# Patient Record
Sex: Male | Born: 1935 | Race: White | Hispanic: No | Smoking: Former smoker
Health system: Southern US, Community
[De-identification: ages and names within clinical notes are randomized; demographics above are authoritative.]

## PROBLEM LIST (undated history)

## (undated) DIAGNOSIS — F1020 Alcohol dependence, uncomplicated: Secondary | ICD-10-CM

## (undated) DIAGNOSIS — E860 Dehydration: Secondary | ICD-10-CM

## (undated) DIAGNOSIS — J449 Chronic obstructive pulmonary disease, unspecified: Secondary | ICD-10-CM

## (undated) DIAGNOSIS — I4719 Other supraventricular tachycardia: Secondary | ICD-10-CM

## (undated) DIAGNOSIS — T7840XA Allergy, unspecified, initial encounter: Secondary | ICD-10-CM

## (undated) DIAGNOSIS — I10 Essential (primary) hypertension: Secondary | ICD-10-CM

## (undated) DIAGNOSIS — G20 Parkinson's disease: Secondary | ICD-10-CM

## (undated) DIAGNOSIS — C61 Malignant neoplasm of prostate: Secondary | ICD-10-CM

## (undated) DIAGNOSIS — R296 Repeated falls: Secondary | ICD-10-CM

## (undated) DIAGNOSIS — M109 Gout, unspecified: Secondary | ICD-10-CM

## (undated) DIAGNOSIS — I951 Orthostatic hypotension: Secondary | ICD-10-CM

## (undated) DIAGNOSIS — G20A1 Parkinson's disease without dyskinesia, without mention of fluctuations: Secondary | ICD-10-CM

## (undated) DIAGNOSIS — M1990 Unspecified osteoarthritis, unspecified site: Secondary | ICD-10-CM

## (undated) DIAGNOSIS — E7800 Pure hypercholesterolemia, unspecified: Secondary | ICD-10-CM

## (undated) DIAGNOSIS — E46 Unspecified protein-calorie malnutrition: Secondary | ICD-10-CM

## (undated) DIAGNOSIS — H269 Unspecified cataract: Secondary | ICD-10-CM

## (undated) DIAGNOSIS — G621 Alcoholic polyneuropathy: Secondary | ICD-10-CM

## (undated) DIAGNOSIS — D649 Anemia, unspecified: Secondary | ICD-10-CM

## (undated) DIAGNOSIS — H3320 Serous retinal detachment, unspecified eye: Secondary | ICD-10-CM

## (undated) DIAGNOSIS — K4090 Unilateral inguinal hernia, without obstruction or gangrene, not specified as recurrent: Secondary | ICD-10-CM

## (undated) DIAGNOSIS — W19XXXA Unspecified fall, initial encounter: Secondary | ICD-10-CM

## (undated) DIAGNOSIS — I471 Supraventricular tachycardia: Secondary | ICD-10-CM

## (undated) HISTORY — DX: Supraventricular tachycardia: I47.1

## (undated) HISTORY — PX: COLONOSCOPY: SHX174

## (undated) HISTORY — DX: Gout, unspecified: M10.9

## (undated) HISTORY — PX: COLONOSCOPY W/ POLYPECTOMY: SHX1380

## (undated) HISTORY — DX: Unspecified cataract: H26.9

## (undated) HISTORY — DX: Unilateral inguinal hernia, without obstruction or gangrene, not specified as recurrent: K40.90

## (undated) HISTORY — DX: Parkinson's disease without dyskinesia, without mention of fluctuations: G20.A1

## (undated) HISTORY — DX: Other supraventricular tachycardia: I47.19

## (undated) HISTORY — PX: PROSTATE SURGERY: SHX751

## (undated) HISTORY — PX: HERNIA REPAIR: SHX51

## (undated) HISTORY — DX: Pure hypercholesterolemia, unspecified: E78.00

## (undated) HISTORY — DX: Unspecified osteoarthritis, unspecified site: M19.90

## (undated) HISTORY — DX: Chronic obstructive pulmonary disease, unspecified: J44.9

## (undated) HISTORY — DX: Essential (primary) hypertension: I10

## (undated) HISTORY — DX: Malignant neoplasm of prostate: C61

## (undated) HISTORY — DX: Parkinson's disease: G20

## (undated) HISTORY — DX: Serous retinal detachment, unspecified eye: H33.20

## (undated) HISTORY — DX: Allergy, unspecified, initial encounter: T78.40XA

## (undated) HISTORY — PX: CATARACT EXTRACTION: SUR2

---

## 2000-07-17 ENCOUNTER — Ambulatory Visit (HOSPITAL_COMMUNITY): Admission: RE | Admit: 2000-07-17 | Discharge: 2000-07-17 | Payer: Self-pay | Admitting: Gastroenterology

## 2000-07-17 ENCOUNTER — Encounter (INDEPENDENT_AMBULATORY_CARE_PROVIDER_SITE_OTHER): Payer: Self-pay

## 2001-06-16 ENCOUNTER — Encounter: Admission: RE | Admit: 2001-06-16 | Discharge: 2001-06-16 | Payer: Self-pay | Admitting: General Surgery

## 2001-06-16 ENCOUNTER — Encounter: Payer: Self-pay | Admitting: General Surgery

## 2001-06-18 ENCOUNTER — Ambulatory Visit (HOSPITAL_BASED_OUTPATIENT_CLINIC_OR_DEPARTMENT_OTHER): Admission: RE | Admit: 2001-06-18 | Discharge: 2001-06-18 | Payer: Self-pay | Admitting: General Surgery

## 2006-03-04 ENCOUNTER — Ambulatory Visit (HOSPITAL_COMMUNITY): Admission: RE | Admit: 2006-03-04 | Discharge: 2006-03-04 | Payer: Self-pay | Admitting: Ophthalmology

## 2008-02-11 ENCOUNTER — Encounter: Admission: RE | Admit: 2008-02-11 | Discharge: 2008-02-29 | Payer: Self-pay | Admitting: Family Medicine

## 2009-05-30 ENCOUNTER — Encounter: Admission: RE | Admit: 2009-05-30 | Discharge: 2009-05-30 | Payer: Self-pay | Admitting: Specialist

## 2009-08-17 ENCOUNTER — Encounter: Admission: RE | Admit: 2009-08-17 | Discharge: 2009-08-17 | Payer: Self-pay | Admitting: Family Medicine

## 2010-01-05 ENCOUNTER — Ambulatory Visit (HOSPITAL_BASED_OUTPATIENT_CLINIC_OR_DEPARTMENT_OTHER): Admission: RE | Admit: 2010-01-05 | Discharge: 2010-01-06 | Payer: Self-pay | Admitting: Specialist

## 2010-07-19 ENCOUNTER — Ambulatory Visit (HOSPITAL_COMMUNITY): Admission: RE | Admit: 2010-07-19 | Discharge: 2010-07-19 | Payer: Self-pay | Admitting: Gastroenterology

## 2011-02-07 LAB — POCT I-STAT, CHEM 8
BUN: 15 mg/dL (ref 6–23)
Chloride: 106 mEq/L (ref 96–112)
Potassium: 4.1 mEq/L (ref 3.5–5.1)
Sodium: 139 mEq/L (ref 135–145)

## 2011-04-05 NOTE — Op Note (Signed)
NAME:  Victor Clarke, Victor Clarke               ACCOUNT NO.:  [id number]   MEDICAL RECORD NO.:  [id number]          PATIENT TYPE:  AMB   LOCATION:  SDS                          FACILITY:  [facility]   PHYSICIAN:  Alford Highland. Rankin, M.D.   DATE OF BIRTH:  [birth year]   DATE OF PROCEDURE:  03/04/2006  DATE OF DISCHARGE:                                 OPERATIVE REPORT   PREOPERATIVE DIAGNOSIS:  Rhegmatogenous retinal detachment, right eye,  macula on.   POSTOPERATIVE DIAGNOSIS:  Rhegmatogenous retinal detachment, right eye,  macula on.   OPERATION PERFORMED:  1.  Scleral buckle using 287 solid silicone explant, right eye.  2.  Injection of vitreous substitute, SF6 100% concentration, volume 0.2 mL.  3.  External drainage of subretinal fluid, right eye.  4.  Cryopexy, right eye.  Repair of retinal detachment.   SURGEON:  Alford Highland. Rankin, M.D.   ANESTHESIA:  Local retrobulbar with monitored anesthesia control.   INDICATIONS FOR PROCEDURE:  The patient is a 77 year old man who has macula  on rhegmatogenous detachment who requires scleral buckle to repair.  The  patient understands the risks of anesthesia including the rare occurrence of  death but also understands risks to the eye both surgical and from the  healing and the underlying process including but not limited to hemorrhage,  infection, scarring, need for another surgery, no change in vision, loss of  vision and progression of disease despite intervention.  After appropriate  signed consent was obtained, the patient was taken to the operating room.   DESCRIPTION OF PROCEDURE:  In the operating room, appropriate monitoring was  followed by mild sedation.  2% Xylocaine with no epinephrine was injected in  retrobulbar fashion followed by an additional 5 mL laterally in the fashion  of modified Darel Hong.  The  periocular region was sterilely prepped and  draped in the usual ophthalmic fashion.  A lid speculum was applied.  Conjunctival peritomy was  fashioned from the 530 position temporally to the  12:30 position.  Three quadrants show each of the quadrants were then  entered to break the intermuscular septum.  The superior lateral and  inferior rectus muscles were isolated on 2-0 silk ties.  Retinal cryopexy  was applied to the retinal tear found at the 9 o'clock position with the  adjacent pigmentary changes.  Detachment extended from the 8 o'clock to the  10 o'clock position. Scleral buckle 287 solid silicone explant was then  selected to support the retinal break and the volume of the detachment.  This was placed and temporarily tied with 5-0 Mersilene mattress sutures  astride and underneath the lateral rectus muscle.  External drainage of  subretinal fluid was deemed necessary and this was carried out with direct  visualization via the head scope.  Subretinal fluid was removed in this  fashion without difficulty.  Intraocular pressure was maintained constantly.  Thereafter the buckle was then secured and tightened to the appropriate  height with permanent 5-0 Mersilene mattress sutures. Two were placed in  superotemporal quadrant, one in the inferotemporal quadrant.  A small fold  through  the area of the retinal break was noted.  It was indeed possible  that this might keep the break from flattening on the buckle.  For this  reason, it was necessary to use intraocular vitreous substitute--limited  amount to flatten this area.   Conjunctiva and Tenon's brought forward and closed with 7-0 Vicryl suture.  The bed of the buckle irrigated.  Subconjunctival injection of antibiotic  and steroid applied.  0.2 mL of filtered SF6 100% concentration was then  placed via the inferior quadrant in direct visualization. The optic nerve  remained perfused. The intraocular pressure was normal.   At this time the patient was placed in the left side down position.  The  patient tolerated the procedure well without complication.  A sterile  patch  and Fox shield were applied.  The patient was then transferred to PACU in  good and stable condition.      Alford Highland Rankin, M.D.  Electronically Signed     GAR/MEDQ  D:  03/04/2006  T:  03/05/2006  Job:  [id number]

## 2011-04-05 NOTE — Procedures (Signed)
[facility]  Patient:    Victor Clarke, Victor Clarke                        MRN: [id number] Proc. Date: 07/17/00 Attending:  Verlin Grills, M.D. CC:         Al Decant. Janey Greaser, M.D.   Procedure Report  PROCEDURE:  Colonoscopy and small colon biopsy.  REFERRING PHYSICIAN:  Al Decant. Janey Greaser, M.D.  INDICATION FOR PROCEDURE:  [name] is a 77 year old male referred for diagnostic colonoscopy to evaluate painless hematochezia which has resolved. In mid 1980, [name] tells me had had colon polyps removed. In 1996, his flexible proctosigmoidoscopy--barium enema examination was apparently normal.  [name] viewed our colonoscopy education film. I discussed with the patient the complications associated with colonoscopy and polypectomy including intestinal bleeding and intestinal perforation. The patient has signed the operative permit.  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 Boston Service in 1993.  ENDOSCOPIST:  Verlin Grills, M.D.  PREMEDICATION:  Demerol 50 mg, Versed 10 mg.  ENDOSCOPE:  Olympus pediatric colonoscope.  DESCRIPTION OF PROCEDURE:  After obtaining informed consent, the patient was placed in the left lateral decubitus position. I administered intravenous Demerol and intravenous Versed to achieve conscious sedation for the procedure. The patients cardiac rhythm, oxygen saturation and blood pressure were monitored throughout the procedure and documented in the medical record.  Anal inspection was normal. Digital rectal exam reveals an absent prostate. The Olympus pediatric video colonoscope was introduced into the rectum and under direct vision advanced to the cecum identified by a normal appearing ileocecal valve. Colonic preparation for the exam today was excellent.  RECTUM:  Large nonbleeding internal hemorrhoids.  SIGMOID COLON/DESCENDING COLON:   Normal.  SPLENIC FLEXURE:  Normal.  TRANSVERSE COLON:  Normal.  HEPATIC FLEXURE:  Normal.  ASCENDING COLON:  Normal.  CECUM/ILEOCECAL VALVE:  In the proximal cecum, a 1 mm sessile polyp was removed with the cold biopsy forceps and submitted for pathological interpretation.  ASSESSMENT: 1. Large nonbleeding internal hemorrhoids. 2. From the proximal cecum, a 1 mm sessile polyp was removed with the cold    biopsy forceps.  RECOMMENDATIONS:  If the cecal polyp returns neoplastic, [name] should undergo a repeat colonoscopy in 5 years; if the cecal polyp is nonneoplastic, [name] should undergo a repeat colonoscopy in 10 years. DD:  07/17/00 TD:  07/17/00 Job: [id number]

## 2011-04-05 NOTE — Op Note (Signed)
[facility]  Patient:    Victor Clarke, Victor Clarke                        MRN: [id number] Proc. Date: 06/18/01 Attending:  Anselm Pancoast. Zachery Dakins, M.D.                           Operative Report  PREOPERATIVE DIAGNOSIS:  Left inguinal hernia probably direct.  POSTOPERATIVE DIAGNOSIS:  Left inguinal hernia combination indirect and direct.  OPERATION:  SURGEON:  Anselm Pancoast. Zachery Dakins, M.D.  ANESTHESIA:  Local anesthesia with sedation.  HISTORY:  Victor Clarke is a 77 year old Caucasian male who I have cared for his wife, she is a dialysis patient for long term and he presented recently with a bulge in the left groin that has been increasing in size over the last couple of years.  The patient does a lot of lifting in caring for his wife and has had previous colonoscopy and because of the hernia increasing in size his medical physicians have recommended that he have it repaired.  PHYSICAL EXAMINATION:  On exam, he has got an obvious bulge about the size of an orange in the left groin.  DESCRIPTION OF PROCEDURE:  The patient was taken to the operative suite, an IV had been started and he was given a gram of Kefzol and then induction of sedation and then the area first was clipped prepped with Betadine surgical scrub solution and draped in a sterile manner.  The inguinal incision area was infiltrated with a mixture of 0.25% plain xylocaine and 0.25 Marcaine with adrenalin.  The ilioinguinal nerve areas anesthetized with a blunt 22 gauze needle.  In all total about 30 cc of the solution used.  Sharp dissection down through the skin, subcutaneous tissue.  Three superficial veins were clamped, divided and ligated with fine Vicryl and then the external oblique was opened through the external ring.  Had a difficult time, actually truly identifying the ilioinguinal nerve because the patient appears that he has an indirect component coming down with the cord  structures but then the floor had completely given away and over a period of time this has sort stretched and distorted the tissue significantly.  I first opened up the cord structures and separated out about a 4 inch well-developed hernia sac.  The preperitoneal fatty tissue and omentum that was down in this was reduced and then under direct vision, high sac ligation was performed with 0 Surgilon.  Next, the floor, I reinforced it with kind of a modified Shouldice type dissection.  The superior flap being sutured down into the shelving edge of the inguinal ligament and then resected running back, tying the two tails of the 2-0 Prolene together.  This recreated the internal ring and then a piece of Prolene mesh shaped like a sail slit laterally was used to reinforce the floor.  Suturing it, starting at the symphysis pubis with the inferior with continuous 2-0 Prolene and the superior flaps kind of interrupted sutures of 2-0 Prolene.  The two tails were then sutured together laterally to reinforce the internal ring.  I did not tighten up the internal ring real tight since he had had a combination, I did not want to get any swelling of the testicle. The ilioinguinal nerve and the iliohypogastric area, I think I protected both. Once was placed with the cord structures and the other was lying kind of superior  to the mesh.  The cord structures were placed back in their normal position and the external oblique was closed with 2-0 Vicryl and then 3-0 Vicryl in Scarpas and 3-0 Vicryl subcuticular and Benzoin and Steri-Strips on the skin.  The patient tolerated the procedure nicely and was awakened at the completion of surgery.  He has made arrangements for no lifting as far as his wife for the next four or five days and hopefully will be able to be her main caregiver next week.  I will see him in the office in one week and he is aware that he really should not be lifting over 30 to 40 pounds for the  next three to four weeks. DD:  06/18/01 TD:  06/18/01 Job: [id number]

## 2014-08-17 ENCOUNTER — Ambulatory Visit: Payer: Medicare Other | Admitting: Neurology

## 2014-08-18 ENCOUNTER — Encounter: Payer: Self-pay | Admitting: Neurology

## 2014-08-18 ENCOUNTER — Encounter (INDEPENDENT_AMBULATORY_CARE_PROVIDER_SITE_OTHER): Payer: Self-pay

## 2014-08-18 ENCOUNTER — Ambulatory Visit (INDEPENDENT_AMBULATORY_CARE_PROVIDER_SITE_OTHER): Payer: Medicare Other | Admitting: Neurology

## 2014-08-18 VITALS — BP 149/85 | HR 84 | Ht 69.0 in | Wt 149.4 lb

## 2014-08-18 DIAGNOSIS — G20A1 Parkinson's disease without dyskinesia, without mention of fluctuations: Secondary | ICD-10-CM

## 2014-08-18 DIAGNOSIS — H814 Vertigo of central origin: Secondary | ICD-10-CM

## 2014-08-18 DIAGNOSIS — R269 Unspecified abnormalities of gait and mobility: Secondary | ICD-10-CM

## 2014-08-18 DIAGNOSIS — R42 Dizziness and giddiness: Secondary | ICD-10-CM

## 2014-08-18 DIAGNOSIS — Z79899 Other long term (current) drug therapy: Secondary | ICD-10-CM

## 2014-08-18 DIAGNOSIS — G20 Parkinson's disease: Secondary | ICD-10-CM

## 2014-08-18 DIAGNOSIS — H8149 Vertigo of central origin, unspecified ear: Secondary | ICD-10-CM

## 2014-08-18 MED ORDER — CARBIDOPA-LEVODOPA 25-100 MG PO TABS: 0.5000 | ORAL_TABLET | Freq: Three times a day (TID) | ORAL | Status: DC

## 2014-08-18 NOTE — Patient Instructions (Addendum)
As far as your medications are concerned, I would like to suggest Sinemet (Carbidopa/Levodopa) 25/100 mg, one-half tablet  three times daily. Take at 8am, noon and 4pm.  Try to separate Sinemet from food (especially protein-rich foods like meat, dairy, eggs) by about 30-60 mins - this will help the absorption of the medication. If you have some nausea with the medication, you can take it with some light food like crackers or ginger ale.  There is an excellent Parkinson's support group in [place]. It is called "[employer] Parkinson[employer]" and meets once a month. Please call Amy Marriot or Vianne Bulls at [phone] for more information. There is no charge/fee for joining the group.   I would like to see you back in 6 weeks, sooner if we need to. Please call us with any interim questions, concerns, problems, updates or refill requests.   Please also call us for any test results so we can go over those with you on the phone.  My clinical assistant and will answer any of your questions and relay your messages to me and also relay most of my messages to you.   Our phone number is [phone]. We also have an after hours call service for urgent matters and there is a physician on-call for urgent questions. For any emergencies you know to call 911 or go to the nearest emergency room

## 2014-08-18 NOTE — Progress Notes (Addendum)
[facility]    Provider:  Dr Jaynee Eagles Referring Provider: No ref. provider found Primary Care Physician:  Milagros Evener, MD  CC:  Dizziness, balance difficulty  HPI:  Victor Clarke is a 80 y.o. male here as a referral from Dr. No ref. provider found for gait and balance difficulty.   Patient is a poor historian, can't really tell me why he is here. Review of referring physician's notes document possible parkinsonism. Patient reports dizziness. He is most dizzy in the morning, getting up in the morning he has to be very careful that he doesn't move around too quickly. Clears up immediately and ok for the rest of the day. Dizziness just when he gets up out of bed. Brief dizziness. Better if he sits and gets up slowly. No dizziness during the day, takes time getting up out of seats. No room spinning. No dry mouth or constipation. Not sleeping well but can't explain why. Denies snoring, not actng out dreams, tires more easily with physical activity, sometimes having difficulty getitng to sleep. No falls during the day. No numbness or tingling in the feet.  Denies falls, last time he fell was " a while ago" but can't tell me anymore. When asked about his tremor he states that the resting tremor started several months ago. He is having difficulty writing, getting shaky. sense of smell is ok.   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 Lives alone, no children and has been discussing his living situation with Dr. Haynes Dage regarding moving closer to his brother and into a retirement community. Cbc significant for MCV 103.6, CMP unremarkable,   Review of Systems: Patient complains of symptoms per HPI as well as the following symptoms: dizziness, denies CP and palpitations. Pertinent negatives per HPI. All others negative.   History   Social History   . Marital Status: Widowed    Spouse Name: N/A    Number of Children: 0  . Years of Education: College   Occupational History  . Retired Other   Social History Main Topics  . Smoking status: Former Smoker -- 2.00 packs/day for 22 years    Types: Cigarettes    Quit date: 11/18/1977  . Smokeless tobacco: Never Used  . Alcohol Use: Yes     Comment: 2 drinks daily  . Drug Use: No  . Sexual Activity: Not on file   Other Topics Concern  . Not on file   Social History Narrative   Patient lives at home alone.   Caffeine Use: 1 cup daily    Family History  Problem Relation Age of Onset  . Stroke Father   . Heart attack Mother     Past Medical History  Diagnosis Date  . Hypertension   . Hypercholesteremia   . Gout   . COPD (chronic obstructive pulmonary disease)   . Detached retina   . Prostate cancer     Past Surgical History  Procedure Laterality Date  . Colonoscopy w/ polypectomy  2001, 2006  . Hernia repair    . Prostate surgery      Current Outpatient Prescriptions  Medication Sig Dispense Refill  . allopurinol (ZYLOPRIM) 100 MG tablet Take 200 mg by mouth daily.      Marland Kitchen aspirin 81 MG tablet Take 81 mg by mouth daily.      Marland Kitchen atorvastatin (LIPITOR) 40 MG tablet  Take 40 mg by mouth daily.      Marland Kitchen lisinopril (PRINIVIL,ZESTRIL) 20 MG tablet Take 20 mg by mouth daily.      . meclizine (ANTIVERT) 25 MG tablet Take 25 mg by mouth every 8 (eight) hours as needed for dizziness.      . Omega-3 Fatty Acids (FISH OIL) 1200 MG CAPS Take 1 capsule by mouth daily.      . carbidopa-levodopa (SINEMET) 25-100 MG per tablet Take 0.5 tablets by mouth 3 (three) times daily.  45 tablet  3   No current facility-administered medications for this visit.    Allergies as of 08/18/2014  . (No Known Allergies)    Vitals:149/85 p84 standing Ht 5\' 9"  (1.753 m)  Wt 149 lb 6.4 oz (67.767 kg)  BMI 22.05 kg/m2 Last Weight:  Wt Readings from Last 1 Encounters:  08/18/14 149 lb 6.4 oz  (67.767 kg)   Last Height:   Ht Readings from Last 1 Encounters:  08/18/14 5\' 9"  (1.753 m)    Physical exam: Exam: Gen: NAD, conversant, well nourised, well groomed                     CV: RRR, no MRG. No Carotid Bruits. No peripheral edema, warm, nontender Eyes: Conjunctivae clear without exudates or hemorrhage  Neuro: Detailed Neurologic Exam  Speech:    Speech is normal; fluent and spontaneous with normal comprehension.  Cognition:    The patient is oriented to person, place, and time;  Cranial Nerves:    The pupils are equal, round, and reactive to light. Cannot visualize fundi. Visual fields are full to finger confrontation. Extraocular movements are intact. Trigeminal sensation is intact and the muscles of mastication are normal. The face is symmetric. The palate elevates in the midline. Voice is normal. Shoulder shrug is normal. The tongue has normal motion without fasciculations.   Coordination:    Normal finger to nose and heel to shin. Good RAM bilaterally, finger tap. Difficulty with tandem.  Gait:    Good stride, turn. Mildly reduced arm swing.  Motor Observation:    Left > right high-amplitude low-frequency resting and postural tremor Tone:    Left arm and hand cogwheeling, increased with facilitation  Posture:    Posture is normal.     Strength:    Strength is V/V in the upper and lower limbs.      Sensation: intact to LT     Reflex Exam:  DTR's:    Brisk throughout Toes:    The toes are downgoing bilaterally.   Clonus:    Clonus is absent.       Assessment/Plan:  80 year old male who is referred from his physician for balance and gait evaluation. He is a poor historian and can't tell me in detail about his symptoms. He demonstrates a left>right high amlitude, low frequency resting and postural tremor tremor, masked facies, left arm cogwheeling which worsens with facilitation and brisk reflexes throughout. Most likely Parkinson's Disease. Will  start Sinemet 25/100 take 1/2 pill at 8am, noon and 4pm. Will have patient back in 6 weeks and can increase the medication if tolerating it well. Discussed common side effects and discussed trying to try to separate Sinemet from food (especially protein-rich foods like meat, dairy, eggs) by about 30-60 mins to help the absorption of the medication. If nausea with the medication, can take it with some light food like crackers or ginger ale. Would be helpful for patient to be  evaluated by physical therapy evaluation. There is an excellent Parkinson's support group in [place]. It is called "[employer]'s" and meets once a month. Please call Amy Marriot or Vianne Bulls at [phone] for more information. There is no charge/fee for joining the group.   Also with dizziness, worsening. Advised patient to wear compression stockings and gradually rise from a seated position. May be a component of Parkinson's disease.    10/23 addendum: Checked up on patient. Patient reports getting worse, worse dizziness and room spinning not associated with head turning or with being in bed or upon standing. Need to order MRI of the brain to rule out vertigo of central origin such as posterior circulation strokes. MRI of the brain ordered which will also be helpful in examining his cognitive decline.   Addendum 08/29/14: MMSE 24/30 with loss of following points, sent by primary care dr rankins.   Orientation: floor of building -1 Attention and calculation -1 Can't repeat 3 words to remember -3 Copy design -[place], MD  [facility] [address] [place], [place] [address]  Phone [phone] Fax [phone]

## 2014-08-21 ENCOUNTER — Encounter: Payer: Self-pay | Admitting: Neurology

## 2014-08-22 ENCOUNTER — Encounter: Payer: Self-pay | Admitting: Neurology

## 2014-08-22 DIAGNOSIS — G20 Parkinson's disease: Secondary | ICD-10-CM | POA: Insufficient documentation

## 2014-08-29 ENCOUNTER — Ambulatory Visit: Payer: Medicare Other | Attending: Neurology

## 2014-09-09 NOTE — Addendum Note (Signed)
Addended by: Sarina Ill B on: 09/09/2014 05:48 PM   Modules accepted: Orders

## 2014-09-16 ENCOUNTER — Inpatient Hospital Stay (HOSPITAL_COMMUNITY): Payer: Medicare Other

## 2014-09-16 ENCOUNTER — Encounter (HOSPITAL_COMMUNITY): Payer: Self-pay | Admitting: Emergency Medicine

## 2014-09-16 ENCOUNTER — Emergency Department (HOSPITAL_COMMUNITY): Payer: Medicare Other

## 2014-09-16 ENCOUNTER — Inpatient Hospital Stay (HOSPITAL_COMMUNITY)
Admission: EM | Admit: 2014-09-16 | Discharge: 2014-09-18 | DRG: 204 | Disposition: A | Discharge status: Home / Self Care | Payer: Medicare Other | Attending: Internal Medicine | Admitting: Internal Medicine

## 2014-09-16 DIAGNOSIS — R0602 Shortness of breath: Secondary | ICD-10-CM | POA: Diagnosis present

## 2014-09-16 DIAGNOSIS — R0600 Dyspnea, unspecified: Secondary | ICD-10-CM | POA: Diagnosis not present

## 2014-09-16 DIAGNOSIS — Z66 Do not resuscitate: Secondary | ICD-10-CM | POA: Diagnosis present

## 2014-09-16 DIAGNOSIS — G20 Parkinson's disease: Secondary | ICD-10-CM | POA: Diagnosis not present

## 2014-09-16 DIAGNOSIS — I10 Essential (primary) hypertension: Secondary | ICD-10-CM | POA: Diagnosis present

## 2014-09-16 DIAGNOSIS — Z79899 Other long term (current) drug therapy: Secondary | ICD-10-CM | POA: Diagnosis not present

## 2014-09-16 DIAGNOSIS — Z8546 Personal history of malignant neoplasm of prostate: Secondary | ICD-10-CM | POA: Diagnosis not present

## 2014-09-16 DIAGNOSIS — J449 Chronic obstructive pulmonary disease, unspecified: Secondary | ICD-10-CM | POA: Diagnosis present

## 2014-09-16 DIAGNOSIS — Z87891 Personal history of nicotine dependence: Secondary | ICD-10-CM | POA: Diagnosis not present

## 2014-09-16 DIAGNOSIS — E780 Pure hypercholesterolemia: Secondary | ICD-10-CM | POA: Diagnosis present

## 2014-09-16 DIAGNOSIS — M109 Gout, unspecified: Secondary | ICD-10-CM | POA: Diagnosis present

## 2014-09-16 DIAGNOSIS — H3320 Serous retinal detachment, unspecified eye: Secondary | ICD-10-CM | POA: Diagnosis not present

## 2014-09-16 DIAGNOSIS — G20C Parkinsonism, unspecified: Secondary | ICD-10-CM | POA: Diagnosis present

## 2014-09-16 DIAGNOSIS — R0609 Other forms of dyspnea: Secondary | ICD-10-CM | POA: Diagnosis present

## 2014-09-16 DIAGNOSIS — I4891 Unspecified atrial fibrillation: Secondary | ICD-10-CM | POA: Diagnosis not present

## 2014-09-16 DIAGNOSIS — R079 Chest pain, unspecified: Secondary | ICD-10-CM

## 2014-09-16 LAB — CBC
HEMATOCRIT: 39.2 % (ref 39.0–52.0)
Hemoglobin: 13.8 g/dL (ref 13.0–17.0)
MCH: 35.1 pg — AB (ref 26.0–34.0)
MCHC: 35.2 g/dL (ref 30.0–36.0)
MCV: 99.7 fL (ref 78.0–100.0)
PLATELETS: 177 10*3/uL (ref 150–400)
RBC: 3.93 MIL/uL — ABNORMAL LOW (ref 4.22–5.81)
RDW: 14.8 % (ref 11.5–15.5)
WBC: 4.9 10*3/uL (ref 4.0–10.5)

## 2014-09-16 LAB — BASIC METABOLIC PANEL
Anion gap: 22 — ABNORMAL HIGH (ref 5–15)
BUN: 17 mg/dL (ref 6–23)
CALCIUM: 9.2 mg/dL (ref 8.4–10.5)
CHLORIDE: 100 meq/L (ref 96–112)
CO2: 19 mEq/L (ref 19–32)
CREATININE: 1.22 mg/dL (ref 0.50–1.35)
GFR calc non Af Amer: 53 mL/min — ABNORMAL LOW (ref >90)
GFR, EST AFRICAN AMERICAN: 61 mL/min — AB (ref >90)
Glucose, Bld: 127 mg/dL — ABNORMAL HIGH (ref 70–99)
Potassium: 4.3 mEq/L (ref 3.7–5.3)
Sodium: 141 mEq/L (ref 137–147)

## 2014-09-16 LAB — I-STAT TROPONIN, ED
Troponin i, poc: 0.07 ng/mL (ref 0.00–0.08)
Troponin i, poc: 0.06 ng/mL (ref 0.00–0.08)

## 2014-09-16 LAB — D-DIMER, QUANTITATIVE: D-Dimer, Quant: 0.64 ug/mL-FEU — ABNORMAL HIGH (ref 0.00–0.48)

## 2014-09-16 LAB — PRO B NATRIURETIC PEPTIDE: Pro B Natriuretic peptide (BNP): 2391 pg/mL — ABNORMAL HIGH (ref 0–450)

## 2014-09-16 LAB — TROPONIN I

## 2014-09-16 MED ORDER — DOCUSATE SODIUM 100 MG PO CAPS
100.0000 mg | ORAL_CAPSULE | Freq: Two times a day (BID) | ORAL | Status: DC
  Administered 2014-09-16 – 2014-09-18 (×4): 100 mg via ORAL
  Filled 2014-09-16 (×5): qty 1

## 2014-09-16 MED ORDER — ACETAMINOPHEN 650 MG RE SUPP: 650.0000 mg | Freq: Four times a day (QID) | RECTAL | Status: DC | PRN

## 2014-09-16 MED ORDER — ONDANSETRON HCL 4 MG/2ML IJ SOLN: 4.0000 mg | Freq: Four times a day (QID) | INTRAMUSCULAR | Status: DC | PRN

## 2014-09-16 MED ORDER — ONDANSETRON HCL 4 MG PO TABS: 4.0000 mg | ORAL_TABLET | Freq: Four times a day (QID) | ORAL | Status: DC | PRN

## 2014-09-16 MED ORDER — SODIUM CHLORIDE 0.9 % IV SOLN
INTRAVENOUS | Status: AC
Start: 2014-09-16 — End: 2014-09-17
  Administered 2014-09-16: 23:00:00 via INTRAVENOUS

## 2014-09-16 MED ORDER — CARBIDOPA-LEVODOPA 25-100 MG PO TABS
0.5000 | ORAL_TABLET | Freq: Three times a day (TID) | ORAL | Status: DC
  Administered 2014-09-16 – 2014-09-18 (×5): 0.5 via ORAL
  Filled 2014-09-16 (×7): qty 0.5

## 2014-09-16 MED ORDER — ASPIRIN EC 81 MG PO TBEC
81.0000 mg | DELAYED_RELEASE_TABLET | Freq: Every day | ORAL | Status: DC
  Administered 2014-09-17 – 2014-09-18 (×2): 81 mg via ORAL
  Filled 2014-09-16 (×2): qty 1

## 2014-09-16 MED ORDER — LISINOPRIL 20 MG PO TABS
20.0000 mg | ORAL_TABLET | Freq: Every day | ORAL | Status: DC
  Administered 2014-09-16 – 2014-09-18 (×3): 20 mg via ORAL
  Filled 2014-09-16 (×3): qty 1

## 2014-09-16 MED ORDER — ACETAMINOPHEN 325 MG PO TABS: 650.0000 mg | ORAL_TABLET | Freq: Four times a day (QID) | ORAL | Status: DC | PRN

## 2014-09-16 MED ORDER — HYDROCODONE-ACETAMINOPHEN 5-325 MG PO TABS: 1.0000 | ORAL_TABLET | ORAL | Status: DC | PRN

## 2014-09-16 MED ORDER — ALLOPURINOL 100 MG PO TABS
100.0000 mg | ORAL_TABLET | Freq: Every day | ORAL | Status: DC
  Administered 2014-09-16 – 2014-09-18 (×3): 100 mg via ORAL
  Filled 2014-09-16 (×3): qty 1

## 2014-09-16 MED ORDER — MECLIZINE HCL 12.5 MG PO TABS
12.5000 mg | ORAL_TABLET | Freq: Three times a day (TID) | ORAL | Status: DC | PRN
  Filled 2014-09-16: qty 1

## 2014-09-16 MED ORDER — ENOXAPARIN SODIUM 40 MG/0.4ML ~~LOC~~ SOLN
40.0000 mg | SUBCUTANEOUS | Status: DC
  Administered 2014-09-16 – 2014-09-17 (×2): 40 mg via SUBCUTANEOUS
  Filled 2014-09-16 (×3): qty 0.4

## 2014-09-16 MED ORDER — ATORVASTATIN CALCIUM 40 MG PO TABS
40.0000 mg | ORAL_TABLET | Freq: Every day | ORAL | Status: DC
  Administered 2014-09-16 – 2014-09-18 (×3): 40 mg via ORAL
  Filled 2014-09-16 (×3): qty 1

## 2014-09-16 MED ORDER — METOPROLOL TARTRATE 12.5 MG HALF TABLET
12.5000 mg | ORAL_TABLET | Freq: Two times a day (BID) | ORAL | Status: DC
  Administered 2014-09-16 – 2014-09-18 (×4): 12.5 mg via ORAL
  Filled 2014-09-16 (×5): qty 1

## 2014-09-16 MED ORDER — IOHEXOL 350 MG/ML SOLN
100.0000 mL | Freq: Once | INTRAVENOUS | Status: AC | PRN
  Administered 2014-09-16: 100 mL via INTRAVENOUS

## 2014-09-16 NOTE — ED Notes (Signed)
Ordered [facility] diet

## 2014-09-16 NOTE — ED Notes (Signed)
Pt from [place] MD office for sob and dizziness. The dizziness he reports off and on for the past month and the SOB started this week. Pt denies any pain. Equal grips, denies any facial droop, gait abnormality.

## 2014-09-16 NOTE — Progress Notes (Signed)
Admitted pt to rm 3E08 from ED, oriented to room, call bell placed within reach, admission done, orders carried out. Will continue to monitor.  09/16/14 2102  Vitals  Temp 98.2 F (36.8 C)  Temp Source Oral  BP 133/79 mmHg  BP Location Left Arm  BP Method Automatic  Patient Position (if appropriate) Lying  Pulse Rate 92  Resp 20  Oxygen Therapy  SpO2 100 %  O2 Device Room Air  Height and Weight  Height 5\' 9"  (1.753 m)  Weight 63.9 kg (140 lb 14 oz)  Type of Scale Used Standing (A Scale)  Type of Weight Actual  BSA (Calculated - sq m) 1.76 sq meters  BMI (Calculated) 20.8  Weight in (lb) to have BMI = 25 168.9

## 2014-09-16 NOTE — ED Provider Notes (Signed)
CSN: [id number]     Arrival date & time 09/16/14  1412 History   First MD Initiated Contact with Patient 09/16/14 1514     Chief Complaint  Patient presents with  . Shortness of Breath  . Dizziness     (Consider location/radiation/quality/duration/timing/severity/associated sxs/prior Treatment) Patient is a 80 y.o. male presenting with shortness of breath and dizziness. The history is provided by the patient. No language interpreter was used.  Shortness of Breath Severity:  Moderate Onset quality:  Gradual Duration:  1 week Timing:  Intermittent Progression:  Waxing and waning Chronicity:  New Context: activity   Context: not URI   Relieved by:  Rest Worsened by:  Exertion Associated symptoms: no abdominal pain, no chest pain, no cough, no diaphoresis, no fever, no headaches, no hemoptysis, no PND, no sputum production, no syncope and no wheezing   Risk factors: no hx of cancer, no hx of PE/DVT, no prolonged immobilization and no tobacco use   Dizziness Quality:  Lightheadedness Severity:  Moderate Onset quality:  Sudden Duration:  3 weeks Timing:  Intermittent Progression:  Waxing and waning (worse with standing in AM) Chronicity:  New Context: standing up   Context: not with head movement and not with loss of consciousness   Relieved by:  Nothing Worsened by:  Standing up Ineffective treatments:  Medication (meclizine) Associated symptoms: shortness of breath   Associated symptoms: no blood in stool, no chest pain, no headaches, no nausea, no palpitations, no syncope, no vision changes and no weakness   Risk factors: new medications   Risk factors: no hx of stroke and no hx of vertigo     Past Medical History  Diagnosis Date  . Hypertension   . Hypercholesteremia   . Gout   . COPD (chronic obstructive pulmonary disease)   . Detached retina   . Prostate cancer    Past Surgical History  Procedure Laterality Date  . Colonoscopy w/ polypectomy  2001, 2006  .  Hernia repair    . Prostate surgery     Family History  Problem Relation Age of Onset  . Stroke Father   . Heart attack Mother    History  Substance Use Topics  . Smoking status: Former Smoker -- 2.00 packs/day for 22 years    Types: Cigarettes    Quit date: 11/18/1977  . Smokeless tobacco: Never Used  . Alcohol Use: Yes     Comment: 2 drinks daily    Review of Systems  Constitutional: Negative for fever and diaphoresis.  Eyes: Negative for visual disturbance.  Respiratory: Positive for shortness of breath. Negative for cough, hemoptysis, sputum production, chest tightness and wheezing.   Cardiovascular: Negative for chest pain, palpitations, leg swelling, syncope and PND.  Gastrointestinal: Negative for nausea, abdominal pain and blood in stool.  Musculoskeletal: Negative for gait problem.  Neurological: Positive for dizziness. Negative for seizures, syncope, facial asymmetry, speech difficulty, weakness and headaches.  Hematological: Does not bruise/bleed easily.  All other systems reviewed and are negative.     Allergies  Review of patient's allergies indicates no known allergies.  Home Medications   Prior to Admission medications   Medication Sig Start Date End Date Taking? Authorizing Provider  allopurinol (ZYLOPRIM) 100 MG tablet Take 100 mg by mouth daily.    Yes Historical Provider, MD  aspirin EC 81 MG tablet Take 81 mg by mouth daily.   Yes Historical Provider, MD  atorvastatin (LIPITOR) 40 MG tablet Take 40 mg by mouth daily.  Yes Historical Provider, MD  carbidopa-levodopa (SINEMET) 25-100 MG per tablet Take 0.5 tablets by mouth 3 (three) times daily. 08/18/14  Yes Melvenia Beam, MD  lisinopril (PRINIVIL,ZESTRIL) 20 MG tablet Take 20 mg by mouth daily.   Yes Historical Provider, MD  meclizine (ANTIVERT) 25 MG tablet Take 12.5 mg by mouth every 8 (eight) hours as needed for dizziness.    Yes Historical Provider, MD  Omega-3 Fatty Acids (FISH OIL) 1200 MG CAPS  Take 2,400 mg by mouth daily.   Yes Historical Provider, MD   BP 148/97  Pulse 93  Temp(Src) 98.3 F (36.8 C) (Oral)  Resp 18  Ht 5\' 9"  (1.753 m)  Wt 144 lb (65.318 kg)  BMI 21.26 kg/m2  SpO2 100% Physical Exam  Vitals reviewed. Constitutional: He appears well-developed and well-nourished. No distress.  HENT:  Head: Normocephalic.  Eyes: EOM are normal. Pupils are equal, round, and reactive to light.  Neck: No JVD present.  Cardiovascular: Normal rate, regular rhythm and normal heart sounds.   Pulmonary/Chest: Effort normal and breath sounds normal.  Abdominal: Soft. He exhibits no distension. There is no tenderness.  Musculoskeletal: He exhibits no edema.  Neurological: He has normal strength and normal reflexes. He displays tremor. No cranial nerve deficit or sensory deficit. He displays a negative Romberg sign. Gait normal. GCS eye subscore is 4. GCS verbal subscore is 5. GCS motor subscore is 6.  Skin: Skin is warm.    ED Course  Procedures (including critical care time) Labs Review Labs Reviewed  CBC - Abnormal; Notable for the following:    RBC 3.93 (*)    MCH 35.1 (*)    All other components within normal limits  BASIC METABOLIC PANEL  PRO B NATRIURETIC PEPTIDE  I-STAT TROPOININ, ED    Imaging Review Dg Chest 2 View  09/16/2014   CLINICAL DATA:  80 year old male with shortness of breath for several days with dizziness and chest pain. Initial encounter.  EXAM: CHEST  2 VIEW  COMPARISON:  1453 hr the same day [facility] physicians exam) and earlier.  FINDINGS: Stable large lung volumes. Stable cardiac size and mediastinal contours. Chronic increased interstitial markings. No pneumothorax, pulmonary edema, pleural effusion or acute pulmonary opacity. No acute osseous abnormality identified. Calcified atherosclerosis of the aorta.  IMPRESSION: Chronic lung disease.  No acute cardiopulmonary abnormality.   Electronically Signed   By: Lars Pinks M.D.   On: 09/16/2014 16:12    Ct Angio Chest Pe W/cm &/or Wo Cm  09/17/2014   CLINICAL DATA:  Shortness of breath.  Dyspnea.  EXAM: CT ANGIOGRAPHY CHEST WITH CONTRAST  TECHNIQUE: Multidetector CT imaging of the chest was performed using the standard protocol during bolus administration of intravenous contrast. Multiplanar CT image reconstructions and MIPs were obtained to evaluate the vascular anatomy.  CONTRAST:  156mL OMNIPAQUE IOHEXOL 350 MG/ML SOLN  COMPARISON:  Chest x-ray dated 09/16/2014  FINDINGS: There are no pulmonary emboli, infiltrates, or pleural effusions. Patient has extensive emphysematous disease as well as fairly severe interstitial disease at the lung bases.  Heart size is normal. There is left ventricular hypertrophy. Extensive coronary artery calcification.  The main pulmonary arteries are prominent with peripheral pruning of the vessels suggestive of of pulmonary arterial hypertension.  Visualized portion of the upper abdomen is normal.  Review of the MIP images confirms the above findings.  IMPRESSION: 1. No pulmonary emboli. 2. Fairly severe chronic interstitial and obstructive lung disease with probable pulmonary arterial hypertension, left ventricular hypertrophy, and  extensive coronary artery calcification.   Electronically Signed   By: Rozetta Nunnery M.D.   On: 09/17/2014 00:18     EKG Interpretation   Date/Time:  Friday September 16 2014 14:20:09 EDT Ventricular Rate:  95 PR Interval:  206 QRS Duration: 86 QT Interval:  352 QTC Calculation: 442 R Axis:   -76 Text Interpretation:  Sinus rhythm with occasional Premature ventricular  complexes Pulmonary disease pattern Left anterior fascicular block ST \\T \  T wave abnormality, consider inferior ischemia Abnormal ECG When compared  with ECG of 01/05/2010, ST \\T \ T wave abnormality is now Present concerning  for ischemia Premature ventricular complexes are now Present Confirmed by  [facility]  MD, DAVID ([id number]) on 09/16/2014 3:27:08 PM      MDM   Final  diagnoses:  Chest pain  Dyspnea on exertion  Essential hypertension  New onset a-fib  Parkinson disease  Dyspnea    80 y/o male with 3 wks lightheadedness on standing in AM and new exertional dyspnea x 1 wk. No chest pain, syncope, diaphoresis. No prior cardiac history. Recently started sinemet for Parkinson's by Neurology. Noted to have EKG changes at PCP today. Well appearing on exam with clear lung sounds and euvolemic appearance. EKG with T wave inversion in inferior leads without prior for comparison. Will need evaluation for cardiac etiology of symptoms.     Amparo Bristol, MD 09/17/14 1126

## 2014-09-16 NOTE — H&P (Signed)
PCP: Milagros Evener, MD    Chief Complaint:  Shortness of breath  HPI: Victor Clarke is a 80 y.o. male   has a past medical history of Hypertension; Hypercholesteremia; Gout; COPD (chronic obstructive pulmonary disease); Detached retina; and Prostate cancer.   Presented with  For the past 1 month patient have been light headed when he moves. For the past 1 week he has been more short of breath with minimal activity. Denies any chest pain. He was recently diagnosed with parkins disease and started on Sinemet. Patient presented to ER due to ongoing dyspnea which was sudden in onset. While being evaluated in emergency department noted short run of apparent A. fib with RVR which self resolved. Currently impossible MAT rhythm  Hospitalist was called for admission for dyspnea and arrhythmia  Review of Systems:    Pertinent positives include:  dyspnea on exertion,   Constitutional:  No weight loss, night sweats, Fevers, chills, fatigue, weight loss  HEENT:  No headaches, Difficulty swallowing,Tooth/dental problems,Sore throat,  No sneezing, itching, ear ache, nasal congestion, post nasal drip,  Cardio-vascular:  No chest pain, Orthopnea, PND, anasarca, dizziness, palpitations.no Bilateral lower extremity swelling  GI:  No heartburn, indigestion, abdominal pain, nausea, vomiting, diarrhea, change in bowel habits, loss of appetite, melena, blood in stool, hematemesis Resp:  no shortness of breath at rest. No No excess mucus, no productive cough, No non-productive cough, No coughing up of blood.No change in color of mucus.No wheezing. Skin:  no rash or lesions. No jaundice GU:  no dysuria, change in color of urine, no urgency or frequency. No straining to urinate.  No flank pain.  Musculoskeletal:  No joint pain or no joint swelling. No decreased range of motion. No back pain.  Psych:  No change in mood or affect. No depression or anxiety. No memory loss.  Neuro: no localizing  neurological complaints, no tingling, no weakness, no double vision, no gait abnormality, no slurred speech, no confusion  Otherwise ROS are negative except for above, 10 systems were reviewed  Past Medical History: Past Medical History  Diagnosis Date  . Hypertension   . Hypercholesteremia   . Gout   . COPD (chronic obstructive pulmonary disease)   . Detached retina   . Prostate cancer    Past Surgical History  Procedure Laterality Date  . Colonoscopy w/ polypectomy  2001, 2006  . Hernia repair    . Prostate surgery       Medications: Prior to Admission medications   Medication Sig Start Date End Date Taking? Authorizing Provider  allopurinol (ZYLOPRIM) 100 MG tablet Take 100 mg by mouth daily.    Yes Historical Provider, MD  aspirin EC 81 MG tablet Take 81 mg by mouth daily.   Yes Historical Provider, MD  atorvastatin (LIPITOR) 40 MG tablet Take 40 mg by mouth daily.   Yes Historical Provider, MD  carbidopa-levodopa (SINEMET) 25-100 MG per tablet Take 0.5 tablets by mouth 3 (three) times daily. 08/18/14  Yes Melvenia Beam, MD  lisinopril (PRINIVIL,ZESTRIL) 20 MG tablet Take 20 mg by mouth daily.   Yes Historical Provider, MD  meclizine (ANTIVERT) 25 MG tablet Take 12.5 mg by mouth every 8 (eight) hours as needed for dizziness.    Yes Historical Provider, MD  Omega-3 Fatty Acids (FISH OIL) 1200 MG CAPS Take 2,400 mg by mouth daily.   Yes Historical Provider, MD    Allergies:  No Known Allergies  Social History:  Ambulatory  independently   Lives at  home alone,         reports that he quit smoking about 36 years ago. His smoking use included Cigarettes. He has a 44 pack-year smoking history. He has never used smokeless tobacco. He reports that he drinks alcohol. He reports that he does not use illicit drugs.    Family History: family history includes Heart attack in his mother; Stroke in his father.    Physical Exam: Patient Vitals for the past 24 hrs:  BP Temp  Temp src Pulse Resp SpO2 Height Weight  09/16/14 1845 151/89 mmHg - - 91 18 98 % - -  09/16/14 1830 144/88 mmHg - - 91 16 99 % - -  09/16/14 1815 147/98 mmHg - - 89 20 99 % - -  09/16/14 1800 148/99 mmHg - - 91 14 99 % - -  09/16/14 1745 152/94 mmHg - - 92 18 100 % - -  09/16/14 1730 146/91 mmHg - - 92 21 99 % - -  09/16/14 1700 145/94 mmHg - - 91 18 99 % - -  09/16/14 1630 147/92 mmHg - - 91 19 99 % - -  09/16/14 1620 - - - 91 10 99 % - -  09/16/14 1619 154/96 mmHg - - 81 - 100 % - -  09/16/14 1530 146/93 mmHg - - 96 17 99 % - -  09/16/14 1500 144/102 mmHg - - 92 25 100 % - -  09/16/14 1444 - - - 93 18 100 % - -  09/16/14 1443 148/97 mmHg 98.3 F (36.8 C) Oral 93 16 100 % - -  09/16/14 1441 148/97 mmHg - - - - - - -  09/16/14 1424 141/93 mmHg 97.6 F (36.4 C) Oral 94 20 99 % 5\' 9"  (1.753 m) 65.318 kg (144 lb)    1. General:  in No Acute distress 2. Psychological: Alert and   Oriented 3. Head/ENT:   Moist   Mucous Membranes                          Head Non traumatic, neck supple                          Normal   Dentition 4. SKIN:  decreased Skin turgor,  Skin clean Dry and intact no rash 5. Heart: Regular rate and rhythm no Murmur, Rub or gallop 6. Lungs: Somewhat distant no wheezes or crackles   7. Abdomen: Soft, non-tender, Non distended 8. Lower extremities: no clubbing, cyanosis, or edema 9. Neurologically Grossly intact, moving all 4 extremities equally 10. MSK: Normal range of motion  body mass index is 21.26 kg/(m^2).   Labs on Admission:   Results for orders placed during the hospital encounter of 09/16/14 (from the past 24 hour(s))  CBC     Status: Abnormal   Collection Time    09/16/14  2:31 PM      Result Value Ref Range   WBC 4.9  4.0 - 10.5 K/uL   RBC 3.93 (*) 4.22 - 5.81 MIL/uL   Hemoglobin 13.8  13.0 - 17.0 g/dL   HCT 39.2  39.0 - 52.0 %   MCV 99.7  78.0 - 100.0 fL   MCH 35.1 (*) 26.0 - 34.0 pg   MCHC 35.2  30.0 - 36.0 g/dL   RDW 14.8  11.5 - 15.5 %    Platelets 177  150 - 400 K/uL  BASIC METABOLIC  PANEL     Status: Abnormal   Collection Time    09/16/14  2:31 PM      Result Value Ref Range   Sodium 141  137 - 147 mEq/L   Potassium 4.3  3.7 - 5.3 mEq/L   Chloride 100  96 - 112 mEq/L   CO2 19  19 - 32 mEq/L   Glucose, Bld 127 (*) 70 - 99 mg/dL   BUN 17  6 - 23 mg/dL   Creatinine, Ser 1.22  0.50 - 1.35 mg/dL   Calcium 9.2  8.4 - 10.5 mg/dL   GFR calc non Af Amer 53 (*) >90 mL/min   GFR calc Af Amer 61 (*) >90 mL/min   Anion gap 22 (*) 5 - 15  PRO B NATRIURETIC PEPTIDE     Status: Abnormal   Collection Time    09/16/14  2:31 PM      Result Value Ref Range   Pro B Natriuretic peptide (BNP) 2391.0 (*) 0 - 450 pg/mL  I-STAT TROPOININ, ED     Status: None   Collection Time    09/16/14  2:39 PM      Result Value Ref Range   Troponin i, poc 0.07  0.00 - 0.08 ng/mL   Comment 3           I-STAT TROPOININ, ED     Status: None   Collection Time    09/16/14  6:14 PM      Result Value Ref Range   Troponin i, poc 0.06  0.00 - 0.08 ng/mL   Comment 3               No results found for this basename: HGBA1C    Estimated Creatinine Clearance: 42.4 ml/min (by C-G formula based on Cr of 1.22).  BNP (last 3 results)  Recent Labs  09/16/14 1431  PROBNP 2391.0*    Other results:  I have pearsonaly reviewed this: ECG REPORT  Rate 91  Rhythm: possible MAT ST&T Change: No ischemic changes   Filed Weights   09/16/14 1424  Weight: 65.318 kg (144 lb)     Cultures: No results found for this basename: sdes, specrequest, cult, reptstatus     Radiological Exams on Admission: Dg Chest 2 View  09/16/2014   CLINICAL DATA:  80 year old male with shortness of breath for several days with dizziness and chest pain. Initial encounter.  EXAM: CHEST  2 VIEW  COMPARISON:  1453 hr the same day [facility] physicians exam) and earlier.  FINDINGS: Stable large lung volumes. Stable cardiac size and mediastinal contours. Chronic increased  interstitial markings. No pneumothorax, pulmonary edema, pleural effusion or acute pulmonary opacity. No acute osseous abnormality identified. Calcified atherosclerosis of the aorta.  IMPRESSION: Chronic lung disease.  No acute cardiopulmonary abnormality.   Electronically Signed   By: Lars Pinks M.D.   On: 09/16/2014 16:12    Chart has been reviewed  Assessment/Plan  80 year old gentleman with history of hypertension new onset Parkinson disease presents with sudden dyspnea on exertion and abnormal heart rhythm worrisome for MAT versus paroxysmal A. fib.  Present on Admission:  . Dyspnea on exertion - we'll admit to telemetry,  cycle cardiac enzymes,  obtain echogram, even sudden onset will check CT angio chest  . Parkinson disease continue Sinemet  . Hypertension - continue home medications will add beta blocker given potential round of atrial fibrillation  . New onset a-fib - this was a short episode not captured on EKG.  We'll continue to monitor on telemetry. Obtain echogram cycle cardiac enzymes. At this point per cardiology will hold off on anticoagulation given the atrial fibrillation was not confirmed. Patient's Chad2Vasc score is 3 to monitor on telemetry. Cardiology consult in a.m.   Prophylaxis:   Lovenox, Protonix  CODE STATUS: DNR/DNI as per patient  Other plan as per orders.  I have spent a total of 65 min on this admission except and was taken to discuss Patient care with cardiology  [place] 09/16/2014, 7:53 PM  [facility]  Pager [phone]   after 2 AM please page floor coverage PA If 7AM-7PM, please contact the day team taking care of the patient  [email or web address]  Password [email or web address]

## 2014-09-16 NOTE — ED Provider Notes (Signed)
Atrial male comes in with several-day history of dyspnea. Dyspnea is exertional and not related to body position. He denies chest pain. On exam, lungs are clear and heart is regular rate and rhythm. He has no edema. BNP is significantly elevated. He has no history of prior heart failure. He will be admitted for evaluation of new-onset CHF.  I saw and evaluated the patient, reviewed the resident's note and I agree with the findings and plan.   EKG Interpretation   Date/Time:  Friday September 16 2014 14:20:09 EDT Ventricular Rate:  95 PR Interval:  206 QRS Duration: 86 QT Interval:  352 QTC Calculation: 442 R Axis:   -76 Text Interpretation:  Sinus rhythm with occasional Premature ventricular  complexes Pulmonary disease pattern Left anterior fascicular block ST \\T \  T wave abnormality, consider inferior ischemia Abnormal ECG When compared  with ECG of 01/05/2010, ST \\T \ T wave abnormality is now Present concerning  for ischemia Premature ventricular complexes are now Present Confirmed by  Roxanne Mins  MD, Jelisha Weed ([id number]) on 09/16/2014 3:27:08 PM        Delora Fuel, MD 27/25/36 6440

## 2014-09-16 NOTE — ED Notes (Signed)
Patient transported to X-ray 

## 2014-09-17 DIAGNOSIS — I517 Cardiomegaly: Secondary | ICD-10-CM

## 2014-09-17 DIAGNOSIS — R0609 Other forms of dyspnea: Secondary | ICD-10-CM

## 2014-09-17 DIAGNOSIS — I10 Essential (primary) hypertension: Secondary | ICD-10-CM

## 2014-09-17 LAB — CBC
HCT: 31.2 % — ABNORMAL LOW (ref 39.0–52.0)
Hemoglobin: 11 g/dL — ABNORMAL LOW (ref 13.0–17.0)
MCH: 35.1 pg — ABNORMAL HIGH (ref 26.0–34.0)
MCHC: 35.3 g/dL (ref 30.0–36.0)
MCV: 99.7 fL (ref 78.0–100.0)
Platelets: 162 10*3/uL (ref 150–400)
RBC: 3.13 MIL/uL — AB (ref 4.22–5.81)
RDW: 14.8 % (ref 11.5–15.5)
WBC: 4.1 10*3/uL (ref 4.0–10.5)

## 2014-09-17 LAB — URINALYSIS, ROUTINE W REFLEX MICROSCOPIC
BILIRUBIN URINE: NEGATIVE
Glucose, UA: NEGATIVE mg/dL
Hgb urine dipstick: NEGATIVE
KETONES UR: 15 mg/dL — AB
Leukocytes, UA: NEGATIVE
NITRITE: NEGATIVE
PH: 5.5 (ref 5.0–8.0)
Protein, ur: NEGATIVE mg/dL
Specific Gravity, Urine: >1.046 — ABNORMAL HIGH (ref 1.005–1.030)
UROBILINOGEN UA: 0.2 mg/dL (ref 0.0–1.0)

## 2014-09-17 LAB — HEMOGLOBIN A1C
HEMOGLOBIN A1C: 5.3 % (ref <5.7)
Mean Plasma Glucose: 105 mg/dL (ref <117)

## 2014-09-17 LAB — COMPREHENSIVE METABOLIC PANEL
ALT: 8 U/L (ref 0–53)
ANION GAP: 15 (ref 5–15)
AST: 38 U/L — ABNORMAL HIGH (ref 0–37)
Albumin: 3 g/dL — ABNORMAL LOW (ref 3.5–5.2)
Alkaline Phosphatase: 38 U/L — ABNORMAL LOW (ref 39–117)
BUN: 20 mg/dL (ref 6–23)
CALCIUM: 8.1 mg/dL — AB (ref 8.4–10.5)
CO2: 22 mEq/L (ref 19–32)
Chloride: 99 mEq/L (ref 96–112)
Creatinine, Ser: 1.14 mg/dL (ref 0.50–1.35)
GFR calc non Af Amer: 58 mL/min — ABNORMAL LOW (ref >90)
GFR, EST AFRICAN AMERICAN: 67 mL/min — AB (ref >90)
GLUCOSE: 92 mg/dL (ref 70–99)
Potassium: 4.1 mEq/L (ref 3.7–5.3)
Sodium: 136 mEq/L — ABNORMAL LOW (ref 137–147)
TOTAL PROTEIN: 5.8 g/dL — AB (ref 6.0–8.3)
Total Bilirubin: 1.1 mg/dL (ref 0.3–1.2)

## 2014-09-17 LAB — MAGNESIUM: MAGNESIUM: 1.1 mg/dL — AB (ref 1.5–2.5)

## 2014-09-17 LAB — TROPONIN I: Troponin I: <0.3 ng/mL (ref <0.30)

## 2014-09-17 LAB — TSH: TSH: 2.31 u[IU]/mL (ref 0.350–4.500)

## 2014-09-17 LAB — PHOSPHORUS: Phosphorus: 3.6 mg/dL (ref 2.3–4.6)

## 2014-09-17 MED ORDER — SODIUM CHLORIDE 0.9 % IV SOLN
INTRAVENOUS | Status: AC
  Administered 2014-09-17 (×2): via INTRAVENOUS

## 2014-09-17 NOTE — Progress Notes (Signed)
Echocardiogram 2D Echocardiogram has been performed.  Victor Clarke 09/17/2014, 11:29 AM

## 2014-09-17 NOTE — Progress Notes (Signed)
[facility] PROGRESS NOTE  Assessment/Plan: Dyspnea on exertion: - Unclear etiology, the patient is a poor historian. He doesn't have JVD or pedal jugular reflux, he does have crackles on his lungs bilaterally that are not clear with cough - Continue to monitor on telemetry, cardiac enzymes negative till date. - CT angiogram of the chest was negative for PE some arterial pulmonary hypertension. - 2-D echo is pending.  Essential Hypertension - BP currently stable.  Parkinson disease - Continue Sinemet.    Code Status: DNR/DNI Family Communication: none  Disposition Plan: obervation   Consultants:  none  Procedures:  Ct angio chest  Antibiotics:  None  HPI/Subjective: Poor historian, wants to go home.  Objective: Filed Vitals:   09/16/14 1946 09/16/14 2000 09/16/14 2102 09/17/14 0641  BP:  155/94 133/79 124/75  Pulse:  94 92 65  Temp:   98.2 F (36.8 C) 98.1 F (36.7 C)  TempSrc:   Oral Oral  Resp:  19 20 20   Height:   5\' 9"  (1.753 m)   Weight:   63.9 kg (140 lb 14 oz) 64.9 kg (143 lb 1.3 oz)  SpO2: 98% 94% 100% 99%    Intake/Output Summary (Last 24 hours) at 09/17/14 0728 Last data filed at 09/17/14 0600  Gross per 24 hour  Intake    545 ml  Output    100 ml  Net    445 ml   Filed Weights   09/16/14 1424 09/16/14 2102 09/17/14 0641  Weight: 65.318 kg (144 lb) 63.9 kg (140 lb 14 oz) 64.9 kg (143 lb 1.3 oz)    Exam:  General: Alert, awake, oriented x3, in no acute distress.  HEENT: No bruits, no goiter. Negative JVD Heart: Regular rate and rhythm. Lungs: Good air movement, crackles bilaterally Abdomen: Soft, nontender, nondistended, positive bowel sounds.  Neuro: Grossly intact, nonfocal.   Data Reviewed: Basic Metabolic Panel:  Recent Labs Lab 09/16/14 1431 09/17/14 0259  NA 141 136*  K 4.3 4.1  CL 100 99  CO2 19 22  GLUCOSE 127* 92  BUN 17 20  CREATININE 1.22 1.14  CALCIUM 9.2 8.1*  MG  --  1.1*  PHOS  --  3.6    Liver Function Tests:  Recent Labs Lab 09/17/14 0259  AST 38*  ALT 8  ALKPHOS 38*  BILITOT 1.1  PROT 5.8*  ALBUMIN 3.0*   No results found for this basename: LIPASE, AMYLASE,  in the last 168 hours No results found for this basename: AMMONIA,  in the last 168 hours CBC:  Recent Labs Lab 09/16/14 1431 09/17/14 0259  WBC 4.9 4.1  HGB 13.8 11.0*  HCT 39.2 31.2*  MCV 99.7 99.7  PLT 177 162   Cardiac Enzymes:  Recent Labs Lab 09/16/14 2224 09/17/14 0259  TROPONINI <0.30 <0.30   BNP (last 3 results)  Recent Labs  09/16/14 1431  PROBNP 2391.0*   CBG: No results found for this basename: GLUCAP,  in the last 168 hours  No results found for this or any previous visit (from the past 240 hour(s)).   Studies: Dg Chest 2 View  09/16/2014   CLINICAL DATA:  80 year old male with shortness of breath for several days with dizziness and chest pain. Initial encounter.  EXAM: CHEST  2 VIEW  COMPARISON:  1453 hr the same day [facility] physicians exam) and earlier.  FINDINGS: Stable large lung volumes. Stable cardiac size and mediastinal contours. Chronic increased interstitial markings. No pneumothorax, pulmonary edema, pleural effusion or  acute pulmonary opacity. No acute osseous abnormality identified. Calcified atherosclerosis of the aorta.  IMPRESSION: Chronic lung disease.  No acute cardiopulmonary abnormality.   Electronically Signed   By: Lars Pinks M.D.   On: 09/16/2014 16:12   Ct Angio Chest Pe W/cm &/or Wo Cm  09/17/2014   CLINICAL DATA:  Shortness of breath.  Dyspnea.  EXAM: CT ANGIOGRAPHY CHEST WITH CONTRAST  TECHNIQUE: Multidetector CT imaging of the chest was performed using the standard protocol during bolus administration of intravenous contrast. Multiplanar CT image reconstructions and MIPs were obtained to evaluate the vascular anatomy.  CONTRAST:  164mL OMNIPAQUE IOHEXOL 350 MG/ML SOLN  COMPARISON:  Chest x-ray dated 09/16/2014  FINDINGS: There are no pulmonary  emboli, infiltrates, or pleural effusions. Patient has extensive emphysematous disease as well as fairly severe interstitial disease at the lung bases.  Heart size is normal. There is left ventricular hypertrophy. Extensive coronary artery calcification.  The main pulmonary arteries are prominent with peripheral pruning of the vessels suggestive of of pulmonary arterial hypertension.  Visualized portion of the upper abdomen is normal.  Review of the MIP images confirms the above findings.  IMPRESSION: 1. No pulmonary emboli. 2. Fairly severe chronic interstitial and obstructive lung disease with probable pulmonary arterial hypertension, left ventricular hypertrophy, and extensive coronary artery calcification.   Electronically Signed   By: Rozetta Nunnery M.D.   On: 09/17/2014 00:18    Scheduled Meds: . allopurinol  100 mg Oral Daily  . aspirin EC  81 mg Oral Daily  . atorvastatin  40 mg Oral Daily  . carbidopa-levodopa  0.5 tablet Oral TID  . docusate sodium  100 mg Oral BID  . enoxaparin (LOVENOX) injection  40 mg Subcutaneous Q24H  . lisinopril  20 mg Oral Daily  . metoprolol tartrate  12.5 mg Oral BID   Continuous Infusions:    Charlynne Cousins  [facility] Pager [phone]. If 8PM-8AM, please contact night-coverage at [email or web address], password [facility] 09/17/2014, 7:28 AM  LOS: 1 day

## 2014-09-18 DIAGNOSIS — G20 Parkinson's disease: Secondary | ICD-10-CM

## 2014-09-18 MED ORDER — METOPROLOL TARTRATE 12.5 MG HALF TABLET: 12.5000 mg | ORAL_TABLET | Freq: Two times a day (BID) | ORAL | Status: DC

## 2014-09-18 NOTE — Discharge Summary (Signed)
Physician Discharge Summary  Victor Clarke ESP:[id number] DOB: 08-30-1929 DOA: 09/16/2014  PCP: Milagros Evener, MD  Admit date: 09/16/2014 Discharge date: 09/18/2014  Time spent: 74minutes  Recommendations for Outpatient Follow-up:  1. Follow up with cardiology as an outpatient 2-4 weeks.  Discharge Diagnoses:  Active Problems:   Parkinson disease   Dyspnea on exertion   Hypertension   Discharge Condition: Stable  Diet recommendation: heart healthy  Filed Weights   09/16/14 2102 09/17/14 0641 09/18/14 0500  Weight: 63.9 kg (140 lb 14 oz) 64.9 kg (143 lb 1.3 oz) 64.9 kg (143 lb 1.3 oz)    History of present illness:  80 y.o. male has a past medical history of Hypertension; Hypercholesteremia; Gout; COPD (chronic obstructive pulmonary disease); Detached retina; and Prostate cancer. Presented with For the past 1 month patient have been light headed when he moves. For the past 1 week he has been more short of breath with minimal activity. Denies any chest pain. He was recently diagnosed with parkins disease and started on Sinemet. Patient presented to ER due to ongoing dyspnea which was sudden in onset. While being evaluated in emergency department noted short run of apparent A. fib with RVR which self resolved. Currently impossible MAT rhythm  Hospital Course:  Dyspnea on exertion: - Unclear etiology, the patient is a poor historian. He doesn't have JVD or pedal jugular reflux. - No events on telemetry, 2 12 leads EKG no change. - Continue to monitor on telemetry, cardiac enzymes negative till date. - CT angiogram of the chest was negative for PE some arterial pulmonary hypertension. - 2-D echo as below  Essential Hypertension - BP currently stable.  Parkinson disease - Continue Sinemet.  Procedures:  ECHo 11.1.2015: There was moderate concentric hypertrophy. Systolic function was normal. The estimated ejection fraction was in the range of 55% to 60%. Wall motion  was normal; there were no regional wall motion abnormalities. Left ventricular diastolic function parameters were normal.  Aortic valve: Valve area (Vmax): 2.42 cm^2. - Mitral valve: Calcified annulus. - Left atrium: The atrium was mildly dilated.  Ct angi chest no PE.  Consultations:  none  Discharge Exam: Filed Vitals:   09/18/14 0942  BP: 108/77  Pulse: 74  Temp:   Resp:     General: A&O x3 Cardiovascular: RRR Respiratory: good air movement CTA B/L  Discharge Instructions You were cared for by a hospitalist during your hospital stay. If you have any questions about your discharge medications or the care you received while you were in the hospital after you are discharged, you can call the unit and asked to speak with the hospitalist on call if the hospitalist that took care of you is not available. Once you are discharged, your primary care physician will handle any further medical issues. Please note that NO REFILLS for any discharge medications will be authorized once you are discharged, as it is imperative that you return to your primary care physician (or establish a relationship with a primary care physician if you do not have one) for your aftercare needs so that they can reassess your need for medications and monitor your lab values.  Discharge Instructions    Diet - low sodium heart healthy    Complete by:  As directed      Increase activity slowly    Complete by:  As directed           Current Discharge Medication List    START taking these medications   Details  metoprolol tartrate (LOPRESSOR) 12.5 mg TABS tablet Take 0.5 tablets (12.5 mg total) by mouth 2 (two) times daily. Qty: 30 tablet, Refills: 0      CONTINUE these medications which have NOT CHANGED   Details  allopurinol (ZYLOPRIM) 100 MG tablet Take 100 mg by mouth daily.     aspirin EC 81 MG tablet Take 81 mg by mouth daily.    atorvastatin (LIPITOR) 40 MG tablet Take 40 mg by mouth daily.     carbidopa-levodopa (SINEMET) 25-100 MG per tablet Take 0.5 tablets by mouth 3 (three) times daily. Qty: 45 tablet, Refills: 3    lisinopril (PRINIVIL,ZESTRIL) 20 MG tablet Take 20 mg by mouth daily.    meclizine (ANTIVERT) 25 MG tablet Take 12.5 mg by mouth every 8 (eight) hours as needed for dizziness.     Omega-3 Fatty Acids (FISH OIL) 1200 MG CAPS Take 2,400 mg by mouth daily.       No Known Allergies Follow-up Information    Follow up with [facility] In 1 week.   Why:  hospital follow up   Contact information:   [place] [address] [phone]       The results of significant diagnostics from this hospitalization (including imaging, microbiology, ancillary and laboratory) are listed below for reference.    Significant Diagnostic Studies: Dg Chest 2 View  09/16/2014   CLINICAL DATA:  80 year old male with shortness of breath for several days with dizziness and chest pain. Initial encounter.  EXAM: CHEST  2 VIEW  COMPARISON:  1453 hr the same day [facility] physicians exam) and earlier.  FINDINGS: Stable large lung volumes. Stable cardiac size and mediastinal contours. Chronic increased interstitial markings. No pneumothorax, pulmonary edema, pleural effusion or acute pulmonary opacity. No acute osseous abnormality identified. Calcified atherosclerosis of the aorta.  IMPRESSION: Chronic lung disease.  No acute cardiopulmonary abnormality.   Electronically Signed   By: Lars Pinks M.D.   On: 09/16/2014 16:12   Ct Angio Chest Pe W/cm &/or Wo Cm  09/17/2014   CLINICAL DATA:  Shortness of breath.  Dyspnea.  EXAM: CT ANGIOGRAPHY CHEST WITH CONTRAST  TECHNIQUE: Multidetector CT imaging of the chest was performed using the standard protocol during bolus administration of intravenous contrast. Multiplanar CT image reconstructions and MIPs were obtained to evaluate the vascular anatomy.  CONTRAST:  11mL  OMNIPAQUE IOHEXOL 350 MG/ML SOLN  COMPARISON:  Chest x-ray dated 09/16/2014  FINDINGS: There are no pulmonary emboli, infiltrates, or pleural effusions. Patient has extensive emphysematous disease as well as fairly severe interstitial disease at the lung bases.  Heart size is normal. There is left ventricular hypertrophy. Extensive coronary artery calcification.  The main pulmonary arteries are prominent with peripheral pruning of the vessels suggestive of of pulmonary arterial hypertension.  Visualized portion of the upper abdomen is normal.  Review of the MIP images confirms the above findings.  IMPRESSION: 1. No pulmonary emboli. 2. Fairly severe chronic interstitial and obstructive lung disease with probable pulmonary arterial hypertension, left ventricular hypertrophy, and extensive coronary artery calcification.   Electronically Signed   By: Rozetta Nunnery M.D.   On: 09/17/2014 00:18    Microbiology: No results found for this or any previous visit (from the past 240 hour(s)).   Labs: Basic Metabolic Panel:  Recent Labs Lab 09/16/14 1431 09/17/14 0259  NA 141 136*  K 4.3 4.1  CL 100 99  CO2 19 22  GLUCOSE 127* 92  BUN 17 20  CREATININE 1.22 1.14  CALCIUM 9.2 8.1*  MG  --  1.1*  PHOS  --  3.6   Liver Function Tests:  Recent Labs Lab 09/17/14 0259  AST 38*  ALT 8  ALKPHOS 38*  BILITOT 1.1  PROT 5.8*  ALBUMIN 3.0*   No results for input(s): LIPASE, AMYLASE in the last 168 hours. No results for input(s): AMMONIA in the last 168 hours. CBC:  Recent Labs Lab 09/16/14 1431 09/17/14 0259  WBC 4.9 4.1  HGB 13.8 11.0*  HCT 39.2 31.2*  MCV 99.7 99.7  PLT 177 162   Cardiac Enzymes:  Recent Labs Lab 09/16/14 2224 09/17/14 0259 09/17/14 0950  TROPONINI <0.30 <0.30 <0.30   BNP: BNP (last 3 results)  Recent Labs  09/16/14 1431  PROBNP 2391.0*   CBG: No results for input(s): GLUCAP in the last 168 hours.     Signed:  Charlynne Cousins  [facility] 09/18/2014, 10:13 AM

## 2014-09-18 NOTE — Progress Notes (Signed)
Patient alert oriented, no c/o pain, shotrness of breath  or dizziness, v/s stable. Tele, iv d/c, d/c instruction and prescibtion given to patient. Patient verbalized understanding, secretary will call patient tomorrow for cardiologist appointment. Patient d/c per order.

## 2014-09-28 ENCOUNTER — Ambulatory Visit: Payer: Medicare Other | Admitting: Cardiology

## 2014-09-29 ENCOUNTER — Encounter: Payer: Self-pay | Admitting: Neurology

## 2014-09-29 ENCOUNTER — Ambulatory Visit (INDEPENDENT_AMBULATORY_CARE_PROVIDER_SITE_OTHER): Payer: Medicare Other | Admitting: Neurology

## 2014-09-29 ENCOUNTER — Ambulatory Visit (INDEPENDENT_AMBULATORY_CARE_PROVIDER_SITE_OTHER): Payer: Medicare Other | Admitting: Cardiology

## 2014-09-29 ENCOUNTER — Encounter: Payer: Self-pay | Admitting: Cardiology

## 2014-09-29 VITALS — BP 139/96 | HR 175 | Ht 69.0 in | Wt 145.0 lb

## 2014-09-29 VITALS — BP 118/60 | HR 94 | Ht 69.0 in | Wt 144.2 lb

## 2014-09-29 DIAGNOSIS — E7800 Pure hypercholesterolemia, unspecified: Secondary | ICD-10-CM

## 2014-09-29 DIAGNOSIS — G20A1 Parkinson's disease without dyskinesia, without mention of fluctuations: Secondary | ICD-10-CM

## 2014-09-29 DIAGNOSIS — I4719 Other supraventricular tachycardia: Secondary | ICD-10-CM

## 2014-09-29 DIAGNOSIS — I471 Supraventricular tachycardia: Secondary | ICD-10-CM

## 2014-09-29 DIAGNOSIS — G20 Parkinson's disease: Secondary | ICD-10-CM

## 2014-09-29 DIAGNOSIS — R42 Dizziness and giddiness: Secondary | ICD-10-CM

## 2014-09-29 DIAGNOSIS — I10 Essential (primary) hypertension: Secondary | ICD-10-CM

## 2014-09-29 MED ORDER — DILTIAZEM HCL ER COATED BEADS 120 MG PO CP24: 120.0000 mg | ORAL_CAPSULE | Freq: Every day | ORAL | Status: DC

## 2014-09-29 NOTE — Progress Notes (Signed)
[facility]    Provider:  Dr Jaynee Eagles Referring Provider: Aretta Nip, MD Primary Care Physician:  Milagros Evener, MD  CC: Dizziness  HPI:  Victor Clarke is a 80 y.o. male here as a referral from Dr. Radene Ou for balance and gait disorder  Patient continues to have episodes of dizziness.  Worsening. He fell yesterday at home because he felt dizzy.  He was originally seen here in the office and displayed parkinsonian symptoms on clinical exam however sinemet did not help and he has stopped taking the Sinemet.And his chief complaint was dizziness, he is not concerned with gait or balance. The dizziness is worsening. He was standing at the counter at the kitchen yesterday and a dizzy spell came over him and he flopped down on the floor. Episode lasted 3 minutes.  He was clutching the counter just trying to stand. No loss of consciousness. The room was not spinning. Can't describe what he feels is dizzy. "I was just struggling to get up". He was conscious. Fell on his knees. He was SOB during the episode. He sat in a chair and then all of a sudden he felt better.   This morning he was dizzy but not enough to fall. He was at the counter at the kitchen standing making coffee. He lives alone in his home. Dizziness lasted less than a minute then resolved. No chest pain. He feels his heart racing occasionally. He is having SOB with the dizziness.  He says he is missing medication. He forgets. He misses his schedule of some of the drugs. Sometimes "I just don't think to take it". He has no family to help him. He doesn't want to live in an assisted living facility. Family is not near by, his brother is in [place] and niece lives in [place]. He was going to buy a pill organizer today as suggested by his niece. He has not taken his medication yet today. "I just didn't think about it" referring to taking his medication.  Reviewed notes, labs and imaging from outside  physicians, which showed; Patient was admitted on 10/30 for SOB and dizziness. While being evaluated in emergency department noted short run of apparent A. fib with RVR which self resolved. ECHo 11.1.2015: There was moderate concentric hypertrophy. Systolic function was normal. The estimated ejection fraction was in the range of 55% to 60%. Wall motion was normal; there were no regional wall motion abnormalities. Left ventricular diastolic function parameters were normal. Aortic valve: Valve area (Vmax): 2.42 cm^2. - Mitral valve: Calcified annulus. - Left atrium: The atrium was mildly dilated. Ct angi chest no PE. No events on telemetry, 2 12 leads EKG no change.  Consultations:  Review of Systems: Patient complains of symptoms per HPI as well as the following symptoms: Denies CP. Pertinent negatives per HPI. All others negative.   History   Social History  . Marital Status: Widowed    Spouse Name: N/A    Number of Children: 0  . Years of Education: College   Occupational History  . Retired Other   Social History Main Topics  . Smoking status: Former Smoker -- 2.00 packs/day for 22 years    Types: Cigarettes    Quit date: 11/18/1977  . Smokeless tobacco: Never Used  . Alcohol Use: 0.5 oz/week    1 drink(s) per week     Comment: 2 drinks daily  . Drug Use: No  . Sexual Activity: No   Other Topics  Concern  . Not on file   Social History Narrative   Patient lives at home alone.   Caffeine Use: 1 cup daily    Family History  Problem Relation Age of Onset  . Stroke Father   . Heart attack Mother     Past Medical History  Diagnosis Date  . Hypertension   . Hypercholesteremia   . Gout   . COPD (chronic obstructive pulmonary disease)   . Detached retina   . Prostate cancer     Past Surgical History  Procedure Laterality Date  . Colonoscopy w/ polypectomy  2001, 2006  . Hernia repair    . Prostate surgery      Current Outpatient Prescriptions    Medication Sig Dispense Refill  . allopurinol (ZYLOPRIM) 100 MG tablet Take 100 mg by mouth daily.     Marland Kitchen aspirin EC 81 MG tablet Take 81 mg by mouth daily.    Marland Kitchen atorvastatin (LIPITOR) 40 MG tablet Take 40 mg by mouth daily.    . carbidopa-levodopa (SINEMET) 25-100 MG per tablet Take 0.5 tablets by mouth 3 (three) times daily. 45 tablet 3  . lisinopril (PRINIVIL,ZESTRIL) 20 MG tablet Take 20 mg by mouth daily.    . meclizine (ANTIVERT) 25 MG tablet Take 12.5 mg by mouth every 8 (eight) hours as needed for dizziness.     . metoprolol tartrate (LOPRESSOR) 12.5 mg TABS tablet Take 0.5 tablets (12.5 mg total) by mouth 2 (two) times daily. 30 tablet 0  . Omega-3 Fatty Acids (FISH OIL) 1200 MG CAPS Take 2,400 mg by mouth daily.     No current facility-administered medications for this visit.    Allergies as of 09/29/2014  . (No Known Allergies)    Vitals: BP 139/96 mmHg  Pulse 175  Ht 5\' 9"  (1.753 m)  Wt 145 lb (65.772 kg)  BMI 21.40 kg/m2 Last Weight:  Wt Readings from Last 1 Encounters:  09/29/14 145 lb (65.772 kg)   Last Height:   Ht Readings from Last 1 Encounters:  09/29/14 5\' 9"  (1.753 m)   Physical exam: Exam: Gen: NAD, conversant, well nourised, well groomed  CV: Originally tachycardic and irregular. Eyes: Conjunctivae clear without exudates or hemorrhage  Neuro: Detailed Neurologic Exam  Speech:  Speech is normal; fluent and spontaneous with normal comprehension.  Cognition:  The patient is oriented to person, place, and time;  Cranial Nerves:  The pupils are equal, round, and reactive to light. Visual fields are full to finger confrontation. Extraocular movements are intact. Trigeminal sensation is intact and the muscles of mastication are normal. The face is symmetric. The palate elevates in the midline. Voice is normal. Shoulder shrug is normal. The tongue has normal motion without fasciculations.   Coordination:  Normal finger to  nose and heel to shin. Good RAM bilaterally, finger tap. Difficulty with tandem.  Gait:  Good stride, turn. Mildly reduced arm swing.  Motor Observation:  Left > right high-amplitude low-frequency resting and postural tremor Tone:  Left arm and hand cogwheeling, increased with facilitation  Posture:  Posture is normal.    Strength:  Strength is V/V in the upper and lower limbs.    Sensation: intact to LT   Reflex Exam:  DTR's:  Brisk throughout Toes:  The toes are downgoing bilaterally.  Clonus:  Clonus is absent.      Assessment/Plan: 80 year old male who is referred from his physician for balance and gait evaluation. He has mostly complained about episodes of dizziness  and today his heart rate is 166 and irregular initially and then becomes regular. He lives alone, is a poor historian and has cognitive deficits and later in the appointment he says he has not taken his medication. Appears to have poor medication compliance. Cardiology has kindly agreed to see him today. Not sure if there is anything they can do such as loop recorder to see if dizziness corresponds to arrhythmia or radiofrequency ablation given that patient has now told me that he is not compliant with taking medications.    He does appear parkinsonian on exam. He demonstrates a left>right high amlitude, low frequency resting and postural tremor tremor, masked facies, left arm cogwheeling which worsens with facilitation and brisk reflexes throughout. However this appears mild, the Sinemet didn't seem to help per patient and this is not his major concern. He has already stopped the Sinemet.   Non compliance: I recommended looking in assisted living. He declines. PCP needs to discuss with him as well. A pill organizer would help.   Cognitive dysfunction: will continue to follow. Will doa MoCA at next appointment. May consider aricept at next appointment.    08/29/14: MMSE 24/30  with loss of following points, sent by primary care dr rankins.   Orientation: floor of building -1 Attention and calculation -1 Can't repeat 3 words to remember -3 Copy design -[place], MD  [facility] [address] [place], [place] [address]  Phone [phone] Fax [phone]

## 2014-09-29 NOTE — Patient Instructions (Signed)
Overall you are doing fairly well but I do want to suggest a few things today:   Remember to drink plenty of fluid, eat healthy meals and do not skip any meals. Try to eat protein with a every meal and eat a healthy snack such as fruit or nuts in between meals. Try to keep a regular sleep-wake schedule and try to exercise daily, particularly in the form of walking, 20-30 minutes a day, if you can  As far as diagnostic testing: Appointment today with Cardiology. [address] at [facility]  I would like to see you back in 3 months, sooner if we need to. Please call us with any interim questions, concerns, problems, updates or refill requests.   Please also call us for any test results so we can go over those with you on the phone.  My clinical assistant and will answer any of your questions and relay your messages to me and also relay most of my messages to you.   Our phone number is [phone]. We also have an after hours call service for urgent matters and there is a physician on-call for urgent questions. For any emergencies you know to call 911 or go to the nearest emergency room

## 2014-09-29 NOTE — Progress Notes (Signed)
Patient Name: Victor Clarke Date of Encounter: 09/29/2014  Primary Care Provider:  Milagros Evener, MD Primary Cardiologist:  New - seen by M. Marlou Porch, MD  Patient Profile  80 y/o male who presents as a new pt today for evaluation of dyspnea, lightheadedness, and EAT.  Problem List   Past Medical History  Diagnosis Date  . Hypertension   . Hypercholesteremia   . Gout   . COPD (chronic obstructive pulmonary disease)   . Detached retina     a. s/p surgical correction on the right.  . Prostate cancer     a. s/p TURP.  Marland Kitchen Ectopic atrial tachycardia     a. 08/2014 Echo: EF 55-60%, mildly dil LA.  . Parkinson's disease   . Left inguinal hernia     a. s/p repair ~ 10 yrs ago.   Past Surgical History  Procedure Laterality Date  . Colonoscopy w/ polypectomy  2001, 2006  . Hernia repair    . Prostate surgery      Allergies  No Known Allergies  HPI  80 y/o male with the above problem list.  He was recently admitted to [facility] in late October with c/o lightheadedness and dyspnea.  Echo showed nl LV fxn.  CTA showed no evidence of PE.  Review of ECG's shows that he was in an atrial tachycardia in the 90's on admission but by 10/31, he was in sinus rhythm @ 62.  He was subsequently discharged and has continued to experience DOE and intermittent lightheadedness. He walks a mile/day and has had to take this at a slower pace over the past 3 wks.  On at least two occasions, he has become markedly lightheaded while walking and had to stop and sit for a few minutes before it would pass.  Yesterday, he was at home in his kitchen and became markedly lightheaded and fell w/o losing consciousness.  He denies chest pain, palpitations, pnd, orthopnea, n, v, dizziness, syncope, edema, weight gain, or early satiety.  He was seen in neuro clinic today and they initially documented a HR of 175. We were called and he was added on as a new pt today.  He is currently asymptomatic.  Home  Medications  Prior to Admission medications   Medication Sig Start Date End Date Taking? Authorizing Provider  allopurinol (ZYLOPRIM) 100 MG tablet Take 100 mg by mouth daily.    Yes Historical Provider, MD  aspirin EC 81 MG tablet Take 81 mg by mouth daily.   Yes Historical Provider, MD  atorvastatin (LIPITOR) 40 MG tablet Take 40 mg by mouth daily.   Yes Historical Provider, MD  carbidopa-levodopa (SINEMET) 25-100 MG per tablet Take 0.5 tablets by mouth 3 (three) times daily. 08/18/14  Yes Melvenia Beam, MD  lisinopril (PRINIVIL,ZESTRIL) 20 MG tablet Take 20 mg by mouth daily.   Yes Historical Provider, MD  meclizine (ANTIVERT) 25 MG tablet Take 12.5 mg by mouth every 8 (eight) hours as needed for dizziness.    Yes Historical Provider, MD  Omega-3 Fatty Acids (FISH OIL) 1200 MG CAPS Take 2,400 mg by mouth daily.   Yes Historical Provider, MD  diltiazem (CARDIZEM CD) 120 MG 24 hr capsule Take 1 capsule (120 mg total) by mouth daily. 09/29/14   Rogelia Mire, NP  diltiazem (CARDIZEM CD) 120 MG 24 hr capsule Take 1 capsule (120 mg total) by mouth daily. 09/29/14   Rogelia Mire, NP    Family History  Family History  Problem Relation  Age of Onset  . Stroke Father     died @ 31.  Marland Kitchen Heart attack Mother     died @ 36  . Stroke Brother     died in his 2's  . Heart attack Brother     died in his 58's    Social History  History   Social History  . Marital Status: Widowed    Spouse Name: N/A    Number of Children: 0  . Years of Education: College   Occupational History  . Retired Other   Social History Main Topics  . Smoking status: Former Smoker -- 2.00 packs/day for 22 years    Types: Cigarettes    Quit date: 11/18/1977  . Smokeless tobacco: Never Used  . Alcohol Use: 0.6 oz/week    1 Not specified per week     Comment: 4 oz of gin daily.  . Drug Use: No  . Sexual Activity: No   Other Topics Concern  . Not on file   Social History Narrative   Patient  lives at home alone.   Caffeine Use: 1 cup daily     Review of Systems General:  No chills, fever, night sweats or weight changes.  Cardiovascular:  +++ presyncope.  No chest pain, +++ dyspnea on exertion, no edema, orthopnea, palpitations, paroxysmal nocturnal dyspnea. Dermatological: No rash, lesions/masses Respiratory: No cough, +++ dyspnea Urologic: No hematuria, dysuria.  +++ Frequent urination. Abdominal:   No nausea, vomiting, diarrhea, bright red blood per rectum, melena, or hematemesis Neurologic:  No visual changes, wkns, changes in mental status. All other systems reviewed and are otherwise negative except as noted above.  Physical Exam  Blood pressure 118/60, pulse 94, height 5\' 9"  (1.753 m), weight 144 lb 3.2 oz (65.409 kg), SpO2 98 %.  General: Pleasant, NAD Psych: Normal affect. Neuro: Alert and oriented X 3. Moves all extremities spontaneously. HEENT: Normal  Neck: Supple without bruits or JVD. Lungs:  Resp regular and unlabored, CTA. Heart: RRR no s3, s4, or murmurs. Abdomen: Soft, non-tender, non-distended, BS + x 4.  Extremities: No clubbing, cyanosis or edema. DP/PT/Radials 2+ and equal bilaterally.  Accessory Clinical Findings  ECG - Ectopic atrial tachycardia, 2:1 conduction, 94, pvc, diffuse st abnormality.  Murray Hodgkins, NP 09/29/2014, 5:23 PM  Personally seen and examined. Agree with above.  80 year old with ectopic atrial tachycardia with 2:1 conduction. Recent episode resulting in tachycardia with one-to-one conduction. Felt lightheaded at the time. Denies chest pain, fevers, syncope. See above for details.  No obvious murmurs on exam, currently regular rate and rhythm, no edema, no JVD, normal respirations.  EKG personally reviewed and discussed with Dr. Rayann Heman. Ectopic atrial tachycardia with 2-1 conduction.  Assessment and plan:  1. Ectopic atrial tachycardia -We will go ahead and place on AV nodal blockade, diltiazem 120 mg CD once a  day. Hopefully this will help lessen chance of 1:1 conduction.  -discussion with Dr. Rayann Heman. He will graciously see in clinic in follow-up. Potential for ablative therapy in the future if we are unsuccessful at controlling symptoms. -Currently asymptomatic.  2. Parkinson's -Stable seeing neurology.  3. Hypertension -Currently stable.  4. Hyperlipidemia -No change. Continue with atorvastatin.  Candee Furbish, MD

## 2014-09-29 NOTE — Patient Instructions (Signed)
Your physician has recommended you make the following change in your medication:  1.) START DILTIAZEM 120 ONCE TABLET ONCE A DAY  Your physician recommends that you schedule a follow-up appointment ON 11/07/14 AT 2:30 PM WITH DR. ALLRED

## 2014-10-03 ENCOUNTER — Telehealth: Payer: Self-pay | Admitting: Neurology

## 2014-10-03 NOTE — Telephone Encounter (Signed)
Please see phone note  

## 2014-10-03 NOTE — Telephone Encounter (Signed)
Patient requesting a return call, regarding conflicting information regarding Rx diltiazem (CARDIZEM CD) 120 MG 24 hr capsule prescribed by Dr. Merrilee Jansky at [facility] and previous Rx for meclizine (ANTIVERT) 25 MG tablet prescribed by Dr.Ahern.  Please call and advise.

## 2014-10-03 NOTE — Telephone Encounter (Signed)
Tried calling patient back twice. Sounds like he picked up the phone but didn't say anything or maybe it was his answering machine. Will try again in the morning. I did not prescribe the Antivert but will try and answer his questions.

## 2014-10-05 NOTE — Telephone Encounter (Signed)
Unable to reach patient.

## 2014-10-05 NOTE — Telephone Encounter (Signed)
Can you please call back patient and see if you can get him on the phone and ask specifically what his question is? Let him know that I did not prescribe his antivert but I will try and answer for him. Thank you.

## 2014-11-03 ENCOUNTER — Encounter: Payer: Self-pay | Admitting: *Deleted

## 2014-11-07 ENCOUNTER — Encounter: Payer: Self-pay | Admitting: Internal Medicine

## 2014-11-07 ENCOUNTER — Ambulatory Visit (INDEPENDENT_AMBULATORY_CARE_PROVIDER_SITE_OTHER): Payer: Medicare Other | Admitting: Internal Medicine

## 2014-11-07 VITALS — BP 148/78 | HR 84 | Ht 69.5 in | Wt 145.2 lb

## 2014-11-07 DIAGNOSIS — I10 Essential (primary) hypertension: Secondary | ICD-10-CM

## 2014-11-07 DIAGNOSIS — I471 Supraventricular tachycardia: Secondary | ICD-10-CM

## 2014-11-07 NOTE — Patient Instructions (Signed)
Your physician recommends that you schedule a follow-up appointment as needed with Dr Rayann Heman and 6 months with Dr Marlou Porch

## 2014-11-07 NOTE — Progress Notes (Signed)
Primary Care Physician: Milagros Evener, MD Referring Physician:  Dr Larwance Sachs is a 80 y.o. male with a h/o HTn and ectopic atrial tachycardia who presents today for EP consultation.  He presented 10/15 to [facility] with symptoms of dizziness and lightheadedness.  He was in an atrial tachycardia at the time.  He also noticed decreased exercise tolerance.  He developed symptoms of presyncope and therefore was evaluated by Dr Marlou Porch 11/15.  He was again found to have 2:1 atrial tachycardia.  He was started on diltiazem.  He has had no further symptoms since that time.  He is referred today for EP consultation.  Today, he denies symptoms of palpitations, chest pain, shortness of breath, orthopnea, PND, lower extremity edema, further dizziness, presyncope, syncope, or neurologic sequela. The patient is tolerating medications without difficulties and is otherwise without complaint today.   Past Medical History  Diagnosis Date  . Hypertension   . Hypercholesteremia   . Gout   . COPD (chronic obstructive pulmonary disease)   . Detached retina     a. s/p surgical correction on the right.  . Prostate cancer     a. s/p TURP.  Victor Clarke Ectopic atrial tachycardia     a. 08/2014 Echo: EF 55-60%, mildly dil LA.  . Parkinson's disease   . Left inguinal hernia     a. s/p repair ~ 10 yrs ago.   Past Surgical History  Procedure Laterality Date  . Colonoscopy w/ polypectomy  2001, 2006  . Hernia repair    . Prostate surgery      Current Outpatient Prescriptions  Medication Sig Dispense Refill  . allopurinol (ZYLOPRIM) 100 MG tablet Take 100 mg by mouth daily.     Victor Clarke aspirin EC 81 MG tablet Take 81 mg by mouth daily.    Victor Clarke atorvastatin (LIPITOR) 40 MG tablet Take 40 mg by mouth daily.    . carbidopa-levodopa (SINEMET) 25-100 MG per tablet Take 0.5 tablets by mouth 3 (three) times daily. 45 tablet 3  . diltiazem (CARDIZEM CD) 120 MG 24 hr capsule Take 1 capsule (120 mg total) by mouth  daily. 30 capsule 2  . lisinopril (PRINIVIL,ZESTRIL) 20 MG tablet Take 20 mg by mouth daily.    . meclizine (ANTIVERT) 25 MG tablet Take 12.5 mg by mouth every 8 (eight) hours as needed for dizziness.     . Omega-3 Fatty Acids (FISH OIL) 1200 MG CAPS Take 2,400 mg by mouth daily.     No current facility-administered medications for this visit.    No Known Allergies  History   Social History  . Marital Status: Widowed    Spouse Name: N/A    Number of Children: 0  . Years of Education: College   Occupational History  . Retired Other   Social History Main Topics  . Smoking status: Former Smoker -- 2.00 packs/day for 22 years    Types: Cigarettes    Quit date: 11/18/1977  . Smokeless tobacco: Never Used  . Alcohol Use: 0.6 oz/week    1 Not specified per week     Comment: 4 oz of gin daily.  . Drug Use: No  . Sexual Activity: No   Other Topics Concern  . Not on file   Social History Narrative   Patient lives at home alone.   Caffeine Use: 1 cup daily    Family History  Problem Relation Age of Onset  . Stroke Father     died @ 74.  Victor Clarke  Heart attack Mother     died @ 25  . Stroke Brother     died in his 72's  . Heart attack Brother     died in his 28's    ROS- All systems are reviewed and negative except as per the HPI above  Physical Exam: Filed Vitals:   11/07/14 1434  BP: 148/78  Pulse: 84  Height: 5' 9.5" (1.765 m)  Weight: 145 lb 3.2 oz (65.862 kg)    GEN- The patient is elderly appearing, alert and oriented x 3 today.   Head- normocephalic, atraumatic Eyes-  Sclera clear, conjunctiva pink Ears- hearing reduced Oropharynx- clear Neck- supple, no JVP Lymph- no cervical lymphadenopathy Lungs- Clear to ausculation bilaterally, normal work of breathing Heart- Regular rate and rhythm,   GI- soft, NT, ND, + BS Extremities- no clubbing, cyanosis, or edema MS- no significant deformity or atrophy Skin- no rash or lesion Psych- euthymic mood, full  affect Neuro- strength and sensation are intact  EKG today reveals sinus rhythm with pacs.  Incomplete RBBB ekg 11/121/5 reveals EAT with 2:1 AV conduction Epic records including Dr Marlou Porch notes are reviewed in detail.   Assessment and Plan:  1. SVT The patient has a h/o sustained, recurrent ectopic atrial tachycardia.  Therapeutic strategies including medicine and ablation were discussed in detail with the patient today. Risk, benefits, and alternatives to EP study and radiofrequency ablation were also discussed in detail today.  At this time, he feels "much better" with diltiazem.  He is not interested in ablation at this time.  Given his advanced age, a more conservative approach is probably reasonable.  He has no indication for anticoagulation with this organized atrial rhythm.  He will contact my office should he wish to proceed with ablation in the future  2. HTN Stable No change required today  Follow-up with Dr Marlou Porch in 6 months I will see as needed going forward

## 2014-11-15 ENCOUNTER — Encounter: Payer: Self-pay | Admitting: Neurology

## 2014-11-15 ENCOUNTER — Ambulatory Visit (INDEPENDENT_AMBULATORY_CARE_PROVIDER_SITE_OTHER): Payer: Medicare Other | Admitting: Neurology

## 2014-11-15 VITALS — BP 134/81 | HR 46 | Temp 97.9°F | Ht 69.0 in | Wt 142.0 lb

## 2014-11-15 DIAGNOSIS — G20 Parkinson's disease: Secondary | ICD-10-CM

## 2014-11-15 DIAGNOSIS — Z9114 Patient's other noncompliance with medication regimen: Secondary | ICD-10-CM

## 2014-11-15 DIAGNOSIS — R42 Dizziness and giddiness: Secondary | ICD-10-CM

## 2014-11-15 NOTE — Progress Notes (Signed)
[facility]    Provider:  Dr Jaynee Eagles Referring Provider: Aretta Nip, MD Primary Care Physician:  Milagros Evener, MD  CC:  Dizziness  Interval History 11/15/2014:  Victor Clarke is a 80 y.o. male here as a follow up for Dizziness  The dizzyness has improved. Has not had any falls. He has been following with cardiologist. Sometimes he feels a little mild lightheaded when getting up, if he sits for 30 seconds it gets better. The Sinemet never helped. He is very active, he seems to be getting around fine. He has not had any presyncopal episodes or syncopal episodes since last being seen. He is trying to take his medication daily. He is missing some doses but tries to keep up as best he can. He got pill sorter to help. When asked about his memory, he says "I am slowing down, I am 86". Denies any significant memory changes. He writes down his appointments, not getting lost when he goes out, not forgeting to pay bills, denies having some incidents of things like turning off the stove in the house or doing anything that would put him in danger. Last night in fact he double checked the burners. He doesn't think he needs more medication, doesn't want anything for memory. He feels more confident about his situation. Has some tremors but they are manageable. Not interfering with daily life except maybe his handwriting a little. He is aware of SOB when he is walking but walking is fine. He doesn't know if he had the MRI of his brain complete.   At last appointment he was referred to cardiology because dizzyness was suspected to be heart related. He was diagnosed with Ectopic atrial tachycardia and started on AV nodal blockade, diltiazem 120 mg CD once a day. He was subsequently seen by Dr. Rayann Heman to discuss EP study and radioablation.   Initial Appointment 09/29/2014: HPI: Victor Clarke is a 80 y.o. male here as a referral from Dr. Radene Ou for balance and gait  disorder  Patient continues to have episodes of dizziness. Worsening. He fell yesterday at home because he felt dizzy. He was originally seen here in the office and displayed parkinsonian symptoms on clinical exam however sinemet did not help and he has stopped taking the Sinemet.And his chief complaint was dizziness, he is not concerned with gait or balance. The dizziness is worsening. He was standing at the counter at the kitchen yesterday and a dizzy spell came over him and he flopped down on the floor. Episode lasted 3 minutes. He was clutching the counter just trying to stand. No loss of consciousness. The room was not spinning. Can't describe what he feels is dizzy. "I was just struggling to get up". He was conscious. Fell on his knees. He was SOB during the episode. He sat in a chair and then all of a sudden he felt better.   This morning he was dizzy but not enough to fall. He was at the counter at the kitchen standing making coffee. He lives alone in his home. Dizziness lasted less than a minute then resolved. No chest pain. He feels his heart racing occasionally. He is having SOB with the dizziness.  He says he is missing medication. He forgets. He misses his schedule of some of the drugs. Sometimes "I just don't think to take it". He has no family to help him. He doesn't want to live in an assisted living facility. Family is not near by, his brother is in  [place] and niece lives in [place]. He was going to buy a pill Environmental education officer today as suggested by his niece. He has not taken his medication yet today. "I just didn't think about it" referring to taking his medication.  Reviewed notes, labs and imaging from outside physicians, which showed; Patient was admitted on 10/30 for SOB and dizziness. While being evaluated in emergency department noted short run of apparent A. fib with RVR which self resolved. [facility] 11.1.2015: There was moderate concentric hypertrophy. Systolic function was normal.  The estimated ejection fraction was in the range of 55% to 60%. Wall motion was normal; there were no regional wall motion abnormalities. Left ventricular diastolic function parameters were normal. Aortic valve: Valve area (Vmax): 2.42 cm^2. - Mitral valve: Calcified annulus. - Left atrium: The atrium was mildly dilated. Ct angi chest no PE. No events on telemetry, 2 12 leads EKG no change.  Review of Systems: Patient complains of symptoms per HPI as well as the following symptoms: SOB, dizziness. Denies CP. Pertinent negatives per HPI. All others negative.   History   Social History  . Marital Status: Widowed    Spouse Name: N/A    Number of Children: 0  . Years of Education: College   Occupational History  . Retired Other   Social History Main Topics  . Smoking status: Former Smoker -- 2.00 packs/day for 22 years    Types: Cigarettes    Quit date: 11/18/1977  . Smokeless tobacco: Never Used  . Alcohol Use: 0.6 oz/week    1 Not specified per week     Comment: 4 oz of gin daily.  . Drug Use: No  . Sexual Activity: No   Other Topics Concern  . Not on file   Social History Narrative   Patient lives at home alone.   Caffeine Use: 1 cup daily    Family History  Problem Relation Age of Onset  . Stroke Father     died @ 58.  Marland Kitchen Heart attack Mother     died @ 2  . Stroke Brother     died in his 47's  . Heart attack Brother     died in his 9's    Past Medical History  Diagnosis Date  . Hypertension   . Hypercholesteremia   . Gout   . COPD (chronic obstructive pulmonary disease)   . Detached retina     a. s/p surgical correction on the right.  . Prostate cancer     a. s/p TURP.  Marland Kitchen Ectopic atrial tachycardia     a. 08/2014 Echo: EF 55-60%, mildly dil LA.  . Parkinson's disease   . Left inguinal hernia     a. s/p repair ~ 10 yrs ago.    Past Surgical History  Procedure Laterality Date  . Colonoscopy w/ polypectomy  2001, 2006  . Hernia repair    .  Prostate surgery      Current Outpatient Prescriptions  Medication Sig Dispense Refill  . allopurinol (ZYLOPRIM) 100 MG tablet Take 100 mg by mouth daily.     Marland Kitchen aspirin EC 81 MG tablet Take 81 mg by mouth daily.    Marland Kitchen atorvastatin (LIPITOR) 40 MG tablet Take 40 mg by mouth daily.    . carbidopa-levodopa (SINEMET) 25-100 MG per tablet Take 0.5 tablets by mouth 3 (three) times daily. 45 tablet 3  . diltiazem (CARDIZEM CD) 120 MG 24 hr capsule Take 1 capsule (120 mg total) by mouth daily. 30 capsule  2  . lisinopril (PRINIVIL,ZESTRIL) 20 MG tablet Take 20 mg by mouth daily.    . meclizine (ANTIVERT) 25 MG tablet Take 12.5 mg by mouth every 8 (eight) hours as needed for dizziness.     . Omega-3 Fatty Acids (FISH OIL) 1200 MG CAPS Take 2,400 mg by mouth daily.     No current facility-administered medications for this visit.    Allergies as of 11/15/2014  . (No Known Allergies)    Vitals: BP 134/81 mmHg  Pulse 46  Temp(Src) 97.9 F (36.6 C) (Oral)  Ht 5\' 9"  (1.753 m)  Wt 142 lb (64.411 kg)  BMI 20.96 kg/m2 Last Weight:  Wt Readings from Last 1 Encounters:  11/15/14 142 lb (64.411 kg)   Last Height:   Ht Readings from Last 1 Encounters:  11/15/14 5\' 9"  (1.753 m)       Physical exam: Exam: Gen: NAD, conversant, well nourised, well groomed  CV: Originally tachycardic and irregular. Eyes: Conjunctivae clear without exudates or hemorrhage  Neuro: NO CHANGES ON EXAM Detailed Neurologic Exam  Speech:  Speech is normal; fluent and spontaneous with normal comprehension.  Cognition:  The patient is oriented to person, place, and time;  Cranial Nerves:  The pupils are equal, round, and reactive to light. Visual fields are full to finger confrontation. Extraocular movements are intact. Trigeminal sensation is intact and the muscles of mastication are normal. The face is symmetric. The palate elevates in the midline. Voice is normal. Shoulder shrug is  normal. The tongue has normal motion without fasciculations.   Coordination:  Normal finger to nose and heel to shin. Good RAM bilaterally, finger tap. Difficulty with tandem.  Gait:  Good stride, turn. Mildly reduced arm swing.  Motor Observation:  Left > right high-amplitude low-frequency resting and postural tremor Tone:  Left arm and hand cogwheeling, increased with facilitation  Posture:  Posture is normal.    Strength:  Strength is V/V in the upper and lower limbs.    Sensation: intact to LT   Reflex Exam:  DTR's:  Brisk throughout Toes:  The toes are downgoing bilaterally.  Clonus:  Clonus is absent.      Assessment/Plan: 80 year old male who is referred from his physician for balance and gait evaluation. He has mostly complained about episodes of dizziness. At last appointment his heart rate was 166 and irregular initially and then becomes regular. He lives alone, is a poor historian and has cognitive deficits and is noncompliant. At last appointment he was referred to cardiology because dizzyness was suspected to be heart related. Much appreciate them seeing patient that day. He was diagnosed with Ectopic atrial tachycardia and started on AV nodal blockade, diltiazem 120 mg CD once a day. He was subsequently seen by Dr. Rayann Heman to discuss EP study and radioablation.   He does appear parkinsonian on exam. He demonstrates a left>right high amlitude, low frequency resting and postural tremor tremor, masked facies, left arm cogwheeling which worsens with facilitation and brisk reflexes throughout. However this appears mild, the Sinemet didn't seem to help per patient and this is not his major concern. He has already stopped the Sinemet.   Non compliance: I recommended looking in assisted living. He declines. He did get a pill organizer however. Continue to use pill organizer.  Cognitive dysfunction: will continue to follow. He denies  significant impairment  Dizziness: improved, continue diltiazem  08/29/14: MMSE 24/30 with loss of following points, sent by primary care dr Radene Ou.   Orientation:  floor of building -1 Attention and calculation -1 Can't repeat 3 words to remember -3 Copy design -1       Sarina Ill, MD  [facility] [address] [place], [place] [address]  Phone [phone] Fax [phone]  A total of 30 minutes was spent in with this patient. Over half this time was spent on counseling patient on the diagnosis and different therapeutic options available.

## 2014-11-30 ENCOUNTER — Telehealth: Payer: Self-pay | Admitting: Cardiology

## 2014-11-30 NOTE — Telephone Encounter (Signed)
New message      Should pt still be taking his diltiazem.  It is not on any of his medication list

## 2014-11-30 NOTE — Telephone Encounter (Signed)
Pt is to continue on Diltiazem as listed.  No changes.  [facility] is aware.

## 2014-12-28 ENCOUNTER — Encounter (HOSPITAL_COMMUNITY): Payer: Self-pay

## 2014-12-28 ENCOUNTER — Emergency Department (HOSPITAL_COMMUNITY): Payer: Medicare Other

## 2014-12-28 ENCOUNTER — Other Ambulatory Visit: Payer: Self-pay

## 2014-12-28 ENCOUNTER — Emergency Department (HOSPITAL_COMMUNITY)
Admission: EM | Admit: 2014-12-28 | Discharge: 2014-12-28 | Disposition: A | Discharge status: Home / Self Care | Payer: Medicare Other | Attending: Emergency Medicine | Admitting: Emergency Medicine

## 2014-12-28 DIAGNOSIS — Z8546 Personal history of malignant neoplasm of prostate: Secondary | ICD-10-CM | POA: Insufficient documentation

## 2014-12-28 DIAGNOSIS — J449 Chronic obstructive pulmonary disease, unspecified: Secondary | ICD-10-CM | POA: Insufficient documentation

## 2014-12-28 DIAGNOSIS — M109 Gout, unspecified: Secondary | ICD-10-CM | POA: Diagnosis not present

## 2014-12-28 DIAGNOSIS — Z7982 Long term (current) use of aspirin: Secondary | ICD-10-CM | POA: Diagnosis not present

## 2014-12-28 DIAGNOSIS — E780 Pure hypercholesterolemia: Secondary | ICD-10-CM | POA: Insufficient documentation

## 2014-12-28 DIAGNOSIS — S51012A Laceration without foreign body of left elbow, initial encounter: Secondary | ICD-10-CM

## 2014-12-28 DIAGNOSIS — Y92009 Unspecified place in unspecified non-institutional (private) residence as the place of occurrence of the external cause: Secondary | ICD-10-CM | POA: Diagnosis not present

## 2014-12-28 DIAGNOSIS — Y998 Other external cause status: Secondary | ICD-10-CM | POA: Insufficient documentation

## 2014-12-28 DIAGNOSIS — S51002A Unspecified open wound of left elbow, initial encounter: Secondary | ICD-10-CM | POA: Insufficient documentation

## 2014-12-28 DIAGNOSIS — Y9389 Activity, other specified: Secondary | ICD-10-CM | POA: Insufficient documentation

## 2014-12-28 DIAGNOSIS — F10120 Alcohol abuse with intoxication, uncomplicated: Secondary | ICD-10-CM | POA: Diagnosis not present

## 2014-12-28 DIAGNOSIS — S0512XA Contusion of eyeball and orbital tissues, left eye, initial encounter: Secondary | ICD-10-CM | POA: Diagnosis not present

## 2014-12-28 DIAGNOSIS — Z87891 Personal history of nicotine dependence: Secondary | ICD-10-CM | POA: Diagnosis not present

## 2014-12-28 DIAGNOSIS — W19XXXA Unspecified fall, initial encounter: Secondary | ICD-10-CM

## 2014-12-28 DIAGNOSIS — Z8719 Personal history of other diseases of the digestive system: Secondary | ICD-10-CM | POA: Insufficient documentation

## 2014-12-28 DIAGNOSIS — S0012XA Contusion of left eyelid and periocular area, initial encounter: Secondary | ICD-10-CM

## 2014-12-28 DIAGNOSIS — I10 Essential (primary) hypertension: Secondary | ICD-10-CM | POA: Insufficient documentation

## 2014-12-28 DIAGNOSIS — G20 Parkinson's disease: Secondary | ICD-10-CM | POA: Diagnosis not present

## 2014-12-28 DIAGNOSIS — W1839XA Other fall on same level, initial encounter: Secondary | ICD-10-CM | POA: Diagnosis not present

## 2014-12-28 DIAGNOSIS — F10920 Alcohol use, unspecified with intoxication, uncomplicated: Secondary | ICD-10-CM

## 2014-12-28 DIAGNOSIS — Z79899 Other long term (current) drug therapy: Secondary | ICD-10-CM | POA: Insufficient documentation

## 2014-12-28 DIAGNOSIS — S59902A Unspecified injury of left elbow, initial encounter: Secondary | ICD-10-CM | POA: Diagnosis present

## 2014-12-28 LAB — URINALYSIS, ROUTINE W REFLEX MICROSCOPIC
Bilirubin Urine: NEGATIVE
GLUCOSE, UA: NEGATIVE mg/dL
HGB URINE DIPSTICK: NEGATIVE
Ketones, ur: NEGATIVE mg/dL
LEUKOCYTES UA: NEGATIVE
Nitrite: NEGATIVE
Protein, ur: NEGATIVE mg/dL
Specific Gravity, Urine: 1.009 (ref 1.005–1.030)
Urobilinogen, UA: 1 mg/dL (ref 0.0–1.0)
pH: 5.5 (ref 5.0–8.0)

## 2014-12-28 LAB — I-STAT TROPONIN, ED: Troponin i, poc: 0.02 ng/mL (ref 0.00–0.08)

## 2014-12-28 LAB — ETHANOL: Alcohol, Ethyl (B): 255 mg/dL — ABNORMAL HIGH (ref 0–9)

## 2014-12-28 NOTE — ED Provider Notes (Signed)
CSN: [id number]     Arrival date & time 12/28/14  1138 History   First MD Initiated Contact with Patient 12/28/14 1240     Chief Complaint  Patient presents with  . Fall      HPI  Patient presents for evaluation after a fall at home. He states that he called 911 because he fell at home and has a tear to his skin on his left hand. He is evasive about his alcohol intake.  Did not lay on his floor was not incapacitated. States he remembers drinking last night and again this morning before his fall. His armor hitting his head but has a contusion noted on his left face.  Past Medical History  Diagnosis Date  . Hypertension   . Hypercholesteremia   . Gout   . COPD (chronic obstructive pulmonary disease)   . Detached retina     a. s/p surgical correction on the right.  . Prostate cancer     a. s/p TURP.  Marland Kitchen Ectopic atrial tachycardia     a. 08/2014 Echo: EF 55-60%, mildly dil LA.  . Parkinson's disease   . Left inguinal hernia     a. s/p repair ~ 10 yrs ago.   Past Surgical History  Procedure Laterality Date  . Colonoscopy w/ polypectomy  2001, 2006  . Hernia repair    . Prostate surgery     Family History  Problem Relation Age of Onset  . Stroke Father     died @ 66.  Marland Kitchen Heart attack Mother     died @ 19  . Stroke Brother     died in his 53's  . Heart attack Brother     died in his 19's   History  Substance Use Topics  . Smoking status: Former Smoker -- 2.00 packs/day for 22 years    Types: Cigarettes    Quit date: 11/18/1977  . Smokeless tobacco: Never Used  . Alcohol Use: 0.6 oz/week    1 Standard drinks or equivalent per week     Comment: 4 oz of gin daily.    Review of Systems  Constitutional: Negative for fever, chills, diaphoresis, appetite change and fatigue.  HENT: Negative for mouth sores, sore throat and trouble swallowing.   Eyes: Negative for visual disturbance.  Respiratory: Negative for cough, chest tightness, shortness of breath and wheezing.     Cardiovascular: Negative for chest pain.  Gastrointestinal: Negative for nausea, vomiting, abdominal pain, diarrhea and abdominal distention.  Endocrine: Negative for polydipsia, polyphagia and polyuria.  Genitourinary: Negative for dysuria, frequency and hematuria.  Musculoskeletal: Negative for gait problem.  Skin: Negative for color change, pallor and rash.       Skin tear to the left elbow.  Neurological: Negative for dizziness, syncope, light-headedness and headaches.  Hematological: Does not bruise/bleed easily.  Psychiatric/Behavioral: Negative for behavioral problems and confusion.      Allergies  Review of patient's allergies indicates no known allergies.  Home Medications   Prior to Admission medications   Medication Sig Start Date End Date Taking? Authorizing Provider  aspirin EC 81 MG tablet Take 81 mg by mouth daily.   Yes Historical Provider, MD  atorvastatin (LIPITOR) 40 MG tablet Take 40 mg by mouth daily.   Yes Historical Provider, MD  carbidopa-levodopa (SINEMET) 25-100 MG per tablet Take 0.5 tablets by mouth 3 (three) times daily. 08/18/14  Yes Melvenia Beam, MD  diltiazem (CARDIZEM CD) 120 MG 24 hr capsule Take 1 capsule (120  mg total) by mouth daily. 09/29/14  Yes Rogelia Mire, NP  lisinopril (PRINIVIL,ZESTRIL) 20 MG tablet Take 20 mg by mouth daily.   Yes Historical Provider, MD  meclizine (ANTIVERT) 25 MG tablet Take 12.5 mg by mouth every 8 (eight) hours as needed for dizziness.    Yes Historical Provider, MD  Omega-3 Fatty Acids (FISH OIL) 1200 MG CAPS Take 2,400 mg by mouth daily.   Yes Historical Provider, MD  allopurinol (ZYLOPRIM) 100 MG tablet Take 100 mg by mouth daily.     Historical Provider, MD   BP 125/61 mmHg  Pulse 60  Temp(Src) 99 F (37.2 C) (Oral)  Resp 16  SpO2 100% Physical Exam  Constitutional: He is oriented to person, place, and time. He appears well-developed and well-nourished. No distress.  HENT:  Head: Normocephalic.     Eyes: Conjunctivae are normal. Pupils are equal, round, and reactive to light. No scleral icterus.  Neck: Normal range of motion. Neck supple. No thyromegaly present.  Cardiovascular: Normal rate and regular rhythm.  Exam reveals no gallop and no friction rub.   No murmur heard. Pulmonary/Chest: Effort normal and breath sounds normal. No respiratory distress. He has no wheezes. He has no rales.  Abdominal: Soft. Bowel sounds are normal. He exhibits no distension. There is no tenderness. There is no rebound.  Musculoskeletal: Normal range of motion.  Neurological: He is alert and oriented to person, place, and time.  Skin: Skin is warm and dry. No rash noted.  Skin tear left elbow. Normal range of motion of the left elbow. Able to flex extend pronate and supinate.  Psychiatric: He has a normal mood and affect. His behavior is normal.    ED Course  Procedures (including critical care time) Labs Review Labs Reviewed  ETHANOL - Abnormal; Notable for the following:    Alcohol, Ethyl (B) 255 (*)    All other components within normal limits  URINALYSIS, ROUTINE W REFLEX MICROSCOPIC  CBC WITH DIFFERENTIAL/PLATELET  BASIC METABOLIC PANEL  I-STAT TROPOININ, ED    Imaging Review Dg Elbow Complete Left  12/28/2014   CLINICAL DATA:  Left elbow trauma.  Fall.  EXAM: LEFT ELBOW - COMPLETE 3+ VIEW  COMPARISON:  None.  FINDINGS: No evidence of fracture of the ulna or humerus. The radial head is normal. No joint effusion.  IMPRESSION: No fracture or dislocation.   Electronically Signed   By: Suzy Bouchard M.D.   On: 12/28/2014 14:05   Ct Head Wo Contrast  12/28/2014   CLINICAL DATA:  Fall to floor with memory loss.  Initial encounter.  EXAM: CT HEAD WITHOUT CONTRAST  TECHNIQUE: Contiguous axial images were obtained from the base of the skull through the vertex without intravenous contrast.  COMPARISON:  None.  FINDINGS: Skull and Sinuses:Negative for fracture or destructive process. The  mastoids, middle ears, and imaged paranasal sinuses are clear.  Orbits: Implant lateral to the right globe, favor a glaucoma drainage device. No traumatic findings.  Brain: No evidence of acute infarction, hemorrhage, hydrocephalus, or mass lesion/mass effect. Generalized brain atrophy which is moderate to advanced.  IMPRESSION: 1. No intracranial injury or fracture. 2. Generalized brain atrophy.   Electronically Signed   By: Monte Fantasia M.D.   On: 12/28/2014 13:57     EKG Interpretation None      MDM   Final diagnoses:  Fall  Skin tear of elbow without complication, left, initial encounter  Periorbital ecchymosis, left, initial encounter  Alcohol intoxication, uncomplicated  Patient has a skin tear to his elbow before range of motion. And a normal x-ray. Contusion around his left eye. No additional signs of skull fracture. Normal head CT. He is awake alert oriented lucid. Is able to sit independently. Alcohol level noted at 255. He states he does not have family that can come get him that he would be comfortable calling a cab. We will ensure that he is able to ambulate. Think it will be appropriate for discharge home.    Tanna Furry, MD 12/28/14 [phone]

## 2014-12-28 NOTE — ED Notes (Signed)
Bed: [facility] Expected date:  Expected time:  Means of arrival:  Comments: Ems- ETOH?

## 2014-12-28 NOTE — ED Notes (Signed)
He awoke to find himself on his floor, not recalling how he got there.  He phoned EMS (He lives alone).  He admits to having "1 1/2 martinis this morning".  He smells of ETOH.  He is cooperative, and in no distress.  He has a skin tear at post. Left elbow area.  He denies pain; and was able to stand and take a couple of steps to the stretcher.

## 2014-12-28 NOTE — Progress Notes (Signed)
CSW was notified by nurse that pt does not have transportation home. CSW consulted with pt who confirms that he does not have a way home. Pt was BIB ambulance.  Pt lives at [place] [id number]. CSW provided pt with a taxi voucher home.  Willette Brace [phone] ED CSW 12/28/2014 6:55 PM

## 2014-12-28 NOTE — ED Notes (Signed)
He has returned from CT/x-ray about 30 min. Ago; and is sleeping soundly.  His skin is normal, warm and dry and he is breathing normally.

## 2014-12-28 NOTE — ED Notes (Signed)
I have just walked him to our lobby; where he is waiting with the taxi voucher provided by our Education officer, museum.  He ambulates without difficulty.

## 2014-12-28 NOTE — ED Provider Notes (Signed)
  Physical Exam  BP 121/68 mmHg  Pulse 75  Temp(Src) 98.4 F (36.9 C) (Oral)  Resp 16  SpO2 96%  Physical Exam  ED Course  Procedures  MDM Patient ambulated well. Will catch a cab home.   Wandra Arthurs, MD 12/28/14 [phone]

## 2014-12-28 NOTE — ED Notes (Signed)
He is upset because he states he had "brown pants with my wallet in it".  He arrived here in pajama bottoms which he still has on.  He requests [place] to call a cab.  I have applies a xeroform dressing to the skin tear at left elbow.  He ambulates without difficulty.  He is oriented to all except day/date/time.

## 2014-12-28 NOTE — Discharge Instructions (Signed)
Alcohol Intoxication Alcohol intoxication occurs when you drink enough alcohol that it affects your ability to function. It can be mild or very severe. Drinking a lot of alcohol in a short time is called binge drinking. This can be very harmful. Drinking alcohol can also be more dangerous if you are taking medicines or other drugs. Some of the effects caused by alcohol may include:  Loss of coordination.  Changes in mood and behavior.  Unclear thinking.  Trouble talking (slurred speech).  Throwing up (vomiting).  Confusion.  Slowed breathing.  Twitching and shaking (seizures).  Loss of consciousness. HOME CARE  Do not drive after drinking alcohol.  Drink enough water and fluids to keep your pee (urine) clear or pale yellow. Avoid caffeine.  Only take medicine as told by your doctor. GET HELP IF:  You throw up (vomit) many times.  You do not feel better after a few days.  You frequently have alcohol intoxication. Your doctor can help decide if you should see a substance use treatment counselor. GET HELP RIGHT AWAY IF:  You become shaky when you stop drinking.  You have twitching and shaking.  You throw up blood. It may look bright red or like coffee grounds.  You notice blood in your poop (bowel movements).  You become lightheaded or pass out (faint). MAKE SURE YOU:   Understand these instructions.  Will watch your condition.  Will get help right away if you are not doing well or get worse. Document Released: 04/22/2008 Document Revised: 07/07/2013 Document Reviewed: 04/09/2013 [facility] Patient Information 2015 [place], [place]. This information is not intended to replace advice given to you by your health care provider. Make sure you discuss any questions you have with your health care provider.  Eye Contusion  An eye contusion is a deep bruise of the eye. It is often called a "black eye." Contusions happen when an injury causes bleeding under the skin. Signs of  bruising include pain, puffiness (swelling), and discolored skin. The contusion may turn blue, purple, or yellow. A black eye can be very serious and can affect the eyeball and sight. HOME CARE  Put ice on the injured area.  Put ice in a plastic bag.  Place a towel between your skin and the bag.  Leave the ice on for 15-20 minutes, 03-04 times a day.  If there is no injury to the eye, you may keep doing normal activities.  Wear sunglasses if bright lights bother you.  Sleep with your head raised (elevated) to help with discomfort.  Only take medicines as told by your doctor. GET HELP RIGHT AWAY IF:  You notice any vision loss.  You see two of everything (double vision).  You feel sick to your stomach (nauseous).  You feel dizzy, sleepy, or like you will pass out (faint).  You have any fluid coming from your eye or nose.  You have puffiness and bruising that does not fade. MAKE SURE YOU:   Understand these instructions.  Will watch your condition.  Will get help right away if you are not doing well or get worse. Document Released: 10/24/2011 Document Revised: 01/27/2012 Document Reviewed: 10/24/2011 [facility] Patient Information 2015 [place], [place]. This information is not intended to replace advice given to you by your health care provider. Make sure you discuss any questions you have with your health care provider.  Skin Tear Care A skin tear is when the top layer of skin peels off. To repair the skin, your doctor may use:  Tape.  Skin adhesive strips. HOME CARE  Change bandages (dressings) once a day or as told by your doctor.  Gently clean the area with salt (saline) solution or with a mild soap and water.  Do not rub the injured skin dry. Let the area air dry.  Put petroleum jelly or antibiotic cream on the tear. Do not allow a scab to form.  If the bandage sticks, moisten it with warm soapy water and remove it.  Protect the injured skin until it has  healed.  Only take medicine as told by your doctor.  Take showers or baths using warm soapy water. Apply a new bandage after the shower or bath.  Keep all doctor visits as told. GET HELP RIGHT AWAY IF:   You have redness, puffiness (swelling), or more pain in the tear.  You have ayellowish-white fluid (pus) coming from the tear.  You have chills.  You have a red streak that goes away from the tear.  You have a bad smell coming from the tear or bandage.  You have a fever or lasting symptoms for more than 2-3 days.  You have a fever and your symptoms suddenly get worse. MAKE SURE YOU:   Understand these instructions.  Will watch this condition.  Will get help right away if you are not doing well or get worse. Document Released: 08/13/2008 Document Revised: 07/29/2012 Document Reviewed: 05/18/2012 [facility] Patient Information 2015 [place], [place]. This information is not intended to replace advice given to you by your health care provider. Make sure you discuss any questions you have with your health care provider.

## 2014-12-28 NOTE — ED Notes (Signed)
Patient was given a urinal and he said he could not go

## 2014-12-28 NOTE — ED Notes (Signed)
Urinal was given to patient he will try to go soon

## 2014-12-30 NOTE — ED Notes (Signed)
I was in pt chart to look up who he was suppose to follow up with, it was Dr Zadie Rhine, [place]...klj

## 2015-01-02 ENCOUNTER — Telehealth (HOSPITAL_COMMUNITY): Payer: Self-pay

## 2015-03-13 ENCOUNTER — Telehealth: Payer: Self-pay

## 2015-03-13 NOTE — Telephone Encounter (Signed)
Called and spoke to patient he is rescheduled for this coming Wednesday with Dr. Jaynee Eagles.

## 2015-03-15 ENCOUNTER — Encounter: Payer: Self-pay | Admitting: Neurology

## 2015-03-15 ENCOUNTER — Ambulatory Visit (INDEPENDENT_AMBULATORY_CARE_PROVIDER_SITE_OTHER): Payer: Medicare Other | Admitting: Neurology

## 2015-03-15 VITALS — BP 120/78 | HR 63 | Ht 69.0 in | Wt 141.0 lb

## 2015-03-15 DIAGNOSIS — G20 Parkinson's disease: Secondary | ICD-10-CM | POA: Diagnosis not present

## 2015-03-15 NOTE — Progress Notes (Signed)
[facility]    Provider:  Dr Jaynee Eagles Referring Provider: Radene Ou Bill Salinas, MD Primary Care Physician:  Milagros Evener, MD  CC:  Dizziness  HPI:  Victor Clarke is a 81 y.o. male here as a follow up  No more syncopal episodes. No dizziness anymore. He is not remembering to take his medications. No taking the Sinemet, not interested in taking it. Tremor isn' t bothering him. Doesn't affect him. He takes his time when he gets out of bed. He is not interested in coming back unless he needs to.   Interval History 11/15/2014: Victor Clarke is a 81 y.o. male here as a follow up for Dizziness  The dizzyness has improved. Has not had any falls. He has been following with cardiologist. Sometimes he feels a little mild lightheaded when getting up, if he sits for 30 seconds it gets better. The Sinemet never helped. He is very active, he seems to be getting around fine. He has not had any presyncopal episodes or syncopal episodes since last being seen. He is trying to take his medication daily. He is missing some doses but tries to keep up as best he can. He got pill sorter to help. When asked about his memory, he says "I am slowing down, I am 86". Denies any significant memory changes. He writes down his appointments, not getting lost when he goes out, not forgeting to pay bills, denies having some incidents of things like turning off the stove in the house or doing anything that would put him in danger. Last night in fact he double checked the burners. He doesn't think he needs more medication, doesn't want anything for memory. He feels more confident about his situation. Has some tremors but they are manageable. Not interfering with daily life except maybe his handwriting a little. He is aware of SOB when he is walking but walking is fine. He doesn't know if he had the MRI of his brain complete.   At last appointment he was referred to cardiology because dizzyness was suspected to  be heart related. He was diagnosed with Ectopic atrial tachycardia and started on AV nodal blockade, diltiazem 120 mg CD once a day. He was subsequently seen by Dr. Rayann Heman to discuss EP study and radioablation.   Initial Appointment 09/29/2014: HPI: Victor Clarke is a 81 y.o. male here as a referral from Dr. Radene Ou for balance and gait disorder  Patient continues to have episodes of dizziness. Worsening. He fell yesterday at home because he felt dizzy. He was originally seen here in the office and displayed parkinsonian symptoms on clinical exam however sinemet did not help and he has stopped taking the Sinemet.And his chief complaint was dizziness, he is not concerned with gait or balance. The dizziness is worsening. He was standing at the counter at the kitchen yesterday and a dizzy spell came over him and he flopped down on the floor. Episode lasted 3 minutes. He was clutching the counter just trying to stand. No loss of consciousness. The room was not spinning. Can't describe what he feels is dizzy. "I was just struggling to get up". He was conscious. Fell on his knees. He was SOB during the episode. He sat in a chair and then all of a sudden he felt better.   This morning he was dizzy but not enough to fall. He was at the counter at the kitchen standing making coffee. He lives alone in his home. Dizziness lasted less than a minute  then resolved. No chest pain. He feels his heart racing occasionally. He is having SOB with the dizziness.  He says he is missing medication. He forgets. He misses his schedule of some of the drugs. Sometimes "I just don't think to take it". He has no family to help him. He doesn't want to live in an assisted living facility. Family is not near by, his brother is in [place] and niece lives in [place]. He was going to buy a pill organizer today as suggested by his niece. He has not taken his medication yet today. "I just didn't think about it" referring to taking  his medication.  Reviewed notes, labs and imaging from outside physicians, which showed; Patient was admitted on 10/30 for SOB and dizziness. While being evaluated in emergency department noted short run of apparent A. fib with RVR which self resolved. ECHo 11.1.2015: There was moderate concentric hypertrophy. Systolic function was normal. The estimated ejection fraction was in the range of 55% to 60%. Wall motion was normal; there were no regional wall motion abnormalities. Left ventricular diastolic function parameters were normal. Aortic valve: Valve area (Vmax): 2.42 cm^2. - Mitral valve: Calcified annulus. - Left atrium: The atrium was mildly dilated. Ct angi chest no PE. No events on telemetry, 2 12 leads EKG no change.  Review of Systems: Patient complains of symptoms per HPI as well as the following symptoms: SOB, dizziness. Denies CP. Pertinent negatives per HPI. All others negative.   History   Social History  . Marital Status: Widowed    Spouse Name: N/A  . Number of Children: 0  . Years of Education: College   Occupational History  . Retired Other   Social History Main Topics  . Smoking status: Former Smoker -- 2.00 packs/day for 22 years    Types: Cigarettes    Quit date: 11/18/1977  . Smokeless tobacco: Never Used  . Alcohol Use: 0.6 oz/week    1 Standard drinks or equivalent per week     Comment: 4 oz of gin daily.  . Drug Use: No  . Sexual Activity: No   Other Topics Concern  . Not on file   Social History Narrative   Patient lives at home alone.   Caffeine Use: 1 cup daily    Family History  Problem Relation Age of Onset  . Stroke Father     died @ 42.  Marland Kitchen Heart attack Mother     died @ 46  . Stroke Brother     died in his 34's  . Heart attack Brother     died in his 49's    Past Medical History  Diagnosis Date  . Hypertension   . Hypercholesteremia   . Gout   . COPD (chronic obstructive pulmonary disease)   . Detached retina     a.  s/p surgical correction on the right.  . Prostate cancer     a. s/p TURP.  Marland Kitchen Ectopic atrial tachycardia     a. 08/2014 Echo: EF 55-60%, mildly dil LA.  . Parkinson's disease   . Left inguinal hernia     a. s/p repair ~ 10 yrs ago.    Past Surgical History  Procedure Laterality Date  . Colonoscopy w/ polypectomy  2001, 2006  . Hernia repair    . Prostate surgery      Current Outpatient Prescriptions  Medication Sig Dispense Refill  . allopurinol (ZYLOPRIM) 100 MG tablet Take 100 mg by mouth daily.     Marland Kitchen  aspirin EC 81 MG tablet Take 81 mg by mouth daily.    Marland Kitchen atorvastatin (LIPITOR) 40 MG tablet Take 40 mg by mouth daily.    . carbidopa-levodopa (SINEMET) 25-100 MG per tablet Take 0.5 tablets by mouth 3 (three) times daily. 45 tablet 3  . diltiazem (CARDIZEM CD) 120 MG 24 hr capsule Take 1 capsule (120 mg total) by mouth daily. 30 capsule 2  . lisinopril (PRINIVIL,ZESTRIL) 20 MG tablet Take 20 mg by mouth daily.    . meclizine (ANTIVERT) 25 MG tablet Take 12.5 mg by mouth every 8 (eight) hours as needed for dizziness.     . Omega-3 Fatty Acids (FISH OIL) 1200 MG CAPS Take 2,400 mg by mouth daily.     No current facility-administered medications for this visit.    Allergies as of 03/15/2015  . (No Known Allergies)    Vitals: BP 120/78 mmHg  Pulse 63  Ht 5\' 9"  (1.753 m)  Wt 141 lb (63.957 kg)  BMI 20.81 kg/m2  SpO2 96% Last Weight:  Wt Readings from Last 1 Encounters:  03/15/15 141 lb (63.957 kg)   Last Height:   Ht Readings from Last 1 Encounters:  03/15/15 5\' 9"  (1.753 m)     Cranial Nerves:  The pupils are equal, round, and reactive to light. Visual fields are full to finger confrontation. Extraocular movements are intact. Trigeminal sensation is intact and the muscles of mastication are normal. The face is symmetric. The palate elevates in the midline. Voice is normal. Shoulder shrug is normal. The tongue has normal motion without fasciculations.    Coordination:  Normal finger to nose and heel to shin. Good RAM bilaterally, finger tap. Difficulty with tandem.  Gait:  Good stride, turn. Mildly reduced arm swing.  Motor Observation:  Left > right high-amplitude low-frequency resting and postural tremor Tone:  Left arm and hand cogwheeling, increased with facilitation  Posture:  Posture is normal.    Strength:  Strength is V/V in the upper and lower limbs.    Sensation: intact to LT   Reflex Exam:  DTR's:  Brisk throughout Toes:  The toes are downgoing bilaterally.  Clonus:  Clonus is absent.      Assessment/Plan: 81 year old male who is referred from his physician for balance and gait evaluation. He has mostly complained about episodes of dizziness. At last appointment his heart rate was 166 and irregular initially and then becomes regular. He lives alone, is a poor historian and has cognitive deficits and is noncompliant. At last appointment he was referred to cardiology because dizzyness was suspected to be heart related. Much appreciate them seeing patient that day. He was diagnosed with Ectopic atrial tachycardia and started on AV nodal blockade, diltiazem 120 mg CD once a day. He was subsequently seen by Dr. Rayann Heman to discuss EP study and radioablation.Dizziness resolved.   He does appear parkinsonian on exam. He demonstrates a left>right high amlitude, low frequency resting and postural tremor tremor, masked facies, left arm cogwheeling which worsens with facilitation and brisk reflexes throughout. However this appears mild, the Sinemet didn't seem to help per patient and this is not his major concern. He has already stopped the Sinemet.   Non compliance: I recommended looking in assisted living. He declines. He did get a pill organizer however. Continue to use pill organizer.  Cognitive dysfunction: will continue to follow. He denies significant impairment  Dizziness: improved,  continue diltiazem  08/29/14: MMSE 24/30 with loss of following points, sent by  primary care dr Radene Ou.   Orientation: floor of building -1 Attention and calculation -1 Can't repeat 3 words to remember -3 Copy design -[place], MD  [facility] [address] [place], [place] [address]  Phone [phone] Fax [phone]  A total of 15 minutes was spent face-to-face with this patient. Over half this time was spent on counseling patient on the PD diagnosis and different diagnostic and therapeutic options available.

## 2015-03-17 ENCOUNTER — Ambulatory Visit: Payer: Medicare Other | Admitting: Neurology

## 2015-04-18 ENCOUNTER — Encounter (HOSPITAL_COMMUNITY): Payer: Self-pay | Admitting: *Deleted

## 2015-04-18 ENCOUNTER — Emergency Department (HOSPITAL_COMMUNITY)
Admission: EM | Admit: 2015-04-18 | Discharge: 2015-04-18 | Disposition: A | Discharge status: Home / Self Care | Payer: Medicare Other | Attending: Emergency Medicine | Admitting: Emergency Medicine

## 2015-04-18 DIAGNOSIS — G20 Parkinson's disease: Secondary | ICD-10-CM | POA: Insufficient documentation

## 2015-04-18 DIAGNOSIS — F10120 Alcohol abuse with intoxication, uncomplicated: Secondary | ICD-10-CM | POA: Insufficient documentation

## 2015-04-18 DIAGNOSIS — M109 Gout, unspecified: Secondary | ICD-10-CM | POA: Insufficient documentation

## 2015-04-18 DIAGNOSIS — Z7982 Long term (current) use of aspirin: Secondary | ICD-10-CM | POA: Insufficient documentation

## 2015-04-18 DIAGNOSIS — Y998 Other external cause status: Secondary | ICD-10-CM | POA: Diagnosis not present

## 2015-04-18 DIAGNOSIS — I10 Essential (primary) hypertension: Secondary | ICD-10-CM | POA: Diagnosis not present

## 2015-04-18 DIAGNOSIS — Z79899 Other long term (current) drug therapy: Secondary | ICD-10-CM | POA: Diagnosis not present

## 2015-04-18 DIAGNOSIS — W1830XA Fall on same level, unspecified, initial encounter: Secondary | ICD-10-CM | POA: Insufficient documentation

## 2015-04-18 DIAGNOSIS — Z8546 Personal history of malignant neoplasm of prostate: Secondary | ICD-10-CM | POA: Diagnosis not present

## 2015-04-18 DIAGNOSIS — Y9289 Other specified places as the place of occurrence of the external cause: Secondary | ICD-10-CM | POA: Insufficient documentation

## 2015-04-18 DIAGNOSIS — F10920 Alcohol use, unspecified with intoxication, uncomplicated: Secondary | ICD-10-CM

## 2015-04-18 DIAGNOSIS — Y9389 Activity, other specified: Secondary | ICD-10-CM | POA: Insufficient documentation

## 2015-04-18 DIAGNOSIS — Z8669 Personal history of other diseases of the nervous system and sense organs: Secondary | ICD-10-CM | POA: Insufficient documentation

## 2015-04-18 DIAGNOSIS — J449 Chronic obstructive pulmonary disease, unspecified: Secondary | ICD-10-CM | POA: Diagnosis not present

## 2015-04-18 DIAGNOSIS — E780 Pure hypercholesterolemia: Secondary | ICD-10-CM | POA: Diagnosis not present

## 2015-04-18 DIAGNOSIS — R42 Dizziness and giddiness: Secondary | ICD-10-CM

## 2015-04-18 DIAGNOSIS — Z043 Encounter for examination and observation following other accident: Secondary | ICD-10-CM | POA: Insufficient documentation

## 2015-04-18 DIAGNOSIS — Z87891 Personal history of nicotine dependence: Secondary | ICD-10-CM | POA: Diagnosis not present

## 2015-04-18 DIAGNOSIS — Z8719 Personal history of other diseases of the digestive system: Secondary | ICD-10-CM | POA: Insufficient documentation

## 2015-04-18 LAB — I-STAT CHEM 8, ED
BUN: 16 mg/dL (ref 6–20)
CHLORIDE: 106 mmol/L (ref 101–111)
Calcium, Ion: 0.99 mmol/L — ABNORMAL LOW (ref 1.13–1.30)
Creatinine, Ser: 1.4 mg/dL — ABNORMAL HIGH (ref 0.61–1.24)
GLUCOSE: 78 mg/dL (ref 65–99)
HCT: 39 % (ref 39.0–52.0)
HEMOGLOBIN: 13.3 g/dL (ref 13.0–17.0)
Potassium: 3.8 mmol/L (ref 3.5–5.1)
Sodium: 141 mmol/L (ref 135–145)
TCO2: 14 mmol/L (ref 0–100)

## 2015-04-18 LAB — CBC
HEMATOCRIT: 35.5 % — AB (ref 39.0–52.0)
Hemoglobin: 12 g/dL — ABNORMAL LOW (ref 13.0–17.0)
MCH: 35.1 pg — AB (ref 26.0–34.0)
MCHC: 33.8 g/dL (ref 30.0–36.0)
MCV: 103.8 fL — AB (ref 78.0–100.0)
PLATELETS: 93 10*3/uL — AB (ref 150–400)
RBC: 3.42 MIL/uL — ABNORMAL LOW (ref 4.22–5.81)
RDW: 15.6 % — ABNORMAL HIGH (ref 11.5–15.5)
WBC: 2.8 10*3/uL — ABNORMAL LOW (ref 4.0–10.5)

## 2015-04-18 LAB — BASIC METABOLIC PANEL
Anion gap: 22 — ABNORMAL HIGH (ref 5–15)
BUN: 18 mg/dL (ref 6–20)
CHLORIDE: 102 mmol/L (ref 101–111)
CO2: 18 mmol/L — ABNORMAL LOW (ref 22–32)
Calcium: 8.8 mg/dL — ABNORMAL LOW (ref 8.9–10.3)
Creatinine, Ser: 0.88 mg/dL (ref 0.61–1.24)
GFR calc Af Amer: >60 mL/min (ref >60)
GFR calc non Af Amer: >60 mL/min (ref >60)
GLUCOSE: 82 mg/dL (ref 65–99)
Potassium: 3.8 mmol/L (ref 3.5–5.1)
Sodium: 142 mmol/L (ref 135–145)

## 2015-04-18 LAB — ETHANOL
ALCOHOL ETHYL (B): 148 mg/dL — AB (ref <5)
Alcohol, Ethyl (B): 290 mg/dL — ABNORMAL HIGH (ref <5)

## 2015-04-18 NOTE — Discharge Instructions (Signed)
Alcohol Intoxication  Alcohol intoxication occurs when the amount of alcohol that a person has consumed impairs his or her ability to mentally and physically function. Alcohol directly impairs the normal chemical activity of the brain. Drinking large amounts of alcohol can lead to changes in mental function and behavior, and it can cause many physical effects that can be harmful.   Alcohol intoxication can range in severity from mild to very severe. Various factors can affect the level of intoxication that occurs, such as the person's age, gender, weight, frequency of alcohol consumption, and the presence of other medical conditions (such as diabetes, seizures, or heart conditions). Dangerous levels of alcohol intoxication may occur when people drink large amounts of alcohol in a short period (binge drinking). Alcohol can also be especially dangerous when combined with certain prescription medicines or "recreational" drugs.  SIGNS AND SYMPTOMS  Some common signs and symptoms of mild alcohol intoxication include:  · Loss of coordination.  · Changes in mood and behavior.  · Impaired judgment.  · Slurred speech.  As alcohol intoxication progresses to more severe levels, other signs and symptoms will appear. These may include:  · Vomiting.  · Confusion and impaired memory.  · Slowed breathing.  · Seizures.  · Loss of consciousness.  DIAGNOSIS   Your health care provider will take a medical history and perform a physical exam. You will be asked about the amount and type of alcohol you have consumed. Blood tests will be done to measure the concentration of alcohol in your blood. In many places, your blood alcohol level must be lower than 80 mg/dL (0.08%) to legally drive. However, many dangerous effects of alcohol can occur at much lower levels.   TREATMENT   People with alcohol intoxication often do not require treatment. Most of the effects of alcohol intoxication are temporary, and they go away as the alcohol naturally  leaves the body. Your health care provider will monitor your condition until you are stable enough to go home. Fluids are sometimes given through an IV access tube to help prevent dehydration.   HOME CARE INSTRUCTIONS  · Do not drive after drinking alcohol.  · Stay hydrated. Drink enough water and fluids to keep your urine clear or pale yellow. Avoid caffeine.    · Only take over-the-counter or prescription medicines as directed by your health care provider.    SEEK MEDICAL CARE IF:   · You have persistent vomiting.    · You do not feel better after a few days.  · You have frequent alcohol intoxication. Your health care provider can help determine if you should see a substance use treatment counselor.  SEEK IMMEDIATE MEDICAL CARE IF:   · You become shaky or tremble when you try to stop drinking.    · You shake uncontrollably (seizure).    · You throw up (vomit) blood. This may be bright red or may look like black coffee grounds.    · You have blood in your stool. This may be bright red or may appear as a black, tarry, bad smelling stool.    · You become lightheaded or faint.    MAKE SURE YOU:   · Understand these instructions.  · Will watch your condition.  · Will get help right away if you are not doing well or get worse.  Document Released: 08/14/2005 Document Revised: 07/07/2013 Document Reviewed: 04/09/2013  ExitCare® Patient Information ©2015 [employer]. This information is not intended to replace advice given to you by your health care provider. Make sure   you discuss any questions you have with your health care provider.

## 2015-04-18 NOTE — ED Notes (Signed)
Bed: OJ50 Expected date:  Expected time:  Means of arrival:  Comments: EMS- 81yo M, dizziness, Hx of same

## 2015-04-18 NOTE — Progress Notes (Signed)
81 yr old [place] male lives at home alone and called 911 this morning stating that he was dizzy and he fell. PMH parkinson Last seen at Neurologist in April 2016 Refer to [facility] chart review notes CM overheard pt and ED RN conversing about 1000 RN noted pt is alert and oriented to self, place, and situation. He does not know the date, day, or time and is constantly confusing day from night. He states he is supposed to take antivert but does not because he does not think he needs it and he also had a martini this morning "because I have one at night most times before I go to bed" No obvious injuries noted. He states he slept on the floor for an hour after the fall. CM spoke with pt to see if community resources needed- DME, home health services, senior services Pt denies need for all Reports having a walker at home Refused offer of w/c.  Reports [facility] Go was seeing him until about 3-4 weeks but he does not feel he needs them at this time. CM encouraged pt to choose the option of more home health services with assist of his pcp prn.  He confirmed he would call pcp is if felt he needed to resume/restart home services.   Pt confirmed he had a drink on evening of 04/17/15 Etoh level 290 in [facility] ED. Pt reports minimal support system His brother listed in EPIC lives in [place], his niece lives in [place] and his mother and wife (of 50+ yrs) are deceased.  CM discussed [facility] resources of [place] availability Pt states he likes to read.  Cm provided empathy and encourage to pt Pt pleasant and voiced appreciation of Cm visiting him. ED RN updated

## 2015-04-18 NOTE — Progress Notes (Signed)
CSW was notified by nurse that the pt has transportation issues.CSW met with pt at bedside. CSW provided the pt with a taxi voucher to get home.   There was no family present. Patient confirms that he comes from Home and lives there alone in [place]. Also, pt confirms that he presents to [facility] due to fall.   Patient states that he does not fall often. Patient informed CSW that he does not have a good support system in [place].   , LCSWA [phone] ED CSW 04/18/2015 10:11 PM    

## 2015-04-18 NOTE — ED Notes (Signed)
Patient is resting, I will update vitals when he wakes up

## 2015-04-18 NOTE — ED Notes (Signed)
Victor Clarke lives at home alone and called 911 this morning stating that he was dizzy and he fell. The patient is alert and oriented to self, place, and situation. He does not know the date, day, or time and is constantly confusing day from night. He states he is supposed to take antivert but does not because he does not think he needs it and he also had a martini this morning "because I have one at night most times before I go to bed" No obvious injuries noted. He states he slept on the floor for an hour after the fall.

## 2015-04-18 NOTE — ED Provider Notes (Addendum)
CSN: [id number]     Arrival date & time 04/18/15  1308 History   First MD Initiated Contact with Patient 04/18/15 0840     Chief Complaint  Patient presents with  . Fall  . Dizziness     (Consider location/radiation/quality/duration/timing/severity/associated sxs/prior Treatment) HPI Comments: Patient is an 81 year old male with history of hypertension, COPD, and Parkinson's disease. He was brought by EMS for evaluation of dizziness and fall that occurred last night. She reports he has been off balance for quite some time, then fell yesterday evening. He appears somewhat disoriented and does not give a consistent, reliable history. He does report to drinking several martinis per day.  Patient is a 81 y.o. male presenting with fall. The history is provided by the patient.  Fall This is a new problem. The current episode started 1 to 2 hours ago. The problem occurs constantly. The problem has not changed since onset.Pertinent negatives include no abdominal pain. Nothing aggravates the symptoms. Nothing relieves the symptoms. He has tried nothing for the symptoms. The treatment provided no relief.    Past Medical History  Diagnosis Date  . Hypertension   . Hypercholesteremia   . Gout   . COPD (chronic obstructive pulmonary disease)   . Detached retina     a. s/p surgical correction on the right.  . Prostate cancer     a. s/p TURP.  Marland Kitchen Ectopic atrial tachycardia     a. 08/2014 Echo: EF 55-60%, mildly dil LA.  . Parkinson's disease   . Left inguinal hernia     a. s/p repair ~ 10 yrs ago.   Past Surgical History  Procedure Laterality Date  . Colonoscopy w/ polypectomy  2001, 2006  . Hernia repair    . Prostate surgery     Family History  Problem Relation Age of Onset  . Stroke Father     died @ 91.  Marland Kitchen Heart attack Mother     died @ 57  . Stroke Brother     died in his 74's  . Heart attack Brother     died in his 38's   History  Substance Use Topics  . Smoking status:  Former Smoker -- 2.00 packs/day for 22 years    Types: Cigarettes    Quit date: 11/18/1977  . Smokeless tobacco: Never Used  . Alcohol Use: 0.6 oz/week    1 Standard drinks or equivalent per week     Comment: 4 oz of gin daily.    Review of Systems  Gastrointestinal: Negative for abdominal pain.  All other systems reviewed and are negative.     Allergies  Review of patient's allergies indicates no known allergies.  Home Medications   Prior to Admission medications   Medication Sig Start Date End Date Taking? Authorizing Provider  allopurinol (ZYLOPRIM) 100 MG tablet Take 100 mg by mouth daily.    Yes Historical Provider, MD  aspirin EC 81 MG tablet Take 81 mg by mouth daily.   Yes Historical Provider, MD  atorvastatin (LIPITOR) 40 MG tablet Take 40 mg by mouth daily.   Yes Historical Provider, MD  carbidopa-levodopa (SINEMET IR) 25-100 MG per tablet Take 0.5 tablets by mouth 3 (three) times daily.   Yes Historical Provider, MD  lisinopril (PRINIVIL,ZESTRIL) 20 MG tablet Take 20 mg by mouth daily.   Yes Historical Provider, MD  meclizine (ANTIVERT) 25 MG tablet Take 12.5 mg by mouth every 8 (eight) hours as needed for dizziness.    Yes Historical  Provider, MD  metoprolol tartrate (LOPRESSOR) 25 MG tablet Take 12.5 mg by mouth 2 (two) times daily.   Yes Historical Provider, MD  Omega-3 Fatty Acids (FISH OIL) 1200 MG CAPS Take 2,400 mg by mouth daily.   Yes Historical Provider, MD  diltiazem (CARDIZEM CD) 120 MG 24 hr capsule Take 1 capsule (120 mg total) by mouth daily. 09/29/14   Rogelia Mire, NP   BP 153/93 mmHg  Pulse 67  Temp(Src) 98.2 F (36.8 C) (Oral)  Resp 20  SpO2 100% Physical Exam  Constitutional: He is oriented to person, place, and time. He appears well-developed and well-nourished. No distress.  Patient is an 81 year old male who appears somewhat restless. He is awake and alert and answers questions appropriately with the exception of disorientation to the  time of day. He believes it is evening when it is actually morning.  HENT:  Head: Normocephalic and atraumatic.  Mouth/Throat: Oropharynx is clear and moist.  Eyes: EOM are normal. Pupils are equal, round, and reactive to light.  Neck: Normal range of motion. Neck supple.  Cardiovascular: Normal rate and regular rhythm.   No murmur heard. Pulmonary/Chest: Effort normal and breath sounds normal. No respiratory distress. He has no wheezes.  Abdominal: Soft. Bowel sounds are normal. He exhibits no distension. There is no tenderness.  Musculoskeletal: Normal range of motion. He exhibits no edema.  Neurological: He is alert and oriented to person, place, and time. No cranial nerve deficit.  Skin: Skin is warm and dry. He is not diaphoretic.  Nursing note and vitals reviewed.   ED Course  Procedures (including critical care time) Labs Review Labs Reviewed  CBC  BASIC METABOLIC PANEL  ETHANOL  I-STAT CHEM 8, ED    Imaging Review No results found.   EKG Interpretation   Date/Time:  Tuesday Apr 18 2015 08:41:00 EDT Ventricular Rate:  64 PR Interval:  197 QRS Duration: 102 QT Interval:  452 QTC Calculation: 466 R Axis:   -52 Text Interpretation:  Sinus or ectopic atrial rhythm LAD, consider left  anterior fascicular block RSR' in V1 or V2, probably normal variant  Baseline wander in lead(s) I III aVR aVL V3 V6 Confirmed by Schyler Butikofer  MD,  Adison Jerger ([id number]) on 04/18/2015 8:43:27 AM      MDM   Final diagnoses:  None    Workup is unremarkable with the exception of a blood alcohol of 290. This appears to be the cause of his symptoms. I had initially planned to discharge him, however the patient insisted that he had no one to pick him up and no ride home. Patient has been observed for many hours and blood alcohol was repeated and is now 148. He is ambulatory with a steady gait and no longer appears intoxicated. I suspect this patient drinks frequently and has a high tolerance for the  effects of alcohol. I believe he is appropriate for discharge at this time.    Veryl Speak, MD 04/18/15 1623  Veryl Speak, MD 04/18/15 [phone]

## 2015-04-18 NOTE — ED Notes (Signed)
Pt got up out of bed could not catch balance at first. Info pt to sit back down until he was ready to stand.Took pt a few second before he started walking around in the room.Pt did just fine doing so.

## 2015-06-12 ENCOUNTER — Encounter (HOSPITAL_COMMUNITY): Payer: Self-pay | Admitting: *Deleted

## 2015-06-12 ENCOUNTER — Emergency Department (HOSPITAL_COMMUNITY)
Admission: EM | Admit: 2015-06-12 | Discharge: 2015-06-12 | Disposition: A | Discharge status: Home / Self Care | Payer: Medicare Other | Attending: Emergency Medicine | Admitting: Emergency Medicine

## 2015-06-12 ENCOUNTER — Emergency Department (HOSPITAL_COMMUNITY): Payer: Medicare Other

## 2015-06-12 DIAGNOSIS — Y9389 Activity, other specified: Secondary | ICD-10-CM | POA: Diagnosis not present

## 2015-06-12 DIAGNOSIS — F10920 Alcohol use, unspecified with intoxication, uncomplicated: Secondary | ICD-10-CM

## 2015-06-12 DIAGNOSIS — I10 Essential (primary) hypertension: Secondary | ICD-10-CM | POA: Diagnosis not present

## 2015-06-12 DIAGNOSIS — Z87891 Personal history of nicotine dependence: Secondary | ICD-10-CM | POA: Insufficient documentation

## 2015-06-12 DIAGNOSIS — G20 Parkinson's disease: Secondary | ICD-10-CM | POA: Insufficient documentation

## 2015-06-12 DIAGNOSIS — Y9289 Other specified places as the place of occurrence of the external cause: Secondary | ICD-10-CM | POA: Insufficient documentation

## 2015-06-12 DIAGNOSIS — E780 Pure hypercholesterolemia: Secondary | ICD-10-CM | POA: Diagnosis not present

## 2015-06-12 DIAGNOSIS — W1839XA Other fall on same level, initial encounter: Secondary | ICD-10-CM | POA: Insufficient documentation

## 2015-06-12 DIAGNOSIS — Z043 Encounter for examination and observation following other accident: Secondary | ICD-10-CM | POA: Insufficient documentation

## 2015-06-12 DIAGNOSIS — J449 Chronic obstructive pulmonary disease, unspecified: Secondary | ICD-10-CM | POA: Diagnosis not present

## 2015-06-12 DIAGNOSIS — F10120 Alcohol abuse with intoxication, uncomplicated: Secondary | ICD-10-CM | POA: Diagnosis not present

## 2015-06-12 DIAGNOSIS — Z79899 Other long term (current) drug therapy: Secondary | ICD-10-CM | POA: Insufficient documentation

## 2015-06-12 DIAGNOSIS — Y998 Other external cause status: Secondary | ICD-10-CM | POA: Diagnosis not present

## 2015-06-12 DIAGNOSIS — Z8546 Personal history of malignant neoplasm of prostate: Secondary | ICD-10-CM | POA: Diagnosis not present

## 2015-06-12 DIAGNOSIS — Z7982 Long term (current) use of aspirin: Secondary | ICD-10-CM | POA: Insufficient documentation

## 2015-06-12 DIAGNOSIS — M109 Gout, unspecified: Secondary | ICD-10-CM | POA: Diagnosis not present

## 2015-06-12 DIAGNOSIS — W19XXXA Unspecified fall, initial encounter: Secondary | ICD-10-CM

## 2015-06-12 LAB — URINALYSIS, ROUTINE W REFLEX MICROSCOPIC
Bilirubin Urine: NEGATIVE
Glucose, UA: NEGATIVE mg/dL
HGB URINE DIPSTICK: NEGATIVE
Ketones, ur: NEGATIVE mg/dL
LEUKOCYTES UA: NEGATIVE
Nitrite: NEGATIVE
Protein, ur: NEGATIVE mg/dL
SPECIFIC GRAVITY, URINE: 1.008 (ref 1.005–1.030)
Urobilinogen, UA: 1 mg/dL (ref 0.0–1.0)
pH: 6 (ref 5.0–8.0)

## 2015-06-12 LAB — CBC
HEMATOCRIT: 38.6 % — AB (ref 39.0–52.0)
Hemoglobin: 13.4 g/dL (ref 13.0–17.0)
MCH: 35.6 pg — ABNORMAL HIGH (ref 26.0–34.0)
MCHC: 34.7 g/dL (ref 30.0–36.0)
MCV: 102.7 fL — ABNORMAL HIGH (ref 78.0–100.0)
PLATELETS: 129 10*3/uL — AB (ref 150–400)
RBC: 3.76 MIL/uL — AB (ref 4.22–5.81)
RDW: 13.4 % (ref 11.5–15.5)
WBC: 4.4 10*3/uL (ref 4.0–10.5)

## 2015-06-12 LAB — COMPREHENSIVE METABOLIC PANEL
ALBUMIN: 4 g/dL (ref 3.5–5.0)
ALT: 56 U/L (ref 17–63)
AST: 101 U/L — ABNORMAL HIGH (ref 15–41)
Alkaline Phosphatase: 55 U/L (ref 38–126)
Anion gap: 16 — ABNORMAL HIGH (ref 5–15)
BUN: 14 mg/dL (ref 6–20)
CALCIUM: 8.6 mg/dL — AB (ref 8.9–10.3)
CO2: 17 mmol/L — AB (ref 22–32)
Chloride: 108 mmol/L (ref 101–111)
Creatinine, Ser: 0.88 mg/dL (ref 0.61–1.24)
GFR calc non Af Amer: >60 mL/min (ref >60)
Glucose, Bld: 99 mg/dL (ref 65–99)
Potassium: 3.6 mmol/L (ref 3.5–5.1)
Sodium: 141 mmol/L (ref 135–145)
Total Bilirubin: 0.8 mg/dL (ref 0.3–1.2)
Total Protein: 7.2 g/dL (ref 6.5–8.1)

## 2015-06-12 LAB — ETHANOL: Alcohol, Ethyl (B): 237 mg/dL — ABNORMAL HIGH (ref <5)

## 2015-06-12 NOTE — ED Notes (Signed)
Bed: WA22 Expected date:  Expected time:  Means of arrival:  Comments: EMS-fall 

## 2015-06-12 NOTE — ED Notes (Addendum)
Patient reports that he fell 2-3 days ago, states was able to get up. Scooting on floor on floor when EMS arrived. No obvious injuries or complaints of pain. Unsteady but ambulatory on scene. States called EMS because needed to get checked out, does not feel "right in the head."   Mildly confused for EMS. When they got him up, wanted to go back to bed - in his garage, unsure why they were there.   Generalized weakness x 6 months. PCP has told him he has Alzheimers.

## 2015-06-12 NOTE — ED Provider Notes (Signed)
CSN: [id number]     Arrival date & time 06/12/15  0920 History   First MD Initiated Contact with Patient 06/12/15 [phone]     Chief Complaint  Patient presents with  . Fall     HPI Patient reports she presents the ER today because he is unhappy with his living situation.  He is without children and his wife died 2 years ago.  He lives at home independently.  He admits to drinking gin daily.  He feels as though his gin intake has increased over the past 4-6 months.  He understands this is a problem.  He reports that he fell 2 days ago.  He denies headache.  He is unsure if he struck his head.  EMS was concerned about the possibility of mild confusion.  Patient reports he still able to feed himself and get himself dressed.  He goes to the grocery store regularly.   Past Medical History  Diagnosis Date  . Hypertension   . Hypercholesteremia   . Gout   . COPD (chronic obstructive pulmonary disease)   . Detached retina     a. s/p surgical correction on the right.  . Prostate cancer     a. s/p TURP.  Marland Kitchen Ectopic atrial tachycardia     a. 08/2014 Echo: EF 55-60%, mildly dil LA.  . Parkinson's disease   . Left inguinal hernia     a. s/p repair ~ 10 yrs ago.   Past Surgical History  Procedure Laterality Date  . Colonoscopy w/ polypectomy  2001, 2006  . Hernia repair    . Prostate surgery     Family History  Problem Relation Age of Onset  . Stroke Father     died @ 71.  Marland Kitchen Heart attack Mother     died @ 68  . Stroke Brother     died in his 39's  . Heart attack Brother     died in his 73's   History  Substance Use Topics  . Smoking status: Former Smoker -- 2.00 packs/day for 22 years    Types: Cigarettes    Quit date: 11/18/1977  . Smokeless tobacco: Never Used  . Alcohol Use: 0.6 oz/week    1 Standard drinks or equivalent per week     Comment: 4 oz of gin daily.    Review of Systems  All other systems reviewed and are negative.     Allergies  Review of patient's  allergies indicates no known allergies.  Home Medications   Prior to Admission medications   Medication Sig Start Date End Date Taking? Authorizing Provider  allopurinol (ZYLOPRIM) 100 MG tablet Take 100 mg by mouth daily.    Yes Historical Provider, MD  aspirin EC 81 MG tablet Take 81 mg by mouth daily.   Yes Historical Provider, MD  atorvastatin (LIPITOR) 40 MG tablet Take 40 mg by mouth daily.   Yes Historical Provider, MD  carbidopa-levodopa (SINEMET IR) 25-100 MG per tablet Take 0.5 tablets by mouth 3 (three) times daily.   Yes Historical Provider, MD  lisinopril (PRINIVIL,ZESTRIL) 20 MG tablet Take 20 mg by mouth daily.   Yes Historical Provider, MD  Omega-3 Fatty Acids (FISH OIL) 1200 MG CAPS Take 2,400 mg by mouth daily.   Yes Historical Provider, MD  diltiazem (CARDIZEM CD) 120 MG 24 hr capsule Take 1 capsule (120 mg total) by mouth daily. Patient not taking: Reported on 06/12/2015 09/29/14   Rogelia Mire, NP   BP 139/92 mmHg  Pulse 78  Temp(Src) 97.6 F (36.4 C) (Oral)  Resp 20  SpO2 100% Physical Exam  Constitutional: He is oriented to person, place, and time. He appears well-developed and well-nourished.  HENT:  Head: Normocephalic and atraumatic.  Eyes: EOM are normal. Pupils are equal, round, and reactive to light.  Neck: Normal range of motion.  Cardiovascular: Normal rate, regular rhythm, normal heart sounds and intact distal pulses.   Pulmonary/Chest: Effort normal and breath sounds normal. No respiratory distress.  Abdominal: Soft. He exhibits no distension. There is no tenderness.  Musculoskeletal: Normal range of motion.  Neurological: He is alert and oriented to person, place, and time.  5/5 strength in major muscle groups of  bilateral upper and lower extremities. Speech normal. No facial asymetry.  Gait normal.  Able to ambulate in the hall  Skin: Skin is warm and dry.  Psychiatric: He has a normal mood and affect. Judgment normal.  Nursing note and  vitals reviewed.   ED Course  Procedures (including critical care time) Labs Review Labs Reviewed  CBC - Abnormal; Notable for the following:    RBC 3.76 (*)    HCT 38.6 (*)    MCV 102.7 (*)    MCH 35.6 (*)    Platelets 129 (*)    All other components within normal limits  COMPREHENSIVE METABOLIC PANEL - Abnormal; Notable for the following:    CO2 17 (*)    Calcium 8.6 (*)    AST 101 (*)    Anion gap 16 (*)    All other components within normal limits  ETHANOL - Abnormal; Notable for the following:    Alcohol, Ethyl (B) 237 (*)    All other components within normal limits  URINALYSIS, ROUTINE W REFLEX MICROSCOPIC (NOT AT [facility]) - Abnormal; Notable for the following:    APPearance CLOUDY (*)    All other components within normal limits    Imaging Review Ct Head Wo Contrast  06/12/2015   CLINICAL DATA:  Fall 2-3 days ago  EXAM: CT HEAD WITHOUT CONTRAST  TECHNIQUE: Contiguous axial images were obtained from the base of the skull through the vertex without intravenous contrast.  COMPARISON:  12/28/2014  FINDINGS: Mild cortical volume loss noted with proportional ventricular prominence. Areas of periventricular white matter hypodensity are most compatible with small vessel ischemic change. No acute hemorrhage, infarct, or mass lesion is identified. Evidence of right scleral banding. Mild ethmoid and left maxillary mucoperiosteal thickening. No skull fracture.  IMPRESSION: No acute intracranial abnormality.   Electronically Signed   By: Conchita Paris M.D.   On: 06/12/2015 10:20  I personally reviewed the imaging tests through PACS system I reviewed available ER/hospitalization records through the EMR    EKG Interpretation None      MDM   Final diagnoses:  Fall, initial encounter  Alcohol intoxication, uncomplicated    Patient has a history of alcohol abuse.  He does admit to this as well.  He feels like his alcohol use is increasing.  At this time the patient does not desire  alcohol detox.  He'll be discharged home at this time.  Medical screening examination completed.    Jola Schmidt, MD 06/12/15 1240

## 2015-06-12 NOTE — Progress Notes (Signed)
CSW consulted for cab voucher. CSW met with pt at bedside. Pt unable to ride bus home. Pt able to clarify address. Cab called, as requested by RN, and will be able to pick up patient within 10 min. [place], [place] Work  [employer] [phone]

## 2015-06-12 NOTE — ED Notes (Signed)
Patient transported to CT 

## 2015-06-12 NOTE — Discharge Instructions (Signed)
°Emergency Department Resource Guide °1) Find a Doctor and Pay Out of Pocket °Although you won't have to find out who is covered by your insurance plan, it is a good idea to ask around and get recommendations. You will then need to call the office and see if the doctor you have chosen will accept you as a new patient and what types of options they offer for patients who are self-pay. Some doctors offer discounts or will set up payment plans for their patients who do not have insurance, but you will need to ask so you aren't surprised when you get to your appointment. ° °2) Contact Your Local Health Department °Not all health departments have doctors that can see patients for sick visits, but many do, so it is worth a call to see if yours does. If you don't know where your local health department is, you can check in your phone book. The CDC also has a tool to help you locate your state's health department, and many state websites also have listings of all of their local health departments. ° °3) Find a Walk-in Clinic °If your illness is not likely to be very severe or complicated, you may want to try a walk in clinic. These are popping up all over the country in pharmacies, drugstores, and shopping centers. They're usually staffed by nurse practitioners or physician assistants that have been trained to treat common illnesses and complaints. They're usually fairly quick and inexpensive. However, if you have serious medical issues or chronic medical problems, these are probably not your best option. ° °No Primary Care Doctor: °- Call [facility] Connect at  832-8000 - they can help you locate a primary care doctor that  accepts your insurance, provides certain services, etc. °- Physician Referral Service- 1-800-533-3463 ° °Chronic Pain Problems: °Organization         Address  Phone   Notes  °[facility]  ([phone]) [phone] Patients need to be referred by their primary care doctor.  ° °Medication  Assistance: °Organization         Address  Phone   Notes  °[place] [facility] [address] °[place], [place] [address] (336) 641-8030 --Must be a resident of [place] °-- Must have NO insurance coverage whatsoever (no Medicaid/ Medicare, etc.) °-- The pt. MUST have a primary care doctor that directs their care regularly and follows them in the community °  °[facility]  (866) 331-1348   °[employer]  (888) 892-1162   ° °Agencies that provide inexpensive medical care: °Organization         Address  Phone   Notes  °[facility]  (336) 832-8035   °[facility]    (336) 832-7272   °[facility] [address] °[place],  [address] (336) 832-4777   °[facility] [address], °[place] (336) 271-4999   °[facility]    (336) 373-0678   °[facility]    (336) 272-1050   °[facility] ° [address], [place] Phone:  (336) 832-4444, Fax:  (336) 832-4440 Hours of Operation:  9 am - 6 pm, M-F.  Also accepts Medicaid/Medicare and self-pay.  °[facility] ° [address], [place] Phone: (336) [phone], Fax: (336) [phone]. Hours of Operation:  8:30 am - 5:30 pm, M-F.  Also accepts Medicaid and self-pay.  °[facility] [place] [address], [place] Phone: (336) 878-6027   °[facility] [address], [place], [place] (336)723-1848, Ext. 123 Mondays & Thursdays: 7-9 AM.  First 15 patients are seen on a first come, first serve basis. °  ° °Medicaid-accepting [place] Providers: ° °Organization         Address  Phone   Notes  °[facility] [address], [place] (336) 641-2100 Also accepts self-pay patients.  °[facility] [address], [place] ° (336) [phone]   °[facility] [address], [place]  (336) 288-8857   °[facility] [address] [place] [address], [place] (336) 299-7000   °Veita Bland [address], [place]  ° (336) 373-1557 Only accepts [place] Access Medicaid patients after they have their name applied to their card.  ° °Self-Pay (no insurance) in [place]: ° °Organization         Address  Phone   Notes  °Sickle Cell Patients, [facility] [address], [place] (336) 832-1970   °[facility] [address], [place] (336) 832-4400   °[facility] ° [address], [place] (336) 992-4800   °[facility]/Dr. Osei-Bonsu ° [address] [place] [address], [place] or [address], [place] (336) [phone] Phone number for both [place] and [place] locations is the same.  °[facility] [address], [place] (336) 299-0000   °[facility] [address] [place] [address], [place] or [address] (336) 852-7530 °(336) 878-2260   °[facility] [address], [place] (336) 350-1642, phone; (336) 294-5005, fax Sees patients 1st and 3rd Saturday of every month.  Must not qualify for public or private insurance (i.e. Medicaid, Medicare, [place] Health Choice, Veterans' Benefits) • Household income should be no more than 200% of the poverty level •The clinic cannot treat you if you are pregnant or think you are pregnant • Sexually transmitted diseases are not treated at the clinic.  ° ° °Dental Care: °Organization         Address  Phone  Notes  °[place] [facility] [address], [place] (336) 641-6152 Accepts children up to age 21 who are enrolled in Medicaid or [place] Health Choice; pregnant women with a Medicaid card; and children who have applied for Medicaid or [place] Health Choice, but were declined, whose parents can pay a reduced fee at time of service.  °[place]  [facility] [place]  [address], [place] (336) 641-7733 Accepts children up to age 21 who are enrolled in Medicaid or [place] Health Choice; pregnant women with a Medicaid card; and children who have applied for Medicaid or [place] Health Choice, but were declined, whose parents can pay a reduced fee at time of service.  °[facility] ° [address],  (336) 641-4533 Patients are seen by appointment only. Walk-ins are not accepted. [facility] will see patients 18 years of age and older. °Monday - Tuesday (8am-5pm) °Most Wednesdays (8:30-5pm) °$30 per visit, cash only  °[facility] ° [address], [place] (336) 641-4533 Patients are seen by appointment only. Walk-ins are not accepted. [facility] will see patients 18 years of age and older. °One   Wednesday Evening (Monthly: Volunteer Based).  $30 per visit, cash only  °[facility]  (919) [phone] for adults; Children under age 4, call Graduate Pediatric Dentistry at ([phone]) [phone]. Children aged 4-14, please call ([phone]) [phone] to request a pediatric application. ° Dental services are provided in all areas of dental care including fillings, crowns and bridges, complete and partial dentures, implants, gum treatment, root canals, and extractions. Preventive care is also provided. Treatment is provided to both adults and children. °Patients are selected via a lottery and there is often a waiting list. °  °[facility] [address], °[place] ° (336) 763-8833 [email or web address] °  °[facility] [address], [place], [place] (336)723-1848, Ext. 123 Second and Fourth Thursday of each month, opens at 6:30 AM; Clinic ends at 9 AM.  Patients are seen on a first-come first-served basis, and a limited number are seen during each clinic.  ° °[facility] ° [address], [place], [place] (336) [phone]    Eligibility Requirements °You must have lived in [place], [place], or [place] counties for at least the last three months. °  You cannot be eligible for state or federal sponsored healthcare insurance, including Veterans Administration, Medicaid, or Medicare. °  You generally cannot be eligible for healthcare insurance through your employer.  °  How to apply: °Eligibility screenings are held every Tuesday and Wednesday afternoon from 1:00 pm until 4:00 pm. You do not need an appointment for the interview!  °[facility] [address], [place], [place] 336-631-2330   °[facility]  336-342-8273   °[place]  336-703-3100   °[facility]  336-570-6415   ° °Behavioral Health Resources in the Community: °Intensive Outpatient Programs °Organization         Address  Phone  Notes  °[facility] [address], [place], [place] 336-878-6098   °[facility] [address], [place], [place] 336-832-9800   °[facility] [address], [place], [place] ° 336-882-2125   °[facility] [address],  °[place], [place] 1-800-853-5163 or 336-641-4981   °Substance Abuse Resources °Organization         Address  Phone  Notes  °[facility]  336-882-2125   °[facility]  336-784-9470   °[facility]  336-285-9073   °[facility]  336-845-3988   °Residential & Outpatient Substance Abuse Program  1-800-659-3381   °Psychological Services °Organization         Address  Phone  Notes  °[facility]  336- 832-9600   °[facility]  336- 378-7881   °[facility] [address], [place] 1-800-853-5163 or 336-641-4981   ° °Mobile Crisis Teams °Organization         Address  Phone  Notes  °[facility], Mobile Crisis Care Unit  1-877-626-1772   °Assertive °[facility] ° [address]  [place], [place] 336-834-9664   °Sharon DeEsch [address] °[place]  336-554-5454   ° °Self-Help/Support Groups °Organization         Address  Phone             Notes  °[facility]  - variety of support groups  336- 373-1402 Call for more information  °Narcotics Anonymous (NA), [facility] [address], °[place]   2 meetings at this location  ° °  Residential Treatment Programs Organization         Address  Phone  Notes  [facility] [address]    [place]  1-563-831-7509   [facility]  [address] [place] [address], [place], [place]   [place], [place] [phone] Admissions: 8am-3pm M-F  [facility] [place] [address],    [place], [place] 465-681-2751   [facility] [address] [place], [place], [place]   [facility] [address],  [place], [place]   [facility] Intensive Outpatient [address], [place], [place]   [facility] [address]  [place], [place] 1-902-501-8301 or (986)722-9139   [facility] [address] [place], [place] Accepts Medicaid  [facility] [address]  [place] 1-440-836-2745 Substance Abuse/Addiction Treatment   [facility] Organization         Address  Phone  Notes  [facility]  608-239-2921   Domenic Schwab, PhD [address] [place], [place]   4697144750 or 4697984786   [facility] [place], [place] 253-074-8470   [facility] [address], [place], [place] [phone] Insurance/Medicaid/sponsorship through [facility] [address] [place]                                    [place], [place] [phone] [place] [address], [place] [phone]    Dr. Adele Schilder  [phone]   [facility] Dept. 1) [address], [place] 2) [place] 3)  [address], [place] 775-740-7394 [phone])[phone]  [phone]   [place] 973-350-7282 or 570-536-7832 (After Hours)      Alcohol Use Disorder Alcohol use disorder is a mental disorder. It is not a one-time incident of heavy drinking. Alcohol use disorder is the excessive and uncontrollable use of alcohol over time that leads to problems with functioning in one or more areas of daily living. People with this disorder risk harming themselves and others when they drink to excess. Alcohol use disorder also can cause other mental disorders, such as mood and anxiety disorders, and serious physical problems. People with alcohol use disorder often misuse other drugs.  Alcohol use disorder is common and widespread. Some people with this disorder drink alcohol to cope with or escape from negative life events. Others drink to relieve chronic pain or symptoms of mental illness. People with a family history of alcohol use disorder are at higher risk of losing control and using alcohol to excess.  SYMPTOMS  Signs and symptoms of alcohol use disorder may include the following:   Consumption ofalcohol inlarger amounts or over a longer period of time than intended.  Multiple unsuccessful attempts to cutdown or control alcohol use.   A great deal of time spent obtaining alcohol, using alcohol, or recovering from the effects of alcohol (hangover).  A strong desire or urge to use alcohol (cravings).   Continued use of alcohol despite problems at work, school, or home because of alcohol use.  Continued use of alcohol despite problems in relationships because of alcohol use.  Continued use of alcohol in situations when it is  physically hazardous, such as driving a car.  Continued use of alcohol despite awareness of a physical or psychological problem that is likely related to alcohol use. Physical problems related to alcohol use can involve the brain, heart, liver, stomach, and intestines. Psychological problems related to alcohol use include intoxication, depression, anxiety, psychosis, delirium, and dementia.   The need for increased amounts of alcohol to achieve the same desired effect, or a decreased effect from the consumption of the same amount of alcohol (tolerance).  Withdrawal symptoms upon reducing or stopping alcohol use, or alcohol use to reduce or avoid withdrawal symptoms. Withdrawal symptoms include:  Racing heart.  Hand tremor.  Difficulty sleeping.  Nausea.  Vomiting.  Hallucinations.  Restlessness.  Seizures. DIAGNOSIS Alcohol use disorder is diagnosed through an assessment by your health care provider. Your health care provider may start by asking three or four questions to screen for excessive or problematic alcohol use. To confirm a diagnosis of alcohol use disorder, at least two symptoms must be present within a 47-month period. The severity of alcohol use disorder depends on the number of symptoms:  Mild--two or three.  Moderate--four or five.  Severe--six or more. Your health care provider may perform a physical exam or use results from lab tests to see if you have physical problems resulting from alcohol use. Your health care provider may refer you to a mental health professional for evaluation. TREATMENT  Some people with alcohol use disorder are able to reduce their alcohol use to low-risk levels. Some people with alcohol use disorder need to quit drinking alcohol. When necessary, mental health professionals with specialized training in substance use treatment can help. Your health care provider can help you decide how severe your alcohol use disorder is and what type of  treatment you need. The following forms of treatment are available:   Detoxification. Detoxification involves the use of prescription medicines to prevent alcohol withdrawal symptoms in the first week after quitting. This is important for people with a history of symptoms of withdrawal and for heavy drinkers who are likely to have withdrawal symptoms. Alcohol withdrawal can be dangerous and, in severe cases, cause death. Detoxification is usually provided in a hospital or in-patient substance use treatment facility.  Counseling or talk therapy. Talk therapy is provided by substance use treatment counselors. It addresses the reasons people use alcohol and ways to keep them from drinking again. The goals of talk therapy are to help people with alcohol use disorder find healthy activities and ways to cope with life stress, to identify and avoid triggers for alcohol use, and to handle cravings, which can cause relapse.  Medicines.Different medicines can help treat alcohol use disorder through the following actions:  Decrease alcohol cravings.  Decrease the positive reward response felt from alcohol use.  Produce an uncomfortable physical reaction when alcohol is used (aversion therapy).  Support groups. Support groups are run by people who have quit drinking. They provide emotional support, advice, and guidance. These forms of treatment are often combined. Some people with alcohol use disorder benefit from intensive combination treatment provided by specialized substance use treatment centers. Both inpatient and outpatient treatment programs are available. Document Released: 12/12/2004 Document Revised: 03/21/2014 Document Reviewed: 02/11/2013 [facility] Patient Information 2015 [place], [place]. This information is not intended to replace advice given to you by your health care provider. Make sure you  discuss any questions you have with your health care provider. ° °

## 2015-07-31 ENCOUNTER — Emergency Department (HOSPITAL_COMMUNITY): Payer: Medicare Other

## 2015-07-31 ENCOUNTER — Inpatient Hospital Stay (HOSPITAL_COMMUNITY)
Admission: EM | Admit: 2015-07-31 | Discharge: 2015-08-08 | DRG: 896 | Disposition: A | Discharge status: Skilled Nursing Facility | Payer: Medicare Other | Attending: Internal Medicine | Admitting: Internal Medicine

## 2015-07-31 ENCOUNTER — Encounter (HOSPITAL_COMMUNITY): Payer: Self-pay | Admitting: Emergency Medicine

## 2015-07-31 DIAGNOSIS — J9691 Respiratory failure, unspecified with hypoxia: Secondary | ICD-10-CM | POA: Diagnosis present

## 2015-07-31 DIAGNOSIS — E8889 Other specified metabolic disorders: Secondary | ICD-10-CM | POA: Diagnosis present

## 2015-07-31 DIAGNOSIS — W19XXXA Unspecified fall, initial encounter: Secondary | ICD-10-CM

## 2015-07-31 DIAGNOSIS — Z8546 Personal history of malignant neoplasm of prostate: Secondary | ICD-10-CM

## 2015-07-31 DIAGNOSIS — Z66 Do not resuscitate: Secondary | ICD-10-CM | POA: Diagnosis present

## 2015-07-31 DIAGNOSIS — Z8249 Family history of ischemic heart disease and other diseases of the circulatory system: Secondary | ICD-10-CM

## 2015-07-31 DIAGNOSIS — I471 Supraventricular tachycardia: Secondary | ICD-10-CM

## 2015-07-31 DIAGNOSIS — I10 Essential (primary) hypertension: Secondary | ICD-10-CM | POA: Diagnosis present

## 2015-07-31 DIAGNOSIS — R296 Repeated falls: Secondary | ICD-10-CM | POA: Diagnosis present

## 2015-07-31 DIAGNOSIS — Z9114 Patient's other noncompliance with medication regimen: Secondary | ICD-10-CM | POA: Diagnosis present

## 2015-07-31 DIAGNOSIS — J9601 Acute respiratory failure with hypoxia: Secondary | ICD-10-CM | POA: Diagnosis not present

## 2015-07-31 DIAGNOSIS — I951 Orthostatic hypotension: Secondary | ICD-10-CM | POA: Diagnosis not present

## 2015-07-31 DIAGNOSIS — M109 Gout, unspecified: Secondary | ICD-10-CM | POA: Diagnosis present

## 2015-07-31 DIAGNOSIS — K921 Melena: Secondary | ICD-10-CM | POA: Diagnosis present

## 2015-07-31 DIAGNOSIS — R531 Weakness: Secondary | ICD-10-CM

## 2015-07-31 DIAGNOSIS — Z79899 Other long term (current) drug therapy: Secondary | ICD-10-CM

## 2015-07-31 DIAGNOSIS — N3000 Acute cystitis without hematuria: Secondary | ICD-10-CM | POA: Diagnosis not present

## 2015-07-31 DIAGNOSIS — G9340 Encephalopathy, unspecified: Secondary | ICD-10-CM | POA: Diagnosis present

## 2015-07-31 DIAGNOSIS — N390 Urinary tract infection, site not specified: Secondary | ICD-10-CM | POA: Diagnosis present

## 2015-07-31 DIAGNOSIS — F10231 Alcohol dependence with withdrawal delirium: Secondary | ICD-10-CM | POA: Diagnosis present

## 2015-07-31 DIAGNOSIS — F0390 Unspecified dementia without behavioral disturbance: Secondary | ICD-10-CM | POA: Diagnosis present

## 2015-07-31 DIAGNOSIS — J449 Chronic obstructive pulmonary disease, unspecified: Secondary | ICD-10-CM | POA: Diagnosis present

## 2015-07-31 DIAGNOSIS — Z823 Family history of stroke: Secondary | ICD-10-CM | POA: Diagnosis not present

## 2015-07-31 DIAGNOSIS — E860 Dehydration: Secondary | ICD-10-CM | POA: Diagnosis present

## 2015-07-31 DIAGNOSIS — E872 Acidosis, unspecified: Secondary | ICD-10-CM | POA: Diagnosis present

## 2015-07-31 DIAGNOSIS — E780 Pure hypercholesterolemia: Secondary | ICD-10-CM | POA: Diagnosis present

## 2015-07-31 DIAGNOSIS — R42 Dizziness and giddiness: Secondary | ICD-10-CM

## 2015-07-31 DIAGNOSIS — Z9079 Acquired absence of other genital organ(s): Secondary | ICD-10-CM | POA: Diagnosis present

## 2015-07-31 DIAGNOSIS — R410 Disorientation, unspecified: Secondary | ICD-10-CM | POA: Diagnosis not present

## 2015-07-31 DIAGNOSIS — G20 Parkinson's disease: Secondary | ICD-10-CM | POA: Diagnosis not present

## 2015-07-31 DIAGNOSIS — E8729 Other acidosis: Secondary | ICD-10-CM | POA: Insufficient documentation

## 2015-07-31 DIAGNOSIS — R509 Fever, unspecified: Secondary | ICD-10-CM

## 2015-07-31 DIAGNOSIS — Z7982 Long term (current) use of aspirin: Secondary | ICD-10-CM | POA: Diagnosis not present

## 2015-07-31 DIAGNOSIS — F10931 Alcohol use, unspecified with withdrawal delirium: Secondary | ICD-10-CM | POA: Diagnosis present

## 2015-07-31 DIAGNOSIS — R0602 Shortness of breath: Secondary | ICD-10-CM

## 2015-07-31 DIAGNOSIS — F10230 Alcohol dependence with withdrawal, uncomplicated: Secondary | ICD-10-CM | POA: Diagnosis not present

## 2015-07-31 DIAGNOSIS — J810 Acute pulmonary edema: Secondary | ICD-10-CM | POA: Diagnosis not present

## 2015-07-31 LAB — BLOOD GAS, VENOUS
Acid-base deficit: 5.7 mmol/L — ABNORMAL HIGH (ref 0.0–2.0)
Bicarbonate: 17.8 mEq/L — ABNORMAL LOW (ref 20.0–24.0)
DRAWN BY: 336101
FIO2: 0.21
O2 Saturation: 33.6 %
PATIENT TEMPERATURE: 97.7
PCO2 VEN: 30 mmHg — AB (ref 45.0–50.0)
PH VEN: 7.387 — AB (ref 7.250–7.300)
TCO2: 16 mmol/L (ref 0–100)
pO2, Ven: 24.2 mmHg — CL (ref 30.0–45.0)

## 2015-07-31 LAB — URINALYSIS, ROUTINE W REFLEX MICROSCOPIC
BILIRUBIN URINE: NEGATIVE
GLUCOSE, UA: NEGATIVE mg/dL
Hgb urine dipstick: NEGATIVE
KETONES UR: 40 mg/dL — AB
LEUKOCYTES UA: NEGATIVE
Nitrite: NEGATIVE
PH: 5.5 (ref 5.0–8.0)
PROTEIN: NEGATIVE mg/dL
Specific Gravity, Urine: 1.017 (ref 1.005–1.030)
Urobilinogen, UA: 1 mg/dL (ref 0.0–1.0)

## 2015-07-31 LAB — COMPREHENSIVE METABOLIC PANEL
ALBUMIN: 4.1 g/dL (ref 3.5–5.0)
ALT: 59 U/L (ref 17–63)
AST: 119 U/L — ABNORMAL HIGH (ref 15–41)
Alkaline Phosphatase: 37 U/L — ABNORMAL LOW (ref 38–126)
Anion gap: 22 — ABNORMAL HIGH (ref 5–15)
BUN: 18 mg/dL (ref 6–20)
CHLORIDE: 103 mmol/L (ref 101–111)
CO2: 17 mmol/L — ABNORMAL LOW (ref 22–32)
Calcium: 9.1 mg/dL (ref 8.9–10.3)
Creatinine, Ser: 1.05 mg/dL (ref 0.61–1.24)
GFR calc non Af Amer: >60 mL/min (ref >60)
GLUCOSE: 89 mg/dL (ref 65–99)
Potassium: 4 mmol/L (ref 3.5–5.1)
SODIUM: 142 mmol/L (ref 135–145)
Total Bilirubin: 1.2 mg/dL (ref 0.3–1.2)
Total Protein: 7.1 g/dL (ref 6.5–8.1)

## 2015-07-31 LAB — TYPE AND SCREEN
ABO/RH(D): O POS
Antibody Screen: NEGATIVE

## 2015-07-31 LAB — CBC WITH DIFFERENTIAL/PLATELET
Basophils Absolute: 0 10*3/uL (ref 0.0–0.1)
Basophils Relative: 0 % (ref 0–1)
Eosinophils Absolute: 0.1 10*3/uL (ref 0.0–0.7)
Eosinophils Relative: 1 % (ref 0–5)
HEMATOCRIT: 40 % (ref 39.0–52.0)
HEMOGLOBIN: 13.9 g/dL (ref 13.0–17.0)
Lymphocytes Relative: 16 % (ref 12–46)
Lymphs Abs: 0.8 10*3/uL (ref 0.7–4.0)
MCH: 35.5 pg — ABNORMAL HIGH (ref 26.0–34.0)
MCHC: 34.8 g/dL (ref 30.0–36.0)
MCV: 102.3 fL — ABNORMAL HIGH (ref 78.0–100.0)
MONOS PCT: 9 % (ref 3–12)
Monocytes Absolute: 0.5 10*3/uL (ref 0.1–1.0)
NEUTROS ABS: 3.9 10*3/uL (ref 1.7–7.7)
NEUTROS PCT: 74 % (ref 43–77)
Platelets: 123 10*3/uL — ABNORMAL LOW (ref 150–400)
RBC: 3.91 MIL/uL — ABNORMAL LOW (ref 4.22–5.81)
RDW: 13.5 % (ref 11.5–15.5)
WBC: 5.3 10*3/uL (ref 4.0–10.5)

## 2015-07-31 LAB — ABO/RH: ABO/RH(D): O POS

## 2015-07-31 LAB — TSH: TSH: 1.61 u[IU]/mL (ref 0.350–4.500)

## 2015-07-31 LAB — LACTIC ACID, PLASMA
LACTIC ACID, VENOUS: 7.1 mmol/L — AB (ref 0.5–2.0)
LACTIC ACID, VENOUS: 1.9 mmol/L (ref 0.5–2.0)
Lactic Acid, Venous: 3.1 mmol/L (ref 0.5–2.0)

## 2015-07-31 LAB — POC OCCULT BLOOD, ED: Fecal Occult Bld: NEGATIVE

## 2015-07-31 LAB — ETHANOL: Alcohol, Ethyl (B): 280 mg/dL — ABNORMAL HIGH (ref <5)

## 2015-07-31 LAB — CK: CK TOTAL: 418 U/L — AB (ref 49–397)

## 2015-07-31 MED ORDER — THIAMINE HCL 100 MG/ML IJ SOLN
100.0000 mg | Freq: Every day | INTRAMUSCULAR | Status: DC
Start: 2015-07-31 — End: 2015-07-31
  Administered 2015-07-31: 100 mg via INTRAVENOUS
  Filled 2015-07-31: qty 2

## 2015-07-31 MED ORDER — THIAMINE HCL 100 MG/ML IJ SOLN
100.0000 mg | Freq: Every day | INTRAMUSCULAR | Status: DC
  Administered 2015-08-02: 100 mg via INTRAVENOUS
  Filled 2015-07-31: qty 2

## 2015-07-31 MED ORDER — SODIUM CHLORIDE 0.9 % IJ SOLN
3.0000 mL | Freq: Two times a day (BID) | INTRAMUSCULAR | Status: DC
  Administered 2015-08-01 – 2015-08-06 (×4): 3 mL via INTRAVENOUS

## 2015-07-31 MED ORDER — LORAZEPAM 1 MG PO TABS
1.0000 mg | ORAL_TABLET | Freq: Four times a day (QID) | ORAL | Status: AC | PRN
  Administered 2015-08-01: 1 mg via ORAL
  Filled 2015-07-31: qty 1

## 2015-07-31 MED ORDER — ASPIRIN EC 81 MG PO TBEC
81.0000 mg | DELAYED_RELEASE_TABLET | Freq: Every day | ORAL | Status: DC
  Administered 2015-07-31 – 2015-08-08 (×7): 81 mg via ORAL
  Filled 2015-07-31 (×9): qty 1

## 2015-07-31 MED ORDER — FOLIC ACID 1 MG PO TABS
1.0000 mg | ORAL_TABLET | Freq: Every day | ORAL | Status: DC
  Administered 2015-07-31 – 2015-08-08 (×7): 1 mg via ORAL
  Filled 2015-07-31 (×9): qty 1

## 2015-07-31 MED ORDER — ACETAMINOPHEN 325 MG PO TABS
650.0000 mg | ORAL_TABLET | Freq: Four times a day (QID) | ORAL | Status: DC | PRN
  Administered 2015-08-05 – 2015-08-06 (×2): 650 mg via ORAL
  Filled 2015-07-31 (×2): qty 2

## 2015-07-31 MED ORDER — ACETONE (URINE) TEST VI STRP
1.0000 | ORAL_STRIP | Status: DC | PRN
  Filled 2015-07-31: qty 1

## 2015-07-31 MED ORDER — CHLORHEXIDINE GLUCONATE 0.12 % MT SOLN
15.0000 mL | Freq: Two times a day (BID) | OROMUCOSAL | Status: DC
  Administered 2015-07-31 – 2015-08-08 (×10): 15 mL via OROMUCOSAL
  Filled 2015-07-31 (×14): qty 15

## 2015-07-31 MED ORDER — CETYLPYRIDINIUM CHLORIDE 0.05 % MT LIQD
7.0000 mL | Freq: Two times a day (BID) | OROMUCOSAL | Status: DC
  Administered 2015-07-31 – 2015-08-07 (×9): 7 mL via OROMUCOSAL

## 2015-07-31 MED ORDER — ATORVASTATIN CALCIUM 40 MG PO TABS
40.0000 mg | ORAL_TABLET | Freq: Every day | ORAL | Status: DC
  Administered 2015-07-31 – 2015-08-08 (×7): 40 mg via ORAL
  Filled 2015-07-31 (×9): qty 1

## 2015-07-31 MED ORDER — HEPARIN SODIUM (PORCINE) 5000 UNIT/ML IJ SOLN
5000.0000 [IU] | Freq: Three times a day (TID) | INTRAMUSCULAR | Status: DC
Start: 2015-07-31 — End: 2015-08-08
  Administered 2015-07-31 – 2015-08-08 (×24): 5000 [IU] via SUBCUTANEOUS
  Filled 2015-07-31 (×26): qty 1

## 2015-07-31 MED ORDER — SODIUM CHLORIDE 0.9 % IV SOLN
INTRAVENOUS | Status: DC
  Administered 2015-07-31 – 2015-08-02 (×3): via INTRAVENOUS

## 2015-07-31 MED ORDER — ONDANSETRON HCL 4 MG PO TABS: 4.0000 mg | ORAL_TABLET | Freq: Four times a day (QID) | ORAL | Status: DC | PRN

## 2015-07-31 MED ORDER — ALUM & MAG HYDROXIDE-SIMETH 200-200-20 MG/5ML PO SUSP: 30.0000 mL | Freq: Four times a day (QID) | ORAL | Status: DC | PRN

## 2015-07-31 MED ORDER — ADULT MULTIVITAMIN W/MINERALS CH
1.0000 | ORAL_TABLET | Freq: Every day | ORAL | Status: DC
  Administered 2015-07-31 – 2015-08-08 (×6): 1 via ORAL
  Filled 2015-07-31 (×9): qty 1

## 2015-07-31 MED ORDER — LORAZEPAM 2 MG/ML IJ SOLN
1.0000 mg | Freq: Four times a day (QID) | INTRAMUSCULAR | Status: AC | PRN
  Administered 2015-08-02 – 2015-08-03 (×4): 1 mg via INTRAVENOUS
  Filled 2015-07-31 (×4): qty 1

## 2015-07-31 MED ORDER — DEXTROSE-NACL 5-0.45 % IV SOLN
INTRAVENOUS | Status: AC
  Administered 2015-07-31: 17:00:00 via INTRAVENOUS

## 2015-07-31 MED ORDER — SODIUM CHLORIDE 0.9 % IV SOLN: 250.0000 mL | INTRAVENOUS | Status: DC | PRN

## 2015-07-31 MED ORDER — ACETAMINOPHEN 650 MG RE SUPP: 650.0000 mg | Freq: Four times a day (QID) | RECTAL | Status: DC | PRN

## 2015-07-31 MED ORDER — CARBIDOPA-LEVODOPA 25-100 MG PO TABS
0.5000 | ORAL_TABLET | Freq: Three times a day (TID) | ORAL | Status: DC
  Administered 2015-07-31 – 2015-08-08 (×19): 0.5 via ORAL
  Filled 2015-07-31 (×15): qty 1
  Filled 2015-07-31: qty 0.5
  Filled 2015-07-31 (×3): qty 1
  Filled 2015-07-31 (×2): qty 0.5
  Filled 2015-07-31 (×2): qty 1

## 2015-07-31 MED ORDER — SODIUM CHLORIDE 0.9 % IJ SOLN: 3.0000 mL | INTRAMUSCULAR | Status: DC | PRN

## 2015-07-31 MED ORDER — ALLOPURINOL 100 MG PO TABS
100.0000 mg | ORAL_TABLET | Freq: Every day | ORAL | Status: DC
  Administered 2015-07-31 – 2015-08-08 (×7): 100 mg via ORAL
  Filled 2015-07-31 (×9): qty 1

## 2015-07-31 MED ORDER — VITAMIN B-1 100 MG PO TABS
100.0000 mg | ORAL_TABLET | Freq: Every day | ORAL | Status: DC
  Administered 2015-07-31 – 2015-08-08 (×7): 100 mg via ORAL
  Filled 2015-07-31 (×9): qty 1

## 2015-07-31 MED ORDER — DILTIAZEM HCL ER COATED BEADS 120 MG PO CP24
120.0000 mg | ORAL_CAPSULE | Freq: Every day | ORAL | Status: DC
  Administered 2015-07-31 – 2015-08-08 (×7): 120 mg via ORAL
  Filled 2015-07-31 (×9): qty 1

## 2015-07-31 MED ORDER — LISINOPRIL 20 MG PO TABS
20.0000 mg | ORAL_TABLET | Freq: Every day | ORAL | Status: DC
  Administered 2015-07-31 – 2015-08-08 (×7): 20 mg via ORAL
  Filled 2015-07-31 (×9): qty 1

## 2015-07-31 MED ORDER — SODIUM CHLORIDE 0.9 % IV BOLUS (SEPSIS)
1000.0000 mL | Freq: Once | INTRAVENOUS | Status: AC
  Administered 2015-07-31: 1000 mL via INTRAVENOUS

## 2015-07-31 MED ORDER — ONDANSETRON HCL 4 MG/2ML IJ SOLN: 4.0000 mg | Freq: Four times a day (QID) | INTRAMUSCULAR | Status: DC | PRN

## 2015-07-31 NOTE — ED Notes (Signed)
Bed: WA17 Expected date:  Expected time:  Means of arrival:  Comments: Fall, skin tears, rectal bleed

## 2015-07-31 NOTE — ED Provider Notes (Signed)
CSN: [id number]     Arrival date & time 07/31/15  0429 History   First MD Initiated Contact with Patient 07/31/15 0447     Chief Complaint  Patient presents with  . Fall     (Consider location/radiation/quality/duration/timing/severity/associated sxs/prior Treatment) HPI Comments: PT comes in with cc of fall. He has hx of htn. He is not a good historian. He is not on anticoagulants. Per EMS, pt called in for bleeding and weakness. When they arrived, there was blood all over the hallway. Pt was seen bleeding from both of his arms. Pt had reported to them that he felt weak.  Pt reports here that he had a fall 1 day ago and 2 weeks ago. He has been getting a bit more unsteady. He uses a walker. He is very independent, but getting less "effective." Pt denies nausea, emesis, fevers, chills, chest pains, shortness of breath, headaches, abdominal pain, uti like symptoms. Pt also reports that he has been noticing blood when he wipes his rectum.  ROS 10 Systems reviewed and are negative for acute change except as noted in the HPI.     The history is provided by the patient.    Past Medical History  Diagnosis Date  . Hypertension   . Hypercholesteremia   . Gout   . COPD (chronic obstructive pulmonary disease)   . Detached retina     a. s/p surgical correction on the right.  . Prostate cancer     a. s/p TURP.  Marland Kitchen Ectopic atrial tachycardia     a. 08/2014 Echo: EF 55-60%, mildly dil LA.  . Parkinson's disease   . Left inguinal hernia     a. s/p repair ~ 10 yrs ago.   Past Surgical History  Procedure Laterality Date  . Colonoscopy w/ polypectomy  2001, 2006  . Hernia repair    . Prostate surgery     Family History  Problem Relation Age of Onset  . Stroke Father     died @ 62.  Marland Kitchen Heart attack Mother     died @ 94  . Stroke Brother     died in his 54's  . Heart attack Brother     died in his 58's   Social History  Substance Use Topics  . Smoking status: Former Smoker --  2.00 packs/day for 22 years    Types: Cigarettes    Quit date: 11/18/1977  . Smokeless tobacco: Never Used  . Alcohol Use: 0.6 oz/week    1 Standard drinks or equivalent per week     Comment: 4 oz of gin daily.    Review of Systems    Allergies  Review of patient's allergies indicates no known allergies.  Home Medications   Prior to Admission medications   Medication Sig Start Date End Date Taking? Authorizing Provider  allopurinol (ZYLOPRIM) 100 MG tablet Take 100 mg by mouth daily.     Historical Provider, MD  aspirin EC 81 MG tablet Take 81 mg by mouth daily.    Historical Provider, MD  atorvastatin (LIPITOR) 40 MG tablet Take 40 mg by mouth daily.    Historical Provider, MD  carbidopa-levodopa (SINEMET IR) 25-100 MG per tablet Take 0.5 tablets by mouth 3 (three) times daily.    Historical Provider, MD  diltiazem (CARDIZEM CD) 120 MG 24 hr capsule Take 1 capsule (120 mg total) by mouth daily. Patient not taking: Reported on 06/12/2015 09/29/14   Rogelia Mire, NP  lisinopril (PRINIVIL,ZESTRIL) 20 MG tablet  Take 20 mg by mouth daily.    Historical Provider, MD  Omega-3 Fatty Acids (FISH OIL) 1200 MG CAPS Take 2,400 mg by mouth daily.    Historical Provider, MD   BP 160/74 mmHg  Pulse 78  Temp(Src) 97.7 F (36.5 C) (Oral)  Resp 15  SpO2 94% Physical Exam  Constitutional: He is oriented to person, place, and time. He appears well-developed.  HENT:  Head: Atraumatic.  Neck: Neck supple.  Cardiovascular: Normal rate.   Pulmonary/Chest: Effort normal.  Abdominal: There is tenderness. There is no rebound and no guarding.  Lower quadrant tenderness  Musculoskeletal:  Diffuse bruising over the extremities. PT has skin tear bilateral upper extremities.   Neurological: He is alert and oriented to person, place, and time. No cranial nerve deficit. Coordination normal.  Skin: Skin is warm.  Nursing note and vitals reviewed.   ED Course  Procedures (including critical  care time) Labs Review Labs Reviewed  COMPREHENSIVE METABOLIC PANEL - Abnormal; Notable for the following:    CO2 17 (*)    AST 119 (*)    Alkaline Phosphatase 37 (*)    Anion gap 22 (*)    All other components within normal limits  CBC WITH DIFFERENTIAL/PLATELET - Abnormal; Notable for the following:    RBC 3.91 (*)    MCV 102.3 (*)    MCH 35.5 (*)    Platelets 123 (*)    All other components within normal limits  BLOOD GAS, VENOUS - Abnormal; Notable for the following:    pH, Ven 7.387 (*)    pCO2, Ven 30.0 (*)    pO2, Ven 24.2 (*)    Bicarbonate 17.8 (*)    Acid-base deficit 5.7 (*)    All other components within normal limits  URINALYSIS, ROUTINE W REFLEX MICROSCOPIC (NOT AT [facility])  OCCULT BLOOD X 1 CARD TO LAB, STOOL  ETHANOL  POC OCCULT BLOOD, ED  TYPE AND SCREEN  ABO/RH    Imaging Review Ct Head Wo Contrast  07/31/2015   CLINICAL DATA:  Dizziness.  Fall.  EXAM: CT HEAD WITHOUT CONTRAST  TECHNIQUE: Contiguous axial images were obtained from the base of the skull through the vertex without intravenous contrast.  COMPARISON:  CT 06/12/2015  FINDINGS: Stable generous cerebral atrophy.No intracranial hemorrhage, mass effect, or midline shift. No hydrocephalus. The basilar cisterns are patent. No evidence of territorial infarct. No intracranial fluid collection. Calvarium is intact. Included paranasal sinuses and mastoid air cells are well aerated.  IMPRESSION: No acute intracranial abnormality.   Electronically Signed   By: Jeb Levering M.D.   On: 07/31/2015 06:46   I have personally reviewed and evaluated these images and lab results as part of my medical decision-making.   EKG Interpretation None      MDM   Final diagnoses:  Fall, initial encounter  Weakness generalized  High anion gap metabolic acidosis    Pt comes in with cc of weakness. Per EMS, pt called in for fall, weakness and bloody stools. Pt is noted to have guaic neg stools, and hb is normal, but  he has diffuse bruising and is unsteady with gait. Pt's notes reviewed - has seen neurology and has seen ER in the recent past, and Case management has offered home help that he had declined. He agrees this time around, that he is not as effective as he used to be - and he is not very steady with gait.  Pt also c/o bloody stools, mainly when he wipes  it. LLQ abd tenderness is appreciated. The stools are GUAIAC neg.  Pt also has hx of alcohol use.  I think pt is going to need admission for social issues. He has multiple bruises, unsteady with gait and i think an obs admission with pt/ot will be appropriate.   6:55 AM Dr. Blaine Hamper thinks inpatient will be better - so we will place inpatient bed request. CT head is neg. PT has AG of 22. VBG ordered, and is not showing any acidosis. With his hx of alcohol use, possibly there is starvation ketosis.    Varney Biles, MD 07/31/15 [phone]

## 2015-07-31 NOTE — ED Notes (Signed)
Spoke with Janett Billow, Agricultural consultant. Patient can be transported at 1540.

## 2015-07-31 NOTE — H&P (Addendum)
[facility] History and Physical  Victor Clarke YIF:[id number] DOB: 1929/05/02 DOA: 07/31/2015   PCP: Milagros Evener, MD    Chief Complaint: dizzy  HPI: Victor Clarke is a 81 y.o. male 81 year old male with a history of hypertension, ectopic atrial tachycardia, Parkinson's, COPD and gout was alone at home. The patient states that he came to the hospital because of dizziness that's been going on for a few days. He is unable to explain to me in detail what sort of dizziness he is having. When I ask him if it is worse between laying down and sitting up, he agrees that this is mainly when he has the dizziness. I have asked him if it gets better or worse with ambulation and he states it actually gets better. He has no complaints of palpitations shortness of breath chest pain or nausea. According to ER documentation he is brought in because of a fall and weakness. Apparently when EMS arrived at his residence, there was blood all over the hallway and he was bleeding from both arms. He told the EMS that he was weak. He also tells me that he stopped taking his medications a couple of weeks ago   General: The patient denies anorexia, fever, weight loss Cardiac: Denies chest pain, syncope, palpitations, pedal edema  Respiratory: Denies cough, shortness of breath, wheezing GI: Denies severe indigestion/heartburn, abdominal pain, nausea, vomiting, diarrhea and constipation GU: Denies hematuria, incontinence, dysuria  Musculoskeletal: Denies arthritis  Skin: Denies suspicious skin lesions Neurologic: Denies focal weakness or numbness, change in vision Psychiatry: Denies depression or anxiety. Hematologic: + easy bruising/ bleeding  All other systems reviewed and found to be negative.  Past Medical History  Diagnosis Date  . Hypertension   . Hypercholesteremia   . Gout   . COPD (chronic obstructive pulmonary disease)   . Detached retina     a. s/p surgical correction on the right.  .  Prostate cancer     a. s/p TURP.  Marland Kitchen Ectopic atrial tachycardia     a. 08/2014 Echo: EF 55-60%, mildly dil LA.  . Parkinson's disease   . Left inguinal hernia     a. s/p repair ~ 10 yrs ago.    Past Surgical History  Procedure Laterality Date  . Colonoscopy w/ polypectomy  2001, 2006  . Hernia repair    . Prostate surgery      Social History: does not smoke, drinks 2 shots of bourbon a day Lives at home alone - does not need an assistive device   No Known Allergies  Family history:   Family History  Problem Relation Age of Onset  . Stroke Father     died @ 4.  Marland Kitchen Heart attack Mother     died @ 58  . Stroke Brother     died in his 3's  . Heart attack Brother     died in his 69's      Prior to Admission medications   Medication Sig Start Date End Date Taking? Authorizing Provider  allopurinol (ZYLOPRIM) 100 MG tablet Take 100 mg by mouth daily.     Historical Provider, MD  aspirin EC 81 MG tablet Take 81 mg by mouth daily.    Historical Provider, MD  atorvastatin (LIPITOR) 40 MG tablet Take 40 mg by mouth daily.    Historical Provider, MD  carbidopa-levodopa (SINEMET IR) 25-100 MG per tablet Take 0.5 tablets by mouth 3 (three) times daily.    Historical Provider, MD  diltiazem (CARDIZEM  CD) 120 MG 24 hr capsule Take 1 capsule (120 mg total) by mouth daily. Patient not taking: Reported on 06/12/2015 09/29/14   Rogelia Mire, NP  lisinopril (PRINIVIL,ZESTRIL) 20 MG tablet Take 20 mg by mouth daily.    Historical Provider, MD  Omega-3 Fatty Acids (FISH OIL) 1200 MG CAPS Take 2,400 mg by mouth daily.    Historical Provider, MD     Physical Exam: Filed Vitals:   07/31/15 0438 07/31/15 0630 07/31/15 0915 07/31/15 1130  BP: 125/81 160/74 179/72 146/84  Pulse: 70 78 82 71  Temp: 97.7 F (36.5 C)     TempSrc: Oral     Resp: _0 SpO2: 100% 94% 99% 95%     General: elderly male laying in bed in no acute distress HEENT: Normocephalic and Atraumatic,  Mucous membranes pink                PERRLA; EOM intact; No scleral icterus,                 Nares: Patent, Oropharynx: Clear, Fair Dentition                 Neck: FROM, no cervical lymphadenopathy, thyromegaly, carotid bruit or JVD;  Breasts: deferred CHEST WALL: No tenderness  CHEST: Normal respiration, clear to auscultation bilaterally  HEART: Regular rate and rhythm; no murmurs rubs or gallops  BACK: No kyphosis or scoliosis; no CVA tenderness  GI: Positive Bowel Sounds, soft, non-tender; no masses, no organomegaly Rectal Exam: deferred MSK: No cyanosis, clubbing, or edema Genitalia: not examined  SKIN:  no rash - - skin tears on arms with bleeding CNS: Alert and Oriented x 3, Nonfocal exam, CN 2-12 intact  Labs on Admission:  Basic Metabolic Panel:  Recent Labs Lab 07/31/15 0526  NA 142  K 4.0  CL 103  CO2 17*  GLUCOSE 89  BUN 18  CREATININE 1.05  CALCIUM 9.1   Liver Function Tests:  Recent Labs Lab 07/31/15 0526  AST 119*  ALT 59  ALKPHOS 37*  BILITOT 1.2  PROT 7.1  ALBUMIN 4.1   No results for input(s): LIPASE, AMYLASE in the last 168 hours. No results for input(s): AMMONIA in the last 168 hours. CBC:  Recent Labs Lab 07/31/15 0526  WBC 5.3  NEUTROABS 3.9  HGB 13.9  HCT 40.0  MCV 102.3*  PLT 123*   Cardiac Enzymes: No results for input(s): CKTOTAL, CKMB, CKMBINDEX, TROPONINI in the last 168 hours.  BNP (last 3 results) No results for input(s): BNP in the last 8760 hours.  ProBNP (last 3 results)  Recent Labs  09/16/14 1431  PROBNP 2391.0*    CBG: No results for input(s): GLUCAP in the last 168 hours.  Radiological Exams on Admission: Ct Head Wo Contrast  07/31/2015   CLINICAL DATA:  Dizziness.  Fall.  EXAM: CT HEAD WITHOUT CONTRAST  TECHNIQUE: Contiguous axial images were obtained from the base of the skull through the vertex without intravenous contrast.  COMPARISON:  CT 06/12/2015  FINDINGS: Stable generous cerebral atrophy.No  intracranial hemorrhage, mass effect, or midline shift. No hydrocephalus. The basilar cisterns are patent. No evidence of territorial infarct. No intracranial fluid collection. Calvarium is intact. Included paranasal sinuses and mastoid air cells are well aerated.  IMPRESSION: No acute intracranial abnormality.   Electronically Signed   By: Jeb Levering M.D.   On: 07/31/2015 06:46    EKG: Independently reviewed. ordered  Assessment/Plan Principal Problem:   High  anion gap metabolic acidosis - ordered a Lactic level which is found to be high - also has orthostatic hypotension and therefore this may be due to volume depletion versus rhabdomyolysis from multiple falls- check a CK - Hydrate and follow b met  Active Problems: Dizziness -Head CT normal -Possibly secondary to orthostatic hypotension-he also has a history of ectopic atrial tachycardia and states he has not been taking Cardizem therefore we'll place him on telemetry to monitor for arrhythmias -PT eval  Elevated alcohol level - Tells me he drinks 2 shots of bourbon a day-monitor for withdrawal symptoms CIWA  Orthostatic hypotension -Continue IV fluids--follow I and O and orthostatic vitals -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  -Oriented to time place and person but it doesn't seem that he is very clear as to why he is in the hospital- follow mental status-he may not be safe to return home alone   Medication noncompliance -Discussed with patient the need to take medications regularly-we'll need to reiterate prior to discharge  Consulted:   Code Status: DNR Family Communication: Doristine Counter  DVT  Prophylaxis:Lovenox  Time spent: 76  Hutchins, MD [facility]  If 7PM-7AM, please contact night-coverage [email or web address] 07/31/2015, 1:16 PM

## 2015-07-31 NOTE — ED Notes (Signed)
Writer cleaned the left arm laceration on pt's elbow w/ saline, dry gauze, and tegaderm

## 2015-07-31 NOTE — ED Notes (Signed)
Pt transported by EMS from home, pt does live alone. Pt reports rectal bleed x 2 days. Pt states she does not remember how he fall, EMS reports blood from hallway to outdoor room. A & O.

## 2015-08-01 LAB — CBC
HCT: 32.8 % — ABNORMAL LOW (ref 39.0–52.0)
HEMOGLOBIN: 11.6 g/dL — AB (ref 13.0–17.0)
MCH: 36 pg — AB (ref 26.0–34.0)
MCHC: 35.4 g/dL (ref 30.0–36.0)
MCV: 101.9 fL — ABNORMAL HIGH (ref 78.0–100.0)
Platelets: 105 10*3/uL — ABNORMAL LOW (ref 150–400)
RBC: 3.22 MIL/uL — ABNORMAL LOW (ref 4.22–5.81)
RDW: 13.2 % (ref 11.5–15.5)
WBC: 3.4 10*3/uL — ABNORMAL LOW (ref 4.0–10.5)

## 2015-08-01 LAB — BASIC METABOLIC PANEL
Anion gap: 9 (ref 5–15)
BUN: 21 mg/dL — AB (ref 6–20)
CALCIUM: 7.9 mg/dL — AB (ref 8.9–10.3)
CO2: 24 mmol/L (ref 22–32)
CREATININE: 0.9 mg/dL (ref 0.61–1.24)
Chloride: 104 mmol/L (ref 101–111)
GFR calc Af Amer: >60 mL/min (ref >60)
GFR calc non Af Amer: >60 mL/min (ref >60)
GLUCOSE: 110 mg/dL — AB (ref 65–99)
Potassium: 3.7 mmol/L (ref 3.5–5.1)
Sodium: 137 mmol/L (ref 135–145)

## 2015-08-01 NOTE — Evaluation (Signed)
Physical Therapy Evaluation Patient Details Name: Victor Clarke MRN: [id number] DOB: Nov 07, 1929 Today's Date: 08/01/2015   History of Present Illness  81 year old male with a history of hypertension, ectopic atrial tachycardia, Parkinson's, COPD and gout from home alone. Pt reported dizziness and weakness upon admission notes however unable to recall reason for admission when asked during PT evaluation.  pt admitted for high anion gap metabolic acidosis and dizziness possibly due to orthostatic hypotension.  Clinical Impression  Pt admitted with above diagnosis. Pt currently with functional limitations due to the deficits listed below (see PT Problem List).  Pt will benefit from skilled PT to increase their independence and safety with mobility to allow discharge to the venue listed below.  Pt presents as high fall risk and from home alone.  Pt also presents with cognitive deficits as well.  If unable to have 24/7 assist at home, recommend SNF.     Follow Up Recommendations Supervision/Assistance - 24 hour;SNF    Equipment Recommendations  None recommended by PT    Recommendations for Other Services       Precautions / Restrictions Precautions Precautions: Fall      Mobility  Bed Mobility Overal bed mobility: Needs Assistance Bed Mobility: Supine to Sit;Sit to Supine     Supine to sit: Supervision Sit to supine: Supervision   General bed mobility comments: cues for initiation  Transfers Overall transfer level: Needs assistance Equipment used: Rolling walker (2 wheeled) Transfers: Sit to/from Stand Sit to Stand: Min assist         General transfer comment: verbal cues for hand placement, assist to rise and steady  Ambulation/Gait Ambulation/Gait assistance: Min assist;Min guard Ambulation Distance (Feet): 160 Feet Assistive device: Rolling walker (2 wheeled) Gait Pattern/deviations: Step-through pattern;Wide base of support     General Gait Details: unsteady gait  at times requiring assist, encouraged pt to use RW for safety  Stairs            Wheelchair Mobility    Modified Rankin (Stroke Patients Only)       Balance Overall balance assessment: History of Falls;Needs assistance         Standing balance support: Bilateral upper extremity supported Standing balance-Leahy Scale: Poor                               Pertinent Vitals/Pain Pain Assessment: No/denies pain    Home Living Family/patient expects to be discharged to:: Private residence Living Arrangements: Alone             Home Equipment: Gilford Rile - 2 wheels;Cane - single point      Prior Function Level of Independence: Independent               Hand Dominance        Extremity/Trunk Assessment               Lower Extremity Assessment: Generalized weakness         Communication   Communication: No difficulties  Cognition Arousal/Alertness: Awake/alert Behavior During Therapy: WFL for tasks assessed/performed Overall Cognitive Status: No family/caregiver present to determine baseline cognitive functioning Area of Impairment: Memory     Memory: Decreased short-term memory         General Comments: pt unable to recall reason for admission, seems to be confabulating answers to questions, at times not making sense in regards to answering appropriately    General Comments  Exercises        Assessment/Plan    PT Assessment Patient needs continued PT services  PT Diagnosis Difficulty walking   PT Problem List Decreased strength;Decreased activity tolerance;Decreased balance;Decreased mobility;Decreased safety awareness;Decreased knowledge of use of DME;Decreased cognition  PT Treatment Interventions DME instruction;Gait training;Patient/family education;Functional mobility training;Therapeutic activities;Therapeutic exercise   PT Goals (Current goals can be found in the Care Plan section) Acute Rehab PT Goals PT Goal  Formulation: With patient Time For Goal Achievement: 08/08/15 Potential to Achieve Goals: Good    Frequency Min 3X/week   Barriers to discharge        Co-evaluation               End of Session Equipment Utilized During Treatment: Gait belt Activity Tolerance: Patient tolerated treatment well Patient left: in bed;with call bell/phone within reach;with bed alarm set           Time: 7218-2883 PT Time Calculation (min) (ACUTE ONLY): 15 min   Charges:   PT Evaluation $Initial PT Evaluation Tier I: 1 Procedure     PT G Codes:        Victor Clarke,Victor Clarke 08/01/2015, 1:27 PM Victor Clarke, PT, DPT 08/01/2015 Pager: [phone]

## 2015-08-01 NOTE — Progress Notes (Addendum)
[facility] Progress Note   Victor Clarke  [id number]  DOB: 1929-09-12  DOA: 07/31/2015 PCP: Milagros Evener, MD  Brief narrative: Victor Clarke is a 81 y.o. male with a history of hypertension, ectopic atrial tachycardia, Parkinson's, COPD and gout was alone at home. The patient states that he came to the hospital because of dizziness that's been going on for a few days. According to ER notes he was brought in because of fall and weakness. When the EMS arrived at his residence there was blood throughout the hallway and he was bleeding from both his arms from skin tears. He mentioned to me that he had stopped taking his medications a couple of weeks ago.   Subjective: Dizziness slightly better than yesterday. No new complaints of shortness of breath or cough.  Assessment/Plan: Principal Problem:  High anion gap metabolic acidosis- Orthostatic hypotension - ordered a Lactic level which is found to be high - also had orthostatic hypotension and therefore suspected it to be due to volume depletion versus rhabdomyolysis from multiple falls- checked a CK was found to be 418 -Lactic acidosis has now resolved with hydration - No longer orthostatic-cut back on fluids to 50 mL an hour and monitor by mouth intake and renal function  Active Problems: Dizziness -Head CT normal -Possibly secondary to orthostatic hypotension-he also has a history of ectopic atrial tachycardia and states he has not been taking Cardizem therefore we are following him on telemetry to monitor for arrhythmias -PT eval- SNF versus 24-hour supervision  Elevated alcohol level - Tells me he drinks 2 shots of bourbon a day-follow on CIWA- score is 5 earlier today   Ectopic atrial tachycardia -Resumed Cardizem-- follow on telemetry   Parkinson disease -Resume carbidopa/levodopa   Hypertension Resumed Cardizem and lisinopril  Skin tears -Dressings per nursing staff protocol  Dementia?  -Oriented  to time place and person but it doesn't seem that he is very clear as to why he is in the hospital- follow mental status-he may not be safe to return home alone  Medication noncompliance -Discussed with patient the need to take medications regularly-we'll need to reiterate prior to discharge    Code Status:     Code Status Orders        Start     Ordered   07/31/15 0820  Do not attempt resuscitation (DNR)   Continuous    Question Answer Comment  In the event of cardiac or respiratory ARREST Do not call a "code blue"   In the event of cardiac or respiratory ARREST Do not perform Intubation, CPR, defibrillation or ACLS   In the event of cardiac or respiratory ARREST Use medication by any route, position, wound care, and other measures to relive pain and suffering. May use oxygen, suction and manual treatment of airway obstruction as needed for comfort.      07/31/15 0820     Family Communication:  Disposition Plan: Home with 24-hour supervision as home health RN to ensure he is taking his medications versus SNF DVT prophylaxis: Heparin Consultants: Procedures: Appt with PCP: Requested Antibiotics: Anti-infectives    None      Objective: Filed Weights   07/31/15 1600  Weight: 62.506 kg (137 lb 12.8 oz)    Intake/Output Summary (Last 24 hours) at 08/01/15 1540 Last data filed at 08/01/15 1500  Gross per 24 hour  Intake 2484.59 ml  Output      0 ml  Net 2484.59 ml     Vitals Filed Vitals:  07/31/15 1633 07/31/15 2212 08/01/15 0515 08/01/15 0927  BP:    132/74  Pulse:  71    Temp:  98.5 F (36.9 C) 98.1 F (36.7 C)   TempSrc:  Oral Oral   Resp:  18    Height:      Weight:      SpO2: 98% 98% 100%     Exam:  General:  Pt is alert, not in acute distress, nightly confused  HEENT: No icterus, No thrush, oral mucosa moist  Cardiovascular: regular rate and rhythm, S1/S2 No murmur  Respiratory: clear to auscultation bilaterally   Abdomen: Soft, +Bowel  sounds, non tender, non distended, no guarding  MSK: No LE edema, cyanosis or clubbing  Skin: Numerous bruises and skin tears  Data Reviewed: Basic Metabolic Panel:  Recent Labs Lab 07/31/15 0526 08/01/15 0502  NA 142 137  K 4.0 3.7  CL 103 104  CO2 17* 24  GLUCOSE 89 110*  BUN 18 21*  CREATININE 1.05 0.90  CALCIUM 9.1 7.9*   Liver Function Tests:  Recent Labs Lab 07/31/15 0526  AST 119*  ALT 59  ALKPHOS 37*  BILITOT 1.2  PROT 7.1  ALBUMIN 4.1   No results for input(s): LIPASE, AMYLASE in the last 168 hours. No results for input(s): AMMONIA in the last 168 hours. CBC:  Recent Labs Lab 07/31/15 0526 08/01/15 0502  WBC 5.3 3.4*  NEUTROABS 3.9  --   HGB 13.9 11.6*  HCT 40.0 32.8*  MCV 102.3* 101.9*  PLT 123* 105*   Cardiac Enzymes:  Recent Labs Lab 07/31/15 1428  CKTOTAL 418*   BNP (last 3 results) No results for input(s): BNP in the last 8760 hours.  ProBNP (last 3 results)  Recent Labs  09/16/14 1431  PROBNP 2391.0*    CBG: No results for input(s): GLUCAP in the last 168 hours.  No results found for this or any previous visit (from the past 240 hour(s)).   Studies: Ct Head Wo Contrast  07/31/2015   CLINICAL DATA:  Dizziness.  Fall.  EXAM: CT HEAD WITHOUT CONTRAST  TECHNIQUE: Contiguous axial images were obtained from the base of the skull through the vertex without intravenous contrast.  COMPARISON:  CT 06/12/2015  FINDINGS: Stable generous cerebral atrophy.No intracranial hemorrhage, mass effect, or midline shift. No hydrocephalus. The basilar cisterns are patent. No evidence of territorial infarct. No intracranial fluid collection. Calvarium is intact. Included paranasal sinuses and mastoid air cells are well aerated.  IMPRESSION: No acute intracranial abnormality.   Electronically Signed   By: Jeb Levering M.D.   On: 07/31/2015 06:46    Scheduled Meds:  Scheduled Meds: . allopurinol  100 mg Oral Daily  . antiseptic oral rinse  7  mL Mouth Rinse q12n4p  . aspirin EC  81 mg Oral Daily  . atorvastatin  40 mg Oral Daily  . carbidopa-levodopa  0.5 tablet Oral TID  . chlorhexidine  15 mL Mouth Rinse BID  . diltiazem  120 mg Oral Daily  . folic acid  1 mg Oral Daily  . heparin  5,000 Units Subcutaneous 3 times per day  . lisinopril  20 mg Oral Daily  . multivitamin with minerals  1 tablet Oral Daily  . sodium chloride  3 mL Intravenous Q12H  . thiamine  100 mg Oral Daily   Or  . thiamine  100 mg Intravenous Daily   Continuous Infusions: . sodium chloride 100 mL/hr at 08/01/15 0135    Time spent on care  of this patient: 35 min   [place], MD 08/01/2015, 3:40 PM  LOS: 1 day   [facility] Office  [phone] Pager - Text Page per [email or web address] If 7PM-7AM, please contact night-coverage [email or web address]

## 2015-08-02 DIAGNOSIS — E872 Acidosis: Secondary | ICD-10-CM

## 2015-08-02 DIAGNOSIS — G20 Parkinson's disease: Secondary | ICD-10-CM

## 2015-08-02 DIAGNOSIS — I10 Essential (primary) hypertension: Secondary | ICD-10-CM

## 2015-08-02 DIAGNOSIS — I951 Orthostatic hypotension: Secondary | ICD-10-CM

## 2015-08-02 LAB — CBC
HCT: 34.5 % — ABNORMAL LOW (ref 39.0–52.0)
Hemoglobin: 12 g/dL — ABNORMAL LOW (ref 13.0–17.0)
MCH: 35.7 pg — AB (ref 26.0–34.0)
MCHC: 34.8 g/dL (ref 30.0–36.0)
MCV: 102.7 fL — AB (ref 78.0–100.0)
PLATELETS: 125 10*3/uL — AB (ref 150–400)
RBC: 3.36 MIL/uL — ABNORMAL LOW (ref 4.22–5.81)
RDW: 13.4 % (ref 11.5–15.5)
WBC: 4.8 10*3/uL (ref 4.0–10.5)

## 2015-08-02 LAB — BASIC METABOLIC PANEL
Anion gap: 12 (ref 5–15)
BUN: 13 mg/dL (ref 6–20)
CALCIUM: 8.1 mg/dL — AB (ref 8.9–10.3)
CHLORIDE: 105 mmol/L (ref 101–111)
CO2: 22 mmol/L (ref 22–32)
CREATININE: 0.82 mg/dL (ref 0.61–1.24)
GFR calc Af Amer: >60 mL/min (ref >60)
GFR calc non Af Amer: >60 mL/min (ref >60)
Glucose, Bld: 103 mg/dL — ABNORMAL HIGH (ref 65–99)
Potassium: 3.3 mmol/L — ABNORMAL LOW (ref 3.5–5.1)
SODIUM: 139 mmol/L (ref 135–145)

## 2015-08-02 LAB — GLUCOSE, CAPILLARY: Glucose-Capillary: 113 mg/dL — ABNORMAL HIGH (ref 65–99)

## 2015-08-02 MED ORDER — POTASSIUM CHLORIDE IN NACL 40-0.9 MEQ/L-% IV SOLN
INTRAVENOUS | Status: DC
  Administered 2015-08-02 – 2015-08-03 (×3): 100 mL/h via INTRAVENOUS
  Filled 2015-08-02 (×5): qty 1000

## 2015-08-02 MED ORDER — SODIUM CHLORIDE 0.9 % IV BOLUS (SEPSIS)
500.0000 mL | Freq: Once | INTRAVENOUS | Status: AC
  Administered 2015-08-02: 500 mL via INTRAVENOUS

## 2015-08-02 MED ORDER — POTASSIUM CHLORIDE CRYS ER 20 MEQ PO TBCR
40.0000 meq | EXTENDED_RELEASE_TABLET | Freq: Once | ORAL | Status: DC
  Filled 2015-08-02: qty 2

## 2015-08-02 NOTE — Progress Notes (Signed)
Pt not alert enough to swallow meds, d/t being up all night and getting ativan at 0645, notified MD Coralyn Pear, MD d/c'd PO potassium.  08/02/2015 Rosine Beat, RN

## 2015-08-02 NOTE — Progress Notes (Signed)
[facility] Progress Note   Victor Clarke  [id number]  DOB: [birth year]  DOA: 07/31/2015 PCP: Milagros Evener, MD  Brief narrative: Victor Clarke is a 81 y.o. male with a history of hypertension, ectopic atrial tachycardia, Parkinson's, COPD and gout was alone at home. The patient states that he came to the hospital because of dizziness that's been going on for a few days. According to ER notes he was brought in because of fall and weakness. When the EMS arrived at his residence there was blood throughout the hallway and he was bleeding from both his arms from skin tears. He mentioned to me that he had stopped taking his medications a couple of weeks ago.   Subjective: Patient is obtunded, got IV ativan this morning.   Assessment/Plan: Principal Problem:  Profound dehydration -Patient with history of Parkinson's disease presenting with complaints of dizziness. Suspect decreased by mouth intake over the past several days with associated alcohol abuse.  -Initial labs revealed an elevated lactate of 3.1 that came down to 1.9 with IV fluid resuscitation.  Active Problems: Orthostatic hypotension. -This is likely secondary to autonomic dysfunction in setting of Parkinson's disease, which may have been aggravated by concomitant dehydration. -Providing IV fluid resuscitation  Elevated alcohol level - Patient agitated this morning likely as a result of alcohol withdrawal. He was given IV Ativan -He is on CIWA protocol   Ectopic atrial tachycardia -Resumed Cardizem-- follow on telemetry   Parkinson disease -Resume carbidopa/levodopa   Hypertension Resumed Cardizem and lisinopril   Dementia -Suspect he may have Parkinson's related dementia, could not assess cognitive status given probable acute withdrawal from alcohol.     Code Status:     Code Status Orders        Start     Ordered   07/31/15 0820  Do not attempt resuscitation (DNR)   Continuous     Question Answer Comment  In the event of cardiac or respiratory ARREST Do not call a "code blue"   In the event of cardiac or respiratory ARREST Do not perform Intubation, CPR, defibrillation or ACLS   In the event of cardiac or respiratory ARREST Use medication by any route, position, wound care, and other measures to relive pain and suffering. May use oxygen, suction and manual treatment of airway obstruction as needed for comfort.      07/31/15 0820     Family Communication:  Disposition Plan: Home with 24-hour supervision as home health RN to ensure he is taking his medications versus SNF DVT prophylaxis: Heparin Consultants: Procedures: Appt with PCP: Requested Antibiotics: Anti-infectives    None      Objective: Filed Weights   07/31/15 1600  Weight: 62.506 kg (137 lb 12.8 oz)    Intake/Output Summary (Last 24 hours) at 08/02/15 1937 Last data filed at 08/02/15 1813  Gross per 24 hour  Intake 1041.67 ml  Output   1900 ml  Net -858.33 ml     Vitals Filed Vitals:   08/01/15 2129 08/02/15 0109 08/02/15 0609 08/02/15 1247  BP: 116/64 97/72 115/62 118/81  Pulse: 62 75 77 72  Temp: 98.6 F (37 C) 98.3 F (36.8 C) 98.4 F (36.9 C) 99.5 F (37.5 C)  TempSrc: Oral Oral Oral Axillary  Resp: 18 20 20 20   Height:      Weight:      SpO2: 96% 96% 99% 95%    Exam:  General:  Patient says obtunded, received IV Ativan this morning, difficult to arouse, not  following commands for me.  HEENT: No icterus, No thrush, oral mucosa moist  Cardiovascular: regular rate and rhythm, S1/S2 No murmur  Respiratory: clear to auscultation bilaterally   Abdomen: Soft, +Bowel sounds, non tender, non distended, no guarding  MSK: No LE edema, cyanosis or clubbing  Skin: Numerous bruises and skin tears  Data Reviewed: Basic Metabolic Panel:  Recent Labs Lab 07/31/15 0526 08/01/15 0502 08/02/15 0544  NA 142 137 139  K 4.0 3.7 3.3*  CL 103 104 105  CO2 17* 24 22   GLUCOSE 89 110* 103*  BUN 18 21* 13  CREATININE 1.05 0.90 0.82  CALCIUM 9.1 7.9* 8.1*   Liver Function Tests:  Recent Labs Lab 07/31/15 0526  AST 119*  ALT 59  ALKPHOS 37*  BILITOT 1.2  PROT 7.1  ALBUMIN 4.1   No results for input(s): LIPASE, AMYLASE in the last 168 hours. No results for input(s): AMMONIA in the last 168 hours. CBC:  Recent Labs Lab 07/31/15 0526 08/01/15 0502 08/02/15 0544  WBC 5.3 3.4* 4.8  NEUTROABS 3.9  --   --   HGB 13.9 11.6* 12.0*  HCT 40.0 32.8* 34.5*  MCV 102.3* 101.9* 102.7*  PLT 123* 105* 125*   Cardiac Enzymes:  Recent Labs Lab 07/31/15 1428  CKTOTAL 418*   BNP (last 3 results) No results for input(s): BNP in the last 8760 hours.  ProBNP (last 3 results)  Recent Labs  09/16/14 1431  PROBNP 2391.0*    CBG: No results for input(s): GLUCAP in the last 168 hours.  No results found for this or any previous visit (from the past 240 hour(s)).   Studies: No results found.  Scheduled Meds:  Scheduled Meds: . allopurinol  100 mg Oral Daily  . antiseptic oral rinse  7 mL Mouth Rinse q12n4p  . aspirin EC  81 mg Oral Daily  . atorvastatin  40 mg Oral Daily  . carbidopa-levodopa  0.5 tablet Oral TID  . chlorhexidine  15 mL Mouth Rinse BID  . diltiazem  120 mg Oral Daily  . folic acid  1 mg Oral Daily  . heparin  5,000 Units Subcutaneous 3 times per day  . lisinopril  20 mg Oral Daily  . multivitamin with minerals  1 tablet Oral Daily  . sodium chloride  3 mL Intravenous Q12H  . thiamine  100 mg Oral Daily   Or  . thiamine  100 mg Intravenous Daily   Continuous Infusions: . 0.9 % NaCl with KCl 40 mEq / L 100 mL/hr (08/02/15 1418)    Time spent on care of this patient: 25 min   Kelvin Cellar, MD 08/02/2015, 7:37 PM  LOS: 2 days   [facility] Office  [phone] Pager - Text Page per [email or web address] If 7PM-7AM, please contact night-coverage [email or web address]

## 2015-08-02 NOTE — Care Management Important Message (Signed)
importantImportant Message  Patient Details  Name: [name] MRN: [id number] Date of Birth: 07-Nov-1929   Medicare Important Message Given:  Yes-second notification given    Camillo Flaming 08/02/2015, 11:41 AMImportant Message  Patient Details  Name: [name] MRN: [id number] Date of Birth: [birth year]   Medicare Important Message Given:  Yes-second notification given    Camillo Flaming 08/02/2015, 11:40 AM

## 2015-08-02 NOTE — Progress Notes (Signed)
PT Cancellation Note  Patient Details Name: Victor Clarke MRN: [id number] DOB: 11-Jun-1929   Cancelled Treatment:     cancel per RN, pt was up most of last night restless and now sleeping with a sitter in the room.   Nathanial Rancher 08/02/2015, 2:31 PM

## 2015-08-03 DIAGNOSIS — F10230 Alcohol dependence with withdrawal, uncomplicated: Secondary | ICD-10-CM

## 2015-08-03 LAB — BASIC METABOLIC PANEL
ANION GAP: 7 (ref 5–15)
BUN: 9 mg/dL (ref 6–20)
CO2: 21 mmol/L — AB (ref 22–32)
Calcium: 7.5 mg/dL — ABNORMAL LOW (ref 8.9–10.3)
Chloride: 112 mmol/L — ABNORMAL HIGH (ref 101–111)
Creatinine, Ser: 0.8 mg/dL (ref 0.61–1.24)
GFR calc Af Amer: >60 mL/min (ref >60)
GFR calc non Af Amer: >60 mL/min (ref >60)
GLUCOSE: 104 mg/dL — AB (ref 65–99)
POTASSIUM: 3.4 mmol/L — AB (ref 3.5–5.1)
Sodium: 140 mmol/L (ref 135–145)

## 2015-08-03 LAB — CBC
HEMATOCRIT: 30.6 % — AB (ref 39.0–52.0)
HEMOGLOBIN: 10.8 g/dL — AB (ref 13.0–17.0)
MCH: 36.4 pg — AB (ref 26.0–34.0)
MCHC: 35.3 g/dL (ref 30.0–36.0)
MCV: 103 fL — ABNORMAL HIGH (ref 78.0–100.0)
Platelets: 100 10*3/uL — ABNORMAL LOW (ref 150–400)
RBC: 2.97 MIL/uL — ABNORMAL LOW (ref 4.22–5.81)
RDW: 13.6 % (ref 11.5–15.5)
WBC: 3.8 10*3/uL — ABNORMAL LOW (ref 4.0–10.5)

## 2015-08-03 MED ORDER — LORAZEPAM 2 MG/ML IJ SOLN
0.0000 mg | Freq: Four times a day (QID) | INTRAMUSCULAR | Status: DC
  Administered 2015-08-04 (×2): 2 mg via INTRAVENOUS
  Filled 2015-08-03 (×2): qty 1

## 2015-08-03 MED ORDER — LORAZEPAM 2 MG/ML IJ SOLN: 1.0000 mg | Freq: Four times a day (QID) | INTRAMUSCULAR | Status: DC | PRN

## 2015-08-03 MED ORDER — LORAZEPAM 1 MG PO TABS: 1.0000 mg | ORAL_TABLET | Freq: Four times a day (QID) | ORAL | Status: DC | PRN

## 2015-08-03 MED ORDER — LORAZEPAM 2 MG/ML IJ SOLN: 0.0000 mg | Freq: Two times a day (BID) | INTRAMUSCULAR | Status: DC

## 2015-08-03 MED ORDER — ADULT MULTIVITAMIN W/MINERALS CH: 1.0000 | ORAL_TABLET | Freq: Every day | ORAL | Status: DC

## 2015-08-03 MED ORDER — FOLIC ACID 1 MG PO TABS: 1.0000 mg | ORAL_TABLET | Freq: Every day | ORAL | Status: DC

## 2015-08-03 NOTE — Progress Notes (Signed)
Pt highly agitated. Ripping tele leads of at least 5 times an hour, pulling off mittens, shouting garbled words, no orientation except to self. Pt has received IV ativan twice this shift. Will continue to monitor.

## 2015-08-03 NOTE — Progress Notes (Signed)
Physical Therapy Treatment Patient Details Name: Victor Clarke MRN: [id number] DOB: Sep 24, 1929 Today's Date: 08/03/2015    History of Present Illness 81 year old male with a history of hypertension, ectopic atrial tachycardia, Parkinson's, COPD and gout from home alone. Pt reported dizziness and weakness upon admission notes however unable to recall reason for admission when asked during PT evaluation.  pt admitted for high anion gap metabolic acidosis and dizziness possibly due to orthostatic hypotension.    PT Comments    Pt in bed with B mittens difficult to arouse.  Pt required + 2 assist to get OOB and amb to bathroom.  Very unsteady gait and required + 2 assist.  Decreased amb distance.  Increased tremors.   Pt will need ST Rehab at SNF prior to D/C back home.    Follow Up Recommendations  SNF     Equipment Recommendations       Recommendations for Other Services       Precautions / Restrictions Precautions Precautions: Fall Precaution Comments: Hx Parkinsons/COPD    Mobility  Bed Mobility Overal bed mobility: Needs Assistance Bed Mobility: Supine to Sit     Supine to sit: +2 for safety/equipment;Mod assist     General bed mobility comments: required increased assist due to increased tremors and weakness   Transfers Overall transfer level: Needs assistance Equipment used: Rolling walker (2 wheeled) Transfers: Sit to/from Stand Sit to Stand: +2 physical assistance;Mod assist;Max assist         General transfer comment: required increased assist and repeat VC's.   Ambulation/Gait Ambulation/Gait assistance: +2 physical assistance;+2 safety/equipment;Mod assist Ambulation Distance (Feet): 20 Feet Assistive device: Rolling walker (2 wheeled)   Gait velocity: decreased   General Gait Details: very unsteady shaky gait.  Required increased assist and tolerated decreased distance.  HIGH FALL RISK.    Stairs            Wheelchair Mobility     Modified Rankin (Stroke Patients Only)       Balance                                    Cognition Arousal/Alertness: Awake/alert   Overall Cognitive Status: No family/caregiver present to determine baseline cognitive functioning                      Exercises      General Comments        Pertinent Vitals/Pain Pain Assessment: No/denies pain    Home Living                      Prior Function            PT Goals (current goals can now be found in the care plan section) Progress towards PT goals: Progressing toward goals    Frequency  Min 3X/week    PT Plan      Co-evaluation             End of Session Equipment Utilized During Treatment: Gait belt Activity Tolerance: Patient tolerated treatment well Patient left: in chair;with call bell/phone within reach;with chair alarm set     Time: 1030-1110 PT Time Calculation (min) (ACUTE ONLY): 40 min  Charges:  $Gait Training: 8-22 mins $Therapeutic Activity: 23-37 mins                    G Codes:  Rica Koyanagi  PTA [facility]  Acute  Rehab Pager      [phone]

## 2015-08-03 NOTE — Progress Notes (Signed)
Morning BP noted. PT agitated and fighting BP cuff. Will continue to monitor.

## 2015-08-03 NOTE — Progress Notes (Signed)
[facility] Progress Note   Victor Clarke  [id number]  DOB: 1929/09/16  DOA: 07/31/2015 PCP: Milagros Evener, MD  Brief narrative: Victor Clarke is a 81 y.o. male with a history of hypertension, ectopic atrial tachycardia, Parkinson's, COPD and gout was alone at home. The patient states that he came to the hospital because of dizziness that's been going on for a few days. According to ER notes he was brought in because of fall and weakness. When the EMS arrived at his residence there was blood throughout the hallway and he was bleeding from both his arms from skin tears. He mentioned to me that he had stopped taking his medications a couple of weeks ago.   Subjective: Patient appears much better today he is sitting at bedside chair tolerating by mouth intake. Remains confused however can't follow commands.  Assessment/Plan: Principal Problem:  Profound dehydration -Patient with history of Parkinson's disease presenting with complaints of dizziness. Suspect decreased by mouth intake over the past several days with associated alcohol abuse.  -Initial labs revealed an elevated lactate of 3.1 that came down to 1.9 with IV fluid resuscitation. -Will discontinue IV fluids encourage oral intake.  Active Problems: Orthostatic hypotension. -This is likely secondary to autonomic dysfunction in setting of Parkinson's disease, which may have been aggravated by concomitant dehydration and alcohol abuse.  Alcohol abuse - I suspect that his encephalopathy may be related to alcohol withdrawal. With his clinical improvement today he was able to tell me that he drinks gin nearly every day -Will continue CIWA   Parkinson disease -Continue carbidopa/levodopa -As mentioned above his orthostatic hypotension may be related to autonomic dysfunction   Hypertension -Continue Cardizem and lisinopril  Dementia -Suspect he may have Parkinson's related dementia, could not assess cognitive  status given probable acute withdrawal from alcohol.     Code Status:     Code Status Orders        Start     Ordered   07/31/15 0820  Do not attempt resuscitation (DNR)   Continuous    Question Answer Comment  In the event of cardiac or respiratory ARREST Do not call a "code blue"   In the event of cardiac or respiratory ARREST Do not perform Intubation, CPR, defibrillation or ACLS   In the event of cardiac or respiratory ARREST Use medication by any route, position, wound care, and other measures to relive pain and suffering. May use oxygen, suction and manual treatment of airway obstruction as needed for comfort.      07/31/15 0820     Family Communication:  Disposition Plan: Home with 24-hour supervision as home health RN to ensure he is taking his medications versus SNF DVT prophylaxis: Heparin Consultants: Procedures: Appt with PCP: Requested Antibiotics: Anti-infectives    None      Objective: Filed Weights   07/31/15 1600  Weight: 62.506 kg (137 lb 12.8 oz)    Intake/Output Summary (Last 24 hours) at 08/03/15 1800 Last data filed at 08/03/15 1356  Gross per 24 hour  Intake   1570 ml  Output   1300 ml  Net    270 ml     Vitals Filed Vitals:   08/02/15 1247 08/02/15 2232 08/03/15 0442 08/03/15 1612  BP: 118/81 163/79 173/84 157/71  Pulse: 72 68 63 72  Temp: 99.5 F (37.5 C) 97.4 F (36.3 C) 97.7 F (36.5 C) 97.6 F (36.4 C)  TempSrc: Axillary Oral Oral Oral  Resp: 20 18 18 18   Height:  Weight:      SpO2: 95% 98% 100% 100%    Exam:  General:  Patient says obtunded, received IV Ativan this morning, difficult to arouse, not following commands for me.  HEENT: No icterus, No thrush, oral mucosa moist  Cardiovascular: regular rate and rhythm, S1/S2 No murmur  Respiratory: clear to auscultation bilaterally   Abdomen: Soft, +Bowel sounds, non tender, non distended, no guarding  MSK: No LE edema, cyanosis or clubbing  Skin: Numerous  bruises and skin tears  Data Reviewed: Basic Metabolic Panel:  Recent Labs Lab 07/31/15 0526 08/01/15 0502 08/02/15 0544 08/03/15 0452  NA 142 137 139 140  K 4.0 3.7 3.3* 3.4*  CL 103 104 105 112*  CO2 17* 24 22 21*  GLUCOSE 89 110* 103* 104*  BUN 18 21* 13 9  CREATININE 1.05 0.90 0.82 0.80  CALCIUM 9.1 7.9* 8.1* 7.5*   Liver Function Tests:  Recent Labs Lab 07/31/15 0526  AST 119*  ALT 59  ALKPHOS 37*  BILITOT 1.2  PROT 7.1  ALBUMIN 4.1   No results for input(s): LIPASE, AMYLASE in the last 168 hours. No results for input(s): AMMONIA in the last 168 hours. CBC:  Recent Labs Lab 07/31/15 0526 08/01/15 0502 08/02/15 0544 08/03/15 0452  WBC 5.3 3.4* 4.8 3.8*  NEUTROABS 3.9  --   --   --   HGB 13.9 11.6* 12.0* 10.8*  HCT 40.0 32.8* 34.5* 30.6*  MCV 102.3* 101.9* 102.7* 103.0*  PLT 123* 105* 125* 100*   Cardiac Enzymes:  Recent Labs Lab 07/31/15 1428  CKTOTAL 418*   BNP (last 3 results) No results for input(s): BNP in the last 8760 hours.  ProBNP (last 3 results)  Recent Labs  09/16/14 1431  PROBNP 2391.0*    CBG:  Recent Labs Lab 08/02/15 2119  GLUCAP 113*    No results found for this or any previous visit (from the past 240 hour(s)).   Studies: No results found.  Scheduled Meds:  Scheduled Meds: . allopurinol  100 mg Oral Daily  . antiseptic oral rinse  7 mL Mouth Rinse q12n4p  . aspirin EC  81 mg Oral Daily  . atorvastatin  40 mg Oral Daily  . carbidopa-levodopa  0.5 tablet Oral TID  . chlorhexidine  15 mL Mouth Rinse BID  . diltiazem  120 mg Oral Daily  . folic acid  1 mg Oral Daily  . heparin  5,000 Units Subcutaneous 3 times per day  . lisinopril  20 mg Oral Daily  . multivitamin with minerals  1 tablet Oral Daily  . sodium chloride  3 mL Intravenous Q12H  . thiamine  100 mg Oral Daily   Or  . thiamine  100 mg Intravenous Daily   Continuous Infusions: . 0.9 % NaCl with KCl 40 mEq / L 100 mL/hr (08/03/15 1215)     Time spent on care of this patient: 20 min   Kelvin Cellar, MD 08/03/2015, 6:00 PM  LOS: 3 days   [facility] Office  [phone] Pager - Text Page per [email or web address] If 7PM-7AM, please contact night-coverage [email or web address]

## 2015-08-03 NOTE — Clinical Social Work Placement (Signed)
   CLINICAL SOCIAL WORK PLACEMENT  NOTE  Date:  08/03/2015  Patient Details  Name: KADARIUS CUFFE MRN: [id number] Date of Birth: 01/19/1929  Clinical Social Work is seeking post-discharge placement for this patient at the [facility] level of care (*CSW will initial, date and re-position this form in  chart as items are completed):  Yes   Patient/family provided with [facility] Work Department's list of facilities offering this level of care within the geographic area requested by the patient (or if unable, by the patient's family).  Yes   Patient/family informed of their freedom to choose among providers that offer the needed level of care, that participate in Medicare, Medicaid or managed care program needed by the patient, have an available bed and are willing to accept the patient.  Yes   Patient/family informed of [facility]'s ownership interest in [facility] and [facility], as well as of the fact that they are under no obligation to receive care at these facilities.  PASRR submitted to EDS on 08/03/15     PASRR number received on 08/03/15     Existing PASRR number confirmed on       FL2 transmitted to all facilities in geographic area requested by pt/family on 08/03/15     FL2 transmitted to all facilities within larger geographic area on       Patient informed that his/her managed care company has contracts with or will negotiate with certain facilities, including the following:            Patient/family informed of bed offers received.  Patient chooses bed at       Physician recommends and patient chooses bed at      Patient to be transferred to   on  .  Patient to be transferred to facility by       Patient family notified on   of transfer.  Name of family member notified:        PHYSICIAN Please sign FL2, Please sign DNR     Additional Comment:    _______________________________________________ Ladell Pier,  LCSW 08/03/2015, 4:30 PM

## 2015-08-03 NOTE — Clinical Social Work Note (Signed)
Clinical Social Work Assessment  Patient Details  Name: Victor Clarke MRN: [id number] Date of Birth: 18-Jan-1929  Date of referral:  08/03/15               Reason for consult:  Discharge Planning                Permission sought to share information with:    Permission granted to share information::     Name::        Agency::     Relationship::     Contact Information:     Housing/Transportation Living arrangements for the past 2 months:  Single Family Home Source of Information:  Patient Patient Interpreter Needed:  None Criminal Activity/Legal Involvement Pertinent to Current Situation/Hospitalization:  No - Comment as needed Significant Relationships:  None Lives with:  Self Do you feel safe going back to the place where you live?  No Need for family participation in patient care:  No (Coment) (pt does not identify any family)  Care giving concerns:  Pt admitted from home alone. Pt requiring assistance with ADL's.   Social Worker assessment / plan:  CSW received referral for new SNF.   CSW met with pt at bedside. CSW introduced self and explained role. RN states that pt has been answering questions appropriately this morning. Pt reports that he lives alone. CSW discussed recommendation for rehab at [facility] and pt agreeable. Pt states, "I think that will help". Pt agreeable to SNF search. CSW inquired with pt if pt had any family and pt states that he does not have any family to call.  CSW completed FL2 and initiated SNF search to [facility].  CSW to follow up with pt re: SNF bed offers.  CSW to continue to follow to provide support and assist with pt disposition needs.   Employment status:  Retired Forensic scientist:  Medicare PT Recommendations:  [facility] / Referral to community resources:  [facility]  Patient/Family's Response to care:  Pt alert and oriented and answering questions appropriately. Pt appears to recognize  that he cannot return home alone.  Patient/Family's Understanding of and Emotional Response to Diagnosis, Current Treatment, and Prognosis:  Unable to assess response to diagnosis and treatment. Pt very slow to respond secondary to parkinson's disease  Emotional Assessment Appearance:  Appears stated age Attitude/Demeanor/Rapport:  Other (pt appropriate) Affect (typically observed):  Appropriate Orientation:  Oriented to Self, Oriented to Place Alcohol / Substance use:  Not Applicable Psych involvement (Current and /or in the community):  No (Comment)  Discharge Needs  Concerns to be addressed:  Discharge Planning Concerns Readmission within the last 30 days:    Current discharge risk:  Lives alone Barriers to Discharge:  Continued Medical Work up   Alison Murray A, LCSW 08/03/2015, 4:13 PM  [phone]

## 2015-08-04 ENCOUNTER — Inpatient Hospital Stay (HOSPITAL_COMMUNITY): Payer: Medicare Other

## 2015-08-04 DIAGNOSIS — F10231 Alcohol dependence with withdrawal delirium: Principal | ICD-10-CM

## 2015-08-04 DIAGNOSIS — J810 Acute pulmonary edema: Secondary | ICD-10-CM

## 2015-08-04 LAB — BLOOD GAS, ARTERIAL
Acid-base deficit: 5.8 mmol/L — ABNORMAL HIGH (ref 0.0–2.0)
BICARBONATE: 16.1 meq/L — AB (ref 20.0–24.0)
DRAWN BY: 365291
O2 SAT: 89.4 %
PATIENT TEMPERATURE: 98.6
PH ART: 7.465 — AB (ref 7.350–7.450)
TCO2: 14.5 mmol/L (ref 0–100)
pCO2 arterial: 22.7 mmHg — ABNORMAL LOW (ref 35.0–45.0)
pO2, Arterial: 57.7 mmHg — ABNORMAL LOW (ref 80.0–100.0)

## 2015-08-04 LAB — CBC
HEMATOCRIT: 36.9 % — AB (ref 39.0–52.0)
HEMOGLOBIN: 12.5 g/dL — AB (ref 13.0–17.0)
MCH: 34.6 pg — ABNORMAL HIGH (ref 26.0–34.0)
MCHC: 33.9 g/dL (ref 30.0–36.0)
MCV: 102.2 fL — ABNORMAL HIGH (ref 78.0–100.0)
Platelets: 120 10*3/uL — ABNORMAL LOW (ref 150–400)
RBC: 3.61 MIL/uL — AB (ref 4.22–5.81)
RDW: 13.6 % (ref 11.5–15.5)
WBC: 6.9 10*3/uL (ref 4.0–10.5)

## 2015-08-04 LAB — BASIC METABOLIC PANEL
ANION GAP: 13 (ref 5–15)
BUN: 9 mg/dL (ref 6–20)
CALCIUM: 8.1 mg/dL — AB (ref 8.9–10.3)
CO2: 19 mmol/L — ABNORMAL LOW (ref 22–32)
Chloride: 106 mmol/L (ref 101–111)
Creatinine, Ser: 0.86 mg/dL (ref 0.61–1.24)
Glucose, Bld: 95 mg/dL (ref 65–99)
POTASSIUM: 3.8 mmol/L (ref 3.5–5.1)
Sodium: 138 mmol/L (ref 135–145)

## 2015-08-04 MED ORDER — FUROSEMIDE 10 MG/ML IJ SOLN
20.0000 mg | Freq: Once | INTRAMUSCULAR | Status: AC
  Administered 2015-08-04: 20 mg via INTRAVENOUS
  Filled 2015-08-04: qty 2

## 2015-08-04 NOTE — Progress Notes (Signed)
PT informed this RN of increase in RR. Pt denies pain, does not appear in distress, O2 levels WNL. MD notified. MD came to assess. Will continue to monitor.

## 2015-08-04 NOTE — Progress Notes (Signed)
[facility] Progress Note   Victor Clarke  LZJ:[id number]  DOB: 05-26-1929  DOA: 07/31/2015 PCP: Milagros Evener, MD  Brief narrative: Victor Clarke is a 81 y.o. male with a history of hypertension, ectopic atrial tachycardia, Parkinson's, COPD and gout was alone at home. The patient states that he came to the hospital because of dizziness that's been going on for a few days. According to ER notes he was brought in because of fall and weakness. When the EMS arrived at his residence there was blood throughout the hallway and he was bleeding from both his arms from skin tears. He mentioned to me that he had stopped taking his medications a couple of weeks ago.   Subjective: Patient appears much better today he is sitting at bedside chair tolerating by mouth intake. Remains confused however can't follow commands.  Assessment/Plan: Principal Problem:  Profound dehydration -Patient with history of Parkinson's disease presenting with complaints of dizziness. Suspect decreased by mouth intake over the past several days with associated alcohol abuse.  -Initial labs revealed an elevated lactate of 3.1 that came down to 1.9 with IV fluid resuscitation. -Patient going into respiratory distress this morning, chest x-ray showing findings consistent with CHF and/or volume overload. IV fluids have been stopped as I will administer IV Lasix at this time.  Active Problems: Respiratory distress likely secondary to fluid overload -Patient was given IV fluid resuscitation for profound dehydration during this hospitalization -Nursing staff reporting that he has had increased work of breathing. A stat chest x-ray findings consistent with volume overload. -IV fluids have been stopped, will provide 20 mg of Lasix now, monitor clinically.  Acute encephalopathy. -Patient appearing lethargic during my encounter. He was arousable however and could follow commands for me. On my neurologic examination he  did not appear to have focal neurological deficits. -Nursing staff reporting that he was given 2 mg of IV Ativan this morning as part of CIWA protocol. -Instructed nursing staff to hold IV Ativan for now and monitor over the course of the day.  Orthostatic hypotension. -This is likely secondary to autonomic dysfunction in setting of Parkinson's disease, which may have been aggravated by concomitant dehydration and alcohol abuse.  Alcohol abuse - I suspect that his encephalopathy may be related to alcohol withdrawal. With his clinical improvement today he was able to tell me that he drinks gin nearly every day -On this mornings evaluation he was obtunded which I suspect was related to receiving 2 mg of IV Ativan from CIWA protocol. IV Ativan has been held for now.  Parkinson disease -Continue carbidopa/levodopa -As mentioned above his orthostatic hypotension may be related to autonomic dysfunction   Hypertension -Continue Cardizem and lisinopril  Dementia -Suspect he may have Parkinson's related dementia, could not assess cognitive status given probable acute withdrawal from alcohol.     Code Status:     Code Status Orders        Start     Ordered   07/31/15 0820  Do not attempt resuscitation (DNR)   Continuous    Question Answer Comment  In the event of cardiac or respiratory ARREST Do not call a "code blue"   In the event of cardiac or respiratory ARREST Do not perform Intubation, CPR, defibrillation or ACLS   In the event of cardiac or respiratory ARREST Use medication by any route, position, wound care, and other measures to relive pain and suffering. May use oxygen, suction and manual treatment of airway obstruction as needed for comfort.  07/31/15 0820     Family Communication: I have not been able to communicate with family members. Phone number listed in the chart (brother) is actually a law firm and they did not know anyone by his brother's name. Disposition  Plan: Home with 24-hour supervision as home health RN to ensure he is taking his medications versus SNF DVT prophylaxis: Heparin Consultants: Procedures: Appt with PCP: Requested Antibiotics: Anti-infectives    None      Objective: Filed Weights   07/31/15 1600  Weight: 62.506 kg (137 lb 12.8 oz)    Intake/Output Summary (Last 24 hours) at 08/04/15 1406 Last data filed at 08/04/15 1330  Gross per 24 hour  Intake    570 ml  Output    775 ml  Net   -205 ml     Vitals Filed Vitals:   08/03/15 1612 08/04/15 0001 08/04/15 0549 08/04/15 1312  BP: 157/71 152/73 157/84 151/83  Pulse: 72 76 84 104  Temp: 97.6 F (36.4 C) 98.6 F (37 C) 99.2 F (37.3 C) 100.9 F (38.3 C)  TempSrc: Oral Oral Oral Axillary  Resp: 18 19 22 24   Height:      Weight:      SpO2: 100% 96% 98% 88%    Exam:  General:  Patient is obtunded, however was arousable and could follow commands  HEENT: No icterus, No thrush, oral mucosa moist  Cardiovascular: regular rate and rhythm, S1/S2 No murmur  Respiratory: clear to auscultation bilaterally   Abdomen: Soft, +Bowel sounds, non tender, non distended, no guarding  MSK: No LE edema, cyanosis or clubbing  Skin: Numerous bruises and skin tears  Neurological. Pupils were 2-3 mm in diameter, equal round and reactive to light. Extraocular movement was intact. He is able to follow commands. Had bilateral 5 muscle strength to bilateral upper extremities and lower extremities. Reflexes were intact. I do not appreciate focal neurological deficits.  Data Reviewed: Basic Metabolic Panel:  Recent Labs Lab 07/31/15 0526 08/01/15 0502 08/02/15 0544 08/03/15 0452 08/04/15 1323  NA 142 137 139 140 138  K 4.0 3.7 3.3* 3.4* 3.8  CL 103 104 105 112* 106  CO2 17* 24 22 21* 19*  GLUCOSE 89 110* 103* 104* 95  BUN 18 21* 13 9 9   CREATININE 1.05 0.90 0.82 0.80 0.86  CALCIUM 9.1 7.9* 8.1* 7.5* 8.1*   Liver Function Tests:  Recent Labs Lab 07/31/15 0526   AST 119*  ALT 59  ALKPHOS 37*  BILITOT 1.2  PROT 7.1  ALBUMIN 4.1   No results for input(s): LIPASE, AMYLASE in the last 168 hours. No results for input(s): AMMONIA in the last 168 hours. CBC:  Recent Labs Lab 07/31/15 0526 08/01/15 0502 08/02/15 0544 08/03/15 0452 08/04/15 1323  WBC 5.3 3.4* 4.8 3.8* 6.9  NEUTROABS 3.9  --   --   --   --   HGB 13.9 11.6* 12.0* 10.8* 12.5*  HCT 40.0 32.8* 34.5* 30.6* 36.9*  MCV 102.3* 101.9* 102.7* 103.0* 102.2*  PLT 123* 105* 125* 100* 120*   Cardiac Enzymes:  Recent Labs Lab 07/31/15 1428  CKTOTAL 418*   BNP (last 3 results) No results for input(s): BNP in the last 8760 hours.  ProBNP (last 3 results)  Recent Labs  09/16/14 1431  PROBNP 2391.0*    CBG:  Recent Labs Lab 08/02/15 2119  GLUCAP 113*    No results found for this or any previous visit (from the past 240 hour(s)).   Studies: Dg Chest  1 View  08/04/2015   CLINICAL DATA:  Acute onset of shortness of breath earlier today. Current history of interstitial pulmonary fibrosis.  EXAM: CHEST  1 VIEW  COMPARISON:  09/16/2014 and earlier, including CTA chest 09/16/2014.  FINDINGS: Suboptimal inspiration. Cardiac silhouette mildly to moderately enlarged. Interval development of diffuse interstitial pulmonary opacities, superimposed upon the baseline changes of pulmonary fibrosis. No confluent airspace consolidation. Possible small bilateral pleural effusions. Note is made of calcification in the distal right supraspinatus tendon at its insertion on the greater tuberosity of the humerus.  IMPRESSION: CHF and/or fluid overload is suspected with mild diffuse interstitial pulmonary edema superimposed upon the baseline interstitial fibrosis.   Electronically Signed   By: Evangeline Dakin M.D.   On: 08/04/2015 12:12    Scheduled Meds:  Scheduled Meds: . allopurinol  100 mg Oral Daily  . antiseptic oral rinse  7 mL Mouth Rinse q12n4p  . aspirin EC  81 mg Oral Daily  .  atorvastatin  40 mg Oral Daily  . carbidopa-levodopa  0.5 tablet Oral TID  . chlorhexidine  15 mL Mouth Rinse BID  . diltiazem  120 mg Oral Daily  . folic acid  1 mg Oral Daily  . furosemide  20 mg Intravenous Once  . heparin  5,000 Units Subcutaneous 3 times per day  . lisinopril  20 mg Oral Daily  . LORazepam  0-4 mg Intravenous Q6H   Followed by  . [START ON 08/05/2015] LORazepam  0-4 mg Intravenous Q12H  . multivitamin with minerals  1 tablet Oral Daily  . sodium chloride  3 mL Intravenous Q12H  . thiamine  100 mg Oral Daily   Continuous Infusions:    Time spent on care of this patient: 30 min   Kelvin Cellar, MD 08/04/2015, 2:06 PM  LOS: 4 days   [facility] Office  [phone] Pager - Text Page per [email or web address] If 7PM-7AM, please contact night-coverage [email or web address]

## 2015-08-04 NOTE — Progress Notes (Signed)
Physical Therapy Treatment Patient Details Name: Victor Clarke MRN: [id number] DOB: 1929/05/12 Today's Date: 08/04/2015    History of Present Illness 81 year old male with a history of hypertension, ectopic atrial tachycardia, Parkinson's, COPD and gout from home alone. Pt reported dizziness and weakness upon admission notes however unable to recall reason for admission when asked during PT evaluation.  pt admitted for high anion gap metabolic acidosis and dizziness possibly due to orthostatic hypotension.    PT Comments    SIGNIFICANT DECLINE Pt was amb 3 days ago 160 feet at min guard assist.  Yesterday was able to walk to bathroom then sit in recliner and feed self.  On arrival pt in bed restless/eyes shut/unable to arouse and high RR.  Required + 2 assist to sit EOB to attempt arousal/orientation.  Pt rigid and offered no assist.  Eyes remained closed despite several stimuli techniques.  Offered a sip of water while upright and pt required cueing to swallow.  Noted gurgling. Sat EOB x 5 min with no positive results so with + 2 total assist returned to supine position.  Pt is restless and only spoke a few words.  RN notified.   Follow Up Recommendations  SNF     Equipment Recommendations       Recommendations for Other Services       Precautions / Restrictions Precautions Precautions: Fall Precaution Comments: Hx Parkinsons/COPD    Mobility  Bed Mobility Overal bed mobility: Needs Assistance Bed Mobility: Supine to Sit;Sit to Supine     Supine to sit: Total assist;+2 for physical assistance;+2 for safety/equipment Sit to supine: Total assist;+2 for physical assistance;+2 for safety/equipment   General bed mobility comments: total assist + 2 pt 0% restless/grabing  Transfers Overall transfer level: Needs assistance Equipment used: None Transfers: Sit to/from Stand Sit to Stand: Total assist;+2 physical assistance;+2 safety/equipment         General transfer  comment: total assist + 2 sit to stand for hygiene and attempt increase arrousal.  Pt 10%  Ambulation/Gait             General Gait Details: unable to attempt   Stairs            Wheelchair Mobility    Modified Rankin (Stroke Patients Only)       Balance                                    Cognition   Behavior During Therapy: Restless                   General Comments: unable to arrouse/eyes shut/heavy breathing/moaning "I don't feel good"    Exercises      General Comments        Pertinent Vitals/Pain      Home Living                      Prior Function            PT Goals (current goals can now be found in the care plan section) Progress towards PT goals: Progressing toward goals    Frequency  Min 3X/week    PT Plan      Co-evaluation             End of Session Equipment Utilized During Treatment: Gait belt Activity Tolerance: Patient limited by lethargy Patient left: in bed;with call bell/phone  within reach;with bed alarm set;with family/visitor present     Time: 1100-1115 PT Time Calculation (min) (ACUTE ONLY): 15 min  Charges:  $Therapeutic Activity: 8-22 mins                    G Codes:      Rica Koyanagi  PTA [facility]  Acute  Rehab Pager      [phone]

## 2015-08-05 ENCOUNTER — Inpatient Hospital Stay (HOSPITAL_COMMUNITY): Payer: Medicare Other

## 2015-08-05 DIAGNOSIS — N3000 Acute cystitis without hematuria: Secondary | ICD-10-CM

## 2015-08-05 DIAGNOSIS — J9601 Acute respiratory failure with hypoxia: Secondary | ICD-10-CM

## 2015-08-05 LAB — BASIC METABOLIC PANEL
Anion gap: 16 — ABNORMAL HIGH (ref 5–15)
BUN: 14 mg/dL (ref 6–20)
CALCIUM: 7.7 mg/dL — AB (ref 8.9–10.3)
CO2: 19 mmol/L — ABNORMAL LOW (ref 22–32)
CREATININE: 1.04 mg/dL (ref 0.61–1.24)
Chloride: 103 mmol/L (ref 101–111)
GFR calc non Af Amer: >60 mL/min (ref >60)
Glucose, Bld: 106 mg/dL — ABNORMAL HIGH (ref 65–99)
Potassium: 3 mmol/L — ABNORMAL LOW (ref 3.5–5.1)
SODIUM: 138 mmol/L (ref 135–145)

## 2015-08-05 LAB — URINALYSIS, ROUTINE W REFLEX MICROSCOPIC
Glucose, UA: NEGATIVE mg/dL
Ketones, ur: 40 mg/dL — AB
NITRITE: POSITIVE — AB
PROTEIN: 30 mg/dL — AB
SPECIFIC GRAVITY, URINE: 1.021 (ref 1.005–1.030)
UROBILINOGEN UA: 1 mg/dL (ref 0.0–1.0)
pH: 5.5 (ref 5.0–8.0)

## 2015-08-05 LAB — URINE MICROSCOPIC-ADD ON

## 2015-08-05 LAB — CBC
HCT: 37.5 % — ABNORMAL LOW (ref 39.0–52.0)
Hemoglobin: 13.1 g/dL (ref 13.0–17.0)
MCH: 35 pg — AB (ref 26.0–34.0)
MCHC: 34.9 g/dL (ref 30.0–36.0)
MCV: 100.3 fL — ABNORMAL HIGH (ref 78.0–100.0)
PLATELETS: 149 10*3/uL — AB (ref 150–400)
RBC: 3.74 MIL/uL — AB (ref 4.22–5.81)
RDW: 13.5 % (ref 11.5–15.5)
WBC: 6.6 10*3/uL (ref 4.0–10.5)

## 2015-08-05 MED ORDER — POTASSIUM CHLORIDE CRYS ER 20 MEQ PO TBCR
40.0000 meq | EXTENDED_RELEASE_TABLET | Freq: Four times a day (QID) | ORAL | Status: AC
  Administered 2015-08-05 (×2): 40 meq via ORAL
  Filled 2015-08-05 (×2): qty 2

## 2015-08-05 MED ORDER — VANCOMYCIN HCL 500 MG IV SOLR
500.0000 mg | Freq: Two times a day (BID) | INTRAVENOUS | Status: DC
  Administered 2015-08-06 (×2): 500 mg via INTRAVENOUS
  Filled 2015-08-05 (×2): qty 500

## 2015-08-05 MED ORDER — CETYLPYRIDINIUM CHLORIDE 0.05 % MT LIQD
7.0000 mL | Freq: Two times a day (BID) | OROMUCOSAL | Status: DC
  Administered 2015-08-06 – 2015-08-07 (×2): 7 mL via OROMUCOSAL

## 2015-08-05 MED ORDER — ENSURE ENLIVE PO LIQD
237.0000 mL | Freq: Three times a day (TID) | ORAL | Status: DC
  Administered 2015-08-05 – 2015-08-08 (×6): 237 mL via ORAL

## 2015-08-05 MED ORDER — VANCOMYCIN HCL IN DEXTROSE 1-5 GM/200ML-% IV SOLN
1000.0000 mg | Freq: Once | INTRAVENOUS | Status: AC
  Administered 2015-08-05: 1000 mg via INTRAVENOUS
  Filled 2015-08-05: qty 200

## 2015-08-05 MED ORDER — PIPERACILLIN-TAZOBACTAM 3.375 G IVPB
3.3750 g | Freq: Three times a day (TID) | INTRAVENOUS | Status: DC
  Administered 2015-08-05 – 2015-08-08 (×9): 3.375 g via INTRAVENOUS
  Filled 2015-08-05 (×9): qty 50

## 2015-08-05 NOTE — Progress Notes (Signed)
ANTIBIOTIC CONSULT NOTE - INITIAL  Pharmacy Consult for Vancomycin Indication: rule out pneumonia, aspiration vs. HCAP  No Known Allergies  Patient Measurements: Height: 5\' 9"  (175.3 cm) Weight: 137 lb 12.8 oz (62.506 kg) IBW/kg (Calculated) : 70.7  Vital Signs: Temp: 99.5 F (37.5 C) (09/17 1055) Temp Source: Axillary (09/17 1055) BP: 152/72 mmHg (09/17 0830) Pulse Rate: 98 (09/17 0830) Intake/Output from previous day: 09/16 0701 - 09/17 0700 In: 0  Out: 1550 [Urine:1550] Intake/Output from this shift: Total I/O In: 120 [P.O.:120] Out: -   Labs:  Recent Labs  08/03/15 0452 08/04/15 1323 08/05/15 0605  WBC 3.8* 6.9 6.6  HGB 10.8* 12.5* 13.1  PLT 100* 120* 149*  CREATININE 0.80 0.86 1.04   Estimated Creatinine Clearance: 46.7 mL/min (by C-G formula based on Cr of 1.04). No results for input(s): VANCOTROUGH, VANCOPEAK, VANCORANDOM, GENTTROUGH, GENTPEAK, GENTRANDOM, TOBRATROUGH, TOBRAPEAK, TOBRARND, AMIKACINPEAK, AMIKACINTROU, AMIKACIN in the last 72 hours.   Microbiology: No results found for this or any previous visit (from the past 720 hour(s)).  Medical History: Past Medical History  Diagnosis Date  . Hypertension   . Hypercholesteremia   . Gout   . COPD (chronic obstructive pulmonary disease)   . Detached retina     a. s/p surgical correction on the right.  . Prostate cancer     a. s/p TURP.  Marland Kitchen Ectopic atrial tachycardia     a. 08/2014 Echo: EF 55-60%, mildly dil LA.  . Parkinson's disease   . Left inguinal hernia     a. s/p repair ~ 10 yrs ago.    Medications:  Anti-infectives    Start     Dose/Rate Route Frequency Ordered Stop   08/05/15 1230  vancomycin (VANCOCIN) IVPB 1000 mg/200 mL premix     1,000 mg 200 mL/hr over 60 Minutes Intravenous  Once 08/05/15 1141     08/05/15 1200  piperacillin-tazobactam (ZOSYN) IVPB 3.375 g     3.375 g 12.5 mL/hr over 240 Minutes Intravenous 3 times per day 08/05/15 1122       Assessment: 81yo M was  admitted from home on 9/12 w/ dizziness, lethargy, and falling at home. Now running fevers and respiratory rate is elevated. 9/17 Zosyn was started and pharmacy is asked to dose Vancomycin for possible aspiration pneumonia vs. HCAP. SCr wnl, CrCl 47CG, 54N.   Goal of Therapy:  Vancomycin trough level 15-20 mcg/ml  Appropriate antibiotic dosing for renal function; eradication of infection  Plan:  Vancomycin 1g x 1 then 500mg  IV q12h. Agree with Zosyn 3.375g IV Q8H infused over 4hrs. Measure Vanc trough at steady state. Follow up renal fxn, culture results, and clinical course.  Romeo Rabon, PharmD, pager [phone]. 08/05/2015,11:51 AM.

## 2015-08-05 NOTE — Progress Notes (Signed)
[facility] Progress Note   CURLY MACKOWSKI  BZJ:[id number]  DOB: 08-13-1929  DOA: 07/31/2015 PCP: Milagros Evener, MD  Brief narrative: Victor Clarke is a 81 y.o. male with a history of hypertension, ectopic atrial tachycardia, Parkinson's, COPD and gout was alone at home. The patient states that he came to the hospital because of dizziness that's been going on for a few days. According to ER notes he was brought in because of fall and weakness. When the EMS arrived at his residence there was blood throughout the hallway and he was bleeding from both his arms from skin tears. He mentioned to me that he had stopped taking his medications a couple of weeks ago.   Subjective: Mr. Hochstatter remains encephalopathic, poor historian. It seems his respiratory status has improved after receiving IV Lasix yesterday. This morning he spiked a temperature of 101.2. Nursing staff reporting that he has had minimal by mouth intake today, though was able to drink some milk.   Assessment/Plan: Principal Problem:  Delirium -Patient remains confused, disoriented, having significant mental status changes which I think is likely multifactorial. This morning he spiked a temperature of 101.2 making developing infectious process all likelihood. I think this is also coming from alcohol withdrawal. -He was started on empiric IV antimicrobial therapy with vancomycin and Zosyn -Has one-to-one sitter.  -Will continue to reorient him, alcohol withdrawal may necessitate the utilization of benzodiazepine therapy. Will monitor closely.  Urinary tract infection. -Patient's remaining delirious as this morning he spiked a temperature 101.2. -I ordered 2 sets of blood cultures obtained a urinalysis along with a chest x-ray and start him on empiric IV antimicrobial therapy with vancomycin and Zosyn. -Urine analysis consistent with urinary tract infection, showed many bacteria and presence of nitrates and leukocyte  Estrace. I believe this is contributing to his delirium. -For now will treat with broad-spectrum empiric IV antimicrobial therapy with vancomycin and Zosyn, follow-up on urine cultures.  Profound dehydration -Patient with history of Parkinson's disease presenting with complaints of dizziness. Suspect decreased by mouth intake over the past several days with associated alcohol abuse.  -Initial labs revealed an elevated lactate of 3.1 that came down to 1.9 with IV fluid resuscitation. -Patient going into respiratory distress this morning, chest x-ray showing findings consistent with CHF and/or volume overload.    Respiratory failure likely secondary to fluid overload -Evidenced by an O2 sat of 88%, ABG showing PO2 of 57.7. -Respiratory failure likely secondary to developing pulmonary edema from volume overload. -He was initially given volume due to dehydration -IV fluids stopped as he was given IV Lasix on 08/04/2015 with improvement to his respiratory status.  Orthostatic hypotension. -This is likely secondary to autonomic dysfunction in setting of Parkinson's disease, which may have been aggravated by concomitant dehydration and alcohol abuse.  Alcohol abuse -Patient having signs symptoms of alcohol withdrawal for which she was placed on CIWA protocol.  Parkinson disease -Continue carbidopa/levodopa -As mentioned above his orthostatic hypotension may be related to autonomic dysfunction   Hypertension -Continue Cardizem and lisinopril  Dementia -Suspect he may have Parkinson's related dementia, could not assess cognitive status given probable acute withdrawal from alcohol.     Code Status:     Code Status Orders        Start     Ordered   07/31/15 0820  Do not attempt resuscitation (DNR)   Continuous    Question Answer Comment  In the event of cardiac or respiratory ARREST Do not call a "code  blue"   In the event of cardiac or respiratory ARREST Do not perform Intubation,  CPR, defibrillation or ACLS   In the event of cardiac or respiratory ARREST Use medication by any route, position, wound care, and other measures to relive pain and suffering. May use oxygen, suction and manual treatment of airway obstruction as needed for comfort.      07/31/15 0820     Family Communication: I have not been able to communicate with family members. Phone number listed in the chart (brother) is actually a law firm and they did not know anyone by his brother's name. Patient is unable to give me family members contact information. Disposition Plan: Home with 24-hour supervision as home health RN to ensure he is taking his medications versus SNF DVT prophylaxis: Heparin Consultants: Procedures: Appt with PCP: Requested Antibiotics: Anti-infectives    Start     Dose/Rate Route Frequency Ordered Stop   08/05/15 2359  vancomycin (VANCOCIN) 500 mg in sodium chloride 0.9 % 100 mL IVPB     500 mg 100 mL/hr over 60 Minutes Intravenous Every 12 hours 08/05/15 1153     08/05/15 1230  vancomycin (VANCOCIN) IVPB 1000 mg/200 mL premix     1,000 mg 200 mL/hr over 60 Minutes Intravenous  Once 08/05/15 1141 08/05/15 1345   08/05/15 1200  piperacillin-tazobactam (ZOSYN) IVPB 3.375 g     3.375 g 12.5 mL/hr over 240 Minutes Intravenous 3 times per day 08/05/15 1122        Objective: Filed Weights   07/31/15 1600  Weight: 62.506 kg (137 lb 12.8 oz)    Intake/Output Summary (Last 24 hours) at 08/05/15 1408 Last data filed at 08/05/15 0918  Gross per 24 hour  Intake    120 ml  Output   1325 ml  Net  -1205 ml     Vitals Filed Vitals:   08/05/15 0631 08/05/15 0830 08/05/15 1055 08/05/15 1307  BP: 163/86 152/72  110/62  Pulse: 94 98  87  Temp: 101.2 F (38.4 C)  99.5 F (37.5 C)   TempSrc: Axillary  Axillary   Resp: 28     Height:      Weight:      SpO2: 100%       Exam:  General:  Patient is confused, disoriented, usually distractible, ongoing mental status  changes.  HEENT: No icterus, No thrush, oral mucosa moist  Cardiovascular: regular rate and rhythm, S1/S2 No murmur  Respiratory: clear to auscultation bilaterally   Abdomen: Soft, +Bowel sounds, non tender, non distended, no guarding  MSK: No LE edema, cyanosis or clubbing  Skin: Numerous bruises and skin tears  Neurological. He is having nonfocal neurologic examination.  Data Reviewed: Basic Metabolic Panel:  Recent Labs Lab 08/01/15 0502 08/02/15 0544 08/03/15 0452 08/04/15 1323 08/05/15 0605  NA 137 139 140 138 138  K 3.7 3.3* 3.4* 3.8 3.0*  CL 104 105 112* 106 103  CO2 24 22 21* 19* 19*  GLUCOSE 110* 103* 104* 95 106*  BUN 21* 13 9 9 14   CREATININE 0.90 0.82 0.80 0.86 1.04  CALCIUM 7.9* 8.1* 7.5* 8.1* 7.7*   Liver Function Tests:  Recent Labs Lab 07/31/15 0526  AST 119*  ALT 59  ALKPHOS 37*  BILITOT 1.2  PROT 7.1  ALBUMIN 4.1   No results for input(s): LIPASE, AMYLASE in the last 168 hours. No results for input(s): AMMONIA in the last 168 hours. CBC:  Recent Labs Lab 07/31/15 0526 08/01/15 0502 08/02/15  8469 08/03/15 0452 08/04/15 1323 08/05/15 0605  WBC 5.3 3.4* 4.8 3.8* 6.9 6.6  NEUTROABS 3.9  --   --   --   --   --   HGB 13.9 11.6* 12.0* 10.8* 12.5* 13.1  HCT 40.0 32.8* 34.5* 30.6* 36.9* 37.5*  MCV 102.3* 101.9* 102.7* 103.0* 102.2* 100.3*  PLT 123* 105* 125* 100* 120* 149*   Cardiac Enzymes:  Recent Labs Lab 07/31/15 1428  CKTOTAL 418*   BNP (last 3 results) No results for input(s): BNP in the last 8760 hours.  ProBNP (last 3 results)  Recent Labs  09/16/14 1431  PROBNP 2391.0*    CBG:  Recent Labs Lab 08/02/15 2119  GLUCAP 113*    No results found for this or any previous visit (from the past 240 hour(s)).   Studies: Dg Chest 1 View  08/04/2015   CLINICAL DATA:  Acute onset of shortness of breath earlier today. Current history of interstitial pulmonary fibrosis.  EXAM: CHEST  1 VIEW  COMPARISON:  09/16/2014 and  earlier, including CTA chest 09/16/2014.  FINDINGS: Suboptimal inspiration. Cardiac silhouette mildly to moderately enlarged. Interval development of diffuse interstitial pulmonary opacities, superimposed upon the baseline changes of pulmonary fibrosis. No confluent airspace consolidation. Possible small bilateral pleural effusions. Note is made of calcification in the distal right supraspinatus tendon at its insertion on the greater tuberosity of the humerus.  IMPRESSION: CHF and/or fluid overload is suspected with mild diffuse interstitial pulmonary edema superimposed upon the baseline interstitial fibrosis.   Electronically Signed   By: Evangeline Dakin M.D.   On: 08/04/2015 12:12   Dg Chest Port 1 View  08/05/2015   CLINICAL DATA:  Shortness of breath, fever  EXAM: PORTABLE CHEST - 1 VIEW  COMPARISON:  08/04/2015, 09/16/2014 ; CT chest 09/16/2014  FINDINGS: There is bilateral diffuse interstitial thickening. There is no pleural effusion or pneumothorax. There is no focal consolidation. The heart and mediastinal contours are unremarkable. There is thoracic aortic atherosclerosis.  No acute osseous abnormality. Mild chronic widening of the left AC joint.  IMPRESSION: Mild bilateral diffuse interstitial thickening likely reflecting underlying chronic interstitial lung disease. Mild superimposed infection or interstitial edema cannot be excluded.   Electronically Signed   By: Kathreen Devoid   On: 08/05/2015 11:59    Scheduled Meds:  Scheduled Meds: . allopurinol  100 mg Oral Daily  . antiseptic oral rinse  7 mL Mouth Rinse q12n4p  . antiseptic oral rinse  7 mL Mouth Rinse BID  . aspirin EC  81 mg Oral Daily  . atorvastatin  40 mg Oral Daily  . carbidopa-levodopa  0.5 tablet Oral TID  . chlorhexidine  15 mL Mouth Rinse BID  . diltiazem  120 mg Oral Daily  . feeding supplement (ENSURE ENLIVE)  237 mL Oral TID BM  . folic acid  1 mg Oral Daily  . heparin  5,000 Units Subcutaneous 3 times per day  .  lisinopril  20 mg Oral Daily  . multivitamin with minerals  1 tablet Oral Daily  . piperacillin-tazobactam (ZOSYN)  IV  3.375 g Intravenous 3 times per day  . potassium chloride  40 mEq Oral Q6H  . sodium chloride  3 mL Intravenous Q12H  . thiamine  100 mg Oral Daily  . vancomycin  500 mg Intravenous Q12H   Continuous Infusions:    Time spent on care of this patient: 59min   Kelvin Cellar, MD 08/05/2015, 2:08 PM  LOS: 5 days   [facility]  Office  [phone] Pager - Text Page per [email or web address] If 7PM-7AM, please contact night-coverage [email or web address]

## 2015-08-06 DIAGNOSIS — R410 Disorientation, unspecified: Secondary | ICD-10-CM

## 2015-08-06 LAB — CBC
HCT: 33.9 % — ABNORMAL LOW (ref 39.0–52.0)
Hemoglobin: 11.8 g/dL — ABNORMAL LOW (ref 13.0–17.0)
MCH: 34.5 pg — ABNORMAL HIGH (ref 26.0–34.0)
MCHC: 34.8 g/dL (ref 30.0–36.0)
MCV: 99.1 fL (ref 78.0–100.0)
PLATELETS: 169 10*3/uL (ref 150–400)
RBC: 3.42 MIL/uL — AB (ref 4.22–5.81)
RDW: 13.5 % (ref 11.5–15.5)
WBC: 6.1 10*3/uL (ref 4.0–10.5)

## 2015-08-06 LAB — BASIC METABOLIC PANEL
Anion gap: 13 (ref 5–15)
BUN: 22 mg/dL — AB (ref 6–20)
CALCIUM: 7.7 mg/dL — AB (ref 8.9–10.3)
CHLORIDE: 110 mmol/L (ref 101–111)
CO2: 18 mmol/L — ABNORMAL LOW (ref 22–32)
CREATININE: 0.95 mg/dL (ref 0.61–1.24)
Glucose, Bld: 117 mg/dL — ABNORMAL HIGH (ref 65–99)
Potassium: 3 mmol/L — ABNORMAL LOW (ref 3.5–5.1)
SODIUM: 141 mmol/L (ref 135–145)

## 2015-08-06 MED ORDER — POTASSIUM CHLORIDE CRYS ER 20 MEQ PO TBCR
40.0000 meq | EXTENDED_RELEASE_TABLET | Freq: Four times a day (QID) | ORAL | Status: AC
  Administered 2015-08-06 (×2): 40 meq via ORAL
  Filled 2015-08-06 (×2): qty 2

## 2015-08-06 NOTE — Clinical Social Work Note (Signed)
CSW met with pt to provide SNF bed offers.  CSW explained the choices that pt may choose and also provided explanation about insurance (pt has medicare a and b) and that pt needs to go directly to SNF for medicare to pay.  CSW explained that if pt goes home and then decides he needs SNF his insurance will not cover it.  CSW also encouraged pt to explore his thoughts and feelings related to rehab  Pt appears low to comprehend but did understand that he will need a SNF for rehab at discharge.  Pt stated that he was "worried and concerned" about the fact that he has no family or friends to help with SNF decision.  Pt discussed having a brother that lives out of state and did not know his number by heart.  The number is in his car that is in [place].  Pt discussed being transported by EMS from [place]. Pt stated that he needed a day to comprehend the rehab for decision but did state that he realized he needed to go in order to gain strength back.  Dede Query, LCSW [facility] - Weekend Coverage cell #: [phone]

## 2015-08-06 NOTE — Progress Notes (Signed)
[facility] Progress Note   Victor Clarke  HFW:[id number]  DOB: 1928/12/07  DOA: 07/31/2015 PCP: Milagros Evener, MD  Brief narrative: Victor Clarke is a 81 y.o. male with a history of hypertension, ectopic atrial tachycardia, Parkinson's, COPD and gout was alone at home. The patient states that he came to the hospital because of dizziness that's been going on for a few days. According to ER notes he was brought in because of fall and weakness. When the EMS arrived at his residence there was blood throughout the hallway and he was bleeding from both his arms from skin tears. He mentioned to me that he had stopped taking his medications a couple of weeks ago.   Subjective: Victor Clarke is markedly improved today, more responsive, able answer several of my questions appropriately. He was assisted out of bed to chair. Remained afebrile overnight  Assessment/Plan: Principal Problem:  Delirium -On 08/05/2015 he became increasingly confused, disoriented, having significant mental status changes and spiked a temperature of 101.2 making developing infectious process all likelihood. I also thought that this could E coming from alcohol withdrawal. -He was started on empiric IV antimicrobial therapy with vancomycin and Zosyn -Urinalysis showing turbid urine that had many bacteria, nitrates and leukocyte Estrace. -Stop the vancomycin 08/06/2015. -He is markedly improved on today's exam becoming more awake and alert and was assisted out of bed to chair.  Urinary tract infection. -Patient spiking temperature of 101.2 on 08/05/2015, urine was turbid showing many bacteria and white blood cells -Blood culture showing no growth after overnight incubation -He remains on Zosyn IV, awaiting urine culture results  Profound dehydration -Patient with history of Parkinson's disease presenting with complaints of dizziness. Suspect decreased by mouth intake over the past several days with associated  alcohol abuse.  -Initial labs revealed an elevated lactate of 3.1 that came down to 1.9 with IV fluid resuscitation. -Patient going into respiratory distress on 08/04/2015, chest x-ray showing findings consistent with CHF and/or volume overload.  -Respiratory status improved with demonstration of IV Lasix   Respiratory failure likely secondary to fluid overload -Evidenced by an O2 sat of 88%, ABG showing PO2 of 57.7. -Respiratory failure likely secondary to developing pulmonary edema from volume overload. -He was initially given volume due to dehydration -IV fluids stopped as he was given IV Lasix on 08/04/2015 with improvement to his respiratory status.  Orthostatic hypotension. -This is likely secondary to autonomic dysfunction in setting of Parkinson's disease, which may have been aggravated by concomitant dehydration and alcohol abuse.  Alcohol abuse -Patient having signs symptoms of alcohol withdrawal for which she was placed on CIWA protocol.  Parkinson disease -Continue carbidopa/levodopa -As mentioned above his orthostatic hypotension may be related to autonomic dysfunction   Hypertension -Continue Cardizem and lisinopril  Dementia -Suspect he may have Parkinson's related dementia, could not assess cognitive status given probable acute withdrawal from alcohol.     Code Status:     Code Status Orders        Start     Ordered   07/31/15 0820  Do not attempt resuscitation (DNR)   Continuous    Question Answer Comment  In the event of cardiac or respiratory ARREST Do not call a "code blue"   In the event of cardiac or respiratory ARREST Do not perform Intubation, CPR, defibrillation or ACLS   In the event of cardiac or respiratory ARREST Use medication by any route, position, wound care, and other measures to relive pain and suffering. May use  oxygen, suction and manual treatment of airway obstruction as needed for comfort.      07/31/15 0820     Family  Communication: I have not been able to communicate with family members. Phone number listed in the chart (brother) is actually a law firm and they did not know anyone by his brother's name. Patient is unable to give me family members contact information. Disposition Plan: He is deconditioned and will likely require skilled nursing facility placement DVT prophylaxis: Heparin Consultants: Procedures: Appt with PCP: Requested Antibiotics: Anti-infectives    Start     Dose/Rate Route Frequency Ordered Stop   08/05/15 2359  vancomycin (VANCOCIN) 500 mg in sodium chloride 0.9 % 100 mL IVPB     500 mg 100 mL/hr over 60 Minutes Intravenous Every 12 hours 08/05/15 1153     08/05/15 1230  vancomycin (VANCOCIN) IVPB 1000 mg/200 mL premix     1,000 mg 200 mL/hr over 60 Minutes Intravenous  Once 08/05/15 1141 08/05/15 1345   08/05/15 1200  piperacillin-tazobactam (ZOSYN) IVPB 3.375 g     3.375 g 12.5 mL/hr over 240 Minutes Intravenous 3 times per day 08/05/15 1122        Objective: Filed Weights   07/31/15 1600  Weight: 62.506 kg (137 lb 12.8 oz)    Intake/Output Summary (Last 24 hours) at 08/06/15 1421 Last data filed at 08/06/15 1300  Gross per 24 hour  Intake   1070 ml  Output      0 ml  Net   1070 ml     Vitals Filed Vitals:   08/05/15 1439 08/05/15 1752 08/05/15 2043 08/06/15 0538  BP:  104/63 135/74 133/75  Pulse:  89 85 82  Temp: 98.5 F (36.9 C)  98.5 F (36.9 C) 98.3 F (36.8 C)  TempSrc: Axillary  Axillary Oral  Resp:   20 20  Height:      Weight:      SpO2:   95% 95%    Exam:  General:  Patient looks much better he is awake and alert, assisted out of bed to chair  HEENT: No icterus, No thrush, oral mucosa moist  Cardiovascular: regular rate and rhythm, S1/S2 No murmur  Respiratory: clear to auscultation bilaterally   Abdomen: Soft, +Bowel sounds, non tender, non distended, no guarding  MSK: No LE edema, cyanosis or clubbing  Skin: Numerous bruises and  skin tears  Neurological. He is having nonfocal neurologic examination.  Data Reviewed: Basic Metabolic Panel:  Recent Labs Lab 08/02/15 0544 08/03/15 0452 08/04/15 1323 08/05/15 0605 08/06/15 0520  NA 139 140 138 138 141  K 3.3* 3.4* 3.8 3.0* 3.0*  CL 105 112* 106 103 110  CO2 22 21* 19* 19* 18*  GLUCOSE 103* 104* 95 106* 117*  BUN 13 9 9 14  22*  CREATININE 0.82 0.80 0.86 1.04 0.95  CALCIUM 8.1* 7.5* 8.1* 7.7* 7.7*   Liver Function Tests:  Recent Labs Lab 07/31/15 0526  AST 119*  ALT 59  ALKPHOS 37*  BILITOT 1.2  PROT 7.1  ALBUMIN 4.1   No results for input(s): LIPASE, AMYLASE in the last 168 hours. No results for input(s): AMMONIA in the last 168 hours. CBC:  Recent Labs Lab 07/31/15 0526  08/02/15 0544 08/03/15 0452 08/04/15 1323 08/05/15 0605 08/06/15 0520  WBC 5.3  < > 4.8 3.8* 6.9 6.6 6.1  NEUTROABS 3.9  --   --   --   --   --   --   HGB  13.9  < > 12.0* 10.8* 12.5* 13.1 11.8*  HCT 40.0  < > 34.5* 30.6* 36.9* 37.5* 33.9*  MCV 102.3*  < > 102.7* 103.0* 102.2* 100.3* 99.1  PLT 123*  < > 125* 100* 120* 149* 169  < > = values in this interval not displayed. Cardiac Enzymes:  Recent Labs Lab 07/31/15 1428  CKTOTAL 418*   BNP (last 3 results) No results for input(s): BNP in the last 8760 hours.  ProBNP (last 3 results)  Recent Labs  09/16/14 1431  PROBNP 2391.0*    CBG:  Recent Labs Lab 08/02/15 2119  GLUCAP 113*    Recent Results (from the past 240 hour(s))  Culture, blood (routine x 2)     Status: None (Preliminary result)   Collection Time: 08/05/15 12:21 PM  Result Value Ref Range Status   Specimen Description BLOOD LEFT ANTECUBITAL  Final   Special Requests   Final    BOTTLES DRAWN AEROBIC AND ANAEROBIC 10CC BOTH BOTTLES   Culture   Final    NO GROWTH < 24 HOURS Performed at [facility]    Report Status PENDING  Incomplete  Culture, blood (routine x 2)     Status: None (Preliminary result)   Collection Time:  08/05/15 12:26 PM  Result Value Ref Range Status   Specimen Description BLOOD RIGHT FOREARM  Final   Special Requests   Final    BOTTLES DRAWN AEROBIC AND ANAEROBIC 10CC BOTH BOTTLES   Culture   Final    NO GROWTH < 24 HOURS Performed at [facility]    Report Status PENDING  Incomplete     Studies: Dg Chest Port 1 View  08/05/2015   CLINICAL DATA:  Shortness of breath, fever  EXAM: PORTABLE CHEST - 1 VIEW  COMPARISON:  08/04/2015, 09/16/2014 ; CT chest 09/16/2014  FINDINGS: There is bilateral diffuse interstitial thickening. There is no pleural effusion or pneumothorax. There is no focal consolidation. The heart and mediastinal contours are unremarkable. There is thoracic aortic atherosclerosis.  No acute osseous abnormality. Mild chronic widening of the left AC joint.  IMPRESSION: Mild bilateral diffuse interstitial thickening likely reflecting underlying chronic interstitial lung disease. Mild superimposed infection or interstitial edema cannot be excluded.   Electronically Signed   By: Kathreen Devoid   On: 08/05/2015 11:59    Scheduled Meds:  Scheduled Meds: . allopurinol  100 mg Oral Daily  . antiseptic oral rinse  7 mL Mouth Rinse q12n4p  . antiseptic oral rinse  7 mL Mouth Rinse BID  . aspirin EC  81 mg Oral Daily  . atorvastatin  40 mg Oral Daily  . carbidopa-levodopa  0.5 tablet Oral TID  . chlorhexidine  15 mL Mouth Rinse BID  . diltiazem  120 mg Oral Daily  . feeding supplement (ENSURE ENLIVE)  237 mL Oral TID BM  . folic acid  1 mg Oral Daily  . heparin  5,000 Units Subcutaneous 3 times per day  . lisinopril  20 mg Oral Daily  . multivitamin with minerals  1 tablet Oral Daily  . piperacillin-tazobactam (ZOSYN)  IV  3.375 g Intravenous 3 times per day  . sodium chloride  3 mL Intravenous Q12H  . thiamine  100 mg Oral Daily  . vancomycin  500 mg Intravenous Q12H   Continuous Infusions:    Time spent on care of this patient: 35 min   Kelvin Cellar,  MD 08/06/2015, 2:21 PM  LOS: 6 days   [facility]  Office  [phone] Pager - Text Page per [email or web address] If 7PM-7AM, please contact night-coverage [email or web address]

## 2015-08-06 NOTE — Clinical Social Work Placement (Signed)
   CLINICAL SOCIAL WORK PLACEMENT  NOTE  Date:  08/06/2015  Patient Details  Name: Victor Clarke MRN: [id number] Date of Birth: September 30, 1929  Clinical Social Work is seeking post-discharge placement for this patient at the [place] level of care (*CSW will initial, date and re-position this form in  chart as items are completed):  Yes   Patient/family provided with [facility]'s list of facilities offering this level of care within the geographic area requested by the patient (or if unable, by the patient's family).  Yes   Patient/family informed of their freedom to choose among providers that offer the needed level of care, that participate in Medicare, Medicaid or managed care program needed by the patient, have an available bed and are willing to accept the patient.  Yes   Patient/family informed of [facility]'s ownership interest in [facility] and [facility], as well as of the fact that they are under no obligation to receive care at these facilities.  PASRR submitted to EDS on 08/03/15     PASRR number received on 08/03/15     Existing PASRR number confirmed on       FL2 transmitted to all facilities in geographic area requested by pt/family on 08/03/15     FL2 transmitted to all facilities within larger geographic area on       Patient informed that his/her managed care company has contracts with or will negotiate with certain facilities, including the following:         08/06/15 yes   Patient/family informed of bed offers received.  Patient chooses bed at       Physician recommends and patient chooses bed at      Patient to be transferred to   on  .  Patient to be transferred to facility by       Patient family notified on   of transfer.  Name of family member notified:        PHYSICIAN Please sign FL2, Please sign DNR     Additional Comment:     _______________________________________________ Carlean Jews, LCSW 08/06/2015, 12:14 PM

## 2015-08-07 DIAGNOSIS — N390 Urinary tract infection, site not specified: Secondary | ICD-10-CM

## 2015-08-07 DIAGNOSIS — R42 Dizziness and giddiness: Secondary | ICD-10-CM

## 2015-08-07 DIAGNOSIS — R319 Hematuria, unspecified: Secondary | ICD-10-CM

## 2015-08-07 LAB — BASIC METABOLIC PANEL
Anion gap: 11 (ref 5–15)
BUN: 20 mg/dL (ref 6–20)
CHLORIDE: 112 mmol/L — AB (ref 101–111)
CO2: 20 mmol/L — AB (ref 22–32)
Calcium: 8 mg/dL — ABNORMAL LOW (ref 8.9–10.3)
Creatinine, Ser: 0.97 mg/dL (ref 0.61–1.24)
GFR calc Af Amer: >60 mL/min (ref >60)
GFR calc non Af Amer: >60 mL/min (ref >60)
GLUCOSE: 120 mg/dL — AB (ref 65–99)
POTASSIUM: 3.7 mmol/L (ref 3.5–5.1)
Sodium: 143 mmol/L (ref 135–145)

## 2015-08-07 LAB — CBC
HEMATOCRIT: 34.8 % — AB (ref 39.0–52.0)
Hemoglobin: 12.3 g/dL — ABNORMAL LOW (ref 13.0–17.0)
MCH: 35.9 pg — AB (ref 26.0–34.0)
MCHC: 35.3 g/dL (ref 30.0–36.0)
MCV: 101.5 fL — AB (ref 78.0–100.0)
Platelets: 231 10*3/uL (ref 150–400)
RBC: 3.43 MIL/uL — ABNORMAL LOW (ref 4.22–5.81)
RDW: 13.4 % (ref 11.5–15.5)
WBC: 5.2 10*3/uL (ref 4.0–10.5)

## 2015-08-07 NOTE — Progress Notes (Addendum)
[facility] Progress Note   ANTRON SETH  KVQ:[id number]  DOB: 1928-12-24  DOA: 07/31/2015 PCP: Milagros Evener, MD  Brief narrative: Victor Clarke is a 81 y.o. male with a history of hypertension, ectopic atrial tachycardia, Parkinson's, COPD and gout was alone at home. The patient states that he came to the hospital because of dizziness that's been going on for a few days. According to ER notes he was brought in because of fall and weakness. When the EMS arrived at his residence there was blood throughout the hallway and he was bleeding from both his arms from skin tears. He mentioned to me that he had stopped taking his medications a couple of weeks ago.   Subjective: Victor Clarke continues to show improvement, he was assisted out of bed to chair, he is alert and oriented x 3 now.   Assessment/Plan: Principal Problem:  Delirium -On 08/05/2015 he became increasingly confused, disoriented, having significant mental status changes and spiked a temperature of 101.2 making developing infectious process all likelihood. I also thought that this could be related to alcohol withdrawal. -He was started on empiric IV antimicrobial therapy with vancomycin and Zosyn -Urinalysis showing turbid urine that had many bacteria, nitrates and leukocyte Estrace. -Stop the vancomycin 08/06/2015. -Continues to show improvement.   Urinary tract infection. -Patient spiking temperature of 101.2 on 08/05/2015, urine was turbid showing many bacteria and white blood cells -Blood culture showing no growth after overnight incubation -He remains on Zosyn IV, awaiting urine culture results -Blood cultures showing no growth x 2 sets after 2 days  Profound dehydration -Patient with history of Parkinson's disease presenting with complaints of dizziness. Suspect decreased by mouth intake over the past several days with associated alcohol abuse.  -Initial labs revealed an elevated lactate of 3.1 that came  down to 1.9 with IV fluid resuscitation. -Patient going into respiratory distress on 08/04/2015, chest x-ray showing findings consistent with CHF and/or volume overload.  -Respiratory status improved with demonstration of IV Lasix   Respiratory failure likely secondary to fluid overload -Evidenced by an O2 sat of 88%, ABG showing PO2 of 57.7. -Respiratory failure likely secondary to developing pulmonary edema from volume overload. -He was initially given volume due to dehydration -IV fluids stopped as he was given IV Lasix on 08/04/2015 with improvement to his respiratory status.  Orthostatic hypotension. -This is likely secondary to autonomic dysfunction in setting of Parkinson's disease, which may have been aggravated by concomitant dehydration and alcohol abuse.  Alcohol abuse -Patient having signs symptoms of alcohol withdrawal for which she was placed on CIWA protocol.  Parkinson disease -Continue carbidopa/levodopa -As mentioned above his orthostatic hypotension may be related to autonomic dysfunction   Hypertension -Continue Cardizem and lisinopril  Dementia -Suspect he may have Parkinson's related dementia, could not assess cognitive status given probable acute withdrawal from alcohol.     Code Status:     Code Status Orders        Start     Ordered   07/31/15 0820  Do not attempt resuscitation (DNR)   Continuous    Question Answer Comment  In the event of cardiac or respiratory ARREST Do not call a "code blue"   In the event of cardiac or respiratory ARREST Do not perform Intubation, CPR, defibrillation or ACLS   In the event of cardiac or respiratory ARREST Use medication by any route, position, wound care, and other measures to relive pain and suffering. May use oxygen, suction and manual treatment of airway  obstruction as needed for comfort.      07/31/15 0820     Family Communication: I have not been able to communicate with family members. Phone number  listed in the chart (brother) is actually a law firm and they did not know anyone by his brother's name. Patient is unable to give me family members contact information. Disposition Plan: He is deconditioned and will likely require skilled nursing facility placement DVT prophylaxis: Heparin Consultants: Procedures: Appt with PCP: Requested Antibiotics: Anti-infectives    Start     Dose/Rate Route Frequency Ordered Stop   08/05/15 2359  vancomycin (VANCOCIN) 500 mg in sodium chloride 0.9 % 100 mL IVPB  Status:  Discontinued     500 mg 100 mL/hr over 60 Minutes Intravenous Every 12 hours 08/05/15 1153 08/06/15 1424   08/05/15 1230  vancomycin (VANCOCIN) IVPB 1000 mg/200 mL premix     1,000 mg 200 mL/hr over 60 Minutes Intravenous  Once 08/05/15 1141 08/05/15 1345   08/05/15 1200  piperacillin-tazobactam (ZOSYN) IVPB 3.375 g     3.375 g 12.5 mL/hr over 240 Minutes Intravenous 3 times per day 08/05/15 1122        Objective: Filed Weights   07/31/15 1600  Weight: 62.506 kg (137 lb 12.8 oz)    Intake/Output Summary (Last 24 hours) at 08/07/15 1620 Last data filed at 08/07/15 0900  Gross per 24 hour  Intake    200 ml  Output      0 ml  Net    200 ml     Vitals Filed Vitals:   08/07/15 0313 08/07/15 0555 08/07/15 0949 08/07/15 1530  BP: 131/70 129/58 122/63 113/71  Pulse: 72 70 75 82  Temp: 97.7 F (36.5 C) 98.1 F (36.7 C)  98.1 F (36.7 C)  TempSrc: Oral Oral  Oral  Resp: 20 20    Height:      Weight:      SpO2:  98%  97%    Exam:  General:  Patient looks much better he is awake and alert, assisted out of bed to chair, he is unsteady on his feet.   HEENT: No icterus, No thrush, oral mucosa moist  Cardiovascular: regular rate and rhythm, S1/S2 No murmur  Respiratory: clear to auscultation bilaterally   Abdomen: Soft, +Bowel sounds, non tender, non distended, no guarding  MSK: No LE edema, cyanosis or clubbing  Skin: Numerous bruises and skin  tears  Neurological. He is having nonfocal neurologic examination.  Data Reviewed: Basic Metabolic Panel:  Recent Labs Lab 08/03/15 0452 08/04/15 1323 08/05/15 0605 08/06/15 0520 08/07/15 0807  NA 140 138 138 141 143  K 3.4* 3.8 3.0* 3.0* 3.7  CL 112* 106 103 110 112*  CO2 21* 19* 19* 18* 20*  GLUCOSE 104* 95 106* 117* 120*  BUN 9 9 14  22* 20  CREATININE 0.80 0.86 1.04 0.95 0.97  CALCIUM 7.5* 8.1* 7.7* 7.7* 8.0*   Liver Function Tests: No results for input(s): AST, ALT, ALKPHOS, BILITOT, PROT, ALBUMIN in the last 168 hours. No results for input(s): LIPASE, AMYLASE in the last 168 hours. No results for input(s): AMMONIA in the last 168 hours. CBC:  Recent Labs Lab 08/03/15 0452 08/04/15 1323 08/05/15 0605 08/06/15 0520 08/07/15 0807  WBC 3.8* 6.9 6.6 6.1 5.2  HGB 10.8* 12.5* 13.1 11.8* 12.3*  HCT 30.6* 36.9* 37.5* 33.9* 34.8*  MCV 103.0* 102.2* 100.3* 99.1 101.5*  PLT 100* 120* 149* 169 231   Cardiac Enzymes: No results for  input(s): CKTOTAL, CKMB, CKMBINDEX, TROPONINI in the last 168 hours. BNP (last 3 results) No results for input(s): BNP in the last 8760 hours.  ProBNP (last 3 results)  Recent Labs  09/16/14 1431  PROBNP 2391.0*    CBG:  Recent Labs Lab 08/02/15 2119  GLUCAP 113*    Recent Results (from the past 240 hour(s))  Culture, blood (routine x 2)     Status: None (Preliminary result)   Collection Time: 08/05/15 12:21 PM  Result Value Ref Range Status   Specimen Description BLOOD LEFT ANTECUBITAL  Final   Special Requests   Final    BOTTLES DRAWN AEROBIC AND ANAEROBIC 10CC BOTH BOTTLES   Culture   Final    NO GROWTH 2 DAYS Performed at [facility]    Report Status PENDING  Incomplete  Culture, blood (routine x 2)     Status: None (Preliminary result)   Collection Time: 08/05/15 12:26 PM  Result Value Ref Range Status   Specimen Description BLOOD RIGHT FOREARM  Final   Special Requests   Final    BOTTLES DRAWN AEROBIC AND  ANAEROBIC 10CC BOTH BOTTLES   Culture   Final    NO GROWTH 2 DAYS Performed at [facility]    Report Status PENDING  Incomplete     Studies: No results found.  Scheduled Meds:  Scheduled Meds: . allopurinol  100 mg Oral Daily  . antiseptic oral rinse  7 mL Mouth Rinse q12n4p  . antiseptic oral rinse  7 mL Mouth Rinse BID  . aspirin EC  81 mg Oral Daily  . atorvastatin  40 mg Oral Daily  . carbidopa-levodopa  0.5 tablet Oral TID  . chlorhexidine  15 mL Mouth Rinse BID  . diltiazem  120 mg Oral Daily  . feeding supplement (ENSURE ENLIVE)  237 mL Oral TID BM  . folic acid  1 mg Oral Daily  . heparin  5,000 Units Subcutaneous 3 times per day  . lisinopril  20 mg Oral Daily  . multivitamin with minerals  1 tablet Oral Daily  . piperacillin-tazobactam (ZOSYN)  IV  3.375 g Intravenous 3 times per day  . sodium chloride  3 mL Intravenous Q12H  . thiamine  100 mg Oral Daily   Continuous Infusions:    Time spent on care of this patient: 15 min   Kelvin Cellar, MD 08/07/2015, 4:20 PM  LOS: 7 days   [facility] Office  [phone] Pager - Text Page per [email or web address] If 7PM-7AM, please contact night-coverage [email or web address]

## 2015-08-07 NOTE — Progress Notes (Signed)
Physical Therapy Treatment Patient Details Name: Victor SUSTAITA MRN: [id number] DOB: April 24, 1929 Today's Date: 08/07/2015    History of Present Illness 81 year old male with a history of hypertension, ectopic atrial tachycardia, Parkinson's, COPD and gout from home alone. Pt reported dizziness and weakness upon admission notes however unable to recall reason for admission when asked during PT evaluation.  pt admitted for high anion gap metabolic acidosis and dizziness possibly due to orthostatic hypotension.    PT Comments    Pt looks much better.  Wide awake and conversing.  Assisted OOB to amb in hallway a greater distance.  Cognition appears impaired.  Pleasant and following commands however required repeat functional cueing to complete task.  Just to get out of bed, pt required 4 step commands.   Pt lives home alone and will need ST Rehab at SNF   Follow Up Recommendations  SNF     Equipment Recommendations       Recommendations for Other Services       Precautions / Restrictions Precautions Precautions: Fall Precaution Comments: Hx Parkinsons/COPD    Mobility  Bed Mobility Overal bed mobility: Needs Assistance Bed Mobility: Supine to Sit     Supine to sit: Min assist     General bed mobility comments: able to self perform but needed 75% VC's repeated to stay on task.  Simple command of "let's get out of bed" pt appeared "lost" and required step by step instruction on how to finish task.    Transfers Overall transfer level: Needs assistance Equipment used: None Transfers: Sit to/from Stand Sit to Stand: Min assist         General transfer comment: 76% VC's on proper tech and hand placement.  Again, repeat functional cueing needed as pt appeared "lost" esp when assisting him to bathroom.  Ambulation/Gait Ambulation/Gait assistance: +2 safety/equipment;Min assist Ambulation Distance (Feet): 220 Feet Assistive device: Rolling walker (2 wheeled) Gait  Pattern/deviations: Step-through pattern;Decreased stride length;Shuffle;Drifts right/left Gait velocity: decreased   General Gait Details: 50% VC's to negociat around obsticles and proper walker use.  Tolerated increased distance with only 1/4 DOE.     Stairs            Wheelchair Mobility    Modified Rankin (Stroke Patients Only)       Balance                                    Cognition Arousal/Alertness: Awake/alert Behavior During Therapy:  (pleasanly confused)   Area of Impairment: Memory;Orientation;Safety/judgement;Awareness;Problem solving     Memory: Decreased short-term memory         General Comments: appears "lost".  Following commands and conversing however impaired cognition and current situation.    Exercises      General Comments        Pertinent Vitals/Pain Pain Assessment: No/denies pain    Home Living                      Prior Function            PT Goals (current goals can now be found in the care plan section) Progress towards PT goals: Progressing toward goals    Frequency  Min 3X/week    PT Plan      Co-evaluation             End of Session Equipment Utilized During Treatment: Gait belt Activity  Tolerance: Patient tolerated treatment well Patient left: in chair;with call bell/phone within reach;with chair alarm set     Time: 1322-1350 PT Time Calculation (min) (ACUTE ONLY): 28 min  Charges:  $Gait Training: 8-22 mins $Therapeutic Activity: 8-22 mins                    G Codes:      Rica Koyanagi  PTA [facility]  Acute  Rehab Pager      [phone]

## 2015-08-07 NOTE — Progress Notes (Signed)
CSW continuing to follow.   CSW followed up with pt at bedside.   Pt sitting in recliner, awake, and alert. Pt able to follow CSW conversation surrounding rehab at [facility]. CSW discussed with pt the facilities in which had offered pt a bed for short term rehab. CSW located the distance of facilities from pt home. Pt chooses [facility] as it is close to pt home. Pt reports that he does not have anyone to assist with his decisions other than his brother, [name] who lives in [place]. CSW assisted pt in looking in pt wallet to find pt brother phone number, but unable to locate.  CSW contacted [facility] and confirmed that facility could accept pt when medically ready for discharge.  CSW contacted pt PCP listed in H&P to inquire if doctors office has family contact listed. PCP office provided CSW with contact phone number to Jac Canavan, pt brother, [phone].   CSW contacted pt brother via telephone. CSW was able to reach pt brother, [name]. CSW updated pt brother regarding pt admission. Pt brother agrees that pt will need rehab at [facility]. Pt brother appreciative that CSW was able to find his phone number to reach him to update on pt.   CSW to continue to follow to provide support and assist with pt disposition needs.   Alison Murray, MSW, [place] Work [phone]

## 2015-08-08 DIAGNOSIS — F10231 Alcohol dependence with withdrawal delirium: Secondary | ICD-10-CM | POA: Diagnosis present

## 2015-08-08 DIAGNOSIS — F10931 Alcohol use, unspecified with withdrawal delirium: Secondary | ICD-10-CM | POA: Diagnosis present

## 2015-08-08 DIAGNOSIS — E860 Dehydration: Secondary | ICD-10-CM

## 2015-08-08 DIAGNOSIS — N390 Urinary tract infection, site not specified: Secondary | ICD-10-CM | POA: Diagnosis present

## 2015-08-08 MED ORDER — CEFUROXIME AXETIL 250 MG PO TABS
250.0000 mg | ORAL_TABLET | Freq: Two times a day (BID) | ORAL | Status: DC
Start: 2015-08-08 — End: 2015-08-08

## 2015-08-08 MED ORDER — CEFUROXIME AXETIL 500 MG PO TABS: 500.0000 mg | ORAL_TABLET | Freq: Two times a day (BID) | ORAL | Status: DC

## 2015-08-08 MED ORDER — ENSURE ENLIVE PO LIQD: 237.0000 mL | Freq: Three times a day (TID) | ORAL | Status: DC

## 2015-08-08 NOTE — Care Management Important Message (Signed)
Important Message  Patient Details  Name: Victor Clarke MRN: [id number] Date of Birth: 14-Feb-1929   Medicare Important Message Given:  Yes-third notification given    Camillo Flaming 08/08/2015, 10:53 AMImportant Message  Patient Details  Name: Victor Clarke MRN: [id number] Date of Birth: 08-Feb-1929   Medicare Important Message Given:  Yes-third notification given    Camillo Flaming 08/08/2015, 10:52 AM

## 2015-08-08 NOTE — Clinical Social Work Placement (Signed)
   CLINICAL SOCIAL WORK PLACEMENT  NOTE  Date:  08/08/2015  Patient Details  Name: Victor Clarke MRN: [id number] Date of Birth: 14-Apr-1929  Clinical Social Work is seeking post-discharge placement for this patient at the [place] level of care (*CSW will initial, date and re-position this form in  chart as items are completed):  Yes   Patient/family provided with [place] Work Department's list of facilities offering this level of care within the geographic area requested by the patient (or if unable, by the patient's family).  Yes   Patient/family informed of their freedom to choose among providers that offer the needed level of care, that participate in Medicare, Medicaid or managed care program needed by the patient, have an available bed and are willing to accept the patient.  Yes   Patient/family informed of [facility]'s ownership interest in [facility] and [facility], as well as of the fact that they are under no obligation to receive care at these facilities.  PASRR submitted to EDS on 08/03/15     PASRR number received on 08/03/15     Existing PASRR number confirmed on       FL2 transmitted to all facilities in geographic area requested by pt/family on 08/03/15     FL2 transmitted to all facilities within larger geographic area on       Patient informed that his/her managed care company has contracts with or will negotiate with certain facilities, including the following:        Yes   Patient/family informed of bed offers received.  Patient chooses bed at [place]     Physician recommends and patient chooses bed at      Patient to be transferred to [facility] on 08/08/15.  Patient to be transferred to facility by ambulance [name])     Patient family notified on 08/08/15 of transfer.  Name of family member notified:  pt notified at bedside. Pt brother, [name] notified via  telephone     PHYSICIAN Please sign FL2, Please sign DNR     Additional Comment:    _______________________________________________ Ladell Pier, LCSW 08/08/2015, 12:58 PM

## 2015-08-08 NOTE — Progress Notes (Signed)
Pt for discharge to [facility].   CSW facilitated pt discharge needs including contacting facility, faxing pt discharge information via [facility], discussing with pt at bedside and pt brother, [name] via telephone, pt intermittently confused today. CSW provided RN phone number to call report and arranged ambulance transport via [place].   No further social work needs identified at this time.  CSW signing off.   Alison Murray, MSW, [place] Work [phone]

## 2015-08-08 NOTE — Discharge Summary (Signed)
Physician Discharge Summary  Victor Clarke VHQ:[id number] DOB: 06-28-1929 DOA: 07/31/2015  PCP: Milagros Evener, MD  Admit date: 07/31/2015 Discharge date: 08/08/2015  Time spent: 35 minutes  Recommendations for Outpatient Follow-up:  1. Please follow-up on a BMP and a 3-4 days, patient was hypokalemic during this hospitalization 2. Would recommend treated with Ceftin 500 mg by mouth twice a day 5 days 3. Due to significant deconditioning patient discharged to skilled nursing facility  Discharge Diagnoses:  Principal Problem:   Alcohol withdrawal delirium Active Problems:   Orthostatic hypotension   Dehydration   UTI (urinary tract infection)   Parkinson disease   Hypertension   Ectopic atrial tachycardia   Dizziness   Lactic acidosis   Discharge Condition: Stable  Diet recommendation: Heart healthy  Filed Weights   07/31/15 1600  Weight: 62.506 kg (137 lb 12.8 oz)    History of present illness:  Victor Clarke is a 81 y.o. male 81 year old male with a history of hypertension, ectopic atrial tachycardia, Parkinson's, COPD and gout was alone at home. The patient states that he came to the hospital because of dizziness that's been going on for a few days. He is unable to explain to me in detail what sort of dizziness he is having. When I ask him if it is worse between laying down and sitting up, he agrees that this is mainly when he has the dizziness. I have asked him if it gets better or worse with ambulation and he states it actually gets better. He has no complaints of palpitations shortness of breath chest pain or nausea. According to ER documentation he is brought in because of a fall and weakness. Apparently when EMS arrived at his residence, there was blood all over the hallway and he was bleeding from both arms. He told the EMS that he was weak. He also tells me that he stopped taking his medications a couple of weeks ago  Hospital Course:  Victor Clarke is a pleasant  81 year old gentleman with a history of Parkinson's disease, chronic alcohol abuse, hypertension, chronic obstructive pulmonary disease, admitted to the medicine service on 07/31/2015 when he presented with complaints of dizziness and having generalized weakness that was associated with an overall functional decline and recurrent falls at home. He was found to be orthostatic and dehydrated on presentation. He was started on IV fluid resuscitation. This hospitalization was complicated by the development of delirium which I think is likely related to alcohol withdrawal delirium along with encephalopathy from urinary tract infection. He reported drinking on daily basis becoming agitated and having hallucinations on the second day of this hospitalization. He was treated with IV Ativan prior CIWA protocol. Then on 08/05/2015 he spiked a temperature 101.2 with urinalysis appearing turbid with many bacteria and white blood cells. He was started on IV Zosyn and vancomycin on 08/05/2015 for initial broad-spectrum coverage. Blood cultures remain negative 2 sets. This hospitalization was also complicated by respiratory failure likely secondary to fluid overload. He was found to be hypoxemic with an O2 sat of 80% and ABG showing PO2 of 57.7. This was further worked up with a chest x-ray that showed developing pulmonary edema consistent with volume overload which initially got for dehydration. IV fluids were stopped as he was administered IV Lasix recess with improvement to his respiratory status. Patient having significant deconditioning during this hospitalization as physical therapy has been following. Rehabilitation at skilled nursing facility would be beneficial. He resides home alone and would likely be unsafe in his  home environment. He was discharged to skilled nursing facility and 08/08/2015.   Consultations:  Physical therapy  Discharge Exam: Filed Vitals:   08/08/15 0410  BP: 131/77  Pulse: 69  Temp: 98.8  F (37.1 C)  Resp: 19    General: Patient looks much better he is awake and alert, assisted out of bed to chair   HEENT: No icterus, No thrush, oral mucosa moist  Cardiovascular: regular rate and rhythm, S1/S2 No murmur  Respiratory: clear to auscultation bilaterally   Abdomen: Soft, +Bowel sounds, non tender, non distended, no guarding  MSK: No LE edema, cyanosis or clubbing  Skin: Numerous bruises and skin tears  Neurological. He is having nonfocal neurologic examination.  Discharge Instructions   Discharge Instructions    Call MD for:  difficulty breathing, headache or visual disturbances    Complete by:  As directed      Call MD for:  extreme fatigue    Complete by:  As directed      Call MD for:  hives    Complete by:  As directed      Call MD for:  persistant dizziness or light-headedness    Complete by:  As directed      Call MD for:  persistant nausea and vomiting    Complete by:  As directed      Call MD for:  redness, tenderness, or signs of infection (pain, swelling, redness, odor or green/yellow discharge around incision site)    Complete by:  As directed      Call MD for:  severe uncontrolled pain    Complete by:  As directed      Call MD for:  temperature >100.4    Complete by:  As directed      Call MD for:    Complete by:  As directed      Diet - low sodium heart healthy    Complete by:  As directed      Increase activity slowly    Complete by:  As directed           Current Discharge Medication List    START taking these medications   Details  cefUROXime (CEFTIN) 500 MG tablet Take 1 tablet (500 mg total) by mouth 2 (two) times daily with a meal. Qty: 10 tablet, Refills: 0    feeding supplement, ENSURE ENLIVE, (ENSURE ENLIVE) LIQD Take 237 mLs by mouth 3 (three) times daily between meals. Qty: 237 mL, Refills: 12      CONTINUE these medications which have NOT CHANGED   Details  allopurinol (ZYLOPRIM) 100 MG tablet Take 100 mg by mouth  daily.     aspirin EC 81 MG tablet Take 81 mg by mouth daily.    atorvastatin (LIPITOR) 40 MG tablet Take 40 mg by mouth daily.    carbidopa-levodopa (SINEMET IR) 25-100 MG per tablet Take 0.5 tablets by mouth 3 (three) times daily.    diltiazem (CARDIZEM CD) 120 MG 24 hr capsule Take 1 capsule (120 mg total) by mouth daily. Qty: 30 capsule, Refills: 2    lisinopril (PRINIVIL,ZESTRIL) 20 MG tablet Take 20 mg by mouth daily.    Omega-3 Fatty Acids (FISH OIL) 1200 MG CAPS Take 2,400 mg by mouth daily.       No Known Allergies Follow-up Information    Follow up with Milagros Evener, MD In 2 weeks.   Specialty:  Family Medicine   Contact information:   [address]  [place] [address] [phone]        The results of significant diagnostics from this hospitalization (including imaging, microbiology, ancillary and laboratory) are listed below for reference.    Significant Diagnostic Studies: Dg Chest 1 View  08/04/2015   CLINICAL DATA:  Acute onset of shortness of breath earlier today. Current history of interstitial pulmonary fibrosis.  EXAM: CHEST  1 VIEW  COMPARISON:  09/16/2014 and earlier, including CTA chest 09/16/2014.  FINDINGS: Suboptimal inspiration. Cardiac silhouette mildly to moderately enlarged. Interval development of diffuse interstitial pulmonary opacities, superimposed upon the baseline changes of pulmonary fibrosis. No confluent airspace consolidation. Possible small bilateral pleural effusions. Note is made of calcification in the distal right supraspinatus tendon at its insertion on the greater tuberosity of the humerus.  IMPRESSION: CHF and/or fluid overload is suspected with mild diffuse interstitial pulmonary edema superimposed upon the baseline interstitial fibrosis.   Electronically Signed   By: Evangeline Dakin M.D.   On: 08/04/2015 12:12   Ct Head Wo Contrast  07/31/2015   CLINICAL DATA:  Dizziness.  Fall.  EXAM: CT HEAD WITHOUT CONTRAST   TECHNIQUE: Contiguous axial images were obtained from the base of the skull through the vertex without intravenous contrast.  COMPARISON:  CT 06/12/2015  FINDINGS: Stable generous cerebral atrophy.No intracranial hemorrhage, mass effect, or midline shift. No hydrocephalus. The basilar cisterns are patent. No evidence of territorial infarct. No intracranial fluid collection. Calvarium is intact. Included paranasal sinuses and mastoid air cells are well aerated.  IMPRESSION: No acute intracranial abnormality.   Electronically Signed   By: Jeb Levering M.D.   On: 07/31/2015 06:46   Dg Chest Port 1 View  08/05/2015   CLINICAL DATA:  Shortness of breath, fever  EXAM: PORTABLE CHEST - 1 VIEW  COMPARISON:  08/04/2015, 09/16/2014 ; CT chest 09/16/2014  FINDINGS: There is bilateral diffuse interstitial thickening. There is no pleural effusion or pneumothorax. There is no focal consolidation. The heart and mediastinal contours are unremarkable. There is thoracic aortic atherosclerosis.  No acute osseous abnormality. Mild chronic widening of the left AC joint.  IMPRESSION: Mild bilateral diffuse interstitial thickening likely reflecting underlying chronic interstitial lung disease. Mild superimposed infection or interstitial edema cannot be excluded.   Electronically Signed   By: Kathreen Devoid   On: 08/05/2015 11:59    Microbiology: Recent Results (from the past 240 hour(s))  Culture, blood (routine x 2)     Status: None (Preliminary result)   Collection Time: 08/05/15 12:21 PM  Result Value Ref Range Status   Specimen Description BLOOD LEFT ANTECUBITAL  Final   Special Requests   Final    BOTTLES DRAWN AEROBIC AND ANAEROBIC 10CC BOTH BOTTLES   Culture   Final    NO GROWTH 2 DAYS Performed at [facility]    Report Status PENDING  Incomplete  Culture, blood (routine x 2)     Status: None (Preliminary result)   Collection Time: 08/05/15 12:26 PM  Result Value Ref Range Status   Specimen  Description BLOOD RIGHT FOREARM  Final   Special Requests   Final    BOTTLES DRAWN AEROBIC AND ANAEROBIC 10CC BOTH BOTTLES   Culture   Final    NO GROWTH 2 DAYS Performed at [facility]    Report Status PENDING  Incomplete     Labs: Basic Metabolic Panel:  Recent Labs Lab 08/03/15 0452 08/04/15 1323 08/05/15 0605 08/06/15 0520 08/07/15 0807  NA 140 138 138 141 143  K 3.4* 3.8  3.0* 3.0* 3.7  CL 112* 106 103 110 112*  CO2 21* 19* 19* 18* 20*  GLUCOSE 104* 95 106* 117* 120*  BUN 9 9 14  22* 20  CREATININE 0.80 0.86 1.04 0.95 0.97  CALCIUM 7.5* 8.1* 7.7* 7.7* 8.0*   Liver Function Tests: No results for input(s): AST, ALT, ALKPHOS, BILITOT, PROT, ALBUMIN in the last 168 hours. No results for input(s): LIPASE, AMYLASE in the last 168 hours. No results for input(s): AMMONIA in the last 168 hours. CBC:  Recent Labs Lab 08/03/15 0452 08/04/15 1323 08/05/15 0605 08/06/15 0520 08/07/15 0807  WBC 3.8* 6.9 6.6 6.1 5.2  HGB 10.8* 12.5* 13.1 11.8* 12.3*  HCT 30.6* 36.9* 37.5* 33.9* 34.8*  MCV 103.0* 102.2* 100.3* 99.1 101.5*  PLT 100* 120* 149* 169 231   Cardiac Enzymes: No results for input(s): CKTOTAL, CKMB, CKMBINDEX, TROPONINI in the last 168 hours. BNP: BNP (last 3 results) No results for input(s): BNP in the last 8760 hours.  ProBNP (last 3 results)  Recent Labs  09/16/14 1431  PROBNP 2391.0*    CBG:  Recent Labs Lab 08/02/15 2119  GLUCAP 113*       Signed:  ZAMORA, EZEQUIEL  [facility] 08/08/2015, 8:49 AM

## 2015-08-10 ENCOUNTER — Non-Acute Institutional Stay (SKILLED_NURSING_FACILITY): Payer: Medicare Other | Admitting: Internal Medicine

## 2015-08-10 DIAGNOSIS — F10231 Alcohol dependence with withdrawal delirium: Secondary | ICD-10-CM | POA: Diagnosis not present

## 2015-08-10 DIAGNOSIS — G20 Parkinson's disease: Secondary | ICD-10-CM

## 2015-08-10 DIAGNOSIS — N3000 Acute cystitis without hematuria: Secondary | ICD-10-CM

## 2015-08-10 DIAGNOSIS — E440 Moderate protein-calorie malnutrition: Secondary | ICD-10-CM | POA: Diagnosis not present

## 2015-08-10 DIAGNOSIS — I951 Orthostatic hypotension: Secondary | ICD-10-CM

## 2015-08-10 DIAGNOSIS — I471 Supraventricular tachycardia: Secondary | ICD-10-CM | POA: Diagnosis not present

## 2015-08-10 DIAGNOSIS — F10931 Alcohol use, unspecified with withdrawal delirium: Secondary | ICD-10-CM

## 2015-08-10 DIAGNOSIS — I10 Essential (primary) hypertension: Secondary | ICD-10-CM

## 2015-08-10 LAB — CULTURE, BLOOD (ROUTINE X 2)
CULTURE: NO GROWTH
Culture: NO GROWTH

## 2015-08-17 ENCOUNTER — Non-Acute Institutional Stay (SKILLED_NURSING_FACILITY): Payer: Medicare Other | Admitting: Adult Health

## 2015-08-17 DIAGNOSIS — I951 Orthostatic hypotension: Secondary | ICD-10-CM | POA: Diagnosis not present

## 2015-08-17 DIAGNOSIS — I10 Essential (primary) hypertension: Secondary | ICD-10-CM

## 2015-08-24 ENCOUNTER — Encounter: Payer: Self-pay | Admitting: Internal Medicine

## 2015-09-12 ENCOUNTER — Encounter: Payer: Self-pay | Admitting: Adult Health

## 2015-09-12 MED ORDER — LISINOPRIL 5 MG PO TABS: 5.0000 mg | ORAL_TABLET | Freq: Every day | ORAL | Status: DC

## 2015-09-12 NOTE — Progress Notes (Signed)
Patient ID: Victor Clarke, male   DOB: 25-Mar-1929, 81 y.o.   MRN: [id number]    Facility: [facility]      No Known Allergies  Chief Complaint  Patient presents with  . Acute Visit    hypotension     HPI:  Staff reports that he is experiencing orthostatic hypotension. He denies any symptoms. He sates that he is feeling good. He is able to participate in therapy. He does have a history of this. I rechecked his orthostatic vital signs: 130/71lying; 116/73; sitting; 103/65 standing. He requires cueing not to start walking immediately upon standing.     Past Medical History  Diagnosis Date  . Hypertension   . Hypercholesteremia   . Gout   . COPD (chronic obstructive pulmonary disease) ([place])   . Detached retina     a. s/p surgical correction on the right.  . Prostate cancer ([place])     a. s/p TURP.  Marland Kitchen Ectopic atrial tachycardia ([place])     a. 08/2014 Echo: EF 55-60%, mildly dil LA.  . Parkinson's disease ([place])   . Left inguinal hernia     a. s/p repair ~ 10 yrs ago.    Past Surgical History  Procedure Laterality Date  . Colonoscopy w/ polypectomy  2001, 2006  . Hernia repair    . Prostate surgery      VITAL SIGNS BP 98/54 mmHg  Pulse 84  Ht '5\' 6"'  (1.676 m)  Wt 134 lb (60.782 kg)  BMI 21.64 kg/m2  SpO2 98%  Patient's Medications  New Prescriptions   No medications on file  Previous Medications   ALLOPURINOL (ZYLOPRIM) 100 MG TABLET    Take 100 mg by mouth daily.    ASPIRIN EC 81 MG TABLET    Take 81 mg by mouth daily.   ATORVASTATIN (LIPITOR) 40 MG TABLET    Take 40 mg by mouth daily.   CARBIDOPA-LEVODOPA (SINEMET IR) 25-100 MG PER TABLET    Take 0.5 tablets by mouth 3 (three) times daily.   DILTIAZEM (CARDIZEM CD) 120 MG 24 HR CAPSULE    Take 1 capsule (120 mg total) by mouth daily.   FEEDING SUPPLEMENT, ENSURE ENLIVE, (ENSURE ENLIVE) LIQD    Take 237 mLs by mouth 3 (three) times daily between meals.   LISINOPRIL (PRINIVIL,ZESTRIL) 20 MG TABLET     Take 20 mg by mouth daily.   OMEGA-3 FATTY ACIDS (FISH OIL) 1200 MG CAPS    Take 2,400 mg by mouth daily.  Modified Medications   No medications on file  Discontinued Medications     SIGNIFICANT DIAGNOSTIC EXAMS  07-31-15: ct of head: No acute intracranial abnormality  08-04-15: chest x-ray: CHF and/or fluid overload is suspected with mild diffuse interstitial pulmonary edema superimposed upon the baseline interstitial fibrosis.  08-05-15: chest x-ray: Mild bilateral diffuse interstitial thickening likely reflecting underlying chronic interstitial lung disease. Mild superimposed infection or interstitial edema cannot be excluded.   LABS REVIEWED:   07-31-15: wbc 5.3; hgb 13.9; hct 40.0; mcv 102.3; plt 123; glucose 89; bun 18; creat 1.05; k+ 4.0; na++143; ast 119; alk phos 37; albumin 4.1; tsh 1.610; ck: 418; ethanol: 280 08-02-15: wbc 4.8; hgb 12.0; hct 34.5; mcv 102.7; plt 125; glucose 103; bun 13; creat 0.82; k+ 3.3; na++139 08-05-15: wbc 6.6 ;hgb 13.1; hct 37.5; mcv 100.3; plt 149; glucose 106; bun 14; creat 1.04; k= 3.0; na++138 08-07-15: wbc 5.2; hgb 12.3; hct 34.8; mcv 101.5; plt 231; glucose 120; bun 20; creat  0.97; k+ 3.7; na++143         Review of Systems  Constitutional: Negative for appetite change and fatigue.  Respiratory: Negative for cough, chest tightness and shortness of breath.   Cardiovascular: Negative for chest pain, palpitations and leg swelling.  Gastrointestinal: Negative for nausea, abdominal pain, diarrhea and constipation.  Musculoskeletal: Negative for myalgias and arthralgias.  Skin: Negative for pallor.  Neurological: Negative for dizziness, syncope and light-headedness.  Psychiatric/Behavioral: The patient is not nervous/anxious.       Physical Exam  Constitutional: No distress.  Thin   Eyes: Conjunctivae are normal.  Neck: Neck supple. No JVD present. No thyromegaly present.  Cardiovascular: Normal rate, regular rhythm and intact distal pulses.     Respiratory: Effort normal and breath sounds normal. No respiratory distress. He has no wheezes.  GI: Soft. Bowel sounds are normal. He exhibits no distension. There is no tenderness.  Musculoskeletal: He exhibits no edema.  Able to move all extremities   Lymphadenopathy:    He has no cervical adenopathy.  Neurological: He is alert. No cranial nerve deficit.  Skin: Skin is warm and dry. He is not diaphoretic.  Psychiatric: He has a normal mood and affect.       ASSESSMENT/ PLAN:  1. Hypertension 2. Orthostatic hypotension Will lower his lisinopril to 5 mg daily and will check orthostatic vital signs daily     Time spent with patient  30  minutes >50% time spent counseling; reviewing medical record; tests; labs; and developing future plan of care   Ok Edwards NP [facility]  Contact [phone] Monday through Friday 8am- 5pm  After hours call [phone]

## 2015-09-13 ENCOUNTER — Encounter: Payer: Self-pay | Admitting: Adult Health

## 2015-09-13 ENCOUNTER — Non-Acute Institutional Stay (SKILLED_NURSING_FACILITY): Payer: Medicare Other | Admitting: Adult Health

## 2015-09-13 DIAGNOSIS — I4891 Unspecified atrial fibrillation: Secondary | ICD-10-CM | POA: Diagnosis not present

## 2015-09-13 DIAGNOSIS — G20 Parkinson's disease: Secondary | ICD-10-CM

## 2015-09-13 DIAGNOSIS — I951 Orthostatic hypotension: Secondary | ICD-10-CM

## 2015-09-13 NOTE — Progress Notes (Addendum)
Patient ID: Victor Clarke, male   DOB: 02/11/1929, 81 y.o.   MRN: [id number]    Facility: [facility]      No Known Allergies  Chief Complaint  Patient presents with  . Discharge Note    HPI:  He is being discharged to home with home health for pt/ot/st/ss. He will need a front wheel walker; cane; reacher; and hand held shower head. He will need his prescriptions to be written and will need to follow up with his pcp. He is considering moving to [place] within the next month.    Past Medical History  Diagnosis Date  . Hypertension   . Hypercholesteremia   . Gout   . COPD (chronic obstructive pulmonary disease) ([facility])   . Detached retina     a. s/p surgical correction on the right.  . Prostate cancer ([facility])     a. s/p TURP.  . Ectopic atrial tachycardia ([facility])     a. 08/2014 Echo: EF 55-60%, mildly dil LA.  . Parkinson's disease ([facility])   . Left inguinal hernia     a. s/p repair ~ 10 yrs ago.    Past Surgical History  Procedure Laterality Date  . Colonoscopy w/ polypectomy  2001, 2006  . Hernia repair    . Prostate surgery      VITAL SIGNS BP 129/61 mmHg  Pulse 67  Ht 5' 6" (1.676 m)  Wt 135 lb (61.236 kg)  BMI 21.80 kg/m2  Patient's Medications  New Prescriptions   No medications on file  Previous Medications   ALLOPURINOL (ZYLOPRIM) 100 MG TABLET    Take 100 mg by mouth daily.    ASPIRIN EC 81 MG TABLET    Take 81 mg by mouth daily.   ATORVASTATIN (LIPITOR) 40 MG TABLET    Take 40 mg by mouth daily.   CARBIDOPA-LEVODOPA (SINEMET IR) 25-100 MG PER TABLET    Take 0.5 tablets by mouth 3 (three) times daily.   DILTIAZEM (CARDIZEM CD) 120 MG 24 HR CAPSULE    Take 1 capsule (120 mg total) by mouth daily.   FEEDING SUPPLEMENT, ENSURE ENLIVE, (ENSURE ENLIVE) LIQD    Take 237 mLs by mouth 3 (three) times daily between meals.   LISINOPRIL (PRINIVIL,ZESTRIL) 5 MG TABLET    Take 1 tablet (5 mg total) by mouth daily.   OMEGA-3 FATTY ACIDS (FISH OIL) 1200 MG  CAPS    Take 2,400 mg by mouth daily.  Modified Medications   No medications on file  Discontinued Medications   No medications on file     SIGNIFICANT DIAGNOSTIC EXAMS   07-31-15: ct of head: No acute intracranial abnormality  08-04-15: chest x-ray: CHF and/or fluid overload is suspected with mild diffuse interstitial pulmonary edema superimposed upon the baseline interstitial fibrosis.  08-05-15: chest x-ray: Mild bilateral diffuse interstitial thickening likely reflecting underlying chronic interstitial lung disease. Mild superimposed infection or interstitial edema cannot be excluded.   LABS REVIEWED:   07-31-15: wbc 5.3; hgb 13.9; hct 40.0; mcv 102.3; plt 123; glucose 89; bun 18; creat 1.05; k+ 4.0; na++143; ast 119; alk phos 37; albumin 4.1; tsh 1.610; ck: 418; ethanol: 280 08-02-15: wbc 4.8; hgb 12.0; hct 34.5; mcv 102.7; plt 125; glucose 103; bun 13; creat 0.82; k+ 3.3; na++139 08-05-15: wbc 6.6 ;hgb 13.1; hct 37.5; mcv 100.3; plt 149; glucose 106; bun 14; creat 1.04; k= 3.0; na++138 08-07-15: wbc 5.2; hgb 12.3; hct 34.8; mcv 101.5; plt 231; glucose 120; bun 20; creat 0.97;   k+ 3.7; na++143      Review of Systems Constitutional: Negative for appetite change and fatigue.  Respiratory: Negative for cough, chest tightness and shortness of breath.   Cardiovascular: Negative for chest pain, palpitations and leg swelling.  Gastrointestinal: Negative for nausea, abdominal pain, diarrhea and constipation.  Musculoskeletal: Negative for myalgias and arthralgias.  Skin: Negative for pallor.  Neurological: Negative for dizziness, syncope and light-headedness.  Psychiatric/Behavioral: The patient is not nervous/anxious.       Physical Exam Constitutional: No distress.  Thin   Eyes: Conjunctivae are normal.  Neck: Neck supple. No JVD present. No thyromegaly present.  Cardiovascular: Normal rate, regular rhythm and intact distal pulses.   Respiratory: Effort normal and breath sounds  normal. No respiratory distress. He has no wheezes.  GI: Soft. Bowel sounds are normal. He exhibits no distension. There is no tenderness.  Musculoskeletal: He exhibits no edema.  Able to move all extremities   Lymphadenopathy:    He has no cervical adenopathy.  Neurological: He is alert. No cranial nerve deficit.  Skin: Skin is warm and dry. He is not diaphoretic.  Psychiatric: He has a normal mood and affect.     ASSESSMENT/ PLAN:  Will discharge to home with home health for pt/ot/st/rn/ss to evaluate and treat as indicated for gait; strength; adl training and speech defects; medication management; community services outreach. He will need a front wheel walker in order to allow him to maintain his current level of independence with his his adl's. He will need a reacher and a hand held shower head. His prescriptions have been written for a 30 day supply of his medications. The facility is aware to setup his follow up appointment with his pcp within the next 2 weeks post discharge.   Therapy does NOT recommend him to drive due to medical decline.    Time spent with patient  40   minutes >50% time spent counseling; reviewing medical record; tests; labs; and developing future plan of care   Ok Edwards NP [facility]  Contact [phone] Monday through Friday 8am- 5pm  After hours call [phone]

## 2015-09-25 ENCOUNTER — Telehealth: Payer: Self-pay | Admitting: Neurology

## 2015-09-25 NOTE — Telephone Encounter (Signed)
Pt sts his drivers license has been suspended, under the gun by an opinion rendered by rehab facility [facility]). He was been released last week after being there for about 1 mth. This facility has suspended his driving facilities. He sts they have labeled him with parkinsons disease which he strongly objects to. He is inquiring if he could get an appt with Dr Jaynee Eagles to help in this situation. Please call and advise

## 2015-09-26 NOTE — Telephone Encounter (Signed)
Spoke to patient.  I do agree he has parkinsonism. I also think he shouldn't be driving due to multiple reasons. Thanks.

## 2015-11-07 ENCOUNTER — Inpatient Hospital Stay (HOSPITAL_COMMUNITY)
Admission: EM | Admit: 2015-11-07 | Discharge: 2015-11-20 | DRG: 871 | Disposition: A | Discharge status: Skilled Nursing Facility | Payer: Medicare Other | Attending: Internal Medicine | Admitting: Internal Medicine

## 2015-11-07 ENCOUNTER — Emergency Department (HOSPITAL_COMMUNITY): Payer: Medicare Other

## 2015-11-07 ENCOUNTER — Encounter (HOSPITAL_COMMUNITY): Payer: Self-pay

## 2015-11-07 DIAGNOSIS — Z9289 Personal history of other medical treatment: Secondary | ICD-10-CM

## 2015-11-07 DIAGNOSIS — F10129 Alcohol abuse with intoxication, unspecified: Secondary | ICD-10-CM | POA: Diagnosis not present

## 2015-11-07 DIAGNOSIS — Z4659 Encounter for fitting and adjustment of other gastrointestinal appliance and device: Secondary | ICD-10-CM

## 2015-11-07 DIAGNOSIS — E440 Moderate protein-calorie malnutrition: Secondary | ICD-10-CM | POA: Diagnosis present

## 2015-11-07 DIAGNOSIS — Z452 Encounter for adjustment and management of vascular access device: Secondary | ICD-10-CM

## 2015-11-07 DIAGNOSIS — J9600 Acute respiratory failure, unspecified whether with hypoxia or hypercapnia: Secondary | ICD-10-CM

## 2015-11-07 DIAGNOSIS — G20 Parkinson's disease: Secondary | ICD-10-CM | POA: Diagnosis present

## 2015-11-07 DIAGNOSIS — E872 Acidosis, unspecified: Secondary | ICD-10-CM | POA: Diagnosis present

## 2015-11-07 DIAGNOSIS — Z66 Do not resuscitate: Secondary | ICD-10-CM | POA: Diagnosis not present

## 2015-11-07 DIAGNOSIS — R571 Hypovolemic shock: Secondary | ICD-10-CM | POA: Diagnosis not present

## 2015-11-07 DIAGNOSIS — E876 Hypokalemia: Secondary | ICD-10-CM | POA: Diagnosis not present

## 2015-11-07 DIAGNOSIS — A4159 Other Gram-negative sepsis: Secondary | ICD-10-CM | POA: Diagnosis not present

## 2015-11-07 DIAGNOSIS — S022XXA Fracture of nasal bones, initial encounter for closed fracture: Secondary | ICD-10-CM | POA: Diagnosis present

## 2015-11-07 DIAGNOSIS — Y92009 Unspecified place in unspecified non-institutional (private) residence as the place of occurrence of the external cause: Secondary | ICD-10-CM | POA: Diagnosis not present

## 2015-11-07 DIAGNOSIS — Z79899 Other long term (current) drug therapy: Secondary | ICD-10-CM

## 2015-11-07 DIAGNOSIS — R4182 Altered mental status, unspecified: Secondary | ICD-10-CM | POA: Diagnosis present

## 2015-11-07 DIAGNOSIS — Y908 Blood alcohol level of 240 mg/100 ml or more: Secondary | ICD-10-CM | POA: Diagnosis not present

## 2015-11-07 DIAGNOSIS — N390 Urinary tract infection, site not specified: Secondary | ICD-10-CM | POA: Diagnosis present

## 2015-11-07 DIAGNOSIS — J9601 Acute respiratory failure with hypoxia: Secondary | ICD-10-CM | POA: Insufficient documentation

## 2015-11-07 DIAGNOSIS — S41111A Laceration without foreign body of right upper arm, initial encounter: Secondary | ICD-10-CM | POA: Diagnosis present

## 2015-11-07 DIAGNOSIS — S41112A Laceration without foreign body of left upper arm, initial encounter: Secondary | ICD-10-CM | POA: Diagnosis present

## 2015-11-07 DIAGNOSIS — W109XXA Fall (on) (from) unspecified stairs and steps, initial encounter: Secondary | ICD-10-CM | POA: Diagnosis present

## 2015-11-07 DIAGNOSIS — F1020 Alcohol dependence, uncomplicated: Secondary | ICD-10-CM | POA: Diagnosis present

## 2015-11-07 DIAGNOSIS — R1312 Dysphagia, oropharyngeal phase: Secondary | ICD-10-CM | POA: Diagnosis present

## 2015-11-07 DIAGNOSIS — F10239 Alcohol dependence with withdrawal, unspecified: Secondary | ICD-10-CM | POA: Diagnosis not present

## 2015-11-07 DIAGNOSIS — Z22322 Carrier or suspected carrier of Methicillin resistant Staphylococcus aureus: Secondary | ICD-10-CM | POA: Diagnosis not present

## 2015-11-07 DIAGNOSIS — J9602 Acute respiratory failure with hypercapnia: Secondary | ICD-10-CM | POA: Diagnosis not present

## 2015-11-07 DIAGNOSIS — D6959 Other secondary thrombocytopenia: Secondary | ICD-10-CM | POA: Diagnosis present

## 2015-11-07 DIAGNOSIS — E7800 Pure hypercholesterolemia, unspecified: Secondary | ICD-10-CM

## 2015-11-07 DIAGNOSIS — E785 Hyperlipidemia, unspecified: Secondary | ICD-10-CM | POA: Diagnosis not present

## 2015-11-07 DIAGNOSIS — J449 Chronic obstructive pulmonary disease, unspecified: Secondary | ICD-10-CM | POA: Diagnosis not present

## 2015-11-07 DIAGNOSIS — Z8546 Personal history of malignant neoplasm of prostate: Secondary | ICD-10-CM

## 2015-11-07 DIAGNOSIS — R509 Fever, unspecified: Secondary | ICD-10-CM

## 2015-11-07 DIAGNOSIS — Z9181 History of falling: Secondary | ICD-10-CM

## 2015-11-07 DIAGNOSIS — F10929 Alcohol use, unspecified with intoxication, unspecified: Secondary | ICD-10-CM | POA: Insufficient documentation

## 2015-11-07 DIAGNOSIS — Z87891 Personal history of nicotine dependence: Secondary | ICD-10-CM | POA: Diagnosis not present

## 2015-11-07 DIAGNOSIS — S0181XA Laceration without foreign body of other part of head, initial encounter: Secondary | ICD-10-CM | POA: Diagnosis present

## 2015-11-07 DIAGNOSIS — Z7982 Long term (current) use of aspirin: Secondary | ICD-10-CM | POA: Diagnosis not present

## 2015-11-07 DIAGNOSIS — R8281 Pyuria: Secondary | ICD-10-CM

## 2015-11-07 DIAGNOSIS — I10 Essential (primary) hypertension: Secondary | ICD-10-CM | POA: Diagnosis present

## 2015-11-07 DIAGNOSIS — W108XXA Fall (on) (from) other stairs and steps, initial encounter: Secondary | ICD-10-CM | POA: Diagnosis not present

## 2015-11-07 DIAGNOSIS — M109 Gout, unspecified: Secondary | ICD-10-CM | POA: Diagnosis not present

## 2015-11-07 DIAGNOSIS — D696 Thrombocytopenia, unspecified: Secondary | ICD-10-CM | POA: Diagnosis present

## 2015-11-07 DIAGNOSIS — F10920 Alcohol use, unspecified with intoxication, uncomplicated: Secondary | ICD-10-CM

## 2015-11-07 DIAGNOSIS — D649 Anemia, unspecified: Secondary | ICD-10-CM | POA: Diagnosis not present

## 2015-11-07 DIAGNOSIS — F0390 Unspecified dementia without behavioral disturbance: Secondary | ICD-10-CM | POA: Diagnosis not present

## 2015-11-07 DIAGNOSIS — G9340 Encephalopathy, unspecified: Secondary | ICD-10-CM | POA: Diagnosis present

## 2015-11-07 DIAGNOSIS — Z6820 Body mass index (BMI) 20.0-20.9, adult: Secondary | ICD-10-CM

## 2015-11-07 DIAGNOSIS — G9341 Metabolic encephalopathy: Secondary | ICD-10-CM | POA: Diagnosis not present

## 2015-11-07 DIAGNOSIS — J9690 Respiratory failure, unspecified, unspecified whether with hypoxia or hypercapnia: Secondary | ICD-10-CM

## 2015-11-07 DIAGNOSIS — Z01818 Encounter for other preprocedural examination: Secondary | ICD-10-CM

## 2015-11-07 LAB — URINALYSIS, ROUTINE W REFLEX MICROSCOPIC
Bilirubin Urine: NEGATIVE
Glucose, UA: NEGATIVE mg/dL
Ketones, ur: NEGATIVE mg/dL
NITRITE: NEGATIVE
PROTEIN: NEGATIVE mg/dL
SPECIFIC GRAVITY, URINE: 1.007 (ref 1.005–1.030)
pH: 6 (ref 5.0–8.0)

## 2015-11-07 LAB — CBC WITH DIFFERENTIAL/PLATELET
BASOS ABS: 0 10*3/uL (ref 0.0–0.1)
Basophils Relative: 0 %
EOS PCT: 4 %
Eosinophils Absolute: 0.2 10*3/uL (ref 0.0–0.7)
HEMATOCRIT: 34.4 % — AB (ref 39.0–52.0)
Hemoglobin: 11.9 g/dL — ABNORMAL LOW (ref 13.0–17.0)
LYMPHS PCT: 19 %
Lymphs Abs: 0.9 10*3/uL (ref 0.7–4.0)
MCH: 33.3 pg (ref 26.0–34.0)
MCHC: 34.6 g/dL (ref 30.0–36.0)
MCV: 96.4 fL (ref 78.0–100.0)
MONO ABS: 0.3 10*3/uL (ref 0.1–1.0)
MONOS PCT: 7 %
NEUTROS ABS: 3.3 10*3/uL (ref 1.7–7.7)
Neutrophils Relative %: 70 %
PLATELETS: 105 10*3/uL — AB (ref 150–400)
RBC: 3.57 MIL/uL — ABNORMAL LOW (ref 4.22–5.81)
RDW: 18.1 % — AB (ref 11.5–15.5)
WBC: 4.8 10*3/uL (ref 4.0–10.5)

## 2015-11-07 LAB — I-STAT CHEM 8, ED
BUN: 22 mg/dL — ABNORMAL HIGH (ref 6–20)
CALCIUM ION: 1.09 mmol/L — AB (ref 1.13–1.30)
Chloride: 107 mmol/L (ref 101–111)
Creatinine, Ser: 1.1 mg/dL (ref 0.61–1.24)
GLUCOSE: 110 mg/dL — AB (ref 65–99)
HCT: 38 % — ABNORMAL LOW (ref 39.0–52.0)
HEMOGLOBIN: 12.9 g/dL — AB (ref 13.0–17.0)
POTASSIUM: 3.8 mmol/L (ref 3.5–5.1)
SODIUM: 145 mmol/L (ref 135–145)
TCO2: 22 mmol/L (ref 0–100)

## 2015-11-07 LAB — I-STAT TROPONIN, ED: Troponin i, poc: 0.02 ng/mL (ref 0.00–0.08)

## 2015-11-07 LAB — COMPREHENSIVE METABOLIC PANEL
ALT: 45 U/L (ref 17–63)
ANION GAP: 13 (ref 5–15)
AST: 76 U/L — AB (ref 15–41)
Albumin: 3.5 g/dL (ref 3.5–5.0)
Alkaline Phosphatase: 65 U/L (ref 38–126)
BILIRUBIN TOTAL: 0.4 mg/dL (ref 0.3–1.2)
BUN: 19 mg/dL (ref 6–20)
CHLORIDE: 110 mmol/L (ref 101–111)
CO2: 21 mmol/L — ABNORMAL LOW (ref 22–32)
Calcium: 8.7 mg/dL — ABNORMAL LOW (ref 8.9–10.3)
Creatinine, Ser: 0.84 mg/dL (ref 0.61–1.24)
Glucose, Bld: 115 mg/dL — ABNORMAL HIGH (ref 65–99)
POTASSIUM: 3.9 mmol/L (ref 3.5–5.1)
Sodium: 144 mmol/L (ref 135–145)
TOTAL PROTEIN: 6.4 g/dL — AB (ref 6.5–8.1)

## 2015-11-07 LAB — RAPID URINE DRUG SCREEN, HOSP PERFORMED
AMPHETAMINES: NOT DETECTED
Barbiturates: NOT DETECTED
Benzodiazepines: NOT DETECTED
Cocaine: NOT DETECTED
OPIATES: NOT DETECTED
Tetrahydrocannabinol: NOT DETECTED

## 2015-11-07 LAB — URINE MICROSCOPIC-ADD ON

## 2015-11-07 LAB — CBG MONITORING, ED: Glucose-Capillary: 101 mg/dL — ABNORMAL HIGH (ref 65–99)

## 2015-11-07 LAB — CK: Total CK: 268 U/L (ref 49–397)

## 2015-11-07 LAB — I-STAT CG4 LACTIC ACID, ED: LACTIC ACID, VENOUS: 3.41 mmol/L — AB (ref 0.5–2.0)

## 2015-11-07 LAB — ETHANOL: ALCOHOL ETHYL (B): 259 mg/dL — AB (ref <5)

## 2015-11-07 LAB — AMMONIA: AMMONIA: 29 umol/L (ref 9–35)

## 2015-11-07 MED ORDER — LIDOCAINE HCL (PF) 1 % IJ SOLN
30.0000 mL | Freq: Once | INTRAMUSCULAR | Status: AC
  Administered 2015-11-07: 30 mL via INTRADERMAL
  Filled 2015-11-07: qty 30

## 2015-11-07 MED ORDER — CALCIUM GLUCONATE 10 % IV SOLN
2.0000 g | Freq: Once | INTRAVENOUS | Status: AC
Start: 2015-11-07 — End: 2015-11-08
  Administered 2015-11-07: 2 g via INTRAVENOUS
  Filled 2015-11-07: qty 20

## 2015-11-07 MED ORDER — LIDOCAINE-EPINEPHRINE (PF) 2 %-1:200000 IJ SOLN: 20.0000 mL | Freq: Once | INTRAMUSCULAR | Status: DC

## 2015-11-07 MED ORDER — SODIUM CHLORIDE 0.9 % IV BOLUS (SEPSIS)
1000.0000 mL | Freq: Once | INTRAVENOUS | Status: AC
  Administered 2015-11-07: 1000 mL via INTRAVENOUS

## 2015-11-07 MED ORDER — DEXTROSE 5 % IV SOLN
1.0000 g | Freq: Once | INTRAVENOUS | Status: AC
  Administered 2015-11-07: 1 g via INTRAVENOUS
  Filled 2015-11-07: qty 10

## 2015-11-07 MED ORDER — TETANUS-DIPHTH-ACELL PERTUSSIS 5-2.5-18.5 LF-MCG/0.5 IM SUSP
0.5000 mL | Freq: Once | INTRAMUSCULAR | Status: AC
  Administered 2015-11-07: 0.5 mL via INTRAMUSCULAR
  Filled 2015-11-07: qty 0.5

## 2015-11-07 NOTE — ED Notes (Signed)
Cleaned pt's wounds and dried blood with a water and peroxide mixer, per Vernie Shanks - PA

## 2015-11-07 NOTE — ED Notes (Signed)
Pt's yellow colored ring placed in a urine cup, labeled and placed in a personal belonging bag.

## 2015-11-07 NOTE — ED Notes (Signed)
Dr. Smith at bedside.

## 2015-11-07 NOTE — H&P (Signed)
[facility] History and Physical  SANJEEV RENNA [id number] DOB: 05/21/1929 DOA: 11/07/2015  Referring physician: ED PCP: Aretta Nip, MD   Chief Complaint: Fall  HPI:  Mr. Rapozo is a 81 year old male with past medical history significant for alcohol abuse, HTN, HLD, gout, COPD, and Parkinson's disease; who presents after falling down the steps of his home and being found down for some unknown time. Also patient history is obtained through review of chart as patient is still altered and acutely intoxicated. Patient was confused and suffered a lacerations to the front of his forehead and arms. Patient reports drinking martinis. Patient does not recall the period in time prior to falling down the steps. He currently denies shortness of breath or chest pain.  Upon admission into the emergency department patient was evaluated with a CT of the head and neck which showed no acute abnormalities. Initial blood work showed alcohol level 256, lactic acidosis of 3.41 plt 105, and Hbg 11.9. UA was positive for bacteria.    Review of Systems  Constitutional: Negative for chills and weight loss.  HENT: Negative for ear pain.   Eyes: Positive for pain. Negative for discharge.  Respiratory: Negative for hemoptysis and sputum production.   Cardiovascular: Negative for palpitations and orthopnea.  Gastrointestinal: Negative for abdominal pain and diarrhea.  Genitourinary: Negative for urgency and frequency.  Musculoskeletal: Positive for back pain and falls.  Skin: Negative for itching and rash.       Abrasions to face and arms  Neurological: Positive for tremors, loss of consciousness and headaches. Negative for sensory change and focal weakness.  Endo/Heme/Allergies: Bruises/bleeds easily.  Psychiatric/Behavioral: Positive for substance abuse. Negative for memory loss.        Past Medical History  Diagnosis Date  . Hypertension   . Hypercholesteremia   . Gout   . COPD  (chronic obstructive pulmonary disease) ([place])   . Detached retina     a. s/p surgical correction on the right.  . Prostate cancer ([place])     a. s/p TURP.  Marland Kitchen Ectopic atrial tachycardia ([place])     a. 08/2014 Echo: EF 55-60%, mildly dil LA.  . Parkinson's disease ([place])   . Left inguinal hernia     a. s/p repair ~ 10 yrs ago.     Past Surgical History  Procedure Laterality Date  . Colonoscopy w/ polypectomy  2001, 2006  . Hernia repair    . Prostate surgery        Social History:  reports that he quit smoking about 37 years ago. His smoking use included Cigarettes. He has a 44 pack-year smoking history. He has never used smokeless tobacco. He reports that he drinks about 0.6 oz of alcohol per week. He reports that he does not use illicit drugs. Where does patient live--home and with whom if at home? Alone  Can patient participate in ADLs? Yes  No Known Allergies  Family History  Problem Relation Age of Onset  . Stroke Father     died @ 105.  Marland Kitchen Heart attack Mother     died @ 31  . Stroke Brother     died in his 63's  . Heart attack Brother     died in his 72's        Prior to Admission medications   Medication Sig Start Date End Date Taking? Authorizing Provider  allopurinol (ZYLOPRIM) 100 MG tablet Take 100 mg by mouth daily.    Yes Historical Provider, MD  aspirin EC 81 MG tablet Take 81 mg by mouth daily.   Yes Historical Provider, MD  atorvastatin (LIPITOR) 40 MG tablet Take 40 mg by mouth daily.   Yes Historical Provider, MD  carbidopa-levodopa (SINEMET IR) 25-100 MG per tablet Take 0.5 tablets by mouth 3 (three) times daily.   Yes Historical Provider, MD  diltiazem (CARDIZEM CD) 120 MG 24 hr capsule Take 1 capsule (120 mg total) by mouth daily. 09/29/14  Yes Rogelia Mire, NP  lisinopril (PRINIVIL,ZESTRIL) 5 MG tablet Take 1 tablet (5 mg total) by mouth daily. 09/12/15  Yes Gerlene Fee, NP  meclizine (ANTIVERT) 25 MG tablet Take 12.5 mg by mouth daily.   Yes  Historical Provider, MD  Omega-3 Fatty Acids (FISH OIL) 1200 MG CAPS Take 2,400 mg by mouth daily.   Yes Historical Provider, MD  feeding supplement, ENSURE ENLIVE, (ENSURE ENLIVE) LIQD Take 237 mLs by mouth 3 (three) times daily between meals. 08/08/15   Kelvin Cellar, MD     Physical Exam: Filed Vitals:   11/07/15 2045 11/07/15 2100 11/07/15 2105 11/07/15 2115  BP: 149/87 155/76 149/85 146/83  Pulse: 71 75 76 69  Temp:      TempSrc:      Resp: 17 15 12 20   SpO2: 98% 97%  96%     Constitutional: Vital signs reviewed. Patient is disheveled  and confused stating that he thinks he is at his home Head: Normocephalic and atraumatic  Ear: TM normal bilaterally  Mouth: no erythema or exudates, MMM  Eyes: PERRL, EOMI, conjunctivae normal, No scleral icterus. Patient with significant periorbital edema of the left >right eyelid. Neck: Supple, Trachea midline normal ROM, No JVD, mass, thyromegaly, or carotid bruit present.  Cardiovascular: RRR, S1 normal, S2 normal, no MRG, pulses symmetric and intact bilaterally  Pulmonary/Chest: CTAB, no wheezes, rales, or rhonchi  Abdominal: Soft. Non-tender, non-distended, bowel sounds are normal, no masses, organomegaly, or guarding present.  GU: no CVA tenderness Musculoskeletal: No joint deformities, erythema, or stiffness, ROM full and no nontender Ext: no edema and no cyanosis, pulses palpable bilaterally (DP and PT)  Hematology: no cervical, inginal, or axillary adenopathy.  Neurological: A&O x1, patient with intermittent jerks and appears that the left hand is somewhat contracted, but appears able to move all extremities Skin: Multiple skin tears to the arms and a laceration to the patient's forehead requiring stitches that is approximately 5 cm in length  Psychiatric: Normal mood and affect. speech and behavior is normal. Judgment and thought content normal. Cognition and memory are normal.      Data Review   Micro Results No results found  for this or any previous visit (from the past 240 hour(s)).  Radiology Reports Ct Head Wo Contrast  11/07/2015  CLINICAL DATA:  Passed out today while cooking, now with cut on the nose. EXAM: CT HEAD WITHOUT CONTRAST CT MAXILLOFACIAL WITHOUT CONTRAST CT CERVICAL SPINE WITHOUT CONTRAST TECHNIQUE: Multidetector CT imaging of the head, cervical spine, and maxillofacial structures were performed using the standard protocol without intravenous contrast. Multiplanar CT image reconstructions of the cervical spine and maxillofacial structures were also generated. COMPARISON:  Head CT - 07/31/2015; 06/12/2015; 12/28/2014 FINDINGS: CT HEAD FINDINGS Similar findings of advanced atrophy with sulcal prominence centralized volume loss with commensurate ex vacuo dilatation of the ventricular system. Scattered periventricular hypodensities compatible microvascular ischemic disease. Bilateral basal ganglial calcifications. Given background parenchymal abnormalities, there is no CT evidence of superimposed acute large territory infarct. No intraparenchymal or extra-axial mass  or hemorrhage. Unchanged size and configuration of the ventricles and basilar cisterns. No midline shift. CT MAXILLOFACIAL FINDINGS Evaluation of the facial bones is degraded secondary to patient motion artifact. There is mild soft tissue swelling about the bridge of the nose with potential mildly displaced nasal bone fracture (image 67, series 5). Otherwise, no definite displaced facial fractures given limitation of patient motion. Normal appearance of the bilateral pterygoid plates. Normal appearance of the mandible. The bilateral mandibular condyles are normally located. Normal appearance of the bilateral zygomatic arches. Normal noncontrast appearance of the bilateral orbits and globes. Post left-sided cataract surgery. There is mild rightward nasal septal deviation. There is minimal mucosal thickening within the left maxillary sinus. The remaining  paranasal sinuses and mastoid air cells are normally aerated. No air-fluid levels. CT CERVICAL SPINE FINDINGS Evaluation of cervical spine is minimally degraded secondary to patient motion artifact. C1 to the superior endplate of T1 is imaged. There is mild (approximately 3 mm) of presumably degenerative retrolisthesis of C4 upon C5. Otherwise, normal alignment of the cervical spine. No anterolisthesis or retrolisthesis. The bilateral facets are normally aligned. The dens is normally positioned between the lateral masses of C1. Normal atlantodental and atlantoaxial articulations. No fracture or static subluxation of the cervical spine. Cervical vertebral and heights are preserved. Prevertebral soft tissues are normal. Moderate to severe multilevel cervical spine DDD, likely worse at C4-C5 and to a lesser extent, C3-C4 and C5-C6 with disc space height loss, endplate irregularity and sclerosis. There is a suspected partial ankylosis involving the C4-C5 and C5-C6 intervertebral disc spaces. Atherosclerotic plaque within the bilateral carotid bulbs. No bulky cervical lymphadenopathy on this noncontrast examination. Limited visualization lung apices demonstrates biapical bullous emphysematous change, right greater than left. IMPRESSION: 1. Advanced atrophy and microvascular ischemic disease without acute intracranial process. 2. Suspected minimally displaced nasal bone fracture. No additional facial fractures identified on this motion degraded examination. 3. Motion degraded examination without evidence of fracture or static subluxation of the cervical spine. 4. Moderate to severe multilevel cervical spine DDD. Electronically Signed   By: Sandi Mariscal M.D.   On: 11/07/2015 17:22   Ct Cervical Spine Wo Contrast  11/07/2015  CLINICAL DATA:  Passed out today while cooking, now with cut on the nose. EXAM: CT HEAD WITHOUT CONTRAST CT MAXILLOFACIAL WITHOUT CONTRAST CT CERVICAL SPINE WITHOUT CONTRAST TECHNIQUE: Multidetector  CT imaging of the head, cervical spine, and maxillofacial structures were performed using the standard protocol without intravenous contrast. Multiplanar CT image reconstructions of the cervical spine and maxillofacial structures were also generated. COMPARISON:  Head CT - 07/31/2015; 06/12/2015; 12/28/2014 FINDINGS: CT HEAD FINDINGS Similar findings of advanced atrophy with sulcal prominence centralized volume loss with commensurate ex vacuo dilatation of the ventricular system. Scattered periventricular hypodensities compatible microvascular ischemic disease. Bilateral basal ganglial calcifications. Given background parenchymal abnormalities, there is no CT evidence of superimposed acute large territory infarct. No intraparenchymal or extra-axial mass or hemorrhage. Unchanged size and configuration of the ventricles and basilar cisterns. No midline shift. CT MAXILLOFACIAL FINDINGS Evaluation of the facial bones is degraded secondary to patient motion artifact. There is mild soft tissue swelling about the bridge of the nose with potential mildly displaced nasal bone fracture (image 67, series 5). Otherwise, no definite displaced facial fractures given limitation of patient motion. Normal appearance of the bilateral pterygoid plates. Normal appearance of the mandible. The bilateral mandibular condyles are normally located. Normal appearance of the bilateral zygomatic arches. Normal noncontrast appearance of the bilateral orbits and globes. Post  left-sided cataract surgery. There is mild rightward nasal septal deviation. There is minimal mucosal thickening within the left maxillary sinus. The remaining paranasal sinuses and mastoid air cells are normally aerated. No air-fluid levels. CT CERVICAL SPINE FINDINGS Evaluation of cervical spine is minimally degraded secondary to patient motion artifact. C1 to the superior endplate of T1 is imaged. There is mild (approximately 3 mm) of presumably degenerative retrolisthesis  of C4 upon C5. Otherwise, normal alignment of the cervical spine. No anterolisthesis or retrolisthesis. The bilateral facets are normally aligned. The dens is normally positioned between the lateral masses of C1. Normal atlantodental and atlantoaxial articulations. No fracture or static subluxation of the cervical spine. Cervical vertebral and heights are preserved. Prevertebral soft tissues are normal. Moderate to severe multilevel cervical spine DDD, likely worse at C4-C5 and to a lesser extent, C3-C4 and C5-C6 with disc space height loss, endplate irregularity and sclerosis. There is a suspected partial ankylosis involving the C4-C5 and C5-C6 intervertebral disc spaces. Atherosclerotic plaque within the bilateral carotid bulbs. No bulky cervical lymphadenopathy on this noncontrast examination. Limited visualization lung apices demonstrates biapical bullous emphysematous change, right greater than left. IMPRESSION: 1. Advanced atrophy and microvascular ischemic disease without acute intracranial process. 2. Suspected minimally displaced nasal bone fracture. No additional facial fractures identified on this motion degraded examination. 3. Motion degraded examination without evidence of fracture or static subluxation of the cervical spine. 4. Moderate to severe multilevel cervical spine DDD. Electronically Signed   By: Sandi Mariscal M.D.   On: 11/07/2015 17:22   Ct Maxillofacial Wo Cm  11/07/2015  CLINICAL DATA:  Passed out today while cooking, now with cut on the nose. EXAM: CT HEAD WITHOUT CONTRAST CT MAXILLOFACIAL WITHOUT CONTRAST CT CERVICAL SPINE WITHOUT CONTRAST TECHNIQUE: Multidetector CT imaging of the head, cervical spine, and maxillofacial structures were performed using the standard protocol without intravenous contrast. Multiplanar CT image reconstructions of the cervical spine and maxillofacial structures were also generated. COMPARISON:  Head CT - 07/31/2015; 06/12/2015; 12/28/2014 FINDINGS: CT HEAD  FINDINGS Similar findings of advanced atrophy with sulcal prominence centralized volume loss with commensurate ex vacuo dilatation of the ventricular system. Scattered periventricular hypodensities compatible microvascular ischemic disease. Bilateral basal ganglial calcifications. Given background parenchymal abnormalities, there is no CT evidence of superimposed acute large territory infarct. No intraparenchymal or extra-axial mass or hemorrhage. Unchanged size and configuration of the ventricles and basilar cisterns. No midline shift. CT MAXILLOFACIAL FINDINGS Evaluation of the facial bones is degraded secondary to patient motion artifact. There is mild soft tissue swelling about the bridge of the nose with potential mildly displaced nasal bone fracture (image 67, series 5). Otherwise, no definite displaced facial fractures given limitation of patient motion. Normal appearance of the bilateral pterygoid plates. Normal appearance of the mandible. The bilateral mandibular condyles are normally located. Normal appearance of the bilateral zygomatic arches. Normal noncontrast appearance of the bilateral orbits and globes. Post left-sided cataract surgery. There is mild rightward nasal septal deviation. There is minimal mucosal thickening within the left maxillary sinus. The remaining paranasal sinuses and mastoid air cells are normally aerated. No air-fluid levels. CT CERVICAL SPINE FINDINGS Evaluation of cervical spine is minimally degraded secondary to patient motion artifact. C1 to the superior endplate of T1 is imaged. There is mild (approximately 3 mm) of presumably degenerative retrolisthesis of C4 upon C5. Otherwise, normal alignment of the cervical spine. No anterolisthesis or retrolisthesis. The bilateral facets are normally aligned. The dens is normally positioned between the lateral masses of C1.  Normal atlantodental and atlantoaxial articulations. No fracture or static subluxation of the cervical spine.  Cervical vertebral and heights are preserved. Prevertebral soft tissues are normal. Moderate to severe multilevel cervical spine DDD, likely worse at C4-C5 and to a lesser extent, C3-C4 and C5-C6 with disc space height loss, endplate irregularity and sclerosis. There is a suspected partial ankylosis involving the C4-C5 and C5-C6 intervertebral disc spaces. Atherosclerotic plaque within the bilateral carotid bulbs. No bulky cervical lymphadenopathy on this noncontrast examination. Limited visualization lung apices demonstrates biapical bullous emphysematous change, right greater than left. IMPRESSION: 1. Advanced atrophy and microvascular ischemic disease without acute intracranial process. 2. Suspected minimally displaced nasal bone fracture. No additional facial fractures identified on this motion degraded examination. 3. Motion degraded examination without evidence of fracture or static subluxation of the cervical spine. 4. Moderate to severe multilevel cervical spine DDD. Electronically Signed   By: Sandi Mariscal M.D.   On: 11/07/2015 17:22     CBC  Recent Labs Lab 11/07/15 1617 11/07/15 1626  WBC 4.8  --   HGB 11.9* 12.9*  HCT 34.4* 38.0*  PLT 105*  --   MCV 96.4  --   MCH 33.3  --   MCHC 34.6  --   RDW 18.1*  --   LYMPHSABS 0.9  --   MONOABS 0.3  --   EOSABS 0.2  --   BASOSABS 0.0  --     Chemistries   Recent Labs Lab 11/07/15 1617 11/07/15 1626  NA 144 145  K 3.9 3.8  CL 110 107  CO2 21*  --   GLUCOSE 115* 110*  BUN 19 22*  CREATININE 0.84 1.10  CALCIUM 8.7*  --   AST 76*  --   ALT 45  --   ALKPHOS 65  --   BILITOT 0.4  --    ------------------------------------------------------------------------------------------------------------------ CrCl cannot be calculated (Unknown ideal weight.). ------------------------------------------------------------------------------------------------------------------ No results for input(s): HGBA1C in the last 72  hours. ------------------------------------------------------------------------------------------------------------------ No results for input(s): CHOL, HDL, LDLCALC, TRIG, CHOLHDL, LDLDIRECT in the last 72 hours. ------------------------------------------------------------------------------------------------------------------ No results for input(s): TSH, T4TOTAL, T3FREE, THYROIDAB in the last 72 hours.  Invalid input(s): FREET3 ------------------------------------------------------------------------------------------------------------------ No results for input(s): VITAMINB12, FOLATE, FERRITIN, TIBC, IRON, RETICCTPCT in the last 72 hours.  Coagulation profile No results for input(s): INR, PROTIME in the last 168 hours.  No results for input(s): DDIMER in the last 72 hours.  Cardiac Enzymes No results for input(s): CKMB, TROPONINI, MYOGLOBIN in the last 168 hours.  Invalid input(s): CK ------------------------------------------------------------------------------------------------------------------ Invalid input(s): POCBNP   CBG:  Recent Labs Lab 11/07/15 1618  GLUCAP 101*       EKG: Independently reviewed. Sinus rhythm with a left anterior fascicular block unchanged from previous tracings.   Assessment/Plan Principal Problem:  Acute encephalopathy: Patient presents after suffering a fall down his steps at home and being down for some unknown period of time. Upon admission patient confused. CT of the head and spine negative for any acute abnormalities. Patient found to have elevated blood alcohol level. - Admit to MedSurg - Set bed alarm as patient has a high fall risk -  neuro checks  Alcohol abuse with intoxication: Patient's blood alcohol level 250 on admission. Patient with reported admissions with elevated alcohol levels. - Started patient on banana bag at 75 mL per hour - CWIAA protocols without scheduled Ativan - Social work/care management consults     Lacerations and abrasions: Patient was given a Tdap booster in the ED along with stitches  for the forehead laceration. - Wound care consult    Lactic acidosis: Initial lactic acid level 3.41 on admission.  -  trend lactic acid levels  Pyuria with Suspected urinary tract infection. UA positive for small leukocyte esterase, nitrate negative, many bacteria, with 6-30 white blood cells. Patient given a one-time dose of Rocephin and emergency department. - Urine culture ordered follow-up results - Continue Rocephin   Hypertension -Continue lisinopril    Hypercholesteremia - Continue atorvastatin and fish oil supplements daily     Fall from steps: Patient suffering multiple lacerations. Given Tdap in ED - Physical therapy to eval and treat   Anemia -Continue to monitor    Thrombocytopenia (HCC) platelet count 105 patient with history of alcoholism   History of Parkinson's disease - Continue Sinemet IR  Hypocalcemia: Acute. Patient's initial calcium level VIII.7 ionized calcium checked and also low at 1.09. - 2 g of calcium gluconate IV 1 dose - Recheck ionized calcium in a.m.   Code Status:   full Family Communication: bedside Disposition Plan: admit   Total time spent 55 minutes.Greater than 50% of this time was spent in counseling, explanation of diagnosis, planning of further management, and coordination of care  [facility] Pager [phone]  If 7PM-7AM, please contact night-coverage [email or web address] Password [facility] 11/07/2015, 9:49 PM

## 2015-11-07 NOTE — ED Notes (Signed)
Pt arrived via [facility] from home c/o fall, lives alone, ETOH.  Laceration center forehead.  Pt not on blood thinners, denies any pain.

## 2015-11-07 NOTE — ED Provider Notes (Signed)
CSN: [id number]     Arrival date & time 11/07/15  1514 History   First MD Initiated Contact with Patient 11/07/15 1517     Chief Complaint  Patient presents with  . Fall     (Consider location/radiation/quality/duration/timing/severity/associated sxs/prior Treatment) The history is provided by the patient.     Victor Clarke is a(n) 81 y.o. male who presents for fall. HX is given by EMS and medical records. The patient has a history of Parkinson's disease, alcohol abuse, COPD,. Per EMS, the patient fell on his stairs at home.Marland Kitchen He was down for an unknown amount of time. Patient admits to drinking martinis.   Past Medical History  Diagnosis Date  . Hypertension   . Hypercholesteremia   . Gout   . COPD (chronic obstructive pulmonary disease) ([place])   . Detached retina     a. s/p surgical correction on the right.  . Prostate cancer ([place])     a. s/p TURP.  Marland Kitchen Ectopic atrial tachycardia ([place])     a. 08/2014 Echo: EF 55-60%, mildly dil LA.  . Parkinson's disease ([place])   . Left inguinal hernia     a. s/p repair ~ 10 yrs ago.   Past Surgical History  Procedure Laterality Date  . Colonoscopy w/ polypectomy  2001, 2006  . Hernia repair    . Prostate surgery     Family History  Problem Relation Age of Onset  . Stroke Father     died @ 37.  Marland Kitchen Heart attack Mother     died @ 46  . Stroke Brother     died in his 87's  . Heart attack Brother     died in his 20's   Social History  Substance Use Topics  . Smoking status: Former Smoker -- 2.00 packs/day for 22 years    Types: Cigarettes    Quit date: 11/18/1977  . Smokeless tobacco: Never Used  . Alcohol Use: 0.6 oz/week    1 Standard drinks or equivalent per week     Comment: 4 oz of gin daily.    Review of Systems  Unable to perform ROS: Mental status change      Allergies  Review of patient's allergies indicates no known allergies.  Home Medications   Prior to Admission medications   Medication Sig Start Date  End Date Taking? Authorizing Provider  allopurinol (ZYLOPRIM) 100 MG tablet Take 100 mg by mouth daily.     Historical Provider, MD  aspirin EC 81 MG tablet Take 81 mg by mouth daily.    Historical Provider, MD  atorvastatin (LIPITOR) 40 MG tablet Take 40 mg by mouth daily.    Historical Provider, MD  carbidopa-levodopa (SINEMET IR) 25-100 MG per tablet Take 0.5 tablets by mouth 3 (three) times daily.    Historical Provider, MD  diltiazem (CARDIZEM CD) 120 MG 24 hr capsule Take 1 capsule (120 mg total) by mouth daily. 09/29/14   Rogelia Mire, NP  feeding supplement, ENSURE ENLIVE, (ENSURE ENLIVE) LIQD Take 237 mLs by mouth 3 (three) times daily between meals. 08/08/15   Kelvin Cellar, MD  lisinopril (PRINIVIL,ZESTRIL) 5 MG tablet Take 1 tablet (5 mg total) by mouth daily. 09/12/15   Gerlene Fee, NP  Omega-3 Fatty Acids (FISH OIL) 1200 MG CAPS Take 2,400 mg by mouth daily.    Historical Provider, MD   There were no vitals taken for this visit. Physical Exam  Constitutional: He appears well-developed and well-nourished. No distress.  HENT:  Head: Normocephalic and atraumatic.  Eyes: Conjunctivae are normal. No scleral icterus.  Neck: Normal range of motion. Neck supple.  Cardiovascular: Normal rate, regular rhythm and normal heart sounds.   Pulmonary/Chest: Effort normal and breath sounds normal. No respiratory distress.  Abdominal: Soft. There is no tenderness.  Musculoskeletal: He exhibits no edema.  Neurological: He is alert.  Skin: Skin is warm and dry. He is not diaphoretic.  Multiple skin tears and lacerations to the arms.  5 cm, gaping laceration to the forehead  Psychiatric: His behavior is normal.  Nursing note and vitals reviewed.   ED Course  Procedures (including critical care time) Labs Review Labs Reviewed - No data to display  Imaging Review No results found. I have personally reviewed and evaluated these images and lab results as part of my medical  decision-making.   EKG Interpretation None      LACERATION REPAIR Performed by: Margarita Mail Authorized by: Margarita Mail Consent: Verbal consent obtained. Risks and benefits: risks, benefits and alternatives were discussed Consent given by: patient Patient identity confirmed: provided demographic data Prepped and Draped in normal sterile fashion Wound explored  Laceration Location: forehead  Laceration Length: 5 cm  No Foreign Bodies seen or palpated  Anesthesia: local infiltration  Local anesthetic: lidocaine 1% w/o epinephrine  Anesthetic total: 10 ml  Irrigation method: syringe Amount of cleaning: standard  Skin closure: 5.0 vicryl, 5.0 prolene  Number of sutures: 13  Technique: Sub Q;  SI  Patient tolerance: Patient tolerated the procedure well with no immediate complications.   MDM   Final diagnoses:  Fall (on) (from) other stairs and steps, initial encounter  Altered mental status, unspecified altered mental status type  Alcohol intoxication, uncomplicated ([place])  Lactic acid acidosis  UTI (lower urinary tract infection)    Patient with ALOC, ETOH intoxication,Lactic acidosis. Laceration repaired. +UTI. Patient will be admitted for observation.     Margarita Mail, PA-C 11/08/15 0143  Lacretia Leigh, MD 11/09/15 0000

## 2015-11-08 ENCOUNTER — Encounter (HOSPITAL_COMMUNITY): Payer: Self-pay | Admitting: General Practice

## 2015-11-08 DIAGNOSIS — E872 Acidosis: Secondary | ICD-10-CM | POA: Diagnosis present

## 2015-11-08 DIAGNOSIS — M109 Gout, unspecified: Secondary | ICD-10-CM | POA: Diagnosis present

## 2015-11-08 DIAGNOSIS — R571 Hypovolemic shock: Secondary | ICD-10-CM | POA: Diagnosis not present

## 2015-11-08 DIAGNOSIS — Z22322 Carrier or suspected carrier of Methicillin resistant Staphylococcus aureus: Secondary | ICD-10-CM | POA: Diagnosis not present

## 2015-11-08 DIAGNOSIS — J449 Chronic obstructive pulmonary disease, unspecified: Secondary | ICD-10-CM | POA: Diagnosis present

## 2015-11-08 DIAGNOSIS — F10239 Alcohol dependence with withdrawal, unspecified: Secondary | ICD-10-CM | POA: Diagnosis present

## 2015-11-08 DIAGNOSIS — D649 Anemia, unspecified: Secondary | ICD-10-CM | POA: Diagnosis present

## 2015-11-08 DIAGNOSIS — G20 Parkinson's disease: Secondary | ICD-10-CM | POA: Diagnosis present

## 2015-11-08 DIAGNOSIS — D696 Thrombocytopenia, unspecified: Secondary | ICD-10-CM | POA: Diagnosis not present

## 2015-11-08 DIAGNOSIS — D6959 Other secondary thrombocytopenia: Secondary | ICD-10-CM | POA: Diagnosis present

## 2015-11-08 DIAGNOSIS — F10120 Alcohol abuse with intoxication, uncomplicated: Secondary | ICD-10-CM | POA: Diagnosis not present

## 2015-11-08 DIAGNOSIS — Z87891 Personal history of nicotine dependence: Secondary | ICD-10-CM | POA: Diagnosis not present

## 2015-11-08 DIAGNOSIS — J9600 Acute respiratory failure, unspecified whether with hypoxia or hypercapnia: Secondary | ICD-10-CM | POA: Diagnosis not present

## 2015-11-08 DIAGNOSIS — W108XXA Fall (on) (from) other stairs and steps, initial encounter: Secondary | ICD-10-CM | POA: Diagnosis not present

## 2015-11-08 DIAGNOSIS — R4182 Altered mental status, unspecified: Secondary | ICD-10-CM | POA: Diagnosis present

## 2015-11-08 DIAGNOSIS — E7800 Pure hypercholesterolemia, unspecified: Secondary | ICD-10-CM | POA: Diagnosis not present

## 2015-11-08 DIAGNOSIS — I10 Essential (primary) hypertension: Secondary | ICD-10-CM | POA: Diagnosis not present

## 2015-11-08 DIAGNOSIS — Y908 Blood alcohol level of 240 mg/100 ml or more: Secondary | ICD-10-CM | POA: Diagnosis present

## 2015-11-08 DIAGNOSIS — E876 Hypokalemia: Secondary | ICD-10-CM | POA: Diagnosis not present

## 2015-11-08 DIAGNOSIS — S0181XA Laceration without foreign body of other part of head, initial encounter: Secondary | ICD-10-CM | POA: Diagnosis present

## 2015-11-08 DIAGNOSIS — R1312 Dysphagia, oropharyngeal phase: Secondary | ICD-10-CM | POA: Diagnosis present

## 2015-11-08 DIAGNOSIS — G9340 Encephalopathy, unspecified: Secondary | ICD-10-CM | POA: Diagnosis not present

## 2015-11-08 DIAGNOSIS — Y92009 Unspecified place in unspecified non-institutional (private) residence as the place of occurrence of the external cause: Secondary | ICD-10-CM | POA: Diagnosis not present

## 2015-11-08 DIAGNOSIS — F0390 Unspecified dementia without behavioral disturbance: Secondary | ICD-10-CM | POA: Diagnosis present

## 2015-11-08 DIAGNOSIS — E440 Moderate protein-calorie malnutrition: Secondary | ICD-10-CM | POA: Diagnosis not present

## 2015-11-08 DIAGNOSIS — J9602 Acute respiratory failure with hypercapnia: Secondary | ICD-10-CM | POA: Diagnosis not present

## 2015-11-08 DIAGNOSIS — S41112A Laceration without foreign body of left upper arm, initial encounter: Secondary | ICD-10-CM | POA: Diagnosis present

## 2015-11-08 DIAGNOSIS — G9341 Metabolic encephalopathy: Secondary | ICD-10-CM | POA: Diagnosis present

## 2015-11-08 DIAGNOSIS — A4159 Other Gram-negative sepsis: Secondary | ICD-10-CM | POA: Diagnosis present

## 2015-11-08 DIAGNOSIS — N390 Urinary tract infection, site not specified: Secondary | ICD-10-CM | POA: Diagnosis present

## 2015-11-08 DIAGNOSIS — Z79899 Other long term (current) drug therapy: Secondary | ICD-10-CM | POA: Diagnosis not present

## 2015-11-08 DIAGNOSIS — Z8546 Personal history of malignant neoplasm of prostate: Secondary | ICD-10-CM | POA: Diagnosis not present

## 2015-11-08 DIAGNOSIS — Z7982 Long term (current) use of aspirin: Secondary | ICD-10-CM | POA: Diagnosis not present

## 2015-11-08 DIAGNOSIS — W109XXA Fall (on) (from) unspecified stairs and steps, initial encounter: Secondary | ICD-10-CM | POA: Diagnosis present

## 2015-11-08 DIAGNOSIS — S022XXA Fracture of nasal bones, initial encounter for closed fracture: Secondary | ICD-10-CM | POA: Diagnosis present

## 2015-11-08 DIAGNOSIS — J9601 Acute respiratory failure with hypoxia: Secondary | ICD-10-CM | POA: Diagnosis not present

## 2015-11-08 DIAGNOSIS — S41111A Laceration without foreign body of right upper arm, initial encounter: Secondary | ICD-10-CM | POA: Diagnosis present

## 2015-11-08 DIAGNOSIS — F10129 Alcohol abuse with intoxication, unspecified: Secondary | ICD-10-CM | POA: Diagnosis not present

## 2015-11-08 DIAGNOSIS — E785 Hyperlipidemia, unspecified: Secondary | ICD-10-CM | POA: Diagnosis present

## 2015-11-08 DIAGNOSIS — Z66 Do not resuscitate: Secondary | ICD-10-CM | POA: Diagnosis not present

## 2015-11-08 LAB — COMPREHENSIVE METABOLIC PANEL
ALK PHOS: 47 U/L (ref 38–126)
ALT: 11 U/L — AB (ref 17–63)
AST: 50 U/L — ABNORMAL HIGH (ref 15–41)
Albumin: 3.1 g/dL — ABNORMAL LOW (ref 3.5–5.0)
Anion gap: 12 (ref 5–15)
BILIRUBIN TOTAL: 1 mg/dL (ref 0.3–1.2)
BUN: 11 mg/dL (ref 6–20)
CALCIUM: 8.3 mg/dL — AB (ref 8.9–10.3)
CO2: 21 mmol/L — ABNORMAL LOW (ref 22–32)
CREATININE: 0.84 mg/dL (ref 0.61–1.24)
Chloride: 111 mmol/L (ref 101–111)
Glucose, Bld: 113 mg/dL — ABNORMAL HIGH (ref 65–99)
Potassium: 3.6 mmol/L (ref 3.5–5.1)
Sodium: 144 mmol/L (ref 135–145)
TOTAL PROTEIN: 5.6 g/dL — AB (ref 6.5–8.1)

## 2015-11-08 LAB — CBC
HCT: 34 % — ABNORMAL LOW (ref 39.0–52.0)
HCT: 33.5 % — ABNORMAL LOW (ref 39.0–52.0)
HEMOGLOBIN: 11.5 g/dL — AB (ref 13.0–17.0)
HEMOGLOBIN: 11.5 g/dL — AB (ref 13.0–17.0)
MCH: 32.9 pg (ref 26.0–34.0)
MCH: 33.3 pg (ref 26.0–34.0)
MCHC: 33.8 g/dL (ref 30.0–36.0)
MCHC: 34.3 g/dL (ref 30.0–36.0)
MCV: 97.1 fL (ref 78.0–100.0)
MCV: 97.1 fL (ref 78.0–100.0)
Platelets: 99 10*3/uL — ABNORMAL LOW (ref 150–400)
Platelets: 108 10*3/uL — ABNORMAL LOW (ref 150–400)
RBC: 3.5 MIL/uL — AB (ref 4.22–5.81)
RBC: 3.45 MIL/uL — AB (ref 4.22–5.81)
RDW: 18.2 % — ABNORMAL HIGH (ref 11.5–15.5)
RDW: 18.3 % — ABNORMAL HIGH (ref 11.5–15.5)
WBC: 4.9 10*3/uL (ref 4.0–10.5)
WBC: 5 10*3/uL (ref 4.0–10.5)

## 2015-11-08 LAB — MAGNESIUM
MAGNESIUM: 1.1 mg/dL — AB (ref 1.7–2.4)
Magnesium: 1.2 mg/dL — ABNORMAL LOW (ref 1.7–2.4)

## 2015-11-08 LAB — LACTIC ACID, PLASMA
Lactic Acid, Venous: 3.3 mmol/L (ref 0.5–2.0)
Lactic Acid, Venous: 1.3 mmol/L (ref 0.5–2.0)

## 2015-11-08 LAB — PHOSPHORUS: Phosphorus: 4.1 mg/dL (ref 2.5–4.6)

## 2015-11-08 MED ORDER — THIAMINE HCL 100 MG/ML IJ SOLN
100.0000 mg | Freq: Every day | INTRAMUSCULAR | Status: DC
  Filled 2015-11-08: qty 2

## 2015-11-08 MED ORDER — SODIUM CHLORIDE 0.9 % IV BOLUS (SEPSIS)
1000.0000 mL | Freq: Once | INTRAVENOUS | Status: AC
  Administered 2015-11-08: 1000 mL via INTRAVENOUS

## 2015-11-08 MED ORDER — MAGNESIUM SULFATE 2 GM/50ML IV SOLN
2.0000 g | Freq: Once | INTRAVENOUS | Status: AC
  Administered 2015-11-08: 2 g via INTRAVENOUS
  Filled 2015-11-08: qty 50

## 2015-11-08 MED ORDER — THIAMINE HCL 100 MG/ML IJ SOLN
Freq: Once | INTRAVENOUS | Status: AC
  Administered 2015-11-08: 01:00:00 via INTRAVENOUS
  Filled 2015-11-08: qty 1000

## 2015-11-08 MED ORDER — OMEGA-3-ACID ETHYL ESTERS 1 G PO CAPS
1.0000 g | ORAL_CAPSULE | Freq: Every day | ORAL | Status: DC
  Administered 2015-11-08: 1 g via ORAL
  Filled 2015-11-08 (×2): qty 1

## 2015-11-08 MED ORDER — SODIUM CHLORIDE 0.9 % IV SOLN
INTRAVENOUS | Status: AC
Start: 2015-11-08 — End: 2015-11-08
  Administered 2015-11-08: 03:00:00 via INTRAVENOUS

## 2015-11-08 MED ORDER — LORAZEPAM 2 MG/ML IJ SOLN
1.0000 mg | Freq: Four times a day (QID) | INTRAMUSCULAR | Status: DC | PRN
  Administered 2015-11-08 – 2015-11-09 (×5): 1 mg via INTRAVENOUS
  Filled 2015-11-08 (×6): qty 1

## 2015-11-08 MED ORDER — CARBIDOPA-LEVODOPA 25-100 MG PO TABS
0.5000 | ORAL_TABLET | Freq: Three times a day (TID) | ORAL | Status: DC
  Administered 2015-11-08 – 2015-11-09 (×6): 0.5 via ORAL
  Filled 2015-11-08 (×2): qty 1
  Filled 2015-11-08 (×3): qty 0.5
  Filled 2015-11-08 (×2): qty 1
  Filled 2015-11-08: qty 0.5
  Filled 2015-11-08 (×2): qty 1

## 2015-11-08 MED ORDER — LORAZEPAM 2 MG/ML IJ SOLN
2.0000 mg | Freq: Once | INTRAMUSCULAR | Status: AC
  Administered 2015-11-08: 2 mg via INTRAVENOUS
  Filled 2015-11-08: qty 1

## 2015-11-08 MED ORDER — ENSURE ENLIVE PO LIQD
237.0000 mL | Freq: Three times a day (TID) | ORAL | Status: DC
  Administered 2015-11-08 – 2015-11-09 (×4): 237 mL via ORAL

## 2015-11-08 MED ORDER — LORAZEPAM 1 MG PO TABS: 1.0000 mg | ORAL_TABLET | Freq: Four times a day (QID) | ORAL | Status: DC | PRN

## 2015-11-08 MED ORDER — ADULT MULTIVITAMIN W/MINERALS CH
1.0000 | ORAL_TABLET | Freq: Every day | ORAL | Status: DC
  Administered 2015-11-08 – 2015-11-09 (×2): 1 via ORAL
  Filled 2015-11-08 (×2): qty 1

## 2015-11-08 MED ORDER — PNEUMOCOCCAL VAC POLYVALENT 25 MCG/0.5ML IJ INJ
0.5000 mL | INJECTION | INTRAMUSCULAR | Status: DC
  Filled 2015-11-08: qty 0.5

## 2015-11-08 MED ORDER — ATORVASTATIN CALCIUM 40 MG PO TABS
40.0000 mg | ORAL_TABLET | Freq: Every day | ORAL | Status: DC
  Administered 2015-11-08 – 2015-11-09 (×2): 40 mg via ORAL
  Filled 2015-11-08 (×2): qty 1

## 2015-11-08 MED ORDER — LISINOPRIL 5 MG PO TABS
5.0000 mg | ORAL_TABLET | Freq: Every day | ORAL | Status: DC
  Administered 2015-11-08 – 2015-11-09 (×2): 5 mg via ORAL
  Filled 2015-11-08 (×3): qty 1

## 2015-11-08 MED ORDER — DEXTROSE 5 % IV SOLN
1.0000 g | INTRAVENOUS | Status: DC
  Administered 2015-11-08 – 2015-11-09 (×2): 1 g via INTRAVENOUS
  Filled 2015-11-08 (×2): qty 10

## 2015-11-08 MED ORDER — ENOXAPARIN SODIUM 40 MG/0.4ML ~~LOC~~ SOLN
40.0000 mg | SUBCUTANEOUS | Status: DC
  Administered 2015-11-08 – 2015-11-10 (×3): 40 mg via SUBCUTANEOUS
  Filled 2015-11-08 (×3): qty 0.4

## 2015-11-08 MED ORDER — ASPIRIN EC 81 MG PO TBEC
81.0000 mg | DELAYED_RELEASE_TABLET | Freq: Every day | ORAL | Status: DC
  Administered 2015-11-08 – 2015-11-09 (×2): 81 mg via ORAL
  Filled 2015-11-08 (×2): qty 1

## 2015-11-08 MED ORDER — ALLOPURINOL 100 MG PO TABS
100.0000 mg | ORAL_TABLET | Freq: Every day | ORAL | Status: DC
  Administered 2015-11-08 – 2015-11-10 (×3): 100 mg via ORAL
  Filled 2015-11-08 (×3): qty 1

## 2015-11-08 MED ORDER — VITAMIN B-1 100 MG PO TABS
100.0000 mg | ORAL_TABLET | Freq: Every day | ORAL | Status: DC
  Administered 2015-11-08 – 2015-11-09 (×2): 100 mg via ORAL
  Filled 2015-11-08 (×2): qty 1

## 2015-11-08 MED ORDER — FOLIC ACID 1 MG PO TABS
1.0000 mg | ORAL_TABLET | Freq: Every day | ORAL | Status: DC
  Administered 2015-11-08 – 2015-11-09 (×2): 1 mg via ORAL
  Filled 2015-11-08 (×2): qty 1

## 2015-11-08 MED ORDER — DILTIAZEM HCL ER COATED BEADS 120 MG PO CP24
120.0000 mg | ORAL_CAPSULE | Freq: Every day | ORAL | Status: DC
  Administered 2015-11-08 – 2015-11-09 (×2): 120 mg via ORAL
  Filled 2015-11-08 (×2): qty 1

## 2015-11-08 NOTE — Significant Event (Signed)
On call provider notified of recent lactic acid of 3.3. New orders for a 1L bolus of NS and maintenance fluid @ 100cc/hour.

## 2015-11-08 NOTE — Evaluation (Signed)
Physical Therapy Evaluation Patient Details Name: Victor Clarke MRN: [id number] DOB: 1929-05-11 Today's Date: 11/08/2015   History of Present Illness  81 yo male with fall prior to admission, has EtOH history and Parkinson's, noted to have acute encephalopathy.  New nose fracture from the fall with substantial facial lacerations.  Clinical Impression  Pt was able to maneuver in his room for short trips but extremely unsafe and not able to walk or transfer without continual hands on help. Pt is not aware of lines (IV, cath) and will benefit from SNF stay to increase strength and control of standing balance for all mobility.  Will follow PT in hospital until dc.    Follow Up Recommendations SNF    Equipment Recommendations  None recommended by PT (SNF may decide)    Recommendations for Other Services Rehab consult     Precautions / Restrictions Precautions Precautions: Fall Precaution Comments: Pt is poor pathway planner with walker Restrictions Weight Bearing Restrictions: No      Mobility  Bed Mobility Overal bed mobility: Modified Independent                Transfers Overall transfer level: Needs assistance Equipment used: Rolling walker (2 wheeled);1 person hand held assist Transfers: Sit to/from [facility] Sit to Stand: Min guard;Min assist Stand pivot transfers: Min guard;Min assist       General transfer comment: reminders for hand placement and cues for sequence  Ambulation/Gait Ambulation/Gait assistance: Min assist;Min guard Ambulation Distance (Feet): 30 Feet Assistive device: Rolling walker (2 wheeled);1 person hand held assist Gait Pattern/deviations: Step-to pattern;Trunk flexed;Drifts right/left;Wide base of support;Ataxic Gait velocity: reduced Gait velocity interpretation: Below normal speed for age/gender General Gait Details: appears ataxic, has LLE weakness and has been observant of the same as PT noted  Stairs             Wheelchair Mobility    Modified Rankin (Stroke Patients Only)       Balance Overall balance assessment: Needs assistance;History of Falls Sitting-balance support: Feet supported Sitting balance-Leahy Scale: Fair   Postural control: Posterior lean Standing balance support: Bilateral upper extremity supported Standing balance-Leahy Scale: Poor Standing balance comment: unsure on his feet, tends to let walker get away from him                             Pertinent Vitals/Pain Pain Assessment: No/denies pain    Home Living Family/patient expects to be discharged to:: Unsure Living Arrangements: Alone                    Prior Function Level of Independence: Independent (states he may have needed walker)               Hand Dominance        Extremity/Trunk Assessment   Upper Extremity Assessment: Overall WFL for tasks assessed           Lower Extremity Assessment: Generalized weakness      Cervical / Trunk Assessment: Kyphotic  Communication   Communication: No difficulties  Cognition Arousal/Alertness: Awake/alert Behavior During Therapy: Impulsive (needs prompts to handle walker properly) Overall Cognitive Status: No family/caregiver present to determine baseline cognitive functioning       Memory: Decreased recall of precautions;Decreased short-term memory              General Comments General comments (skin integrity, edema, etc.): Pt was able to stand and walk but  is unaware of his lines, unaware of his deficits.  Is mainly concerned that he needs to get his license back to drive but is not safe to walk alone    Exercises        Assessment/Plan    PT Assessment Patient needs continued PT services  PT Diagnosis Generalized weakness;Difficulty walking   PT Problem List Decreased strength;Decreased range of motion;Decreased activity tolerance;Decreased balance;Decreased mobility;Decreased coordination;Decreased  cognition;Decreased knowledge of use of DME;Decreased safety awareness;Decreased knowledge of precautions;Decreased skin integrity  PT Treatment Interventions DME instruction;Gait training;Functional mobility training;Therapeutic activities;Therapeutic exercise;Balance training;Neuromuscular re-education;Patient/family education   PT Goals (Current goals can be found in the Care Plan section) Acute Rehab PT Goals Patient Stated Goal: to get to drive again PT Goal Formulation: With patient Time For Goal Achievement: 11/22/15 Potential to Achieve Goals: Good    Frequency Min 2X/week   Barriers to discharge Inaccessible home environment;Decreased caregiver support alone at home with ramped entrance    Co-evaluation               End of Session   Activity Tolerance: Patient limited by fatigue;Patient limited by lethargy;Other (comment) (significant balance changes) Patient left: in bed;with call bell/phone within reach;with bed alarm set;Other (comment) (Pt asked to go back to bed) Nurse Communication: Mobility status;Other (comment) (discharge plan, also shared with case manager)    Functional Assessment Tool Used: clinical judgment Functional Limitation: Mobility: Walking and moving around Mobility: Walking and Moving Around Current Status [id number]): At least 40 percent but less than 60 percent impaired, limited or restricted Mobility: Walking and Moving Around Goal Status [phone]): At least 40 percent but less than 60 percent impaired, limited or restricted    Time: 1002-1030 PT Time Calculation (min) (ACUTE ONLY): 28 min   Charges:   PT Evaluation $Initial PT Evaluation Tier I: 1 Procedure PT Treatments $Gait Training: 8-22 mins   PT G Codes:   PT G-Codes **NOT FOR INPATIENT CLASS** Functional Assessment Tool Used: clinical judgment Functional Limitation: Mobility: Walking and moving around Mobility: Walking and Moving Around Current Status [id number]): At least 40 percent but  less than 60 percent impaired, limited or restricted Mobility: Walking and Moving Around Goal Status [phone]): At least 40 percent but less than 60 percent impaired, limited or restricted    Ramond Dial 11/08/2015, 1:11 PM   Mee Hives, PT MS Acute Rehab Dept. Number: [facility] [id number] and [place] [phone]

## 2015-11-08 NOTE — Progress Notes (Signed)
PATIENT DETAILS Name: Victor Clarke Age: 81 y.o. Sex: male Date of Birth: 19-Dec-1928 Admit Date: 11/07/2015 Admitting Physician Norval Morton, MD [id number] Celesta Gentile, MD  Subjective: Mildly tremulous. But mostly awake and alert. Breakfast this morning.  Assessment/Plan: Principal Problem: Acute encephalopathy: Secondary to alcohol intoxication and UTI . Much improved, but at risk for delirium tremens. CT head negative for acute abnormalities   Active Problems: UTI: Continue Rocephin. Follow cultures. Afebrile and without leukocytosis  Alcohol abuse: Mildly tremulous, continue Ativan per protocol. At risk for DTs. Counseled  Thrombocytopenia: Suspect secondary to alcohol use. Follow.  History of Parkinson's: Continue Sinemet  Hypertension: Controlled, continue lisinopril and Cardizem.  Dyslipidemia: Continue statin   Gout: No evidence of acute flare, continue allopurinol  Mechanical fall with facial laceration: Suspect secondary to alcohol intoxication, denies any loss of consciousness-however poor historian. Sutured in the emergency room, received tDaP. Await physical therapy evaluation-suspect may need SNF  Disposition: Remain inpatient  Antimicrobial agents  See below  Anti-infectives    Start     Dose/Rate Route Frequency Ordered Stop   11/07/15 1945  cefTRIAXone (ROCEPHIN) 1 g in dextrose 5 % 50 mL IVPB     1 g 100 mL/hr over 30 Minutes Intravenous  Once 11/07/15 1931 11/07/15 2032      DVT Prophylaxis: Prophylactic Lovenox   Code Status: Full code  Family Communication [name] Santelli-[phone]  Procedures: None  CONSULTS:  None  Time spent 40 minutes-Greater than 50% of this time was spent in counseling, explanation of diagnosis, planning of further management, and coordination of care.  MEDICATIONS: Scheduled Meds: . allopurinol  100 mg Oral Daily  . aspirin EC  81 mg Oral Daily  . atorvastatin  40 mg Oral  Daily  . carbidopa-levodopa  0.5 tablet Oral TID  . diltiazem  120 mg Oral Daily  . enoxaparin (LOVENOX) injection  40 mg Subcutaneous Q24H  . feeding supplement (ENSURE ENLIVE)  237 mL Oral TID BM  . folic acid  1 mg Oral Daily  . lisinopril  5 mg Oral Daily  . magnesium sulfate 1 - 4 g bolus IVPB  2 g Intravenous Once  . multivitamin with minerals  1 tablet Oral Daily  . omega-3 acid ethyl esters  1 g Oral Daily  . [START ON 11/09/2015] pneumococcal 23 valent vaccine  0.5 mL Intramuscular Tomorrow-1000  . thiamine  100 mg Oral Daily   Or  . thiamine  100 mg Intravenous Daily   Continuous Infusions:  PRN Meds:.LORazepam **OR** LORazepam    PHYSICAL EXAM: Vital signs in last 24 hours: Filed Vitals:   11/08/15 0023 11/08/15 0034 11/08/15 0605 11/08/15 1154  BP:  152/72 132/70 116/58  Pulse:  62 59 67  Temp: 99 F (37.2 C) 98.1 F (36.7 C) 97.4 F (36.3 C) 97.5 F (36.4 C)  TempSrc: Oral Oral Oral Oral  Resp:  17 17 18   Height:  5\' 9"  (1.753 m)    Weight:  63.005 kg (138 lb 14.4 oz)    SpO2:  96% 100% 100%    Weight change:  Filed Weights   11/08/15 0034  Weight: 63.005 kg (138 lb 14.4 oz)   Body mass index is 20.5 kg/(m^2).   Gen Exam: Awake and alert with clear speech. Laceration with sutures-clots to the Forehead Neck: Supple, No JVD.   Chest: B/L Clear.   CVS: S1 S2 Regular, no murmurs.  Abdomen: soft, BS +, non tender, non distended.  Extremities: no edema, lower extremities warm to touch. Neurologic: Non Focal.   Skin: No Rash.   Wounds: N/A.   Intake/Output from previous day:  Intake/Output Summary (Last 24 hours) at 11/08/15 1251 Last data filed at 11/08/15 1049  Gross per 24 hour  Intake 1055.42 ml  Output    880 ml  Net 175.42 ml     LAB RESULTS: CBC  Recent Labs Lab 11/07/15 1617 11/07/15 1626 11/08/15 0530 11/08/15 0916  WBC 4.8  --  4.9 5.0  HGB 11.9* 12.9* 11.5* 11.5*  HCT 34.4* 38.0* 34.0* 33.5*  PLT 105*  --  99* 108*  MCV  96.4  --  97.1 97.1  MCH 33.3  --  32.9 33.3  MCHC 34.6  --  33.8 34.3  RDW 18.1*  --  18.2* 18.3*  LYMPHSABS 0.9  --   --   --   MONOABS 0.3  --   --   --   EOSABS 0.2  --   --   --   BASOSABS 0.0  --   --   --     Chemistries   Recent Labs Lab 11/07/15 1617 11/07/15 1626 11/08/15 0109 11/08/15 0916  NA 144 145  --  144  K 3.9 3.8  --  3.6  CL 110 107  --  111  CO2 21*  --   --  21*  GLUCOSE 115* 110*  --  113*  BUN 19 22*  --  11  CREATININE 0.84 1.10  --  0.84  CALCIUM 8.7*  --   --  8.3*  MG  --   --  1.2* 1.1*    CBG:  Recent Labs Lab 11/07/15 1618  GLUCAP 101*    GFR Estimated Creatinine Clearance: 58.3 mL/min (by C-G formula based on Cr of 0.84).  Coagulation profile No results for input(s): INR, PROTIME in the last 168 hours.  Cardiac Enzymes No results for input(s): CKMB, TROPONINI, MYOGLOBIN in the last 168 hours.  Invalid input(s): CK  Invalid input(s): POCBNP No results for input(s): DDIMER in the last 72 hours. No results for input(s): HGBA1C in the last 72 hours. No results for input(s): CHOL, HDL, LDLCALC, TRIG, CHOLHDL, LDLDIRECT in the last 72 hours. No results for input(s): TSH, T4TOTAL, T3FREE, THYROIDAB in the last 72 hours.  Invalid input(s): FREET3 No results for input(s): VITAMINB12, FOLATE, FERRITIN, TIBC, IRON, RETICCTPCT in the last 72 hours. No results for input(s): LIPASE, AMYLASE in the last 72 hours.  Urine Studies No results for input(s): UHGB, CRYS in the last 72 hours.  Invalid input(s): UACOL, UAPR, USPG, UPH, UTP, UGL, UKET, UBIL, UNIT, UROB, ULEU, UEPI, UWBC, URBC, UBAC, CAST, UCOM, BILUA  MICROBIOLOGY: No results found for this or any previous visit (from the past 240 hour(s)).  RADIOLOGY STUDIES/RESULTS: Ct Head Wo Contrast  11/07/2015  CLINICAL DATA:  Passed out today while cooking, now with cut on the nose. EXAM: CT HEAD WITHOUT CONTRAST CT MAXILLOFACIAL WITHOUT CONTRAST CT CERVICAL SPINE WITHOUT CONTRAST  TECHNIQUE: Multidetector CT imaging of the head, cervical spine, and maxillofacial structures were performed using the standard protocol without intravenous contrast. Multiplanar CT image reconstructions of the cervical spine and maxillofacial structures were also generated. COMPARISON:  Head CT - 07/31/2015; 06/12/2015; 12/28/2014 FINDINGS: CT HEAD FINDINGS Similar findings of advanced atrophy with sulcal prominence centralized volume loss with commensurate ex vacuo dilatation of the ventricular system. Scattered periventricular hypodensities compatible microvascular  ischemic disease. Bilateral basal ganglial calcifications. Given background parenchymal abnormalities, there is no CT evidence of superimposed acute large territory infarct. No intraparenchymal or extra-axial mass or hemorrhage. Unchanged size and configuration of the ventricles and basilar cisterns. No midline shift. CT MAXILLOFACIAL FINDINGS Evaluation of the facial bones is degraded secondary to patient motion artifact. There is mild soft tissue swelling about the bridge of the nose with potential mildly displaced nasal bone fracture (image 67, series 5). Otherwise, no definite displaced facial fractures given limitation of patient motion. Normal appearance of the bilateral pterygoid plates. Normal appearance of the mandible. The bilateral mandibular condyles are normally located. Normal appearance of the bilateral zygomatic arches. Normal noncontrast appearance of the bilateral orbits and globes. Post left-sided cataract surgery. There is mild rightward nasal septal deviation. There is minimal mucosal thickening within the left maxillary sinus. The remaining paranasal sinuses and mastoid air cells are normally aerated. No air-fluid levels. CT CERVICAL SPINE FINDINGS Evaluation of cervical spine is minimally degraded secondary to patient motion artifact. C1 to the superior endplate of T1 is imaged. There is mild (approximately 3 mm) of presumably  degenerative retrolisthesis of C4 upon C5. Otherwise, normal alignment of the cervical spine. No anterolisthesis or retrolisthesis. The bilateral facets are normally aligned. The dens is normally positioned between the lateral masses of C1. Normal atlantodental and atlantoaxial articulations. No fracture or static subluxation of the cervical spine. Cervical vertebral and heights are preserved. Prevertebral soft tissues are normal. Moderate to severe multilevel cervical spine DDD, likely worse at C4-C5 and to a lesser extent, C3-C4 and C5-C6 with disc space height loss, endplate irregularity and sclerosis. There is a suspected partial ankylosis involving the C4-C5 and C5-C6 intervertebral disc spaces. Atherosclerotic plaque within the bilateral carotid bulbs. No bulky cervical lymphadenopathy on this noncontrast examination. Limited visualization lung apices demonstrates biapical bullous emphysematous change, right greater than left. IMPRESSION: 1. Advanced atrophy and microvascular ischemic disease without acute intracranial process. 2. Suspected minimally displaced nasal bone fracture. No additional facial fractures identified on this motion degraded examination. 3. Motion degraded examination without evidence of fracture or static subluxation of the cervical spine. 4. Moderate to severe multilevel cervical spine DDD. Electronically Signed   By: Sandi Mariscal M.D.   On: 11/07/2015 17:22   Ct Cervical Spine Wo Contrast  11/07/2015  CLINICAL DATA:  Passed out today while cooking, now with cut on the nose. EXAM: CT HEAD WITHOUT CONTRAST CT MAXILLOFACIAL WITHOUT CONTRAST CT CERVICAL SPINE WITHOUT CONTRAST TECHNIQUE: Multidetector CT imaging of the head, cervical spine, and maxillofacial structures were performed using the standard protocol without intravenous contrast. Multiplanar CT image reconstructions of the cervical spine and maxillofacial structures were also generated. COMPARISON:  Head CT - 07/31/2015;  06/12/2015; 12/28/2014 FINDINGS: CT HEAD FINDINGS Similar findings of advanced atrophy with sulcal prominence centralized volume loss with commensurate ex vacuo dilatation of the ventricular system. Scattered periventricular hypodensities compatible microvascular ischemic disease. Bilateral basal ganglial calcifications. Given background parenchymal abnormalities, there is no CT evidence of superimposed acute large territory infarct. No intraparenchymal or extra-axial mass or hemorrhage. Unchanged size and configuration of the ventricles and basilar cisterns. No midline shift. CT MAXILLOFACIAL FINDINGS Evaluation of the facial bones is degraded secondary to patient motion artifact. There is mild soft tissue swelling about the bridge of the nose with potential mildly displaced nasal bone fracture (image 67, series 5). Otherwise, no definite displaced facial fractures given limitation of patient motion. Normal appearance of the bilateral pterygoid plates. Normal appearance of  the mandible. The bilateral mandibular condyles are normally located. Normal appearance of the bilateral zygomatic arches. Normal noncontrast appearance of the bilateral orbits and globes. Post left-sided cataract surgery. There is mild rightward nasal septal deviation. There is minimal mucosal thickening within the left maxillary sinus. The remaining paranasal sinuses and mastoid air cells are normally aerated. No air-fluid levels. CT CERVICAL SPINE FINDINGS Evaluation of cervical spine is minimally degraded secondary to patient motion artifact. C1 to the superior endplate of T1 is imaged. There is mild (approximately 3 mm) of presumably degenerative retrolisthesis of C4 upon C5. Otherwise, normal alignment of the cervical spine. No anterolisthesis or retrolisthesis. The bilateral facets are normally aligned. The dens is normally positioned between the lateral masses of C1. Normal atlantodental and atlantoaxial articulations. No fracture or  static subluxation of the cervical spine. Cervical vertebral and heights are preserved. Prevertebral soft tissues are normal. Moderate to severe multilevel cervical spine DDD, likely worse at C4-C5 and to a lesser extent, C3-C4 and C5-C6 with disc space height loss, endplate irregularity and sclerosis. There is a suspected partial ankylosis involving the C4-C5 and C5-C6 intervertebral disc spaces. Atherosclerotic plaque within the bilateral carotid bulbs. No bulky cervical lymphadenopathy on this noncontrast examination. Limited visualization lung apices demonstrates biapical bullous emphysematous change, right greater than left. IMPRESSION: 1. Advanced atrophy and microvascular ischemic disease without acute intracranial process. 2. Suspected minimally displaced nasal bone fracture. No additional facial fractures identified on this motion degraded examination. 3. Motion degraded examination without evidence of fracture or static subluxation of the cervical spine. 4. Moderate to severe multilevel cervical spine DDD. Electronically Signed   By: Sandi Mariscal M.D.   On: 11/07/2015 17:22   Ct Maxillofacial Wo Cm  11/07/2015  CLINICAL DATA:  Passed out today while cooking, now with cut on the nose. EXAM: CT HEAD WITHOUT CONTRAST CT MAXILLOFACIAL WITHOUT CONTRAST CT CERVICAL SPINE WITHOUT CONTRAST TECHNIQUE: Multidetector CT imaging of the head, cervical spine, and maxillofacial structures were performed using the standard protocol without intravenous contrast. Multiplanar CT image reconstructions of the cervical spine and maxillofacial structures were also generated. COMPARISON:  Head CT - 07/31/2015; 06/12/2015; 12/28/2014 FINDINGS: CT HEAD FINDINGS Similar findings of advanced atrophy with sulcal prominence centralized volume loss with commensurate ex vacuo dilatation of the ventricular system. Scattered periventricular hypodensities compatible microvascular ischemic disease. Bilateral basal ganglial calcifications.  Given background parenchymal abnormalities, there is no CT evidence of superimposed acute large territory infarct. No intraparenchymal or extra-axial mass or hemorrhage. Unchanged size and configuration of the ventricles and basilar cisterns. No midline shift. CT MAXILLOFACIAL FINDINGS Evaluation of the facial bones is degraded secondary to patient motion artifact. There is mild soft tissue swelling about the bridge of the nose with potential mildly displaced nasal bone fracture (image 67, series 5). Otherwise, no definite displaced facial fractures given limitation of patient motion. Normal appearance of the bilateral pterygoid plates. Normal appearance of the mandible. The bilateral mandibular condyles are normally located. Normal appearance of the bilateral zygomatic arches. Normal noncontrast appearance of the bilateral orbits and globes. Post left-sided cataract surgery. There is mild rightward nasal septal deviation. There is minimal mucosal thickening within the left maxillary sinus. The remaining paranasal sinuses and mastoid air cells are normally aerated. No air-fluid levels. CT CERVICAL SPINE FINDINGS Evaluation of cervical spine is minimally degraded secondary to patient motion artifact. C1 to the superior endplate of T1 is imaged. There is mild (approximately 3 mm) of presumably degenerative retrolisthesis of C4 upon C5. Otherwise, normal  alignment of the cervical spine. No anterolisthesis or retrolisthesis. The bilateral facets are normally aligned. The dens is normally positioned between the lateral masses of C1. Normal atlantodental and atlantoaxial articulations. No fracture or static subluxation of the cervical spine. Cervical vertebral and heights are preserved. Prevertebral soft tissues are normal. Moderate to severe multilevel cervical spine DDD, likely worse at C4-C5 and to a lesser extent, C3-C4 and C5-C6 with disc space height loss, endplate irregularity and sclerosis. There is a suspected  partial ankylosis involving the C4-C5 and C5-C6 intervertebral disc spaces. Atherosclerotic plaque within the bilateral carotid bulbs. No bulky cervical lymphadenopathy on this noncontrast examination. Limited visualization lung apices demonstrates biapical bullous emphysematous change, right greater than left. IMPRESSION: 1. Advanced atrophy and microvascular ischemic disease without acute intracranial process. 2. Suspected minimally displaced nasal bone fracture. No additional facial fractures identified on this motion degraded examination. 3. Motion degraded examination without evidence of fracture or static subluxation of the cervical spine. 4. Moderate to severe multilevel cervical spine DDD. Electronically Signed   By: Sandi Mariscal M.D.   On: 11/07/2015 17:22    Oren Binet, MD  [facility] Pager:[phone]  If 7PM-7AM, please contact night-coverage [email or web address] Password [facility] 11/08/2015, 12:51 PM

## 2015-11-08 NOTE — Clinical Social Work Note (Signed)
Attempted to assess patient for ETOH use. Patient not appropriate for assessment at this time due to confusion. CSW will attempt to assess at a more appropriate time.   Liz Beach MSW, [place], [place], [phone]

## 2015-11-09 ENCOUNTER — Inpatient Hospital Stay (HOSPITAL_COMMUNITY): Payer: Medicare Other

## 2015-11-09 LAB — COMPREHENSIVE METABOLIC PANEL
ALBUMIN: 3.5 g/dL (ref 3.5–5.0)
ALK PHOS: 48 U/L (ref 38–126)
ALT: 13 U/L — ABNORMAL LOW (ref 17–63)
AST: 42 U/L — AB (ref 15–41)
Anion gap: 13 (ref 5–15)
BILIRUBIN TOTAL: 1.1 mg/dL (ref 0.3–1.2)
BUN: 9 mg/dL (ref 6–20)
CHLORIDE: 106 mmol/L (ref 101–111)
CO2: 21 mmol/L — ABNORMAL LOW (ref 22–32)
CREATININE: 0.9 mg/dL (ref 0.61–1.24)
Calcium: 8.8 mg/dL — ABNORMAL LOW (ref 8.9–10.3)
GFR calc non Af Amer: >60 mL/min (ref >60)
Glucose, Bld: 92 mg/dL (ref 65–99)
Potassium: 3.5 mmol/L (ref 3.5–5.1)
SODIUM: 140 mmol/L (ref 135–145)
Total Protein: 6.8 g/dL (ref 6.5–8.1)

## 2015-11-09 LAB — BASIC METABOLIC PANEL
Anion gap: 13 (ref 5–15)
BUN: 12 mg/dL (ref 6–20)
CHLORIDE: 108 mmol/L (ref 101–111)
CO2: 17 mmol/L — AB (ref 22–32)
Calcium: UNDETERMINED mg/dL (ref 8.9–10.3)
Creatinine, Ser: 0.8 mg/dL (ref 0.61–1.24)
GFR calc Af Amer: >60 mL/min (ref >60)
Glucose, Bld: 92 mg/dL (ref 65–99)
Potassium: 3.5 mmol/L (ref 3.5–5.1)
SODIUM: 138 mmol/L (ref 135–145)

## 2015-11-09 LAB — CBC WITH DIFFERENTIAL/PLATELET
BASOS ABS: 0 10*3/uL (ref 0.0–0.1)
BASOS PCT: 0 %
EOS ABS: 0.1 10*3/uL (ref 0.0–0.7)
Eosinophils Relative: 3 %
HEMATOCRIT: 38.5 % — AB (ref 39.0–52.0)
HEMOGLOBIN: 12.9 g/dL — AB (ref 13.0–17.0)
Lymphocytes Relative: 13 %
Lymphs Abs: 0.7 10*3/uL (ref 0.7–4.0)
MCH: 32.5 pg (ref 26.0–34.0)
MCHC: 33.5 g/dL (ref 30.0–36.0)
MCV: 97 fL (ref 78.0–100.0)
Monocytes Absolute: 0.4 10*3/uL (ref 0.1–1.0)
Monocytes Relative: 9 %
NEUTROS ABS: 3.8 10*3/uL (ref 1.7–7.7)
NEUTROS PCT: 75 %
Platelets: 107 10*3/uL — ABNORMAL LOW (ref 150–400)
RBC: 3.97 MIL/uL — ABNORMAL LOW (ref 4.22–5.81)
RDW: 18.3 % — ABNORMAL HIGH (ref 11.5–15.5)
WBC: 5.1 10*3/uL (ref 4.0–10.5)

## 2015-11-09 LAB — URINALYSIS, ROUTINE W REFLEX MICROSCOPIC
BILIRUBIN URINE: NEGATIVE
Glucose, UA: NEGATIVE mg/dL
Hgb urine dipstick: NEGATIVE
KETONES UR: 40 mg/dL — AB
Leukocytes, UA: NEGATIVE
NITRITE: NEGATIVE
Protein, ur: NEGATIVE mg/dL
SPECIFIC GRAVITY, URINE: 1.018 (ref 1.005–1.030)
pH: 5.5 (ref 5.0–8.0)

## 2015-11-09 LAB — CBC
HEMATOCRIT: 32.3 % — AB (ref 39.0–52.0)
Hemoglobin: 11.1 g/dL — ABNORMAL LOW (ref 13.0–17.0)
MCH: 32.8 pg (ref 26.0–34.0)
MCHC: 34.4 g/dL (ref 30.0–36.0)
MCV: 95.6 fL (ref 78.0–100.0)
Platelets: 86 10*3/uL — ABNORMAL LOW (ref 150–400)
RBC: 3.38 MIL/uL — ABNORMAL LOW (ref 4.22–5.81)
RDW: 18.5 % — AB (ref 11.5–15.5)
WBC: 4 10*3/uL (ref 4.0–10.5)

## 2015-11-09 LAB — CALCIUM, IONIZED: CALCIUM, IONIZED, SERUM: 4.8 mg/dL (ref 4.5–5.6)

## 2015-11-09 LAB — LACTIC ACID, PLASMA: LACTIC ACID, VENOUS: 2.4 mmol/L — AB (ref 0.5–2.0)

## 2015-11-09 LAB — AMMONIA: Ammonia: 23 umol/L (ref 9–35)

## 2015-11-09 LAB — MAGNESIUM: Magnesium: 1.5 mg/dL — ABNORMAL LOW (ref 1.7–2.4)

## 2015-11-09 LAB — MRSA PCR SCREENING: MRSA BY PCR: POSITIVE — AB

## 2015-11-09 MED ORDER — LORAZEPAM 2 MG/ML IJ SOLN
1.0000 mg | INTRAMUSCULAR | Status: DC | PRN
  Administered 2015-11-09 – 2015-11-10 (×2): 2 mg via INTRAVENOUS
  Filled 2015-11-09 (×2): qty 1

## 2015-11-09 MED ORDER — SODIUM CHLORIDE 0.9 % IV BOLUS (SEPSIS): 1.5000 mL | Freq: Once | INTRAVENOUS | Status: DC

## 2015-11-09 MED ORDER — POTASSIUM CHLORIDE CRYS ER 20 MEQ PO TBCR
40.0000 meq | EXTENDED_RELEASE_TABLET | Freq: Once | ORAL | Status: DC
  Filled 2015-11-09: qty 2

## 2015-11-09 MED ORDER — PIPERACILLIN-TAZOBACTAM 3.375 G IVPB 30 MIN
3.3750 g | Freq: Once | INTRAVENOUS | Status: AC
  Administered 2015-11-09: 3.375 g via INTRAVENOUS
  Filled 2015-11-09: qty 50

## 2015-11-09 MED ORDER — SODIUM CHLORIDE 0.9 % IV SOLN
INTRAVENOUS | Status: DC
  Administered 2015-11-09 – 2015-11-10 (×2): via INTRAVENOUS

## 2015-11-09 MED ORDER — POTASSIUM CHLORIDE CRYS ER 20 MEQ PO TBCR: 40.0000 meq | EXTENDED_RELEASE_TABLET | Freq: Every day | ORAL | Status: DC

## 2015-11-09 MED ORDER — MAGNESIUM SULFATE 2 GM/50ML IV SOLN
2.0000 g | Freq: Once | INTRAVENOUS | Status: AC
  Administered 2015-11-09: 2 g via INTRAVENOUS
  Filled 2015-11-09: qty 50

## 2015-11-09 MED ORDER — SODIUM CHLORIDE 0.9 % IV BOLUS (SEPSIS)
1500.0000 mL | Freq: Once | INTRAVENOUS | Status: AC
  Administered 2015-11-09: 1500 mL via INTRAVENOUS

## 2015-11-09 MED ORDER — HALOPERIDOL LACTATE 5 MG/ML IJ SOLN
1.0000 mg | Freq: Once | INTRAMUSCULAR | Status: AC
  Administered 2015-11-09: 1 mg via INTRAVENOUS
  Filled 2015-11-09: qty 1

## 2015-11-09 MED ORDER — PIPERACILLIN-TAZOBACTAM 3.375 G IVPB
3.3750 g | Freq: Three times a day (TID) | INTRAVENOUS | Status: AC
  Administered 2015-11-10 – 2015-11-13 (×11): 3.375 g via INTRAVENOUS
  Filled 2015-11-09 (×12): qty 50

## 2015-11-09 NOTE — Progress Notes (Addendum)
Attempted to call for report at 1656 and 1712. Awaiting on call back.    5:25 PM Report given to receiving RN in [place]. Updated pt's brother on room change.   Teryl Lucy

## 2015-11-09 NOTE — Progress Notes (Signed)
RN gave pt his morning meds crushed in apple sauce. Pt spit out small amount of apple sauce. Pt is still very drowsy and resting in bed right now. Will continue to monitor.   Victor Clarke

## 2015-11-09 NOTE — Progress Notes (Signed)
ANTIBIOTIC CONSULT NOTE - INITIAL  Pharmacy Consult for Zosyn Indication: UTI  No Known Allergies  Patient Measurements: Height: 5\' 9"  (175.3 cm) Weight: 138 lb 14.4 oz (63.005 kg) IBW/kg (Calculated) : 70.7   Vital Signs: Temp: 101 F (38.3 C) (12/22 2005) Temp Source: Axillary (12/22 2005) BP: 137/79 mmHg (12/22 2005) Pulse Rate: 78 (12/22 2005) Intake/Output from previous day: 12/21 0701 - 12/22 0700 In: 630 [P.O.:480; I.V.:100; IV Piggyback:50] Out: 1030 [Urine:1030] Intake/Output from this shift:    Labs:  Recent Labs  11/07/15 1626 11/08/15 0530 11/08/15 0916 11/09/15 0600  WBC  --  4.9 5.0 4.0  HGB 12.9* 11.5* 11.5* 11.1*  PLT  --  99* 108* 86*  CREATININE 1.10  --  0.84 0.80   Estimated Creatinine Clearance: 61.3 mL/min (by C-G formula based on Cr of 0.8). No results for input(s): VANCOTROUGH, VANCOPEAK, VANCORANDOM, GENTTROUGH, GENTPEAK, GENTRANDOM, TOBRATROUGH, TOBRAPEAK, TOBRARND, AMIKACINPEAK, AMIKACINTROU, AMIKACIN in the last 72 hours.   Microbiology: Recent Results (from the past 720 hour(s))  Culture, Urine     Status: None (Preliminary result)   Collection Time: 11/07/15  9:55 PM  Result Value Ref Range Status   Specimen Description URINE, RANDOM  Final   Special Requests NONE  Final   Culture   Final    >=100,000 COLONIES/mL GRAM NEGATIVE RODS CULTURE REINCUBATED FOR BETTER GROWTH    Report Status PENDING  Incomplete    Medical History: Past Medical History  Diagnosis Date  . Hypertension   . Hypercholesteremia   . Gout   . COPD (chronic obstructive pulmonary disease) ([place])   . Detached retina     a. s/p surgical correction on the right.  . Prostate cancer ([place])     a. s/p TURP.  Marland Kitchen Ectopic atrial tachycardia ([place])     a. 08/2014 Echo: EF 55-60%, mildly dil LA.  . Parkinson's disease ([place])   . Left inguinal hernia     a. s/p repair ~ 10 yrs ago.   Assessment: 15 yom with acute encephalopathy secondary to alcohol withdrawal and  UTI. Previously on CTX, now Pharmacy consulted to dose Zosyn for UTI with GNR in UC, susceptibilities pending. Tmax/24h 101, wbc wnl, CrCl~61.  12/20 CTX>>12/22 12/22 Zosyn>>  12/20 UC>>GNR >100k col (S-pending) 12/22 BCx2>>  Goal of Therapy:  Eradication of infection  Plan:  Zosyn 3.375g IV (58min inf); then Zosyn 3.375g IV q8h Monitor clinical progress, c/s, renal function, abx plan/LOT  Elicia Lamp, PharmD, [facility] Clinical Pharmacist Pager [phone] 11/09/2015 9:37 PM

## 2015-11-09 NOTE — Progress Notes (Addendum)
PATIENT DETAILS Name: Victor Clarke Age: 81 y.o. Sex: male Date of Birth: [birth year] Admit Date: 11/07/2015 Admitting Physician Norval Morton, MD UQ:[id number] Celesta Gentile, MD  Subjective: Still very tremulous-more lethargic this morning. But easily arousable-follows commands.  Assessment/Plan: Principal Problem: Acute encephalopathy: Initially secondary to alcohol intoxication and UTI, now suspect with developing alcohol withdrawal/DTs. Continue Ativan per protocol.   CT head negative for acute abnormalities   Active Problems: UTI: Continue Rocephin. Follow cultures. Afebrile and without leukocytosis  Alcohol withdrawal:  still very tremulous-but has Parkinson's disease, slightly more lethargic than yesterday-but following commands. Continue Ativan per protocol. Watch closely.  Addendum 4pm: Still lethargic-but still awake/alert-will move to SDU for closer monitoring-as high risk for deterioration  Thrombocytopenia: Suspect secondary to alcohol use. Follow-will d/c prophylactic Lovenox   Hypomagnesemia: Continue to replete.  History of Parkinson's: Continue Sinemet  Hypertension: Controlled, continue lisinopril and Cardizem.  Dyslipidemia: Continue statin   Gout: No evidence of acute flare, continue allopurinol  Mechanical fall with facial laceration: Suspect secondary to alcohol intoxication, denies any loss of consciousness-however poor historian. Sutured in the emergency room, received tDaP. Await physical therapy evaluation-suspect may need SNF  Disposition: Remain inpatient-SNF on discharge  Antimicrobial agents  See below  Anti-infectives    Start     Dose/Rate Route Frequency Ordered Stop   11/08/15 2000  cefTRIAXone (ROCEPHIN) 1 g in dextrose 5 % 50 mL IVPB     1 g 100 mL/hr over 30 Minutes Intravenous Every 24 hours 11/08/15 1254     11/07/15 1945  cefTRIAXone (ROCEPHIN) 1 g in dextrose 5 % 50 mL IVPB     1 g 100 mL/hr over 30 Minutes  Intravenous  Once 11/07/15 1931 11/07/15 2032      DVT Prophylaxis: SCD'S-D/C Prophylactic Lovenox   Code Status: Full code  Family Communication 12/21>>William Prescher-brother-[phone]-lives in [place] 12/22>>-unable to leave a voicemail today   Procedures: None  CONSULTS:  None  Time spent 30 minutes-Greater than 50% of this time was spent in counseling, explanation of diagnosis, planning of further management, and coordination of care.  MEDICATIONS: Scheduled Meds: . allopurinol  100 mg Oral Daily  . aspirin EC  81 mg Oral Daily  . atorvastatin  40 mg Oral Daily  . carbidopa-levodopa  0.5 tablet Oral TID  . cefTRIAXone (ROCEPHIN)  IV  1 g Intravenous Q24H  . diltiazem  120 mg Oral Daily  . enoxaparin (LOVENOX) injection  40 mg Subcutaneous Q24H  . feeding supplement (ENSURE ENLIVE)  237 mL Oral TID BM  . folic acid  1 mg Oral Daily  . lisinopril  5 mg Oral Daily  . multivitamin with minerals  1 tablet Oral Daily  . omega-3 acid ethyl esters  1 g Oral Daily  . pneumococcal 23 valent vaccine  0.5 mL Intramuscular Tomorrow-1000  . thiamine  100 mg Oral Daily   Or  . thiamine  100 mg Intravenous Daily   Continuous Infusions: . sodium chloride 75 mL/hr at 11/09/15 1138   PRN Meds:.LORazepam **OR** LORazepam    PHYSICAL EXAM: Vital signs in last 24 hours: Filed Vitals:   11/08/15 2109 11/09/15 0602 11/09/15 0904 11/09/15 1223  BP: 101/66 125/61  133/58  Pulse: 64 60  67  Temp: 98.3 F (36.8 C) 98.1 F (36.7 C)  98.8 F (37.1 C)  TempSrc: Oral Oral  Oral  Resp: 18 18  18   Height:  Weight:      SpO2: 100% 87% 97% 98%    Weight change:  Filed Weights   11/08/15 0034  Weight: 63.005 kg (138 lb 14.4 oz)   Body mass index is 20.5 kg/(m^2).   Gen Exam: Slightly lethargic, but answers questions appropriately and follows commands.  Laceration with sutures-clots to the Forehead Neck: Supple, No JVD.   Chest: B/L Clear.   CVS: S1 S2 Regular, no  murmurs.  Abdomen: soft, BS +, non tender, non distended.  Extremities: no edema, lower extremities warm to touch. Neurologic: Non Focal-+ generalized weakness -+ rigidity/cogwheel  Skin: No Rash.   Wounds: N/A.   Intake/Output from previous day:  Intake/Output Summary (Last 24 hours) at 11/09/15 1343 Last data filed at 11/09/15 1000  Gross per 24 hour  Intake     30 ml  Output    600 ml  Net   -570 ml     LAB RESULTS: CBC  Recent Labs Lab 11/07/15 1617 11/07/15 1626 11/08/15 0530 11/08/15 0916 11/09/15 0600  WBC 4.8  --  4.9 5.0 4.0  HGB 11.9* 12.9* 11.5* 11.5* 11.1*  HCT 34.4* 38.0* 34.0* 33.5* 32.3*  PLT 105*  --  99* 108* 86*  MCV 96.4  --  97.1 97.1 95.6  MCH 33.3  --  32.9 33.3 32.8  MCHC 34.6  --  33.8 34.3 34.4  RDW 18.1*  --  18.2* 18.3* 18.5*  LYMPHSABS 0.9  --   --   --   --   MONOABS 0.3  --   --   --   --   EOSABS 0.2  --   --   --   --   BASOSABS 0.0  --   --   --   --     Chemistries   Recent Labs Lab 11/07/15 1617 11/07/15 1626 11/08/15 0109 11/08/15 0916 11/09/15 0600  NA 144 145  --  144 138  K 3.9 3.8  --  3.6 3.5  CL 110 107  --  111 108  CO2 21*  --   --  21* 17*  GLUCOSE 115* 110*  --  113* 92  BUN 19 22*  --  11 12  CREATININE 0.84 1.10  --  0.84 0.80  CALCIUM 8.7*  --   --  8.3* QUANTITY NOT SUFFICIENT, UNABLE TO PERFORM TEST  MG  --   --  1.2* 1.1* 1.5*    CBG:  Recent Labs Lab 11/07/15 1618  GLUCAP 101*    GFR Estimated Creatinine Clearance: 61.3 mL/min (by C-G formula based on Cr of 0.8).  Coagulation profile No results for input(s): INR, PROTIME in the last 168 hours.  Cardiac Enzymes No results for input(s): CKMB, TROPONINI, MYOGLOBIN in the last 168 hours.  Invalid input(s): CK  Invalid input(s): POCBNP No results for input(s): DDIMER in the last 72 hours. No results for input(s): HGBA1C in the last 72 hours. No results for input(s): CHOL, HDL, LDLCALC, TRIG, CHOLHDL, LDLDIRECT in the last 72 hours. No  results for input(s): TSH, T4TOTAL, T3FREE, THYROIDAB in the last 72 hours.  Invalid input(s): FREET3 No results for input(s): VITAMINB12, FOLATE, FERRITIN, TIBC, IRON, RETICCTPCT in the last 72 hours. No results for input(s): LIPASE, AMYLASE in the last 72 hours.  Urine Studies No results for input(s): UHGB, CRYS in the last 72 hours.  Invalid input(s): UACOL, UAPR, USPG, UPH, UTP, UGL, UKET, UBIL, UNIT, UROB, ULEU, UEPI, UWBC, URBC, UBAC, CAST, UCOM, BILUA  MICROBIOLOGY: Recent Results (from the past 240 hour(s))  Culture, Urine     Status: None (Preliminary result)   Collection Time: 11/07/15  9:55 PM  Result Value Ref Range Status   Specimen Description URINE, RANDOM  Final   Special Requests NONE  Final   Culture   Final    >=100,000 COLONIES/mL GRAM NEGATIVE RODS CULTURE REINCUBATED FOR BETTER GROWTH    Report Status PENDING  Incomplete    RADIOLOGY STUDIES/RESULTS: Ct Head Wo Contrast  11/07/2015  CLINICAL DATA:  Passed out today while cooking, now with cut on the nose. EXAM: CT HEAD WITHOUT CONTRAST CT MAXILLOFACIAL WITHOUT CONTRAST CT CERVICAL SPINE WITHOUT CONTRAST TECHNIQUE: Multidetector CT imaging of the head, cervical spine, and maxillofacial structures were performed using the standard protocol without intravenous contrast. Multiplanar CT image reconstructions of the cervical spine and maxillofacial structures were also generated. COMPARISON:  Head CT - 07/31/2015; 06/12/2015; 12/28/2014 FINDINGS: CT HEAD FINDINGS Similar findings of advanced atrophy with sulcal prominence centralized volume loss with commensurate ex vacuo dilatation of the ventricular system. Scattered periventricular hypodensities compatible microvascular ischemic disease. Bilateral basal ganglial calcifications. Given background parenchymal abnormalities, there is no CT evidence of superimposed acute large territory infarct. No intraparenchymal or extra-axial mass or hemorrhage. Unchanged size and  configuration of the ventricles and basilar cisterns. No midline shift. CT MAXILLOFACIAL FINDINGS Evaluation of the facial bones is degraded secondary to patient motion artifact. There is mild soft tissue swelling about the bridge of the nose with potential mildly displaced nasal bone fracture (image 67, series 5). Otherwise, no definite displaced facial fractures given limitation of patient motion. Normal appearance of the bilateral pterygoid plates. Normal appearance of the mandible. The bilateral mandibular condyles are normally located. Normal appearance of the bilateral zygomatic arches. Normal noncontrast appearance of the bilateral orbits and globes. Post left-sided cataract surgery. There is mild rightward nasal septal deviation. There is minimal mucosal thickening within the left maxillary sinus. The remaining paranasal sinuses and mastoid air cells are normally aerated. No air-fluid levels. CT CERVICAL SPINE FINDINGS Evaluation of cervical spine is minimally degraded secondary to patient motion artifact. C1 to the superior endplate of T1 is imaged. There is mild (approximately 3 mm) of presumably degenerative retrolisthesis of C4 upon C5. Otherwise, normal alignment of the cervical spine. No anterolisthesis or retrolisthesis. The bilateral facets are normally aligned. The dens is normally positioned between the lateral masses of C1. Normal atlantodental and atlantoaxial articulations. No fracture or static subluxation of the cervical spine. Cervical vertebral and heights are preserved. Prevertebral soft tissues are normal. Moderate to severe multilevel cervical spine DDD, likely worse at C4-C5 and to a lesser extent, C3-C4 and C5-C6 with disc space height loss, endplate irregularity and sclerosis. There is a suspected partial ankylosis involving the C4-C5 and C5-C6 intervertebral disc spaces. Atherosclerotic plaque within the bilateral carotid bulbs. No bulky cervical lymphadenopathy on this noncontrast  examination. Limited visualization lung apices demonstrates biapical bullous emphysematous change, right greater than left. IMPRESSION: 1. Advanced atrophy and microvascular ischemic disease without acute intracranial process. 2. Suspected minimally displaced nasal bone fracture. No additional facial fractures identified on this motion degraded examination. 3. Motion degraded examination without evidence of fracture or static subluxation of the cervical spine. 4. Moderate to severe multilevel cervical spine DDD. Electronically Signed   By: Sandi Mariscal M.D.   On: 11/07/2015 17:22   Ct Cervical Spine Wo Contrast  11/07/2015  CLINICAL DATA:  Passed out today while cooking, now with cut on the  nose. EXAM: CT HEAD WITHOUT CONTRAST CT MAXILLOFACIAL WITHOUT CONTRAST CT CERVICAL SPINE WITHOUT CONTRAST TECHNIQUE: Multidetector CT imaging of the head, cervical spine, and maxillofacial structures were performed using the standard protocol without intravenous contrast. Multiplanar CT image reconstructions of the cervical spine and maxillofacial structures were also generated. COMPARISON:  Head CT - 07/31/2015; 06/12/2015; 12/28/2014 FINDINGS: CT HEAD FINDINGS Similar findings of advanced atrophy with sulcal prominence centralized volume loss with commensurate ex vacuo dilatation of the ventricular system. Scattered periventricular hypodensities compatible microvascular ischemic disease. Bilateral basal ganglial calcifications. Given background parenchymal abnormalities, there is no CT evidence of superimposed acute large territory infarct. No intraparenchymal or extra-axial mass or hemorrhage. Unchanged size and configuration of the ventricles and basilar cisterns. No midline shift. CT MAXILLOFACIAL FINDINGS Evaluation of the facial bones is degraded secondary to patient motion artifact. There is mild soft tissue swelling about the bridge of the nose with potential mildly displaced nasal bone fracture (image 67, series 5).  Otherwise, no definite displaced facial fractures given limitation of patient motion. Normal appearance of the bilateral pterygoid plates. Normal appearance of the mandible. The bilateral mandibular condyles are normally located. Normal appearance of the bilateral zygomatic arches. Normal noncontrast appearance of the bilateral orbits and globes. Post left-sided cataract surgery. There is mild rightward nasal septal deviation. There is minimal mucosal thickening within the left maxillary sinus. The remaining paranasal sinuses and mastoid air cells are normally aerated. No air-fluid levels. CT CERVICAL SPINE FINDINGS Evaluation of cervical spine is minimally degraded secondary to patient motion artifact. C1 to the superior endplate of T1 is imaged. There is mild (approximately 3 mm) of presumably degenerative retrolisthesis of C4 upon C5. Otherwise, normal alignment of the cervical spine. No anterolisthesis or retrolisthesis. The bilateral facets are normally aligned. The dens is normally positioned between the lateral masses of C1. Normal atlantodental and atlantoaxial articulations. No fracture or static subluxation of the cervical spine. Cervical vertebral and heights are preserved. Prevertebral soft tissues are normal. Moderate to severe multilevel cervical spine DDD, likely worse at C4-C5 and to a lesser extent, C3-C4 and C5-C6 with disc space height loss, endplate irregularity and sclerosis. There is a suspected partial ankylosis involving the C4-C5 and C5-C6 intervertebral disc spaces. Atherosclerotic plaque within the bilateral carotid bulbs. No bulky cervical lymphadenopathy on this noncontrast examination. Limited visualization lung apices demonstrates biapical bullous emphysematous change, right greater than left. IMPRESSION: 1. Advanced atrophy and microvascular ischemic disease without acute intracranial process. 2. Suspected minimally displaced nasal bone fracture. No additional facial fractures  identified on this motion degraded examination. 3. Motion degraded examination without evidence of fracture or static subluxation of the cervical spine. 4. Moderate to severe multilevel cervical spine DDD. Electronically Signed   By: Sandi Mariscal M.D.   On: 11/07/2015 17:22   Ct Maxillofacial Wo Cm  11/07/2015  CLINICAL DATA:  Passed out today while cooking, now with cut on the nose. EXAM: CT HEAD WITHOUT CONTRAST CT MAXILLOFACIAL WITHOUT CONTRAST CT CERVICAL SPINE WITHOUT CONTRAST TECHNIQUE: Multidetector CT imaging of the head, cervical spine, and maxillofacial structures were performed using the standard protocol without intravenous contrast. Multiplanar CT image reconstructions of the cervical spine and maxillofacial structures were also generated. COMPARISON:  Head CT - 07/31/2015; 06/12/2015; 12/28/2014 FINDINGS: CT HEAD FINDINGS Similar findings of advanced atrophy with sulcal prominence centralized volume loss with commensurate ex vacuo dilatation of the ventricular system. Scattered periventricular hypodensities compatible microvascular ischemic disease. Bilateral basal ganglial calcifications. Given background parenchymal abnormalities, there is no CT  evidence of superimposed acute large territory infarct. No intraparenchymal or extra-axial mass or hemorrhage. Unchanged size and configuration of the ventricles and basilar cisterns. No midline shift. CT MAXILLOFACIAL FINDINGS Evaluation of the facial bones is degraded secondary to patient motion artifact. There is mild soft tissue swelling about the bridge of the nose with potential mildly displaced nasal bone fracture (image 67, series 5). Otherwise, no definite displaced facial fractures given limitation of patient motion. Normal appearance of the bilateral pterygoid plates. Normal appearance of the mandible. The bilateral mandibular condyles are normally located. Normal appearance of the bilateral zygomatic arches. Normal noncontrast appearance of the  bilateral orbits and globes. Post left-sided cataract surgery. There is mild rightward nasal septal deviation. There is minimal mucosal thickening within the left maxillary sinus. The remaining paranasal sinuses and mastoid air cells are normally aerated. No air-fluid levels. CT CERVICAL SPINE FINDINGS Evaluation of cervical spine is minimally degraded secondary to patient motion artifact. C1 to the superior endplate of T1 is imaged. There is mild (approximately 3 mm) of presumably degenerative retrolisthesis of C4 upon C5. Otherwise, normal alignment of the cervical spine. No anterolisthesis or retrolisthesis. The bilateral facets are normally aligned. The dens is normally positioned between the lateral masses of C1. Normal atlantodental and atlantoaxial articulations. No fracture or static subluxation of the cervical spine. Cervical vertebral and heights are preserved. Prevertebral soft tissues are normal. Moderate to severe multilevel cervical spine DDD, likely worse at C4-C5 and to a lesser extent, C3-C4 and C5-C6 with disc space height loss, endplate irregularity and sclerosis. There is a suspected partial ankylosis involving the C4-C5 and C5-C6 intervertebral disc spaces. Atherosclerotic plaque within the bilateral carotid bulbs. No bulky cervical lymphadenopathy on this noncontrast examination. Limited visualization lung apices demonstrates biapical bullous emphysematous change, right greater than left. IMPRESSION: 1. Advanced atrophy and microvascular ischemic disease without acute intracranial process. 2. Suspected minimally displaced nasal bone fracture. No additional facial fractures identified on this motion degraded examination. 3. Motion degraded examination without evidence of fracture or static subluxation of the cervical spine. 4. Moderate to severe multilevel cervical spine DDD. Electronically Signed   By: Sandi Mariscal M.D.   On: 11/07/2015 17:22    Oren Binet, MD  [facility] Pager:[phone]  If 7PM-7AM, please contact night-coverage [email or web address] Password [email or web address] 11/09/2015, 1:43 PM   LOS: 1 day

## 2015-11-09 NOTE — Progress Notes (Addendum)
CRITICAL VALUE ALERT  Critical value received:  Lactic Acid 2.4   Date of notification:  11/09/2015  Time of notification:  2310  Critical value read back:Yes.    Nurse who received alert:  Annice Pih  MD notified (1st page):  TRH  Time of first page:  2311  MD notified (2nd page):  Time of second page:  Responding MD:    Time MD responded:

## 2015-11-09 NOTE — Evaluation (Signed)
Occupational Therapy Evaluation Patient Details Name: Victor Clarke MRN: [id number] DOB: [birth year] Today's Date: 11/09/2015    History of Present Illness 81 yo male with fall prior to admission, has EtOH history and Parkinson's, noted to have acute encephalopathy.  New nose fracture from the fall with substantial facial lacerations.   Clinical Impression   Pt admitted with above. He demonstrates the below listed deficits and will benefit from continued OT to maximize safety and independence with BADLs.  Pt lethargic, eyes closed, restless and tremulous.  Does not follow commands and resists all attempts to move him - ? Withdrawal .  He requires total A for ADLs currently.  Recommend SNF at discharge.       Follow Up Recommendations  SNF    Equipment Recommendations  None recommended by OT    Recommendations for Other Services       Precautions / Restrictions Precautions Precautions: Fall      Mobility Bed Mobility Overal bed mobility: Needs Assistance Bed Mobility: Rolling Rolling: Total assist         General bed mobility comments: attempted to move pt to EOB, but pt resisting and unable to do so with total A  Transfers                 General transfer comment: unable     Balance                                            ADL                                         General ADL Comments: Pt is total assist with all aspects of ADLs      Vision Additional Comments: bruising bil. eyes, keeps eyes closed    Perception     Praxis      Pertinent Vitals/Pain Pain Assessment: Faces Faces Pain Scale: No hurt     Hand Dominance  (unsure)   Extremity/Trunk Assessment Upper Extremity Assessment Upper Extremity Assessment: Difficult to assess due to impaired cognition   Lower Extremity Assessment Lower Extremity Assessment: Difficult to assess due to impaired cognition   Cervical / Trunk Assessment Cervical  / Trunk Assessment: Kyphotic   Communication Communication Communication: Expressive difficulties (difficult to understand )   Cognition Arousal/Alertness: Lethargic Behavior During Therapy: Restless Overall Cognitive Status: Impaired/Different from baseline Area of Impairment: Orientation;Attention;Following commands Orientation Level: Disoriented to;Place;Time;Situation Current Attention Level: Focused   Following Commands:  (Pt followed no commands )       General Comments: Pt kept eyes closed throughout session.   States his name, does not follow commnads, states he is cold    General Comments       Exercises       Shoulder Instructions      Home Living Family/patient expects to be discharged to:: Unsure Living Arrangements: Alone                                      Prior Functioning/Environment Level of Independence: Independent        Comments: Per chart review, pt lived alone and was presumably independent with ADLs     OT  Diagnosis: Generalized weakness;Cognitive deficits   OT Problem List: Decreased strength;Decreased range of motion;Impaired balance (sitting and/or standing);Decreased activity tolerance;Decreased coordination;Decreased cognition;Decreased safety awareness;Decreased knowledge of use of DME or AE;Decreased knowledge of precautions   OT Treatment/Interventions: Self-care/ADL training;Therapeutic exercise;DME and/or AE instruction;Therapeutic activities;Cognitive remediation/compensation;Patient/family education;Balance training    OT Goals(Current goals can be found in the care plan section) Acute Rehab OT Goals OT Goal Formulation: Patient unable to participate in goal setting Time For Goal Achievement: 11/23/15 Potential to Achieve Goals: Fair ADL Goals Pt Will Perform Upper Body Bathing: with supervision;standing Pt Will Perform Lower Body Bathing: with mod assist;sit to/from stand Pt Will Perform Upper Body Dressing:  with min assist;sitting Pt Will Perform Lower Body Dressing: with mod assist;sit to/from stand Pt Will Transfer to Toilet: with min assist;ambulating;regular height toilet;bedside commode Pt Will Perform Toileting - Clothing Manipulation and hygiene: sit to/from stand;with mod assist Additional ADL Goal #1: Pt will follow one step commands consistently.   OT Frequency: Min 2X/week   Barriers to D/C: Decreased caregiver support          Co-evaluation              End of Session Nurse Communication: Other (comment) (cognitive status )  Activity Tolerance: Patient limited by lethargy Patient left: in bed;with call bell/phone within reach;with bed alarm set   Time: 1344-1356 OT Time Calculation (min): 12 min Charges:  OT General Charges $OT Visit: 1 Procedure OT Evaluation $Initial OT Evaluation Tier I: 1 Procedure G-Codes:    Future Yeldell, Ellard Artis M 30-Nov-2015, 2:18 PM

## 2015-11-09 NOTE — Significant Event (Signed)
Rapid Response Event Note Asked by primary RN to see pt for AMS.  Ongoing problem from earlier today Overview: Time Called: 2210 Arrival Time: 2212 Event Type: Neurologic  Initial Focused Assessment: Pt is lethargic, MAE, moans, but does not follow commands or open his eyes.  Lab at bedside obtaining blood.  VSS except t=101.  PERRL 2/brisk.  ABG obtained 7.447/27.2/59/7/18.2 and does not explain pt's condition. Recommend ammonia (ETOH abuse). Will cont. To monitor as necessary. Primary team aware.  Interventions:   Event Summary:   at      at          [facility], Victor Clarke
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
## 2015-11-10 ENCOUNTER — Inpatient Hospital Stay (HOSPITAL_COMMUNITY): Payer: Medicare Other

## 2015-11-10 DIAGNOSIS — E872 Acidosis: Secondary | ICD-10-CM

## 2015-11-10 DIAGNOSIS — J9600 Acute respiratory failure, unspecified whether with hypoxia or hypercapnia: Secondary | ICD-10-CM

## 2015-11-10 DIAGNOSIS — G9340 Encephalopathy, unspecified: Secondary | ICD-10-CM

## 2015-11-10 DIAGNOSIS — F10120 Alcohol abuse with intoxication, uncomplicated: Secondary | ICD-10-CM

## 2015-11-10 DIAGNOSIS — F10129 Alcohol abuse with intoxication, unspecified: Secondary | ICD-10-CM

## 2015-11-10 DIAGNOSIS — R401 Stupor: Secondary | ICD-10-CM

## 2015-11-10 DIAGNOSIS — J9602 Acute respiratory failure with hypercapnia: Secondary | ICD-10-CM | POA: Insufficient documentation

## 2015-11-10 DIAGNOSIS — F10929 Alcohol use, unspecified with intoxication, unspecified: Secondary | ICD-10-CM | POA: Insufficient documentation

## 2015-11-10 DIAGNOSIS — R4182 Altered mental status, unspecified: Secondary | ICD-10-CM

## 2015-11-10 LAB — COMPREHENSIVE METABOLIC PANEL
ALBUMIN: 3.3 g/dL — AB (ref 3.5–5.0)
ALT: 16 U/L — ABNORMAL LOW (ref 17–63)
AST: 40 U/L (ref 15–41)
Alkaline Phosphatase: 47 U/L (ref 38–126)
Anion gap: 18 — ABNORMAL HIGH (ref 5–15)
BILIRUBIN TOTAL: UNDETERMINED mg/dL (ref 0.3–1.2)
BUN: 9 mg/dL (ref 6–20)
CO2: 15 mmol/L — ABNORMAL LOW (ref 22–32)
Calcium: 8.3 mg/dL — ABNORMAL LOW (ref 8.9–10.3)
Chloride: 108 mmol/L (ref 101–111)
Creatinine, Ser: 0.83 mg/dL (ref 0.61–1.24)
GFR calc Af Amer: >60 mL/min (ref >60)
GFR calc non Af Amer: >60 mL/min (ref >60)
GLUCOSE: 94 mg/dL (ref 65–99)
POTASSIUM: 3.7 mmol/L (ref 3.5–5.1)
Sodium: 141 mmol/L (ref 135–145)
TOTAL PROTEIN: 6.5 g/dL (ref 6.5–8.1)

## 2015-11-10 LAB — CBC
HCT: 37.4 % — ABNORMAL LOW (ref 39.0–52.0)
HEMOGLOBIN: 12.5 g/dL — AB (ref 13.0–17.0)
MCH: 32.6 pg (ref 26.0–34.0)
MCHC: 33.4 g/dL (ref 30.0–36.0)
MCV: 97.7 fL (ref 78.0–100.0)
Platelets: 99 10*3/uL — ABNORMAL LOW (ref 150–400)
RBC: 3.83 MIL/uL — AB (ref 4.22–5.81)
RDW: 18.3 % — AB (ref 11.5–15.5)
WBC: 5.6 10*3/uL (ref 4.0–10.5)

## 2015-11-10 LAB — ETHANOL

## 2015-11-10 LAB — BLOOD GAS, ARTERIAL
ACID-BASE DEFICIT: 9.9 mmol/L — AB (ref 0.0–2.0)
Acid-base deficit: 4.9 mmol/L — ABNORMAL HIGH (ref 0.0–2.0)
BICARBONATE: 18.2 meq/L — AB (ref 20.0–24.0)
BICARBONATE: 16 meq/L — AB (ref 20.0–24.0)
DRAWN BY: 365271
Drawn by: 365271
FIO2: 0.21
FIO2: 0.28
O2 SAT: 88.6 %
O2 SAT: 84.2 %
PATIENT TEMPERATURE: 101
PATIENT TEMPERATURE: 98.6
PCO2 ART: 38 mmHg (ref 35.0–45.0)
PO2 ART: 59.7 mmHg — AB (ref 80.0–100.0)
PO2 ART: 59.8 mmHg — AB (ref 80.0–100.0)
TCO2: 19 mmol/L (ref 0–100)
TCO2: 17.1 mmol/L (ref 0–100)
pCO2 arterial: 27.2 mmHg — ABNORMAL LOW (ref 35.0–45.0)
pH, Arterial: 7.447 (ref 7.350–7.450)
pH, Arterial: 7.247 — ABNORMAL LOW (ref 7.350–7.450)

## 2015-11-10 LAB — GLUCOSE, CAPILLARY
GLUCOSE-CAPILLARY: 105 mg/dL — AB (ref 65–99)
Glucose-Capillary: 117 mg/dL — ABNORMAL HIGH (ref 65–99)
Glucose-Capillary: 125 mg/dL — ABNORMAL HIGH (ref 65–99)
Glucose-Capillary: 87 mg/dL (ref 65–99)

## 2015-11-10 LAB — POCT I-STAT 3, ART BLOOD GAS (G3+)
ACID-BASE DEFICIT: 7 mmol/L — AB (ref 0.0–2.0)
BICARBONATE: 17.9 meq/L — AB (ref 20.0–24.0)
O2 Saturation: 100 %
PH ART: 7.316 — AB (ref 7.350–7.450)
PO2 ART: 397 mmHg — AB (ref 80.0–100.0)
TCO2: 19 mmol/L (ref 0–100)
pCO2 arterial: 35.1 mmHg (ref 35.0–45.0)

## 2015-11-10 LAB — PHOSPHORUS: Phosphorus: 3.3 mg/dL (ref 2.5–4.6)

## 2015-11-10 LAB — INFLUENZA PANEL BY PCR (TYPE A & B)
H1N1FLUPCR: NOT DETECTED
INFLAPCR: NEGATIVE
INFLBPCR: NEGATIVE

## 2015-11-10 LAB — TSH
TSH: 1.656 u[IU]/mL (ref 0.350–4.500)
TSH: 1.476 u[IU]/mL (ref 0.350–4.500)

## 2015-11-10 LAB — MAGNESIUM
MAGNESIUM: 1.6 mg/dL — AB (ref 1.7–2.4)
MAGNESIUM: 2.4 mg/dL (ref 1.7–2.4)
Magnesium: UNDETERMINED mg/dL (ref 1.7–2.4)

## 2015-11-10 LAB — CORTISOL
CORTISOL PLASMA: 12.4 ug/dL
Cortisol, Plasma: 10.3 ug/dL

## 2015-11-10 LAB — LACTIC ACID, PLASMA
LACTIC ACID, VENOUS: 1.3 mmol/L (ref 0.5–2.0)
Lactic Acid, Venous: 2.2 mmol/L (ref 0.5–2.0)
Lactic Acid, Venous: 1 mmol/L (ref 0.5–2.0)

## 2015-11-10 LAB — OSMOLALITY: OSMOLALITY: 287 mosm/kg (ref 275–295)

## 2015-11-10 MED ORDER — PRO-STAT SUGAR FREE PO LIQD
30.0000 mL | Freq: Two times a day (BID) | ORAL | Status: DC
  Administered 2015-11-10 – 2015-11-11 (×2): 30 mL
  Filled 2015-11-10 (×3): qty 30

## 2015-11-10 MED ORDER — LORAZEPAM 2 MG/ML IJ SOLN
2.0000 mg | Freq: Three times a day (TID) | INTRAMUSCULAR | Status: DC
  Administered 2015-11-10 – 2015-11-13 (×8): 2 mg via INTRAVENOUS
  Filled 2015-11-10 (×8): qty 1

## 2015-11-10 MED ORDER — DILTIAZEM 12 MG/ML ORAL SUSPENSION
30.0000 mg | Freq: Four times a day (QID) | ORAL | Status: DC
  Administered 2015-11-10 – 2015-11-12 (×4): 30 mg via ORAL
  Filled 2015-11-10 (×13): qty 3

## 2015-11-10 MED ORDER — MIDAZOLAM HCL 2 MG/2ML IJ SOLN
4.0000 mg | Freq: Once | INTRAMUSCULAR | Status: AC
  Administered 2015-11-10: 4 mg via INTRAVENOUS

## 2015-11-10 MED ORDER — HYDROCORTISONE NA SUCCINATE PF 100 MG IJ SOLR
50.0000 mg | Freq: Four times a day (QID) | INTRAMUSCULAR | Status: DC
  Administered 2015-11-10 – 2015-11-11 (×4): 50 mg via INTRAVENOUS
  Filled 2015-11-10 (×2): qty 1
  Filled 2015-11-10: qty 2
  Filled 2015-11-10 (×2): qty 1
  Filled 2015-11-10: qty 2

## 2015-11-10 MED ORDER — HEPARIN SODIUM (PORCINE) 5000 UNIT/ML IJ SOLN
5000.0000 [IU] | Freq: Three times a day (TID) | INTRAMUSCULAR | Status: DC
  Administered 2015-11-10 – 2015-11-20 (×28): 5000 [IU] via SUBCUTANEOUS
  Filled 2015-11-10 (×33): qty 1

## 2015-11-10 MED ORDER — FOLIC ACID 1 MG PO TABS
1.0000 mg | ORAL_TABLET | Freq: Every day | ORAL | Status: DC
  Administered 2015-11-10 – 2015-11-11 (×2): 1 mg
  Filled 2015-11-10 (×2): qty 1

## 2015-11-10 MED ORDER — SODIUM CHLORIDE 0.9 % IV BOLUS (SEPSIS)
500.0000 mL | Freq: Once | INTRAVENOUS | Status: AC
  Administered 2015-11-10: 500 mL via INTRAVENOUS

## 2015-11-10 MED ORDER — ATORVASTATIN CALCIUM 40 MG PO TABS
40.0000 mg | ORAL_TABLET | Freq: Every day | ORAL | Status: DC
  Administered 2015-11-10 – 2015-11-17 (×8): 40 mg
  Filled 2015-11-10 (×8): qty 1

## 2015-11-10 MED ORDER — MAGNESIUM SULFATE 2 GM/50ML IV SOLN
2.0000 g | Freq: Once | INTRAVENOUS | Status: AC
  Administered 2015-11-10: 2 g via INTRAVENOUS
  Filled 2015-11-10: qty 50

## 2015-11-10 MED ORDER — SODIUM CHLORIDE 0.9 % IV BOLUS (SEPSIS)
750.0000 mL | Freq: Once | INTRAVENOUS | Status: AC
  Administered 2015-11-10: 750 mL via INTRAVENOUS

## 2015-11-10 MED ORDER — MIDAZOLAM HCL 2 MG/2ML IJ SOLN
2.0000 mg | INTRAMUSCULAR | Status: DC | PRN
  Administered 2015-11-10 – 2015-11-11 (×3): 2 mg via INTRAVENOUS
  Filled 2015-11-10 (×3): qty 2

## 2015-11-10 MED ORDER — ANTISEPTIC ORAL RINSE SOLUTION (CORINZ)
7.0000 mL | Freq: Four times a day (QID) | OROMUCOSAL | Status: DC
  Administered 2015-11-10 – 2015-11-16 (×22): 7 mL via OROMUCOSAL

## 2015-11-10 MED ORDER — LISINOPRIL 5 MG PO TABS
5.0000 mg | ORAL_TABLET | Freq: Every day | ORAL | Status: DC
  Administered 2015-11-10: 5 mg
  Filled 2015-11-10: qty 1

## 2015-11-10 MED ORDER — CHLORHEXIDINE GLUCONATE 0.12% ORAL RINSE (MEDLINE KIT)
15.0000 mL | Freq: Two times a day (BID) | OROMUCOSAL | Status: DC
  Administered 2015-11-10 – 2015-11-16 (×12): 15 mL via OROMUCOSAL

## 2015-11-10 MED ORDER — INSULIN ASPART 100 UNIT/ML ~~LOC~~ SOLN
0.0000 [IU] | SUBCUTANEOUS | Status: DC
  Administered 2015-11-10: 1 [IU] via SUBCUTANEOUS
  Administered 2015-11-11: 2 [IU] via SUBCUTANEOUS
  Administered 2015-11-11 (×2): 1 [IU] via SUBCUTANEOUS
  Administered 2015-11-11 (×3): 2 [IU] via SUBCUTANEOUS
  Administered 2015-11-12 – 2015-11-15 (×7): 1 [IU] via SUBCUTANEOUS
  Administered 2015-11-15: 2 [IU] via SUBCUTANEOUS
  Administered 2015-11-15: 1 [IU] via SUBCUTANEOUS
  Administered 2015-11-16 (×2): 2 [IU] via SUBCUTANEOUS
  Administered 2015-11-16: 1 [IU] via SUBCUTANEOUS
  Administered 2015-11-16: 2 [IU] via SUBCUTANEOUS
  Administered 2015-11-16: 1 [IU] via SUBCUTANEOUS
  Administered 2015-11-16: 2 [IU] via SUBCUTANEOUS
  Administered 2015-11-17 – 2015-11-20 (×6): 1 [IU] via SUBCUTANEOUS

## 2015-11-10 MED ORDER — THIAMINE HCL 100 MG/ML IJ SOLN
100.0000 mg | Freq: Two times a day (BID) | INTRAMUSCULAR | Status: DC
  Administered 2015-11-10 – 2015-11-12 (×4): 100 mg via INTRAVENOUS
  Filled 2015-11-10 (×7): qty 1

## 2015-11-10 MED ORDER — ADULT MULTIVITAMIN W/MINERALS CH
1.0000 | ORAL_TABLET | Freq: Every day | ORAL | Status: DC
  Administered 2015-11-10 – 2015-11-16 (×7): 1
  Filled 2015-11-10 (×8): qty 1

## 2015-11-10 MED ORDER — DEXMEDETOMIDINE HCL IN NACL 200 MCG/50ML IV SOLN
0.4000 ug/kg/h | INTRAVENOUS | Status: DC
  Administered 2015-11-10 (×2): 0.8 ug/kg/h via INTRAVENOUS
  Administered 2015-11-10: 0.4 ug/kg/h via INTRAVENOUS
  Filled 2015-11-10: qty 50
  Filled 2015-11-10: qty 100
  Filled 2015-11-10 (×2): qty 50

## 2015-11-10 MED ORDER — PANTOPRAZOLE SODIUM 40 MG IV SOLR
40.0000 mg | Freq: Every day | INTRAVENOUS | Status: DC
  Administered 2015-11-10 – 2015-11-12 (×3): 40 mg via INTRAVENOUS
  Filled 2015-11-10 (×3): qty 40

## 2015-11-10 MED ORDER — ETOMIDATE 2 MG/ML IV SOLN
20.0000 mg | Freq: Once | INTRAVENOUS | Status: AC
  Administered 2015-11-10: 20 mg via INTRAVENOUS
  Filled 2015-11-10: qty 10

## 2015-11-10 MED ORDER — MUPIROCIN 2 % EX OINT
1.0000 "application " | TOPICAL_OINTMENT | Freq: Two times a day (BID) | CUTANEOUS | Status: AC
  Administered 2015-11-10 – 2015-11-14 (×10): 1 via NASAL
  Filled 2015-11-10 (×3): qty 22

## 2015-11-10 MED ORDER — FENTANYL CITRATE (PF) 100 MCG/2ML IJ SOLN
25.0000 ug | INTRAMUSCULAR | Status: DC | PRN
  Administered 2015-11-10: 25 ug via INTRAVENOUS
  Administered 2015-11-11 (×2): 50 ug via INTRAVENOUS
  Filled 2015-11-10 (×3): qty 2

## 2015-11-10 MED ORDER — CHLORHEXIDINE GLUCONATE CLOTH 2 % EX PADS
6.0000 | MEDICATED_PAD | Freq: Every day | CUTANEOUS | Status: AC
  Administered 2015-11-10 – 2015-11-14 (×5): 6 via TOPICAL

## 2015-11-10 MED ORDER — CARBIDOPA-LEVODOPA 25-100 MG PO TABS
0.5000 | ORAL_TABLET | Freq: Three times a day (TID) | ORAL | Status: DC
  Administered 2015-11-10 – 2015-11-17 (×16): 0.5
  Filled 2015-11-10 (×25): qty 0.5

## 2015-11-10 MED ORDER — DOPAMINE-DEXTROSE 3.2-5 MG/ML-% IV SOLN
5.0000 ug/kg/min | INTRAVENOUS | Status: DC
  Administered 2015-11-10: 5 ug/kg/min via INTRAVENOUS
  Filled 2015-11-10: qty 250

## 2015-11-10 MED ORDER — ROCURONIUM BROMIDE 50 MG/5ML IV SOLN
40.0000 mg | Freq: Once | INTRAVENOUS | Status: AC
  Administered 2015-11-10: 40 mg via INTRAVENOUS
  Filled 2015-11-10: qty 4

## 2015-11-10 MED ORDER — VITAL HIGH PROTEIN PO LIQD
1000.0000 mL | ORAL | Status: DC
  Administered 2015-11-10: 1000 mL
  Administered 2015-11-11: 10:00:00
  Administered 2015-11-11: 1000 mL
  Filled 2015-11-10 (×3): qty 1000

## 2015-11-10 MED ORDER — ASPIRIN 81 MG PO CHEW
81.0000 mg | CHEWABLE_TABLET | Freq: Every day | ORAL | Status: DC
  Administered 2015-11-10 – 2015-11-17 (×8): 81 mg
  Filled 2015-11-10 (×8): qty 1

## 2015-11-10 NOTE — Consult Note (Signed)
PULMONARY / CRITICAL CARE MEDICINE   Name: Victor Clarke MRN: [id number] DOB: 1929/06/27    ADMISSION DATE:  11/07/2015 CONSULTATION DATE:  11/07/2015  REFERRING MD:  Dr. Sloan Leiter  CHIEF COMPLAINT:  AMS  HISTORY OF PRESENT ILLNESS:   81 year old male with PMH as below, which includes HTN, HLD, COPD, and Parkinson's. He also has a history of ETOH abuse and has presented to ED several times recently for fall and was found to be intoxicated each time. 12/20 he again presented to [facility] ED s/p falling down steps at home and was found down for unknown time. He was altered and intoxicated in ED. He had lacerations to the front of his scalp and forehead. UA was suspicious for UTI and he was started on ABX. CT head/cspine was obtained and showed no acute intracranial issues. Minimally displaced nasal bone fracture. No C-Spine fracture. He was improved to the hospitalis team and was placed on CIWA protocol. His encephalopathy was much improved the following day 12/21. 12/22 he was becoming more lethargic and was moved to SDU for closer monitoring, however he was still alert and able to follow commands. As the night went on his mental status progressively diminished and his respiratory status decline. PCCM consulted for further evaluation.   PAST MEDICAL HISTORY :  He  has a past medical history of Hypertension; Hypercholesteremia; Gout; COPD (chronic obstructive pulmonary disease) ([place]); Detached retina; Prostate cancer ([place]); Ectopic atrial tachycardia ([place]); Parkinson's disease ([place]); and Left inguinal hernia.  PAST SURGICAL HISTORY: He  has past surgical history that includes Colonoscopy w/ polypectomy (2001, 2006); Hernia repair; and Prostate surgery.  No Known Allergies  No current facility-administered medications on file prior to encounter.   Current Outpatient Prescriptions on File Prior to Encounter  Medication Sig  . allopurinol (ZYLOPRIM) 100 MG tablet Take 100 mg by mouth daily.   Marland Kitchen  aspirin EC 81 MG tablet Take 81 mg by mouth daily.  Marland Kitchen atorvastatin (LIPITOR) 40 MG tablet Take 40 mg by mouth daily.  . carbidopa-levodopa (SINEMET IR) 25-100 MG per tablet Take 0.5 tablets by mouth 3 (three) times daily.  Marland Kitchen diltiazem (CARDIZEM CD) 120 MG 24 hr capsule Take 1 capsule (120 mg total) by mouth daily.  Marland Kitchen lisinopril (PRINIVIL,ZESTRIL) 5 MG tablet Take 1 tablet (5 mg total) by mouth daily.  . Omega-3 Fatty Acids (FISH OIL) 1200 MG CAPS Take 2,400 mg by mouth daily.  . feeding supplement, ENSURE ENLIVE, (ENSURE ENLIVE) LIQD Take 237 mLs by mouth 3 (three) times daily between meals.    FAMILY HISTORY:  His indicated that his mother is deceased. He indicated that his father is deceased.   SOCIAL HISTORY: He  reports that he quit smoking about 38 years ago. His smoking use included Cigarettes. He has a 44 pack-year smoking history. He has never used smokeless tobacco. He reports that he drinks about 0.6 oz of alcohol per week. He reports that he does not use illicit drugs.  REVIEW OF SYSTEMS:   Unable  SUBJECTIVE:    VITAL SIGNS: BP 129/76 mmHg  Pulse 93  Temp(Src) 99.2 F (37.3 C) (Axillary)  Resp 28  Ht 5\' 9"  (1.753 m)  Wt 63.005 kg (138 lb 14.4 oz)  BMI 20.50 kg/m2  SpO2 97%  HEMODYNAMICS:    VENTILATOR SETTINGS:    INTAKE / OUTPUT: I/O last 3 completed shifts: In: 57 [P.O.:510; I.V.:100; IV Piggyback:50] Out: 1030 [Urine:1030]  PHYSICAL EXAMINATION: General:  Elderly, chronically ill appearing male in  moderate respiratory distress Neuro:  Minimally responsive to painful stimuli HEENT:  Ecchymosis to forehead and bilateral periorbits.  Cardiovascular:  Tachy, regular, no MRG Lungs:  Coarse bilateral breath sounds, paradoxical respirations, accessory muscle use. Abdomen:  Soft, non-distended Musculoskeletal:  No acute deformity Skin:  Grossly intact  LABS:  BMET  Recent Labs Lab 11/09/15 0600 11/09/15 2200 11/10/15 0111  NA 138 140 141  K 3.5  3.5 3.7  CL 108 106 108  CO2 17* 21* 15*  BUN 12 9 9   CREATININE 0.80 0.90 0.83  GLUCOSE 92 92 94    Electrolytes  Recent Labs Lab 11/08/15 0109 11/08/15 0916 11/09/15 0600 11/09/15 2200 11/10/15 0111  CALCIUM  --  8.3* QUANTITY NOT SUFFICIENT, UNABLE TO PERFORM TEST 8.8* 8.3*  MG 1.2* 1.1* 1.5*  --  QUANTITY NOT SUFFICIENT, UNABLE TO PERFORM TEST  PHOS 4.1  --   --   --   --     CBC  Recent Labs Lab 11/09/15 0600 11/09/15 2200 11/10/15 0111  WBC 4.0 5.1 5.6  HGB 11.1* 12.9* 12.5*  HCT 32.3* 38.5* 37.4*  PLT 86* 107* 99*    Coag's No results for input(s): APTT, INR in the last 168 hours.  Sepsis Markers  Recent Labs Lab 11/08/15 0530 11/09/15 2200 11/10/15 0111  LATICACIDVEN 1.3 2.4* 2.2*    ABG  Recent Labs Lab 11/09/15 2200 11/10/15 0338  PHART 7.447 7.247*  PCO2ART 27.2* 38.0  PO2ART 59.7* 59.8*    Liver Enzymes  Recent Labs Lab 11/08/15 0916 11/09/15 2200 11/10/15 0111  AST 50* 42* 40  ALT 11* 13* 16*  ALKPHOS 47 48 47  BILITOT 1.0 1.1 QUANTITY NOT SUFFICIENT, UNABLE TO PERFORM TEST  ALBUMIN 3.1* 3.5 3.3*    Cardiac Enzymes No results for input(s): TROPONINI, PROBNP in the last 168 hours.  Glucose  Recent Labs Lab 11/07/15 1618  GLUCAP 101*    Imaging Ct Head Wo Contrast  11/10/2015  CLINICAL DATA:  Altered mental status EXAM: CT HEAD WITHOUT CONTRAST TECHNIQUE: Contiguous axial images were obtained from the base of the skull through the vertex without intravenous contrast. COMPARISON:  11/07/2015 FINDINGS: Skull and Sinuses:Central forehead swelling without underlying osseous abnormality. Re- demonstrated central nasal arch fracture without progressed displacement. Visualized orbits: Cataract resection on the left and probable glaucoma reservoir on the right. Brain: No evidence of acute infarction, hemorrhage, hydrocephalus, or mass lesion/mass effect. Generalized atrophy with ventriculomegaly. Chronic small vessel disease  with mild ischemic gliosis around the lateral ventricles. IMPRESSION: 1. No acute intracranial finding. 2. Atrophy and chronic small vessel disease. 3. Forehead contusion and known nasal arch fracture. Electronically Signed   By: Monte Fantasia M.D.   On: 11/10/2015 05:07   Dg Chest Port 1 View  11/09/2015  CLINICAL DATA:  Fever. EXAM: PORTABLE CHEST 1 VIEW COMPARISON:  08/05/2015 FINDINGS: Cardiomediastinal silhouette is normal. Mediastinal contours appear intact. There is no evidence of focal airspace consolidation, pleural effusion or pneumothorax. There is diffuse thickening of the interstitial markings, worse than on the prior radiograph. Osseous structures are without acute abnormality. Soft tissues are grossly normal. IMPRESSION: Diffuse thickening of the interstitial markings, which may be seen with chronic interstitial lung disease. An element of interstitial pulmonary edema cannot be excluded. Electronically Signed   By: Fidela Salisbury M.D.   On: 11/09/2015 21:10     STUDIES:  CT head, C-spine 12/20 > Advanced atrophy and microvascular ischemic disease without acute intracranial process. Suspected minimally displaced nasal bone fracture.  No additional facial fractures identified on this motion degraded examination. Motion degraded examination without evidence of fracture or static subluxation of the cervical spine. Moderate to severe multilevel cervical spine DDD. CT head 12/23 > No acute intracranial finding. Atrophy and chronic small vessel disease. Forehead contusion and known nasal arch fracture.  CULTURES: Blood 12/20 > Urine 12/20 > GNR >>> Tracheal aspirate 12/23 >>>  ANTIBIOTICS: Zosyn 12/20 >>>  SIGNIFICANT EVENTS: 12/20 fall, admit 12/22 tx to SDU for increasing lethargy 12/23 worsening MS, tx to ICU for intubation  LINES/TUBES: ETT 12/23 >>>  ASSESSMENT / PLAN:  PULMONARY A: Acute hypoxemic respiratory failure COPD without evidence of acute  exacerbation  P:   STAT intubation Full vent support CXR for ETT placement ABG Scheduled nebulized BDs Vent Bundle  CARDIOVASCULAR A:  H/o HTN, HLD  P:  Telemetry Holding home antihypertensives while NPO  RENAL A:   High AG metabolic acidosis Hypomagnesemia   P:   Trend lactic acid to ensure clearing as WOB improves Repeat Mag, replete if indicated Follow BMP  GASTROINTESTINAL A:   No acute issues  P:   NPO Protonix for SUP  HEMATOLOGIC A:   Thrombocytopenia, chronic - suspect secondary to alcohol abuse. Mild anemia (Hgb at baseline) H/o prostate Ca s/p TURP  P:  Follow CBC Heparin SQ for VTE ppx  INFECTIOUS A:   Sepsis secondary to UTI  P:   ABX and cultures as above PCT Trend WBC and fever curve  ENDOCRINE A:   No acute issues   P:   Follow glucose on BMP  NEUROLOGIC A:   Acute metabolic encephalopathy Alcohol withdrawal  P:   RASS goal: -1 Precedex infusion for ICU CIWA protocol and medical sedation Fenatynyl IV PRN Repeat alcohol level Serum Osm IV thiamine, folate  FAMILY  - Updates:   - Inter-disciplinary family meet or Palliative Care meeting due by:  11/17/2015  Georgann Housekeeper, AGACNP-BC [place] [facility] Pager [phone] or [phone]  11/10/2015 6:02 AM

## 2015-11-10 NOTE — Consult Note (Signed)
PULMONARY / CRITICAL CARE MEDICINE   Name: Victor Clarke MRN: [id number] DOB: 1929-02-27    ADMISSION DATE:  11/07/2015 CONSULTATION DATE:  11/07/2015  REFERRING MD:  Dr. Sloan Leiter  CHIEF COMPLAINT:  AMS  HISTORY OF PRESENT ILLNESS:   81 year old male with PMH as below, which includes HTN, HLD, COPD, and Parkinson's. He also has a history of ETOH abuse and has presented to ED several times recently for fall and was found to be intoxicated each time. 12/20 he again presented to [facility] ED s/p falling down steps at home and was found down for unknown time. He was altered and intoxicated in ED. He had lacerations to the front of his scalp and forehead. UA was suspicious for UTI and he was started on ABX. CT head/cspine was obtained and showed no acute intracranial issues. Minimally displaced nasal bone fracture. No C-Spine fracture. He was improved to the hospitalis team and was placed on CIWA protocol. His encephalopathy was much improved the following day 12/21. 12/22 he was becoming more lethargic and was moved to SDU for closer monitoring, however he was still alert and able to follow commands. As the night went on his mental status progressively diminished and his respiratory status decline. PCCM consulted for further evaluation.   SUBJECTIVE: vented low BP this afternoon   VITAL SIGNS: BP 81/53 mmHg  Pulse 63  Temp(Src) 98.6 F (37 C) (Oral)  Resp 15  Ht 5\' 9"  (1.753 m)  Wt 66.2 kg (145 lb 15.1 oz)  BMI 21.54 kg/m2  SpO2 100%  HEMODYNAMICS:    VENTILATOR SETTINGS: Vent Mode:  [-] PRVC FiO2 (%):  [40 %-100 %] 40 % Set Rate:  [15 bmp] 15 bmp Vt Set:  [570 mL] 570 mL PEEP:  [5 cmH20] 5 cmH20 Plateau Pressure:  [14 cmH20-22 cmH20] 15 cmH20  INTAKE / OUTPUT: I/O last 3 completed shifts: In: 1382.5 [P.O.:30; I.V.:1302.5; IV Piggyback:50] Out: 1700 [Urine:1700]  PHYSICAL EXAMINATION: General:  Elderly, chronically ill appearing male on vent Neuro:  Minimally responsive  rass  -3 HEENT:  Ecchymosis to forehead and bilateral periorbits.  Cardiovascular:  s1 s2, regular, no MRG Lungs:  ronchi Abdomen:  Soft, no r/g, non-distended Musculoskeletal:  No acute deformity Skin:  Grossly intact  LABS:  BMET  Recent Labs Lab 11/09/15 0600 11/09/15 2200 11/10/15 0111  NA 138 140 141  K 3.5 3.5 3.7  CL 108 106 108  CO2 17* 21* 15*  BUN 12 9 9   CREATININE 0.80 0.90 0.83  GLUCOSE 92 92 94    Electrolytes  Recent Labs Lab 11/08/15 0109  11/09/15 0600 11/09/15 2200 11/10/15 0111 11/10/15 0354  CALCIUM  --   < > QUANTITY NOT SUFFICIENT, UNABLE TO PERFORM TEST 8.8* 8.3*  --   MG 1.2*  < > 1.5*  --  QUANTITY NOT SUFFICIENT, UNABLE TO PERFORM TEST 1.6*  PHOS 4.1  --   --   --   --   --   < > = values in this interval not displayed.  CBC  Recent Labs Lab 11/09/15 0600 11/09/15 2200 11/10/15 0111  WBC 4.0 5.1 5.6  HGB 11.1* 12.9* 12.5*  HCT 32.3* 38.5* 37.4*  PLT 86* 107* 99*    Coag's No results for input(s): APTT, INR in the last 168 hours.  Sepsis Markers  Recent Labs Lab 11/09/15 2200 11/10/15 0111 11/10/15 1328  LATICACIDVEN 2.4* 2.2* 1.0    ABG  Recent Labs Lab 11/09/15 2200 11/10/15 0338 11/10/15 0746  PHART 7.447 7.247* 7.316*  PCO2ART 27.2* 38.0 35.1  PO2ART 59.7* 59.8* 397.0*    Liver Enzymes  Recent Labs Lab 11/08/15 0916 11/09/15 2200 11/10/15 0111  AST 50* 42* 40  ALT 11* 13* 16*  ALKPHOS 47 48 47  BILITOT 1.0 1.1 QUANTITY NOT SUFFICIENT, UNABLE TO PERFORM TEST  ALBUMIN 3.1* 3.5 3.3*    Cardiac Enzymes No results for input(s): TROPONINI, PROBNP in the last 168 hours.  Glucose  Recent Labs Lab 11/07/15 1618 11/10/15 0757 11/10/15 1137  GLUCAP 101* 117* 125*    Imaging Ct Head Wo Contrast  11/10/2015  CLINICAL DATA:  Altered mental status EXAM: CT HEAD WITHOUT CONTRAST TECHNIQUE: Contiguous axial images were obtained from the base of the skull through the vertex without intravenous contrast.  COMPARISON:  11/07/2015 FINDINGS: Skull and Sinuses:Central forehead swelling without underlying osseous abnormality. Re- demonstrated central nasal arch fracture without progressed displacement. Visualized orbits: Cataract resection on the left and probable glaucoma reservoir on the right. Brain: No evidence of acute infarction, hemorrhage, hydrocephalus, or mass lesion/mass effect. Generalized atrophy with ventriculomegaly. Chronic small vessel disease with mild ischemic gliosis around the lateral ventricles. IMPRESSION: 1. No acute intracranial finding. 2. Atrophy and chronic small vessel disease. 3. Forehead contusion and known nasal arch fracture. Electronically Signed   By: Monte Fantasia M.D.   On: 11/10/2015 05:07   Dg Chest Port 1 View  11/10/2015  CLINICAL DATA:  Intubation. Followup interstitial edema superimposed upon chronic interstitial lung disease. EXAM: PORTABLE CHEST 1 VIEW COMPARISON:  11/09/2015 and earlier. FINDINGS: Endotracheal tube tip in satisfactory position projecting approximately 5 cm above the carina. Diffuse interstitial pulmonary edema superimposed upon chronic interstitial disease, not significantly changed since yesterday. No new pulmonary parenchymal abnormality. IMPRESSION: 1. Endotracheal tube tip in satisfactory position projecting approximately 5 cm above the carina. 2. Stable diffuse interstitial pulmonary edema superimposed upon chronic interstitial lung disease. 3. No new abnormality. Electronically Signed   By: Evangeline Dakin M.D.   On: 11/10/2015 07:52   Dg Chest Port 1 View  11/09/2015  CLINICAL DATA:  Fever. EXAM: PORTABLE CHEST 1 VIEW COMPARISON:  08/05/2015 FINDINGS: Cardiomediastinal silhouette is normal. Mediastinal contours appear intact. There is no evidence of focal airspace consolidation, pleural effusion or pneumothorax. There is diffuse thickening of the interstitial markings, worse than on the prior radiograph. Osseous structures are without acute  abnormality. Soft tissues are grossly normal. IMPRESSION: Diffuse thickening of the interstitial markings, which may be seen with chronic interstitial lung disease. An element of interstitial pulmonary edema cannot be excluded. Electronically Signed   By: Fidela Salisbury M.D.   On: 11/09/2015 21:10   Dg Abd Portable 1v  11/10/2015  CLINICAL DATA:  Feeding tube placement EXAM: PORTABLE ABDOMEN - 1 VIEW COMPARISON:  None. FINDINGS: NG tube is in place with the tip in the mid to distal stomach, and the side port in the mid stomach. Nonobstructive bowel gas pattern. IMPRESSION: Feeding tube tip in the mid to distal stomach. Electronically Signed   By: Rolm Baptise M.D.   On: 11/10/2015 12:13     STUDIES:  CT head, C-spine 12/20 > Advanced atrophy and microvascular ischemic disease without acute intracranial process. Suspected minimally displaced nasal bone fracture. No additional facial fractures identified on this motion degraded examination. Motion degraded examination without evidence of fracture or static subluxation of the cervical spine. Moderate to severe multilevel cervical spine DDD. CT head 12/23 > No acute intracranial finding. Atrophy and chronic small vessel disease. Forehead  contusion and known nasal arch fracture.  CULTURES: Blood 12/20 > Urine 12/20 > GNR >>> Tracheal aspirate 12/23 >>>  ANTIBIOTICS: Zosyn 12/20 >>>  SIGNIFICANT EVENTS: 12/20 fall, admit 12/22 tx to SDU for increasing lethargy 12/23 worsening MS, tx to ICU for intubation  LINES/TUBES: ETT 12/23 >>>  ASSESSMENT / PLAN:  PULMONARY A: Acute hypoxemic respiratory failure COPD without evidence of acute exacerbation ILD on CT in past R/o aspiration / edema P:   ABg reviewed, may need slight increase in MV Repeat abg in am  pcxr in am BDers  CARDIOVASCULAR A:  H/o HTN, HLD Hypotension this afternoon P:  Telemetry Holding home antihypertensives Bolus Dc precedex Re assess May need  cortisol  RENAL A:   High AG metabolic acidosis- lactic Small NONAG Hypomagnesemia   P:   Trend lactic acid has cleared well, no repeat needed Repeat Mag, replete if indicated Follow BMP May need bicarb for NONAG  GASTROINTESTINAL A:   NPO  P:   NPO Protonix for SUP Start feeds  HEMATOLOGIC A:   Thrombocytopenia, chronic - suspect secondary to alcohol abuse. Mild anemia (Hgb at baseline) H/o prostate Ca s/p TURP  P:  Follow CBC Heparin SQ for VTE ppx Follow plat trend and head hematoma  INFECTIOUS A:   Sepsis secondary to UTI  P:   ABX and cultures as above PCT not needed, will treat 7 days, dc further Follow gram neg noted and narrow off zosyn as able  ENDOCRINE A:   Hypotension R/o rel AI   P:   Follow glucose on BMP Cortisol tsh  NEUROLOGIC A:   Acute metabolic encephalopathy Alcohol withdrawal Parkinsons P:   RASS goal: -1 Precedex infusion - dc , drop BP Fenatynyl IV PRN Repeat alcohol level Serum Osm - 287, no GAP osm present and AG closing NO CIWA in ICU, use MD driven, add verse prn, schedule ativan IV thiamine, folate carbido when able, cant crush NO HALDOL or other antipsychotcs  FAMILY  - Updates: none present  - Inter-disciplinary family meet or Palliative Care meeting due by:  11/17/2015  Ccm time 50 min   Lavon Paganini. Titus Mould, MD, [place] Pgr: [facility]\

## 2015-11-10 NOTE — Progress Notes (Signed)
Initial Nutrition Assessment  DOCUMENTATION CODES:   Not applicable  INTERVENTION:    If unable to extubate within the next 24 hours, recommend initiate TF via OGT with Vital AF 1.2 at 25 ml/h and Prostat 30 ml BID on day 1; on day 2, d/c Prostat and increase to goal rate of 60 ml/h (1440 ml per day) to provide 1728 kcals, 108 gm protein, 1168 ml free water daily.  NUTRITION DIAGNOSIS:   Inadequate oral intake related to inability to eat as evidenced by NPO status.  GOAL:   Patient will meet greater than or equal to 90% of their needs  MONITOR:   Vent status, Labs, Weight trends, I & O's  REASON FOR ASSESSMENT:   Ventilator    ASSESSMENT:   81 year old male with past medical history significant for alcohol abuse, HTN, HLD, gout, COPD, and Parkinson's disease; who presents after falling down the steps of his home and being found down for some unknown time. Patient was intoxicated with altered mentation on presentation.   Patient with decreased LOC throughout the night last night. Transferred to the ICU and required intubation this AM. Labs reviewed: magnesium low.  Patient is currently intubated on ventilator support MV: 9.1 L/min Temp (24hrs), Avg:99.4 F (37.4 C), Min:98.4 F (36.9 C), Max:101 F (38.3 C)   Diet Order:  Diet NPO time specified  Skin:  Reviewed, no issues  Last BM:  PTA  Height:   Ht Readings from Last 1 Encounters:  11/08/15 5\' 9"  (1.753 m)    Weight:   Wt Readings from Last 1 Encounters:  11/10/15 145 lb 15.1 oz (66.2 kg)    Ideal Body Weight:  72.7 kg  BMI:  Body mass index is 21.54 kg/(m^2).  Estimated Nutritional Needs:   Kcal:  [id number]  Protein:  100-115 gm  Fluid:  2 L  EDUCATION NEEDS:   No education needs identified at this time  Molli Barrows, [place], [place], [place] Pager [phone] After Hours Pager [phone]

## 2015-11-10 NOTE — Procedures (Signed)
Intubation Procedure Note Victor Clarke [id number] 08-Apr-1929  Procedure: Intubation Indications: Airway protection and maintenance  Procedure Details Consent: Unable to obtain consent because of emergent medical necessity. Time Out: Verified patient identification, verified procedure, site/side was marked, verified correct patient position, special equipment/implants available, medications/allergies/relevent history reviewed, required imaging and test results available.  Performed  Maximum sterile technique was used including antiseptics, cap, gloves, gown, hand hygiene, mask and sheet.  Mac 3 glidescope    Evaluation Hemodynamic Status: BP stable throughout; O2 sats: stable throughout Patient's Current Condition: stable Complications: No apparent complications Patient did tolerate procedure well. Chest X-ray ordered to verify placement.  CXR: pending.   Georgann Housekeeper, AGACNP-BC [facility]/Critical Care Pager [phone] or [phone]  11/10/2015 6:38 AM

## 2015-11-10 NOTE — Progress Notes (Signed)
Pt has continued to have decreasing LOC throughout the night. Initially felt related to sepsis given elevated lactate, abx broadened out to zosyn to cover for possible urinary source. Additional 1.5 L NS given. Other labs including ABG, ammonia, unrevealing. It was noted on nursing exam that right pupil not as reactive as left, head CT ordered to rule out subacute bleed but this was negative. CCM consulted for further management as I am concerned as to wether patient is protecting airway and do not yet have an identifiable cause to this patient's MS changes. Dr. Hal Hope notified of above situation.

## 2015-11-10 NOTE — Progress Notes (Signed)
Pt was evaluated by PCCM. It was decided to move the pt to ICU. Report was called to [place], RN and pt was moved to 2M08 for further care and monitoring.

## 2015-11-10 NOTE — Progress Notes (Signed)
CRITICAL VALUE ALERT  Critical value received:  Lactic Acid 2.2  Date of notification:  11/10/2015  Time of notification:  0225  Critical value read back:Yes.    Nurse who received alert:  Annice Pih   MD notified (1st page):  TRH  Time of first page:  0230  MD notified (2nd page):  Time of second page:  Responding MD:    Time MD responded:

## 2015-11-10 NOTE — Progress Notes (Signed)
Notified PA that pts is now minimally responding to pain. Pt moves legs during a sternal rub. His right pupil  85mm and non reactive and his left pupil is 67mm and reactive. Pt using accessory neck muscles to breath with a rate of 35 resps per min.  Pt does not spontaneously open his eyes, this is not a new change. PA ordered an ABG and CT of the head and contacted PCCM to consult. Spoke with MD Byrum about pts condition. Will continue to monitor.

## 2015-11-10 NOTE — Progress Notes (Signed)
Spoke with PA concerning the pts condition. Pt is tachypneic and abdominally breathing resp. 20-30 times per min. Pt is lethargic and difficult to arouse. PA suggested calling rapid response to look at the pt.   Rapid response was contacted and said she would come look at the pts. Will continue to monitor.

## 2015-11-11 ENCOUNTER — Inpatient Hospital Stay (HOSPITAL_COMMUNITY): Payer: Medicare Other

## 2015-11-11 DIAGNOSIS — G9340 Encephalopathy, unspecified: Secondary | ICD-10-CM

## 2015-11-11 DIAGNOSIS — I10 Essential (primary) hypertension: Secondary | ICD-10-CM

## 2015-11-11 LAB — GLUCOSE, CAPILLARY: Glucose-Capillary: 130 mg/dL — ABNORMAL HIGH (ref 65–99)

## 2015-11-11 LAB — CBC WITH DIFFERENTIAL/PLATELET
BASOS ABS: 0 10*3/uL (ref 0.0–0.1)
BASOS PCT: 0 %
Eosinophils Absolute: 0 10*3/uL (ref 0.0–0.7)
Eosinophils Relative: 0 %
HEMATOCRIT: 30.4 % — AB (ref 39.0–52.0)
HEMOGLOBIN: 10.5 g/dL — AB (ref 13.0–17.0)
LYMPHS PCT: 6 %
Lymphs Abs: 0.3 10*3/uL — ABNORMAL LOW (ref 0.7–4.0)
MCH: 33.4 pg (ref 26.0–34.0)
MCHC: 34.5 g/dL (ref 30.0–36.0)
MCV: 96.8 fL (ref 78.0–100.0)
MONOS PCT: 4 %
Monocytes Absolute: 0.2 10*3/uL (ref 0.1–1.0)
NEUTROS ABS: 3.9 10*3/uL (ref 1.7–7.7)
NEUTROS PCT: 90 %
Platelets: 109 10*3/uL — ABNORMAL LOW (ref 150–400)
RBC: 3.14 MIL/uL — ABNORMAL LOW (ref 4.22–5.81)
RDW: 18.2 % — ABNORMAL HIGH (ref 11.5–15.5)
WBC: 4.4 10*3/uL (ref 4.0–10.5)

## 2015-11-11 LAB — RENAL FUNCTION PANEL
ANION GAP: 7 (ref 5–15)
Albumin: 2.2 g/dL — ABNORMAL LOW (ref 3.5–5.0)
BUN: 16 mg/dL (ref 6–20)
CALCIUM: 7.7 mg/dL — AB (ref 8.9–10.3)
CO2: 21 mmol/L — AB (ref 22–32)
Chloride: 110 mmol/L (ref 101–111)
Creatinine, Ser: 0.84 mg/dL (ref 0.61–1.24)
GFR calc non Af Amer: >60 mL/min (ref >60)
Glucose, Bld: 184 mg/dL — ABNORMAL HIGH (ref 65–99)
POTASSIUM: 3.2 mmol/L — AB (ref 3.5–5.1)
Phosphorus: 2.8 mg/dL (ref 2.5–4.6)
SODIUM: 138 mmol/L (ref 135–145)

## 2015-11-11 LAB — URINE CULTURE: Culture: >=100000

## 2015-11-11 LAB — MAGNESIUM: Magnesium: 2.2 mg/dL (ref 1.7–2.4)

## 2015-11-11 MED ORDER — POTASSIUM CHLORIDE 10 MEQ/50ML IV SOLN
10.0000 meq | INTRAVENOUS | Status: AC
  Administered 2015-11-11 (×3): 10 meq via INTRAVENOUS
  Filled 2015-11-11 (×3): qty 50

## 2015-11-11 MED ORDER — METOPROLOL TARTRATE 1 MG/ML IV SOLN
5.0000 mg | Freq: Four times a day (QID) | INTRAVENOUS | Status: DC
  Administered 2015-11-12 (×2): 5 mg via INTRAVENOUS
  Filled 2015-11-11 (×6): qty 5

## 2015-11-11 MED ORDER — HALOPERIDOL LACTATE 5 MG/ML IJ SOLN
INTRAMUSCULAR | Status: AC
  Filled 2015-11-11: qty 1

## 2015-11-11 MED ORDER — HALOPERIDOL LACTATE 5 MG/ML IJ SOLN
4.0000 mg | Freq: Four times a day (QID) | INTRAMUSCULAR | Status: DC | PRN
  Administered 2015-11-11 – 2015-11-19 (×7): 4 mg via INTRAVENOUS
  Filled 2015-11-11 (×7): qty 1

## 2015-11-11 MED ORDER — FREE WATER: 50.0000 mL | Status: DC

## 2015-11-11 MED ORDER — FOLIC ACID 5 MG/ML IJ SOLN
1.0000 mg | Freq: Every day | INTRAMUSCULAR | Status: DC
  Administered 2015-11-12: 1 mg via INTRAVENOUS
  Filled 2015-11-11: qty 0.2

## 2015-11-11 MED ORDER — POTASSIUM CHLORIDE 20 MEQ/15ML (10%) PO SOLN
30.0000 meq | ORAL | Status: DC
  Administered 2015-11-11: 30 meq
  Filled 2015-11-11: qty 30

## 2015-11-11 MED ORDER — FENTANYL CITRATE (PF) 100 MCG/2ML IJ SOLN
25.0000 ug | INTRAMUSCULAR | Status: DC | PRN
  Administered 2015-11-11 – 2015-11-12 (×3): 50 ug via INTRAVENOUS
  Filled 2015-11-11 (×9): qty 2

## 2015-11-11 MED ORDER — VITAL AF 1.2 CAL PO LIQD
1000.0000 mL | ORAL | Status: DC
  Filled 2015-11-11 (×2): qty 1000

## 2015-11-11 NOTE — Progress Notes (Signed)
[facility] Progress Note Patient Name: Victor Clarke DOB: 1929/03/10 MRN: [id number]   Date of Service  11/11/2015  HPI/Events of Note  Pt agitated.  eICU Interventions  Will order PRN haldol.     Intervention Category Major Interventions: Other:  Dartanian Knaggs 11/11/2015, 8:44 PM

## 2015-11-11 NOTE — Procedures (Signed)
Extubation Procedure Note  Patient Details:   Name: Victor Clarke DOB: 01/22/1929 MRN: [id number]   Airway Documentation:     Evaluation  O2 sats: stable throughout Complications: No apparent complications Patient did tolerate procedure well. Bilateral Breath Sounds: Diminished, Rhonchi Suctioning: Airway Yes   Pateint extubated to [facility] at this time per MD order. Patient very agitated, but able to vocalize and clear secretions. RT to monitor as needed  Saunders Glance 11/11/2015, 10:40 AM

## 2015-11-11 NOTE — Progress Notes (Signed)
[facility] ADULT ICU REPLACEMENT PROTOCOL FOR AM LAB REPLACEMENT ONLY  The patient does apply for the [facility] Adult ICU Electrolyte Replacment Protocol based on the criteria listed below:   1. Is GFR >/= 40 ml/min? Yes.    Patient's GFR today is >60 2. Is urine output >/= 0.5 ml/kg/hr for the last 6 hours? Yes.   Patient's UOP is 0.9 ml/kg/hr 3. Is BUN < 60 mg/dL? Yes.    Patient's BUN today is 16 4. Abnormal electrolyte(s): K 3.2 5. Ordered repletion with: per protocol 6. If a panic level lab has been reported, has the CCM MD in charge been notified? No..   Physician:    Ronda Fairly A 11/11/2015 6:58 AM

## 2015-11-11 NOTE — Procedures (Signed)
CENTRAL VENOUS CATHETER INSERTION   Indication: Shock Consent: yes Time out: yes Appropriate position: yes Hand washing: yes Patient Sterilized and Draped: yes Location: Right Internal Jugular # of Attempts: 1 Ultrasound Guidance: yes Wire Confirmed with [place]: yes Insertion depth: 20 cm All Ports Draw and flush: yes CXR:   Pneumothorax: no  Line position appropriate: yes Line sutured in place: yes EBL: 5 cc Complications: no Patient Tolerated Procedure Well: yes     Meribeth Mattes, DO., MS [place] [facility]

## 2015-11-11 NOTE — Progress Notes (Signed)
eLink Physician-Brief Progress Note Patient Name: Victor Clarke DOB: [birth year] MRN: [id number]   Date of Service  11/11/2015  HPI/Events of Note  RN notified that portable CXR on my review shows CVL in good position without pneumothorax. Bladder scan with >180cc & minimal uop since 7pm shift began.  eICU Interventions  1. Checking CVP 2. Place Foley-     Intervention Category Intermediate Interventions: Diagnostic test evaluation  Tera Partridge 11/11/2015, 1:01 AM

## 2015-11-11 NOTE — Consult Note (Signed)
PULMONARY / CRITICAL CARE MEDICINE   Name: Victor Clarke MRN: [id number] DOB: Mar 25, 1929    ADMISSION DATE:  11/07/2015 CONSULTATION DATE:  11/07/2015  REFERRING MD:  Dr. Sloan Leiter  CHIEF COMPLAINT:  AMS  HISTORY OF PRESENT ILLNESS:   81 year old male with PMH as below, which includes HTN, HLD, COPD, and Parkinson's. He also has a history of ETOH abuse and has presented to ED several times recently for fall and was found to be intoxicated each time. 12/20 he again presented to [facility] ED s/p falling down steps at home and was found down for unknown time. He was altered and intoxicated in ED. He had lacerations to the front of his scalp and forehead. UA was suspicious for UTI and he was started on ABX. CT head/cspine was obtained and showed no acute intracranial issues. Minimally displaced nasal bone fracture. No C-Spine fracture. He was improved to the hospitalis team and was placed on CIWA protocol. His encephalopathy was much improved the following day 12/21. 12/22 he was becoming more lethargic and was moved to SDU for closer monitoring, however he was still alert and able to follow commands. As the night went on his mental status progressively diminished and his respiratory status decline. PCCM consulted for further evaluation.   SUBJECTIVE: awake and alert   VITAL SIGNS: BP 129/80 mmHg  Pulse 84  Temp(Src) 98.5 F (36.9 C) (Oral)  Resp 20  Ht 5\' 9"  (1.753 m)  Wt 153 lb 3.5 oz (69.5 kg)  BMI 22.62 kg/m2  SpO2 98%  HEMODYNAMICS: CVP:  [4 mmHg-8 mmHg] 8 mmHg  VENTILATOR SETTINGS: Vent Mode:  [-] CPAP;PSV FiO2 (%):  [40 %] 40 % Set Rate:  [15 bmp-18 bmp] 18 bmp Vt Set:  [570 mL] 570 mL PEEP:  [5 cmH20] 5 cmH20 Pressure Support:  [5 cmH20] 5 cmH20 Plateau Pressure:  [14 cmH20-26 cmH20] 14 cmH20  INTAKE / OUTPUT: I/O last 3 completed shifts: In: 4109.6 [I.V.:3384.7; NG/GT:474.8; IV Piggyback:250] Out: [id number] [Urine:1820]  PHYSICAL EXAMINATION: General:  Elderly, chronically ill  appearing male on vent, follows commands Neuro:  awake and interactive HEENT:  Ecchymosis to forehead and bilateral periorbits.  Cardiovascular:  s1 s2, regular, no MRG Lungs:  ronchi Abdomen:  Soft, no r/g, non-distended Musculoskeletal:  No acute deformity Skin:  Grossly intact  LABS:  BMET  Recent Labs Lab 11/09/15 2200 11/10/15 0111 11/11/15 0600  NA 140 141 138  K 3.5 3.7 3.2*  CL 106 108 110  CO2 21* 15* 21*  BUN 9 9 16   CREATININE 0.90 0.83 0.84  GLUCOSE 92 94 184*    Electrolytes  Recent Labs Lab 11/08/15 0109  11/09/15 2200 11/10/15 0111 11/10/15 0354 11/10/15 1845 11/11/15 0600  CALCIUM  --   < > 8.8* 8.3*  --   --  7.7*  MG 1.2*  < >  --  QUANTITY NOT SUFFICIENT, UNABLE TO PERFORM TEST 1.6* 2.4 2.2  PHOS 4.1  --   --   --   --  3.3 2.8  < > = values in this interval not displayed.  CBC  Recent Labs Lab 11/09/15 2200 11/10/15 0111 11/11/15 0600  WBC 5.1 5.6 4.4  HGB 12.9* 12.5* 10.5*  HCT 38.5* 37.4* 30.4*  PLT 107* 99* 109*    Coag's No results for input(s): APTT, INR in the last 168 hours.  Sepsis Markers  Recent Labs Lab 11/10/15 0111 11/10/15 1328 11/10/15 1930  LATICACIDVEN 2.2* 1.0 1.3    ABG  Recent Labs Lab 11/09/15 2200 11/10/15 0338 11/10/15 0746  PHART 7.447 7.247* 7.316*  PCO2ART 27.2* 38.0 35.1  PO2ART 59.7* 59.8* 397.0*    Liver Enzymes  Recent Labs Lab 11/08/15 0916 11/09/15 2200 11/10/15 0111 11/11/15 0600  AST 50* 42* 40  --   ALT 11* 13* 16*  --   ALKPHOS 47 48 47  --   BILITOT 1.0 1.1 QUANTITY NOT SUFFICIENT, UNABLE TO PERFORM TEST  --   ALBUMIN 3.1* 3.5 3.3* 2.2*    Cardiac Enzymes No results for input(s): TROPONINI, PROBNP in the last 168 hours.  Glucose  Recent Labs Lab 11/07/15 1618 11/10/15 0757 11/10/15 1137 11/10/15 1604 11/10/15 1942  GLUCAP 101* 117* 125* 87 105*    Imaging Dg Chest Port 1 View  11/11/2015  CLINICAL DATA:  Central line placement.  Initial encounter.  EXAM: PORTABLE CHEST 1 VIEW COMPARISON:  Chest radiograph performed 11/10/2015 FINDINGS: A right IJ line is noted ending about the mid to distal SVC. The endotracheal tube is seen ending 4-5 cm above the carina. An enteric tube is noted extending below the diaphragm. Patchy bibasilar airspace opacities may reflect mild interstitial edema or pneumonia. A small left pleural effusion is noted. No pneumothorax is seen. The cardiomediastinal silhouette is borderline normal in size. No acute osseous abnormalities are identified. IMPRESSION: 1. Right IJ line noted ending about the mid to distal SVC. 2. Endotracheal tube seen ending 4-5 cm above the carina. 3. Patchy bibasilar airspace opacities may reflect mild interstitial edema or pneumonia. Small left pleural effusion noted. Electronically Signed   By: Garald Balding M.D.   On: 11/11/2015 01:04   Dg Abd Portable 1v  11/10/2015  CLINICAL DATA:  Feeding tube placement EXAM: PORTABLE ABDOMEN - 1 VIEW COMPARISON:  None. FINDINGS: NG tube is in place with the tip in the mid to distal stomach, and the side port in the mid stomach. Nonobstructive bowel gas pattern. IMPRESSION: Feeding tube tip in the mid to distal stomach. Electronically Signed   By: Rolm Baptise M.D.   On: 11/10/2015 12:13     STUDIES:  CT head, C-spine 12/20 > Advanced atrophy and microvascular ischemic disease without acute intracranial process. Suspected minimally displaced nasal bone fracture. No additional facial fractures identified on this motion degraded examination. Motion degraded examination without evidence of fracture or static subluxation of the cervical spine. Moderate to severe multilevel cervical spine DDD. CT head 12/23 > No acute intracranial finding. Atrophy and chronic small vessel disease. Forehead contusion and known nasal arch fracture.  CULTURES: Blood 12/20 > Urine 12/20 > GNR >>> Tracheal aspirate 12/23 >>>  ANTIBIOTICS: Zosyn 12/20 >>>  SIGNIFICANT  EVENTS: 12/20 fall, admit 12/22 tx to SDU for increasing lethargy 12/23 worsening MS, tx to ICU for intubation  LINES/TUBES: ETT 12/23 >>>12/24  ASSESSMENT / PLAN:  PULMONARY A: Acute hypoxemic respiratory failure COPD without evidence of acute exacerbation ILD on CT in past R/o aspiration / edema P:   ABg reviewed, may need slight increase in MV Repeat abg in am  pcxr in am BDers Attempt extubation 12/24  CARDIOVASCULAR A:  H/o HTN, HLD Hypotension this afternoon P:  Telemetry Holding home antihypertensives Bolus Re assess   RENAL A:   High AG metabolic acidosis- lactic Small NONAG Hypomagnesemia repleted  P:   Trend lactic acid has cleared well, no repeat needed Follow BMP CO2 improved    GASTROINTESTINAL A:   NPO  P:   Swallow evaluation. Protonix for SUP  HEMATOLOGIC  A:   Thrombocytopenia, chronic - suspect secondary to alcohol abuse. Mild anemia (Hgb at baseline) H/o prostate Ca s/p TURP  P:  Follow CBC Heparin SQ for VTE ppx Follow plat trend and head hematoma  INFECTIOUS A:   Sepsis secondary to UTI  P:   ABX and cultures as above PCT not needed, will treat 7 days, dc further Follow gram neg noted and narrow off zosyn as able  ENDOCRINE CBG (last 3)   Recent Labs  11/10/15 1137 11/10/15 1604 11/10/15 1942  GLUCAP 125* 87 105*     A:   Hypotension R/o rel AI   P:   Follow glucose on BMP Cortisol tsh  NEUROLOGIC A:   Acute metabolic encephalopathy Alcohol withdrawal Parkinsons P:   Fenatynyl IV PRN IV thiamine, folate carbido when able, cant crush Haldol if becomes combative. D/C all sedation except for PRN fentanyl. CIWA protocol now that patient is extubated.  FAMILY  - Updates: none present  - Inter-disciplinary family meet or Palliative Care meeting due by:  11/17/2015  Richardson Landry Minor ACNP Maryanna Shape PCCM Pager [phone] till 3 pm If no answer page [phone] 11/11/2015, 10:06 AM  Attending  Note:  81 year old male with PMH of COPD and ?ILD presenting with a  COPD exacerbation and respiratory failure.  He diuresed well and is able to tolerated 5/5, will extubate today.  On exam mild end exp wheezes noted.  Titrate o2 as able, ambulate, swallow evaluation, hold in ICU for observation overnight.  The patient is critically ill with multiple organ systems failure and requires high complexity decision making for assessment and support, frequent evaluation and titration of therapies, application of advanced monitoring technologies and extensive interpretation of multiple databases.   Critical Care Time devoted to patient care services described in this note is  35  Minutes. This time reflects time of care of this signee Dr [name]. This critical care time does not reflect procedure time, or teaching time or supervisory time of PA/NP/Med student/Med Resident etc but could involve care discussion time.  Rush Farmer, M.D. [facility] Pulmonary/Critical Care Medicine. Pager: [phone]. After hours pager: [phone].

## 2015-11-11 NOTE — Progress Notes (Addendum)
Nutrition Follow-up  DOCUMENTATION CODES:   Not applicable  INTERVENTION:  - Will order Vital AF 1.2 @ 60 mL/hr which will provide 1728 kcal, 108 grams of protein, and 1168 mL free water. (Initiate TF via OGT with Vital AF 1.2 at 25 ml/h and Prostat 30 ml BID on day 1; on day 2, increase to goal rate of 60 ml/h (1440 ml per day)). - Will order 50 mL free water every 4 hours to provide additional 300 mL free water - Monitor magnesium, potassium, and phosphorus daily for at least 3 days, MD to replete as needed, as pt is at risk for refeeding syndrome given hx of alcoholism, possible poor PO intakes. - RD will continue to monitor for needs  NUTRITION DIAGNOSIS:   Inadequate oral intake related to inability to eat as evidenced by NPO status -ongoing  GOAL:   Patient will meet greater than or equal to 90% of their needs -unmet with TF not yet started  MONITOR:   Vent status, TF tolerance, Weight trends, Labs, Skin, I & O's  REASON FOR ASSESSMENT:   Consult Enteral/tube feeding initiation and management  ASSESSMENT:   81 year old male with past medical history significant for alcohol abuse, HTN, HLD, gout, COPD, and Parkinson's disease; who presents after falling down the steps of his home and being found down for some unknown time. Patient was intoxicated with altered mentation on presentation.   12/24 New consult for RD to initiate and manage TF. Pt remains intubated with OGT in place. Vital High Protein hung but not yet started. Pt remains agitated with bilateral mitten restraints; still unable to complete physical assessment at this time.   Patient is currently intubated on ventilator support MV: 11.2 L/min Temp (24hrs), Avg:98.7 F (37.1 C), Min:97.9 F (36.6 C), Max:99.8 F (37.7 C) Propofol: none ml/hr  Nutrition needs adjusted based on current vent settings and Tmax. Will order TF and free water flush as outlined above; will follow PepUp Protocol. Not meeting needs.  Medications reviewed; 30 mEq every 4 hours. Labs reviewed; CBGs: 87-125 mg/dL, K: 3.2 mmol/L, Ca: 7.7 mg/dL.    12/23 - Patient with decreased LOC throughout the night last night.  - Transferred to the ICU and required intubation this AM. - Patient is currently intubated on ventilator support - MV: 9.1 L/min  Diet Order:  Diet NPO time specified  Skin:  Wound (see comment) (Head laceration)  Last BM:  PTA  Height:   Ht Readings from Last 1 Encounters:  11/08/15 5\' 9"  (1.753 m)    Weight:   Wt Readings from Last 1 Encounters:  11/11/15 153 lb 3.5 oz (69.5 kg)    Ideal Body Weight:  72.7 kg  BMI:  Body mass index is 22.62 kg/(m^2).  Estimated Nutritional Needs:   Kcal:  [id number]  Protein:  83-104 grams  Fluid:  2 L/day  EDUCATION NEEDS:   No education needs identified at this time     Jarome Matin, RD, LDN Inpatient Clinical Dietitian Pager # [phone] After hours/weekend pager # [phone]

## 2015-11-12 ENCOUNTER — Inpatient Hospital Stay (HOSPITAL_COMMUNITY): Payer: Medicare Other

## 2015-11-12 LAB — GLUCOSE, CAPILLARY
GLUCOSE-CAPILLARY: 117 mg/dL — AB (ref 65–99)
Glucose-Capillary: 109 mg/dL — ABNORMAL HIGH (ref 65–99)
Glucose-Capillary: 104 mg/dL — ABNORMAL HIGH (ref 65–99)

## 2015-11-12 LAB — BASIC METABOLIC PANEL
Anion gap: 8 (ref 5–15)
BUN: 15 mg/dL (ref 6–20)
CALCIUM: 7.9 mg/dL — AB (ref 8.9–10.3)
CO2: 19 mmol/L — AB (ref 22–32)
Chloride: 116 mmol/L — ABNORMAL HIGH (ref 101–111)
Creatinine, Ser: 0.87 mg/dL (ref 0.61–1.24)
GFR calc Af Amer: >60 mL/min (ref >60)
GLUCOSE: 130 mg/dL — AB (ref 65–99)
POTASSIUM: 3.2 mmol/L — AB (ref 3.5–5.1)
Sodium: 143 mmol/L (ref 135–145)

## 2015-11-12 LAB — CBC
HEMATOCRIT: 27.5 % — AB (ref 39.0–52.0)
HEMOGLOBIN: 9.4 g/dL — AB (ref 13.0–17.0)
MCH: 32.9 pg (ref 26.0–34.0)
MCHC: 34.2 g/dL (ref 30.0–36.0)
MCV: 96.2 fL (ref 78.0–100.0)
Platelets: 128 10*3/uL — ABNORMAL LOW (ref 150–400)
RBC: 2.86 MIL/uL — ABNORMAL LOW (ref 4.22–5.81)
RDW: 18.3 % — AB (ref 11.5–15.5)
WBC: 5.7 10*3/uL (ref 4.0–10.5)

## 2015-11-12 LAB — MAGNESIUM: MAGNESIUM: 2 mg/dL (ref 1.7–2.4)

## 2015-11-12 LAB — PHOSPHORUS: Phosphorus: 2.3 mg/dL — ABNORMAL LOW (ref 2.5–4.6)

## 2015-11-12 MED ORDER — HYDRALAZINE HCL 20 MG/ML IJ SOLN
10.0000 mg | INTRAMUSCULAR | Status: DC | PRN
  Filled 2015-11-12: qty 1

## 2015-11-12 MED ORDER — FOLIC ACID 1 MG PO TABS
1.0000 mg | ORAL_TABLET | Freq: Every day | ORAL | Status: DC
  Administered 2015-11-13 – 2015-11-20 (×8): 1 mg via ORAL
  Filled 2015-11-12 (×10): qty 1

## 2015-11-12 MED ORDER — FUROSEMIDE 10 MG/ML IJ SOLN
INTRAMUSCULAR | Status: AC
  Filled 2015-11-12: qty 4

## 2015-11-12 MED ORDER — DILTIAZEM HCL ER COATED BEADS 120 MG PO CP24
120.0000 mg | ORAL_CAPSULE | Freq: Every day | ORAL | Status: DC
  Administered 2015-11-13 – 2015-11-14 (×2): 120 mg via ORAL
  Filled 2015-11-12 (×4): qty 1

## 2015-11-12 MED ORDER — VITAMIN B-1 100 MG PO TABS
100.0000 mg | ORAL_TABLET | Freq: Every day | ORAL | Status: DC
  Administered 2015-11-13 – 2015-11-20 (×8): 100 mg via ORAL
  Filled 2015-11-12 (×8): qty 1

## 2015-11-12 MED ORDER — MORPHINE SULFATE (PF) 2 MG/ML IV SOLN
2.0000 mg | Freq: Once | INTRAVENOUS | Status: AC
  Administered 2015-11-12: 2 mg via INTRAVENOUS
  Filled 2015-11-12: qty 1

## 2015-11-12 MED ORDER — POTASSIUM CHLORIDE CRYS ER 20 MEQ PO TBCR
40.0000 meq | EXTENDED_RELEASE_TABLET | Freq: Three times a day (TID) | ORAL | Status: DC
  Filled 2015-11-12: qty 2

## 2015-11-12 MED ORDER — FUROSEMIDE 10 MG/ML IJ SOLN
40.0000 mg | Freq: Once | INTRAMUSCULAR | Status: AC
  Administered 2015-11-12: 40 mg via INTRAVENOUS

## 2015-11-12 MED ORDER — POTASSIUM CHLORIDE 10 MEQ/100ML IV SOLN
10.0000 meq | INTRAVENOUS | Status: AC
  Administered 2015-11-12 – 2015-11-13 (×4): 10 meq via INTRAVENOUS
  Filled 2015-11-12 (×4): qty 100

## 2015-11-12 NOTE — Progress Notes (Signed)
eLink Physician-Brief Progress Note Patient Name: Victor Clarke DOB: Oct 30, 1929 MRN: [id number]   Date of Service  11/12/2015  HPI/Events of Note  Called for respiratory distress, but now calm Hypoxemic, dyspneic Noted to have positive I/O balance for a few days  eICU Interventions  Check CXR KVO fluids Lasix now Close monitoring     Intervention Category Major Interventions: Respiratory failure - evaluation and management  Simonne Maffucci 11/12/2015, 5:26 PM

## 2015-11-12 NOTE — Progress Notes (Signed)
eLink Physician-Brief Progress Note Patient Name: Victor Clarke DOB: 12-25-1928 MRN: [id number]   Date of Service  11/12/2015  HPI/Events of Note  CXR shows pulmonary edema Still some agitation SBP elevated Respiratory status stable Making lots of urine after lasix Fluids are on hold  eICU Interventions  Add hydralazine Morphine 2mg  IV x1 dose for relief of dyspnea Close monitoring in ICU     Intervention Category Major Interventions: Respiratory failure - evaluation and management  Simonne Maffucci 11/12/2015, 6:58 PM

## 2015-11-12 NOTE — Evaluation (Signed)
Clinical/Bedside Swallow Evaluation Patient Details  Name: Victor Clarke MRN: [id number] Date of Birth: 1929/05/28  Today's Date: 11/12/2015 Time: SLP Start Time (ACUTE ONLY): 56 SLP Stop Time (ACUTE ONLY): 1252 SLP Time Calculation (min) (ACUTE ONLY): 22 min  Past Medical History:  Past Medical History  Diagnosis Date  . Hypertension   . Hypercholesteremia   . Gout   . COPD (chronic obstructive pulmonary disease) ([place])   . Detached retina     a. s/p surgical correction on the right.  . Prostate cancer ([place])     a. s/p TURP.  Marland Kitchen Ectopic atrial tachycardia ([place])     a. 08/2014 Echo: EF 55-60%, mildly dil LA.  . Parkinson's disease ([place])   . Left inguinal hernia     a. s/p repair ~ 10 yrs ago.   Past Surgical History:  Past Surgical History  Procedure Laterality Date  . Colonoscopy w/ polypectomy  2001, 2006  . Hernia repair    . Prostate surgery     HPI:  81 year old male with PMH of HTN, COPD, Parkinson's, ETOH admitted 12/20 with fall down steps, AMS, intoxication, scalp and forehead lacerations. UA was suspicious for UTI. Declined 12/23 requiring intubation. Extubated 12/24. Agitation noted.    Assessment / Plan / Recommendation Clinical Impression  Patient presents with an acute reversible oropharyngeal dysphagia with both cognitive and respiratory origins. Combination of significantly AMS with poor awareness of bolus and SOB impacting coordination of respirations and swallow increase aspiration risk at this time. Prognosis for ability to resume a diet good with improved mentation overall. Recommend NPO except meds crushed in puree with close SLP f/u for diagnostic treatment.     Aspiration Risk  Severe aspiration risk    Diet Recommendation NPO except meds   Medication Administration: Crushed with puree    Other  Recommendations Oral Care Recommendations: Oral care QID   Follow up Recommendations   (TBD)    Frequency and Duration min 2x/week  2 weeks        Prognosis Prognosis for Safe Diet Advancement: Good Barriers to Reach Goals: Cognitive deficits      Swallow Study   General HPI: 81 year old male with PMH of HTN, COPD, Parkinson's, ETOH admitted 12/20 with fall down steps, AMS, intoxication, scalp and forehead lacerations. UA was suspicious for UTI. Declined 12/23 requiring intubation. Extubated 12/24. Agitation noted.  Type of Study: Bedside Swallow Evaluation Previous Swallow Assessment: none noted Diet Prior to this Study: NPO Temperature Spikes Noted: No Respiratory Status: Nasal cannula History of Recent Intubation: Yes Length of Intubations (days): 1 days Date extubated: 11/11/15 Behavior/Cognition: Alert;Confused;Cooperative Oral Cavity Assessment: Within Functional Limits Oral Care Completed by SLP: Recent completion by staff Oral Cavity - Dentition: Adequate natural dentition Vision: Functional for self-feeding Self-Feeding Abilities: Able to feed self;Needs assist (due to confusion) Patient Positioning: Upright in bed Baseline Vocal Quality: Hoarse Volitional Cough: Congested Volitional Swallow: Able to elicit    Oral/Motor/Sensory Function Overall Oral Motor/Sensory Function: Mild impairment Facial ROM: Reduced left Facial Symmetry: Within Functional Limits Facial Strength: Within Functional Limits Facial Sensation: Within Functional Limits Lingual ROM: Within Functional Limits Lingual Symmetry: Within Functional Limits Lingual Strength: Within Functional Limits Lingual Sensation: Within Functional Limits Velum: Within Functional Limits Mandible: Within Functional Limits   Ice Chips Ice chips: Not tested   Thin Liquid Thin Liquid: Impaired Presentation: Cup;Straw Oral Phase Impairments: Reduced labial seal;Poor awareness of bolus Oral Phase Functional Implications: Right anterior spillage;Left anterior spillage Pharyngeal  Phase Impairments: Suspected delayed Swallow;Multiple swallows;Cough - Immediate  (frequent belching)    Nectar Thick Nectar Thick Liquid: Not tested   Honey Thick Honey Thick Liquid: Not tested   Puree Puree: Impaired Presentation: Spoon Oral Phase Impairments: Poor awareness of bolus Oral Phase Functional Implications: Prolonged oral transit   Solid Solid: Not tested      Gabriel Rainwater MA, CCC-SLP [phone]  Havanna Groner Meryl 11/12/2015,1:00 PM

## 2015-11-12 NOTE — Progress Notes (Signed)
eLink Physician-Brief Progress Note Patient Name: Victor Clarke DOB: [birth year] MRN: [id number]   Date of Service  11/12/2015  HPI/Events of Note  Markedly agitated, required multiple nurses to hold him down, receiving benzo and haldol No safety sitter available  eICU Interventions  Posey belt, close monitoring     Intervention Category Major Interventions: Delirium, psychosis, severe agitation - evaluation and management  Simonne Maffucci 11/12/2015, 3:37 PM

## 2015-11-12 NOTE — Progress Notes (Signed)
eLink Physician-Brief Progress Note Patient Name: Victor Clarke DOB: 1929/11/02 MRN: [id number]   Date of Service  11/12/2015  HPI/Events of Note  Low K, not taking PO  eICU Interventions  Replace via peripheral IV     Intervention Category Minor Interventions: Electrolytes abnormality - evaluation and management  Simonne Maffucci 11/12/2015, 9:41 PM

## 2015-11-12 NOTE — Progress Notes (Signed)
PULMONARY / CRITICAL CARE MEDICINE   Name: Victor Clarke MRN: [id number] DOB: 11/26/1928    ADMISSION DATE:  11/07/2015 CONSULTATION DATE:  11/07/2015  REFERRING MD:  Dr. Sloan Leiter  CHIEF COMPLAINT:  AMS  HISTORY OF PRESENT ILLNESS:   81 year old male with PMH as below, which includes HTN, HLD, COPD, and Parkinson's. He also has a history of ETOH abuse and has presented to ED several times recently for fall and was found to be intoxicated each time. 12/20 he again presented to [facility] ED s/p falling down steps at home and was found down for unknown time. He was altered and intoxicated in ED. He had lacerations to the front of his scalp and forehead. UA was suspicious for UTI and he was started on ABX. CT head/cspine was obtained and showed no acute intracranial issues. Minimally displaced nasal bone fracture. No C-Spine fracture. He was improved to the hospitalis team and was placed on CIWA protocol. His encephalopathy was much improved the following day 12/21. 12/22 he was becoming more lethargic and was moved to SDU for closer monitoring, however he was still alert and able to follow commands. As the night went on his mental status progressively diminished and his respiratory status decline. PCCM consulted for further evaluation.   SUBJECTIVE: awake and alert   VITAL SIGNS: BP 167/85 mmHg  Pulse 76  Temp(Src) 98.3 F (36.8 C) (Oral)  Resp 20  Ht 5\' 9"  (1.753 m)  Wt 153 lb 3.5 oz (69.5 kg)  BMI 22.62 kg/m2  SpO2 81%  HEMODYNAMICS:    VENTILATOR SETTINGS: Vent Mode:  [-]  FiO2 (%):  [40 %] 40 %  INTAKE / OUTPUT: I/O last 3 completed shifts: In: 3809.2 [I.V.:2771.4; NG/GT:587.8; IV Piggyback:450] Out: 1045 [Urine:1045]  PHYSICAL EXAMINATION: General:  Elderly, chronically ill appearing male on vent, follows commands Neuro:  awake and interactive HEENT:  Ecchymosis to forehead and bilateral periorbits.  Cardiovascular:  s1 s2, regular, no MRG Lungs:  ronchi Abdomen:  Soft, no  r/g, non-distended Musculoskeletal:  No acute deformity Skin:  Grossly intact  LABS:  BMET  Recent Labs Lab 11/10/15 0111 11/11/15 0600 11/12/15 0430  NA 141 138 143  K 3.7 3.2* 3.2*  CL 108 110 116*  CO2 15* 21* 19*  BUN 9 16 15   CREATININE 0.83 0.84 0.87  GLUCOSE 94 184* 130*    Electrolytes  Recent Labs Lab 11/10/15 0111  11/10/15 1845 11/11/15 0600 11/12/15 0430  CALCIUM 8.3*  --   --  7.7* 7.9*  MG QUANTITY NOT SUFFICIENT, UNABLE TO PERFORM TEST  < > 2.4 2.2 2.0  PHOS  --   --  3.3 2.8 2.3*  < > = values in this interval not displayed.  CBC  Recent Labs Lab 11/10/15 0111 11/11/15 0600 11/12/15 0430  WBC 5.6 4.4 5.7  HGB 12.5* 10.5* 9.4*  HCT 37.4* 30.4* 27.5*  PLT 99* 109* 128*    Coag's No results for input(s): APTT, INR in the last 168 hours.  Sepsis Markers  Recent Labs Lab 11/10/15 0111 11/10/15 1328 11/10/15 1930  LATICACIDVEN 2.2* 1.0 1.3    ABG  Recent Labs Lab 11/09/15 2200 11/10/15 0338 11/10/15 0746  PHART 7.447 7.247* 7.316*  PCO2ART 27.2* 38.0 35.1  PO2ART 59.7* 59.8* 397.0*    Liver Enzymes  Recent Labs Lab 11/08/15 0916 11/09/15 2200 11/10/15 0111 11/11/15 0600  AST 50* 42* 40  --   ALT 11* 13* 16*  --   ALKPHOS 47 48  47  --   BILITOT 1.0 1.1 QUANTITY NOT SUFFICIENT, UNABLE TO PERFORM TEST  --   ALBUMIN 3.1* 3.5 3.3* 2.2*    Cardiac Enzymes No results for input(s): TROPONINI, PROBNP in the last 168 hours.  Glucose  Recent Labs Lab 11/10/15 1137 11/10/15 1604 11/10/15 1942 11/11/15 2016 11/12/15 0350 11/12/15 0819  GLUCAP 125* 87 105* 130* 109* 117*    Imaging Dg Chest Port 1 View  11/12/2015  CLINICAL DATA:  Respiratory failure EXAM: PORTABLE CHEST 1 VIEW COMPARISON:  11/11/2015 FINDINGS: Interval removal of ET tube. The right IJ catheter tip is in the cavoatrial junction. Aortic atherosclerosis. Normal heart size. There is aortic atherosclerosis noted. Stable appearance of bilateral lung  opacities. IMPRESSION: 1. No change in bilateral lung opacities. 2. Status post extubation. Electronically Signed   By: Kerby Moors M.D.   On: 11/12/2015 08:36     STUDIES:  CT head, C-spine 12/20 > Advanced atrophy and microvascular ischemic disease without acute intracranial process. Suspected minimally displaced nasal bone fracture. No additional facial fractures identified on this motion degraded examination. Motion degraded examination without evidence of fracture or static subluxation of the cervical spine. Moderate to severe multilevel cervical spine DDD. CT head 12/23 > No acute intracranial finding. Atrophy and chronic small vessel disease. Forehead contusion and known nasal arch fracture.  CULTURES: Blood 12/20 > Urine 12/20 > GNR >>> Tracheal aspirate 12/23 >>>  ANTIBIOTICS: Zosyn 12/20 >>>  SIGNIFICANT EVENTS: 12/20 fall, admit 12/22 tx to SDU for increasing lethargy 12/23 worsening MS, tx to ICU for intubation  LINES/TUBES: ETT 12/23 >>>12/24  ASSESSMENT / PLAN:  PULMONARY A: Acute hypoxemic respiratory failure COPD without evidence of acute exacerbation ILD on CT in past R/o aspiration / edema P:   Extubated 12/24 pcxr i BDers   CARDIOVASCULAR A:  H/o HTN, HLD  P:  Telemetry Holding home antihypertensives, may need to resume soon   RENAL A:   High AG metabolic acidosis- lactic(resolved)Small NONAG Hypomagnesemia repleted  P:   Trend lactic acid has cleared well, no repeat needed Follow BMP CO2 improved    GASTROINTESTINAL A:   NPO  P:   Swallow evaluation. When not agitated Protonix for SUP  HEMATOLOGIC A:   Thrombocytopenia, chronic - suspect secondary to alcohol abuse. Mild anemia (Hgb at baseline) H/o prostate Ca s/p TURP  P:  Follow CBC Heparin SQ for VTE ppx Follow plat trend and head hematoma  INFECTIOUS A:   Sepsis secondary to UTI  P:   ABX and cultures as above PCT not needed, will treat 7 days, dc  further Follow gram neg noted and narrow off zosyn as able  ENDOCRINE CBG (last 3)   Recent Labs  11/11/15 2016 11/12/15 0350 11/12/15 0819  GLUCAP 130* 109* 117*     A:   Hypotension(resolved) R/o rel AI   P:   Follow glucose on BMP Cortisol low but low bp resolved   NEUROLOGIC A:   Acute metabolic encephalopathy Alcohol withdrawal Parkinsons P:   Fenatynyl IV PRN IV thiamine, folate carbido when able, cant crush Haldol if becomes combative. D/C all sedation except for PRN fentanyl. CIWA protocol now that patient is extubated. Continue sitters  Summary: Agitation is main issue. He may do OK on SDU.  FAMILY  - Updates: none present  - Inter-disciplinary family meet or Palliative Care meeting due by:  11/17/2015  Richardson Landry Minor ACNP Maryanna Shape PCCM Pager [phone] till 3 pm If no answer page [phone] 11/12/2015, 9:29  AM CCT 25 min  Attending Note:  81 year old male with PMH of COPD and ?ILD presenting with a COPD exacerbation and respiratory failure. Continue to diurese at this point.  Titrate O2 down as able.  On exam, bibasilar crackles.  I reviewed CXR myself, pulmonary edema improving.  Discussed with PCCM-NP and TRH-MD.  Hypoxemic respiratory failure:  - Titrate O2.  - Mobilize.  - F/U CXR.  Pulmonary edema:  - Diureses as ordered.  - KVO IVF.  Pneumonia:  - Zosyn.  - Follow up on cultures.  AMS: due to etoh without evidence of DT at this time.  - CIWA.  - Thiamine.  - Folate.  - SDU.  Titrate O2 for sat of 88-92%.  Transfer to SDU and to [facility] service with PCCM off 12/26.  Patient seen and examined, agree with above note.  I dictated the care and orders written for this patient under my direction.  Rush Farmer, M.D. [facility] Pulmonary/Critical Care Medicine. Pager: [phone]. After hours pager: [phone].

## 2015-11-13 ENCOUNTER — Inpatient Hospital Stay (HOSPITAL_COMMUNITY): Payer: Medicare Other

## 2015-11-13 DIAGNOSIS — N390 Urinary tract infection, site not specified: Secondary | ICD-10-CM

## 2015-11-13 LAB — POCT I-STAT 3, ART BLOOD GAS (G3+)
Acid-base deficit: 2 mmol/L (ref 0.0–2.0)
BICARBONATE: 23.4 meq/L (ref 20.0–24.0)
Bicarbonate: 20.5 mEq/L (ref 20.0–24.0)
O2 Saturation: 91 %
O2 Saturation: 100 %
PCO2 ART: 26.4 mmHg — AB (ref 35.0–45.0)
PH ART: 7.498 — AB (ref 7.350–7.450)
PH ART: 7.467 — AB (ref 7.350–7.450)
Patient temperature: 98.8
TCO2: 21 mmol/L (ref 0–100)
TCO2: 24 mmol/L (ref 0–100)
pCO2 arterial: 32.4 mmHg — ABNORMAL LOW (ref 35.0–45.0)
pO2, Arterial: 55 mmHg — ABNORMAL LOW (ref 80.0–100.0)
pO2, Arterial: 412 mmHg — ABNORMAL HIGH (ref 80.0–100.0)

## 2015-11-13 LAB — GLUCOSE, CAPILLARY
GLUCOSE-CAPILLARY: 108 mg/dL — AB (ref 65–99)
GLUCOSE-CAPILLARY: 121 mg/dL — AB (ref 65–99)
GLUCOSE-CAPILLARY: 137 mg/dL — AB (ref 65–99)
Glucose-Capillary: 122 mg/dL — ABNORMAL HIGH (ref 65–99)
Glucose-Capillary: 123 mg/dL — ABNORMAL HIGH (ref 65–99)

## 2015-11-13 LAB — MAGNESIUM
MAGNESIUM: 1.3 mg/dL — AB (ref 1.7–2.4)
MAGNESIUM: 2.4 mg/dL (ref 1.7–2.4)

## 2015-11-13 LAB — PHOSPHORUS: PHOSPHORUS: 2.4 mg/dL — AB (ref 2.5–4.6)

## 2015-11-13 LAB — BASIC METABOLIC PANEL
ANION GAP: 10 (ref 5–15)
BUN: 9 mg/dL (ref 6–20)
CALCIUM: 8.3 mg/dL — AB (ref 8.9–10.3)
CO2: 23 mmol/L (ref 22–32)
Chloride: 111 mmol/L (ref 101–111)
Creatinine, Ser: 1.07 mg/dL (ref 0.61–1.24)
GFR calc Af Amer: >60 mL/min (ref >60)
GLUCOSE: 123 mg/dL — AB (ref 65–99)
POTASSIUM: 3.2 mmol/L — AB (ref 3.5–5.1)
SODIUM: 144 mmol/L (ref 135–145)

## 2015-11-13 LAB — CBC
HEMATOCRIT: 30.5 % — AB (ref 39.0–52.0)
HEMOGLOBIN: 10.1 g/dL — AB (ref 13.0–17.0)
MCH: 31.9 pg (ref 26.0–34.0)
MCHC: 33.1 g/dL (ref 30.0–36.0)
MCV: 96.2 fL (ref 78.0–100.0)
Platelets: 189 10*3/uL (ref 150–400)
RBC: 3.17 MIL/uL — ABNORMAL LOW (ref 4.22–5.81)
RDW: 18.3 % — AB (ref 11.5–15.5)
WBC: 5.8 10*3/uL (ref 4.0–10.5)

## 2015-11-13 MED ORDER — SODIUM PHOSPHATE 3 MMOLE/ML IV SOLN
30.0000 mmol | Freq: Once | INTRAVENOUS | Status: AC
  Administered 2015-11-13: 30 mmol via INTRAVENOUS
  Filled 2015-11-13: qty 10

## 2015-11-13 MED ORDER — FENTANYL CITRATE (PF) 100 MCG/2ML IJ SOLN
INTRAMUSCULAR | Status: AC
  Filled 2015-11-13: qty 4

## 2015-11-13 MED ORDER — FENTANYL CITRATE (PF) 100 MCG/2ML IJ SOLN
25.0000 ug | INTRAMUSCULAR | Status: DC | PRN
  Administered 2015-11-13 – 2015-11-14 (×4): 50 ug via INTRAVENOUS

## 2015-11-13 MED ORDER — FENTANYL CITRATE (PF) 100 MCG/2ML IJ SOLN
100.0000 ug | Freq: Once | INTRAMUSCULAR | Status: AC
  Administered 2015-11-13: 100 ug via INTRAVENOUS

## 2015-11-13 MED ORDER — MIDAZOLAM HCL 2 MG/2ML IJ SOLN
2.0000 mg | Freq: Once | INTRAMUSCULAR | Status: AC
  Administered 2015-11-13: 2 mg via INTRAVENOUS

## 2015-11-13 MED ORDER — POTASSIUM CHLORIDE 10 MEQ/50ML IV SOLN
10.0000 meq | INTRAVENOUS | Status: AC
  Administered 2015-11-13 (×4): 10 meq via INTRAVENOUS
  Filled 2015-11-13 (×4): qty 50

## 2015-11-13 MED ORDER — ROCURONIUM BROMIDE 50 MG/5ML IV SOLN
50.0000 mg | Freq: Once | INTRAVENOUS | Status: AC
Start: 2015-11-13 — End: 2015-11-13
  Administered 2015-11-13: 50 mg via INTRAVENOUS

## 2015-11-13 MED ORDER — SODIUM CHLORIDE 0.9 % IV SOLN
6.0000 g | Freq: Once | INTRAVENOUS | Status: AC
  Administered 2015-11-13: 6 g via INTRAVENOUS
  Filled 2015-11-13: qty 12

## 2015-11-13 MED ORDER — PANTOPRAZOLE SODIUM 40 MG IV SOLR
40.0000 mg | INTRAVENOUS | Status: DC
  Administered 2015-11-13 – 2015-11-17 (×5): 40 mg via INTRAVENOUS
  Filled 2015-11-13 (×6): qty 40

## 2015-11-13 MED ORDER — MIDAZOLAM HCL 2 MG/2ML IJ SOLN
INTRAMUSCULAR | Status: AC
  Filled 2015-11-13: qty 4

## 2015-11-13 MED ORDER — MIDAZOLAM HCL 2 MG/2ML IJ SOLN
1.0000 mg | INTRAMUSCULAR | Status: DC | PRN
  Administered 2015-11-13 – 2015-11-16 (×8): 2 mg via INTRAVENOUS
  Filled 2015-11-13 (×10): qty 2

## 2015-11-13 MED ORDER — ETOMIDATE 2 MG/ML IV SOLN
20.0000 mg | Freq: Once | INTRAVENOUS | Status: AC
  Administered 2015-11-13: 20 mg via INTRAVENOUS

## 2015-11-13 NOTE — Progress Notes (Signed)
RT went to patient room and patient is less responsive.  ABG was obtained and results given to MD.  Will wait for further instructions.  Will continue to monitor.    Ref. Range 11/13/2015 10:07  Sample type Unknown ARTERIAL  pH, Arterial Latest Ref Range: 7.350-7.450  7.498 (H)  pCO2 arterial Latest Ref Range: 35.0-45.0 mmHg 26.4 (L)  pO2, Arterial Latest Ref Range: 80.0-100.0 mmHg 55.0 (L)  Bicarbonate Latest Ref Range: 20.0-24.0 mEq/L 20.5  TCO2 Latest Ref Range: 0-100 mmol/L 21  Acid-base deficit Latest Ref Range: 0.0-2.0 mmol/L 2.0  O2 Saturation Latest Units: % 91.0  Patient temperature Unknown 98.8 F  Collection site Unknown RADIAL, ALLEN'S T.Marland KitchenMarland Kitchen

## 2015-11-13 NOTE — Progress Notes (Signed)
PULMONARY / CRITICAL CARE MEDICINE   Name: Victor Clarke MRN: [id number] DOB: 1929/10/10    ADMISSION DATE:  11/07/2015 CONSULTATION DATE:  11/07/2015  REFERRING MD:  Dr. Sloan Leiter  CHIEF COMPLAINT:  AMS  HISTORY OF PRESENT ILLNESS:   81 year old male with PMH as below, which includes HTN, HLD, COPD, and Parkinson's. He also has a history of ETOH abuse and has presented to ED several times recently for fall and was found to be intoxicated each time. 12/20 he again presented to [facility] ED s/p falling down steps at home and was found down for unknown time. He was altered and intoxicated in ED. He had lacerations to the front of his scalp and forehead. UA was suspicious for UTI and he was started on ABX. CT head/cspine was obtained and showed no acute intracranial issues. Minimally displaced nasal bone fracture. No C-Spine fracture. He was improved to the hospitalis team and was placed on CIWA protocol. His encephalopathy was much improved the following day 12/21. 12/22 he was becoming more lethargic and was moved to SDU for closer monitoring, however he was still alert and able to follow commands. As the night went on his mental status progressively diminished and his respiratory status decline. PCCM consulted for further evaluation.   SUBJECTIVE: Obtunded this AM.   VITAL SIGNS: BP 80/59 mmHg  Pulse 86  Temp(Src) 98.8 F (37.1 C) (Oral)  Resp 18  Ht 5\' 9"  (1.753 m)  Wt 65.8 kg (145 lb 1 oz)  BMI 21.41 kg/m2  SpO2 100%  HEMODYNAMICS:    VENTILATOR SETTINGS: Vent Mode:  [-] PRVC FiO2 (%):  [40 %-100 %] 100 % Set Rate:  [18 bmp] 18 bmp Vt Set:  [570 mL] 570 mL PEEP:  [5 cmH20] 5 cmH20 Plateau Pressure:  [16 cmH20] 16 cmH20  INTAKE / OUTPUT: I/O last 3 completed shifts: In: 2547.6 [I.V.:1812.6; Other:60; IV Piggyback:675] Out: [id number] [Urine:5640]  PHYSICAL EXAMINATION: General:  Elderly, chronically ill appearing male obtunded this AM. Neuro:  Withdraws to pain but otherwise  unresponsive. HEENT:  Ecchymosis to forehead and bilateral periorbits.  Cardiovascular:  s1 s2, regular, no MRG Lungs:  Upper airway sounds noted. Abdomen:  Soft, no r/g, non-distended Musculoskeletal:  No acute deformity Skin:  Grossly intact  LABS:  BMET  Recent Labs Lab 11/11/15 0600 11/12/15 0430 11/13/15 0356  NA 138 143 144  K 3.2* 3.2* 3.2*  CL 110 116* 111  CO2 21* 19* 23  BUN 16 15 9   CREATININE 0.84 0.87 1.07  GLUCOSE 184* 130* 123*   Electrolytes  Recent Labs Lab 11/11/15 0600 11/12/15 0430 11/13/15 0356  CALCIUM 7.7* 7.9* 8.3*  MG 2.2 2.0 1.3*  PHOS 2.8 2.3* 2.4*   CBC  Recent Labs Lab 11/11/15 0600 11/12/15 0430 11/13/15 0356  WBC 4.4 5.7 5.8  HGB 10.5* 9.4* 10.1*  HCT 30.4* 27.5* 30.5*  PLT 109* 128* 189   Coag's No results for input(s): APTT, INR in the last 168 hours.  Sepsis Markers  Recent Labs Lab 11/10/15 0111 11/10/15 1328 11/10/15 1930  LATICACIDVEN 2.2* 1.0 1.3   ABG  Recent Labs Lab 11/10/15 0338 11/10/15 0746 11/13/15 1007  PHART 7.247* 7.316* 7.498*  PCO2ART 38.0 35.1 26.4*  PO2ART 59.8* 397.0* 55.0*   Liver Enzymes  Recent Labs Lab 11/08/15 0916 11/09/15 2200 11/10/15 0111 11/11/15 0600  AST 50* 42* 40  --   ALT 11* 13* 16*  --   ALKPHOS 47 48 47  --  BILITOT 1.0 1.1 QUANTITY NOT SUFFICIENT, UNABLE TO PERFORM TEST  --   ALBUMIN 3.1* 3.5 3.3* 2.2*   Cardiac Enzymes No results for input(s): TROPONINI, PROBNP in the last 168 hours.  Glucose  Recent Labs Lab 11/11/15 2016 11/12/15 0350 11/12/15 0819 11/12/15 1240 11/13/15 0007 11/13/15 0343  GLUCAP 130* 109* 117* 104* 108* 122*   Imaging Dg Chest Port 1 View  11/12/2015  CLINICAL DATA:  Acute respiratory failure with hypoxia and altered mental status. EXAM: PORTABLE CHEST 1 VIEW COMPARISON:  11/12/2015 FINDINGS: Right internal jugular approach central venous catheter it is stable, tip at the expected location of distal superior vena cava.  Cardiomediastinal silhouette is normal. Mediastinal contours appear intact. Atherosclerotic disease of the aorta is seen. There is no evidence of scratched pleural effusion or pneumothorax. There has been interval development of bilateral with central predominance alveolar and interstitial opacities. Osseous structures are without acute abnormality. Soft tissues are grossly normal. IMPRESSION: Rapid development of bilateral with central predominant interstitial and alveolar airspace opacities, with appearance suggestive of ARDS or flash pulmonary edema. These results were called by telephone at the time of interpretation on 11/12/2015 at 6:52 pm to Cleon Dew, RN, who verbally acknowledged these results and will relayed the results to Dr. Lake Bells. Electronically Signed   By: Fidela Salisbury M.D.   On: 11/12/2015 18:53   STUDIES:  CT head, C-spine 12/20 > Advanced atrophy and microvascular ischemic disease without acute intracranial process. Suspected minimally displaced nasal bone fracture. No additional facial fractures identified on this motion degraded examination. Motion degraded examination without evidence of fracture or static subluxation of the cervical spine. Moderate to severe multilevel cervical spine DDD. CT head 12/23 > No acute intracranial finding. Atrophy and chronic small vessel disease. Forehead contusion and known nasal arch fracture.  CULTURES: Blood 12/20 > Urine 12/20 > GNR >>> Tracheal aspirate 12/23 >>>  ANTIBIOTICS: Zosyn 12/20 >>>12/26  SIGNIFICANT EVENTS: 12/20 fall, admit 12/22 tx to SDU for increasing lethargy 12/23 worsening MS, tx to ICU for intubation  LINES/TUBES: ETT 12/23 >>>12/24  ASSESSMENT / PLAN:  PULMONARY A: Acute hypoxemic respiratory failure COPD without evidence of acute exacerbation ILD on CT in past R/o aspiration / edema P:   Extubated 12/24, reintubated 12/26 F/U CXR. F/U ABG BDers PRN sedation.  CARDIOVASCULAR A:  H/o HTN,  HLD  P:  Telemetry Holding home antihypertensives, may need to resume soon [facility] IVF.  RENAL A:   High AG metabolic acidosis- lactic(resolved)Small NONAG Hypomagnesemia repleted  P:   Replace electrolytes as indicated. Follow BMP. KVO IVF.  GASTROINTESTINAL A:   NPO post intubation.  P:   Restart TF. Protonix for SUP.  HEMATOLOGIC A:   Thrombocytopenia, chronic - suspect secondary to alcohol abuse. Mild anemia (Hgb at baseline) H/o prostate Ca s/p TURP  P:  Follow CBC Heparin SQ for VTE ppx Follow plat trend and head hematoma  INFECTIOUS A:   Sepsis secondary to UTI  P:   ABX and cultures as above Was on rocephin for 3 days and zosyn for 4 days, Klebsiella was sensitive to both, total treatment days of 7 days complete.  ENDOCRINE CBG (last 3)   Recent Labs  11/12/15 1240 11/13/15 0007 11/13/15 0343  GLUCAP 104* 108* 122*     A:   Hypotension(resolved) R/o rel AI   P:   Follow glucose on BMP Cortisol low but low bp resolved  NEUROLOGIC A:   Acute metabolic encephalopathy Alcohol withdrawal Parkinsons P:   Fenatynyl  IV PRN IV thiamine, folate carbi/dopa when able, cant crush CIWA protocol now that patient is extubated. PRN versed.  FAMILY  - Updates: Need to call brother in [place] to discuss plan of care.  - Inter-disciplinary family meet or Palliative Care meeting due by:  11/17/2015  The patient is critically ill with multiple organ systems failure and requires high complexity decision making for assessment and support, frequent evaluation and titration of therapies, application of advanced monitoring technologies and extensive interpretation of multiple databases.   Critical Care Time devoted to patient care services described in this note is  35  Minutes. This time reflects time of care of this signee Dr [name]. This critical care time does not reflect procedure time, or teaching time or supervisory time of PA/NP/Med  student/Med Resident etc but could involve care discussion time.  Rush Farmer, M.D. [facility] Pulmonary/Critical Care Medicine. Pager: [phone]. After hours pager: [phone].

## 2015-11-13 NOTE — Progress Notes (Signed)
[place] TEAM 1 - Stepdown/ICU TEAM PROGRESS NOTE  Victor Clarke [id number] DOB: 1929/04/15 DOA: 11/07/2015 PCP: Aretta Nip, MD  Admit HPI / Brief Narrative: 81 year old male with hx HTN, HLD, COPD, EtOH abuse, and Parkinson's who had presented to ED several times for falls, and was found to be intoxicated each time. 12/20 he again presented to [facility] ED s/p falling down steps at home and was found down after an unknown time. He was altered and intoxicated in the ED. He had lacerations to the front of his scalp and forehead. UA was suspicious for UTI and he was started on ABX. CT head/cspine was obtained and showed no acute intracranial issues apart from a minimally displaced nasal bone fracture. No C-Spine fracture. He was admitted to the [facility] service and placed on CIWA protocol. His encephalopathy was improved 12/21, but then on 12/22 he again became more lethargic.  As the day/night went on his mental status progressively diminished and his respiratory status declined. He ultimately required transfer to the ICU.   Significant Events: 12/20 fall, admit 12/22 tx to SDU for increasing lethargy 12/23 worsening MS, tx to ICU for intubation 12/24 extubated   HPI/Subjective: Pt is obtunded.  Does not respond to voice or heavy physical stimulation.  His breathing is very shallow and ineffective.  It appears he needs to be acutely re-intubated. PCCM to resume care.  Pt to remain in ICU.     Assessment/Plan:  Acute hypoxemic respiratory failure  Pulmonary edema  CXR 12/25 significantly worsened - follow closely w/ ongoing diuresis - net negative ~4L last 24hrs   ?PNA Empiric abx coverage continues   Sepsis secondary to Kleb pneumoniae UTI Sepsis physiology resolved - abx tx continues   Acute metabolic encephalopathy - Alcohol withdrawal  COPD without evidence of acute exacerbation  Dysphagia  acute reversible oropharyngeal dysphagia with both cognitive and respiratory origins  per SLP - NPO except meds for now  Parkinsons  HTN BP variable - follow w/ ongoing diuresis   HLD  Hypomagnesemia  Replace and follow   Hypokalemia  Replace and follow   EtOH abuse   Chronic thrombocytopenia plt count climbing in absence of EtOH  H/o prostate CA s/p TURP  MRSA screen +  Code Status: FULL Family Communication: no family present at time of exam Disposition Plan: ICU - PCCM service   Consultants: PCCM  Antibiotics: Ceftriaxone 12/20 > 12/22 Zosyn 12/22 >  DVT prophylaxis: SQ heparin   Objective: Blood pressure 149/81, pulse 87, temperature 98.8 F (37.1 C), temperature source Oral, resp. rate 29, height 5\' 9"  (1.753 m), weight 65.8 kg (145 lb 1 oz), SpO2 97 %.  Intake/Output Summary (Last 24 hours) at 11/13/15 0922 Last data filed at 11/13/15 0900  Gross per 24 hour  Intake 1517.64 ml  Output   5550 ml  Net -4032.36 ml   Exam:   Data Reviewed: Basic Metabolic Panel:  Recent Labs Lab 11/08/15 0109  11/09/15 2200 11/10/15 0111 11/10/15 0354 11/10/15 1845 11/11/15 0600 11/12/15 0430 11/13/15 0356  NA  --   < > 140 141  --   --  138 143 144  K  --   < > 3.5 3.7  --   --  3.2* 3.2* 3.2*  CL  --   < > 106 108  --   --  110 116* 111  CO2  --   < > 21* 15*  --   --  21* 19* 23  GLUCOSE  --   < > 92 94  --   --  184* 130* 123*  BUN  --   < > 9 9  --   --  16 15 9   CREATININE  --   < > 0.90 0.83  --   --  0.84 0.87 1.07  CALCIUM  --   < > 8.8* 8.3*  --   --  7.7* 7.9* 8.3*  MG 1.2*  < >  --  QUANTITY NOT SUFFICIENT, UNABLE TO PERFORM TEST 1.6* 2.4 2.2 2.0 1.3*  PHOS 4.1  --   --   --   --  3.3 2.8 2.3* 2.4*  < > = values in this interval not displayed.  CBC:  Recent Labs Lab 11/07/15 1617  11/09/15 2200 11/10/15 0111 11/11/15 0600 11/12/15 0430 11/13/15 0356  WBC 4.8  < > 5.1 5.6 4.4 5.7 5.8  NEUTROABS 3.3  --  3.8  --  3.9  --   --   HGB 11.9*  < > 12.9* 12.5* 10.5* 9.4* 10.1*  HCT 34.4*  < > 38.5* 37.4* 30.4* 27.5* 30.5*    MCV 96.4  < > 97.0 97.7 96.8 96.2 96.2  PLT 105*  < > 107* 99* 109* 128* 189  < > = values in this interval not displayed.  Liver Function Tests:  Recent Labs Lab 11/07/15 1617 11/08/15 0916 11/09/15 2200 11/10/15 0111 11/11/15 0600  AST 76* 50* 42* 40  --   ALT 45 11* 13* 16*  --   ALKPHOS 65 47 48 47  --   BILITOT 0.4 1.0 1.1 QUANTITY NOT SUFFICIENT, UNABLE TO PERFORM TEST  --   PROT 6.4* 5.6* 6.8 6.5  --   ALBUMIN 3.5 3.1* 3.5 3.3* 2.2*    Recent Labs Lab 11/07/15 1611 11/09/15 2310  AMMONIA 29 23   Cardiac Enzymes:  Recent Labs Lab 11/07/15 1617  CKTOTAL 268    CBG:  Recent Labs Lab 11/12/15 0350 11/12/15 0819 11/12/15 1240 11/13/15 0007 11/13/15 0343  GLUCAP 109* 117* 104* 108* 122*    Recent Results (from the past 240 hour(s))  Culture, Urine     Status: None   Collection Time: 11/07/15  9:55 PM  Result Value Ref Range Status   Specimen Description URINE, RANDOM  Final   Special Requests NONE  Final   Culture >=100,000 COLONIES/mL KLEBSIELLA PNEUMONIAE  Final   Report Status 11/11/2015 FINAL  Final   Organism ID, Bacteria KLEBSIELLA PNEUMONIAE  Final      Susceptibility   Klebsiella pneumoniae - MIC*    AMPICILLIN >=32 RESISTANT Resistant     CEFAZOLIN <=4 SENSITIVE Sensitive     CEFTRIAXONE <=1 SENSITIVE Sensitive     CIPROFLOXACIN <=0.25 SENSITIVE Sensitive     GENTAMICIN <=1 SENSITIVE Sensitive     IMIPENEM <=0.25 SENSITIVE Sensitive     NITROFURANTOIN 32 SENSITIVE Sensitive     TRIMETH/SULFA <=20 SENSITIVE Sensitive     AMPICILLIN/SULBACTAM 4 SENSITIVE Sensitive     PIP/TAZO <=4 SENSITIVE Sensitive     * >=100,000 COLONIES/mL KLEBSIELLA PNEUMONIAE  MRSA PCR Screening     Status: Abnormal   Collection Time: 11/09/15  6:03 PM  Result Value Ref Range Status   MRSA by PCR POSITIVE (A) NEGATIVE Final    Comment:        The GeneXpert MRSA Assay (FDA approved for NASAL specimens only), is one component of a comprehensive MRSA  colonization surveillance program. It is not intended to  diagnose MRSA infection nor to guide or monitor treatment for MRSA infections. RESULT CALLED TO, READ BACK BY AND VERIFIED WITH: Annice Pih RN [id number] 11/09/15 A BROWNING   Culture, blood (Routine X 2) w Reflex to ID Panel     Status: None (Preliminary result)   Collection Time: 11/09/15 10:00 PM  Result Value Ref Range Status   Specimen Description BLOOD RIGHT HAND  Final   Special Requests BOTTLES DRAWN AEROBIC ONLY 6CC  Final   Culture NO GROWTH 3 DAYS  Final   Report Status PENDING  Incomplete  Culture, blood (Routine X 2) w Reflex to ID Panel     Status: None (Preliminary result)   Collection Time: 11/09/15 10:08 PM  Result Value Ref Range Status   Specimen Description BLOOD RIGHT HAND  Final   Special Requests BOTTLES DRAWN AEROBIC ONLY 5CC  Final   Culture NO GROWTH 3 DAYS  Final   Report Status PENDING  Incomplete  Culture, respiratory (NON-Expectorated)     Status: None (Preliminary result)   Collection Time: 11/11/15  3:37 AM  Result Value Ref Range Status   Specimen Description TRACHEAL ASPIRATE  Final   Special Requests Normal  Final   Gram Stain   Final    MODERATE WBC MODERATE SQUAMOUS EPITHELIAL CELLS PRESENT NO ORGANISMS SEEN Performed at [facility]    Culture   Final    NORMAL OROPHARYNGEAL FLORA Performed at [facility]    Report Status PENDING  Incomplete     Studies:   Recent x-ray studies have been reviewed in detail by the Attending Physician  Scheduled Meds:  Scheduled Meds: . antiseptic oral rinse  7 mL Mouth Rinse QID  . aspirin  81 mg Per Tube Daily  . atorvastatin  40 mg Per Tube Daily  . carbidopa-levodopa  0.5 tablet Per Tube TID  . chlorhexidine gluconate  15 mL Mouth Rinse BID  . Chlorhexidine Gluconate Cloth  6 each Topical Q0600  . diltiazem  120 mg Oral Daily  . folic acid  1 mg Oral Daily  . heparin subcutaneous  5,000 Units Subcutaneous 3 times per day   . insulin aspart  0-9 Units Subcutaneous 6 times per day  . LORazepam  2 mg Intravenous 3 times per day  . magnesium sulfate LVP 250-500 ml  6 g Intravenous Once  . multivitamin with minerals  1 tablet Per Tube Daily  . mupirocin ointment  1 application Nasal BID  . piperacillin-tazobactam (ZOSYN)  IV  3.375 g Intravenous 3 times per day  . pneumococcal 23 valent vaccine  0.5 mL Intramuscular Tomorrow-1000  . potassium chloride  10 mEq Intravenous Q1 Hr x 4  . thiamine  100 mg Oral Daily    Time spent on care of this patient: no charge   Cherene Altes , MD   [facility] Office  [phone] Pager - Text Page per Shea Evans as per below:  On-Call/Text Page:      [email or web address]      password [email or web address]  If 7PM-7AM, please contact night-coverage [email or web address] Password [email or web address] 11/13/2015, 9:22 AM   LOS: 5 days

## 2015-11-13 NOTE — Progress Notes (Signed)
PT Cancellation Note  Patient Details Name: Victor Clarke MRN: [id number] DOB: [birth year]   Cancelled Treatment:    Reason Eval/Treat Not Completed: Medical issues which prohibited therapy.  Pt newly intubated this am.  Will try to see pt 12/27 as able. 11/13/2015  Donnella Sham, [place] [phone]  (pager)   Arvon Schreiner, Tessie Fass 11/13/2015, 11:19 AM

## 2015-11-13 NOTE — Progress Notes (Signed)
ETT advanced 1.5cm per MD order

## 2015-11-13 NOTE — Procedures (Signed)
Intubation Procedure Note Victor Clarke [id number] 1929/08/01  Procedure: Intubation Indications: Airway protection and maintenance  Procedure Details Consent: Unable to obtain consent because of emergent medical necessity. Time Out: Verified patient identification, verified procedure, site/side was marked, verified correct patient position, special equipment/implants available, medications/allergies/relevent history reviewed, required imaging and test results available.  Performed  Maximum sterile technique was used including gloves, gown, hand hygiene and mask.  MAC    Evaluation Hemodynamic Status: BP stable throughout; O2 sats: stable throughout Patient's Current Condition: stable Complications: No apparent complications Patient did tolerate procedure well. Chest X-ray ordered to verify placement.  CXR: pending.   Victor Clarke 11/13/2015

## 2015-11-13 NOTE — Progress Notes (Signed)
[facility] ADULT ICU REPLACEMENT PROTOCOL FOR AM LAB REPLACEMENT ONLY  The patient does apply for the [facility] Adult ICU Electrolyte Replacment Protocol based on the criteria listed below:   1. Is GFR >/= 40 ml/min? Yes.    Patient's GFR today is >60 2. Is urine output >/= 0.5 ml/kg/hr for the last 6 hours? Yes.   Patient's UOP is 5.0 ml/kg/hr 3. Is BUN < 60 mg/dL? Yes.    Patient's BUN today is 9 4. Abnormal electrolyte(s): K 3.2, Mag 1.3 5. Ordered repletion with: per protocol 6. If a panic level lab has been reported, has the CCM MD in charge been notified? No..   Physician:    Ronda Fairly A 11/13/2015 5:13 AM

## 2015-11-13 NOTE — Progress Notes (Signed)
SLP Cancellation Note  Patient Details Name: Victor Clarke MRN: [id number] DOB: 21-Jul-1929   Cancelled treatment:       Reason Eval/Treat Not Completed: Fatigue/lethargy limiting ability to participate. Per RN, decreased responsiveness today as compared to 12/25. Will f/u 12/27.   [place], CCC-SLP [phone]    [name] 11/13/2015, 10:29 AM

## 2015-11-14 ENCOUNTER — Inpatient Hospital Stay (HOSPITAL_COMMUNITY): Payer: Medicare Other

## 2015-11-14 DIAGNOSIS — J9601 Acute respiratory failure with hypoxia: Secondary | ICD-10-CM | POA: Insufficient documentation

## 2015-11-14 LAB — BLOOD GAS, ARTERIAL
Acid-base deficit: 1.1 mmol/L (ref 0.0–2.0)
BICARBONATE: 22.4 meq/L (ref 20.0–24.0)
Drawn by: 441661
FIO2: 0.4
LHR: 14 {breaths}/min
O2 SAT: 97.9 %
PATIENT TEMPERATURE: 99.7
PCO2 ART: 33.9 mmHg — AB (ref 35.0–45.0)
PEEP: 5 cmH2O
PH ART: 7.438 (ref 7.350–7.450)
TCO2: 23.4 mmol/L (ref 0–100)
VT: 570 mL
pO2, Arterial: 105 mmHg — ABNORMAL HIGH (ref 80.0–100.0)

## 2015-11-14 LAB — GLUCOSE, CAPILLARY
GLUCOSE-CAPILLARY: 106 mg/dL — AB (ref 65–99)
GLUCOSE-CAPILLARY: 108 mg/dL — AB (ref 65–99)
GLUCOSE-CAPILLARY: 110 mg/dL — AB (ref 65–99)
GLUCOSE-CAPILLARY: 124 mg/dL — AB (ref 65–99)
GLUCOSE-CAPILLARY: 156 mg/dL — AB (ref 65–99)
Glucose-Capillary: 112 mg/dL — ABNORMAL HIGH (ref 65–99)
Glucose-Capillary: 103 mg/dL — ABNORMAL HIGH (ref 65–99)
Glucose-Capillary: 107 mg/dL — ABNORMAL HIGH (ref 65–99)
Glucose-Capillary: 112 mg/dL — ABNORMAL HIGH (ref 65–99)
Glucose-Capillary: 113 mg/dL — ABNORMAL HIGH (ref 65–99)
Glucose-Capillary: 138 mg/dL — ABNORMAL HIGH (ref 65–99)
Glucose-Capillary: 149 mg/dL — ABNORMAL HIGH (ref 65–99)
Glucose-Capillary: 155 mg/dL — ABNORMAL HIGH (ref 65–99)
Glucose-Capillary: 164 mg/dL — ABNORMAL HIGH (ref 65–99)
Glucose-Capillary: 198 mg/dL — ABNORMAL HIGH (ref 65–99)
Glucose-Capillary: 89 mg/dL (ref 65–99)

## 2015-11-14 LAB — BASIC METABOLIC PANEL
Anion gap: 8 (ref 5–15)
BUN: 10 mg/dL (ref 6–20)
CALCIUM: 7.7 mg/dL — AB (ref 8.9–10.3)
CO2: 25 mmol/L (ref 22–32)
CREATININE: 0.96 mg/dL (ref 0.61–1.24)
Chloride: 110 mmol/L (ref 101–111)
Glucose, Bld: 115 mg/dL — ABNORMAL HIGH (ref 65–99)
Potassium: 2.8 mmol/L — ABNORMAL LOW (ref 3.5–5.1)
SODIUM: 143 mmol/L (ref 135–145)

## 2015-11-14 LAB — CBC
HEMATOCRIT: 26.9 % — AB (ref 39.0–52.0)
Hemoglobin: 8.8 g/dL — ABNORMAL LOW (ref 13.0–17.0)
MCH: 31.5 pg (ref 26.0–34.0)
MCHC: 32.7 g/dL (ref 30.0–36.0)
MCV: 96.4 fL (ref 78.0–100.0)
Platelets: 172 10*3/uL (ref 150–400)
RBC: 2.79 MIL/uL — AB (ref 4.22–5.81)
RDW: 18.4 % — AB (ref 11.5–15.5)
WBC: 4.2 10*3/uL (ref 4.0–10.5)

## 2015-11-14 LAB — CULTURE, BLOOD (ROUTINE X 2)
CULTURE: NO GROWTH
CULTURE: NO GROWTH

## 2015-11-14 LAB — MAGNESIUM: MAGNESIUM: 2.2 mg/dL (ref 1.7–2.4)

## 2015-11-14 LAB — CULTURE, RESPIRATORY W GRAM STAIN: Culture: NORMAL

## 2015-11-14 LAB — PHOSPHORUS: Phosphorus: 4.2 mg/dL (ref 2.5–4.6)

## 2015-11-14 LAB — CULTURE, RESPIRATORY: SPECIAL REQUESTS: NORMAL

## 2015-11-14 MED ORDER — FENTANYL CITRATE (PF) 100 MCG/2ML IJ SOLN: 50.0000 ug | Freq: Once | INTRAMUSCULAR | Status: DC

## 2015-11-14 MED ORDER — FENTANYL BOLUS VIA INFUSION
25.0000 ug | INTRAVENOUS | Status: DC | PRN
  Filled 2015-11-14: qty 25

## 2015-11-14 MED ORDER — SODIUM CHLORIDE 0.9 % IV SOLN
25.0000 ug/h | INTRAVENOUS | Status: DC
  Administered 2015-11-14: 50 ug/h via INTRAVENOUS
  Filled 2015-11-14: qty 50

## 2015-11-14 MED ORDER — FUROSEMIDE 10 MG/ML IJ SOLN
40.0000 mg | Freq: Four times a day (QID) | INTRAMUSCULAR | Status: AC
  Administered 2015-11-14 – 2015-11-15 (×3): 40 mg via INTRAVENOUS
  Filled 2015-11-14 (×3): qty 4

## 2015-11-14 MED ORDER — POTASSIUM CHLORIDE 20 MEQ/15ML (10%) PO SOLN
40.0000 meq | Freq: Three times a day (TID) | ORAL | Status: AC
Start: 2015-11-14 — End: 2015-11-14
  Administered 2015-11-14 (×2): 40 meq
  Filled 2015-11-14 (×2): qty 30

## 2015-11-14 MED ORDER — VITAL AF 1.2 CAL PO LIQD
1000.0000 mL | ORAL | Status: DC
  Administered 2015-11-14: 1000 mL
  Filled 2015-11-14 (×5): qty 1000

## 2015-11-14 MED ORDER — POTASSIUM CHLORIDE 10 MEQ/50ML IV SOLN
10.0000 meq | INTRAVENOUS | Status: AC
  Administered 2015-11-14 (×4): 10 meq via INTRAVENOUS
  Filled 2015-11-14 (×4): qty 50

## 2015-11-14 MED ORDER — POTASSIUM CHLORIDE 20 MEQ/15ML (10%) PO SOLN
40.0000 meq | ORAL | Status: AC
  Administered 2015-11-14 (×2): 40 meq
  Filled 2015-11-14: qty 30

## 2015-11-14 MED ORDER — PRO-STAT SUGAR FREE PO LIQD
30.0000 mL | Freq: Two times a day (BID) | ORAL | Status: AC
  Administered 2015-11-14 (×2): 30 mL
  Filled 2015-11-14 (×2): qty 30

## 2015-11-14 MED ORDER — POTASSIUM CHLORIDE 20 MEQ/15ML (10%) PO SOLN
ORAL | Status: AC
  Filled 2015-11-14: qty 30

## 2015-11-14 MED FILL — Medication: Qty: 1 | Status: AC

## 2015-11-14 NOTE — Progress Notes (Signed)
PULMONARY / CRITICAL CARE MEDICINE   Name: Victor Clarke MRN: [id number] DOB: May 06, 1929    ADMISSION DATE:  11/07/2015 CONSULTATION DATE:  11/07/2015  REFERRING MD:  Dr. Sloan Leiter  CHIEF COMPLAINT:  AMS  HISTORY OF PRESENT ILLNESS:   81 year old male with PMH as below, which includes HTN, HLD, COPD, and Parkinson's. He also has a history of ETOH abuse and has presented to ED several times recently for fall and was found to be intoxicated each time. 12/20 he again presented to [facility] ED s/p falling down steps at home and was found down for unknown time. He was altered and intoxicated in ED. He had lacerations to the front of his scalp and forehead. UA was suspicious for UTI and he was started on ABX. CT head/cspine was obtained and showed no acute intracranial issues. Minimally displaced nasal bone fracture. No C-Spine fracture. He was improved to the hospitalis team and was placed on CIWA protocol. His encephalopathy was much improved the following day 12/21. 12/22 he was becoming more lethargic and was moved to SDU for closer monitoring, however he was still alert and able to follow commands. As the night went on his mental status progressively diminished and his respiratory status decline. PCCM consulted for further evaluation.   SUBJECTIVE:  Arousable but not following commands.  VITAL SIGNS: BP 143/77 mmHg  Pulse 72  Temp(Src) 98.3 F (36.8 C) (Oral)  Resp 24  Ht 5\' 9"  (1.753 m)  Wt 65.8 kg (145 lb 1 oz)  BMI 21.41 kg/m2  SpO2 98%  HEMODYNAMICS:    VENTILATOR SETTINGS: Vent Mode:  [-] PSV FiO2 (%):  [40 %] 40 % Set Rate:  [14 bmp] 14 bmp Vt Set:  [570 mL] 570 mL PEEP:  [5 cmH20] 5 cmH20 Plateau Pressure:  [10 cmH20-14 cmH20] 14 cmH20  INTAKE / OUTPUT: I/O last 3 completed shifts: In: 1547.6 [I.V.:352.6; Other:130; NG/GT:90; IV Piggyback:975] Out: 5006 [Urine:5006]  PHYSICAL EXAMINATION: General:  Elderly, chronically ill appearing male obtunded this AM. Neuro:   Withdraws to pain but otherwise unresponsive. HEENT:  Ecchymosis to forehead and bilateral periorbits.  Cardiovascular:  s1 s2, regular, no MRG Lungs:  Upper airway sounds noted. Abdomen:  Soft, no r/g, non-distended Musculoskeletal:  No acute deformity Skin:  Grossly intact  LABS:  BMET  Recent Labs Lab 11/12/15 0430 11/13/15 0356 11/14/15 0421  NA 143 144 143  K 3.2* 3.2* 2.8*  CL 116* 111 110  CO2 19* 23 25  BUN 15 9 10   CREATININE 0.87 1.07 0.96  GLUCOSE 130* 123* 115*   Electrolytes  Recent Labs Lab 11/12/15 0430 11/13/15 0356 11/13/15 2024 11/14/15 0421  CALCIUM 7.9* 8.3*  --  7.7*  MG 2.0 1.3* 2.4 2.2  PHOS 2.3* 2.4*  --  4.2   CBC  Recent Labs Lab 11/12/15 0430 11/13/15 0356 11/14/15 0421  WBC 5.7 5.8 4.2  HGB 9.4* 10.1* 8.8*  HCT 27.5* 30.5* 26.9*  PLT 128* 189 172   Coag's No results for input(s): APTT, INR in the last 168 hours.  Sepsis Markers  Recent Labs Lab 11/10/15 0111 11/10/15 1328 11/10/15 1930  LATICACIDVEN 2.2* 1.0 1.3   ABG  Recent Labs Lab 11/13/15 1007 11/13/15 1221 11/14/15 0435  PHART 7.498* 7.467* 7.438  PCO2ART 26.4* 32.4* 33.9*  PO2ART 55.0* 412.0* 105*   Liver Enzymes  Recent Labs Lab 11/08/15 0916 11/09/15 2200 11/10/15 0111 11/11/15 0600  AST 50* 42* 40  --   ALT 11* 13* 16*  --  ALKPHOS 47 48 47  --   BILITOT 1.0 1.1 QUANTITY NOT SUFFICIENT, UNABLE TO PERFORM TEST  --   ALBUMIN 3.1* 3.5 3.3* 2.2*   Cardiac Enzymes No results for input(s): TROPONINI, PROBNP in the last 168 hours.  Glucose  Recent Labs Lab 11/13/15 0007 11/13/15 0343 11/13/15 1207 11/13/15 1631 11/13/15 2046 11/14/15 0811  GLUCAP 108* 122* 123* 137* 121* 112*   Imaging Dg Chest Port 1 View  11/14/2015  CLINICAL DATA:  Hypoxia EXAM: PORTABLE CHEST 1 VIEW COMPARISON:  November 13, 2015 FINDINGS: Endotracheal tube tip is 4.4 cm above the carina. Nasogastric tube tip and side port are in the stomach; side-port is seen  slightly inferior to the gastroesophageal junction. Central catheter tip is in the superior vena cava slightly proximal to the cavoatrial junction. No pneumothorax. There remains interstitial edema throughout the lungs bilaterally with the greatest concentration of edema in the bases. There is patchy alveolar opacity in the bases, likely alveolar edema, although superimposed pneumonia cannot be excluded. Heart is borderline enlarged with pulmonary vascularity within normal limits. There are small pleural effusions bilaterally. IMPRESSION: Tube and catheter positions as described without pneumothorax. Slight increase in bibasilar interstitial and patchy alveolar opacity, likely edema, although superimposed pneumonia in the bases cannot be excluded. The overall appearance is most consistent with a degree of congestive heart failure. Electronically Signed   By: Lowella Grip III M.D.   On: 11/14/2015 07:21   Dg Chest Port 1 View  11/13/2015  CLINICAL DATA:  Endotracheal tube and nasogastric tube placement. EXAM: PORTABLE CHEST 1 VIEW COMPARISON:  November 12, 2015 FINDINGS: The heart size and mediastinal contours are within normal limits. There is pulmonary edema, decreased compared to prior exam. Patchy consolidation of left lung base is identified. There is a small left pleural effusion. Endotracheal tube is identified with distal tip 5.9 cm from carina. Right central venous line is noted with distal tip in superior vena cava. Nasogastric tube is identified with distal tip not included on the film but is probably at least in the stomach. The visualized skeletal structures are stable. IMPRESSION: Interval decrease pulmonary edema. Findings suspicious for left lung base pneumonia with left pleural effusion. Endotracheal tube in good position. There is no pneumothorax. Nasogastric tube is identified with distal tip not included but is at least in the stomach. Electronically Signed   By: Abelardo Diesel M.D.   On:  11/13/2015 12:26   STUDIES:  CT head, C-spine 12/20 > Advanced atrophy and microvascular ischemic disease without acute intracranial process. Suspected minimally displaced nasal bone fracture. No additional facial fractures identified on this motion degraded examination. Motion degraded examination without evidence of fracture or static subluxation of the cervical spine. Moderate to severe multilevel cervical spine DDD. CT head 12/23 > No acute intracranial finding. Atrophy and chronic small vessel disease. Forehead contusion and known nasal arch fracture.  CULTURES: Blood 12/20 > Urine 12/20 > GNR >>> Tracheal aspirate 12/23 >>>  ANTIBIOTICS: Zosyn 12/20 >>>12/26  SIGNIFICANT EVENTS: 12/20 fall, admit 12/22 tx to SDU for increasing lethargy 12/23 worsening MS, tx to ICU for intubation 12/26 extubated then promptly reintubated for AMS and unable to protect airway.  LINES/TUBES: ETT 12/23 >>>12/24>>>12/24>>>12/26>>>  ASSESSMENT / PLAN:  PULMONARY A: Acute hypoxemic respiratory failure COPD without evidence of acute exacerbation ILD on CT in past R/o aspiration / edema P:   Patient failing extubation due to mental status concerns with alcohol withdrawal.  I am concerned that patient will not be  able to protect his airway long term.  Will call family today to determine trach/peg vs extubate when ready with no intention to reintubate. F/U CXR. F/U ABG BDers PRN sedation. PS trials as able.  CARDIOVASCULAR A:  H/o HTN, HLD  P:  Telemetry. Holding home antihypertensives, may need to resume soon. KVO IVF.  RENAL A:   High AG metabolic acidosis- lactic(resolved)Small NONAG Hypomagnesemia repleted  P:   Replace electrolytes as indicated. Follow BMP. KVO IVF. Lasix 40 mg IV vq6 x3 doses.  GASTROINTESTINAL A:   No active issues.  P:   Continue TF. Protonix for SUP.  HEMATOLOGIC A:   Thrombocytopenia, chronic - suspect secondary to alcohol abuse. Mild anemia  (Hgb at baseline) H/o prostate Ca s/p TURP  P:  Follow CBC. Heparin SQ for VTE ppx. Follow plat trend and head hematoma.  INFECTIOUS A:   Sepsis secondary to UTI  P:   ABX and cultures as above Was on rocephin for 3 days and zosyn for 4 days, Klebsiella was sensitive to both, total treatment days of 7 days complete.  ENDOCRINE CBG (last 3)   Recent Labs  11/13/15 1631 11/13/15 2046 11/14/15 0811  GLUCAP 137* 121* 112*  A:   Hypotension(resolved) R/o rel AI   P:   Follow glucose on BMP Cortisol low but low bp resolved  NEUROLOGIC A:   Acute metabolic encephalopathy Alcohol withdrawal Parkinsons P:   Fenatynyl IV PRN IV thiamine, folate carbi/dopa when able, cant crush CIWA protocol now that patient is extubated. PRN versed.  FAMILY  - Updates: Spoke with brother and niece, LCB and once extubated DNR status, no trach/peg, one way extubation when ready.  - Inter-disciplinary family meet or Palliative Care meeting due by:  11/17/2015  The patient is critically ill with multiple organ systems failure and requires high complexity decision making for assessment and support, frequent evaluation and titration of therapies, application of advanced monitoring technologies and extensive interpretation of multiple databases.   Critical Care Time devoted to patient care services described in this note is  35  Minutes. This time reflects time of care of this signee Dr [name]. This critical care time does not reflect procedure time, or teaching time or supervisory time of PA/NP/Med student/Med Resident etc but could involve care discussion time.  Rush Farmer, M.D. [facility] Pulmonary/Critical Care Medicine. Pager: [phone]. After hours pager: [phone].

## 2015-11-14 NOTE — Progress Notes (Signed)
abg collected  

## 2015-11-14 NOTE — Progress Notes (Signed)
SLP Cancellation Note  Patient Details Name: Victor Clarke MRN: [id number] DOB: 1929/01/07   Cancelled treatment:       Reason Eval/Treat Not Completed: Medical issues which prohibited therapy. Pt intubated   Karn Derk, Katherene Ponto 11/14/2015, 7:54 AM

## 2015-11-14 NOTE — Progress Notes (Signed)
eLink Physician-Brief Progress Note Patient Name: Victor Clarke DOB: 11-30-1928 MRN: [id number]   Date of Service  11/14/2015  HPI/Events of Note  Hypokalemia  eICU Interventions  Potassium replaced     Intervention Category Intermediate Interventions: Electrolyte abnormality - evaluation and management  DETERDING,ELIZABETH 11/14/2015, 5:39 AM

## 2015-11-14 NOTE — Progress Notes (Signed)
Nutrition Follow-up / Consult  DOCUMENTATION CODES:   Non-severe (moderate) malnutrition in context of chronic illness  INTERVENTION:    Initiate TF via OGT with Vital AF 1.2 at 25 ml/h and Prostat 30 ml BID on day 1; on day 2, d/c Prostat and increase to goal rate of 60 ml/h (1440 ml per day) to provide 1728 kcals, 108 gm protein, 1168 ml free water daily.  NUTRITION DIAGNOSIS:   Inadequate oral intake related to inability to eat as evidenced by NPO status.  Ongoing  GOAL:   Patient will meet greater than or equal to 90% of their needs  Unmet  MONITOR:   Vent status, TF tolerance, Weight trends, Labs, Skin, I & O's  REASON FOR ASSESSMENT:   Consult Enteral/tube feeding initiation and management  ASSESSMENT:   81 year old male with past medical history significant for alcohol abuse, HTN, HLD, gout, COPD, and Parkinson's disease; who presents after falling down the steps of his home and being found down for some unknown time. Patient was intoxicated with altered mentation on presentation.   Labs reviewed: potassium low. Nutrition-Focused physical exam completed. Findings are mild fat depletion, mild-moderate and severe muscle depletion, and no edema. Patient with moderate PCM. Patient was extubated on 12/24, then required re-intubation on 12/26. Received MD Consult for TF initiation and management. OGT in place with tip in the stomach.  Patient is currently intubated on ventilator support MV: 8.1 L/min Temp (24hrs), Avg:99.6 F (37.6 C), Min:97.7 F (36.5 C), Max:101 F (38.3 C)  Propofol: none   Diet Order:   NPO  Skin:  Wound (see comment) (Head laceration)  Last BM:  12/25  Height:   Ht Readings from Last 1 Encounters:  11/08/15 5\' 9"  (1.753 m)    Weight:   Wt Readings from Last 1 Encounters:  11/13/15 145 lb 1 oz (65.8 kg)    Ideal Body Weight:  72.7 kg  BMI:  Body mass index is 21.41 kg/(m^2).  Estimated Nutritional Needs:   Kcal:   1726  Protein:  100-115 gm  Fluid:  2 L  EDUCATION NEEDS:   No education needs identified at this time  Molli Barrows, [place], [place], [place] Pager [phone] After Hours Pager [phone]

## 2015-11-15 ENCOUNTER — Inpatient Hospital Stay (HOSPITAL_COMMUNITY): Payer: Medicare Other

## 2015-11-15 DIAGNOSIS — E440 Moderate protein-calorie malnutrition: Secondary | ICD-10-CM | POA: Insufficient documentation

## 2015-11-15 DIAGNOSIS — D696 Thrombocytopenia, unspecified: Secondary | ICD-10-CM

## 2015-11-15 LAB — BLOOD GAS, ARTERIAL
ACID-BASE EXCESS: 3 mmol/L — AB (ref 0.0–2.0)
BICARBONATE: 26.2 meq/L — AB (ref 20.0–24.0)
Drawn by: 39898
FIO2: 0.4
LHR: 14 {breaths}/min
O2 SAT: 97.2 %
PEEP/CPAP: 5 cmH2O
PH ART: 7.496 — AB (ref 7.350–7.450)
Patient temperature: 98.6
TCO2: 27.2 mmol/L (ref 0–100)
VT: 580 mL
pCO2 arterial: 34.2 mmHg — ABNORMAL LOW (ref 35.0–45.0)
pO2, Arterial: 88.8 mmHg (ref 80.0–100.0)

## 2015-11-15 LAB — GLUCOSE, CAPILLARY
GLUCOSE-CAPILLARY: 127 mg/dL — AB (ref 65–99)
GLUCOSE-CAPILLARY: 141 mg/dL — AB (ref 65–99)
Glucose-Capillary: 102 mg/dL — ABNORMAL HIGH (ref 65–99)
Glucose-Capillary: 111 mg/dL — ABNORMAL HIGH (ref 65–99)
Glucose-Capillary: 138 mg/dL — ABNORMAL HIGH (ref 65–99)
Glucose-Capillary: 176 mg/dL — ABNORMAL HIGH (ref 65–99)

## 2015-11-15 LAB — CBC
HEMATOCRIT: 30.9 % — AB (ref 39.0–52.0)
HEMOGLOBIN: 10.1 g/dL — AB (ref 13.0–17.0)
MCH: 32.1 pg (ref 26.0–34.0)
MCHC: 32.7 g/dL (ref 30.0–36.0)
MCV: 98.1 fL (ref 78.0–100.0)
Platelets: 231 10*3/uL (ref 150–400)
RBC: 3.15 MIL/uL — ABNORMAL LOW (ref 4.22–5.81)
RDW: 18.4 % — AB (ref 11.5–15.5)
WBC: 5.6 10*3/uL (ref 4.0–10.5)

## 2015-11-15 LAB — MAGNESIUM: Magnesium: 1.6 mg/dL — ABNORMAL LOW (ref 1.7–2.4)

## 2015-11-15 LAB — PHOSPHORUS: PHOSPHORUS: 2.7 mg/dL (ref 2.5–4.6)

## 2015-11-15 LAB — BASIC METABOLIC PANEL
ANION GAP: 10 (ref 5–15)
BUN: 17 mg/dL (ref 6–20)
CALCIUM: 8.7 mg/dL — AB (ref 8.9–10.3)
CHLORIDE: 107 mmol/L (ref 101–111)
CO2: 27 mmol/L (ref 22–32)
Creatinine, Ser: 1.03 mg/dL (ref 0.61–1.24)
GFR calc Af Amer: >60 mL/min (ref >60)
GFR calc non Af Amer: >60 mL/min (ref >60)
GLUCOSE: 163 mg/dL — AB (ref 65–99)
POTASSIUM: 4.3 mmol/L (ref 3.5–5.1)
Sodium: 144 mmol/L (ref 135–145)

## 2015-11-15 MED ORDER — POTASSIUM CHLORIDE 20 MEQ/15ML (10%) PO SOLN
40.0000 meq | Freq: Three times a day (TID) | ORAL | Status: AC
  Administered 2015-11-15 (×2): 40 meq
  Filled 2015-11-15 (×2): qty 30

## 2015-11-15 MED ORDER — MAGNESIUM SULFATE 2 GM/50ML IV SOLN
2.0000 g | Freq: Once | INTRAVENOUS | Status: AC
  Administered 2015-11-15: 2 g via INTRAVENOUS
  Filled 2015-11-15: qty 50

## 2015-11-15 MED ORDER — DILTIAZEM 12 MG/ML ORAL SUSPENSION
30.0000 mg | Freq: Four times a day (QID) | ORAL | Status: DC
  Filled 2015-11-15 (×4): qty 3

## 2015-11-15 MED ORDER — DILTIAZEM 12 MG/ML ORAL SUSPENSION
30.0000 mg | Freq: Four times a day (QID) | ORAL | Status: DC
  Administered 2015-11-15 – 2015-11-16 (×6): 30 mg
  Filled 2015-11-15 (×12): qty 3

## 2015-11-15 MED ORDER — FENTANYL CITRATE (PF) 100 MCG/2ML IJ SOLN
25.0000 ug | INTRAMUSCULAR | Status: DC | PRN
  Administered 2015-11-15 – 2015-11-16 (×5): 100 ug via INTRAVENOUS
  Filled 2015-11-15 (×5): qty 2

## 2015-11-15 MED ORDER — FUROSEMIDE 10 MG/ML IJ SOLN
40.0000 mg | Freq: Four times a day (QID) | INTRAMUSCULAR | Status: AC
  Administered 2015-11-15 – 2015-11-16 (×3): 40 mg via INTRAVENOUS
  Filled 2015-11-15 (×3): qty 4

## 2015-11-15 NOTE — Progress Notes (Signed)
PULMONARY / CRITICAL CARE MEDICINE   Name: Victor Clarke MRN: [id number] DOB: [birth year]    ADMISSION DATE:  11/07/2015 CONSULTATION DATE:  11/07/2015  REFERRING MD:  Dr. Sloan Leiter  CHIEF COMPLAINT:  AMS  HISTORY OF PRESENT ILLNESS:   81 year old male with PMH as below, which includes HTN, HLD, COPD, and Parkinson's. He also has a history of ETOH abuse and has presented to ED several times recently for fall and was found to be intoxicated each time. 12/20 he again presented to [facility] ED s/p falling down steps at home and was found down for unknown time. He was altered and intoxicated in ED. He had lacerations to the front of his scalp and forehead. UA was suspicious for UTI and he was started on ABX. CT head/cspine was obtained and showed no acute intracranial issues. Minimally displaced nasal bone fracture. No C-Spine fracture. He was improved to the hospitalis team and was placed on CIWA protocol. His encephalopathy was much improved the following day 12/21. 12/22 he was becoming more lethargic and was moved to SDU for closer monitoring, however he was still alert and able to follow commands. As the night went on his mental status progressively diminished and his respiratory status decline. PCCM consulted for further evaluation.   SUBJECTIVE:  Arousable but not following commands.  VITAL SIGNS: BP 145/73 mmHg  Pulse 73  Temp(Src) 99.3 F (37.4 C) (Oral)  Resp 21  Ht 5\' 9"  (1.753 m)  Wt 61.5 kg (135 lb 9.3 oz)  BMI 20.01 kg/m2  SpO2 96%  HEMODYNAMICS:    VENTILATOR SETTINGS: Vent Mode:  [-] PSV;CPAP FiO2 (%):  [40 %-50 %] 40 % Set Rate:  [14 bmp] 14 bmp Vt Set:  [580 mL] 580 mL PEEP:  [5 cmH20] 5 cmH20 Pressure Support:  [15 cmH20] 15 cmH20 Plateau Pressure:  [14 cmH20-17 cmH20] 17 cmH20  INTAKE / OUTPUT: I/O last 3 completed shifts: In: 1421.5 [I.V.:416.3; Other:70; NG/GT:685.3; IV Piggyback:250] Out: [id number] [Urine:4641]  PHYSICAL EXAMINATION: General:  Elderly, chronically  ill appearing male obtunded this AM. Neuro:  Withdraws to pain but otherwise unresponsive. HEENT:  Ecchymosis to forehead and bilateral periorbits.  Cardiovascular:  s1 s2, regular, no MRG Lungs:  Upper airway sounds noted. Abdomen:  Soft, no r/g, non-distended Musculoskeletal:  No acute deformity Skin:  Grossly intact  LABS:  BMET  Recent Labs Lab 11/13/15 0356 11/14/15 0421 11/15/15 0330  NA 144 143 144  K 3.2* 2.8* 4.3  CL 111 110 107  CO2 23 25 27   BUN 9 10 17   CREATININE 1.07 0.96 1.03  GLUCOSE 123* 115* 163*   Electrolytes  Recent Labs Lab 11/13/15 0356 11/13/15 2024 11/14/15 0421 11/15/15 0330  CALCIUM 8.3*  --  7.7* 8.7*  MG 1.3* 2.4 2.2 1.6*  PHOS 2.4*  --  4.2 2.7   CBC  Recent Labs Lab 11/13/15 0356 11/14/15 0421 11/15/15 0330  WBC 5.8 4.2 5.6  HGB 10.1* 8.8* 10.1*  HCT 30.5* 26.9* 30.9*  PLT 189 172 231   Coag's No results for input(s): APTT, INR in the last 168 hours.  Sepsis Markers  Recent Labs Lab 11/10/15 0111 11/10/15 1328 11/10/15 1930  LATICACIDVEN 2.2* 1.0 1.3   ABG  Recent Labs Lab 11/13/15 1221 11/14/15 0435 11/15/15 0348  PHART 7.467* 7.438 7.496*  PCO2ART 32.4* 33.9* 34.2*  PO2ART 412.0* 105* 88.8   Liver Enzymes  Recent Labs Lab 11/09/15 2200 11/10/15 0111 11/11/15 0600  AST 42* 40  --  ALT 13* 16*  --   ALKPHOS 48 47  --   BILITOT 1.1 QUANTITY NOT SUFFICIENT, UNABLE TO PERFORM TEST  --   ALBUMIN 3.5 3.3* 2.2*   Cardiac Enzymes No results for input(s): TROPONINI, PROBNP in the last 168 hours.  Glucose  Recent Labs Lab 11/14/15 1546 11/14/15 2015 11/14/15 2345 11/15/15 0321 11/15/15 0812 11/15/15 1136  GLUCAP 106* 124* 111* 138* 176* 102*   Imaging Dg Chest Port 1 View  11/15/2015  CLINICAL DATA:  Intubation . EXAM: PORTABLE CHEST 1 VIEW COMPARISON:  11/14/2015 . FINDINGS: Endotracheal tube, NG tube, right IJ line in stable position. Cardiomegaly again noted. Diffuse bilateral pulmonary  infiltrates again noted. No interim change. No pleural effusion pneumothorax P IMPRESSION: 1. Lines and tubes stable position. 2. Persistent diffuse bilateral pulmonary infiltrates, no change from prior exam. Findings consistent with bilateral pneumonia and/or pulmonary edema. 3. Stable cardiomegaly. Electronically Signed   By: Marcello Moores  Register   On: 11/15/2015 07:20   STUDIES:  CT head, C-spine 12/20 > Advanced atrophy and microvascular ischemic disease without acute intracranial process. Suspected minimally displaced nasal bone fracture. No additional facial fractures identified on this motion degraded examination. Motion degraded examination without evidence of fracture or static subluxation of the cervical spine. Moderate to severe multilevel cervical spine DDD. CT head 12/23 > No acute intracranial finding. Atrophy and chronic small vessel disease. Forehead contusion and known nasal arch fracture.  CULTURES: Blood 12/20 > Urine 12/20 > GNR >>> Tracheal aspirate 12/23 >>>  ANTIBIOTICS: Zosyn 12/20 >>>12/26  SIGNIFICANT EVENTS: 12/20 fall, admit 12/22 tx to SDU for increasing lethargy 12/23 worsening MS, tx to ICU for intubation 12/26 extubated then promptly reintubated for AMS and unable to protect airway.  LINES/TUBES: ETT 12/23 >>>12/24>>>12/24>>>12/26>>>  ASSESSMENT / PLAN:  PULMONARY A: Acute hypoxemic respiratory failure COPD without evidence of acute exacerbation ILD on CT in past R/o aspiration / edema P:   Patient failing extubation due to mental status concerns with alcohol withdrawal.  I am concerned that patient will not be able to protect his airway long term.  Family does not wish for trach/peg, will optimize and extubate, LCB for now. F/U CXR. F/U ABG BDers PRN sedation. PS trials as able.  CARDIOVASCULAR A:  H/o HTN, HLD  P:  Telemetry. Holding home antihypertensives, may need to resume soon. KVO IVF.  RENAL A:   High AG metabolic acidosis-  lactic(resolved)Small NONAG Hypomagnesemia repleted  P:   Replace electrolytes as indicated. Follow BMP. KVO IVF. Lasix 40 mg IV vq6 x3 doses.  GASTROINTESTINAL A:   No active issues.  P:   Continue TF. Protonix for SUP.  HEMATOLOGIC A:   Thrombocytopenia, chronic - suspect secondary to alcohol abuse. Mild anemia (Hgb at baseline) H/o prostate Ca s/p TURP  P:  Follow CBC. Heparin SQ for VTE ppx. Follow plat trend and head hematoma.  INFECTIOUS A:   Sepsis secondary to UTI  P:   ABX and cultures as above Was on rocephin for 3 days and zosyn for 4 days, Klebsiella was sensitive to both, total treatment days of 7 days complete.  ENDOCRINE CBG (last 3)   Recent Labs  11/15/15 0321 11/15/15 0812 11/15/15 1136  GLUCAP 138* 176* 102*  A:   Hypotension(resolved) R/o rel AI   P:   Follow glucose on BMP Cortisol low but low bp resolved  NEUROLOGIC A:   Acute metabolic encephalopathy Alcohol withdrawal Parkinsons P:   Fenatynyl IV PRN IV thiamine, folate Carbi/dopa when  able, cant crush CIWA protocol now that patient is extubated. PRN versed.  Will likely be able to extubate Thursday or Friday with no intention to reintubate then make a full DNR.  FAMILY  - Updates: Spoke with brother and niece, LCB and once extubated DNR status, no trach/peg, one way extubation when ready.  - Inter-disciplinary family meet or Palliative Care meeting due by:  11/17/2015  The patient is critically ill with multiple organ systems failure and requires high complexity decision making for assessment and support, frequent evaluation and titration of therapies, application of advanced monitoring technologies and extensive interpretation of multiple databases.   Critical Care Time devoted to patient care services described in this note is  35  Minutes. This time reflects time of care of this signee Dr [name]. This critical care time does not reflect procedure time, or  teaching time or supervisory time of PA/NP/Med student/Med Resident etc but could involve care discussion time.  Rush Farmer, M.D. [facility] Pulmonary/Critical Care Medicine. Pager: [phone]. After hours pager: [phone].

## 2015-11-15 NOTE — Significant Event (Signed)
Wasted approximately 150cc of fentanyl in sink and flushed. Patient not on continuous sedation since this morning. Waste this in witness with RN Balinda Quails.

## 2015-11-15 NOTE — Progress Notes (Signed)
PT Cancellation Note  Patient Details Name: SIRCHARLES SANDVIK MRN: [id number] DOB: 06/29/1929   Cancelled Treatment:    Reason Eval/Treat Not Completed: Medical issues which prohibited therapy.   On vent and not ready to participate. 11/15/2015  Donnella Sham, [place] [phone]  (pager)   Kalii Chesmore, Tessie Fass 11/15/2015, 3:27 PM

## 2015-11-16 ENCOUNTER — Inpatient Hospital Stay (HOSPITAL_COMMUNITY): Payer: Medicare Other

## 2015-11-16 DIAGNOSIS — E440 Moderate protein-calorie malnutrition: Secondary | ICD-10-CM

## 2015-11-16 LAB — BLOOD GAS, ARTERIAL
Acid-Base Excess: 3.9 mmol/L — ABNORMAL HIGH (ref 0.0–2.0)
BICARBONATE: 26.8 meq/L — AB (ref 20.0–24.0)
Drawn by: 441661
FIO2: 0.4
LHR: 14 {breaths}/min
O2 SAT: 99.1 %
PATIENT TEMPERATURE: 98.6
PCO2 ART: 32.4 mmHg — AB (ref 35.0–45.0)
PEEP: 5 cmH2O
PH ART: 7.527 — AB (ref 7.350–7.450)
PO2 ART: 126 mmHg — AB (ref 80.0–100.0)
TCO2: 27.8 mmol/L (ref 0–100)
VT: 580 mL

## 2015-11-16 LAB — CBC
HCT: 33.4 % — ABNORMAL LOW (ref 39.0–52.0)
Hemoglobin: 10.8 g/dL — ABNORMAL LOW (ref 13.0–17.0)
MCH: 32 pg (ref 26.0–34.0)
MCHC: 32.3 g/dL (ref 30.0–36.0)
MCV: 98.8 fL (ref 78.0–100.0)
PLATELETS: 278 10*3/uL (ref 150–400)
RBC: 3.38 MIL/uL — ABNORMAL LOW (ref 4.22–5.81)
RDW: 17.9 % — ABNORMAL HIGH (ref 11.5–15.5)
WBC: 6.5 10*3/uL (ref 4.0–10.5)

## 2015-11-16 LAB — BASIC METABOLIC PANEL
Anion gap: 12 (ref 5–15)
BUN: 22 mg/dL — AB (ref 6–20)
CHLORIDE: 104 mmol/L (ref 101–111)
CO2: 28 mmol/L (ref 22–32)
CREATININE: 1.09 mg/dL (ref 0.61–1.24)
Calcium: 8.9 mg/dL (ref 8.9–10.3)
GFR calc Af Amer: >60 mL/min (ref >60)
GLUCOSE: 143 mg/dL — AB (ref 65–99)
POTASSIUM: 4.5 mmol/L (ref 3.5–5.1)
SODIUM: 144 mmol/L (ref 135–145)

## 2015-11-16 LAB — GLUCOSE, CAPILLARY
GLUCOSE-CAPILLARY: 158 mg/dL — AB (ref 65–99)
GLUCOSE-CAPILLARY: 160 mg/dL — AB (ref 65–99)
Glucose-Capillary: 124 mg/dL — ABNORMAL HIGH (ref 65–99)
Glucose-Capillary: 139 mg/dL — ABNORMAL HIGH (ref 65–99)
Glucose-Capillary: 153 mg/dL — ABNORMAL HIGH (ref 65–99)
Glucose-Capillary: 156 mg/dL — ABNORMAL HIGH (ref 65–99)

## 2015-11-16 LAB — MAGNESIUM: MAGNESIUM: 2.2 mg/dL (ref 1.7–2.4)

## 2015-11-16 LAB — PHOSPHORUS: Phosphorus: 3.8 mg/dL (ref 2.5–4.6)

## 2015-11-16 MED ORDER — CETYLPYRIDINIUM CHLORIDE 0.05 % MT LIQD
7.0000 mL | Freq: Two times a day (BID) | OROMUCOSAL | Status: DC
  Administered 2015-11-16 – 2015-11-20 (×5): 7 mL via OROMUCOSAL

## 2015-11-16 NOTE — Procedures (Signed)
Extubation Procedure Note  Patient Details:   Name: FRAK PAOLINI DOB: 1929/06/15 MRN: [id number]   Airway Documentation:  Airway 7.5 mm (Active)  Secured at (cm) 25 cm 11/16/2015  8:33 AM  Measured From Lips 11/16/2015  8:33 AM  Secured Location Right 11/16/2015  8:33 AM  Secured By [employer] 11/16/2015  8:33 AM  Tube Holder Repositioned Yes 11/16/2015  8:33 AM  Cuff Pressure (cm H2O) 26 cm H2O 11/16/2015  4:30 AM  Site Condition Dry 11/16/2015  8:33 AM    Evaluation  O2 sats: stable throughout Complications: No apparent complications Patient did tolerate procedure well. Bilateral Breath Sounds: Rhonchi, Diminished Suctioning: Oral, Airway Yes   Patient was extubated to a 4L [place]. No stridor noted. Cuff leak heard. RN was at bedside with RT during extubation. Patient was able to speak after extubation.  Tiburcio Bash 11/16/2015, 9:59 AM

## 2015-11-16 NOTE — Progress Notes (Signed)
PULMONARY / CRITICAL CARE MEDICINE   Name: Victor Clarke MRN: [id number] DOB: June 30, 1929    ADMISSION DATE:  11/07/2015 CONSULTATION DATE:  11/07/2015  REFERRING MD:  Dr. Sloan Leiter  CHIEF COMPLAINT:  AMS  HISTORY OF PRESENT ILLNESS:   81 year old male with PMH as below, which includes HTN, HLD, COPD, and Parkinson's. He also has a history of ETOH abuse and has presented to ED several times recently for fall and was found to be intoxicated each time. 12/20 he again presented to [facility] ED s/p falling down steps at home and was found down for unknown time. He was altered and intoxicated in ED. He had lacerations to the front of his scalp and forehead. UA was suspicious for UTI and he was started on ABX. CT head/cspine was obtained and showed no acute intracranial issues. Minimally displaced nasal bone fracture. No C-Spine fracture. He was improved to the hospitalis team and was placed on CIWA protocol. His encephalopathy was much improved the following day 12/21. 12/22 he was becoming more lethargic and was moved to SDU for closer monitoring, however he was still alert and able to follow commands. As the night went on his mental status progressively diminished and his respiratory status decline. PCCM consulted for further evaluation.   SUBJECTIVE:  Arousable but not following commands.  VITAL SIGNS: BP 148/73 mmHg  Pulse 87  Temp(Src) 99.5 F (37.5 C) (Oral)  Resp 21  Ht 5\' 9"  (1.753 m)  Wt 60.4 kg (133 lb 2.5 oz)  BMI 19.66 kg/m2  SpO2 97%  HEMODYNAMICS:    VENTILATOR SETTINGS: Vent Mode:  [-] CPAP;PSV FiO2 (%):  [40 %] 40 % Set Rate:  [14 bmp] 14 bmp Vt Set:  [580 mL] 580 mL PEEP:  [5 cmH20] 5 cmH20 Pressure Support:  [8 cmH20-10 cmH20] 8 cmH20 Plateau Pressure:  [15 cmH20-25 cmH20] 16 cmH20  INTAKE / OUTPUT: I/O last 3 completed shifts: In: 2587.3 [I.V.:425; NG/GT:2137.3; IV Piggyback:25] Out: 5095 [id number]  PHYSICAL EXAMINATION: General:  Elderly, chronically ill  appearing male. Neuro:  Arousable, withdraws to pain but intermittently follows commands. HEENT:  Ecchymosis to forehead and bilateral periorbits.  Cardiovascular:  s1 s2, regular, no MRG Lungs:  Upper airway sounds noted. Abdomen:  Soft, no r/g, non-distended Musculoskeletal:  No acute deformity Skin:  Grossly intact  LABS:  BMET  Recent Labs Lab 11/14/15 0421 11/15/15 0330 11/16/15 0305  NA 143 144 144  K 2.8* 4.3 4.5  CL 110 107 104  CO2 25 27 28   BUN 10 17 22*  CREATININE 0.96 1.03 1.09  GLUCOSE 115* 163* 143*   Electrolytes  Recent Labs Lab 11/14/15 0421 11/15/15 0330 11/16/15 0305  CALCIUM 7.7* 8.7* 8.9  MG 2.2 1.6* 2.2  PHOS 4.2 2.7 3.8   CBC  Recent Labs Lab 11/14/15 0421 11/15/15 0330 11/16/15 0305  WBC 4.2 5.6 6.5  HGB 8.8* 10.1* 10.8*  HCT 26.9* 30.9* 33.4*  PLT 172 231 278   Coag's No results for input(s): APTT, INR in the last 168 hours.  Sepsis Markers  Recent Labs Lab 11/10/15 0111 11/10/15 1328 11/10/15 1930  LATICACIDVEN 2.2* 1.0 1.3   ABG  Recent Labs Lab 11/14/15 0435 11/15/15 0348 11/16/15 0440  PHART 7.438 7.496* 7.527*  PCO2ART 33.9* 34.2* 32.4*  PO2ART 105* 88.8 126*   Liver Enzymes  Recent Labs Lab 11/09/15 2200 11/10/15 0111 11/11/15 0600  AST 42* 40  --   ALT 13* 16*  --   ALKPHOS 48 47  --  BILITOT 1.1 QUANTITY NOT SUFFICIENT, UNABLE TO PERFORM TEST  --   ALBUMIN 3.5 3.3* 2.2*   Cardiac Enzymes No results for input(s): TROPONINI, PROBNP in the last 168 hours.  Glucose  Recent Labs Lab 11/15/15 1136 11/15/15 1528 11/15/15 2011 11/15/15 2335 11/16/15 0343 11/16/15 0804  GLUCAP 102* 127* 141* 156* 153* 158*   Imaging Dg Chest Port 1 View  11/16/2015  CLINICAL DATA:  Intubated EXAM: PORTABLE CHEST 1 VIEW COMPARISON:  11/15/2015 FINDINGS: Endotracheal tube is 4.4 cm above the carina. Right central line is unchanged. Heart is borderline in size. Improving patchy bilateral airspace opacities.  No visible effusions. IMPRESSION: Improving bilateral airspace disease. Electronically Signed   By: Rolm Baptise M.D.   On: 11/16/2015 08:12   STUDIES:  CT head, C-spine 12/20 > Advanced atrophy and microvascular ischemic disease without acute intracranial process. Suspected minimally displaced nasal bone fracture. No additional facial fractures identified on this motion degraded examination. Motion degraded examination without evidence of fracture or static subluxation of the cervical spine. Moderate to severe multilevel cervical spine DDD. CT head 12/23 > No acute intracranial finding. Atrophy and chronic small vessel disease. Forehead contusion and known nasal arch fracture.  CULTURES: Blood 12/20 > Urine 12/20 > GNR >>> Tracheal aspirate 12/23 >>>  ANTIBIOTICS: Zosyn 12/20 >>>12/26  SIGNIFICANT EVENTS: 12/20 fall, admit 12/22 tx to SDU for increasing lethargy 12/23 worsening MS, tx to ICU for intubation 12/26 extubated then promptly reintubated for AMS and unable to protect airway.  LINES/TUBES: ETT 12/23 >>>12/24>>>12/24>>>12/26>>>12/29  ASSESSMENT / PLAN:  PULMONARY A: Acute hypoxemic respiratory failure COPD without evidence of acute exacerbation ILD on CT in past R/o aspiration / edema P:   Extubate today and make full DNR per brother's request, patient too confused to discuss code status with. BDers PRN sedation. IS per RT protocol Titrate O2 for sats. Ambulate.  CARDIOVASCULAR A:  H/o HTN, HLD  P:  Telemetry. Holding home antihypertensives, may need to resume soon. KVO IVF.  RENAL A:   High AG metabolic acidosis- lactic(resolved)Small NONAG Hypomagnesemia repleted  P:   Replace electrolytes as indicated. Follow BMP. KVO IVF. Hold further lasix.  GASTROINTESTINAL A:   No active issues.  P:   Speech pathology to evaluate. Protonix for SUP.  HEMATOLOGIC A:   Thrombocytopenia, chronic - suspect secondary to alcohol abuse. Mild anemia (Hgb at  baseline) H/o prostate Ca s/p TURP  P:  Follow CBC. Heparin SQ for VTE ppx. Follow plat trend and head hematoma.  INFECTIOUS A:   Sepsis secondary to UTI  P:   ABX and cultures as above Was on rocephin for 3 days and zosyn for 4 days, Klebsiella was sensitive to both, total treatment days of 7 days complete.  ENDOCRINE CBG (last 3)   Recent Labs  11/15/15 2335 11/16/15 0343 11/16/15 0804  GLUCAP 156* 153* 158*  A:   Hypotension(resolved) R/o rel AI   P:   Follow glucose on BMP Cortisol low but low bp resolved  NEUROLOGIC A:   Acute metabolic encephalopathy Alcohol withdrawal Parkinsons P:   Haldol for agitation. IV thiamine, folate. Carbi/dopa if passes SLP  Full DNR post extubation.  FAMILY  - Updates: Spoke with brother and niece, LCB and once extubated DNR status, no trach/peg, one way extubation when ready.  - Inter-disciplinary family meet or Palliative Care meeting due by:  11/17/2015  The patient is critically ill with multiple organ systems failure and requires high complexity decision making for assessment and support, frequent  evaluation and titration of therapies, application of advanced monitoring technologies and extensive interpretation of multiple databases.   Critical Care Time devoted to patient care services described in this note is  35  Minutes. This time reflects time of care of this signee Dr [name]. This critical care time does not reflect procedure time, or teaching time or supervisory time of PA/NP/Med student/Med Resident etc but could involve care discussion time.  Rush Farmer, M.D. [facility] Pulmonary/Critical Care Medicine. Pager: [phone]. After hours pager: [phone].

## 2015-11-16 NOTE — Clinical Social Work Note (Signed)
CSW consulted from ETOH use to assess and provide resources. CSW notes that patient is still intubated. CSW will attempt to reassess the patient when appropriate.   Liz Beach MSW, [place], [place], [id number]

## 2015-11-16 NOTE — Progress Notes (Addendum)
Physical Therapy Treatment Patient Details Name: Victor Clarke MRN: [id number] DOB: September 16, 1929 Today's Date: 11/16/2015    History of Present Illness 81 yo male with fall prior to admission, has EtOH history and Parkinson's, noted to have acute encephalopathy.  New nose fracture from the fall with substantial facial lacerations.  Tx to ICU due to AMS for intubation.  Extubated 12/24, Reintubated 12/26/  and Extubated 2nd time 12/29.    PT Comments    Pt has declined functionally after initial evaluation, before transfer to the ICU for intubation.  Pt currently limited functionally due to the problems listed. ( See problems list.)   Pt will benefit from PT to maximize function and safety in order to get ready for next venue listed below.   Follow Up Recommendations  SNF     Equipment Recommendations  None recommended by PT    Recommendations for Other Services       Precautions / Restrictions Precautions Precautions: Fall    Mobility  Bed Mobility Overal bed mobility: Needs Assistance Bed Mobility: Supine to Sit     Supine to sit: Max assist     General bed mobility comments: assisted pt to EOB, pt rocking, but not getting any forward progress.  Transfers Overall transfer level: Needs assistance Equipment used: 2 person hand held assist;1 person hand held assist   Sit to Stand: Max assist;+2 safety/equipment Stand pivot transfers: Max assist;+2 physical assistance          Ambulation/Gait             General Gait Details: not tested   Stairs            Wheelchair Mobility    Modified Rankin (Stroke Patients Only)       Balance Overall balance assessment: Needs assistance Sitting-balance support: Single extremity supported Sitting balance-Leahy Scale: Poor Sitting balance - Comments: tends to fall back     Standing balance-Leahy Scale: Zero Standing balance comment: rigid posturing. Reliance on assist                     Cognition Arousal/Alertness: Awake/alert Behavior During Therapy: Restless Overall Cognitive Status: Impaired/Different from baseline Area of Impairment: Orientation;Attention;Following commands;Safety/judgement;Awareness;Problem solving Orientation Level: Time;Situation;Place Current Attention Level: Focused   Following Commands: Follows one step commands inconsistently Safety/Judgement: Decreased awareness of safety;Decreased awareness of deficits Awareness: Intellectual        Exercises      General Comments        Pertinent Vitals/Pain Pain Assessment: Faces Faces Pain Scale: No hurt    Home Living                      Prior Function            PT Goals (current goals can now be found in the care plan section) Acute Rehab PT Goals Patient Stated Goal: to get to drive again PT Goal Formulation: With patient Time For Goal Achievement: 11/30/15 Potential to Achieve Goals: Fair    Frequency  Min 2X/week    PT Plan Current plan remains appropriate    Co-evaluation             End of Session Equipment Utilized During Treatment: Oxygen Activity Tolerance: Treatment limited secondary to agitation;Patient limited by fatigue Patient left: in chair;with call bell/phone within reach;with chair alarm set;Other (comment) (mittens)     Time: [id number] PT Time Calculation (min) (ACUTE ONLY): 33 min  Charges:  $Therapeutic Activity:  8-22 mins                    G Codes:      Tayton Decaire, Tessie Fass 11/16/2015, 2:59 PM 11/16/2015  Donnella Sham, [place] [phone]  (pager)

## 2015-11-16 NOTE — Progress Notes (Signed)
abg collected  

## 2015-11-17 ENCOUNTER — Inpatient Hospital Stay (HOSPITAL_COMMUNITY): Payer: Medicare Other

## 2015-11-17 DIAGNOSIS — J9601 Acute respiratory failure with hypoxia: Secondary | ICD-10-CM

## 2015-11-17 LAB — GLUCOSE, CAPILLARY
GLUCOSE-CAPILLARY: 105 mg/dL — AB (ref 65–99)
GLUCOSE-CAPILLARY: 110 mg/dL — AB (ref 65–99)
GLUCOSE-CAPILLARY: 112 mg/dL — AB (ref 65–99)
Glucose-Capillary: 114 mg/dL — ABNORMAL HIGH (ref 65–99)
Glucose-Capillary: 108 mg/dL — ABNORMAL HIGH (ref 65–99)
Glucose-Capillary: 121 mg/dL — ABNORMAL HIGH (ref 65–99)

## 2015-11-17 LAB — POCT I-STAT 3, ART BLOOD GAS (G3+)
ACID-BASE EXCESS: 5 mmol/L — AB (ref 0.0–2.0)
Bicarbonate: 26.9 mEq/L — ABNORMAL HIGH (ref 20.0–24.0)
O2 Saturation: 94 %
PO2 ART: 62 mmHg — AB (ref 80.0–100.0)
TCO2: 28 mmol/L (ref 0–100)
pCO2 arterial: 32.2 mmHg — ABNORMAL LOW (ref 35.0–45.0)
pH, Arterial: 7.529 — ABNORMAL HIGH (ref 7.350–7.450)

## 2015-11-17 LAB — BASIC METABOLIC PANEL
ANION GAP: 10 (ref 5–15)
BUN: 27 mg/dL — AB (ref 6–20)
CALCIUM: 8.9 mg/dL (ref 8.9–10.3)
CO2: 29 mmol/L (ref 22–32)
Chloride: 105 mmol/L (ref 101–111)
Creatinine, Ser: 0.95 mg/dL (ref 0.61–1.24)
GFR calc Af Amer: >60 mL/min (ref >60)
GLUCOSE: 140 mg/dL — AB (ref 65–99)
POTASSIUM: 3.9 mmol/L (ref 3.5–5.1)
SODIUM: 144 mmol/L (ref 135–145)

## 2015-11-17 LAB — CBC
HCT: 33.1 % — ABNORMAL LOW (ref 39.0–52.0)
Hemoglobin: 10.7 g/dL — ABNORMAL LOW (ref 13.0–17.0)
MCH: 31.9 pg (ref 26.0–34.0)
MCHC: 32.3 g/dL (ref 30.0–36.0)
MCV: 98.8 fL (ref 78.0–100.0)
PLATELETS: 291 10*3/uL (ref 150–400)
RBC: 3.35 MIL/uL — AB (ref 4.22–5.81)
RDW: 17.6 % — AB (ref 11.5–15.5)
WBC: 7.2 10*3/uL (ref 4.0–10.5)

## 2015-11-17 LAB — PHOSPHORUS: Phosphorus: 4 mg/dL (ref 2.5–4.6)

## 2015-11-17 LAB — MAGNESIUM: MAGNESIUM: 2 mg/dL (ref 1.7–2.4)

## 2015-11-17 MED ORDER — CARBIDOPA-LEVODOPA 25-100 MG PO TABS
0.5000 | ORAL_TABLET | Freq: Three times a day (TID) | ORAL | Status: DC
Start: 2015-11-17 — End: 2015-11-20
  Administered 2015-11-17 – 2015-11-20 (×9): 0.5 via ORAL
  Filled 2015-11-17 (×2): qty 1
  Filled 2015-11-17: qty 0.5
  Filled 2015-11-17 (×3): qty 1
  Filled 2015-11-17: qty 0.5
  Filled 2015-11-17 (×3): qty 1

## 2015-11-17 MED ORDER — ALLOPURINOL 100 MG PO TABS
100.0000 mg | ORAL_TABLET | Freq: Every day | ORAL | Status: DC
  Administered 2015-11-17 – 2015-11-20 (×4): 100 mg via ORAL
  Filled 2015-11-17 (×5): qty 1

## 2015-11-17 MED ORDER — ADULT MULTIVITAMIN W/MINERALS CH
1.0000 | ORAL_TABLET | Freq: Every day | ORAL | Status: DC
  Administered 2015-11-17 – 2015-11-20 (×4): 1 via ORAL
  Filled 2015-11-17 (×4): qty 1

## 2015-11-17 MED ORDER — ASPIRIN 81 MG PO CHEW
81.0000 mg | CHEWABLE_TABLET | Freq: Every day | ORAL | Status: DC
Start: 2015-11-18 — End: 2015-11-20
  Administered 2015-11-18 – 2015-11-20 (×3): 81 mg via ORAL
  Filled 2015-11-17 (×3): qty 1

## 2015-11-17 MED ORDER — ATORVASTATIN CALCIUM 40 MG PO TABS
40.0000 mg | ORAL_TABLET | Freq: Every day | ORAL | Status: DC
  Administered 2015-11-18 – 2015-11-20 (×3): 40 mg via ORAL
  Filled 2015-11-17 (×3): qty 1

## 2015-11-17 MED ORDER — DILTIAZEM HCL ER COATED BEADS 120 MG PO CP24
120.0000 mg | ORAL_CAPSULE | Freq: Every day | ORAL | Status: DC
  Administered 2015-11-17 – 2015-11-20 (×4): 120 mg via ORAL
  Filled 2015-11-17 (×4): qty 1

## 2015-11-17 NOTE — Evaluation (Signed)
Clinical/Bedside Swallow Evaluation Patient Details  Name: Victor Clarke MRN: [id number] Date of Birth: August 31, 1929  Today's Date: 11/17/2015 Time: SLP Start Time (ACUTE ONLY): 0940 SLP Stop Time (ACUTE ONLY): 0955 SLP Time Calculation (min) (ACUTE ONLY): 15 min  Past Medical History:  Past Medical History  Diagnosis Date  . Hypertension   . Hypercholesteremia   . Gout   . COPD (chronic obstructive pulmonary disease) ([place])   . Detached retina     a. s/p surgical correction on the right.  . Prostate cancer ([place])     a. s/p TURP.  Marland Kitchen Ectopic atrial tachycardia ([place])     a. 08/2014 Echo: EF 55-60%, mildly dil LA.  . Parkinson's disease ([place])   . Left inguinal hernia     a. s/p repair ~ 10 yrs ago.   Past Surgical History:  Past Surgical History  Procedure Laterality Date  . Colonoscopy w/ polypectomy  2001, 2006  . Hernia repair    . Prostate surgery     HPI:  81 year old male with PMH of HTN, COPD, Parkinson's, ETOH admitted 12/20 with fall down steps, AMS, intoxication, scalp and forehead lacerations. UA was suspicious for UTI. Declined 12/23 requiring intubation. Extubated 12/24. Seen by SLP found to have acute reversible dysphagia, but then reintubated until 12/29. SLP reordered.    Assessment / Plan / Recommendation Clinical Impression  Pt demonstrates improved function since last SLP assessment prior to reintubation. Vocal quality clearer, timing of swallow also improved. Pt demonstrates throat clearing after the swallow and RN reports immediate coughing. Concerned for impaired sensation of aspiration given prolonged intubation. Recommend MBS for objective assessment of swallowing as pt is otherwise alert and ready to eat.     Aspiration Risk  Severe aspiration risk    Diet Recommendation NPO   Medication Administration: Crushed with puree    Other  Recommendations     Follow up Recommendations  Skilled Nursing facility    Frequency and Duration             Prognosis        Swallow Study   General HPI: 81 year old male with PMH of HTN, COPD, Parkinson's, ETOH admitted 12/20 with fall down steps, AMS, intoxication, scalp and forehead lacerations. UA was suspicious for UTI. Declined 12/23 requiring intubation. Extubated 12/24. Seen by SLP found to have acute reversible dysphagia, but then reintubated until 12/29. SLP reordered.  Type of Study: Bedside Swallow Evaluation Previous Swallow Assessment: see HPI Diet Prior to this Study: NPO Temperature Spikes Noted: No Respiratory Status: Nasal cannula History of Recent Intubation: Yes Length of Intubations (days): 7 days Date extubated: 11/16/15 Behavior/Cognition: Alert;Cooperative;Confused Oral Cavity Assessment: Within Functional Limits Oral Care Completed by SLP: Recent completion by staff Oral Cavity - Dentition: Adequate natural dentition Vision: Functional for self-feeding Self-Feeding Abilities: Able to feed self;Needs assist Patient Positioning: Upright in bed Baseline Vocal Quality:  (very slightly hoarse) Volitional Cough: Strong Volitional Swallow: Able to elicit    Oral/Motor/Sensory Function Overall Oral Motor/Sensory Function: Within functional limits   Ice Chips Ice chips: Impaired Presentation: Spoon Pharyngeal Phase Impairments: Throat Clearing - Immediate   Thin Liquid Thin Liquid: Impaired Presentation: Cup;Straw Pharyngeal  Phase Impairments: Throat Clearing - Immediate    Nectar Thick Nectar Thick Liquid: Not tested   Honey Thick Honey Thick Liquid: Not tested   Puree Puree: Within functional limits   Solid   GO    Solid: Within functional limits  Herbie Baltimore, [place] CCC-SLP [id number]  [place] Doster, Katherene Ponto 11/17/2015,10:21 AM

## 2015-11-17 NOTE — Progress Notes (Signed)
MBSS complete. Full report located under chart review in imaging section. Larayne Baxley, MA CCC-SLP [phone]  

## 2015-11-17 NOTE — Progress Notes (Signed)
MD paged about allowing patient to travel without RN to MBS. VSS. MD and RN agreed that pt could travel without RN to procedure. Will continue to monitor pt closely.

## 2015-11-17 NOTE — Progress Notes (Signed)
PULMONARY / CRITICAL CARE MEDICINE   Name: Victor Clarke MRN: [id number] DOB: 06-09-1929    ADMISSION DATE:  11/07/2015 CONSULTATION DATE:  11/07/2015  REFERRING MD:  Dr. Sloan Leiter  CHIEF COMPLAINT:  AMS  HISTORY OF PRESENT ILLNESS:   81 year old male with PMH as below, which includes HTN, HLD, COPD, and Parkinson's. He also has a history of ETOH abuse and has presented to ED several times recently for fall and was found to be intoxicated each time. 12/20 he again presented to [facility] ED s/p falling down steps at home and was found down for unknown time. He was altered and intoxicated in ED. He had lacerations to the front of his scalp and forehead. UA was suspicious for UTI and he was started on ABX. CT head/cspine was obtained and showed no acute intracranial issues. Minimally displaced nasal bone fracture. No C-Spine fracture. He was improved to the hospitalis team and was placed on CIWA protocol. His encephalopathy was much improved the following day 12/21. 12/22 he was becoming more lethargic and was moved to SDU for closer monitoring, however he was still alert and able to follow commands. As the night went on his mental status progressively diminished and his respiratory status decline. PCCM consulted for further evaluation.   SUBJECTIVE:  Awake and appropriate.  VITAL SIGNS: BP 156/83 mmHg  Pulse 72  Temp(Src) 99.1 F (37.3 C) (Oral)  Resp 26  Ht 5\' 9"  (1.753 m)  Wt 61.1 kg (134 lb 11.2 oz)  BMI 19.88 kg/m2  SpO2 99%  HEMODYNAMICS:    VENTILATOR SETTINGS:    INTAKE / OUTPUT: I/O last 3 completed shifts: In: [id number] [P.O.:110; I.V.:180; NG/GT:930] Out: 2220 [Urine:2220]  PHYSICAL EXAMINATION: General:  Elderly, chronically ill appearing male. Neuro:  Arousable, moving ext to command. HEENT:  Ecchymosis to forehead and bilateral periorbits.  Cardiovascular:  s1 s2, regular, no MRG Lungs:  Upper airway sounds noted. Abdomen:  Soft, no r/g, non-distended Musculoskeletal:  No  acute deformity Skin:  Grossly intact  LABS:  BMET  Recent Labs Lab 11/15/15 0330 11/16/15 0305 11/17/15 0243  NA 144 144 144  K 4.3 4.5 3.9  CL 107 104 105  CO2 27 28 29   BUN 17 22* 27*  CREATININE 1.03 1.09 0.95  GLUCOSE 163* 143* 140*   Electrolytes  Recent Labs Lab 11/15/15 0330 11/16/15 0305 11/17/15 0243  CALCIUM 8.7* 8.9 8.9  MG 1.6* 2.2 2.0  PHOS 2.7 3.8 4.0   CBC  Recent Labs Lab 11/15/15 0330 11/16/15 0305 11/17/15 0243  WBC 5.6 6.5 7.2  HGB 10.1* 10.8* 10.7*  HCT 30.9* 33.4* 33.1*  PLT 231 278 291   Coag's No results for input(s): APTT, INR in the last 168 hours.  Sepsis Markers  Recent Labs Lab 11/10/15 1328 11/10/15 1930  LATICACIDVEN 1.0 1.3   ABG  Recent Labs Lab 11/15/15 0348 11/16/15 0440 11/17/15 0906  PHART 7.496* 7.527* 7.529*  PCO2ART 34.2* 32.4* 32.2*  PO2ART 88.8 126* 62.0*   Liver Enzymes  Recent Labs Lab 11/11/15 0600  ALBUMIN 2.2*   Cardiac Enzymes No results for input(s): TROPONINI, PROBNP in the last 168 hours.  Glucose  Recent Labs Lab 11/16/15 1142 11/16/15 1526 11/16/15 2003 11/16/15 2339 11/17/15 0407 11/17/15 0810  GLUCAP 160* 124* 139* 105* 112* 110*   Imaging Dg Swallowing Func-speech Pathology  11/17/2015  Objective Swallowing Evaluation:   Patient Details Name: Victor Clarke MRN: [id number] Date of Birth: 1929-07-06 Today's Date: 11/17/2015 Time: SLP Start  Time (ACUTE ONLY): 1117-SLP Stop Time (ACUTE ONLY): 1145 SLP Time Calculation (min) (ACUTE ONLY): 28 min Past Medical History: @PMH @ Past Surgical History: Past Surgical History Procedure Laterality Date . Colonoscopy w/ polypectomy  2001, 2006 . Hernia repair   . Prostate surgery   HPI: 81 year old male with PMH of HTN, COPD, Parkinson's, ETOH admitted 12/20 with fall down steps, AMS, intoxication, scalp and forehead lacerations. UA was suspicious for UTI. Declined 12/23 requiring intubation. Extubated 12/24. Seen by SLP found to have  acute reversible dysphagia, but then reintubated until 12/29. SLP reordered.  No Data Recorded Assessment / Plan / Recommendation CHL IP CLINICAL IMPRESSIONS 11/17/2015 Therapy Diagnosis Mild oral phase dysphagia;Mild pharyngeal phase dysphagia Clinical Impression Pt demonstrates mild oral dysphagia with brief oral holding/pumping prior to initiating the swallow, though there is no premature spill; bolus remains contained. Oropharyngeal function is characterized by mild base of tongue weakness resulting in residuals that penetrate the airway post swallow with nectar and thin liquids. Pt clears throat either immediately or a few swallows later, clearing trace penetrate. No aspiration observed. Pt may initiate a regular diet and thin liquids with basic aspiration precautions. Pt should be encouraged to clear his throat. SLP will f/u for tolerance.  Impact on safety and function Mild aspiration risk   CHL IP TREATMENT RECOMMENDATION 11/17/2015 Treatment Recommendations Therapy as outlined in treatment plan below   Prognosis 11/17/2015 Prognosis for Safe Diet Advancement Good Barriers to Reach Goals Cognitive deficits Barriers/Prognosis Comment -- CHL IP DIET RECOMMENDATION 11/17/2015 SLP Diet Recommendations Regular solids;Thin liquid Liquid Administration via Cup;Straw Medication Administration Whole meds with liquid Compensations Minimize environmental distractions;Slow rate;Small sips/bites Postural Changes Remain semi-upright after after feeds/meals (Comment)   CHL IP OTHER RECOMMENDATIONS 11/17/2015 Recommended Consults -- Oral Care Recommendations Oral care BID Other Recommendations --   CHL IP FOLLOW UP RECOMMENDATIONS 11/17/2015 Follow up Recommendations Skilled Nursing facility   [facility] IP FREQUENCY AND DURATION 11/17/2015 Speech Therapy Frequency (ACUTE ONLY) min 2x/week Treatment Duration 1 week      CHL IP ORAL PHASE 11/17/2015 Oral Phase Impaired Oral - Pudding Teaspoon -- Oral - Pudding Cup -- Oral - Honey  Teaspoon -- Oral - Honey Cup -- Oral - Nectar Teaspoon -- Oral - Nectar Cup Delayed oral transit;Lingual pumping;Holding of bolus Oral - Nectar Straw -- Oral - Thin Teaspoon -- Oral - Thin Cup Delayed oral transit;Lingual pumping;Holding of bolus Oral - Thin Straw Delayed oral transit;Lingual pumping;Holding of bolus Oral - Puree Delayed oral transit;Lingual pumping;Holding of bolus Oral - Mech Soft -- Oral - Regular Delayed oral transit;Lingual pumping;Holding of bolus Oral - Multi-Consistency -- Oral - Pill Delayed oral transit;Lingual pumping;Holding of bolus Oral Phase - Comment --  CHL IP PHARYNGEAL PHASE 11/17/2015 Pharyngeal Phase Impaired Pharyngeal- Pudding Teaspoon -- Pharyngeal -- Pharyngeal- Pudding Cup -- Pharyngeal -- Pharyngeal- Honey Teaspoon -- Pharyngeal -- Pharyngeal- Honey Cup -- Pharyngeal -- Pharyngeal- Nectar Teaspoon -- Pharyngeal -- Pharyngeal- Nectar Cup Pharyngeal residue - valleculae;Penetration/Apiration after swallow;Reduced tongue base retraction Pharyngeal Material enters airway, remains ABOVE vocal cords and not ejected out Pharyngeal- Nectar Straw -- Pharyngeal -- Pharyngeal- Thin Teaspoon -- Pharyngeal -- Pharyngeal- Thin Cup Pharyngeal residue - valleculae;Penetration/Apiration after swallow;Reduced tongue base retraction Pharyngeal Material enters airway, CONTACTS cords and then ejected out Pharyngeal- Thin Straw Pharyngeal residue - valleculae;Penetration/Apiration after swallow;Reduced tongue base retraction Pharyngeal Material enters airway, CONTACTS cords and then ejected out Pharyngeal- Puree Reduced epiglottic inversion;Reduced tongue base retraction Pharyngeal -- Pharyngeal- Mechanical Soft -- Pharyngeal -- Pharyngeal-  Regular Reduced epiglottic inversion;Reduced tongue base retraction Pharyngeal -- Pharyngeal- Multi-consistency -- Pharyngeal -- Pharyngeal- Pill -- Pharyngeal -- Pharyngeal Comment --  No flowsheet data found. Herbie Baltimore, [place] CCC-SLP [phone] Othelia Pulling  Katherene Ponto 11/17/2015, 12:37 PM              STUDIES:  CT head, C-spine 12/20 > Advanced atrophy and microvascular ischemic disease without acute intracranial process. Suspected minimally displaced nasal bone fracture. No additional facial fractures identified on this motion degraded examination. Motion degraded examination without evidence of fracture or static subluxation of the cervical spine. Moderate to severe multilevel cervical spine DDD. CT head 12/23 > No acute intracranial finding. Atrophy and chronic small vessel disease. Forehead contusion and known nasal arch fracture.  CULTURES: Blood 12/20 > Urine 12/20 > GNR >>> Tracheal aspirate 12/23 >>>  ANTIBIOTICS: Zosyn 12/20 >>>12/26  SIGNIFICANT EVENTS: 12/20 fall, admit 12/22 tx to SDU for increasing lethargy 12/23 worsening MS, tx to ICU for intubation 12/26 extubated then promptly reintubated for AMS and unable to protect airway.  LINES/TUBES: ETT 12/23 >>>12/24>>>12/24>>>12/26>>>12/29  I reviewed CXR myself, pleural effusion noted.  ASSESSMENT / PLAN:  PULMONARY A: Acute hypoxemic respiratory failure COPD without evidence of acute exacerbation ILD on CT in past R/o aspiration / edema P:   BDers PRN sedation. IS per RT protocol Titrate O2 for sats. Ambulate. DNR  CARDIOVASCULAR A:  H/o HTN, HLD  P:  Telemetry. Holding home antihypertensives, may need to resume soon. KVO IVF.  RENAL A:   High AG metabolic acidosis- lactic(resolved)Small NONAG Hypomagnesemia repleted  P:   Replace electrolytes as indicated. Follow BMP. KVO IVF. Hold further lasix.   GASTROINTESTINAL A:   No active issues.  P:   Speech pathology passed nectar thick. Protonix for SUP.  HEMATOLOGIC A:   Thrombocytopenia, chronic - suspect secondary to alcohol abuse. Mild anemia (Hgb at baseline) H/o prostate Ca s/p TURP  P:  Follow CBC. Heparin SQ for VTE ppx. Follow plat trend and head  hematoma.  INFECTIOUS A:   Sepsis secondary to UTI  P:   ABX and cultures as above Was on rocephin for 3 days and zosyn for 4 days, Klebsiella was sensitive to both, total treatment days of 7 days complete.  ENDOCRINE CBG (last 3)   Recent Labs  11/16/15 2339 11/17/15 0407 11/17/15 0810  GLUCAP 105* 112* 110*  A:   Hypotension(resolved) R/o rel AI   P:   Follow glucose on BMP Cortisol low but low bp resolved  NEUROLOGIC A:   Acute metabolic encephalopathy Alcohol withdrawal Parkinsons P:   Haldol for agitation. Thiamine, folate oral. Carbi/dopa to oral.  Discussed with TRH-MD.  Full DNR, transfer to med-surg and to [facility] service with PCCM off 12/31.  Rush Farmer, M.D. [facility] Pulmonary/Critical Care Medicine. Pager: [phone]. After hours pager: [phone].

## 2015-11-17 NOTE — Progress Notes (Signed)
Patient trasfered from 31M to 5W21 via wheelchair; alert and oriented x 2; no complaints of pain; IV saline locked in LFA; skin checked by RN floor and charge nurse - bruising on his face and arms; scab on forehead; laceration on left knee and right elbow and scratches on his back.Orient patient to room and unit; instructed how to use the call bell and  fall risk precautions. Will continue to monitor the patient.

## 2015-11-18 LAB — BASIC METABOLIC PANEL WITH GFR
Anion gap: 11 (ref 5–15)
BUN: 21 mg/dL — ABNORMAL HIGH (ref 6–20)
CO2: 27 mmol/L (ref 22–32)
Calcium: 9.3 mg/dL (ref 8.9–10.3)
Chloride: 106 mmol/L (ref 101–111)
Creatinine, Ser: 0.88 mg/dL (ref 0.61–1.24)
GFR calc Af Amer: >60 mL/min (ref >60)
GFR calc non Af Amer: >60 mL/min (ref >60)
Glucose, Bld: 130 mg/dL — ABNORMAL HIGH (ref 65–99)
Potassium: 3.8 mmol/L (ref 3.5–5.1)
Sodium: 144 mmol/L (ref 135–145)

## 2015-11-18 LAB — GLUCOSE, CAPILLARY
GLUCOSE-CAPILLARY: 139 mg/dL — AB (ref 65–99)
Glucose-Capillary: 120 mg/dL — ABNORMAL HIGH (ref 65–99)
Glucose-Capillary: 123 mg/dL — ABNORMAL HIGH (ref 65–99)
Glucose-Capillary: 118 mg/dL — ABNORMAL HIGH (ref 65–99)
Glucose-Capillary: 133 mg/dL — ABNORMAL HIGH (ref 65–99)

## 2015-11-18 LAB — CBC
HEMATOCRIT: 34.2 % — AB (ref 39.0–52.0)
Hemoglobin: 11.6 g/dL — ABNORMAL LOW (ref 13.0–17.0)
MCH: 32.5 pg (ref 26.0–34.0)
MCHC: 33.9 g/dL (ref 30.0–36.0)
MCV: 95.8 fL (ref 78.0–100.0)
PLATELETS: 376 10*3/uL (ref 150–400)
RBC: 3.57 MIL/uL — ABNORMAL LOW (ref 4.22–5.81)
RDW: 17.1 % — AB (ref 11.5–15.5)
WBC: 5.6 10*3/uL (ref 4.0–10.5)

## 2015-11-18 LAB — PHOSPHORUS: Phosphorus: 3.8 mg/dL (ref 2.5–4.6)

## 2015-11-18 LAB — MAGNESIUM: Magnesium: 2 mg/dL (ref 1.7–2.4)

## 2015-11-18 MED ORDER — LORAZEPAM 2 MG/ML IJ SOLN
0.5000 mg | Freq: Once | INTRAMUSCULAR | Status: AC
  Administered 2015-11-19: 0.5 mg via INTRAVENOUS
  Filled 2015-11-18: qty 1

## 2015-11-18 NOTE — Clinical Social Work Note (Signed)
Clinical Social Work Assessment  Patient Details  Name: Victor Clarke MRN: [id number] Date of Birth: 22-Sep-1929  Date of referral:  11/18/15               Reason for consult:  Discharge Planning, Facility Placement                Permission sought to share information with:  Facility Sport and exercise psychologist, Family Supports Permission granted to share information::  Yes, Verbal Permission Granted  Name::     Medical laboratory scientific officer::  SNFs  Relationship::     Contact Information:     Housing/Transportation Living arrangements for the past 2 months:  Single Family Home Source of Information:  Other (Comment Required) (Patient's niece) Patient Interpreter Needed:  None Criminal Activity/Legal Involvement Pertinent to Current Situation/Hospitalization:  No - Comment as needed Significant Relationships:  Other Family Members, Siblings Lives with:  Self Do you feel safe going back to the place where you live?  Yes Need for family participation in patient care:  Yes (Comment)  Care giving concerns:  Patient's family does not feel that the patient can return home and agree with recommendation for SNF at discharge.   Social Worker assessment / plan:  CSW spoke with patient's niece Adela Lank as the patient is not fully oriented. Adela Lank states that the patient was admitted from home where he lives alone. Prior to be at home the patient was at [facility] SNF for rehab. Adela Lank states that she and the patient's brother believe the patient will need to go to SNF at discharge. The family has been making arrangements for the patient to go to an ALF in [place]. They plan to transition the patient once he is well enough so that he will be closer to family. CSW explained SNF search/placement process and answered the family's questions. CSW will followup with bed offers.   Employment status:  Retired Forensic scientist:  Information systems manager, Other (Comment Required) Sports administrator) PT Recommendations:  [place] / Referral to community resources:  [place] (Unable to assess ETOH use at this time given patient's continued confusion.)  Patient/Family's Response to care:  Patient's family state they are happy with the care the patient has received.  Patient/Family's Understanding of and Emotional Response to Diagnosis, Current Treatment, and Prognosis:  The patient's family appears to have a good understanding of the reason for the patient's admission and diagnosis. They understand what the patient's post DC needs will be.   Emotional Assessment Appearance:  Appears stated age Attitude/Demeanor/Rapport:  Unable to Assess Affect (typically observed):  Unable to Assess Orientation:  Oriented to Self, Oriented to Place Alcohol / Substance use:  Alcohol Use Psych involvement (Current and /or in the community):  No (Comment)  Discharge Needs  Concerns to be addressed:  Discharge Planning Concerns Readmission within the last 30 days:  No Current discharge risk:  Cognitively Impaired, Chronically ill, Physical Impairment Barriers to Discharge:  Continued Medical Work up   [employer] MSW, [place], [place], [phone]

## 2015-11-18 NOTE — Progress Notes (Signed)
RN removed foley catheter. Pt tolerated procedure well. No complaints of pain or discomfort voiced when asked. Pt educated to call RN or CNA tech when needing assistance urinating in urinal. Pt verbalized understanding and answered RN teach back questions appropriately. Pt is sitiing up in bed. Bed alarm on. Bed low and locked. Call bell and telephone within reach. Will continue to monitor pt.

## 2015-11-18 NOTE — Clinical Social Work Placement (Signed)
   CLINICAL SOCIAL WORK PLACEMENT  NOTE  Date:  11/18/2015  Patient Details  Name: ROXY KEMME MRN: [id number] Date of Birth: 28-Jan-1929  Clinical Social Work is seeking post-discharge placement for this patient at the [place] level of care (*CSW will initial, date and re-position this form in  chart as items are completed):  Yes   Patient/family provided with [facility] Work Department's list of facilities offering this level of care within the geographic area requested by the patient (or if unable, by the patient's family).  Yes   Patient/family informed of their freedom to choose among providers that offer the needed level of care, that participate in Medicare, Medicaid or managed care program needed by the patient, have an available bed and are willing to accept the patient.  Yes   Patient/family informed of [facility]'s ownership interest in [facility] and [facility], as well as of the fact that they are under no obligation to receive care at these facilities.  PASRR submitted to EDS on       PASRR number received on       Existing PASRR number confirmed on 11/18/15     FL2 transmitted to all facilities in geographic area requested by pt/family on 11/18/15     FL2 transmitted to all facilities within larger geographic area on       Patient informed that his/her managed care company has contracts with or will negotiate with certain facilities, including the following:            Patient/family informed of bed offers received.  Patient chooses bed at       Physician recommends and patient chooses bed at      Patient to be transferred to   on  .  Patient to be transferred to facility by       Patient family notified on   of transfer.  Name of family member notified:        PHYSICIAN       Additional Comment:    _______________________________________________ Liz Beach MSW, [place], [place], [id number]

## 2015-11-18 NOTE — NC FL2 (Signed)
[place] LEVEL OF CARE SCREENING TOOL     IDENTIFICATION  Patient Name: Victor Clarke Birthdate: May 06, 1929 Sex: male Admission Date (Current Location): 11/07/2015  [facility] and [place] Number:  Herbalist and Address:  [facility] [place], [place] [address], [place], [place] [address]      Provider Number: [id number]  Attending Physician Name and Address:  Thurnell Lose, MD  Relative Name and Phone Number:  Brother,     Current Level of Care: Hospital Recommended Level of Care: [place] Prior Approval Number:    Date Approved/Denied:   PASRR Number: [id number]  Discharge Plan: SNF    Current Diagnoses: Patient Active Problem List   Diagnosis Date Noted  . Malnutrition of moderate degree 11/15/2015  . Acute respiratory failure with hypoxemia ([place])   . Acute respiratory failure with hypercapnia ([place])   . Acute respiratory failure ([place])   . Alcohol intoxication ([place])   . Altered mental state   . Altered mental status 11/07/2015  . Acute encephalopathy 11/07/2015  . Fall from steps 11/07/2015  . Anemia 11/07/2015  . Alcohol abuse with intoxication ([place]) 11/07/2015  . Thrombocytopenia ([place]) 11/07/2015  . Pyuria 11/07/2015  . Alcohol withdrawal delirium ([place]) 08/08/2015  . Dehydration 08/08/2015  . UTI (urinary tract infection) 08/08/2015  . High anion gap metabolic acidosis [id number]  . Dizziness 07/31/2015  . Orthostatic hypotension 07/31/2015  . Lactic acidosis 07/31/2015  . Ectopic atrial tachycardia ([place])   . Hypercholesteremia   . Dyspnea on exertion 09/16/2014  . Hypertension 09/16/2014  . New onset a-fib ([place]) 09/16/2014  . Parkinson disease ([place]) 08/22/2014    Orientation RESPIRATION BLADDER Height & Weight    Self, Place  Normal Incontinent, Indwelling catheter 5\' 9"  (175.3 cm) 125 lbs.  BEHAVIORAL SYMPTOMS/MOOD NEUROLOGICAL BOWEL NUTRITION STATUS   (NONE)  (NONE) Incontinent Diet  (Regular)  AMBULATORY STATUS COMMUNICATION OF NEEDS Skin   Extensive Assist Verbally Other (Comment) (Laceration of anterior head)                       Personal Care Assistance Level of Assistance  Bathing, Dressing Bathing Assistance: Limited assistance   Dressing Assistance: Limited assistance     Functional Limitations Info  Sight, Hearing, Speech Sight Info: Adequate Hearing Info: Adequate Speech Info: Adequate    SPECIAL CARE FACTORS FREQUENCY  PT (By licensed PT), OT (By licensed OT)     PT Frequency: 5x/week OT Frequency: 5x/week            Contractures Contractures Info: Not present    Additional Factors Info  Code Status, Allergies, Insulin Sliding Scale, Isolation Precautions Code Status Info: DNR Allergies Info: NKDA   Insulin Sliding Scale Info: 6x/day Isolation Precautions Info: MRSA      Current Medications (11/18/2015):  This is the current hospital active medication list Current Facility-Administered Medications  Medication Dose Route Frequency Provider Last Rate Last Dose  . 0.9 %  sodium chloride infusion   Intravenous Continuous Juanito Doom, MD 10 mL/hr at 11/14/15 2000    . allopurinol (ZYLOPRIM) tablet 100 mg  100 mg Oral Daily Rush Farmer, MD   100 mg at 11/18/15 0901  . antiseptic oral rinse (CPC / CETYLPYRIDINIUM CHLORIDE 0.05%) solution 7 mL  7 mL Mouth Rinse BID Rush Farmer, MD   7 mL at 11/18/15 0901  . aspirin chewable tablet 81 mg  81 mg Oral Daily Wesam G  Nelda Marseille, MD   81 mg at 11/18/15 0901  . atorvastatin (LIPITOR) tablet 40 mg  40 mg Oral Daily Rush Farmer, MD   40 mg at 11/18/15 0901  . carbidopa-levodopa (SINEMET IR) 25-100 MG per tablet immediate release 0.5 tablet  0.5 tablet Oral TID Rush Farmer, MD   0.5 tablet at 11/18/15 0857  . diltiazem (CARDIZEM CD) 24 hr capsule 120 mg  120 mg Oral Daily Rush Farmer, MD   120 mg at 11/18/15 0945  . folic acid (FOLVITE) tablet 1 mg  1 mg Oral Daily Jake Church  Masters, RPH   1 mg at 11/18/15 0901  . haloperidol lactate (HALDOL) injection 4 mg  4 mg Intravenous Q6H PRN Chesley Mires, MD   4 mg at 11/12/15 2333  . heparin injection 5,000 Units  5,000 Units Subcutaneous 3 times per day Raylene Miyamoto, MD   5,000 Units at 11/18/15 [phone]  . hydrALAZINE (APRESOLINE) injection 10-40 mg  10-40 mg Intravenous Q4H PRN Juanito Doom, MD      . insulin aspart (novoLOG) injection 0-9 Units  0-9 Units Subcutaneous 6 times per day Javier Glazier, MD   1 Units at 11/18/15 [phone]  . multivitamin with minerals tablet 1 tablet  1 tablet Oral Daily Rush Farmer, MD   1 tablet at 11/18/15 0901  . pneumococcal 23 valent vaccine (PNU-IMMUNE) injection 0.5 mL  0.5 mL Intramuscular Tomorrow-1000 Jonetta Osgood, MD   Stopped at 11/11/15 1000  . thiamine (VITAMIN B-1) tablet 100 mg  100 mg Oral Daily Jake Church Masters, RPH   100 mg at 11/18/15 0901     Discharge Medications: Please see discharge summary for a list of discharge medications.  Relevant Imaging Results:  Relevant Lab Results:   Additional Information SSN: [id number]  Liz Beach MSW, [place], [place], [id number]

## 2015-11-18 NOTE — Progress Notes (Signed)
Patient Demographics:    Victor Clarke, is a 81 y.o. male, DOB - 08-05-1929, [id number]  Admit date - 11/07/2015   Admitting Physician Javier Glazier, MD  Outpatient Primary MD for the patient is Aretta Nip, MD  LOS - 58  Summary  81 year old male with PMH as below, which includes HTN, HLD, COPD, and Parkinson's. He also has a history of ETOH abuse and has presented to ED several times recently for fall and was found to be intoxicated each time. 12/20 he again presented to [facility] ED s/p falling down steps at home and was found down for unknown time. He was altered and intoxicated in ED. He had lacerations to the front of his scalp and forehead. UA was suspicious for UTI and he was started on ABX. CT head/cspine was obtained and showed no acute intracranial issues. Minimally displaced nasal bone fracture. No C-Spine fracture. He was improved to the hospitalis team and was placed on CIWA protocol. His encephalopathy was much improved the following day 12/21. 12/22 he was becoming more lethargic and was moved to SDU for closer monitoring, however he was still alert and able to follow commands. As the night went on his mental status progressively diminished and his respiratory status decline. PCCM consulted for further evaluation.   He was found to be extremely delirious and unable to maintain his airway and was extremely lethargic along with evidence of hypoxemia, he was intubated and admitted to critical care ICU on 11/10/2015, thereafter he was extubated on 11/13/2015. He was stabilized and transferred to hospitalist service under my care on 11/18/2015   Chief Complaint  Patient presents with  . Fall        Subjective:    Victor Clarke today has, No headache, No chest pain, No abdominal pain - No  Nausea, No new weakness tingling or numbness, No Cough - SOB.     Assessment  & Plan :     1. Acute respiratory failure - acquiring intubation for 3 days most likely due to lethargy and inability to maintenance airway, has been successfully extubated, now on room air. Has underlying COPD without any evidence of exacerbation. Continue supportive care with oxygen and nebulizer treatments. Has been seen by speech on regular diet.   2. History of alcohol abuse, intoxication, withdrawal and DTs. Counseled to quit. Now off [place] protocol, will place on folic acid thiamine and monitor. Increase activity. Will require placement.   3. Dyslipidemia. On statin continue.   4. History of gout. On allopurinol continue.   5. Sepsis secondary to UTI. Has been treated in ICU has completed antibiotics. Sepsis physiology has resolved.   6. HX of Parkinson's. Continue Sinemet.   7. Thrombocytopenia. Secondary to alcohol abuse. Monitor no evidence of bleeding.   8. Mechanical fall after alcohol intoxication at home. History of facial laceration and nasal fracture. Supportive care.   9. Essential hypertension. Currently on Cardizem monitor     Code Status : Full  Family Communication  : None  Disposition Plan  : TBD  Consults  :  PCCM  Procedures  :     DVT Prophylaxis  :   Heparin   Lab Results  Component Value Date   PLT  376 11/18/2015    Inpatient Medications  Scheduled Meds: . allopurinol  100 mg Oral Daily  . antiseptic oral rinse  7 mL Mouth Rinse BID  . aspirin  81 mg Oral Daily  . atorvastatin  40 mg Oral Daily  . carbidopa-levodopa  0.5 tablet Oral TID  . diltiazem  120 mg Oral Daily  . folic acid  1 mg Oral Daily  . heparin subcutaneous  5,000 Units Subcutaneous 3 times per day  . insulin aspart  0-9 Units Subcutaneous 6 times per day  . multivitamin with minerals  1 tablet Oral Daily  . pneumococcal 23 valent vaccine  0.5 mL Intramuscular Tomorrow-1000  .  thiamine  100 mg Oral Daily   Continuous Infusions: . sodium chloride 10 mL/hr at 11/14/15 2000   PRN Meds:.haloperidol lactate, hydrALAZINE  Antibiotics  :     Anti-infectives    Start     Dose/Rate Route Frequency Ordered Stop   11/10/15 0600  piperacillin-tazobactam (ZOSYN) IVPB 3.375 g     3.375 g 12.5 mL/hr over 240 Minutes Intravenous 3 times per day 11/09/15 2134 11/14/15 0234   11/09/15 2145  piperacillin-tazobactam (ZOSYN) IVPB 3.375 g     3.375 g 100 mL/hr over 30 Minutes Intravenous  Once 11/09/15 2134 11/09/15 2343   11/08/15 2000  cefTRIAXone (ROCEPHIN) 1 g in dextrose 5 % 50 mL IVPB  Status:  Discontinued     1 g 100 mL/hr over 30 Minutes Intravenous Every 24 hours 11/08/15 1254 11/09/15 2047   11/07/15 1945  cefTRIAXone (ROCEPHIN) 1 g in dextrose 5 % 50 mL IVPB     1 g 100 mL/hr over 30 Minutes Intravenous  Once 11/07/15 1931 11/07/15 2032        Objective:   Filed Vitals:   11/17/15 2106 11/18/15 0445 11/18/15 0623 11/18/15 0903  BP: 122/76 142/65  126/57  Pulse: 68 68  68  Temp: 98.4 F (36.9 C) 98.4 F (36.9 C)    TempSrc: Oral Oral    Resp: 18 18    Height:      Weight:   56.9 kg (125 lb 7.1 oz)   SpO2: 98% 97%      Wt Readings from Last 3 Encounters:  11/18/15 56.9 kg (125 lb 7.1 oz)  09/13/15 61.236 kg (135 lb)  08/17/15 60.782 kg (134 lb)     Intake/Output Summary (Last 24 hours) at 11/18/15 1142 Last data filed at 11/17/15 1500  Gross per 24 hour  Intake    200 ml  Output    105 ml  Net     95 ml     Physical Exam  Awake , confused, No new F.N deficits, Normal affect [place].AT,PERRAL Supple Neck,No JVD, No cervical lymphadenopathy appriciated.  Symmetrical Chest wall movement, Good air movement bilaterally, CTAB RRR,No Gallops,Rubs or new Murmurs, No Parasternal Heave +ve B.Sounds, Abd Soft, No tenderness, No organomegaly appriciated, No rebound - guarding or rigidity. No Cyanosis, Clubbing or edema, No new Rash or bruise       Data Review:   Micro Results Recent Results (from the past 240 hour(s))  MRSA PCR Screening     Status: Abnormal   Collection Time: 11/09/15  6:03 PM  Result Value Ref Range Status   MRSA by PCR POSITIVE (A) NEGATIVE Final    Comment:        The GeneXpert MRSA Assay (FDA approved for NASAL specimens only), is one component of a comprehensive MRSA colonization surveillance program. It  is not intended to diagnose MRSA infection nor to guide or monitor treatment for MRSA infections. RESULT CALLED TO, READ BACK BY AND VERIFIED WITH: Annice Pih RN [id number] 11/09/15 A BROWNING   Culture, blood (Routine X 2) w Reflex to ID Panel     Status: None   Collection Time: 11/09/15 10:00 PM  Result Value Ref Range Status   Specimen Description BLOOD RIGHT HAND  Final   Special Requests BOTTLES DRAWN AEROBIC ONLY [place]  Final   Culture NO GROWTH 5 DAYS  Final   Report Status 11/14/2015 FINAL  Final  Culture, blood (Routine X 2) w Reflex to ID Panel     Status: None   Collection Time: 11/09/15 10:08 PM  Result Value Ref Range Status   Specimen Description BLOOD RIGHT HAND  Final   Special Requests BOTTLES DRAWN AEROBIC ONLY 5CC  Final   Culture NO GROWTH 5 DAYS  Final   Report Status 11/14/2015 FINAL  Final  Culture, respiratory (NON-Expectorated)     Status: None   Collection Time: 11/11/15  3:37 AM  Result Value Ref Range Status   Specimen Description TRACHEAL ASPIRATE  Final   Special Requests Normal  Final   Gram Stain   Final    MODERATE WBC MODERATE SQUAMOUS EPITHELIAL CELLS PRESENT NO ORGANISMS SEEN Performed at [facility]    Culture   Final    NORMAL OROPHARYNGEAL FLORA Performed at [facility]    Report Status 11/14/2015 FINAL  Final    Radiology Reports Ct Head Wo Contrast  11/10/2015  CLINICAL DATA:  Altered mental status EXAM: CT HEAD WITHOUT CONTRAST TECHNIQUE: Contiguous axial images were obtained from the base of the skull through the vertex without  intravenous contrast. COMPARISON:  11/07/2015 FINDINGS: Skull and Sinuses:Central forehead swelling without underlying osseous abnormality. Re- demonstrated central nasal arch fracture without progressed displacement. Visualized orbits: Cataract resection on the left and probable glaucoma reservoir on the right. Brain: No evidence of acute infarction, hemorrhage, hydrocephalus, or mass lesion/mass effect. Generalized atrophy with ventriculomegaly. Chronic small vessel disease with mild ischemic gliosis around the lateral ventricles. IMPRESSION: 1. No acute intracranial finding. 2. Atrophy and chronic small vessel disease. 3. Forehead contusion and known nasal arch fracture. Electronically Signed   By: Monte Fantasia M.D.   On: 11/10/2015 05:07   Ct Head Wo Contrast  11/07/2015  CLINICAL DATA:  Passed out today while cooking, now with cut on the nose. EXAM: CT HEAD WITHOUT CONTRAST CT MAXILLOFACIAL WITHOUT CONTRAST CT CERVICAL SPINE WITHOUT CONTRAST TECHNIQUE: Multidetector CT imaging of the head, cervical spine, and maxillofacial structures were performed using the standard protocol without intravenous contrast. Multiplanar CT image reconstructions of the cervical spine and maxillofacial structures were also generated. COMPARISON:  Head CT - 07/31/2015; 06/12/2015; 12/28/2014 FINDINGS: CT HEAD FINDINGS Similar findings of advanced atrophy with sulcal prominence centralized volume loss with commensurate ex vacuo dilatation of the ventricular system. Scattered periventricular hypodensities compatible microvascular ischemic disease. Bilateral basal ganglial calcifications. Given background parenchymal abnormalities, there is no CT evidence of superimposed acute large territory infarct. No intraparenchymal or extra-axial mass or hemorrhage. Unchanged size and configuration of the ventricles and basilar cisterns. No midline shift. CT MAXILLOFACIAL FINDINGS Evaluation of the facial bones is degraded secondary to  patient motion artifact. There is mild soft tissue swelling about the bridge of the nose with potential mildly displaced nasal bone fracture (image 67, series 5). Otherwise, no definite displaced facial fractures given limitation of patient motion. Normal  appearance of the bilateral pterygoid plates. Normal appearance of the mandible. The bilateral mandibular condyles are normally located. Normal appearance of the bilateral zygomatic arches. Normal noncontrast appearance of the bilateral orbits and globes. Post left-sided cataract surgery. There is mild rightward nasal septal deviation. There is minimal mucosal thickening within the left maxillary sinus. The remaining paranasal sinuses and mastoid air cells are normally aerated. No air-fluid levels. CT CERVICAL SPINE FINDINGS Evaluation of cervical spine is minimally degraded secondary to patient motion artifact. C1 to the superior endplate of T1 is imaged. There is mild (approximately 3 mm) of presumably degenerative retrolisthesis of C4 upon C5. Otherwise, normal alignment of the cervical spine. No anterolisthesis or retrolisthesis. The bilateral facets are normally aligned. The dens is normally positioned between the lateral masses of C1. Normal atlantodental and atlantoaxial articulations. No fracture or static subluxation of the cervical spine. Cervical vertebral and heights are preserved. Prevertebral soft tissues are normal. Moderate to severe multilevel cervical spine DDD, likely worse at C4-C5 and to a lesser extent, C3-C4 and C5-C6 with disc space height loss, endplate irregularity and sclerosis. There is a suspected partial ankylosis involving the C4-C5 and C5-C6 intervertebral disc spaces. Atherosclerotic plaque within the bilateral carotid bulbs. No bulky cervical lymphadenopathy on this noncontrast examination. Limited visualization lung apices demonstrates biapical bullous emphysematous change, right greater than left. IMPRESSION: 1. Advanced atrophy  and microvascular ischemic disease without acute intracranial process. 2. Suspected minimally displaced nasal bone fracture. No additional facial fractures identified on this motion degraded examination. 3. Motion degraded examination without evidence of fracture or static subluxation of the cervical spine. 4. Moderate to severe multilevel cervical spine DDD. Electronically Signed   By: Sandi Mariscal M.D.   On: 11/07/2015 17:22   Ct Cervical Spine Wo Contrast  11/07/2015  CLINICAL DATA:  Passed out today while cooking, now with cut on the nose. EXAM: CT HEAD WITHOUT CONTRAST CT MAXILLOFACIAL WITHOUT CONTRAST CT CERVICAL SPINE WITHOUT CONTRAST TECHNIQUE: Multidetector CT imaging of the head, cervical spine, and maxillofacial structures were performed using the standard protocol without intravenous contrast. Multiplanar CT image reconstructions of the cervical spine and maxillofacial structures were also generated. COMPARISON:  Head CT - 07/31/2015; 06/12/2015; 12/28/2014 FINDINGS: CT HEAD FINDINGS Similar findings of advanced atrophy with sulcal prominence centralized volume loss with commensurate ex vacuo dilatation of the ventricular system. Scattered periventricular hypodensities compatible microvascular ischemic disease. Bilateral basal ganglial calcifications. Given background parenchymal abnormalities, there is no CT evidence of superimposed acute large territory infarct. No intraparenchymal or extra-axial mass or hemorrhage. Unchanged size and configuration of the ventricles and basilar cisterns. No midline shift. CT MAXILLOFACIAL FINDINGS Evaluation of the facial bones is degraded secondary to patient motion artifact. There is mild soft tissue swelling about the bridge of the nose with potential mildly displaced nasal bone fracture (image 67, series 5). Otherwise, no definite displaced facial fractures given limitation of patient motion. Normal appearance of the bilateral pterygoid plates. Normal appearance  of the mandible. The bilateral mandibular condyles are normally located. Normal appearance of the bilateral zygomatic arches. Normal noncontrast appearance of the bilateral orbits and globes. Post left-sided cataract surgery. There is mild rightward nasal septal deviation. There is minimal mucosal thickening within the left maxillary sinus. The remaining paranasal sinuses and mastoid air cells are normally aerated. No air-fluid levels. CT CERVICAL SPINE FINDINGS Evaluation of cervical spine is minimally degraded secondary to patient motion artifact. C1 to the superior endplate of T1 is imaged. There is mild (approximately 3 mm)  of presumably degenerative retrolisthesis of C4 upon C5. Otherwise, normal alignment of the cervical spine. No anterolisthesis or retrolisthesis. The bilateral facets are normally aligned. The dens is normally positioned between the lateral masses of C1. Normal atlantodental and atlantoaxial articulations. No fracture or static subluxation of the cervical spine. Cervical vertebral and heights are preserved. Prevertebral soft tissues are normal. Moderate to severe multilevel cervical spine DDD, likely worse at C4-C5 and to a lesser extent, C3-C4 and C5-C6 with disc space height loss, endplate irregularity and sclerosis. There is a suspected partial ankylosis involving the C4-C5 and C5-C6 intervertebral disc spaces. Atherosclerotic plaque within the bilateral carotid bulbs. No bulky cervical lymphadenopathy on this noncontrast examination. Limited visualization lung apices demonstrates biapical bullous emphysematous change, right greater than left. IMPRESSION: 1. Advanced atrophy and microvascular ischemic disease without acute intracranial process. 2. Suspected minimally displaced nasal bone fracture. No additional facial fractures identified on this motion degraded examination. 3. Motion degraded examination without evidence of fracture or static subluxation of the cervical spine. 4. Moderate  to severe multilevel cervical spine DDD. Electronically Signed   By: Sandi Mariscal M.D.   On: 11/07/2015 17:22   Dg Chest Port 1 View  11/16/2015  CLINICAL DATA:  Intubated EXAM: PORTABLE CHEST 1 VIEW COMPARISON:  11/15/2015 FINDINGS: Endotracheal tube is 4.4 cm above the carina. Right central line is unchanged. Heart is borderline in size. Improving patchy bilateral airspace opacities. No visible effusions. IMPRESSION: Improving bilateral airspace disease. Electronically Signed   By: Rolm Baptise M.D.   On: 11/16/2015 08:12       Ct Maxillofacial Wo Cm  11/07/2015  CLINICAL DATA:  Passed out today while cooking, now with cut on the nose. EXAM: CT HEAD WITHOUT CONTRAST CT MAXILLOFACIAL WITHOUT CONTRAST CT CERVICAL SPINE WITHOUT CONTRAST TECHNIQUE: Multidetector CT imaging of the head, cervical spine, and maxillofacial structures were performed using the standard protocol without intravenous contrast. Multiplanar CT image reconstructions of the cervical spine and maxillofacial structures were also generated. COMPARISON:  Head CT - 07/31/2015; 06/12/2015; 12/28/2014 FINDINGS: CT HEAD FINDINGS Similar findings of advanced atrophy with sulcal prominence centralized volume loss with commensurate ex vacuo dilatation of the ventricular system. Scattered periventricular hypodensities compatible microvascular ischemic disease. Bilateral basal ganglial calcifications. Given background parenchymal abnormalities, there is no CT evidence of superimposed acute large territory infarct. No intraparenchymal or extra-axial mass or hemorrhage. Unchanged size and configuration of the ventricles and basilar cisterns. No midline shift. CT MAXILLOFACIAL FINDINGS Evaluation of the facial bones is degraded secondary to patient motion artifact. There is mild soft tissue swelling about the bridge of the nose with potential mildly displaced nasal bone fracture (image 67, series 5). Otherwise, no definite displaced facial fractures  given limitation of patient motion. Normal appearance of the bilateral pterygoid plates. Normal appearance of the mandible. The bilateral mandibular condyles are normally located. Normal appearance of the bilateral zygomatic arches. Normal noncontrast appearance of the bilateral orbits and globes. Post left-sided cataract surgery. There is mild rightward nasal septal deviation. There is minimal mucosal thickening within the left maxillary sinus. The remaining paranasal sinuses and mastoid air cells are normally aerated. No air-fluid levels. CT CERVICAL SPINE FINDINGS Evaluation of cervical spine is minimally degraded secondary to patient motion artifact. C1 to the superior endplate of T1 is imaged. There is mild (approximately 3 mm) of presumably degenerative retrolisthesis of C4 upon C5. Otherwise, normal alignment of the cervical spine. No anterolisthesis or retrolisthesis. The bilateral facets are normally aligned. The dens is normally  positioned between the lateral masses of C1. Normal atlantodental and atlantoaxial articulations. No fracture or static subluxation of the cervical spine. Cervical vertebral and heights are preserved. Prevertebral soft tissues are normal. Moderate to severe multilevel cervical spine DDD, likely worse at C4-C5 and to a lesser extent, C3-C4 and C5-C6 with disc space height loss, endplate irregularity and sclerosis. There is a suspected partial ankylosis involving the C4-C5 and C5-C6 intervertebral disc spaces. Atherosclerotic plaque within the bilateral carotid bulbs. No bulky cervical lymphadenopathy on this noncontrast examination. Limited visualization lung apices demonstrates biapical bullous emphysematous change, right greater than left. IMPRESSION: 1. Advanced atrophy and microvascular ischemic disease without acute intracranial process. 2. Suspected minimally displaced nasal bone fracture. No additional facial fractures identified on this motion degraded examination. 3. Motion  degraded examination without evidence of fracture or static subluxation of the cervical spine. 4. Moderate to severe multilevel cervical spine DDD. Electronically Signed   By: Sandi Mariscal M.D.   On: 11/07/2015 17:22     CBC  Recent Labs Lab 11/14/15 0421 11/15/15 0330 11/16/15 0305 11/17/15 0243 11/18/15 0713  WBC 4.2 5.6 6.5 7.2 5.6  HGB 8.8* 10.1* 10.8* 10.7* 11.6*  HCT 26.9* 30.9* 33.4* 33.1* 34.2*  PLT 172 231 278 291 376  MCV 96.4 98.1 98.8 98.8 95.8  MCH 31.5 32.1 32.0 31.9 32.5  MCHC 32.7 32.7 32.3 32.3 33.9  RDW 18.4* 18.4* 17.9* 17.6* 17.1*    Chemistries   Recent Labs Lab 11/14/15 0421 11/15/15 0330 11/16/15 0305 11/17/15 0243 11/18/15 0713  NA 143 144 144 144 144  K 2.8* 4.3 4.5 3.9 3.8  CL 110 107 104 105 106  CO2 25 27 28 29 27   GLUCOSE 115* 163* 143* 140* 130*  BUN 10 17 22* 27* 21*  CREATININE 0.96 1.03 1.09 0.95 0.88  CALCIUM 7.7* 8.7* 8.9 8.9 9.3  MG 2.2 1.6* 2.2 2.0 2.0   ------------------------------------------------------------------------------------------------------------------ estimated creatinine clearance is 50.3 mL/min (by C-G formula based on Cr of 0.88). ------------------------------------------------------------------------------------------------------------------ No results for input(s): HGBA1C in the last 72 hours. ------------------------------------------------------------------------------------------------------------------ No results for input(s): CHOL, HDL, LDLCALC, TRIG, CHOLHDL, LDLDIRECT in the last 72 hours. ------------------------------------------------------------------------------------------------------------------ No results for input(s): TSH, T4TOTAL, T3FREE, THYROIDAB in the last 72 hours.  Invalid input(s): FREET3 ------------------------------------------------------------------------------------------------------------------ No results for input(s): VITAMINB12, FOLATE, FERRITIN, TIBC, IRON, RETICCTPCT in the  last 72 hours.  Coagulation profile No results for input(s): INR, PROTIME in the last 168 hours.  No results for input(s): DDIMER in the last 72 hours.  Cardiac Enzymes No results for input(s): CKMB, TROPONINI, MYOGLOBIN in the last 168 hours.  Invalid input(s): CK ------------------------------------------------------------------------------------------------------------------ Invalid input(s): POCBNP   Time Spent in minutes   35   SINGH,PRASHANT K M.D on 11/18/2015 at 11:42 AM  Between 7am to 7pm - Pager - [phone]  After 7pm go to [email or web address] - password [facility]  [facility] -  Office  [phone]

## 2015-11-19 LAB — GLUCOSE, CAPILLARY
GLUCOSE-CAPILLARY: 120 mg/dL — AB (ref 65–99)
GLUCOSE-CAPILLARY: 133 mg/dL — AB (ref 65–99)
Glucose-Capillary: 112 mg/dL — ABNORMAL HIGH (ref 65–99)
Glucose-Capillary: 117 mg/dL — ABNORMAL HIGH (ref 65–99)
Glucose-Capillary: 108 mg/dL — ABNORMAL HIGH (ref 65–99)
Glucose-Capillary: 121 mg/dL — ABNORMAL HIGH (ref 65–99)

## 2015-11-19 MED ORDER — HALOPERIDOL LACTATE 5 MG/ML IJ SOLN
2.0000 mg | Freq: Four times a day (QID) | INTRAMUSCULAR | Status: DC | PRN
  Filled 2015-11-19: qty 1

## 2015-11-19 MED ORDER — LORAZEPAM 2 MG/ML IJ SOLN
0.5000 mg | Freq: Three times a day (TID) | INTRAMUSCULAR | Status: DC | PRN
  Administered 2015-11-19: 0.5 mg via INTRAVENOUS
  Filled 2015-11-19: qty 1

## 2015-11-19 NOTE — Progress Notes (Signed)
Paged MD because pt became agitated and started jumping out of bed. Pt is a high fall risk. MD returned page and ordered 0.5 mg of Ativan. Will carry out orders at this time.

## 2015-11-19 NOTE — Progress Notes (Signed)
Patient Demographics:    Victor Clarke, is a 82 y.o. male, DOB - Mar 06, 1929, [id number]  Admit date - 11/07/2015   Admitting Physician Javier Glazier, MD  Outpatient Primary MD for the patient is Aretta Nip, MD  LOS - 21  Summary  82 year old male with PMH as below, which includes HTN, HLD, COPD, and Parkinson's. He also has a history of ETOH abuse and has presented to ED several times recently for fall and was found to be intoxicated each time. 12/20 he again presented to [facility] ED s/p falling down steps at home and was found down for unknown time. He was altered and intoxicated in ED. He had lacerations to the front of his scalp and forehead. UA was suspicious for UTI and he was started on ABX. CT head/cspine was obtained and showed no acute intracranial issues. Minimally displaced nasal bone fracture. No C-Spine fracture. He was improved to the hospitalis team and was placed on CIWA protocol. His encephalopathy was much improved the following day 12/21. 12/22 he was becoming more lethargic and was moved to SDU for closer monitoring, however he was still alert and able to follow commands. As the night went on his mental status progressively diminished and his respiratory status decline. PCCM consulted for further evaluation.   He was found to be extremely delirious and unable to maintain his airway and was extremely lethargic along with evidence of hypoxemia, he was intubated and admitted to critical care ICU on 11/10/2015, thereafter he was extubated on 11/13/2015. He was stabilized and transferred to hospitalist service under my care on 11/18/2015   Chief Complaint  Patient presents with  . Fall        Subjective:    Waldon Merl today has, No headache, No chest pain, No abdominal pain - No  Nausea, No new weakness tingling or numbness, No Cough - SOB.     Assessment  & Plan :     1. Acute respiratory failure - acquiring intubation for 3 days most likely due to lethargy and inability to maintenance airway, has been successfully extubated, now on room air. Has underlying COPD without any evidence of exacerbation. Continue supportive care with oxygen and nebulizer treatments. Has been seen by speech on regular diet.   2. History of alcohol abuse, intoxication, withdrawal and DTs. Counseled to quit. Now off Sarben protocol, will place on folic acid thiamine and monitor. Increase activity. Will require placement.   3. Dyslipidemia. On statin continue.   4. History of gout. On allopurinol continue.   5. Sepsis secondary to UTI. Has been treated in ICU has completed antibiotics. Sepsis physiology has resolved.   6. HX of Parkinson's. Continue Sinemet.   7. Thrombocytopenia. Secondary to alcohol abuse. Monitor no evidence of bleeding.   8. Mechanical fall after alcohol intoxication at home. History of facial laceration and nasal fracture. Supportive care.   9. Essential hypertension. Currently on Cardizem monitor   10. Underlying dementia and delirium. Supportive care.     Code Status : Full  Family Communication  : None  Disposition Plan  : TBD  Consults  :  PCCM  Procedures  :     DVT Prophylaxis  :   Heparin  Lab Results  Component Value Date   PLT 376 11/18/2015    Inpatient Medications  Scheduled Meds: . allopurinol  100 mg Oral Daily  . antiseptic oral rinse  7 mL Mouth Rinse BID  . aspirin  81 mg Oral Daily  . atorvastatin  40 mg Oral Daily  . carbidopa-levodopa  0.5 tablet Oral TID  . diltiazem  120 mg Oral Daily  . folic acid  1 mg Oral Daily  . heparin subcutaneous  5,000 Units Subcutaneous 3 times per day  . insulin aspart  0-9 Units Subcutaneous 6 times per day  . multivitamin with minerals  1 tablet Oral Daily  . pneumococcal  23 valent vaccine  0.5 mL Intramuscular Tomorrow-1000  . thiamine  100 mg Oral Daily   Continuous Infusions: . sodium chloride 10 mL/hr at 11/14/15 2000   PRN Meds:.haloperidol lactate, hydrALAZINE, LORazepam  Antibiotics  :     Anti-infectives    Start     Dose/Rate Route Frequency Ordered Stop   11/10/15 0600  piperacillin-tazobactam (ZOSYN) IVPB 3.375 g     3.375 g 12.5 mL/hr over 240 Minutes Intravenous 3 times per day 11/09/15 2134 11/14/15 0234   11/09/15 2145  piperacillin-tazobactam (ZOSYN) IVPB 3.375 g     3.375 g 100 mL/hr over 30 Minutes Intravenous  Once 11/09/15 2134 11/09/15 2343   11/08/15 2000  cefTRIAXone (ROCEPHIN) 1 g in dextrose 5 % 50 mL IVPB  Status:  Discontinued     1 g 100 mL/hr over 30 Minutes Intravenous Every 24 hours 11/08/15 1254 11/09/15 2047   11/07/15 1945  cefTRIAXone (ROCEPHIN) 1 g in dextrose 5 % 50 mL IVPB     1 g 100 mL/hr over 30 Minutes Intravenous  Once 11/07/15 1931 11/07/15 2032        Objective:   Filed Vitals:   11/18/15 1434 11/18/15 2146 11/19/15 0502 11/19/15 0949  BP: 106/53 124/68 104/58 114/69  Pulse: 64 64 64 71  Temp: 99.1 F (37.3 C) 98.6 F (37 C) 98.6 F (37 C) 98 F (36.7 C)  TempSrc: Oral Oral Oral Oral  Resp: 18 18 18    Height:      Weight:   56.8 kg (125 lb 3.5 oz)   SpO2: 100% 100% 100% 100%    Wt Readings from Last 3 Encounters:  11/19/15 56.8 kg (125 lb 3.5 oz)  09/13/15 61.236 kg (135 lb)  08/17/15 60.782 kg (134 lb)     Intake/Output Summary (Last 24 hours) at 11/19/15 1001 Last data filed at 11/19/15 0944  Gross per 24 hour  Intake    240 ml  Output    225 ml  Net     15 ml     Physical Exam  Awake , confused, No new F.N deficits, Normal affect [place].AT,PERRAL Supple Neck,No JVD, No cervical lymphadenopathy appriciated.  Symmetrical Chest wall movement, Good air movement bilaterally, CTAB RRR,No Gallops,Rubs or new Murmurs, No Parasternal Heave +ve B.Sounds, Abd Soft, No tenderness, No  organomegaly appriciated, No rebound - guarding or rigidity. No Cyanosis, Clubbing or edema, No new Rash or bruise      Data Review:   Micro Results Recent Results (from the past 240 hour(s))  MRSA PCR Screening     Status: Abnormal   Collection Time: 11/09/15  6:03 PM  Result Value Ref Range Status   MRSA by PCR POSITIVE (A) NEGATIVE Final    Comment:        The GeneXpert MRSA Assay (FDA  approved for NASAL specimens only), is one component of a comprehensive MRSA colonization surveillance program. It is not intended to diagnose MRSA infection nor to guide or monitor treatment for MRSA infections. RESULT CALLED TO, READ BACK BY AND VERIFIED WITH: Annice Pih RN [id number] 11/09/15 A BROWNING   Culture, blood (Routine X 2) w Reflex to ID Panel     Status: None   Collection Time: 11/09/15 10:00 PM  Result Value Ref Range Status   Specimen Description BLOOD RIGHT HAND  Final   Special Requests BOTTLES DRAWN AEROBIC ONLY [place]  Final   Culture NO GROWTH 5 DAYS  Final   Report Status 11/14/2015 FINAL  Final  Culture, blood (Routine X 2) w Reflex to ID Panel     Status: None   Collection Time: 11/09/15 10:08 PM  Result Value Ref Range Status   Specimen Description BLOOD RIGHT HAND  Final   Special Requests BOTTLES DRAWN AEROBIC ONLY 5CC  Final   Culture NO GROWTH 5 DAYS  Final   Report Status 11/14/2015 FINAL  Final  Culture, respiratory (NON-Expectorated)     Status: None   Collection Time: 11/11/15  3:37 AM  Result Value Ref Range Status   Specimen Description TRACHEAL ASPIRATE  Final   Special Requests Normal  Final   Gram Stain   Final    MODERATE WBC MODERATE SQUAMOUS EPITHELIAL CELLS PRESENT NO ORGANISMS SEEN Performed at [facility]    Culture   Final    NORMAL OROPHARYNGEAL FLORA Performed at [facility]    Report Status 11/14/2015 FINAL  Final    Radiology Reports Ct Head Wo Contrast  11/10/2015  CLINICAL DATA:  Altered mental status EXAM: CT  HEAD WITHOUT CONTRAST TECHNIQUE: Contiguous axial images were obtained from the base of the skull through the vertex without intravenous contrast. COMPARISON:  11/07/2015 FINDINGS: Skull and Sinuses:Central forehead swelling without underlying osseous abnormality. Re- demonstrated central nasal arch fracture without progressed displacement. Visualized orbits: Cataract resection on the left and probable glaucoma reservoir on the right. Brain: No evidence of acute infarction, hemorrhage, hydrocephalus, or mass lesion/mass effect. Generalized atrophy with ventriculomegaly. Chronic small vessel disease with mild ischemic gliosis around the lateral ventricles. IMPRESSION: 1. No acute intracranial finding. 2. Atrophy and chronic small vessel disease. 3. Forehead contusion and known nasal arch fracture. Electronically Signed   By: Monte Fantasia M.D.   On: 11/10/2015 05:07   Ct Head Wo Contrast  11/07/2015  CLINICAL DATA:  Passed out today while cooking, now with cut on the nose. EXAM: CT HEAD WITHOUT CONTRAST CT MAXILLOFACIAL WITHOUT CONTRAST CT CERVICAL SPINE WITHOUT CONTRAST TECHNIQUE: Multidetector CT imaging of the head, cervical spine, and maxillofacial structures were performed using the standard protocol without intravenous contrast. Multiplanar CT image reconstructions of the cervical spine and maxillofacial structures were also generated. COMPARISON:  Head CT - 07/31/2015; 06/12/2015; 12/28/2014 FINDINGS: CT HEAD FINDINGS Similar findings of advanced atrophy with sulcal prominence centralized volume loss with commensurate ex vacuo dilatation of the ventricular system. Scattered periventricular hypodensities compatible microvascular ischemic disease. Bilateral basal ganglial calcifications. Given background parenchymal abnormalities, there is no CT evidence of superimposed acute large territory infarct. No intraparenchymal or extra-axial mass or hemorrhage. Unchanged size and configuration of the ventricles  and basilar cisterns. No midline shift. CT MAXILLOFACIAL FINDINGS Evaluation of the facial bones is degraded secondary to patient motion artifact. There is mild soft tissue swelling about the bridge of the nose with potential mildly displaced nasal bone fracture (  image 67, series 5). Otherwise, no definite displaced facial fractures given limitation of patient motion. Normal appearance of the bilateral pterygoid plates. Normal appearance of the mandible. The bilateral mandibular condyles are normally located. Normal appearance of the bilateral zygomatic arches. Normal noncontrast appearance of the bilateral orbits and globes. Post left-sided cataract surgery. There is mild rightward nasal septal deviation. There is minimal mucosal thickening within the left maxillary sinus. The remaining paranasal sinuses and mastoid air cells are normally aerated. No air-fluid levels. CT CERVICAL SPINE FINDINGS Evaluation of cervical spine is minimally degraded secondary to patient motion artifact. C1 to the superior endplate of T1 is imaged. There is mild (approximately 3 mm) of presumably degenerative retrolisthesis of C4 upon C5. Otherwise, normal alignment of the cervical spine. No anterolisthesis or retrolisthesis. The bilateral facets are normally aligned. The dens is normally positioned between the lateral masses of C1. Normal atlantodental and atlantoaxial articulations. No fracture or static subluxation of the cervical spine. Cervical vertebral and heights are preserved. Prevertebral soft tissues are normal. Moderate to severe multilevel cervical spine DDD, likely worse at C4-C5 and to a lesser extent, C3-C4 and C5-C6 with disc space height loss, endplate irregularity and sclerosis. There is a suspected partial ankylosis involving the C4-C5 and C5-C6 intervertebral disc spaces. Atherosclerotic plaque within the bilateral carotid bulbs. No bulky cervical lymphadenopathy on this noncontrast examination. Limited visualization  lung apices demonstrates biapical bullous emphysematous change, right greater than left. IMPRESSION: 1. Advanced atrophy and microvascular ischemic disease without acute intracranial process. 2. Suspected minimally displaced nasal bone fracture. No additional facial fractures identified on this motion degraded examination. 3. Motion degraded examination without evidence of fracture or static subluxation of the cervical spine. 4. Moderate to severe multilevel cervical spine DDD. Electronically Signed   By: Sandi Mariscal M.D.   On: 11/07/2015 17:22   Ct Cervical Spine Wo Contrast  11/07/2015  CLINICAL DATA:  Passed out today while cooking, now with cut on the nose. EXAM: CT HEAD WITHOUT CONTRAST CT MAXILLOFACIAL WITHOUT CONTRAST CT CERVICAL SPINE WITHOUT CONTRAST TECHNIQUE: Multidetector CT imaging of the head, cervical spine, and maxillofacial structures were performed using the standard protocol without intravenous contrast. Multiplanar CT image reconstructions of the cervical spine and maxillofacial structures were also generated. COMPARISON:  Head CT - 07/31/2015; 06/12/2015; 12/28/2014 FINDINGS: CT HEAD FINDINGS Similar findings of advanced atrophy with sulcal prominence centralized volume loss with commensurate ex vacuo dilatation of the ventricular system. Scattered periventricular hypodensities compatible microvascular ischemic disease. Bilateral basal ganglial calcifications. Given background parenchymal abnormalities, there is no CT evidence of superimposed acute large territory infarct. No intraparenchymal or extra-axial mass or hemorrhage. Unchanged size and configuration of the ventricles and basilar cisterns. No midline shift. CT MAXILLOFACIAL FINDINGS Evaluation of the facial bones is degraded secondary to patient motion artifact. There is mild soft tissue swelling about the bridge of the nose with potential mildly displaced nasal bone fracture (image 67, series 5). Otherwise, no definite displaced  facial fractures given limitation of patient motion. Normal appearance of the bilateral pterygoid plates. Normal appearance of the mandible. The bilateral mandibular condyles are normally located. Normal appearance of the bilateral zygomatic arches. Normal noncontrast appearance of the bilateral orbits and globes. Post left-sided cataract surgery. There is mild rightward nasal septal deviation. There is minimal mucosal thickening within the left maxillary sinus. The remaining paranasal sinuses and mastoid air cells are normally aerated. No air-fluid levels. CT CERVICAL SPINE FINDINGS Evaluation of cervical spine is minimally degraded secondary to patient motion  artifact. C1 to the superior endplate of T1 is imaged. There is mild (approximately 3 mm) of presumably degenerative retrolisthesis of C4 upon C5. Otherwise, normal alignment of the cervical spine. No anterolisthesis or retrolisthesis. The bilateral facets are normally aligned. The dens is normally positioned between the lateral masses of C1. Normal atlantodental and atlantoaxial articulations. No fracture or static subluxation of the cervical spine. Cervical vertebral and heights are preserved. Prevertebral soft tissues are normal. Moderate to severe multilevel cervical spine DDD, likely worse at C4-C5 and to a lesser extent, C3-C4 and C5-C6 with disc space height loss, endplate irregularity and sclerosis. There is a suspected partial ankylosis involving the C4-C5 and C5-C6 intervertebral disc spaces. Atherosclerotic plaque within the bilateral carotid bulbs. No bulky cervical lymphadenopathy on this noncontrast examination. Limited visualization lung apices demonstrates biapical bullous emphysematous change, right greater than left. IMPRESSION: 1. Advanced atrophy and microvascular ischemic disease without acute intracranial process. 2. Suspected minimally displaced nasal bone fracture. No additional facial fractures identified on this motion degraded  examination. 3. Motion degraded examination without evidence of fracture or static subluxation of the cervical spine. 4. Moderate to severe multilevel cervical spine DDD. Electronically Signed   By: Sandi Mariscal M.D.   On: 11/07/2015 17:22   Dg Chest Port 1 View  11/16/2015  CLINICAL DATA:  Intubated EXAM: PORTABLE CHEST 1 VIEW COMPARISON:  11/15/2015 FINDINGS: Endotracheal tube is 4.4 cm above the carina. Right central line is unchanged. Heart is borderline in size. Improving patchy bilateral airspace opacities. No visible effusions. IMPRESSION: Improving bilateral airspace disease. Electronically Signed   By: Rolm Baptise M.D.   On: 11/16/2015 08:12       Ct Maxillofacial Wo Cm  11/07/2015  CLINICAL DATA:  Passed out today while cooking, now with cut on the nose. EXAM: CT HEAD WITHOUT CONTRAST CT MAXILLOFACIAL WITHOUT CONTRAST CT CERVICAL SPINE WITHOUT CONTRAST TECHNIQUE: Multidetector CT imaging of the head, cervical spine, and maxillofacial structures were performed using the standard protocol without intravenous contrast. Multiplanar CT image reconstructions of the cervical spine and maxillofacial structures were also generated. COMPARISON:  Head CT - 07/31/2015; 06/12/2015; 12/28/2014 FINDINGS: CT HEAD FINDINGS Similar findings of advanced atrophy with sulcal prominence centralized volume loss with commensurate ex vacuo dilatation of the ventricular system. Scattered periventricular hypodensities compatible microvascular ischemic disease. Bilateral basal ganglial calcifications. Given background parenchymal abnormalities, there is no CT evidence of superimposed acute large territory infarct. No intraparenchymal or extra-axial mass or hemorrhage. Unchanged size and configuration of the ventricles and basilar cisterns. No midline shift. CT MAXILLOFACIAL FINDINGS Evaluation of the facial bones is degraded secondary to patient motion artifact. There is mild soft tissue swelling about the bridge of the  nose with potential mildly displaced nasal bone fracture (image 67, series 5). Otherwise, no definite displaced facial fractures given limitation of patient motion. Normal appearance of the bilateral pterygoid plates. Normal appearance of the mandible. The bilateral mandibular condyles are normally located. Normal appearance of the bilateral zygomatic arches. Normal noncontrast appearance of the bilateral orbits and globes. Post left-sided cataract surgery. There is mild rightward nasal septal deviation. There is minimal mucosal thickening within the left maxillary sinus. The remaining paranasal sinuses and mastoid air cells are normally aerated. No air-fluid levels. CT CERVICAL SPINE FINDINGS Evaluation of cervical spine is minimally degraded secondary to patient motion artifact. C1 to the superior endplate of T1 is imaged. There is mild (approximately 3 mm) of presumably degenerative retrolisthesis of C4 upon C5. Otherwise, normal alignment of the  cervical spine. No anterolisthesis or retrolisthesis. The bilateral facets are normally aligned. The dens is normally positioned between the lateral masses of C1. Normal atlantodental and atlantoaxial articulations. No fracture or static subluxation of the cervical spine. Cervical vertebral and heights are preserved. Prevertebral soft tissues are normal. Moderate to severe multilevel cervical spine DDD, likely worse at C4-C5 and to a lesser extent, C3-C4 and C5-C6 with disc space height loss, endplate irregularity and sclerosis. There is a suspected partial ankylosis involving the C4-C5 and C5-C6 intervertebral disc spaces. Atherosclerotic plaque within the bilateral carotid bulbs. No bulky cervical lymphadenopathy on this noncontrast examination. Limited visualization lung apices demonstrates biapical bullous emphysematous change, right greater than left. IMPRESSION: 1. Advanced atrophy and microvascular ischemic disease without acute intracranial process. 2. Suspected  minimally displaced nasal bone fracture. No additional facial fractures identified on this motion degraded examination. 3. Motion degraded examination without evidence of fracture or static subluxation of the cervical spine. 4. Moderate to severe multilevel cervical spine DDD. Electronically Signed   By: Sandi Mariscal M.D.   On: 11/07/2015 17:22     CBC  Recent Labs Lab 11/14/15 0421 11/15/15 0330 11/16/15 0305 11/17/15 0243 11/18/15 0713  WBC 4.2 5.6 6.5 7.2 5.6  HGB 8.8* 10.1* 10.8* 10.7* 11.6*  HCT 26.9* 30.9* 33.4* 33.1* 34.2*  PLT 172 231 278 291 376  MCV 96.4 98.1 98.8 98.8 95.8  MCH 31.5 32.1 32.0 31.9 32.5  MCHC 32.7 32.7 32.3 32.3 33.9  RDW 18.4* 18.4* 17.9* 17.6* 17.1*    Chemistries   Recent Labs Lab 11/14/15 0421 11/15/15 0330 11/16/15 0305 11/17/15 0243 11/18/15 0713  NA 143 144 144 144 144  K 2.8* 4.3 4.5 3.9 3.8  CL 110 107 104 105 106  CO2 25 27 28 29 27   GLUCOSE 115* 163* 143* 140* 130*  BUN 10 17 22* 27* 21*  CREATININE 0.96 1.03 1.09 0.95 0.88  CALCIUM 7.7* 8.7* 8.9 8.9 9.3  MG 2.2 1.6* 2.2 2.0 2.0   ------------------------------------------------------------------------------------------------------------------ estimated creatinine clearance is 50.2 mL/min (by C-G formula based on Cr of 0.88). ------------------------------------------------------------------------------------------------------------------ No results for input(s): HGBA1C in the last 72 hours. ------------------------------------------------------------------------------------------------------------------ No results for input(s): CHOL, HDL, LDLCALC, TRIG, CHOLHDL, LDLDIRECT in the last 72 hours. ------------------------------------------------------------------------------------------------------------------ No results for input(s): TSH, T4TOTAL, T3FREE, THYROIDAB in the last 72 hours.  Invalid input(s):  FREET3 ------------------------------------------------------------------------------------------------------------------ No results for input(s): VITAMINB12, FOLATE, FERRITIN, TIBC, IRON, RETICCTPCT in the last 72 hours.  Coagulation profile No results for input(s): INR, PROTIME in the last 168 hours.  No results for input(s): DDIMER in the last 72 hours.  Cardiac Enzymes No results for input(s): CKMB, TROPONINI, MYOGLOBIN in the last 168 hours.  Invalid input(s): CK ------------------------------------------------------------------------------------------------------------------ Invalid input(s): POCBNP   Time Spent in minutes   35   Haroldine Redler K M.D on 11/19/2015 at 10:01 AM  Between 7am to 7pm - Pager - [phone]  After 7pm go to [email or web address] - password [email or web address]  [facility] -  Office  [phone]

## 2015-11-20 LAB — GLUCOSE, CAPILLARY
GLUCOSE-CAPILLARY: 106 mg/dL — AB (ref 65–99)
Glucose-Capillary: 108 mg/dL — ABNORMAL HIGH (ref 65–99)
Glucose-Capillary: 125 mg/dL — ABNORMAL HIGH (ref 65–99)
Glucose-Capillary: 109 mg/dL — ABNORMAL HIGH (ref 65–99)

## 2015-11-20 MED ORDER — FOLIC ACID 1 MG PO TABS: 1.0000 mg | ORAL_TABLET | Freq: Every day | ORAL | Status: DC

## 2015-11-20 MED ORDER — THIAMINE HCL 100 MG PO TABS: 100.0000 mg | ORAL_TABLET | Freq: Every day | ORAL | Status: DC

## 2015-11-20 NOTE — Progress Notes (Signed)
2:27 PM report given to [employer] in [employer]

## 2015-11-20 NOTE — Progress Notes (Addendum)
Was the fall witnessed:  No (patient in camera room and able to watch fall on footage)  Patient condition before and after the fall:  At baseline. Alert x3 before and after fall.  Patient's reaction to the fall: patient stated he is okay. When asked if we could have done something to prevent the fall patient stated "I don't think so"  Name of the doctor that was notified including date and time: Dr. Candiss Norse 11/20/15 at 1114 am.   Any interventions and vital signs:  1100 Patient found on floor by Judson Roch, RN. Patient denied neck, head and hip tenderness. Patient helped off the floor and ambulated without pain back to bed. Patient denied headache.No decreased LOC. PEERLA. VSS. No assessment signs of injury. ROM performed on all extremities and patient denies discomfort or pain. Patient stated he got caught on cord and fell. Assessment documenting under flowsheet. Patient bearing weight without pain. Chair alarm assessed and working properly. Chair alarm malfunction before/during event in question. Patient only on SQ heparin. Patient placed back into bed with bed alarm activated.   Fall Prevention Prior to Fall: Patient on chair alarm in chair. Fall bracelet on. Yellow socks on.   On Camera Footage: Patient in chair and RN had left room at 1055am. 1059am patient attempting to get out of chair. *chair alarm did not alarm* 1102 patient standing out of chair. Chair alarm did not go off. Patient ambulated to sink and appeared to get caught on condom catheter and started walking backwards and tripped over catheter. At this time he fell back onto his buttock and his neck and head did not hit the floor. Patient found by RN on floor at this time. (time on camera different than time on room clock/epic accounting for time differences)  MD notified and no new orders received at this time. MD stated patient okay to discharge.

## 2015-11-20 NOTE — Clinical Social Work Placement (Signed)
   CLINICAL SOCIAL WORK PLACEMENT  NOTE  Date:  11/20/2015  Patient Details  Name: Victor Clarke MRN: [id number] Date of Birth: 14-Jan-1929  Clinical Social Work is seeking post-discharge placement for this patient at the [place] level of care (*CSW will initial, date and re-position this form in  chart as items are completed):  Yes   Patient/family provided with [facility] Work Department's list of facilities offering this level of care within the geographic area requested by the patient (or if unable, by the patient's family).  Yes   Patient/family informed of their freedom to choose among providers that offer the needed level of care, that participate in Medicare, Medicaid or managed care program needed by the patient, have an available bed and are willing to accept the patient.  Yes   Patient/family informed of [facility]'s ownership interest in [facility] and [facility], as well as of the fact that they are under no obligation to receive care at these facilities.  PASRR submitted to EDS on       PASRR number received on       Existing PASRR number confirmed on 11/18/15     FL2 transmitted to all facilities in geographic area requested by pt/family on 11/18/15     FL2 transmitted to all facilities within larger geographic area on       Patient informed that his/her managed care company has contracts with or will negotiate with certain facilities, including the following:        Yes   Patient/family informed of bed offers received.  Patient chooses bed at [facility]     Physician recommends and patient chooses bed at      Patient to be transferred to [facility] on 11/20/15.  Patient to be transferred to facility by [name]     Patient family notified on 11/20/15 of transfer.  Name of family member notified:  Niece, [phone]     PHYSICIAN Please prepare prescriptions     Additional  Comment:    _______________________________________________ Benard Halsted, [place] 11/20/2015, 12:31 PM

## 2015-11-20 NOTE — Discharge Summary (Signed)
Victor Clarke, is a 82 y.o. male  DOB [birth year]  MRN [id number].  Admission date:  11/07/2015  Admitting Physician  Javier Glazier, MD  Discharge Date:  11/20/2015   Primary MD  Aretta Nip, MD  Recommendations for primary care physician for things to follow:   Remove front scalp sutures on 11/26/2015.  Check CBC, CMP in a week.   Admission Diagnosis  Lactic acid acidosis [E87.2] UTI (lower urinary tract infection) [N39.0] Alcohol intoxication, uncomplicated ([place]) 123456 Altered mental status, unspecified altered mental status type [R41.82] Fall (on) (from) other stairs and steps, initial encounter [W10.8XXA]   Discharge Diagnosis  Lactic acid acidosis [E87.2] UTI (lower urinary tract infection) [N39.0] Alcohol intoxication, uncomplicated ([place]) 123456 Altered mental status, unspecified altered mental status type [R41.82] Fall (on) (from) other stairs and steps, initial encounter [W10.8XXA]     Principal Problem:   Acute encephalopathy Active Problems:   Hypertension   Hypercholesteremia   Lactic acidosis   Fall from steps   Anemia   Alcohol abuse with intoxication ([place])   Thrombocytopenia ([place])   Pyuria   Acute respiratory failure with hypercapnia (HCC)   Acute respiratory failure (HCC)   Alcohol intoxication ([place])   Altered mental state   Acute respiratory failure with hypoxemia (HCC)   Malnutrition of moderate degree      Past Medical History  Diagnosis Date  . Hypertension   . Hypercholesteremia   . Gout   . COPD (chronic obstructive pulmonary disease) ([place])   . Detached retina     a. s/p surgical correction on the right.  . Prostate cancer ([place])     a. s/p TURP.  Marland Kitchen Ectopic atrial tachycardia ([place])     a. 08/2014 Echo: EF 55-60%, mildly dil LA.  . Parkinson's disease  ([place])   . Left inguinal hernia     a. s/p repair ~ 10 yrs ago.    Past Surgical History  Procedure Laterality Date  . Colonoscopy w/ polypectomy  2001, 2006  . Hernia repair    . Prostate surgery         HPI  :    82 year old male with PMH as below, which includes HTN, HLD, COPD, and Parkinson's. He also has a history of ETOH abuse and has presented to ED several times recently for fall and was found to be intoxicated each time. 12/20 he again presented to [facility] ED s/p falling down steps at home and was found down for unknown time. He was altered and intoxicated in ED. He had lacerations to the front of his scalp and forehead. UA was suspicious for UTI and he was started on ABX. CT head/cspine was obtained and showed no acute intracranial issues. Minimally displaced nasal bone fracture. No C-Spine fracture. He was improved to the hospitalis team and was placed on CIWA protocol. His encephalopathy was much improved the following day 12/21. 12/22 he was becoming more lethargic and was moved to SDU for closer monitoring, however he was still alert and  able to follow commands. As the night went on his mental status progressively diminished and his respiratory status decline. PCCM consulted for further evaluation.   He was found to be extremely delirious and unable to maintain his airway and was extremely lethargic along with evidence of hypoxemia, he was intubated and admitted to critical care ICU on 11/10/2015, thereafter he was extubated on 11/13/2015. He was stabilized and transferred to hospitalist service under my care on 11/18/2015     Hospital Course:     1. Acute respiratory failure - acquiring intubation for 3 days most likely due to lethargy and inability to maintenance airway, has been successfully extubated, now on room air. Has underlying COPD without any evidence of exacerbation. Continue supportive care with as needed nebulizer treatments, stable on room air. Has been seen by speech  and cleared for any ongoing aspiration, he is currently on regular diet.   2. History of alcohol abuse, intoxication, withdrawal and DTs. Counseled to quit. Now off [place] protocol, will place on folic acid thiamine and monitor. Increase activity. Will require placement.   3. Dyslipidemia. On statin continue.   4. History of gout. On allopurinol continue.   5. Sepsis secondary to UTI. Has been treated in ICU has completed antibiotics. Sepsis physiology has resolved.   6. HX of Parkinson's. Continue Sinemet.   7. Thrombocytopenia. Secondary to alcohol abuse. Monitor no evidence of bleeding.   8. Mechanical fall after alcohol intoxication at home. History of scalp laceration and nasal fracture. Supportive care.   9. Essential hypertension. Currently on Cardizem and stable.   10. Underlying dementia and delirium. Supportive care. Will require SNF placement.   11. Scalp laceration. Healing well, remove sutures on 11/26/2015.   Discharge Condition: Fair  Follow UP  Follow-up Information    Follow up with Aretta Nip, MD. Schedule an appointment as soon as possible for a visit in 1 week.   Specialty:  Family Medicine   Contact information:   [place] [address] [phone]        Consults obtained -  PCCM  Diet and Activity recommendation: See Discharge Instructions below  Discharge Instructions       Discharge Instructions    Discharge instructions    Complete by:  As directed   Follow with Primary MD Aretta Nip, MD in 7 days   Get CBC, CMP, 2 view Chest X ray checked  by Primary MD next visit.    Activity: As tolerated with Full fall precautions use walker/cane & assistance as needed   Disposition SNF   Diet:   Heart Healthy  with feeding assistance and aspiration precautions.  For Heart failure patients - Check your Weight same time everyday, if you gain over 2 pounds, or you develop in leg swelling, experience  more shortness of breath or chest pain, call your Primary MD immediately. Follow Cardiac Low Salt Diet and 1.5 lit/day fluid restriction.   On your next visit with your primary care physician please Get Medicines reviewed and adjusted.   Please request your Prim.MD to go over all Hospital Tests and Procedure/Radiological results at the follow up, please get all Hospital records sent to your Prim MD by signing hospital release before you go home.   If you experience worsening of your admission symptoms, develop shortness of breath, life threatening emergency, suicidal or homicidal thoughts you must seek medical attention immediately by calling 911 or calling your MD immediately  if symptoms less severe.  You Must read complete instructions/literature along with all the possible adverse reactions/side effects for all the Medicines you take and that have been prescribed to you. Take any new Medicines after you have completely understood and accpet all the possible adverse reactions/side effects.   Do not drive, operating heavy machinery, perform activities at heights, swimming or participation in water activities or provide baby sitting services if your were admitted for syncope or siezures until you have seen by Primary MD or a Neurologist and advised to do so again.  Do not drive when taking Pain medications.    Do not take more than prescribed Pain, Sleep and Anxiety Medications  Special Instructions: If you have smoked or chewed Tobacco  in the last 2 yrs please stop smoking, stop any regular Alcohol  and or any Recreational drug use.  Wear Seat belts while driving.   Please note  You were cared for by a hospitalist during your hospital stay. If you have any questions about your discharge medications or the care you received while you were in the hospital after you are discharged, you can call the unit and asked to speak with the hospitalist on call if the hospitalist that took care of  you is not available. Once you are discharged, your primary care physician will handle any further medical issues. Please note that NO REFILLS for any discharge medications will be authorized once you are discharged, as it is imperative that you return to your primary care physician (or establish a relationship with a primary care physician if you do not have one) for your aftercare needs so that they can reassess your need for medications and monitor your lab values.     Increase activity slowly    Complete by:  As directed              Discharge Medications       Medication List    STOP taking these medications        meclizine 25 MG tablet  Commonly known as:  ANTIVERT      TAKE these medications        allopurinol 100 MG tablet  Commonly known as:  ZYLOPRIM  Take 100 mg by mouth daily.     aspirin EC 81 MG tablet  Take 81 mg by mouth daily.     atorvastatin 40 MG tablet  Commonly known as:  LIPITOR  Take 40 mg by mouth daily.     carbidopa-levodopa 25-100 MG tablet  Commonly known as:  SINEMET IR  Take 0.5 tablets by mouth 3 (three) times daily.     diltiazem 120 MG 24 hr capsule  Commonly known as:  CARDIZEM CD  Take 1 capsule (120 mg total) by mouth daily.     feeding supplement (ENSURE ENLIVE) Liqd  Take 237 mLs by mouth 3 (three) times daily between meals.     Fish Oil 1200 MG Caps  Take 2,400 mg by mouth daily.     folic acid 1 MG tablet  Commonly known as:  FOLVITE  Take 1 tablet (1 mg total) by mouth daily.     lisinopril 5 MG tablet  Commonly known as:  PRINIVIL,ZESTRIL  Take 1 tablet (5 mg total) by mouth daily.     thiamine 100 MG tablet  Take 1 tablet (100 mg total) by mouth daily.        Major procedures and Radiology Reports - PLEASE review detailed and final reports for all details, in brief -  Ct Head Wo Contrast  11/10/2015  CLINICAL DATA:  Altered mental status EXAM: CT HEAD WITHOUT CONTRAST TECHNIQUE: Contiguous axial  images were obtained from the base of the skull through the vertex without intravenous contrast. COMPARISON:  11/07/2015 FINDINGS: Skull and Sinuses:Central forehead swelling without underlying osseous abnormality. Re- demonstrated central nasal arch fracture without progressed displacement. Visualized orbits: Cataract resection on the left and probable glaucoma reservoir on the right. Brain: No evidence of acute infarction, hemorrhage, hydrocephalus, or mass lesion/mass effect. Generalized atrophy with ventriculomegaly. Chronic small vessel disease with mild ischemic gliosis around the lateral ventricles. IMPRESSION: 1. No acute intracranial finding. 2. Atrophy and chronic small vessel disease. 3. Forehead contusion and known nasal arch fracture. Electronically Signed   By: Monte Fantasia M.D.   On: 11/10/2015 05:07   Ct Head Wo Contrast  11/07/2015  CLINICAL DATA:  Passed out today while cooking, now with cut on the nose. EXAM: CT HEAD WITHOUT CONTRAST CT MAXILLOFACIAL WITHOUT CONTRAST CT CERVICAL SPINE WITHOUT CONTRAST TECHNIQUE: Multidetector CT imaging of the head, cervical spine, and maxillofacial structures were performed using the standard protocol without intravenous contrast. Multiplanar CT image reconstructions of the cervical spine and maxillofacial structures were also generated. COMPARISON:  Head CT - 07/31/2015; 06/12/2015; 12/28/2014 FINDINGS: CT HEAD FINDINGS Similar findings of advanced atrophy with sulcal prominence centralized volume loss with commensurate ex vacuo dilatation of the ventricular system. Scattered periventricular hypodensities compatible microvascular ischemic disease. Bilateral basal ganglial calcifications. Given background parenchymal abnormalities, there is no CT evidence of superimposed acute large territory infarct. No intraparenchymal or extra-axial mass or hemorrhage. Unchanged size and configuration of the ventricles and basilar cisterns. No midline shift. CT  MAXILLOFACIAL FINDINGS Evaluation of the facial bones is degraded secondary to patient motion artifact. There is mild soft tissue swelling about the bridge of the nose with potential mildly displaced nasal bone fracture (image 67, series 5). Otherwise, no definite displaced facial fractures given limitation of patient motion. Normal appearance of the bilateral pterygoid plates. Normal appearance of the mandible. The bilateral mandibular condyles are normally located. Normal appearance of the bilateral zygomatic arches. Normal noncontrast appearance of the bilateral orbits and globes. Post left-sided cataract surgery. There is mild rightward nasal septal deviation. There is minimal mucosal thickening within the left maxillary sinus. The remaining paranasal sinuses and mastoid air cells are normally aerated. No air-fluid levels. CT CERVICAL SPINE FINDINGS Evaluation of cervical spine is minimally degraded secondary to patient motion artifact. C1 to the superior endplate of T1 is imaged. There is mild (approximately 3 mm) of presumably degenerative retrolisthesis of C4 upon C5. Otherwise, normal alignment of the cervical spine. No anterolisthesis or retrolisthesis. The bilateral facets are normally aligned. The dens is normally positioned between the lateral masses of C1. Normal atlantodental and atlantoaxial articulations. No fracture or static subluxation of the cervical spine. Cervical vertebral and heights are preserved. Prevertebral soft tissues are normal. Moderate to severe multilevel cervical spine DDD, likely worse at C4-C5 and to a lesser extent, C3-C4 and C5-C6 with disc space height loss, endplate irregularity and sclerosis. There is a suspected partial ankylosis involving the C4-C5 and C5-C6 intervertebral disc spaces. Atherosclerotic plaque within the bilateral carotid bulbs. No bulky cervical lymphadenopathy on this noncontrast examination. Limited visualization lung apices demonstrates biapical bullous  emphysematous change, right greater than left. IMPRESSION: 1. Advanced atrophy and microvascular ischemic disease without acute intracranial process. 2. Suspected minimally displaced nasal bone fracture. No additional facial fractures identified on this motion degraded examination. 3. Motion  degraded examination without evidence of fracture or static subluxation of the cervical spine. 4. Moderate to severe multilevel cervical spine DDD. Electronically Signed   By: Sandi Mariscal M.D.   On: 11/07/2015 17:22   Ct Cervical Spine Wo Contrast  11/07/2015  CLINICAL DATA:  Passed out today while cooking, now with cut on the nose. EXAM: CT HEAD WITHOUT CONTRAST CT MAXILLOFACIAL WITHOUT CONTRAST CT CERVICAL SPINE WITHOUT CONTRAST TECHNIQUE: Multidetector CT imaging of the head, cervical spine, and maxillofacial structures were performed using the standard protocol without intravenous contrast. Multiplanar CT image reconstructions of the cervical spine and maxillofacial structures were also generated. COMPARISON:  Head CT - 07/31/2015; 06/12/2015; 12/28/2014 FINDINGS: CT HEAD FINDINGS Similar findings of advanced atrophy with sulcal prominence centralized volume loss with commensurate ex vacuo dilatation of the ventricular system. Scattered periventricular hypodensities compatible microvascular ischemic disease. Bilateral basal ganglial calcifications. Given background parenchymal abnormalities, there is no CT evidence of superimposed acute large territory infarct. No intraparenchymal or extra-axial mass or hemorrhage. Unchanged size and configuration of the ventricles and basilar cisterns. No midline shift. CT MAXILLOFACIAL FINDINGS Evaluation of the facial bones is degraded secondary to patient motion artifact. There is mild soft tissue swelling about the bridge of the nose with potential mildly displaced nasal bone fracture (image 67, series 5). Otherwise, no definite displaced facial fractures given limitation of patient  motion. Normal appearance of the bilateral pterygoid plates. Normal appearance of the mandible. The bilateral mandibular condyles are normally located. Normal appearance of the bilateral zygomatic arches. Normal noncontrast appearance of the bilateral orbits and globes. Post left-sided cataract surgery. There is mild rightward nasal septal deviation. There is minimal mucosal thickening within the left maxillary sinus. The remaining paranasal sinuses and mastoid air cells are normally aerated. No air-fluid levels. CT CERVICAL SPINE FINDINGS Evaluation of cervical spine is minimally degraded secondary to patient motion artifact. C1 to the superior endplate of T1 is imaged. There is mild (approximately 3 mm) of presumably degenerative retrolisthesis of C4 upon C5. Otherwise, normal alignment of the cervical spine. No anterolisthesis or retrolisthesis. The bilateral facets are normally aligned. The dens is normally positioned between the lateral masses of C1. Normal atlantodental and atlantoaxial articulations. No fracture or static subluxation of the cervical spine. Cervical vertebral and heights are preserved. Prevertebral soft tissues are normal. Moderate to severe multilevel cervical spine DDD, likely worse at C4-C5 and to a lesser extent, C3-C4 and C5-C6 with disc space height loss, endplate irregularity and sclerosis. There is a suspected partial ankylosis involving the C4-C5 and C5-C6 intervertebral disc spaces. Atherosclerotic plaque within the bilateral carotid bulbs. No bulky cervical lymphadenopathy on this noncontrast examination. Limited visualization lung apices demonstrates biapical bullous emphysematous change, right greater than left. IMPRESSION: 1. Advanced atrophy and microvascular ischemic disease without acute intracranial process. 2. Suspected minimally displaced nasal bone fracture. No additional facial fractures identified on this motion degraded examination. 3. Motion degraded examination  without evidence of fracture or static subluxation of the cervical spine. 4. Moderate to severe multilevel cervical spine DDD. Electronically Signed   By: Sandi Mariscal M.D.   On: 11/07/2015 17:22   Dg Chest Port 1 View  11/16/2015  CLINICAL DATA:  Intubated EXAM: PORTABLE CHEST 1 VIEW COMPARISON:  11/15/2015 FINDINGS: Endotracheal tube is 4.4 cm above the carina. Right central line is unchanged. Heart is borderline in size. Improving patchy bilateral airspace opacities. No visible effusions. IMPRESSION: Improving bilateral airspace disease. Electronically Signed   By: Rolm Baptise M.D.  On: 11/16/2015 08:12   Dg Chest Port 1 View  11/15/2015  CLINICAL DATA:  Intubation . EXAM: PORTABLE CHEST 1 VIEW COMPARISON:  11/14/2015 . FINDINGS: Endotracheal tube, NG tube, right IJ line in stable position. Cardiomegaly again noted. Diffuse bilateral pulmonary infiltrates again noted. No interim change. No pleural effusion pneumothorax P IMPRESSION: 1. Lines and tubes stable position. 2. Persistent diffuse bilateral pulmonary infiltrates, no change from prior exam. Findings consistent with bilateral pneumonia and/or pulmonary edema. 3. Stable cardiomegaly. Electronically Signed   By: Marcello Moores  Register   On: 11/15/2015 07:20   Dg Chest Port 1 View  11/14/2015  CLINICAL DATA:  Hypoxia EXAM: PORTABLE CHEST 1 VIEW COMPARISON:  November 13, 2015 FINDINGS: Endotracheal tube tip is 4.4 cm above the carina. Nasogastric tube tip and side port are in the stomach; side-port is seen slightly inferior to the gastroesophageal junction. Central catheter tip is in the superior vena cava slightly proximal to the cavoatrial junction. No pneumothorax. There remains interstitial edema throughout the lungs bilaterally with the greatest concentration of edema in the bases. There is patchy alveolar opacity in the bases, likely alveolar edema, although superimposed pneumonia cannot be excluded. Heart is borderline enlarged with pulmonary  vascularity within normal limits. There are small pleural effusions bilaterally. IMPRESSION: Tube and catheter positions as described without pneumothorax. Slight increase in bibasilar interstitial and patchy alveolar opacity, likely edema, although superimposed pneumonia in the bases cannot be excluded. The overall appearance is most consistent with a degree of congestive heart failure. Electronically Signed   By: Lowella Grip III M.D.   On: 11/14/2015 07:21   Dg Chest Port 1 View  11/13/2015  CLINICAL DATA:  Endotracheal tube and nasogastric tube placement. EXAM: PORTABLE CHEST 1 VIEW COMPARISON:  November 12, 2015 FINDINGS: The heart size and mediastinal contours are within normal limits. There is pulmonary edema, decreased compared to prior exam. Patchy consolidation of left lung base is identified. There is a small left pleural effusion. Endotracheal tube is identified with distal tip 5.9 cm from carina. Right central venous line is noted with distal tip in superior vena cava. Nasogastric tube is identified with distal tip not included on the film but is probably at least in the stomach. The visualized skeletal structures are stable. IMPRESSION: Interval decrease pulmonary edema. Findings suspicious for left lung base pneumonia with left pleural effusion. Endotracheal tube in good position. There is no pneumothorax. Nasogastric tube is identified with distal tip not included but is at least in the stomach. Electronically Signed   By: Abelardo Diesel M.D.   On: 11/13/2015 12:26   Dg Chest Port 1 View  11/12/2015  CLINICAL DATA:  Acute respiratory failure with hypoxia and altered mental status. EXAM: PORTABLE CHEST 1 VIEW COMPARISON:  11/12/2015 FINDINGS: Right internal jugular approach central venous catheter it is stable, tip at the expected location of distal superior vena cava. Cardiomediastinal silhouette is normal. Mediastinal contours appear intact. Atherosclerotic disease of the aorta is seen.  There is no evidence of scratched pleural effusion or pneumothorax. There has been interval development of bilateral with central predominance alveolar and interstitial opacities. Osseous structures are without acute abnormality. Soft tissues are grossly normal. IMPRESSION: Rapid development of bilateral with central predominant interstitial and alveolar airspace opacities, with appearance suggestive of ARDS or flash pulmonary edema. These results were called by telephone at the time of interpretation on 11/12/2015 at 6:52 pm to Cleon Dew, RN, who verbally acknowledged these results and will relayed the results to Dr.  McQuaid. Electronically Signed   By: Fidela Salisbury M.D.   On: 11/12/2015 18:53   Dg Chest Port 1 View  11/12/2015  CLINICAL DATA:  Respiratory failure EXAM: PORTABLE CHEST 1 VIEW COMPARISON:  11/11/2015 FINDINGS: Interval removal of ET tube. The right IJ catheter tip is in the cavoatrial junction. Aortic atherosclerosis. Normal heart size. There is aortic atherosclerosis noted. Stable appearance of bilateral lung opacities. IMPRESSION: 1. No change in bilateral lung opacities. 2. Status post extubation. Electronically Signed   By: Kerby Moors M.D.   On: 11/12/2015 08:36   Dg Chest Port 1 View  11/11/2015  CLINICAL DATA:  Central line placement.  Initial encounter. EXAM: PORTABLE CHEST 1 VIEW COMPARISON:  Chest radiograph performed 11/10/2015 FINDINGS: A right IJ line is noted ending about the mid to distal SVC. The endotracheal tube is seen ending 4-5 cm above the carina. An enteric tube is noted extending below the diaphragm. Patchy bibasilar airspace opacities may reflect mild interstitial edema or pneumonia. A small left pleural effusion is noted. No pneumothorax is seen. The cardiomediastinal silhouette is borderline normal in size. No acute osseous abnormalities are identified. IMPRESSION: 1. Right IJ line noted ending about the mid to distal SVC. 2. Endotracheal tube seen  ending 4-5 cm above the carina. 3. Patchy bibasilar airspace opacities may reflect mild interstitial edema or pneumonia. Small left pleural effusion noted. Electronically Signed   By: Garald Balding M.D.   On: 11/11/2015 01:04   Dg Chest Port 1 View  11/10/2015  CLINICAL DATA:  Intubation. Followup interstitial edema superimposed upon chronic interstitial lung disease. EXAM: PORTABLE CHEST 1 VIEW COMPARISON:  11/09/2015 and earlier. FINDINGS: Endotracheal tube tip in satisfactory position projecting approximately 5 cm above the carina. Diffuse interstitial pulmonary edema superimposed upon chronic interstitial disease, not significantly changed since yesterday. No new pulmonary parenchymal abnormality. IMPRESSION: 1. Endotracheal tube tip in satisfactory position projecting approximately 5 cm above the carina. 2. Stable diffuse interstitial pulmonary edema superimposed upon chronic interstitial lung disease. 3. No new abnormality. Electronically Signed   By: Evangeline Dakin M.D.   On: 11/10/2015 07:52   Dg Chest Port 1 View  11/09/2015  CLINICAL DATA:  Fever. EXAM: PORTABLE CHEST 1 VIEW COMPARISON:  08/05/2015 FINDINGS: Cardiomediastinal silhouette is normal. Mediastinal contours appear intact. There is no evidence of focal airspace consolidation, pleural effusion or pneumothorax. There is diffuse thickening of the interstitial markings, worse than on the prior radiograph. Osseous structures are without acute abnormality. Soft tissues are grossly normal. IMPRESSION: Diffuse thickening of the interstitial markings, which may be seen with chronic interstitial lung disease. An element of interstitial pulmonary edema cannot be excluded. Electronically Signed   By: Fidela Salisbury M.D.   On: 11/09/2015 21:10   Dg Abd Portable 1v  11/10/2015  CLINICAL DATA:  Feeding tube placement EXAM: PORTABLE ABDOMEN - 1 VIEW COMPARISON:  None. FINDINGS: NG tube is in place with the tip in the mid to distal stomach,  and the side port in the mid stomach. Nonobstructive bowel gas pattern. IMPRESSION: Feeding tube tip in the mid to distal stomach. Electronically Signed   By: Rolm Baptise M.D.   On: 11/10/2015 12:13   Dg Swallowing Func-speech Pathology  11/17/2015  Objective Swallowing Evaluation:   Patient Details Name: DAYVIN MANCHEGO MRN: [id number] Date of Birth: 1929/05/07 Today's Date: 11/17/2015 Time: SLP Start Time (ACUTE ONLY): 1117-SLP Stop Time (ACUTE ONLY): 1145 SLP Time Calculation (min) (ACUTE ONLY): 28 min Past Medical History: @PMH @ Past Surgical  History: Past Surgical History Procedure Laterality Date . Colonoscopy w/ polypectomy  2001, 2006 . Hernia repair   . Prostate surgery   HPI: 82 year old male with PMH of HTN, COPD, Parkinson's, ETOH admitted 12/20 with fall down steps, AMS, intoxication, scalp and forehead lacerations. UA was suspicious for UTI. Declined 12/23 requiring intubation. Extubated 12/24. Seen by SLP found to have acute reversible dysphagia, but then reintubated until 12/29. SLP reordered.  No Data Recorded Assessment / Plan / Recommendation CHL IP CLINICAL IMPRESSIONS 11/17/2015 Therapy Diagnosis Mild oral phase dysphagia;Mild pharyngeal phase dysphagia Clinical Impression Pt demonstrates mild oral dysphagia with brief oral holding/pumping prior to initiating the swallow, though there is no premature spill; bolus remains contained. Oropharyngeal function is characterized by mild base of tongue weakness resulting in residuals that penetrate the airway post swallow with nectar and thin liquids. Pt clears throat either immediately or a few swallows later, clearing trace penetrate. No aspiration observed. Pt may initiate a regular diet and thin liquids with basic aspiration precautions. Pt should be encouraged to clear his throat. SLP will f/u for tolerance.  Impact on safety and function Mild aspiration risk   CHL IP TREATMENT RECOMMENDATION 11/17/2015 Treatment Recommendations Therapy as  outlined in treatment plan below   Prognosis 11/17/2015 Prognosis for Safe Diet Advancement Good Barriers to Reach Goals Cognitive deficits Barriers/Prognosis Comment -- CHL IP DIET RECOMMENDATION 11/17/2015 SLP Diet Recommendations Regular solids;Thin liquid Liquid Administration via Cup;Straw Medication Administration Whole meds with liquid Compensations Minimize environmental distractions;Slow rate;Small sips/bites Postural Changes Remain semi-upright after after feeds/meals (Comment)   CHL IP OTHER RECOMMENDATIONS 11/17/2015 Recommended Consults -- Oral Care Recommendations Oral care BID Other Recommendations --   CHL IP FOLLOW UP RECOMMENDATIONS 11/17/2015 Follow up Recommendations Skilled Nursing facility   [facility] IP FREQUENCY AND DURATION 11/17/2015 Speech Therapy Frequency (ACUTE ONLY) min 2x/week Treatment Duration 1 week      CHL IP ORAL PHASE 11/17/2015 Oral Phase Impaired Oral - Pudding Teaspoon -- Oral - Pudding Cup -- Oral - Honey Teaspoon -- Oral - Honey Cup -- Oral - Nectar Teaspoon -- Oral - Nectar Cup Delayed oral transit;Lingual pumping;Holding of bolus Oral - Nectar Straw -- Oral - Thin Teaspoon -- Oral - Thin Cup Delayed oral transit;Lingual pumping;Holding of bolus Oral - Thin Straw Delayed oral transit;Lingual pumping;Holding of bolus Oral - Puree Delayed oral transit;Lingual pumping;Holding of bolus Oral - Mech Soft -- Oral - Regular Delayed oral transit;Lingual pumping;Holding of bolus Oral - Multi-Consistency -- Oral - Pill Delayed oral transit;Lingual pumping;Holding of bolus Oral Phase - Comment --  CHL IP PHARYNGEAL PHASE 11/17/2015 Pharyngeal Phase Impaired Pharyngeal- Pudding Teaspoon -- Pharyngeal -- Pharyngeal- Pudding Cup -- Pharyngeal -- Pharyngeal- Honey Teaspoon -- Pharyngeal -- Pharyngeal- Honey Cup -- Pharyngeal -- Pharyngeal- Nectar Teaspoon -- Pharyngeal -- Pharyngeal- Nectar Cup Pharyngeal residue - valleculae;Penetration/Apiration after swallow;Reduced tongue base retraction  Pharyngeal Material enters airway, remains ABOVE vocal cords and not ejected out Pharyngeal- Nectar Straw -- Pharyngeal -- Pharyngeal- Thin Teaspoon -- Pharyngeal -- Pharyngeal- Thin Cup Pharyngeal residue - valleculae;Penetration/Apiration after swallow;Reduced tongue base retraction Pharyngeal Material enters airway, CONTACTS cords and then ejected out Pharyngeal- Thin Straw Pharyngeal residue - valleculae;Penetration/Apiration after swallow;Reduced tongue base retraction Pharyngeal Material enters airway, CONTACTS cords and then ejected out Pharyngeal- Puree Reduced epiglottic inversion;Reduced tongue base retraction Pharyngeal -- Pharyngeal- Mechanical Soft -- Pharyngeal -- Pharyngeal- Regular Reduced epiglottic inversion;Reduced tongue base retraction Pharyngeal -- Pharyngeal- Multi-consistency -- Pharyngeal -- Pharyngeal- Pill -- Pharyngeal -- Pharyngeal Comment --  No flowsheet data found. Herbie [place], [place] CCC-SLP [phone] Othelia Pulling Katherene Ponto 11/17/2015, 12:37 PM              Ct Maxillofacial Wo Cm  11/07/2015  CLINICAL DATA:  Passed out today while cooking, now with cut on the nose. EXAM: CT HEAD WITHOUT CONTRAST CT MAXILLOFACIAL WITHOUT CONTRAST CT CERVICAL SPINE WITHOUT CONTRAST TECHNIQUE: Multidetector CT imaging of the head, cervical spine, and maxillofacial structures were performed using the standard protocol without intravenous contrast. Multiplanar CT image reconstructions of the cervical spine and maxillofacial structures were also generated. COMPARISON:  Head CT - 07/31/2015; 06/12/2015; 12/28/2014 FINDINGS: CT HEAD FINDINGS Similar findings of advanced atrophy with sulcal prominence centralized volume loss with commensurate ex vacuo dilatation of the ventricular system. Scattered periventricular hypodensities compatible microvascular ischemic disease. Bilateral basal ganglial calcifications. Given background parenchymal abnormalities, there is no CT evidence of superimposed acute large  territory infarct. No intraparenchymal or extra-axial mass or hemorrhage. Unchanged size and configuration of the ventricles and basilar cisterns. No midline shift. CT MAXILLOFACIAL FINDINGS Evaluation of the facial bones is degraded secondary to patient motion artifact. There is mild soft tissue swelling about the bridge of the nose with potential mildly displaced nasal bone fracture (image 67, series 5). Otherwise, no definite displaced facial fractures given limitation of patient motion. Normal appearance of the bilateral pterygoid plates. Normal appearance of the mandible. The bilateral mandibular condyles are normally located. Normal appearance of the bilateral zygomatic arches. Normal noncontrast appearance of the bilateral orbits and globes. Post left-sided cataract surgery. There is mild rightward nasal septal deviation. There is minimal mucosal thickening within the left maxillary sinus. The remaining paranasal sinuses and mastoid air cells are normally aerated. No air-fluid levels. CT CERVICAL SPINE FINDINGS Evaluation of cervical spine is minimally degraded secondary to patient motion artifact. C1 to the superior endplate of T1 is imaged. There is mild (approximately 3 mm) of presumably degenerative retrolisthesis of C4 upon C5. Otherwise, normal alignment of the cervical spine. No anterolisthesis or retrolisthesis. The bilateral facets are normally aligned. The dens is normally positioned between the lateral masses of C1. Normal atlantodental and atlantoaxial articulations. No fracture or static subluxation of the cervical spine. Cervical vertebral and heights are preserved. Prevertebral soft tissues are normal. Moderate to severe multilevel cervical spine DDD, likely worse at C4-C5 and to a lesser extent, C3-C4 and C5-C6 with disc space height loss, endplate irregularity and sclerosis. There is a suspected partial ankylosis involving the C4-C5 and C5-C6 intervertebral disc spaces. Atherosclerotic plaque  within the bilateral carotid bulbs. No bulky cervical lymphadenopathy on this noncontrast examination. Limited visualization lung apices demonstrates biapical bullous emphysematous change, right greater than left. IMPRESSION: 1. Advanced atrophy and microvascular ischemic disease without acute intracranial process. 2. Suspected minimally displaced nasal bone fracture. No additional facial fractures identified on this motion degraded examination. 3. Motion degraded examination without evidence of fracture or static subluxation of the cervical spine. 4. Moderate to severe multilevel cervical spine DDD. Electronically Signed   By: Sandi Mariscal M.D.   On: 11/07/2015 17:22    Micro Results      Recent Results (from the past 240 hour(s))  Culture, respiratory (NON-Expectorated)     Status: None   Collection Time: 11/11/15  3:37 AM  Result Value Ref Range Status   Specimen Description TRACHEAL ASPIRATE  Final   Special Requests Normal  Final   Gram Stain   Final    MODERATE WBC MODERATE SQUAMOUS EPITHELIAL CELLS PRESENT NO ORGANISMS  SEEN Performed at Home   Final    NORMAL OROPHARYNGEAL FLORA Performed at [facility]    Report Status 11/14/2015 FINAL  Final       Today   Subjective    Waldon Merl today has no headache,no chest abdominal pain,no new weakness tingling or numbness, feels much better.   Objective   Blood pressure 128/73, pulse 66, temperature 97.7 F (36.5 C), temperature source Oral, resp. rate 18, height 5\' 9"  (1.753 m), weight 58.8 kg (129 lb 10.1 oz), SpO2 100 %.   Intake/Output Summary (Last 24 hours) at 11/20/15 0846 Last data filed at 11/19/15 1843  Gross per 24 hour  Intake    840 ml  Output    150 ml  Net    690 ml    Exam Awake Oriented x 1, No new F.N deficits, Normal affect .AT,PERRAL, laceration site on the frontal scalp almost healed, sutures intact Supple Neck,No JVD, No cervical lymphadenopathy  appriciated.  Symmetrical Chest wall movement, Good air movement bilaterally, CTAB RRR,No Gallops,Rubs or new Murmurs, No Parasternal Heave +ve B.Sounds, Abd Soft, Non tender, No organomegaly appriciated, No rebound -guarding or rigidity. No Cyanosis, Clubbing or edema, No new Rash or bruise   Data Review   CBC w Diff: Lab Results  Component Value Date   WBC 5.6 11/18/2015   HGB 11.6* 11/18/2015   HCT 34.2* 11/18/2015   PLT 376 11/18/2015   LYMPHOPCT 6 11/11/2015   MONOPCT 4 11/11/2015   EOSPCT 0 11/11/2015   BASOPCT 0 11/11/2015    CMP: Lab Results  Component Value Date   NA 144 11/18/2015   K 3.8 11/18/2015   CL 106 11/18/2015   CO2 27 11/18/2015   BUN 21* 11/18/2015   CREATININE 0.88 11/18/2015   PROT 6.5 11/10/2015   ALBUMIN 2.2* 11/11/2015   BILITOT QUANTITY NOT SUFFICIENT, UNABLE TO PERFORM TEST 11/10/2015   ALKPHOS 47 11/10/2015   AST 40 11/10/2015   ALT 16* 11/10/2015  .   Total Time in preparing paper work, data evaluation and todays exam - 35 minutes  Thurnell Lose M.D on 11/20/2015 at 8:46 AM  [facility]   Office  [phone]

## 2015-11-20 NOTE — Progress Notes (Signed)
Patient will DC to: [facility] Anticipated DC date: 11/20/15 Family notified: Niece Transport by: [name] 2:30pm  CSW signing off.  Cedric Fishman, [place] Social Worker [phone]

## 2015-11-20 NOTE — Discharge Instructions (Signed)
Follow with Primary MD Aretta Nip, MD in 7 days   Get CBC, CMP, 2 view Chest X ray checked  by Primary MD next visit.    Activity: As tolerated with Full fall precautions use walker/cane & assistance as needed   Disposition SNF   Diet:   Heart Healthy  with feeding assistance and aspiration precautions.  For Heart failure patients - Check your Weight same time everyday, if you gain over 2 pounds, or you develop in leg swelling, experience more shortness of breath or chest pain, call your Primary MD immediately. Follow Cardiac Low Salt Diet and 1.5 lit/day fluid restriction.   On your next visit with your primary care physician please Get Medicines reviewed and adjusted.   Please request your Prim.MD to go over all Hospital Tests and Procedure/Radiological results at the follow up, please get all Hospital records sent to your Prim MD by signing hospital release before you go home.   If you experience worsening of your admission symptoms, develop shortness of breath, life threatening emergency, suicidal or homicidal thoughts you must seek medical attention immediately by calling 911 or calling your MD immediately  if symptoms less severe.  You Must read complete instructions/literature along with all the possible adverse reactions/side effects for all the Medicines you take and that have been prescribed to you. Take any new Medicines after you have completely understood and accpet all the possible adverse reactions/side effects.   Do not drive, operating heavy machinery, perform activities at heights, swimming or participation in water activities or provide baby sitting services if your were admitted for syncope or siezures until you have seen by Primary MD or a Neurologist and advised to do so again.  Do not drive when taking Pain medications.    Do not take more than prescribed Pain, Sleep and Anxiety Medications  Special Instructions: If you have smoked or chewed Tobacco  in  the last 2 yrs please stop smoking, stop any regular Alcohol  and or any Recreational drug use.  Wear Seat belts while driving.   Please note  You were cared for by a hospitalist during your hospital stay. If you have any questions about your discharge medications or the care you received while you were in the hospital after you are discharged, you can call the unit and asked to speak with the hospitalist on call if the hospitalist that took care of you is not available. Once you are discharged, your primary care physician will handle any further medical issues. Please note that NO REFILLS for any discharge medications will be authorized once you are discharged, as it is imperative that you return to your primary care physician (or establish a relationship with a primary care physician if you do not have one) for your aftercare needs so that they can reassess your need for medications and monitor your lab values.

## 2015-11-20 NOTE — Care Management Note (Signed)
Case Management Note  Patient Details  Name: DONOVON MIDDLEMISS MRN: [id number] Date of Birth: 19-Sep-1929  Subjective/Objective:                 Patient admitted with Acute confusion.    Action/Plan:  DC to SNF today.  Expected Discharge Date:                  Expected Discharge Plan:  Skilled Nursing Facility  In-House Referral:  Clinical Social Work  Discharge planning Services  CM Consult  Post Acute Care Choice:    Choice offered to:     DME Arranged:    DME Agency:     HH Arranged:    [place] Agency:     Status of Service:  Completed, signed off  Medicare Important Message Given:    Date Medicare IM Given:    Medicare IM give by:    Date Additional Medicare IM Given:    Additional Medicare Important Message give by:     If discussed at [place] of Stay Meetings, dates discussed:    Additional Comments:  Carles Collet, RN 11/20/2015, 11:07 AM

## 2015-11-21 ENCOUNTER — Non-Acute Institutional Stay (SKILLED_NURSING_FACILITY): Payer: Medicare Other | Admitting: Internal Medicine

## 2015-11-21 ENCOUNTER — Encounter: Payer: Self-pay | Admitting: Internal Medicine

## 2015-11-21 DIAGNOSIS — G20 Parkinson's disease: Secondary | ICD-10-CM

## 2015-11-21 DIAGNOSIS — E440 Moderate protein-calorie malnutrition: Secondary | ICD-10-CM | POA: Diagnosis not present

## 2015-11-21 DIAGNOSIS — D539 Nutritional anemia, unspecified: Secondary | ICD-10-CM | POA: Diagnosis not present

## 2015-11-21 DIAGNOSIS — F1010 Alcohol abuse, uncomplicated: Secondary | ICD-10-CM

## 2015-11-21 DIAGNOSIS — D696 Thrombocytopenia, unspecified: Secondary | ICD-10-CM | POA: Diagnosis not present

## 2015-11-21 DIAGNOSIS — I10 Essential (primary) hypertension: Secondary | ICD-10-CM

## 2015-11-21 DIAGNOSIS — W108XXA Fall (on) (from) other stairs and steps, initial encounter: Secondary | ICD-10-CM

## 2015-11-21 DIAGNOSIS — E785 Hyperlipidemia, unspecified: Secondary | ICD-10-CM

## 2015-11-21 DIAGNOSIS — I480 Paroxysmal atrial fibrillation: Secondary | ICD-10-CM

## 2015-11-21 NOTE — Progress Notes (Signed)
Patient ID: Victor Clarke, male   DOB: [birth year], 82 y.o.   MRN: [id number]    HISTORY AND PHYSICAL   DATE: 11/21/15  Location:  [facility]    Place of Service: SNF [phone])   Extended Emergency Contact Information Primary Emergency Contact: Westley Gambles, [place] Phone: [phone] Relation: Brother Secondary Emergency Contact: Hassel N[place] Phone: [phone] Mobile Phone: [phone] Relation: Niece  Advanced Directive information  FULL CODE  Chief Complaint  Patient presents with  . New Admit To SNF    HPI:  82 yo male seen today as a new admission into SNF following hospital stay for fall down stairs with continuous Etoh abuse, nasal fx, Klebsiella pneumoniae UTI, altered mental status, acute respiratory failure. He req'd intubation x 3 days as he was unable to maintain airway. He was given Etoh cessation counseling and placed on folate, thiamine. Scalp laceration sutured and will need removal 1/8th. UTI tx prior to d/c  He has no c/o today. No nursing issues. No falls. Pain controlled. He is a poor historian due to memory issues. Hx obtained from chart  HTN - BP controlled lisinopril and cardizem CD. Takes ASA daily  Hyperlipidemia - stable on lipitor. No myalgias. He takes fish oil BID  Anemia - probably due to nutritional. Takes folate and thiamine  Etoh abuse - ongoing. He was counseled in hospital on cessation. Takes thiamine, folate supplements. He takes nutritional supplements. plts 376K at d/c (has been as low as 90s)  Gout - no acute attacks. Takes allopurinol for maintenance  COPD - stable off meds. No recent exacerbations  Parkinson's disease - stable on sinemet IR  Hx atrial tachycardia - rate controlled on cardizem CD. Takes ASA daily  Past Medical History  Diagnosis Date  . Hypertension   . Hypercholesteremia   . Gout   . COPD (chronic  obstructive pulmonary disease) ([place])   . Detached retina     a. s/p surgical correction on the right.  . Prostate cancer ([place])     a. s/p TURP.  Marland Kitchen Ectopic atrial tachycardia ([place])     a. 08/2014 Echo: EF 55-60%, mildly dil LA.  . Parkinson's disease ([place])   . Left inguinal hernia     a. s/p repair ~ 10 yrs ago.    Past Surgical History  Procedure Laterality Date  . Colonoscopy w/ polypectomy  2001, 2006  . Hernia repair    . Prostate surgery      Patient Care Team: Aretta Nip, MD as PCP - General (Family Medicine)  Social History   Social History  . Marital Status: Widowed    Spouse Name: N/A  . Number of Children: 0  . Years of Education: College   Occupational History  . Retired Other   Social History Main Topics  . Smoking status: Former Smoker -- 2.00 packs/day for 22 years    Types: Cigarettes    Quit date: 11/18/1977  . Smokeless tobacco: Never Used  . Alcohol Use: 0.6 oz/week    1 Standard drinks or equivalent per week     Comment: 4 oz of gin daily.  . Drug Use: No  . Sexual Activity: No   Other Topics Concern  . Not on file   Social History Narrative   Patient lives at home alone.   Caffeine Use: 1 cup daily  reports that he quit smoking about 38 years ago. His smoking use included Cigarettes. He has a 44 pack-year smoking history. He has never used smokeless tobacco. He reports that he drinks about 0.6 oz of alcohol per week. He reports that he does not use illicit drugs.  Family History  Problem Relation Age of Onset  . Stroke Father     died @ 70.  Marland Kitchen Heart attack Mother     died @ 97  . Stroke Brother     died in his 76's  . Heart attack Brother     died in his 43's   Family Status  Relation Status Death Age  . Father Deceased 20  . Mother Deceased 70    Immunization History  Administered Date(s) Administered  . Influenza-Unspecified 08/09/2015  . PPD Test 08/11/2015  . Tdap 11/07/2015    No Known  Allergies  Medications: Patient's Medications  New Prescriptions   No medications on file  Previous Medications   ALLOPURINOL (ZYLOPRIM) 100 MG TABLET    Take 100 mg by mouth daily.    ASPIRIN EC 81 MG TABLET    Take 81 mg by mouth daily.   ATORVASTATIN (LIPITOR) 40 MG TABLET    Take 40 mg by mouth daily.   CARBIDOPA-LEVODOPA (SINEMET IR) 25-100 MG PER TABLET    Take 0.5 tablets by mouth 3 (three) times daily.   DILTIAZEM (CARDIZEM CD) 120 MG 24 HR CAPSULE    Take 1 capsule (120 mg total) by mouth daily.   FEEDING SUPPLEMENT, ENSURE ENLIVE, (ENSURE ENLIVE) LIQD    Take 237 mLs by mouth 3 (three) times daily between meals.   FOLIC ACID (FOLVITE) 1 MG TABLET    Take 1 tablet (1 mg total) by mouth daily.   LISINOPRIL (PRINIVIL,ZESTRIL) 5 MG TABLET    Take 1 tablet (5 mg total) by mouth daily.   OMEGA-3 FATTY ACIDS (FISH OIL) 1200 MG CAPS    Take 2,400 mg by mouth daily.   THIAMINE 100 MG TABLET    Take 1 tablet (100 mg total) by mouth daily.  Modified Medications   No medications on file  Discontinued Medications   No medications on file    Review of Systems  Unable to perform ROS: Other    Filed Vitals:   11/21/15 1722  BP: 114/52  Pulse: 69  Temp: 99.5 F (37.5 C)  Weight: 130 lb (58.968 kg)  SpO2: 98%   Body mass index is 19.19 kg/(m^2).  Physical Exam  Constitutional: He appears well-developed.  Lying in bed, frail appearing in NAD  HENT:  Nose: Nasal deformity present.  Mouth/Throat: Oropharynx is clear and moist.  Eyes: Pupils are equal, round, and reactive to light. No scleral icterus.  Neck: Neck supple. Carotid bruit is not present. No thyromegaly present.  Cardiovascular: Normal rate, regular rhythm and intact distal pulses.  Exam reveals no gallop and no friction rub.   Murmur (2/6 SEM) heard. no distal LE swelling. No calf TTP  Pulmonary/Chest: Effort normal and breath sounds normal. He has no wheezes. He has no rales. He exhibits no tenderness.  Abdominal:  Soft. Bowel sounds are normal. He exhibits no distension, no abdominal bruit, no pulsatile midline mass and no mass. There is no tenderness. There is no rebound and no guarding.  Lymphadenopathy:    He has no cervical adenopathy.  Neurological: He is alert.  Skin: Skin is warm and dry. No rash noted.  Forehead laceration sutures intact without signs  of secondary infection  Psychiatric: He has a normal mood and affect. His behavior is normal.     Labs reviewed: Admission on 11/07/2015, Discharged on 11/20/2015  No results displayed because visit has over 200 results.     CBC Latest Ref Rng 11/18/2015 11/17/2015 11/16/2015  WBC 4.0 - 10.5 K/uL 5.6 7.2 6.5  Hemoglobin 13.0 - 17.0 g/dL 11.6(L) 10.7(L) 10.8(L)  Hematocrit 39.0 - 52.0 % 34.2(L) 33.1(L) 33.4(L)  Platelets 150 - 400 K/uL 376 291 278    CMP Latest Ref Rng 11/18/2015 11/17/2015 11/16/2015  Glucose 65 - 99 mg/dL 130(H) 140(H) 143(H)  BUN 6 - 20 mg/dL 21(H) 27(H) 22(H)  Creatinine 0.61 - 1.24 mg/dL 0.88 0.95 1.09  Sodium 135 - 145 mmol/L 144 144 144  Potassium 3.5 - 5.1 mmol/L 3.8 3.9 4.5  Chloride 101 - 111 mmol/L 106 105 104  CO2 22 - 32 mmol/L 27 29 28   Calcium 8.9 - 10.3 mg/dL 9.3 8.9 8.9      Ct Head Wo Contrast  11/10/2015  CLINICAL DATA:  Altered mental status EXAM: CT HEAD WITHOUT CONTRAST TECHNIQUE: Contiguous axial images were obtained from the base of the skull through the vertex without intravenous contrast. COMPARISON:  11/07/2015 FINDINGS: Skull and Sinuses:Central forehead swelling without underlying osseous abnormality. Re- demonstrated central nasal arch fracture without progressed displacement. Visualized orbits: Cataract resection on the left and probable glaucoma reservoir on the right. Brain: No evidence of acute infarction, hemorrhage, hydrocephalus, or mass lesion/mass effect. Generalized atrophy with ventriculomegaly. Chronic small vessel disease with mild ischemic gliosis around the lateral  ventricles. IMPRESSION: 1. No acute intracranial finding. 2. Atrophy and chronic small vessel disease. 3. Forehead contusion and known nasal arch fracture. Electronically Signed   By: Monte Fantasia M.D.   On: 11/10/2015 05:07   Ct Head Wo Contrast  11/07/2015  CLINICAL DATA:  Passed out today while cooking, now with cut on the nose. EXAM: CT HEAD WITHOUT CONTRAST CT MAXILLOFACIAL WITHOUT CONTRAST CT CERVICAL SPINE WITHOUT CONTRAST TECHNIQUE: Multidetector CT imaging of the head, cervical spine, and maxillofacial structures were performed using the standard protocol without intravenous contrast. Multiplanar CT image reconstructions of the cervical spine and maxillofacial structures were also generated. COMPARISON:  Head CT - 07/31/2015; 06/12/2015; 12/28/2014 FINDINGS: CT HEAD FINDINGS Similar findings of advanced atrophy with sulcal prominence centralized volume loss with commensurate ex vacuo dilatation of the ventricular system. Scattered periventricular hypodensities compatible microvascular ischemic disease. Bilateral basal ganglial calcifications. Given background parenchymal abnormalities, there is no CT evidence of superimposed acute large territory infarct. No intraparenchymal or extra-axial mass or hemorrhage. Unchanged size and configuration of the ventricles and basilar cisterns. No midline shift. CT MAXILLOFACIAL FINDINGS Evaluation of the facial bones is degraded secondary to patient motion artifact. There is mild soft tissue swelling about the bridge of the nose with potential mildly displaced nasal bone fracture (image 67, series 5). Otherwise, no definite displaced facial fractures given limitation of patient motion. Normal appearance of the bilateral pterygoid plates. Normal appearance of the mandible. The bilateral mandibular condyles are normally located. Normal appearance of the bilateral zygomatic arches. Normal noncontrast appearance of the bilateral orbits and globes. Post left-sided  cataract surgery. There is mild rightward nasal septal deviation. There is minimal mucosal thickening within the left maxillary sinus. The remaining paranasal sinuses and mastoid air cells are normally aerated. No air-fluid levels. CT CERVICAL SPINE FINDINGS Evaluation of cervical spine is minimally degraded secondary to patient motion artifact. C1 to the superior endplate of  T1 is imaged. There is mild (approximately 3 mm) of presumably degenerative retrolisthesis of C4 upon C5. Otherwise, normal alignment of the cervical spine. No anterolisthesis or retrolisthesis. The bilateral facets are normally aligned. The dens is normally positioned between the lateral masses of C1. Normal atlantodental and atlantoaxial articulations. No fracture or static subluxation of the cervical spine. Cervical vertebral and heights are preserved. Prevertebral soft tissues are normal. Moderate to severe multilevel cervical spine DDD, likely worse at C4-C5 and to a lesser extent, C3-C4 and C5-C6 with disc space height loss, endplate irregularity and sclerosis. There is a suspected partial ankylosis involving the C4-C5 and C5-C6 intervertebral disc spaces. Atherosclerotic plaque within the bilateral carotid bulbs. No bulky cervical lymphadenopathy on this noncontrast examination. Limited visualization lung apices demonstrates biapical bullous emphysematous change, right greater than left. IMPRESSION: 1. Advanced atrophy and microvascular ischemic disease without acute intracranial process. 2. Suspected minimally displaced nasal bone fracture. No additional facial fractures identified on this motion degraded examination. 3. Motion degraded examination without evidence of fracture or static subluxation of the cervical spine. 4. Moderate to severe multilevel cervical spine DDD. Electronically Signed   By: Sandi Mariscal M.D.   On: 11/07/2015 17:22   Ct Cervical Spine Wo Contrast  11/07/2015  CLINICAL DATA:  Passed out today while cooking,  now with cut on the nose. EXAM: CT HEAD WITHOUT CONTRAST CT MAXILLOFACIAL WITHOUT CONTRAST CT CERVICAL SPINE WITHOUT CONTRAST TECHNIQUE: Multidetector CT imaging of the head, cervical spine, and maxillofacial structures were performed using the standard protocol without intravenous contrast. Multiplanar CT image reconstructions of the cervical spine and maxillofacial structures were also generated. COMPARISON:  Head CT - 07/31/2015; 06/12/2015; 12/28/2014 FINDINGS: CT HEAD FINDINGS Similar findings of advanced atrophy with sulcal prominence centralized volume loss with commensurate ex vacuo dilatation of the ventricular system. Scattered periventricular hypodensities compatible microvascular ischemic disease. Bilateral basal ganglial calcifications. Given background parenchymal abnormalities, there is no CT evidence of superimposed acute large territory infarct. No intraparenchymal or extra-axial mass or hemorrhage. Unchanged size and configuration of the ventricles and basilar cisterns. No midline shift. CT MAXILLOFACIAL FINDINGS Evaluation of the facial bones is degraded secondary to patient motion artifact. There is mild soft tissue swelling about the bridge of the nose with potential mildly displaced nasal bone fracture (image 67, series 5). Otherwise, no definite displaced facial fractures given limitation of patient motion. Normal appearance of the bilateral pterygoid plates. Normal appearance of the mandible. The bilateral mandibular condyles are normally located. Normal appearance of the bilateral zygomatic arches. Normal noncontrast appearance of the bilateral orbits and globes. Post left-sided cataract surgery. There is mild rightward nasal septal deviation. There is minimal mucosal thickening within the left maxillary sinus. The remaining paranasal sinuses and mastoid air cells are normally aerated. No air-fluid levels. CT CERVICAL SPINE FINDINGS Evaluation of cervical spine is minimally degraded  secondary to patient motion artifact. C1 to the superior endplate of T1 is imaged. There is mild (approximately 3 mm) of presumably degenerative retrolisthesis of C4 upon C5. Otherwise, normal alignment of the cervical spine. No anterolisthesis or retrolisthesis. The bilateral facets are normally aligned. The dens is normally positioned between the lateral masses of C1. Normal atlantodental and atlantoaxial articulations. No fracture or static subluxation of the cervical spine. Cervical vertebral and heights are preserved. Prevertebral soft tissues are normal. Moderate to severe multilevel cervical spine DDD, likely worse at C4-C5 and to a lesser extent, C3-C4 and C5-C6 with disc space height loss, endplate irregularity and sclerosis. There is  a suspected partial ankylosis involving the C4-C5 and C5-C6 intervertebral disc spaces. Atherosclerotic plaque within the bilateral carotid bulbs. No bulky cervical lymphadenopathy on this noncontrast examination. Limited visualization lung apices demonstrates biapical bullous emphysematous change, right greater than left. IMPRESSION: 1. Advanced atrophy and microvascular ischemic disease without acute intracranial process. 2. Suspected minimally displaced nasal bone fracture. No additional facial fractures identified on this motion degraded examination. 3. Motion degraded examination without evidence of fracture or static subluxation of the cervical spine. 4. Moderate to severe multilevel cervical spine DDD. Electronically Signed   By: Sandi Mariscal M.D.   On: 11/07/2015 17:22   Dg Chest Port 1 View  11/16/2015  CLINICAL DATA:  Intubated EXAM: PORTABLE CHEST 1 VIEW COMPARISON:  11/15/2015 FINDINGS: Endotracheal tube is 4.4 cm above the carina. Right central line is unchanged. Heart is borderline in size. Improving patchy bilateral airspace opacities. No visible effusions. IMPRESSION: Improving bilateral airspace disease. Electronically Signed   By: Rolm Baptise M.D.   On:  11/16/2015 08:12   Dg Chest Port 1 View  11/15/2015  CLINICAL DATA:  Intubation . EXAM: PORTABLE CHEST 1 VIEW COMPARISON:  11/14/2015 . FINDINGS: Endotracheal tube, NG tube, right IJ line in stable position. Cardiomegaly again noted. Diffuse bilateral pulmonary infiltrates again noted. No interim change. No pleural effusion pneumothorax P IMPRESSION: 1. Lines and tubes stable position. 2. Persistent diffuse bilateral pulmonary infiltrates, no change from prior exam. Findings consistent with bilateral pneumonia and/or pulmonary edema. 3. Stable cardiomegaly. Electronically Signed   By: Marcello Moores  Register   On: 11/15/2015 07:20   Dg Chest Port 1 View  11/14/2015  CLINICAL DATA:  Hypoxia EXAM: PORTABLE CHEST 1 VIEW COMPARISON:  November 13, 2015 FINDINGS: Endotracheal tube tip is 4.4 cm above the carina. Nasogastric tube tip and side port are in the stomach; side-port is seen slightly inferior to the gastroesophageal junction. Central catheter tip is in the superior vena cava slightly proximal to the cavoatrial junction. No pneumothorax. There remains interstitial edema throughout the lungs bilaterally with the greatest concentration of edema in the bases. There is patchy alveolar opacity in the bases, likely alveolar edema, although superimposed pneumonia cannot be excluded. Heart is borderline enlarged with pulmonary vascularity within normal limits. There are small pleural effusions bilaterally. IMPRESSION: Tube and catheter positions as described without pneumothorax. Slight increase in bibasilar interstitial and patchy alveolar opacity, likely edema, although superimposed pneumonia in the bases cannot be excluded. The overall appearance is most consistent with a degree of congestive heart failure. Electronically Signed   By: Lowella Grip III M.D.   On: 11/14/2015 07:21   Dg Chest Port 1 View  11/13/2015  CLINICAL DATA:  Endotracheal tube and nasogastric tube placement. EXAM: PORTABLE CHEST 1 VIEW  COMPARISON:  November 12, 2015 FINDINGS: The heart size and mediastinal contours are within normal limits. There is pulmonary edema, decreased compared to prior exam. Patchy consolidation of left lung base is identified. There is a small left pleural effusion. Endotracheal tube is identified with distal tip 5.9 cm from carina. Right central venous line is noted with distal tip in superior vena cava. Nasogastric tube is identified with distal tip not included on the film but is probably at least in the stomach. The visualized skeletal structures are stable. IMPRESSION: Interval decrease pulmonary edema. Findings suspicious for left lung base pneumonia with left pleural effusion. Endotracheal tube in good position. There is no pneumothorax. Nasogastric tube is identified with distal tip not included but is at least in  the stomach. Electronically Signed   By: Abelardo Diesel M.D.   On: 11/13/2015 12:26   Dg Chest Port 1 View  11/12/2015  CLINICAL DATA:  Acute respiratory failure with hypoxia and altered mental status. EXAM: PORTABLE CHEST 1 VIEW COMPARISON:  11/12/2015 FINDINGS: Right internal jugular approach central venous catheter it is stable, tip at the expected location of distal superior vena cava. Cardiomediastinal silhouette is normal. Mediastinal contours appear intact. Atherosclerotic disease of the aorta is seen. There is no evidence of scratched pleural effusion or pneumothorax. There has been interval development of bilateral with central predominance alveolar and interstitial opacities. Osseous structures are without acute abnormality. Soft tissues are grossly normal. IMPRESSION: Rapid development of bilateral with central predominant interstitial and alveolar airspace opacities, with appearance suggestive of ARDS or flash pulmonary edema. These results were called by telephone at the time of interpretation on 11/12/2015 at 6:52 pm to Cleon Dew, RN, who verbally acknowledged these results and will  relayed the results to Dr. Lake Bells. Electronically Signed   By: Fidela Salisbury M.D.   On: 11/12/2015 18:53   Dg Chest Port 1 View  11/12/2015  CLINICAL DATA:  Respiratory failure EXAM: PORTABLE CHEST 1 VIEW COMPARISON:  11/11/2015 FINDINGS: Interval removal of ET tube. The right IJ catheter tip is in the cavoatrial junction. Aortic atherosclerosis. Normal heart size. There is aortic atherosclerosis noted. Stable appearance of bilateral lung opacities. IMPRESSION: 1. No change in bilateral lung opacities. 2. Status post extubation. Electronically Signed   By: Kerby Moors M.D.   On: 11/12/2015 08:36   Dg Chest Port 1 View  11/11/2015  CLINICAL DATA:  Central line placement.  Initial encounter. EXAM: PORTABLE CHEST 1 VIEW COMPARISON:  Chest radiograph performed 11/10/2015 FINDINGS: A right IJ line is noted ending about the mid to distal SVC. The endotracheal tube is seen ending 4-5 cm above the carina. An enteric tube is noted extending below the diaphragm. Patchy bibasilar airspace opacities may reflect mild interstitial edema or pneumonia. A small left pleural effusion is noted. No pneumothorax is seen. The cardiomediastinal silhouette is borderline normal in size. No acute osseous abnormalities are identified. IMPRESSION: 1. Right IJ line noted ending about the mid to distal SVC. 2. Endotracheal tube seen ending 4-5 cm above the carina. 3. Patchy bibasilar airspace opacities may reflect mild interstitial edema or pneumonia. Small left pleural effusion noted. Electronically Signed   By: Garald Balding M.D.   On: 11/11/2015 01:04   Dg Chest Port 1 View  11/10/2015  CLINICAL DATA:  Intubation. Followup interstitial edema superimposed upon chronic interstitial lung disease. EXAM: PORTABLE CHEST 1 VIEW COMPARISON:  11/09/2015 and earlier. FINDINGS: Endotracheal tube tip in satisfactory position projecting approximately 5 cm above the carina. Diffuse interstitial pulmonary edema superimposed upon  chronic interstitial disease, not significantly changed since yesterday. No new pulmonary parenchymal abnormality. IMPRESSION: 1. Endotracheal tube tip in satisfactory position projecting approximately 5 cm above the carina. 2. Stable diffuse interstitial pulmonary edema superimposed upon chronic interstitial lung disease. 3. No new abnormality. Electronically Signed   By: Evangeline Dakin M.D.   On: 11/10/2015 07:52   Dg Chest Port 1 View  11/09/2015  CLINICAL DATA:  Fever. EXAM: PORTABLE CHEST 1 VIEW COMPARISON:  08/05/2015 FINDINGS: Cardiomediastinal silhouette is normal. Mediastinal contours appear intact. There is no evidence of focal airspace consolidation, pleural effusion or pneumothorax. There is diffuse thickening of the interstitial markings, worse than on the prior radiograph. Osseous structures are without acute abnormality. Soft tissues are  grossly normal. IMPRESSION: Diffuse thickening of the interstitial markings, which may be seen with chronic interstitial lung disease. An element of interstitial pulmonary edema cannot be excluded. Electronically Signed   By: Fidela Salisbury M.D.   On: 11/09/2015 21:10   Dg Abd Portable 1v  11/10/2015  CLINICAL DATA:  Feeding tube placement EXAM: PORTABLE ABDOMEN - 1 VIEW COMPARISON:  None. FINDINGS: NG tube is in place with the tip in the mid to distal stomach, and the side port in the mid stomach. Nonobstructive bowel gas pattern. IMPRESSION: Feeding tube tip in the mid to distal stomach. Electronically Signed   By: Rolm Baptise M.D.   On: 11/10/2015 12:13   Dg Swallowing Func-speech Pathology  11/17/2015  Objective Swallowing Evaluation:   Patient Details Name: VRAJ TEASE MRN: [id number] Date of Birth: 06/28/1929 Today's Date: 11/17/2015 Time: SLP Start Time (ACUTE ONLY): 1117-SLP Stop Time (ACUTE ONLY): 1145 SLP Time Calculation (min) (ACUTE ONLY): 28 min Past Medical History: @PMH @ Past Surgical History: Past Surgical History Procedure  Laterality Date . Colonoscopy w/ polypectomy  2001, 2006 . Hernia repair   . Prostate surgery   HPI: 82 year old male with PMH of HTN, COPD, Parkinson's, ETOH admitted 12/20 with fall down steps, AMS, intoxication, scalp and forehead lacerations. UA was suspicious for UTI. Declined 12/23 requiring intubation. Extubated 12/24. Seen by SLP found to have acute reversible dysphagia, but then reintubated until 12/29. SLP reordered.  No Data Recorded Assessment / Plan / Recommendation CHL IP CLINICAL IMPRESSIONS 11/17/2015 Therapy Diagnosis Mild oral phase dysphagia;Mild pharyngeal phase dysphagia Clinical Impression Pt demonstrates mild oral dysphagia with brief oral holding/pumping prior to initiating the swallow, though there is no premature spill; bolus remains contained. Oropharyngeal function is characterized by mild base of tongue weakness resulting in residuals that penetrate the airway post swallow with nectar and thin liquids. Pt clears throat either immediately or a few swallows later, clearing trace penetrate. No aspiration observed. Pt may initiate a regular diet and thin liquids with basic aspiration precautions. Pt should be encouraged to clear his throat. SLP will f/u for tolerance.  Impact on safety and function Mild aspiration risk   [facility] IP TREATMENT RECOMMENDATION 11/17/2015 Treatment Recommendations Therapy as outlined in treatment plan below   Prognosis 11/17/2015 Prognosis for Safe Diet Advancement Good Barriers to Reach Goals Cognitive deficits Barriers/Prognosis Comment -- [facility] IP DIET RECOMMENDATION 11/17/2015 SLP Diet Recommendations Regular solids;Thin liquid Liquid Administration via Cup;Straw Medication Administration Whole meds with liquid Compensations Minimize environmental distractions;Slow rate;Small sips/bites Postural Changes Remain semi-upright after after feeds/meals (Comment)   [facility] IP OTHER RECOMMENDATIONS 11/17/2015 Recommended Consults -- Oral Care Recommendations Oral care BID  Other Recommendations --   [facility] IP FOLLOW UP RECOMMENDATIONS 11/17/2015 Follow up Recommendations Skilled Nursing facility   [facility] IP FREQUENCY AND DURATION 11/17/2015 Speech Therapy Frequency (ACUTE ONLY) min 2x/week Treatment Duration 1 week      [facility] IP ORAL PHASE 11/17/2015 Oral Phase Impaired Oral - Pudding Teaspoon -- Oral - Pudding Cup -- Oral - Honey Teaspoon -- Oral - Honey Cup -- Oral - Nectar Teaspoon -- Oral - Nectar Cup Delayed oral transit;Lingual pumping;Holding of bolus Oral - Nectar Straw -- Oral - Thin Teaspoon -- Oral - Thin Cup Delayed oral transit;Lingual pumping;Holding of bolus Oral - Thin Straw Delayed oral transit;Lingual pumping;Holding of bolus Oral - Puree Delayed oral transit;Lingual pumping;Holding of bolus Oral - Mech Soft -- Oral - Regular Delayed oral transit;Lingual pumping;Holding of bolus Oral - Multi-Consistency -- Oral -  Pill Delayed oral transit;Lingual pumping;Holding of bolus Oral Phase - Comment --  [facility] IP PHARYNGEAL PHASE 11/17/2015 Pharyngeal Phase Impaired Pharyngeal- Pudding Teaspoon -- Pharyngeal -- Pharyngeal- Pudding Cup -- Pharyngeal -- Pharyngeal- Honey Teaspoon -- Pharyngeal -- Pharyngeal- Honey Cup -- Pharyngeal -- Pharyngeal- Nectar Teaspoon -- Pharyngeal -- Pharyngeal- Nectar Cup Pharyngeal residue - valleculae;Penetration/Apiration after swallow;Reduced tongue base retraction Pharyngeal Material enters airway, remains ABOVE vocal cords and not ejected out Pharyngeal- Nectar Straw -- Pharyngeal -- Pharyngeal- Thin Teaspoon -- Pharyngeal -- Pharyngeal- Thin Cup Pharyngeal residue - valleculae;Penetration/Apiration after swallow;Reduced tongue base retraction Pharyngeal Material enters airway, CONTACTS cords and then ejected out Pharyngeal- Thin Straw Pharyngeal residue - valleculae;Penetration/Apiration after swallow;Reduced tongue base retraction Pharyngeal Material enters airway, CONTACTS cords and then ejected out Pharyngeal- Puree Reduced epiglottic  inversion;Reduced tongue base retraction Pharyngeal -- Pharyngeal- Mechanical Soft -- Pharyngeal -- Pharyngeal- Regular Reduced epiglottic inversion;Reduced tongue base retraction Pharyngeal -- Pharyngeal- Multi-consistency -- Pharyngeal -- Pharyngeal- Pill -- Pharyngeal -- Pharyngeal Comment --  No flowsheet data found. Herbie [place], [place] CCC-SLP [phone] Othelia Pulling Katherene Ponto 11/17/2015, 12:37 PM              Ct Maxillofacial Wo Cm  11/07/2015  CLINICAL DATA:  Passed out today while cooking, now with cut on the nose. EXAM: CT HEAD WITHOUT CONTRAST CT MAXILLOFACIAL WITHOUT CONTRAST CT CERVICAL SPINE WITHOUT CONTRAST TECHNIQUE: Multidetector CT imaging of the head, cervical spine, and maxillofacial structures were performed using the standard protocol without intravenous contrast. Multiplanar CT image reconstructions of the cervical spine and maxillofacial structures were also generated. COMPARISON:  Head CT - 07/31/2015; 06/12/2015; 12/28/2014 FINDINGS: CT HEAD FINDINGS Similar findings of advanced atrophy with sulcal prominence centralized volume loss with commensurate ex vacuo dilatation of the ventricular system. Scattered periventricular hypodensities compatible microvascular ischemic disease. Bilateral basal ganglial calcifications. Given background parenchymal abnormalities, there is no CT evidence of superimposed acute large territory infarct. No intraparenchymal or extra-axial mass or hemorrhage. Unchanged size and configuration of the ventricles and basilar cisterns. No midline shift. CT MAXILLOFACIAL FINDINGS Evaluation of the facial bones is degraded secondary to patient motion artifact. There is mild soft tissue swelling about the bridge of the nose with potential mildly displaced nasal bone fracture (image 67, series 5). Otherwise, no definite displaced facial fractures given limitation of patient motion. Normal appearance of the bilateral pterygoid plates. Normal appearance of the mandible. The  bilateral mandibular condyles are normally located. Normal appearance of the bilateral zygomatic arches. Normal noncontrast appearance of the bilateral orbits and globes. Post left-sided cataract surgery. There is mild rightward nasal septal deviation. There is minimal mucosal thickening within the left maxillary sinus. The remaining paranasal sinuses and mastoid air cells are normally aerated. No air-fluid levels. CT CERVICAL SPINE FINDINGS Evaluation of cervical spine is minimally degraded secondary to patient motion artifact. C1 to the superior endplate of T1 is imaged. There is mild (approximately 3 mm) of presumably degenerative retrolisthesis of C4 upon C5. Otherwise, normal alignment of the cervical spine. No anterolisthesis or retrolisthesis. The bilateral facets are normally aligned. The dens is normally positioned between the lateral masses of C1. Normal atlantodental and atlantoaxial articulations. No fracture or static subluxation of the cervical spine. Cervical vertebral and heights are preserved. Prevertebral soft tissues are normal. Moderate to severe multilevel cervical spine DDD, likely worse at C4-C5 and to a lesser extent, C3-C4 and C5-C6 with disc space height loss, endplate irregularity and sclerosis. There is a suspected partial ankylosis involving the C4-C5 and C5-C6 intervertebral  disc spaces. Atherosclerotic plaque within the bilateral carotid bulbs. No bulky cervical lymphadenopathy on this noncontrast examination. Limited visualization lung apices demonstrates biapical bullous emphysematous change, right greater than left. IMPRESSION: 1. Advanced atrophy and microvascular ischemic disease without acute intracranial process. 2. Suspected minimally displaced nasal bone fracture. No additional facial fractures identified on this motion degraded examination. 3. Motion degraded examination without evidence of fracture or static subluxation of the cervical spine. 4. Moderate to severe multilevel  cervical spine DDD. Electronically Signed   By: Sandi Mariscal M.D.   On: 11/07/2015 17:22     Assessment/Plan   ICD-9-CM ICD-10-CM   1. Fall down stairs, initial encounter E880.9 W10.8XXA   2. ETOH abuse 305.00 F10.10   3. Paroxysmal atrial fibrillation (HCC) 427.31 I48.0   4. Essential hypertension 401.9 I10   5. Parkinson disease ([place]) 332.0 G20   6. Hyperlipidemia 272.4 E78.5   7. Thrombocytopenia (HCC) 287.5 D69.6   8. Anemia associated with nutritional deficiency, unspecified nutritional anemia type 281.9 D53.9   9.      Moderate malnutirion  Cont current meds as ordered  Complete Etoh cessation discussed and highly urged  Check CBC w diff and CMP as ordered  Cont nutritional supplements as ordered  PT/OT/ST as ordered  GOAL: short term rehab and d/c home when medically appropriate. Communicated with pt and nursing.  Will follow  Yarnell Arvidson S. Perlie Gold  [facility] [address] [place], [place] [address] [phone] Cell (Monday-Friday 8 AM - 5 PM) [phone] After 5 PM and follow prompts

## 2015-12-13 ENCOUNTER — Telehealth: Payer: Self-pay

## 2015-12-13 NOTE — Telephone Encounter (Signed)
Nurse Alyse Low  from [facility] called and ask if you could call and speak to Dr. Zadie Rhine regarding patients care he would like to talk to you.

## 2015-12-13 NOTE — Telephone Encounter (Signed)
S/w Dr Zadie Rhine. She is c/a pt's safety if he will be d/cd home from SNF again. Her office has had to call APS regarding pt involving fall hospitalization. Pt has then been readmitted to SNF early January and is c/a his safety at home as he is unable to care for himself due to Etoh abuse. Will d/w NP and SNF regarding pt's status. Will consider for long term care. He may need guardianship as well.

## 2015-12-25 ENCOUNTER — Non-Acute Institutional Stay (SKILLED_NURSING_FACILITY): Payer: Medicare Other | Admitting: Adult Health

## 2015-12-25 DIAGNOSIS — I4891 Unspecified atrial fibrillation: Secondary | ICD-10-CM

## 2015-12-25 DIAGNOSIS — E440 Moderate protein-calorie malnutrition: Secondary | ICD-10-CM

## 2015-12-25 DIAGNOSIS — G20 Parkinson's disease: Secondary | ICD-10-CM | POA: Diagnosis not present

## 2015-12-25 DIAGNOSIS — F10931 Alcohol use, unspecified with withdrawal delirium: Secondary | ICD-10-CM

## 2015-12-25 DIAGNOSIS — F10231 Alcohol dependence with withdrawal delirium: Secondary | ICD-10-CM

## 2016-01-22 ENCOUNTER — Other Ambulatory Visit: Payer: Self-pay | Admitting: Adult Health

## 2016-01-24 ENCOUNTER — Ambulatory Visit: Payer: Medicare Other | Admitting: Neurology

## 2016-01-29 ENCOUNTER — Encounter: Payer: Self-pay | Admitting: Adult Health

## 2016-01-29 NOTE — Progress Notes (Signed)
Patient ID: Victor Clarke, male   DOB: 16-Aug-1929, 82 y.o.   MRN: [id number]    Facility: [facility]       No Known Allergies  Chief Complaint  Patient presents with  . Discharge Note    HPI:  He is being discharged to assisted living; as he is unable to manage his care at home. He will not need any dme or home health. He will need his presriptions to be written and he will follow up with the medical provider at the facility.   Past Medical History  Diagnosis Date  . Hypertension   . Hypercholesteremia   . Gout   . COPD (chronic obstructive pulmonary disease) ([place])   . Detached retina     a. s/p surgical correction on the right.  . Prostate cancer ([place])     a. s/p TURP.  Marland Kitchen Ectopic atrial tachycardia ([place])     a. 08/2014 Echo: EF 55-60%, mildly dil LA.  . Parkinson's disease ([place])   . Left inguinal hernia     a. s/p repair ~ 10 yrs ago.    Past Surgical History  Procedure Laterality Date  . Colonoscopy w/ polypectomy  2001, 2006  . Hernia repair    . Prostate surgery      VITAL SIGNS BP 105/64 mmHg  Pulse 77  Ht 5\' 6"  (1.676 m)  Wt 141 lb 9.6 oz (64.229 kg)  BMI 22.87 kg/m2  SpO2 98%  Patient's Medications  New Prescriptions   No medications on file  Previous Medications   ALLOPURINOL (ZYLOPRIM) 100 MG TABLET    Take 100 mg by mouth daily.    ASPIRIN EC 81 MG TABLET    Take 81 mg by mouth daily.   ATORVASTATIN (LIPITOR) 40 MG TABLET    Take 40 mg by mouth daily.   CARBIDOPA-LEVODOPA (SINEMET IR) 25-100 MG PER TABLET    Take 0.5 tablets by mouth 3 (three) times daily.   DILTIAZEM (CARDIZEM CD) 120 MG 24 HR CAPSULE    Take 1 capsule (120 mg total) by mouth daily.   FEEDING SUPPLEMENT, ENSURE ENLIVE, (ENSURE ENLIVE) LIQD    Take 237 mLs by mouth 3 (three) times daily between meals.   FOLIC ACID (FOLVITE) 1 MG TABLET    Take 1 tablet (1 mg total) by mouth daily.   LISINOPRIL (PRINIVIL,ZESTRIL) 5 MG TABLET    Take 1 tablet (5 mg total) by mouth daily.   OMEGA-3 FATTY ACIDS (FISH OIL) 1200 MG CAPS    Take 2,400 mg by mouth daily.   THIAMINE 100 MG TABLET    Take 1 tablet (100 mg total) by mouth daily.  Modified Medications   No medications on file  Discontinued Medications   No medications on file     SIGNIFICANT DIAGNOSTIC EXAMS   Review of Systems  Constitutional: Negative for malaise/fatigue.  Respiratory: Negative for cough and shortness of breath.   Cardiovascular: Negative for chest pain, palpitations and leg swelling.  Gastrointestinal: Negative for heartburn, abdominal pain and constipation.  Musculoskeletal: Negative for myalgias, back pain and joint pain.  Skin: Negative.   Neurological: Negative for dizziness.  Psychiatric/Behavioral: The patient is not nervous/anxious.     Physical Exam  Constitutional: No distress.  Eyes: Conjunctivae are normal.  Neck: Neck supple. No JVD present. No thyromegaly present.  Cardiovascular: Normal rate, regular rhythm and intact distal pulses.   Respiratory: Effort normal and breath sounds normal. No respiratory distress. He has no wheezes.  GI:  Soft. Bowel sounds are normal. He exhibits no distension. There is no tenderness.  Musculoskeletal: He exhibits no edema.  Able to move all extremities   Lymphadenopathy:    He has no cervical adenopathy.  Neurological: He is alert.  Skin: Skin is warm and dry. He is not diaphoretic.  Psychiatric: He has a normal mood and affect.      ASSESSMENT/ PLAN:  Will discharge him to assisted living. He will not need dme. He will not need home health services. His prescriptions have been written for a 30 day supply of his medications. He will follow up with he medical provider at he facility.     Time spent with patient  35  minutes >50% time spent counseling; reviewing medical record; tests; labs; and developing future plan of care    Ok Edwards NP [facility]  Contact [phone] Monday through Friday 8am- 5pm  After  hours call [phone]

## 2016-01-30 ENCOUNTER — Ambulatory Visit (INDEPENDENT_AMBULATORY_CARE_PROVIDER_SITE_OTHER): Payer: Medicare Other | Admitting: Neurology

## 2016-01-30 ENCOUNTER — Encounter: Payer: Self-pay | Admitting: Neurology

## 2016-01-30 VITALS — BP 120/79 | HR 79 | Ht 69.0 in | Wt 152.5 lb

## 2016-01-30 DIAGNOSIS — Z91199 Patient's noncompliance with other medical treatment and regimen due to unspecified reason: Secondary | ICD-10-CM

## 2016-01-30 DIAGNOSIS — G20 Parkinson's disease: Secondary | ICD-10-CM

## 2016-01-30 DIAGNOSIS — R4189 Other symptoms and signs involving cognitive functions and awareness: Secondary | ICD-10-CM

## 2016-01-30 DIAGNOSIS — Z9119 Patient's noncompliance with other medical treatment and regimen: Secondary | ICD-10-CM

## 2016-01-30 DIAGNOSIS — R251 Tremor, unspecified: Secondary | ICD-10-CM

## 2016-01-30 NOTE — Progress Notes (Signed)
[facility]    Provider:  Dr Jaynee Eagles Referring Provider: Aretta Nip, MD Primary Care Physician:  Aretta Nip, MD  CC: Dizziness  Interval history: Patient was admitted to [facility] hospital 11/20/2015 after a fall, diagnosed with lactic acidosis, UTI, alcohol intoxication, AMS. He has a PMHx of HTN, HLD, Alcohol abuse with intoxication, resting tremor improved with Sinemet, acute respiratory failure with hypercapnia, malnutrition, COPD. He is here because no one will let him drive. He is here to appeal to me. He says he has stopped drinking.  He says he is aware that he has memory loss. He wants me to write a letter saying he can drive in my opinion. I cannot do that for him.   His license has been medically revoked and he is here today to appeal to me to help him get his license back. I had a long discussion with patient and I apologized that I can't help him. I do understand that the ability to drive a car is very important to independence. Unfortunately I do feel that patient should not be driving due to his cognitive impairment, noncompliance with medical management, syncopal episodes or falls, alcoholic intoxication and encephalopathy. I feel that operating a vehicle will be dangerous to patient and others on the road.   02/2015: BAY BLAZEJEWSKI is a 82 y.o. male here as a follow up  No more syncopal episodes. No dizziness anymore. He is not remembering to take his medications. No taking the Sinemet, not interested in taking it. Tremor isn' t bothering him. Doesn't affect him. He takes his time when he gets out of bed. He is not interested in coming back unless he needs to.   Interval History 11/15/2014: BRAYDIN FLAMAND is a 82 y.o. male here as a follow up for Dizziness  The dizzyness has improved. Has not had any falls. He has been following with cardiologist. Sometimes he feels a little mild lightheaded when getting up, if he sits for 30 seconds it gets  better. The Sinemet never helped. He is very active, he seems to be getting around fine. He has not had any presyncopal episodes or syncopal episodes since last being seen. He is trying to take his medication daily. He is missing some doses but tries to keep up as best he can. He got pill sorter to help. When asked about his memory, he says "I am slowing down, I am 86". Denies any significant memory changes. He writes down his appointments, not getting lost when he goes out, not forgeting to pay bills, denies having some incidents of things like turning off the stove in the house or doing anything that would put him in danger. Last night in fact he double checked the burners. He doesn't think he needs more medication, doesn't want anything for memory. He feels more confident about his situation. Has some tremors but they are manageable. Not interfering with daily life except maybe his handwriting a little. He is aware of SOB when he is walking but walking is fine. He doesn't know if he had the MRI of his brain complete.   At last appointment he was referred to cardiology because dizzyness was suspected to be heart related. He was diagnosed with Ectopic atrial tachycardia and started on AV nodal blockade, diltiazem 120 mg CD once a day. He was subsequently seen by Dr. Rayann Heman to discuss EP study and radioablation.   Initial Appointment 09/29/2014: HPI: WARREN VIVIAN is a 82 y.o. male  here as a referral from Dr. Radene Ou for balance and gait disorder  Patient continues to have episodes of dizziness. Worsening. He fell yesterday at home because he felt dizzy. He was originally seen here in the office and displayed parkinsonian symptoms on clinical exam however sinemet did not help and he has stopped taking the Sinemet.And his chief complaint was dizziness, he is not concerned with gait or balance. The dizziness is worsening. He was standing at the counter at the kitchen yesterday and a dizzy spell came over  him and he flopped down on the floor. Episode lasted 3 minutes. He was clutching the counter just trying to stand. No loss of consciousness. The room was not spinning. Can't describe what he feels is dizzy. "I was just struggling to get up". He was conscious. Fell on his knees. He was SOB during the episode. He sat in a chair and then all of a sudden he felt better.   This morning he was dizzy but not enough to fall. He was at the counter at the kitchen standing making coffee. He lives alone in his home. Dizziness lasted less than a minute then resolved. No chest pain. He feels his heart racing occasionally. He is having SOB with the dizziness.  He says he is missing medication. He forgets. He misses his schedule of some of the drugs. Sometimes "I just don't think to take it". He has no family to help him. He doesn't want to live in an assisted living facility. Family is not near by, his brother is in [place] and niece lives in [place]. He was going to buy a pill organizer today as suggested by his niece. He has not taken his medication yet today. "I just didn't think about it" referring to taking his medication.  Reviewed notes, labs and imaging from outside physicians, which showed; Patient was admitted on 10/30 for SOB and dizziness. While being evaluated in emergency department noted short run of apparent A. fib with RVR which self resolved. ECHo 11.1.2015: There was moderate concentric hypertrophy. Systolic function was normal. The estimated ejection fraction was in the range of 55% to 60%. Wall motion was normal; there were no regional wall motion abnormalities. Left ventricular diastolic function parameters were normal. Aortic valve: Valve area (Vmax): 2.42 cm^2. - Mitral valve: Calcified annulus. - Left atrium: The atrium was mildly dilated. Ct angi chest no PE. No events on telemetry, 2 12 leads EKG no change.  Review of Systems: Patient complains of symptoms per HPI as well as the  following symptoms: SOB, dizziness. Denies CP. Pertinent negatives per HPI. All others negative.     Social History   Social History  . Marital Status: Widowed    Spouse Name: N/A  . Number of Children: 0  . Years of Education: College   Occupational History  . Retired Other   Social History Main Topics  . Smoking status: Former Smoker -- 2.00 packs/day for 22 years    Types: Cigarettes    Quit date: 11/18/1977  . Smokeless tobacco: Never Used  . Alcohol Use: No     Comment: 4 oz of gin daily.  . Drug Use: No  . Sexual Activity: No   Other Topics Concern  . Not on file   Social History Narrative   Patient lives at home alone.   Caffeine Use: 1 cup daily    Family History  Problem Relation Age of Onset  . Stroke Father     died @  61.  . Heart attack Mother     died @ 40  . Stroke Brother     died in his 79's  . Heart attack Brother     died in his 47's    Past Medical History  Diagnosis Date  . Hypertension   . Hypercholesteremia   . Gout   . COPD (chronic obstructive pulmonary disease) ([place])   . Detached retina     a. s/p surgical correction on the right.  . Prostate cancer ([place])     a. s/p TURP.  Marland Kitchen Ectopic atrial tachycardia ([place])     a. 08/2014 Echo: EF 55-60%, mildly dil LA.  . Parkinson's disease ([place])   . Left inguinal hernia     a. s/p repair ~ 10 yrs ago.    Past Surgical History  Procedure Laterality Date  . Colonoscopy w/ polypectomy  2001, 2006  . Hernia repair    . Prostate surgery      Current Outpatient Prescriptions  Medication Sig Dispense Refill  . allopurinol (ZYLOPRIM) 100 MG tablet Take 100 mg by mouth daily.     Marland Kitchen aspirin EC 81 MG tablet Take 81 mg by mouth daily.    Marland Kitchen atorvastatin (LIPITOR) 40 MG tablet Take 40 mg by mouth daily.    . carbidopa-levodopa (SINEMET IR) 25-100 MG per tablet Take 0.5 tablets by mouth 3 (three) times daily.    Marland Kitchen diltiazem (CARDIZEM CD) 120 MG 24 hr capsule Take 1 capsule (120 mg total) by  mouth daily. 30 capsule 2  . feeding supplement, ENSURE ENLIVE, (ENSURE ENLIVE) LIQD Take 237 mLs by mouth 3 (three) times daily between meals. 123XX123 mL 12  . folic acid (FOLVITE) 1 MG tablet Take 1 tablet (1 mg total) by mouth daily.    Marland Kitchen lisinopril (PRINIVIL,ZESTRIL) 5 MG tablet Take 1 tablet (5 mg total) by mouth daily. 90 tablet 3  . Omega-3 Fatty Acids (FISH OIL) 1200 MG CAPS Take 2,400 mg by mouth daily.    Marland Kitchen thiamine 100 MG tablet Take 1 tablet (100 mg total) by mouth daily.     No current facility-administered medications for this visit.    Allergies as of 01/30/2016  . (No Known Allergies)    Vitals: BP 120/79 mmHg  Pulse 79  Ht 5\' 9"  (1.753 m)  Wt 152 lb 8 oz (69.174 kg)  BMI 22.51 kg/m2 Last Weight:  Wt Readings from Last 1 Encounters:  01/30/16 152 lb 8 oz (69.174 kg)   Last Height:   Ht Readings from Last 1 Encounters:  01/30/16 5\' 9"  (1.753 m)    Cranial Nerves:  The pupils are equal, round, and reactive to light. Visual fields are full to finger confrontation. Extraocular movements are intact. Trigeminal sensation is intact and the muscles of mastication are normal. The face is symmetric. The palate elevates in the midline. Voice is normal. Shoulder shrug is normal. The tongue has normal motion without fasciculations.   Coordination:  Normal finger to nose and heel to shin. Good RAM bilaterally, finger tap. Difficulty with tandem.  Gait:  Good stride, turn. Mildly reduced arm swing.  Motor Observation:  Left > right high-amplitude low-frequency resting and postural tremor. mproved today, he took Sinemet.  Tone:  Left arm and hand cogwheeling, increased with facilitation improved (on sinemet)  Posture:  Posture is normal.    Strength:  Strength is V/V in the upper and lower limbs.    Sensation: intact to LT   Reflex Exam:  DTR's:  Brisk throughout  Assessment/Plan: 82 year old male who is referred from his physician for  balance and gait evaluation. He has mostly complained about episodes of dizziness, his heart rate was 166 and irregular initially and then becomes regular and we sent him to cardiology for management which improved his symptoms.   He was diagnosed with Ectopic atrial tachycardia and started on AV nodal blockade, diltiazem 120 mg CD once a day. He was subsequently seen by Dr. Rayann Heman to discuss EP study and radioablation. Dizziness resolved. He is a poor historian and has cognitive deficits and is noncompliant.He has a history of alcohol abuse.    - His license has been medically revoked and he is here today to appeal to me to help him get his license back. I had a long discussion with patient and I apologized that I can't help him. I do understand that the ability to drive a car is very important to independence. Unfortunately I do feel that patient should not be driving due to his cognitive impairment, noncompliance with medical management, syncopal episodes or falls, alcoholic intoxication and encephalopathy. I feel that operating a vehicle will be dangerous to patient and others on the road.  - He does appear to have some very mild parkinsonism  on exam. He demonstrates a left>right high amlitude, low frequency resting and postural tremor that improved with Sinemet, mildly reducedblink, left arm cogwheeling which worsens with facilitation and brisk reflexes throughout. However this appears mild.  -Non compliance: He is now in rehab.  -Cognitive dysfunction: will continue to follow. He denies significant impairment Montral cognitive assessment score today 17 out of 30 as well as his poor insight and judgment are consistent with moderate to advanced cognitive impairment.  - Dizziness: resolved .   Sarina Ill, MD  [facility] [address] [place], [place] [address]  Phone [phone] Fax [phone]  A total of 30 minutes was spent face-to-face with this  patient. Over half this time was spent on counseling patient on the parkinsonism, cognitive impairment diagnosis and different diagnostic and therapeutic options available.

## 2016-02-01 ENCOUNTER — Encounter: Payer: Self-pay | Admitting: *Deleted

## 2016-02-01 NOTE — Progress Notes (Signed)
Faxed Dr Cathren Laine recent office note to [facility] for their records. Fax: [phone]. Received confirmation.

## 2016-02-05 ENCOUNTER — Other Ambulatory Visit: Payer: Self-pay | Admitting: Adult Health

## 2016-02-08 NOTE — Progress Notes (Signed)
Patient ID: JOURDON ZIMMERLE, male   DOB: 1929-06-10, 82 y.o.   MRN: [id number]    HISTORY AND PHYSICAL   DATE: 08/10/15  Location:  [facility]    Place of Service: SNF [phone])   Extended Emergency Contact Information Primary Emergency Contact: Westley Gambles, [place] Phone: [phone] Relation: Brother Secondary Emergency Contact: Hassel Neth [place] Phone: [phone] Mobile Phone: [phone] Relation: Niece  Advanced Directive information  DNR  Chief Complaint  Patient presents with  . New Admit To SNF    HPI:  82 yo male seen today as a new admission into SNF following hospital stay for Etoh withdrawal delirium, orthostatic hypotension, dehydration, UTI, Parkinson's, ectopic atrial tachycardia, dizziness and lactic acidosis. He became volume overloaded due to IVF and had hypoxia (O2 80%; pAO2 57.7). CXR -->pulmonary edema that improved with IV lasix. BC neg. Cr 0.97 at d/c with Hgb 12.3. Albumin 4.1. Per hospital records, he was drinking 2 shots of bourbon daily at home. UTI tx with ceftin  Nursing c/a fluctuating BPs. On 9/21, BP standing 103/65, sitting 116/73 and supine 130/71. Pt has no c/o. Sleeping and eating well. No falls. He is a poor historian due to mental status. Hx obtained from chart  Parkinson's disease - on sinemet  Gout - on allopurinol. No acute flares  HTN/atrial tachycardia - BP/HR stable diltiazem. He takes lisinopril and ASA daily  Hyperlipidemia - stable on lipitor and fish oil   Past Medical History  Diagnosis Date  . Hypertension   . Hypercholesteremia   . Gout   . COPD (chronic obstructive pulmonary disease) ([place])   . Detached retina     a. s/p surgical correction on the right.  . Prostate cancer ([place])     a. s/p TURP.  Marland Kitchen Ectopic atrial tachycardia ([place])     a. 08/2014 Echo: EF 55-60%, mildly dil LA.  . Parkinson's disease ([place])   . Left  inguinal hernia     a. s/p repair ~ 10 yrs ago.    Past Surgical History  Procedure Laterality Date  . Colonoscopy w/ polypectomy  2001, 2006  . Hernia repair    . Prostate surgery      Patient Care Team: Aretta Nip, MD as PCP - General (Family Medicine)  Social History   Social History  . Marital Status: Widowed    Spouse Name: N/A  . Number of Children: 0  . Years of Education: College   Occupational History  . Retired Other   Social History Main Topics  . Smoking status: Former Smoker -- 2.00 packs/day for 22 years    Types: Cigarettes    Quit date: 11/18/1977  . Smokeless tobacco: Never Used  . Alcohol Use: No     Comment: 4 oz of gin daily.  . Drug Use: No  . Sexual Activity: No   Other Topics Concern  . Not on file   Social History Narrative   Patient lives at home alone.   Caffeine Use: 1 cup daily     reports that he quit smoking about 38 years ago. His smoking use included Cigarettes. He has a 44 pack-year smoking history. He has never used smokeless tobacco. He reports that he does not drink alcohol or use illicit drugs.  Family History  Problem Relation Age of Onset  . Stroke Father     died @ 29.  Marland Kitchen  Heart attack Mother     died @ 24  . Stroke Brother     died in his 85's  . Heart attack Brother     died in his 42's   Family Status  Relation Status Death Age  . Father Deceased 69  . Mother Deceased 75    Immunization History  Administered Date(s) Administered  . Influenza-Unspecified 08/09/2015  . PPD Test 08/11/2015  . Tdap 11/07/2015    No Known Allergies  Medications: Patient's Medications  New Prescriptions   FOLIC ACID (FOLVITE) 1 MG TABLET    Take 1 tablet (1 mg total) by mouth daily.   LISINOPRIL (PRINIVIL,ZESTRIL) 5 MG TABLET    Take 1 tablet (5 mg total) by mouth daily.   THIAMINE 100 MG TABLET    Take 1 tablet (100 mg total) by mouth daily.  Previous Medications   ALLOPURINOL (ZYLOPRIM) 100 MG TABLET    Take  100 mg by mouth daily.    ASPIRIN EC 81 MG TABLET    Take 81 mg by mouth daily.   ATORVASTATIN (LIPITOR) 40 MG TABLET    Take 40 mg by mouth daily.   CARBIDOPA-LEVODOPA (SINEMET IR) 25-100 MG PER TABLET    Take 0.5 tablets by mouth 3 (three) times daily.   DILTIAZEM (CARDIZEM CD) 120 MG 24 HR CAPSULE    Take 1 capsule (120 mg total) by mouth daily.   FEEDING SUPPLEMENT, ENSURE ENLIVE, (ENSURE ENLIVE) LIQD    Take 237 mLs by mouth 3 (three) times daily between meals.   OMEGA-3 FATTY ACIDS (FISH OIL) 1200 MG CAPS    Take 2,400 mg by mouth daily.  Modified Medications   No medications on file  Discontinued Medications   CEFUROXIME (CEFTIN) 500 MG TABLET    Take 1 tablet (500 mg total) by mouth 2 (two) times daily with a meal.   LISINOPRIL (PRINIVIL,ZESTRIL) 20 MG TABLET    Take 20 mg by mouth daily.    Review of Systems  Unable to perform ROS: Other    Filed Vitals:   08/10/15 1143  BP: 130/70  Pulse: 78  Temp: 98.8 F (37.1 C)  Weight: 134 lb (60.782 kg)  SpO2: 95%   Body mass index is 19.78 kg/(m^2).  Physical Exam  Constitutional: He appears well-developed.  Sitting on bed in NAD, frail appearing  HENT:  Mouth/Throat: Oropharynx is clear and moist.  Eyes: Pupils are equal, round, and reactive to light. No scleral icterus.  Neck: Neck supple. Carotid bruit is not present. No thyromegaly present.  Cardiovascular: Normal rate, regular rhythm and intact distal pulses.  Exam reveals no gallop and no friction rub.   Murmur (1/6 SEM) heard. Trace LE edema b/l. No calf TTP  Pulmonary/Chest: Effort normal and breath sounds normal. He has no wheezes. He has no rales. He exhibits no tenderness.  Abdominal: Soft. Bowel sounds are normal. He exhibits no distension, no abdominal bruit, no pulsatile midline mass and no mass. There is no tenderness. There is no rebound and no guarding.  Lymphadenopathy:    He has no cervical adenopathy.  Neurological: He is alert. He displays tremor (fine  resting).  Skin: Skin is warm and dry. No rash noted.  Psychiatric: He has a normal mood and affect. His behavior is normal. Judgment and thought content normal.     Labs reviewed: Admission on 07/31/2015, Discharged on 08/08/2015  No results displayed because visit has over 200 results.  .  Admission on 06/12/2015, Discharged on  06/12/2015  Component Date Value Ref Range Status  . WBC 06/12/2015 4.4  4.0 - 10.5 K/uL Final  . RBC 06/12/2015 3.76* 4.22 - 5.81 MIL/uL Final  . Hemoglobin 06/12/2015 13.4  13.0 - 17.0 g/dL Final  . HCT 06/12/2015 38.6* 39.0 - 52.0 % Final  . MCV 06/12/2015 102.7* 78.0 - 100.0 fL Final  . MCH 06/12/2015 35.6* 26.0 - 34.0 pg Final  . MCHC 06/12/2015 34.7  30.0 - 36.0 g/dL Final  . RDW 06/12/2015 13.4  11.5 - 15.5 % Final  . Platelets 06/12/2015 129* 150 - 400 K/uL Final  . Sodium 06/12/2015 141  135 - 145 mmol/L Final  . Potassium 06/12/2015 3.6  3.5 - 5.1 mmol/L Final  . Chloride 06/12/2015 108  101 - 111 mmol/L Final  . CO2 06/12/2015 17* 22 - 32 mmol/L Final  . Glucose, Bld 06/12/2015 99  65 - 99 mg/dL Final  . BUN 06/12/2015 14  6 - 20 mg/dL Final  . Creatinine, Ser 06/12/2015 0.88  0.61 - 1.24 mg/dL Final  . Calcium 06/12/2015 8.6* 8.9 - 10.3 mg/dL Final  . Total Protein 06/12/2015 7.2  6.5 - 8.1 g/dL Final  . Albumin 06/12/2015 4.0  3.5 - 5.0 g/dL Final  . AST 06/12/2015 101* 15 - 41 U/L Final  . ALT 06/12/2015 56  17 - 63 U/L Final  . Alkaline Phosphatase 06/12/2015 55  38 - 126 U/L Final  . Total Bilirubin 06/12/2015 0.8  0.3 - 1.2 mg/dL Final  . GFR calc non Af Amer 06/12/2015 >60  >60 mL/min Final  . GFR calc Af Amer 06/12/2015 >60  >60 mL/min Final   Comment: (NOTE) The eGFR has been calculated using the CKD EPI equation. This calculation has not been validated in all clinical situations. eGFR's persistently <60 mL/min signify possible Chronic Kidney Disease.   . Anion gap 06/12/2015 16* 5 - 15 Final  . Alcohol, Ethyl (B) 06/12/2015  237* <5 mg/dL Final   Comment:        LOWEST DETECTABLE LIMIT FOR SERUM ALCOHOL IS 5 mg/dL FOR MEDICAL PURPOSES ONLY   . Color, Urine 06/12/2015 YELLOW  YELLOW Final  . APPearance 06/12/2015 CLOUDY* CLEAR Final  . Specific Gravity, Urine 06/12/2015 1.008  1.005 - 1.030 Final  . pH 06/12/2015 6.0  5.0 - 8.0 Final  . Glucose, UA 06/12/2015 NEGATIVE  NEGATIVE mg/dL Final  . Hgb urine dipstick 06/12/2015 NEGATIVE  NEGATIVE Final  . Bilirubin Urine 06/12/2015 NEGATIVE  NEGATIVE Final  . Ketones, ur 06/12/2015 NEGATIVE  NEGATIVE mg/dL Final  . Protein, ur 06/12/2015 NEGATIVE  NEGATIVE mg/dL Final  . Urobilinogen, UA 06/12/2015 1.0  0.0 - 1.0 mg/dL Final  . Nitrite 06/12/2015 NEGATIVE  NEGATIVE Final  . Leukocytes, UA 06/12/2015 NEGATIVE  NEGATIVE Final   MICROSCOPIC NOT DONE ON URINES WITH NEGATIVE PROTEIN, BLOOD, LEUKOCYTES, NITRITE, OR GLUCOSE <1000 mg/dL.    No results found.   Assessment/Plan   ICD-9-CM ICD-10-CM   1. Alcohol withdrawal delirium (HCC) 291.0 F10.231   2. Orthostatic hypotension 458.0 I95.1   3. Acute cystitis without hematuria 595.0 N30.00   4. Parkinson's disease ([place]) 332.0 G20   5. Ectopic atrial tachycardia (HCC) 427.89 I47.1   6. Essential hypertension 401.9 I10   7. Malnutrition of moderate degree 263.0 E44.0     Check BMP in 3-4 days  ceftin 574m po BID x 5 days  Cont other meds as ordered  PT/OT/ST as ordered  Cont nutritional supplement as ordered  GOAL: short term rehab and d/c home when medically appropriate. Communicated with pt and nursing.  Will follow  Jaleigh Mccroskey S. Perlie Gold  [facility] [address] [place], [place] [address] [phone] Cell (Monday-Friday 8 AM - 5 PM) [phone] After 5 PM and follow prompts

## 2016-02-14 ENCOUNTER — Other Ambulatory Visit: Payer: Self-pay | Admitting: Adult Health

## 2016-03-27 ENCOUNTER — Other Ambulatory Visit: Payer: Self-pay | Admitting: Adult Health

## 2016-04-11 ENCOUNTER — Encounter: Payer: Self-pay | Admitting: Neurology

## 2016-04-11 ENCOUNTER — Other Ambulatory Visit (INDEPENDENT_AMBULATORY_CARE_PROVIDER_SITE_OTHER): Payer: Medicare Other

## 2016-04-11 ENCOUNTER — Ambulatory Visit (INDEPENDENT_AMBULATORY_CARE_PROVIDER_SITE_OTHER): Payer: Medicare Other | Admitting: Neurology

## 2016-04-11 VITALS — BP 140/70 | HR 75 | Ht 69.0 in | Wt 156.4 lb

## 2016-04-11 DIAGNOSIS — G621 Alcoholic polyneuropathy: Secondary | ICD-10-CM

## 2016-04-11 DIAGNOSIS — R278 Other lack of coordination: Secondary | ICD-10-CM

## 2016-04-11 DIAGNOSIS — R4189 Other symptoms and signs involving cognitive functions and awareness: Secondary | ICD-10-CM | POA: Diagnosis not present

## 2016-04-11 LAB — FOLATE

## 2016-04-11 LAB — VITAMIN B12: VITAMIN B 12: 403 pg/mL (ref 211–911)
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
## 2016-04-11 NOTE — Patient Instructions (Signed)
Discontinue sinemet Neuropsychological testing Check blood work  Return to clinic in 2 months  [place] in the [place] are common, often dreaded events in the lives of older people. Aside from the obvious injuries and even death that may result, falls can cause wide-ranging consequences including loss of independence, mental decline, decreased activity, and mobility. Younger people are also at risk of falling, especially those with chronic illnesses and fatigue.  Ways to reduce the risk for falling:  * Examine diet and medications. Warm foods and alcohol dilate blood vessels, which can lead to dizziness when standing. Sleep aids, antidepressants, and pain medications can also increase the likelihood of a fall.  * Get a vison exam. Poor vision, cataracts, and glaucoma increase the chances of falling.  * Check foot gear. Shoes should fit snugly and have a sturdy, nonskid sole and broad, low heel.  * Participate in a physician-approved exercise program to build and maintain muscle strength and improve balance and coordination.  * Increase vitamin D intake. Vitamin D improves muscle strength and increases the amount of calcium the body is able to absorb and deposit in bones.  How to prevent falls from common hazards:  * Floors - Remove all loose wires, cords, and throw rugs. Minimize clutter. Make sure rugs are anchored and smooth. Keep furniture in its usual place.  * Chairs - Use chairs with straight backs, armrests, and firm seats. Add firm cushions to existing pieces to add height.  * Bathroom - Install grab bars and non-skid tape in the tub or shower. Use a bathtub transfer bench or a shower chair with a back support. Use an elevated toilet seat and/or safety rails to assist standing from a low surface. Do not use towel racks or bathroom tissue holders to help you stand.  * Lighting - Make sure halls, stairways, and entrances are well-lit. Install a night light in  your bathroom or hallway. Make sure there is a light switch at the top and bottom of the staircase. Turn lights on if you get up in the middle of the night. Make sure lamps or light switches are within reach of the bed if you have to get up during the night.  * Kitchen - Install non-skid rubber mats near the sink and stove. Clean spills immediately. Store frequently used utensils, pots, and pans between waist and eye level. This helps prevent reaching and bending. Sit when getting things out of the lower cupboards.  * Living room / [place] furniture with wide spaces in between, giving enough room to move around. Establish a route through the living room that gives you something to hold onto as you walk.  * Stairs - Make sure treads, rails, and rugs are secure. Install a rail on both sides of the stairs. If stairs are a threat, it might be helpful to arrange most of your activities on the lower level to reduce the number of times you must climb the stairs.  * Entrances and doorways - Install metal handles on the walls adjacent to the doorknobs of all doors to make it more secure as you travel through the doorway.  Tips for maintaining balance:  * Keep at least one hand free at all times Try using a backpack or fanny pack to hold things rather than carrying them in your hands. Never carry objects in both hands when walking as this interferes with keeping your balance.  * Attempt to swing both  arms from front to back while walking. This might require a conscious effort if Parkinson's disease has diminished your movement. It will, however, help you to maintain balance and posture, and reduce fatigue.  * Consciously lift your feet off the ground when walking. Shuffling and dragging of the feet is a common culprit in losing your balance.  * When trying to navigate turns, use a "U" technique of facing forward and making a wide turn, rather than pivoting sharply.  * Try to stand with your feet  shoulder-length apart. When your feet are close together for any length of time, you increase your risk of losing your balance and falling.  * Do one thing at a time. Do not try to walk and accomplish another task, such as reading or looking around. The decrease in your automatic reflexes complicates motor function, so the less distraction, the better.  * Do not wear rubber or gripping soled shoes, they might "catch" on the floor and cause tripping.  * Move slowly when changing positions. Use deliberate, concentrated movements and, if needed, use a grab bar or walking aid. Count fifteen (15) seconds after standing to begin walking.  * If balance is a continuous problem, you might want to consider a walking aid such as a cane, walking stick, or walker. Once you have mastered walking with help, you may be ready to try it again on your own.  This information is provided by [facility] and is not intended to replace the medical advice of your physician or other health care providers. Please consult your physician or other health care providers for advice regarding your specific medical condition.

## 2016-04-12 LAB — COPPER, SERUM: Copper: 111 ug/dL (ref 72–166)

## 2016-04-12 NOTE — Progress Notes (Signed)
Note routed

## 2016-04-15 LAB — VITAMIN B1: VITAMIN B1 (THIAMINE): 91 nmol/L — AB (ref 8–30)

## 2016-04-16 ENCOUNTER — Encounter: Payer: Self-pay | Admitting: *Deleted

## 2016-05-07 ENCOUNTER — Ambulatory Visit (INDEPENDENT_AMBULATORY_CARE_PROVIDER_SITE_OTHER): Payer: Medicare Other | Admitting: Psychology

## 2016-05-07 DIAGNOSIS — R4189 Other symptoms and signs involving cognitive functions and awareness: Secondary | ICD-10-CM | POA: Diagnosis not present

## 2016-05-08 ENCOUNTER — Encounter: Payer: Self-pay | Admitting: Psychology

## 2016-05-08 NOTE — Progress Notes (Signed)
NEUROPSYCHOLOGICAL INTERVIEW (CPT: [id number])  Name: Victor Clarke Date of Birth: 1929-10-18 Date of Interview: 05/08/2016  Reason for Referral:  Victor Clarke is a 82 y.o., widowed male who is referred for neuropsychological evaluation by Dr. Narda Amber of [facility] due to concerns about memory loss and possible dementia. He was unaccompanied to the current appointment.  History of Presenting Problem:  The patient has previously seen Dr. Jaynee Eagles of [facility] for cognitive dysfunction ([place] was 17/30 in 01/2016) and mild parkinsonism. He disagreed with Dr. Cathren Laine assessment and saw Dr. Posey Pronto in May 2017 for a second opinion. The patient had been prescribed Sinemet by Dr. Jaynee Eagles, and this was stopped. Today the patient reports no difference since stopping the medication. He denied any experience of tremor. The patient reported that he disagrees with the decision of the [facility] and his doctors to revoke his driver's license. He reported that he is "totally committed to having [his] license reinstated".  During today's interview, Victor Clarke admitted that he has noticed some mild changes in memory but he feels these are normal for his age and do not impact his ability to manage his daily tasks and affairs. He reported some difficulty with "long-term" memory including recalling dates when things occurred. However, he denied difficulties with attention, concentration, expressive or receptive language, and visual spatial skills. He has not been driving since December when he reportedly was advised to stop driving by his primary care physician. However, he denied any difficulties with directions or getting lost prior to that time. The patient is currently living in an assisted living facility. He has been there since February of this year. He does still on his own home and is able to visit his home to manage affairs when needed. His medications are managed and dispensed by the  facility. They also provide transportation to appointments. The patient continues to manage all of his finances and bills. He reported significant stress related to money he owes the IRS. He stated that he disagrees with their assessment. He has paid a $60,000 penalty. However, he is taking steps to appeal this.  Medical records report a history of alcohol abuse. The patient reported during our interview today that he "totally refutes any proneness to alcoholism". He reported that he has not had any alcohol since December 2016. Prior to that, he reported that he had "a few martinis" a night. He reported that he never drank outside of his own home. He later stated, "I guess you could call it an addiction but it never caused any accidents."  The patient reported a history of falls. He reported having 2 falls last year. He denied having any falls this year. He uses a cane for walking "only because I was told to". The patient reported sleep difficulties characterized by problems with sleep onset. He believes he does attain enough sleep on a nightly basis. He denies any change or problem with appetite. With regard to mood, the patient reported that he did not know how to characterize this. He reported that he is "consumed" by his appeal to the IRS. He noted having a recent verbal altercation with fellow residents at his assisted living facility. The patient reported that he does not enjoy or have interested in socializing with others. He enjoys reading and spends most of his time doing this. The patient denied any psychiatric history. He denied history of depression or anxiety. He denied past or present suicidal ideation or intention. He reported that  he has never been treated for mental health condition.   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 Marital history: Widowed. The  patient was married to his wife for 49 years. She passed away 5 years ago. He does not have any children. The only people he maintains contact with our his brother in [place] and a niece in [place]. Alcohol/Tobacco/Substances: See above. Patient denies history of substance abuse or dependence. He is a former smoker.  Medical History: Past Medical History  Diagnosis Date  . Hypertension   . Hypercholesteremia   . Gout   . COPD (chronic obstructive pulmonary disease) ([place])   . Detached retina     a. s/p surgical correction on the right.  . Prostate cancer ([place])     a. s/p TURP.  Marland Kitchen Ectopic atrial tachycardia ([place])     a. 08/2014 Echo: EF 55-60%, mildly dil LA.  . Parkinson's disease ([place])   . Left inguinal hernia     a. s/p repair ~ 10 yrs ago.      Current Medications:  Outpatient Encounter Prescriptions as of 05/07/2016  Medication Sig  . allopurinol (ZYLOPRIM) 100 MG tablet Take 100 mg by mouth daily.   Marland Kitchen aspirin EC 81 MG tablet Take 81 mg by mouth daily.  Marland Kitchen atorvastatin (LIPITOR) 40 MG tablet Take 40 mg by mouth daily.  . ciclopirox (PENLAC) 8 % solution   . diltiazem (CARDIZEM CD) 120 MG 24 hr capsule Take 1 capsule (120 mg total) by mouth daily.  . feeding supplement, ENSURE ENLIVE, (ENSURE ENLIVE) LIQD Take 237 mLs by mouth 3 (three) times daily between meals.  . folic acid (FOLVITE) 1 MG tablet Take 1 tablet (1 mg total) by mouth daily.  Marland Kitchen ketoconazole (NIZORAL) 2 % cream   . lisinopril (PRINIVIL,ZESTRIL) 5 MG tablet Take 1 tablet (5 mg total) by mouth daily.  . Omega-3 Fatty Acids (FISH OIL) 1200 MG CAPS Take 2,400 mg by mouth daily.  Marland Kitchen thiamine 100 MG tablet Take 1 tablet (100 mg total) by mouth daily.   No facility-administered encounter medications on file as of 05/07/2016.     Behavioral Observations:   Appearance: Very neatly dressed and groomed. No tremor observed. Gait: Ambulated with a cane. No gait abnormalities observed. Speech: Fluent; normal rate,  rhythm and volume Thought process nonlinear at times  Affect: Full, irritable regarding unfairness he perceives in his current situation.  Interpersonal: Pleasant, appropriate, warmed over the course of the interview and was able to build a nice rapport   TESTING: There is medical necessity to proceed with neuropsychological assessment as the results will be used to aid in differential diagnosis and clinical decision-making and to inform specific treatment recommendations. Per the patient and medical records reviewed, there has been a change in cognitive functioning and a reasonable suspicion of dementia.   PLAN: The patient will return for a full battery of neuropsychological testing with a psychometrician under my supervision. Education regarding testing procedures was provided. Subsequently, the patient will see this provider for a follow-up session at which time his test performances and my impressions and treatment recommendations will be reviewed in detail.   Full neuropsychological evaluation report to follow.

## 2016-05-09 ENCOUNTER — Ambulatory Visit (INDEPENDENT_AMBULATORY_CARE_PROVIDER_SITE_OTHER): Payer: Medicare Other | Admitting: Psychology

## 2016-05-09 DIAGNOSIS — R4189 Other symptoms and signs involving cognitive functions and awareness: Secondary | ICD-10-CM | POA: Diagnosis not present

## 2016-05-28 ENCOUNTER — Ambulatory Visit (INDEPENDENT_AMBULATORY_CARE_PROVIDER_SITE_OTHER): Payer: Medicare Other | Admitting: Psychology

## 2016-05-28 DIAGNOSIS — R413 Other amnesia: Secondary | ICD-10-CM | POA: Diagnosis not present

## 2016-05-28 DIAGNOSIS — F1021 Alcohol dependence, in remission: Secondary | ICD-10-CM

## 2016-05-28 NOTE — Progress Notes (Signed)
NEUROPSYCHOLOGICAL EVALUATION   Name:    Victor Clarke  Date of Birth:   [birth year] Date of Interview:  05/07/2016 Date of Testing:  05/09/2016   Date of Feedback:  05/28/2016      Background Information:  Reason for Referral:  Victor Clarke is a 82 y.o. male referred by Dr. Narda Amber to assess his current level of cognitive functioning and assist in differential diagnosis. The current evaluation consisted of a review of available medical records, an interview with the patient, and the completion of a neuropsychological testing battery. Informed consent was obtained.  History of Presenting Problem:  The patient has previously seen Dr. Jaynee Eagles of [facility] for cognitive dysfunction (Victor Clarke was 17/30 in 01/2016) and mild parkinsonism. He disagreed with Dr. Cathren Laine assessment and saw Dr. Posey Pronto in May 2017 for a second opinion. The patient had been prescribed Sinemet by Dr. Jaynee Eagles, and this was stopped. Today the patient reports no difference since stopping the medication. He denied any experience of tremor. The patient reported that he disagrees with the decision of the [facility] and his doctors to revoke his driver's license. He reported that he is "totally committed to having [his] license reinstated".  During today's interview, Victor Clarke admitted that he has noticed some mild changes in memory but he feels these are normal for his age and do not impact his ability to manage his daily tasks and affairs. He reported some difficulty with "long-term" memory including recalling dates when things occurred. However, he denied difficulties with attention, concentration, expressive or receptive language, and visual spatial skills. He has not been driving since December when he reportedly was advised to stop driving by his primary care physician. However, he denied any difficulties with directions or getting lost prior to that time. The patient is currently living in an assisted living  facility. He has been there since February of this year. He does still on his own home and is able to visit his home to manage affairs when needed. His medications are managed and dispensed by the facility. They also provide transportation to appointments. The patient continues to manage all of his finances and bills. He reported significant stress related to money he owes the IRS. He stated that he disagrees with their assessment. He has paid a $60,000 penalty. However, he is taking steps to appeal this.  Medical records report a history of alcohol abuse. The patient reported during our interview today that he "totally refutes any proneness to alcoholism". He reported that he has not had any alcohol since December 2016. Prior to that, he reported that he had "a few martinis" a night. He reported that he never drank outside of his own home. He later stated, "I guess you could call it an addiction but it never caused any accidents."  The patient reported a history of falls. He reported having 2 falls last year. He denied having any falls this year. He uses a cane for walking "only because I was told to". The patient reported sleep difficulties characterized by problems with sleep onset. He believes he does attain enough sleep on a nightly basis. He denies any change or problem with appetite. With regard to mood, the patient reported that he did not know how to characterize this. He reported that he is "consumed" by his appeal to the IRS. He noted having a recent verbal altercation with fellow residents at his assisted living facility. The patient reported that he does not enjoy or  have interested in socializing with others. He enjoys reading and spends most of his time doing this. The patient denied any psychiatric history. He denied history of depression or anxiety. He denied past or present suicidal ideation or intention. He reported that he has never been treated for mental health condition.   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 Marital history: Widowed. The patient was married to his wife for 51 years. She passed away 5 years ago. He does not have any children. The only people he maintains contact with our his brother in [place] and a niece in [place]. Alcohol/Tobacco/Substances: See above. Patient denies history of substance abuse or dependence. He is a former smoker.   Medical History:  Past Medical History  Diagnosis Date  . Hypertension   . Hypercholesteremia   . Gout   . COPD (chronic obstructive pulmonary disease) ([place])   . Detached retina     a. s/p surgical correction on the right.  . Prostate cancer ([place])     a. s/p TURP.  Marland Kitchen Ectopic atrial tachycardia ([place])     a. 08/2014 Echo: EF 55-60%, mildly dil LA.  . Parkinson's disease ([place])   . Left inguinal hernia     a. s/p repair ~ 10 yrs ago.    Current medications:  Outpatient Encounter Prescriptions as of 05/28/2016  Medication Sig  . allopurinol (ZYLOPRIM) 100 MG tablet Take 100 mg by mouth daily.   Marland Kitchen aspirin EC 81 MG tablet Take 81 mg by mouth daily.  Marland Kitchen atorvastatin (LIPITOR) 40 MG tablet Take 40 mg by mouth daily.  . ciclopirox (PENLAC) 8 % solution   . diltiazem (CARDIZEM CD) 120 MG 24 hr capsule Take 1 capsule (120 mg total) by mouth daily.  . feeding supplement, ENSURE ENLIVE, (ENSURE ENLIVE) LIQD Take 237 mLs by mouth 3 (three) times daily between meals.  . folic acid (FOLVITE) 1 MG tablet Take 1 tablet (1 mg total) by mouth daily.  Marland Kitchen ketoconazole (NIZORAL) 2 % cream   . lisinopril (PRINIVIL,ZESTRIL) 5 MG tablet Take 1 tablet (5 mg total) by mouth daily.  . Omega-3 Fatty Acids (FISH OIL) 1200 MG CAPS Take 2,400 mg by mouth daily.  Marland Kitchen thiamine 100 MG tablet Take 1 tablet (100 mg total) by mouth daily.   No facility-administered encounter medications on file as of  05/28/2016.     Current Examination:  Behavioral Observations:   Appearance: Very neatly dressed and groomed. No tremor observed. Gait: Ambulated with a cane. No gait abnormalities observed. Speech: Fluent; normal rate, rhythm and volume Thought process: nonlinear at times  Affect: Full, irritable regarding unfairness he perceives in his current situation.  Interpersonal: Pleasant, appropriate, warmed over the course of the interview and was able to build a nice rapport Orientation: Oriented to person, place and most aspects of time (accurately reported date, year and day, but inaccurately reported month as May).   Tests Administered: . Test of Premorbid Functioning (TOPF) . Wechsler Adult Intelligence Scale-Fourth Edition (WAIS-IV): Similarities, Matrix Reasoning, and Digit Span Forwards and Backwards subtests . Repeatable Battery for the Assessment of Neuropsychological Status (RBANS) Form A:   . Neuropsychological Assessment Battery (NAB) Language Module, Form 1: Auditory Comprehension, Naming, Reading Comprehension, and Bill Payment Subtests . Controlled Oral Word Association Test (COWAT) . Trail Making Test A and B . Clock drawing test . Geriatric Depression Scale (GDS) 15 Item .  Generalized Anxiety Disorder - 7 item screener (GAD-7)  Test Results: Note: Standardized scores are presented only for use by appropriately trained professionals and to allow for any future test-retest comparison. These scores should not be interpreted without consideration of all the information that is contained in the rest of the report. The most recent standardization samples from the test publisher or other sources were used whenever possible to derive standard scores; scores were corrected for age, gender, ethnicity and education when available.   Test Scores:  Test Name Standardized Score Descriptor  TOPF SS= 108 Average  WAIS-IV Subtests    Similarities ss= 11 Average  Matrix Reasoning ss= 7  Low average  Digit Span Forward ss= 8 Low end of average  Digit Span Backward ss= 11 Average  RBANS Subtests    List Learning Z= -2.27 Impaired  Story Memory Z= -1.62 Borderline  Figure Copy Z= 0.35 Average  Line Orientation Z= 0.88 High average  Picture Naming Z= 0.90 High average  Semantic Fluency Z= -0.38 Average  Digit Span Z= 0.82 High average  Coding Z= -1.18 Low average  List Recall Z= -1.70 Borderline  List Recognition Z= -4.14 Severely impaired  Story Recall Z= -1.57 Borderline  Figure Recall Z= -1.07 Low average  RBANS Index Scores    Immediate Memory SS=65 Impaired  Visualspatial/Constructional SS=112 High average  Language SS=99 Average  Attention SS=97 Average  Delayed Memory SS=64 Impaired  Total Scale SS=83 Low Average  NAB Language subtests    Auditory Comprehension T= 39 Low average  Naming T= 43 Average  Reading Comprehension 50%ile Average  Bill Payment T= 43 Average  COWAT-FAS T= 75 Very superior  COWAT-Animals T= 37 Low average  Trail Making Test A 0 errors T=52 Average  Trail Making Test B 2 errors T= 35 Borderline  Clock Drawing  WNL   GDS-15 6/15 Mild   GAD-7 7/21 Mild                     Description of Test Results: Premorbid verbal intellectual abilities were estimated to have been within the average to high average range based on a test of word reading. Psychomotor processing speed was low average to average. Auditory attention and working memory were average. Visual-spatial abilities were high average. Language abilities were variable but generally within normal limits. Confrontation naming was average, and semantic verbal fluency ranged from low average to average. Auditory comprehension was low average. Reading comprehension was intact. On a task measuring the patient's language abilities on an everyday task (i.e., bill payment), he performed within the average range. With regard to verbal memory, encoding and acquisition of  non-contextual information (i.e., word list) was impaired across four learning trials. After a delay, he could not recall any of the items from the list. His performance on a yes/no recognition task was severely impaired. On another verbal memory test, encoding and acquisition of contextual auditory information (i.e., short story) was borderline impaired across two learning trials. After a delay, free recall was borderline impaired. With regard to non-verbal memory, delayed free recall of visual information was low average. Executive functioning was somewhat variable. Mental flexibility and set-shifting were borderline  impaired; he was able to complete Trails B but he did require increased time to complete the task, and he committed two set-loss errors. Verbal fluency with phonemic search restrictions was very superior and an area of clear strength. Verbal abstract reasoning was average, and non-verbal abstract reasoning was low average. Performance on a  clock drawing task was within normal limits. On self-report questionnaires, the patient's responses were indicative of clinically significant depression and anxiety, both in the mild range.  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 Results of the current evaluation reveal prominent deficits in verbal memory, at the levels of both encoding and consolidation. Meanwhile, other aspects of cognitive functioning are generally within normal limits for his age. He did demonstrate difficulty with mental flexibility and set-shifting on Trails B, but other measures of executive functioning were intact. Overall, these results are suggestive of mild cognitive impairment given that they are representative of deficits in one domain of cognition (i.e., memory), and given that there is a lack of clear evidence that his cognitive deficits are interfering with his functional abilities. I suspect his  functional decline could have been secondary to a combination of memory difficulties and alcohol abuse. He is no longer drinking and therefore may be functioning a bit better in his everyday life.  However, given the prominent deficits in memory encoding and consolidation, these test results may reflect prodromal Alzheimer's disease. Additionally, the patient is endorsing some mild depression and anxiety which appear situational or perhaps related to longstanding personality features.  Recommendations/Plan: Based on the findings of the present evaluation, the following recommendations are offered:  1. Victor Clarke may be an appropriate candidate for cholinesterase inhibitor therapy. He will follow up with Dr. Posey Pronto about this. 2. The patient appears to be doing better with increased support from his assisted living facility. Ongoing assistance with medications, appointments and meals is recommended. Regarding driving, I am unclear if his driving privileges were revoked secondary to cognitive impairment or alcohol abuse. I would recommend an on-road driving evaluation to better characterize his functional driving ability. It also seems that the patient is having difficulty managing complex finances (e.g., he reported that he is currently in a dispute with the IRS over tax filings), and he should continue using services of his accountant in these types of matters. 3. The patient should be encouraged to continue abstinence from alcohol use. This will reduce the risk of alcohol related dementia and reduce the risk of falls and other adverse consequences. 4. Education regarding amnestic mild cognitive impairment and the possibility of Alzheimer's disease will be provided to the patient. Written information regarding the same will be provided.  5. The patient should continue to participate in activities which provide mental stimulation, cardiovascular exercise, and social interaction. I think his mood would  benefit tremendously from these types of activities. Additionally, he could consider engaging in psychotherapy for adjustment related depression/anxiety. (He will consider this latter recommendation and let Dr. Posey Pronto know if he wants a referral to [facility].) 6. Neuropsychological re-assessment in one year is recommended in order to monitor cognitive status, track progression of symptoms and further assist with treatment planning.   Feedback to Patient: Victor Clarke returned for a feedback appointment on 05/28/2016 to review the results of his neuropsychological evaluation with this provider. 45 minutes face-to-face time was spent reviewing his test results, my impressions and my recommendations as detailed above.    Total time spent on this patient's case: [id number] unit for interview with psychologist; [phone] units of testing by psychometrician under psychologist's supervision; [phone] units for medical record review, administration and scoring of neuropsychological tests, interpretation of test results, preparation of this report, and review of results to the patient by psychologist.      Thank you for your  referral of Victor Clarke. Please feel free to contact me if you have any questions or concerns regarding this report.

## 2016-05-28 NOTE — Patient Instructions (Signed)
Results of your cognitive evaluation were largely within normal limits for your age. However, you did have significant difficulty with learning and remembering new verbal information. A diagnosis of amnestic Mild Cognitive Impairment (MCI) is appropriate at this time. I have given you written information on MCI.   I have the following recommendations: -Follow up with Dr. Posey Pronto regarding medication for cognitive impairment. -Continue to receive support from assisted living facility with appointments, meals, and medications.  -Continue to work with your accountant on complex financial affairs. -Continue abstinence from alcohol use, as alcohol can cause alcohol related dementia and increase risk of falls. -Participate in activities that provide mental stimulation, social interaction, and safe cardiovascular exercise. These are shown to reduce the risk of cognitive decline and also improve mood. -Consider counseling for mild depression/anxiety. -Return for neuropsychological re-evaluation in approx one year.

## 2016-06-27 ENCOUNTER — Ambulatory Visit (INDEPENDENT_AMBULATORY_CARE_PROVIDER_SITE_OTHER): Payer: Medicare Other | Admitting: Neurology

## 2016-06-27 ENCOUNTER — Encounter: Payer: Self-pay | Admitting: Neurology

## 2016-06-27 VITALS — BP 130/80 | Ht 69.0 in | Wt 165.4 lb

## 2016-06-27 DIAGNOSIS — R413 Other amnesia: Secondary | ICD-10-CM | POA: Diagnosis not present

## 2016-06-27 DIAGNOSIS — G621 Alcoholic polyneuropathy: Secondary | ICD-10-CM

## 2016-06-27 NOTE — Patient Instructions (Addendum)
Return to clinic in 6 months.
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
## 2016-06-28 NOTE — Progress Notes (Signed)
   Neuropsychology Note  Victor Clarke returned on 05/09/2016 for 2 hours of neuropsychological testing with technician, Milana Kidney, BS, under the supervision of Dr. Macarthur Critchley. The patient did not appear overtly distressed by the testing session, per behavioral observation or via self-report to the technician. Rest breaks were offered. [name] will return within 2 weeks for a feedback session with Dr. Si Raider at which time his test performances, clinical impressions and treatment recommendations will be reviewed in detail. The patient understands he can contact our office should he require our assistance before this time.  Full report to follow.

## 2016-07-04 NOTE — Progress Notes (Signed)
   Neuropsychology Note  Victor Clarke returned today for 2 hours of neuropsychological testing with technician, Milana Kidney, BS, under the supervision of Dr. Macarthur Critchley. The patient did not appear overtly distressed by the testing session, per behavioral observation or via self-report to the technician. Rest breaks were offered. Victor Clarke will return within 2 weeks for a feedback session with Dr. Si Raider at which time his test performances, clinical impressions and treatment recommendations will be reviewed in detail. The patient understands he can contact our office should he require our assistance before this time.  Full report to follow.

## 2016-07-18 ENCOUNTER — Ambulatory Visit: Payer: Medicare Other | Admitting: Neurology

## 2016-09-27 ENCOUNTER — Ambulatory Visit: Payer: Medicare Other | Admitting: Family

## 2016-10-22 ENCOUNTER — Encounter: Payer: Self-pay | Admitting: Family

## 2016-10-22 ENCOUNTER — Other Ambulatory Visit (INDEPENDENT_AMBULATORY_CARE_PROVIDER_SITE_OTHER): Payer: Medicare Other

## 2016-10-22 ENCOUNTER — Ambulatory Visit (INDEPENDENT_AMBULATORY_CARE_PROVIDER_SITE_OTHER): Payer: Medicare Other | Admitting: Family

## 2016-10-22 VITALS — BP 140/80 | HR 67 | Temp 97.8°F | Resp 16 | Ht 69.0 in | Wt 174.0 lb

## 2016-10-22 DIAGNOSIS — I10 Essential (primary) hypertension: Secondary | ICD-10-CM

## 2016-10-22 DIAGNOSIS — R413 Other amnesia: Secondary | ICD-10-CM | POA: Insufficient documentation

## 2016-10-22 DIAGNOSIS — E7800 Pure hypercholesterolemia, unspecified: Secondary | ICD-10-CM

## 2016-10-22 LAB — COMPREHENSIVE METABOLIC PANEL
ALT: 12 U/L (ref 0–53)
AST: 18 U/L (ref 0–37)
Albumin: 4.4 g/dL (ref 3.5–5.2)
Alkaline Phosphatase: 70 U/L (ref 39–117)
BILIRUBIN TOTAL: 1 mg/dL (ref 0.2–1.2)
BUN: 21 mg/dL (ref 6–23)
CALCIUM: 10 mg/dL (ref 8.4–10.5)
CHLORIDE: 104 meq/L (ref 96–112)
CO2: 26 meq/L (ref 19–32)
CREATININE: 1.32 mg/dL (ref 0.40–1.50)
GFR: 54.74 mL/min — ABNORMAL LOW (ref >60.00)
Glucose, Bld: 113 mg/dL — ABNORMAL HIGH (ref 70–99)
Potassium: 4.3 mEq/L (ref 3.5–5.1)
SODIUM: 140 meq/L (ref 135–145)
Total Protein: 8 g/dL (ref 6.0–8.3)

## 2016-10-22 LAB — LIPID PANEL
CHOL/HDL RATIO: 4
Cholesterol: 119 mg/dL (ref 0–200)
HDL: 29.4 mg/dL — ABNORMAL LOW (ref >39.00)
LDL CALC: 62 mg/dL (ref 0–99)
NONHDL: 89.97
TRIGLYCERIDES: 142 mg/dL (ref 0.0–149.0)
VLDL: 28.4 mg/dL (ref 0.0–40.0)

## 2016-10-22 NOTE — Assessment & Plan Note (Signed)
Mild cognitive impairment with possible early Alzheimer's disease per most recent neurology testing. Patient requesting DMV forms to be completed. Discussed with patient agreement to have driver rehabilitation/testing from [facility] and to discuss further driving release per neurology.

## 2016-10-22 NOTE — Assessment & Plan Note (Signed)
Hypertension appears adequately controlled with current regimen and no adverse side effects or hypotensive readings. No worst headache of life with no symptoms of end organ damage noted upon exam. Obtain CMET for therapeutic drug monitoring. Continue to monitor blood pressure at home and follow low sodium diet. Continue current dosage of lisinopril.

## 2016-10-22 NOTE — Progress Notes (Signed)
Subjective:    Patient ID: Victor Clarke, male    DOB: March 17, 1929, 82 y.o.   MRN: [id number]  Chief Complaint  Patient presents with  . Establish Care    trying to get drivers license renewed feels like he was misdiagnosed with parkinsons    HPI:  Victor Clarke is a 82 y.o. male who  has a past medical history of COPD (chronic obstructive pulmonary disease) ([place]); Detached retina; Ectopic atrial tachycardia ([place]); Gout; Hypercholesteremia; Hypertension; Left inguinal hernia; Parkinson's disease ([place]); and Prostate cancer ([place]). and presents today for an office visit to establish care.   1.) Drivers License - Patient expresses concern regarding misdiagnosis of Parkinson's disease and his inability to drive. Expresses that he has been a safe driver and never had an accident. He has recently been evaluated by neurology with no evidence of Parkinson's disease that can be found as well as a mild dementia with concern for Alzheimer's disease. Per notes it was not recommended that he drive with consideration for possible driver's rehabilitation which the patient indicates is too expensive and feels that he does not need it.   2.) Hypertension - Currently maintained on lisinopril and diltiazem. Reports taking his blood pressures at home and averaging around 969G systolic. Takes his medications as prescribed and denies adverse side effects. Denies worst headache of life and no new symptoms of end organ damage.  BP Readings from Last 3 Encounters:  10/22/16 140/80  06/27/16 130/80  04/11/16 140/70    3.) Hyperlipidemia - Currently maintained on atorvastatin. Reports taking the medication as prescribed and denies adverse side effects or myalgias. Does express the occasional cramping.  Lab Results  Component Value Date   CHOL 119 10/22/2016   HDL 29.40 (L) 10/22/2016   LDLCALC 62 10/22/2016   TRIG 142.0 10/22/2016   CHOLHDL 4 10/22/2016     No Known Allergies    Outpatient  Medications Prior to Visit  Medication Sig Dispense Refill  . allopurinol (ZYLOPRIM) 100 MG tablet Take 100 mg by mouth daily.     Mar[place] Kitchen aspirin EC 81 MG tablet Take 81 mg by mouth daily.    Marland Kitchen atorvastatin (LIPITOR) 40 MG tablet Take 40 mg by mouth daily.    . ciclopirox (PENLAC) 8 % solution     . diltiazem (CARDIZEM CD) 120 MG 24 hr capsule Take 1 capsule (120 mg total) by mouth daily. 30 capsule 2  . feeding supplement, ENSURE ENLIVE, (ENSURE ENLIVE) LIQD Take 237 mLs by mouth 3 (three) times daily between meals. 493 mL 12  . folic acid (FOLVITE) 1 MG tablet Take 1 tablet (1 mg total) by mouth daily.    Marland Kitchen ketoconazole (NIZORAL) 2 % cream     . lisinopril (PRINIVIL,ZESTRIL) 5 MG tablet Take 1 tablet (5 mg total) by mouth daily. 90 tablet 3  . Omega-3 Fatty Acids (FISH OIL) 1200 MG CAPS Take 2,400 mg by mouth daily.    Marland Kitchen thiamine 100 MG tablet Take 1 tablet (100 mg total) by mouth daily.     No facility-administered medications prior to visit.      Past Medical History:  Diagnosis Date  . COPD (chronic obstructive pulmonary disease) ([place])   . Detached retina    a. s/p surgical correction on the right.  . Ectopic atrial tachycardia ([place])    a. 08/2014 Echo: EF 55-60%, mildly dil LA.  Marland Kitchen Gout   . Hypercholesteremia   . Hypertension   . Left inguinal hernia  a. s/p repair ~ 10 yrs ago.  . Parkinson's disease ([place])   . Prostate cancer ([place])    a. s/p TURP.      Past Surgical History:  Procedure Laterality Date  . COLONOSCOPY W/ POLYPECTOMY  2001, 2006  . HERNIA REPAIR    . PROSTATE SURGERY        Family History  Problem Relation Age of Onset  . Stroke Father     died @ 25.  [place] Kitchen Heart attack Mother     died @ 24  . Stroke Brother     died in his 77's  . Heart attack Brother     died in his 35's      Social History   Social History  . Marital status: Widowed    Spouse name: N/A  . Number of children: 0  . Years of education: College   Occupational History  .  Retired Other   Social History Main Topics  . Smoking status: Former Smoker    Packs/day: 2.00    Years: 22.00    Types: Cigarettes    Quit date: 11/18/1977  . Smokeless tobacco: Never Used  . Alcohol use No     Comment: None currently since previous December.   . Drug use: No  . Sexual activity: No   Other Topics Concern  . Not on file   Social History Narrative   Patient lives at an assisted living facility.  Has no children.     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     Education: college degree.   Caffeine Use: 1 cup daily    Review of Systems  Constitutional: Negative for chills and fever.  Eyes:       Negative for changes in vision  Respiratory: Negative for cough, chest tightness and wheezing.   Cardiovascular: Negative for chest pain, palpitations and leg swelling.  Neurological: Negative for dizziness, weakness and light-headedness.       Objective:    BP 140/80   Pulse 67   Temp 97.8 F (36.6 C) (Oral)   Resp 16   Ht '5\' 9"'  (1.753 m)   Wt 174 lb (78.9 kg)   SpO2 94%   BMI 25.70 kg/m  Nursing note and vital signs reviewed.  Physical Exam  Constitutional: He is oriented to person, place, and time. He appears well-developed and well-nourished. No distress.  Cardiovascular: Normal rate, regular rhythm, normal heart sounds and intact distal pulses.   Pulmonary/Chest: Effort normal and breath sounds normal.  Neurological: He is alert and oriented to person, place, and time.  Skin: Skin is warm and dry.  Psychiatric: He has a normal mood and affect. His behavior is normal. Judgment and thought content normal.        Assessment & Plan:   Problem List Items Addressed This Visit      Cardiovascular and Mediastinum   Hypertension - Primary    Hypertension appears adequately controlled with current regimen and no adverse side effects or hypotensive readings. No worst headache of life with no symptoms of end organ damage noted upon exam.  Obtain CMET for therapeutic drug monitoring. Continue to monitor blood pressure at home and follow low sodium diet. Continue current dosage of lisinopril.       Relevant Orders   Lipid panel (Completed)   Comp Met (CMET) (Completed)     Other   Hypercholesteremia    Obtain lipid profile to check current status and CMET for  liver function test. Continue current dosage of atorvastatin pending lipid profile results.       Relevant Orders   Lipid panel (Completed)   Comp Met (CMET) (Completed)   Memory loss    Mild cognitive impairment with possible early Alzheimer's disease per most recent neurology testing. Patient requesting DMV forms to be completed. Discussed with patient agreement to have driver rehabilitation/testing from [facility] and to discuss further driving release per neurology.          I am having Mr. Enneking maintain his allopurinol, atorvastatin, aspirin EC, Fish Oil, diltiazem, feeding supplement (ENSURE ENLIVE), lisinopril, folic acid, thiamine, ketoconazole, and ciclopirox.   Follow-up: Return in about 2 months (around 12/23/2016), or if symptoms worsen or fail to improve.  Mauricio Po, FNP

## 2016-10-22 NOTE — Assessment & Plan Note (Signed)
Obtain lipid profile to check current status and CMET for liver function test. Continue current dosage of atorvastatin pending lipid profile results.

## 2016-10-22 NOTE — Patient Instructions (Addendum)
Thank you for choosing [facility].  SUMMARY AND INSTRUCTIONS:  Please follow up with Dr. Posey Pronto regarding your Drivers' Rehab Course and Form Completion.   Medication:  Please continue to take your medication as prescribed.   Labs:  Please stop by the lab on the lower level of the building for your blood work. Your results will be released to [facility] (or called to you) after review, usually within 72 hours after test completion. If any changes need to be made, you will be notified at that same time.  1.) The lab is open from 7:30am to 5:30 pm Monday-Friday 2.) No appointment is necessary 3.) Fasting (if needed) is 6-8 hours after food and drink; black coffee and water are okay   Follow up:  If your symptoms worsen or fail to improve, please contact our office for further instruction, or in case of emergency go directly to the emergency room at the closest medical facility.

## 2016-11-26 ENCOUNTER — Encounter: Payer: Self-pay | Admitting: Neurology

## 2016-11-26 ENCOUNTER — Ambulatory Visit (INDEPENDENT_AMBULATORY_CARE_PROVIDER_SITE_OTHER): Payer: Medicare Other | Admitting: Neurology

## 2016-11-26 VITALS — BP 140/80 | HR 79 | Ht 69.0 in | Wt 169.0 lb

## 2016-11-26 DIAGNOSIS — G621 Alcoholic polyneuropathy: Secondary | ICD-10-CM | POA: Diagnosis not present

## 2016-11-26 DIAGNOSIS — R4189 Other symptoms and signs involving cognitive functions and awareness: Secondary | ICD-10-CM | POA: Diagnosis not present

## 2016-11-26 NOTE — Addendum Note (Signed)
Addended by: Chester Holstein on: 11/26/2016 03:49 PM   Modules accepted: Orders
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
## 2016-11-26 NOTE — Patient Instructions (Signed)
We will send a referral to [place] Return to clinic in 6 months

## 2017-01-01 ENCOUNTER — Ambulatory Visit: Payer: Medicare Other | Admitting: Neurology

## 2017-01-07 ENCOUNTER — Encounter: Payer: Self-pay | Admitting: Gastroenterology

## 2017-01-21 ENCOUNTER — Encounter: Payer: Self-pay | Admitting: Gastroenterology

## 2017-01-21 ENCOUNTER — Other Ambulatory Visit (INDEPENDENT_AMBULATORY_CARE_PROVIDER_SITE_OTHER): Payer: Medicare Other

## 2017-01-21 ENCOUNTER — Ambulatory Visit (INDEPENDENT_AMBULATORY_CARE_PROVIDER_SITE_OTHER): Payer: Medicare Other | Admitting: Gastroenterology

## 2017-01-21 ENCOUNTER — Encounter (INDEPENDENT_AMBULATORY_CARE_PROVIDER_SITE_OTHER): Payer: Self-pay

## 2017-01-21 VITALS — BP 124/70 | HR 76 | Ht 69.0 in | Wt 173.0 lb

## 2017-01-21 DIAGNOSIS — K625 Hemorrhage of anus and rectum: Secondary | ICD-10-CM | POA: Diagnosis not present

## 2017-01-21 LAB — CBC WITH DIFFERENTIAL/PLATELET
BASOS PCT: 1.1 % (ref 0.0–3.0)
Basophils Absolute: 0.1 10*3/uL (ref 0.0–0.1)
EOS PCT: 2.4 % (ref 0.0–5.0)
Eosinophils Absolute: 0.2 10*3/uL (ref 0.0–0.7)
HCT: 43.7 % (ref 39.0–52.0)
HEMOGLOBIN: 14.7 g/dL (ref 13.0–17.0)
Lymphocytes Relative: 24.5 % (ref 12.0–46.0)
Lymphs Abs: 1.8 10*3/uL (ref 0.7–4.0)
MCHC: 33.7 g/dL (ref 30.0–36.0)
MCV: 89.6 fl (ref 78.0–100.0)
MONO ABS: 0.7 10*3/uL (ref 0.1–1.0)
MONOS PCT: 9.5 % (ref 3.0–12.0)
Neutro Abs: 4.5 10*3/uL (ref 1.4–7.7)
Neutrophils Relative %: 62.5 % (ref 43.0–77.0)
Platelets: 253 10*3/uL (ref 150.0–400.0)
RBC: 4.87 Mil/uL (ref 4.22–5.81)
RDW: 15.7 % — AB (ref 11.5–15.5)
WBC: 7.2 10*3/uL (ref 4.0–10.5)

## 2017-01-21 MED ORDER — NA SULFATE-K SULFATE-MG SULF 17.5-3.13-1.6 GM/177ML PO SOLN
1.0000 | Freq: Once | ORAL | 0 refills | Status: DC
Start: 2017-01-21 — End: 2017-01-22

## 2017-01-21 NOTE — Patient Instructions (Signed)
If you are age 81 or older, your body mass index should be between 23-30. Your Body mass index is 25.55 kg/m. If this is out of the aforementioned range listed, please consider follow up with your Primary Care Provider.  If you are age 5 or younger, your body mass index should be between 19-25. Your Body mass index is 25.55 kg/m. If this is out of the aformentioned range listed, please consider follow up with your Primary Care Provider.   You have been scheduled for a colonoscopy. Please follow written instructions given to you at your visit today.  Please pick up your prep supplies at the pharmacy within the next 1-3 days. If you use inhalers (even only as needed), please bring them with you on the day of your procedure.  Thank you for choosing [facility]  Dr Wilfrid Lund III

## 2017-01-21 NOTE — Progress Notes (Signed)
[facility] Consult Note:  History: Victor Clarke 01/21/2017  Referring physician: Mauricio Po, [place]  Reason for consult/chief complaint: Rectal Bleeding (BRB, onset x 3 weeks consistantly, with and without bowel movements, feels like "an extension of the intestine " is coming out)   Subjective  HPI:  This is an 83 year old man referred by primary care for rectal bleeding. He reports that it has been going on about 3 weeks, both with and without bowel movements. At times there will be a small "smears" of blood on his diaper. At times he has a small amount of solid fecal material that we'll "slide out" after a bowel movement. He also feels that perhaps there is a fullness or a protrusion at times after he is cleaning from a bowel movement. He denies abdominal pain, his bowel habits are reportedly regular and he denies weight loss. Beyond that, it is difficult to get a helpful history or review of systems since he is quite upset and ruminative the recent revocation of his driving privileges. He is upset that one of his providers cancel his driving privileges stating that he was an alcoholic, which he denies. He asked me several times whether I might make a determination or opinion in that regard. I politely declined to do so.  Mr. Gardella last colonoscopy a report available is in 2001, when an adenomatous polyp was removed. He appears to have had an encounter in the endoscopy department with Dr. Earle Gell in 2011, but no report can be located.  ROS:  Review of Systems  Constitutional: Negative for appetite change and unexpected weight change.  HENT: Negative for mouth sores and voice change.   Eyes: Negative for pain and redness.  Respiratory: Negative for cough and shortness of breath.   Cardiovascular: Negative for chest pain and palpitations.  Genitourinary: Negative for dysuria and hematuria.  Musculoskeletal: Positive for arthralgias. Negative for myalgias.   Skin: Negative for pallor and rash.  Neurological: Negative for weakness and headaches.  Hematological: Negative for adenopathy.     Past Medical History: Past Medical History:  Diagnosis Date  . Arthritis   . COPD (chronic obstructive pulmonary disease) ([place])   . Detached retina    a. s/p surgical correction on the right.  . Ectopic atrial tachycardia ([place])    a. 08/2014 Echo: EF 55-60%, mildly dil LA.  Marland Kitchen Gout   . Hypercholesteremia   . Hypertension   . Left inguinal hernia    a. s/p repair ~ 10 yrs ago.  . Parkinson's disease ([place])   . Prostate cancer ([place])    a. s/p TURP.     Past Surgical History: Past Surgical History:  Procedure Laterality Date  . COLONOSCOPY W/ POLYPECTOMY  2001, 2006  . HERNIA REPAIR    . PROSTATE SURGERY       Family History: Family History  Problem Relation Age of Onset  . Stroke Father     died @ 31.  Marland Kitchen Heart attack Mother     died @ 62  . Stroke Brother     died in his 27's  . Heart attack Brother     died in his 28's  . Colon cancer Neg Hx     Social History: Social History   Social History  . Marital status: Widowed    Spouse name: N/A  . Number of children: 0  . Years of education: College   Occupational History  . Retired Other   Social History Main Topics  .  Smoking status: Former Smoker    Packs/day: 2.00    Years: 22.00    Types: Cigarettes    Quit date: 11/18/1977  . Smokeless tobacco: Never Used  . Alcohol use No     Comment: None currently since previous December.   . Drug use: No  . Sexual activity: No   Other Topics Concern  . None   Social History Narrative   Patient lives at an assisted living facility.  Has no children.     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     Education: college degree.   Caffeine Use: 1 cup daily    Allergies: No Known Allergies  Outpatient Meds: Current Outpatient Prescriptions  Medication Sig Dispense Refill  . allopurinol (ZYLOPRIM) 100 MG  tablet Take 100 mg by mouth daily.     Marland Kitchen aspirin EC 81 MG tablet Take 81 mg by mouth daily.    Marland Kitchen atorvastatin (LIPITOR) 40 MG tablet Take 40 mg by mouth daily.    . ciclopirox (PENLAC) 8 % solution     . diltiazem (CARDIZEM CD) 120 MG 24 hr capsule Take 1 capsule (120 mg total) by mouth daily. 30 capsule 2  . feeding supplement, ENSURE ENLIVE, (ENSURE ENLIVE) LIQD Take 237 mLs by mouth 3 (three) times daily between meals. 737 mL 12  . folic acid (FOLVITE) 1 MG tablet Take 1 tablet (1 mg total) by mouth daily.    Marland Kitchen ketoconazole (NIZORAL) 2 % cream     . lisinopril (PRINIVIL,ZESTRIL) 5 MG tablet Take 1 tablet (5 mg total) by mouth daily. 90 tablet 3  . Omega-3 Fatty Acids (FISH OIL) 1200 MG CAPS Take 2,400 mg by mouth daily.    Marland Kitchen thiamine 100 MG tablet Take 1 tablet (100 mg total) by mouth daily.    . Na Sulfate-K Sulfate-Mg Sulf 17.5-3.13-1.6 GM/180ML SOLN Take 1 kit by mouth once. 354 mL 0   No current facility-administered medications for this visit.       ___________________________________________________________________ Objective   Exam:  BP 124/70   Pulse 76   Ht '5\' 9"'  (1.753 m)   Wt 173 lb (78.5 kg)   BMI 25.55 kg/m    General: this is a(n) Well-appearing elderly man, good muscle mass, modestly overweight, somewhat restricted affect   Eyes: sclera anicteric, no redness  ENT: oral mucosa moist without lesions, no cervical or supraclavicular lymphadenopathy, good dentition  CV: RRR without murmur, S1/S2, no JVD, no peripheral edema  Resp: clear to auscultation bilaterally, normal RR and effort noted  GI: soft, no tenderness, with active bowel sounds. No guarding or palpable organomegaly noted.  Skin; warm and dry, no rash or jaundice noted  Neuro: awake, alert and oriented x 3. Normal gross motor function and fluent speech Rectal: Mildly decreased sphincter tone, no palpable internal lesions, no fissure or tenderness Prostate enlarged and soft  Labs:  CBC Latest  Ref Rng & Units 11/18/2015 11/17/2015 11/16/2015  WBC 4.0 - 10.5 K/uL 5.6 7.2 6.5  Hemoglobin 13.0 - 17.0 g/dL 11.6(L) 10.7(L) 10.8(L)  Hematocrit 39.0 - 52.0 % 34.2(L) 33.1(L) 33.4(L)  Platelets 150 - 400 K/uL 376 291 278   No recent CBC   Assessment: Encounter Diagnosis  Name Primary?  . Rectal bleeding Yes    Possibly hemorrhoidal bleeding, but has been many years since his last colonoscopy. I advised him to have a colonoscopy in the next couple of weeks to find a source of bleeding and determine what  if any intervention can/should be taken. He was upset to learn at that procedure could not be performed today, since that was his expectation. I explained to him the necessary scheduling as well as diet and preparation requirements for colonoscopy. He is agreeable after discussion of the procedure and risks.  The benefits and risks of the planned procedure were described in detail with the patient or (when appropriate) their health care proxy.  Risks were outlined as including, but not limited to, bleeding, infection, perforation, adverse medication reaction leading to cardiac or pulmonary decompensation, or pancreatitis (if ERCP).  The limitation of incomplete mucosal visualization was also discussed.  No guarantees or warranties were given.   We'll be sure to fax his prescription for bowel preparation as well as the instructions to his assisted living facility.  CBC today   Thank you for the courtesy of this consult.  Please call me with any questions or concerns.  Nelida Meuse III  CC: Mauricio Po, FNP

## 2017-01-22 ENCOUNTER — Other Ambulatory Visit: Payer: Self-pay

## 2017-01-22 ENCOUNTER — Telehealth: Payer: Self-pay | Admitting: Gastroenterology

## 2017-01-22 NOTE — Telephone Encounter (Signed)
Suprep order was cancelled. [facility].

## 2017-01-29 ENCOUNTER — Telehealth: Payer: Self-pay

## 2017-01-29 NOTE — Telephone Encounter (Signed)
Left a message to return call as Juliann Pulse was unavailable. Asked them to have her leave the preferred pharmacy for a new RX to go to.

## 2017-01-29 NOTE — Telephone Encounter (Signed)
Malachi Pro a nurse [facility] is calling on behalf of the pt. She states the pharmacy contacted them to let them know that the suprep will not make in time for the pt. She would like to speak to someone about getting it sent to another pharmacy or discuss other options. Contact number [phone].

## 2017-01-30 ENCOUNTER — Other Ambulatory Visit: Payer: Self-pay

## 2017-01-30 MED ORDER — NA SULFATE-K SULFATE-MG SULF 17.5-3.13-1.6 GM/177ML PO SOLN: 1.0000 | Freq: Once | ORAL | 0 refills | Status: AC

## 2017-01-30 NOTE — Telephone Encounter (Signed)
Spoke to [place], she asked for the bowel prep RX be faxed to the nursing home where the pt currently resides. suprep precription faxed as requested.

## 2017-02-03 ENCOUNTER — Encounter: Payer: Self-pay | Admitting: Gastroenterology

## 2017-02-03 ENCOUNTER — Ambulatory Visit (AMBULATORY_SURGERY_CENTER): Payer: Medicare Other | Admitting: Gastroenterology

## 2017-02-03 ENCOUNTER — Telehealth: Payer: Self-pay | Admitting: Gastroenterology

## 2017-02-03 VITALS — BP 111/59 | HR 61 | Temp 99.3°F | Resp 14 | Ht 69.0 in | Wt 173.0 lb

## 2017-02-03 DIAGNOSIS — K625 Hemorrhage of anus and rectum: Secondary | ICD-10-CM

## 2017-02-03 DIAGNOSIS — D122 Benign neoplasm of ascending colon: Secondary | ICD-10-CM

## 2017-02-03 DIAGNOSIS — K639 Disease of intestine, unspecified: Secondary | ICD-10-CM

## 2017-02-03 MED ORDER — SODIUM CHLORIDE 0.9 % IV SOLN: 500.0000 mL | INTRAVENOUS | Status: DC

## 2017-02-03 NOTE — Patient Instructions (Signed)
YOU HAD AN ENDOSCOPIC PROCEDURE TODAY AT THE [facility]:   Refer to the procedure report that was given to you for any specific questions about what was found during the examination.  If the procedure report does not answer your questions, please call your gastroenterologist to clarify.  If you requested that your care partner not be given the details of your procedure findings, then the procedure report has been included in a sealed envelope for you to review at your convenience later.  YOU SHOULD EXPECT: Some feelings of bloating in the abdomen. Passage of more gas than usual.  Walking can help get rid of the air that was put into your GI tract during the procedure and reduce the bloating. If you had a lower endoscopy (such as a colonoscopy or flexible sigmoidoscopy) you may notice spotting of blood in your stool or on the toilet paper. If you underwent a bowel prep for your procedure, you may not have a normal bowel movement for a few days.  Please Note:  You might notice some irritation and congestion in your nose or some drainage.  This is from the oxygen used during your procedure.  There is no need for concern and it should clear up in a day or so.  SYMPTOMS TO REPORT IMMEDIATELY:   Following lower endoscopy (colonoscopy or flexible sigmoidoscopy):  Excessive amounts of blood in the stool  Significant tenderness or worsening of abdominal pains  Swelling of the abdomen that is new, acute  Fever of 100F or higher    For urgent or emergent issues, a gastroenterologist can be reached at any hour by calling [phone].   DIET:  We do recommend a small meal at first, but then you may proceed to your regular diet.  Drink plenty of fluids but you should avoid alcoholic beverages for 24 hours.  ACTIVITY:  You should plan to take it easy for the rest of today and you should NOT DRIVE or use heavy machinery until tomorrow (because of the sedation medicines used during the test).     FOLLOW UP: Our staff will call the number listed on your records the next business day following your procedure to check on you and address any questions or concerns that you may have regarding the information given to you following your procedure. If we do not reach you, we will leave a message.  However, if you are feeling well and you are not experiencing any problems, there is no need to return our call.  We will assume that you have returned to your regular daily activities without incident.  If any biopsies were taken you will be contacted by phone or by letter within the next 1-3 weeks.  Please call us at [phone] if you have not heard about the biopsies in 3 weeks.    SIGNATURES/CONFIDENTIALITY: You and/or your care partner have signed paperwork which will be entered into your electronic medical record.  These signatures attest to the fact that that the information above on your After Visit Summary has been reviewed and is understood.  Full responsibility of the confidentiality of this discharge information lies with you and/or your care-partner.   Resume medications. Information given on polyps,diverticulosis,hemorrhoids and high fiber diet. 

## 2017-02-03 NOTE — Telephone Encounter (Signed)
Spoke with Levada Dy who cares for the patient. She confirmed that he took all of his prep last night.  He has been on clear liquids only. She is checking with the patient to see what his last BM looked like, and she will call me back.  Levada Dy called back, and the patient stated that his bm was clear.   We will proceed with the colonoscopy.
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
## 2017-02-03 NOTE — Progress Notes (Signed)
Report given to PACU, vss 

## 2017-02-03 NOTE — Progress Notes (Signed)
Called to room to assist during endoscopic procedure.  Patient ID and intended procedure confirmed with present staff. Received instructions for my participation in the procedure from the performing physician.  

## 2017-02-03 NOTE — Op Note (Signed)
[place] Patient Name: Victor Clarke Procedure Date: 02/03/2017 3:24 PM MRN: [id number] Endoscopist: Mallie Mussel L. Loletha Carrow , MD Age: 83 Referring MD:  Date of Birth: 1929/04/06 Gender: Male Account #: [id number] Procedure:                Colonoscopy Indications:              Rectal bleeding, Fecal incontinence Medicines:                Monitored Anesthesia Care Procedure:                Pre-Anesthesia Assessment:                           - Prior to the procedure, a History and Physical                            was performed, and patient medications and                            allergies were reviewed. The patient's tolerance of                            previous anesthesia was also reviewed. The risks                            and benefits of the procedure and the sedation                            options and risks were discussed with the patient.                            All questions were answered, and informed consent                            was obtained. Anticoagulants: The patient has taken                            aspirin. It was decided not to withhold this                            medication prior to the procedure. ASA Grade                            Assessment: III - A patient with severe systemic                            disease. After reviewing the risks and benefits,                            the patient was deemed in satisfactory condition to                            undergo the procedure.  After obtaining informed consent, the colonoscope                            was passed under direct vision. Throughout the                            procedure, the patient's blood pressure, pulse, and                            oxygen saturations were monitored continuously. The                            Model CF-HQ190L [id number]) scope was introduced                            through the anus and advanced to the the cecum,                             identified by appendiceal orifice and ileocecal                            valve. The colonoscopy was performed without                            difficulty. The patient tolerated the procedure                            well. The quality of the bowel preparation was                            poor. The ileocecal valve, appendiceal orifice, and                            rectum were photographed. The quality of the bowel                            preparation was evaluated using the BBPS [facility]                            Bowel Preparation Scale) with scores of: Right                            Colon = 1, Transverse Colon = 1 and Left Colon = 2.                            The total BBPS score equals 4. It is not clear that                            preparation instructions were followed properly.                            The bowel preparation used was SUPREP. Scope In: 3:28:56 PM Scope Out: 3:43:28 PM Scope Withdrawal  Time: 0 hours 10 minutes 46 seconds  Total Procedure Duration: 0 hours 14 minutes 32 seconds  Findings:                 The perianal exam findings include an anterior skin                            tag/redundant anal tissue.                           The digital rectal exam findings include decreased                            sphincter tone.                           A 4 mm polyp was found in the mid ascending colon.                            The polyp was sessile. The polyp was removed with a                            cold snare. Resection and retrieval were complete.                           Multiple small-mouthed diverticula were found in                            the left colon.                           Retroflexion in the rectum was not performed due to                            anatomy.                           The exam was otherwise without abnormality.                           An area of moderately congested mucosa was found in                             the sigmoid colon. This was biopsied with a cold                            forceps for histology.                           Internal hemorrhoids were found during anoscopy.                            The hemorrhoids were small and Grade I (internal                            hemorrhoids that do not  prolapse). Complications:            No immediate complications. Estimated Blood Loss:     Estimated blood loss: none. Impression:               - Preparation of the colon was poor.                           - Perianal skin tag found on perianal exam.                           - Decreased sphincter tone found on digital rectal                            exam.                           - One 4 mm polyp in the mid ascending colon,                            removed with a cold snare. Resected and retrieved.                           - Diverticulosis in the left colon.                           - The examination was otherwise normal.                           - Congested mucosa in the sigmoid colon. Biopsied.                           - Internal hemorrhoids.                           Decreased sphincter tone explains patient's                            reported fecal incontinence. He is also having                            blood "staining" from internal hemorrhoids. Fiber                            agent such as metamucil recommended to add bulk to                            stool and decrease incidence of incontinence. Recommendation:           - Patient has a contact number available for                            emergencies. The signs and symptoms of potential                            delayed complications were discussed with the  patient. Return to normal activities tomorrow.                            Written discharge instructions were provided to the                            patient.                           - Resume previous  diet.                           - Continue present medications.                           - No repeat colonoscopy due to age. Henry L. Loletha Carrow, MD 02/03/2017 3:54:18 PM This report has been signed electronically.

## 2017-02-04 ENCOUNTER — Telehealth: Payer: Self-pay | Admitting: *Deleted

## 2017-02-04 NOTE — Telephone Encounter (Signed)
  Follow up Call-  Call back number 02/03/2017  Post procedure Call Back phone  # [phone]  Some recent data might be hidden     Patient questions:  Do you have a fever, pain , or abdominal swelling? No. Pain Score  0 *  Have you tolerated food without any problems? Yes.    Have you been able to return to your normal activities? Yes.    Do you have any questions about your discharge instructions: Diet   No. Medications  No. Follow up visit  No.  Do you have questions or concerns about your Care? No.  Actions: * If pain score is 4 or above: No action needed, pain <4.

## 2017-02-06 ENCOUNTER — Encounter: Payer: Self-pay | Admitting: Gastroenterology

## 2017-02-18 ENCOUNTER — Telehealth: Payer: Self-pay | Admitting: Gastroenterology

## 2017-02-18 NOTE — Telephone Encounter (Signed)
Spoke to patient, he is concerned that he is still having small amount of rectal bleeding as described in Dr. Loletha Carrow' note on 01/21/17. Patient states "nothing has changed" since being seen. Patient denies any hemorhoids, I did let him know that upon colonoscopy internal hemorrhoids were visualized. Patient is quite insistent that he was not told about the hemorrhoids, although it does state this and recommends a high fiber diet on his discharge instructions. Offered to call patient back after consulting with the doctor, he was not interested in this. He was not satisfied that Dr. Loletha Carrow' first available appointment was not until 5/3. I offered him several times an appointment with APP's, he finally did take appointment for 4/11.

## 2017-02-18 NOTE — Telephone Encounter (Signed)
Telephone call sent to me as doctor of the day Agree that scan/small amount rectal bleeding likely related to internal hemorrhoids is seen at very recent colonoscopy Agree with high fiber diet Can try over-the-counter Preparation H suppositories daily at bedtime for internal hemorrhoids until visit with advanced practitioner on 02/26/2017

## 2017-02-19 NOTE — Telephone Encounter (Signed)
Patient advised of recommendations and will try the suppositories at bedtime. Also recommended a high fiber diet.

## 2017-02-20 ENCOUNTER — Telehealth: Payer: Self-pay | Admitting: Neurology

## 2017-02-20 NOTE — Telephone Encounter (Signed)
Caller: Driving Instructor  Urgent? Yes  Reason for the call: She called regarding this patient and his driving assessment. He will be receiving a medical report to bring to the office tho have Dr. Posey Pronto fill out and then fax back. She has done the Clinical assessment but cannot get on the road until the paperwork is back. Thanks

## 2017-02-20 NOTE — Telephone Encounter (Signed)
FYI

## 2017-02-24 ENCOUNTER — Telehealth: Payer: Self-pay | Admitting: Neurology

## 2017-02-24 NOTE — Telephone Encounter (Signed)
Thanks for letting me know.  It is clear to me from my interactions with Victor Clarke that he has difficulty with his memory.  I agree with the above plan.

## 2017-02-24 NOTE — Telephone Encounter (Signed)
PT called and wanted to know if you have received anything from DMV/[place]

## 2017-02-25 NOTE — Telephone Encounter (Signed)
I spoke with patient and he seemed very confused about the next step to take about his driving.  Informed him that the Victor Clarke rehab letter that we received states that he needs to go back to do the actual driving test.  He said that he would call them to see if he needs to set that up.

## 2017-02-26 ENCOUNTER — Ambulatory Visit: Payer: Medicare Other | Admitting: Physician Assistant

## 2017-02-26 ENCOUNTER — Telehealth: Payer: Self-pay | Admitting: Neurology

## 2017-02-26 NOTE — Telephone Encounter (Signed)
Nees something by 2/35 for license

## 2017-02-26 NOTE — Telephone Encounter (Signed)
Caller: Victor Clarke  Urgent? No  Reason for the call: Please call. He needs to speak with you regarding driving and has some questions for you. Thanks

## 2017-02-26 NOTE — Telephone Encounter (Signed)
Let pt know that Victor Clarke is not going to write a note to release him back to driving. Advised that he would have to follow up with neurology in regards. Pt understood.

## 2017-02-26 NOTE — Telephone Encounter (Signed)
documentation fax to his residence [phone] [facility] (Attn Suanne Marker ro [place] Merl)

## 2017-02-26 NOTE — Telephone Encounter (Signed)
Pt called stating his license was taken away in December, and would like to know if [place] can providd some sort of documentation to state he can still drive so he can get his license reinstated. Please call back.

## 2017-02-27 ENCOUNTER — Telehealth: Payer: Self-pay | Admitting: Neurology

## 2017-02-27 NOTE — Telephone Encounter (Signed)
Do you want to see him sooner?  His PCP's CMA was advised to tell him to follow up with neurology.

## 2017-02-27 NOTE — Telephone Encounter (Signed)
Caller: PT  Urgent? No  Reason for the call: PT wants to discuss coming in sooner so he can get updated with DMV

## 2017-02-27 NOTE — Telephone Encounter (Addendum)
That would be fine.  The earliest appointment I have is 4/24 at 3:30pm.  He should send the forms to me prior to his appointment to complete.   Donika K. Posey Pronto, DO

## 2017-02-28 NOTE — Telephone Encounter (Signed)
Patient will either drop off forms or mail them to [place].  He will take the 03-11-17 appointment but would like to be put on the waiting list.

## 2017-03-04 ENCOUNTER — Ambulatory Visit (INDEPENDENT_AMBULATORY_CARE_PROVIDER_SITE_OTHER): Payer: Medicare Other | Admitting: Physician Assistant

## 2017-03-04 ENCOUNTER — Telehealth: Payer: Self-pay | Admitting: Neurology

## 2017-03-04 ENCOUNTER — Encounter: Payer: Self-pay | Admitting: Physician Assistant

## 2017-03-04 VITALS — BP 130/70 | HR 75 | Ht 69.0 in | Wt 173.0 lb

## 2017-03-04 DIAGNOSIS — K648 Other hemorrhoids: Secondary | ICD-10-CM

## 2017-03-04 DIAGNOSIS — K625 Hemorrhage of anus and rectum: Secondary | ICD-10-CM

## 2017-03-04 DIAGNOSIS — K5730 Diverticulosis of large intestine without perforation or abscess without bleeding: Secondary | ICD-10-CM | POA: Diagnosis not present

## 2017-03-04 NOTE — Telephone Encounter (Signed)
VM-PT left a message saying he never received the last visit report that DMV is requesting

## 2017-03-04 NOTE — Patient Instructions (Signed)
Use Preparation H suppositories at bedtime daily for 2 weeks then as needed for rectal bleeding.   Start daily fiber supplements with large glass of water.  Metamucil or Benefiber are good.

## 2017-03-04 NOTE — Progress Notes (Addendum)
Subjective:    Patient ID: Victor Clarke, male    DOB: [birth year], 83 y.o.   MRN: [id number]  HPI Victor Clarke is an 83 year old white male known to [facility] who comes in today for follow-up after recent evaluation for rectal bleeding and fecal soilage. Patient has history of atrial fibrillation, hypertension, alcohol-related peripheral neuropathy, COPD. He is a resident at an assisted living facility. He was seen in the office on 01/21/2017 with complaints of intermittent bright red blood per rectum and also intermittent staining of his underclothes and fecal seepage. He had colonoscopy on 3/19 2018. He was noted to have decreased anal sphincter tone, one 4 mm polyp was removed which was a tubular adenoma, also noted multiple diverticuli and small internal hemorrhoids. He had an area of congested colonic mucosa in the sigmoid colon which was biopsied and biopsies showed no abnormality. Patient comes in today saying that he still seeing bright red blood almost daily on the tissue, generally does not see any blood in his stools or in the commode. He says it's disconcerting to see the blood. He is not recalling much issue with fecal soilage at this time. He has no complaints of abdominal pain. He basically wanted to discuss findings of the colonoscopy.  He said he had picked up some Preparation H suppositories but has not started using them.  Review of Systems Pertinent positive and negative review of systems were noted in the above HPI section.  All other review of systems was otherwise negative.  Outpatient Encounter Prescriptions as of 03/04/2017  Medication Sig  . allopurinol (ZYLOPRIM) 100 MG tablet Take 100 mg by mouth daily.   Marland Kitchen aspirin EC 81 MG tablet Take 81 mg by mouth daily.  Marland Kitchen atorvastatin (LIPITOR) 40 MG tablet Take 40 mg by mouth daily.  . ciclopirox (PENLAC) 8 % solution   . diltiazem (CARDIZEM CD) 120 MG 24 hr capsule Take 1 capsule (120 mg total) by mouth daily.  . feeding supplement,  ENSURE ENLIVE, (ENSURE ENLIVE) LIQD Take 237 mLs by mouth 3 (three) times daily between meals.  . folic acid (FOLVITE) 1 MG tablet Take 1 tablet (1 mg total) by mouth daily.  Marland Kitchen ketoconazole (NIZORAL) 2 % cream   . lisinopril (PRINIVIL,ZESTRIL) 5 MG tablet Take 1 tablet (5 mg total) by mouth daily.  . Omega-3 Fatty Acids (FISH OIL) 1200 MG CAPS Take 2,400 mg by mouth daily.  Marland Kitchen thiamine 100 MG tablet Take 1 tablet (100 mg total) by mouth daily.   Facility-Administered Encounter Medications as of 03/04/2017  Medication  . 0.9 %  sodium chloride infusion   No Known Allergies Patient Active Problem List   Diagnosis Date Noted  . Memory loss 10/22/2016  . Malnutrition of moderate degree 11/15/2015  . Acute respiratory failure with hypoxemia ([place])   . Acute respiratory failure with hypercapnia ([place])   . Acute respiratory failure ([place])   . Alcohol intoxication ([place])   . Altered mental state   . Altered mental status 11/07/2015  . Acute encephalopathy 11/07/2015  . Fall from steps 11/07/2015  . Anemia 11/07/2015  . Alcohol abuse with intoxication ([place]) 11/07/2015  . Thrombocytopenia ([place]) 11/07/2015  . Pyuria 11/07/2015  . Alcohol withdrawal delirium ([place]) 08/08/2015  . Dehydration 08/08/2015  . UTI (urinary tract infection) 08/08/2015  . High anion gap metabolic acidosis 35/45/6256  . Dizziness 07/31/2015  . Orthostatic hypotension 07/31/2015  . Lactic acidosis 07/31/2015  . Ectopic atrial tachycardia ([place])   . Hypercholesteremia   .  Dyspnea on exertion 09/16/2014  . Hypertension 09/16/2014  . New onset a-fib ([place]) 09/16/2014   Social History   Social History  . Marital status: Widowed    Spouse name: N/A  . Number of children: 0  . Years of education: College   Occupational History  . Retired Other   Social History Main Topics  . Smoking status: Former Smoker    Packs/day: 2.00    Years: 22.00    Types: Cigarettes    Quit date: 11/18/1977  . Smokeless tobacco: Never  Used  . Alcohol use No     Comment: None currently since previous December.   . Drug use: No  . Sexual activity: No   Other Topics Concern  . Not on file   Social History Narrative   Patient lives at an assisted living facility.  Has no children.     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     Education: college degree.   Caffeine Use: 1 cup daily    Victor Clarke's family history includes Heart attack in his brother and mother; Stroke in his brother and father.      Objective:    Vitals:   03/04/17 1039  BP: 130/70  Pulse: 75    Physical Exam  Well-developed elderly white male in no acute distress, blood pressure 130/70, pulse 75, BMI 25.5. HEENT; nontraumatic normocephalic EOMI PERRLA sclera anicteric,Cardiovascular ;regular rate and rhyh S1-S2 no murmur or gallop, Pulmonary ;clear bilaterally, Abdomen; soft nontender, nondistended bowel sounds are present no palpable mass or hepatosplenomegaly, Neuropsych; mood and affect appropriate       Assessment & Plan:   #44 83 year old white male with intermittent small-volume hematochezia secondary to internal hemorrhoids documented on recent colonoscopy. #2 diverticulosis #3 mild fecal seepage-secondary to lax anal sphincter tone #4 tubular adenomatous polyp-no follow up due to age  Plan; start daily fiber supplement in form of Metamucil or Benefiber and a large glass of water to bulk stools Start Preparation H suppositories daily at bedtime 2 weeks, then resume when necessary for recurrent small volume bleeding. Discussed the benign nature of internal hemorrhoidal bleeding and reassured him area Patient will follow up with Dr. Loletha Carrow  on an as-needed basis.  Daman Steffenhagen S Excell Neyland PA-C 03/04/2017   Cc: [place], FNP   Thank you for sending this case to me. I have reviewed the entire note, and the outlined plan seems appropriate.  Victor Clarke has difficulty with his memory.  As yo note, we have discussed  this in the past. I am not planning to perform hemorrhoid banding on this patient.  Wilfrid Lund, MD

## 2017-03-05 NOTE — Telephone Encounter (Signed)
Last OV note mailed to patient.

## 2017-03-12 ENCOUNTER — Ambulatory Visit: Payer: Medicare Other | Admitting: Neurology

## 2017-03-31 ENCOUNTER — Telehealth: Payer: Self-pay | Admitting: Neurology

## 2017-03-31 NOTE — Telephone Encounter (Signed)
OT Driving Evaluation rec'd dated 03/26/2017 which showed that he performed exceptionally well on the clinical and behind-the-wheel assessments.  It was recommended that he proceed with independent driving in all traffic situations without restriction and permanent driver's license be re-instated.   Lisbet Busker K. Posey Pronto, DO

## 2017-04-10 ENCOUNTER — Ambulatory Visit (INDEPENDENT_AMBULATORY_CARE_PROVIDER_SITE_OTHER): Payer: Medicare Other | Admitting: Neurology

## 2017-04-10 ENCOUNTER — Encounter: Payer: Self-pay | Admitting: Neurology

## 2017-04-10 VITALS — BP 110/70 | HR 54 | Ht 69.0 in | Wt 174.2 lb

## 2017-04-10 DIAGNOSIS — R4189 Other symptoms and signs involving cognitive functions and awareness: Secondary | ICD-10-CM

## 2017-04-10 DIAGNOSIS — R252 Cramp and spasm: Secondary | ICD-10-CM | POA: Diagnosis not present

## 2017-04-10 DIAGNOSIS — G621 Alcoholic polyneuropathy: Secondary | ICD-10-CM | POA: Diagnosis not present

## 2017-04-10 NOTE — Progress Notes (Signed)
Follow-up Visit   Date: 04/10/17    Victor Clarke MRN: [id number] DOB: 1929/10/26   Interim History: Victor Clarke is a 83 y.o. right-handed Caucasian male with prostate cancer s/p TURP, COPD, gout, hypertension, hyperlipidemia, and alcohol use returning to the clinic for follow-up of dementia and neuropathy.  The patient was accompanied to the clinic by self.  History of present illness: MEMORY He endorses short-term memory loss which has been gradual since 2014, but does not feel that it is anything more than would be expected for his age.  Specifically, he does not agree with his diagnosis of dementia which was given by Dr. Jaynee Eagles and his PCP.  He misplaces paperwork and reports that his filing system is not as great as it used to be.  He denies any difficulty remembering his medications, however his previous office visits with other providers document that he frequently misses his medications.  He was previously cooking and doing all household chores. Although he denies excessive alcohol use, review of Epic shows that he has been hospitalized previously for alcohol intoxication.  He stresses that he has never been arrested or involved in the law because of alcohol.  He admits to drinking 4 oz liquor (martini's) nightly for the last 10 years, but has been sober since December 2016.  He is very upset that his driver's license was cancelled by the [facility]. His wife died in December 26, 2009 and has been living alone since then.  He does not have children.    He suffered a fall in December 2016 and called EMS.  He was admitted for superficial injuries and then discharged to rehab facility and has been in assisted living since February 2017.   PARKINSON'S DISEASE He was diagnosed with Parkinson's disease in the summer of 2016 and started on sinemet 25/100mg  half-tablet three times daily, but does not feel that it has helped him.  He walks independently at home, and uses a cane for long distance.   Review of Dr. Cathren Laine note shows that he has a mild resting tremor which improved with sinemet.  She also documented medication noncompliance when he was living at home.  He denies tremor, constipation, vivid dreams, anosmia.      UPDATE 06/27/2016:  At his last visit, we decided to give patient a drug holiday off sinemet due to his concerned that he does not have the diagnosis.  Since stopping the medication, there has been no new tremor tremor, stiffness, or falls.  There have been no new changes to his memory and his neuropsychological testing showed MCI with possibly early signs of Alzhiemer's disease and recommended repeat testing in 1 year.  He is very concerned about his driving and because his license expired in July, he has not been to the [facility] to have it renewed.  His providers, including myself, have advised him against driving.  UPDATE 11/27/2015:  He is here routine follow-up appointment.  No new complaints today. He denies any tremor, stiffness, or falls. He is upset at my recommendation that he not drive and would like for me to reconsider this.  He called to have a formal driving assessment and due to the cost being >$700, did not have this done.  He is interested in alternative options. Neuropathy remains stable.   UPDATE 04/10/2017:  He is here for follow-up visit.  Since his last visit, he had driving evaluation which he did very well and has his license again.  He manages his own  finances and bills.  His facility prepares his meals, housekeeping services, and manage his medication. He exercises daily, enjoys reading, and reads the newspaper daily.  He has started noticed new muscle cramps of the legs, which often wake him up from sleeping to walk. He denies staying well hydrated and may drink 4 glasses of water daily.    Medications:  Current Outpatient Prescriptions on File Prior to Visit  Medication Sig Dispense Refill  . allopurinol (ZYLOPRIM) 100 MG tablet Take 100 mg by mouth daily.      Marland Kitchen aspirin EC 81 MG tablet Take 81 mg by mouth daily.    Marland Kitchen atorvastatin (LIPITOR) 40 MG tablet Take 40 mg by mouth daily.    Marland Kitchen diltiazem (CARDIZEM CD) 120 MG 24 hr capsule Take 1 capsule (120 mg total) by mouth daily. 30 capsule 2  . feeding supplement, ENSURE ENLIVE, (ENSURE ENLIVE) LIQD Take 237 mLs by mouth 3 (three) times daily between meals. 496 mL 12  . folic acid (FOLVITE) 1 MG tablet Take 1 tablet (1 mg total) by mouth daily.    . Omega-3 Fatty Acids (FISH OIL) 1200 MG CAPS Take 2,400 mg by mouth daily.    Marland Kitchen thiamine 100 MG tablet Take 1 tablet (100 mg total) by mouth daily.     Current Facility-Administered Medications on File Prior to Visit  Medication Dose Route Frequency Provider Last Rate Last Dose  . 0.9 %  sodium chloride infusion  500 mL Intravenous Continuous Danis, Estill Cotta III, MD        Allergies: No Known Allergies  Review of Systems:  CONSTITUTIONAL: No fevers, chills, night sweats, or weight loss.  EYES: No visual changes or eye pain ENT: No hearing changes.  No history of nose bleeds.   RESPIRATORY: No cough, wheezing and shortness of breath.   CARDIOVASCULAR: Negative for chest pain, and palpitations.   GI: Negative for abdominal discomfort, blood in stools or black stools.  No recent change in bowel habits.   GU:  No history of incontinence.   MUSCLOSKELETAL: No history of joint pain or swelling.  No myalgias.   SKIN: Negative for lesions, rash, and itching.   ENDOCRINE: Negative for cold or heat intolerance, polydipsia or goiter.   PSYCH:  No depression or anxiety symptoms.   NEURO: As Above.   Vital Signs:  BP 110/70   Pulse (!) 54   Ht 5\' 9"  (1.753 m)   Wt 174 lb 4 oz (79 kg)   SpO2 92%   BMI 25.73 kg/m   Neurological Exam: MENTAL STATUS including orientation to time, place, person, recent and remote memory, attention span and concentration, language, and fund of knowledge is normal.  Speech is not dysarthric.  CRANIAL NERVES:  Pupils equal  round and reactive to light.  Normal conjugate, extra-ocular eye movements in all directions of gaze.  No ptosis.  Face is symmetric. Palate elevates symmetrically.  Tongue is midline.  MOTOR:  Motor strength is 5/5 in all extremities. No tremor.  No pronator drift.  MSRs:    Reflexes are 2+/4 throughout, except absent at the ankles.  SENSORY:  Absent vibration distal to ankles bilaterally.  COORDINATION/GAIT:   Gait is normal, stable and unassisted.  Data: Labs 04/11/2016:  Folate >23, vitamin B1 91, vitamin B12 403, copper 111  OT Driving Evaluation rec'd dated 03/26/2017 which showed that he performed exceptionally well on the clinical and behind-the-wheel assessments.  It was recommended that he proceed with independent driving in all  traffic situations without restriction and permanent driver's license be re-instated.    IMPRESSION/PLAN: 1.  Mild cognitive impairment, possibly early signs of Alzheimer's disease as per neuropsychiatric evaluation  - Clinically, he is stable with respect to memory changes and denies any new complaints  -  He completed his Driving Evaluation with roadside testing with occupational therapy which he did very well and has since had his driver's license reinstated.  He is okay to resume driving as long as he remains cognitively stable   2.  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  His last thiamine levels were elevated, so ok to discontinue vitamin B1 supplements, especially as he no longer drinks alcohol.  I continued to encourage him to abstain from alcohol.  3.  Muscle cramps.  Encouraged him to stay well hydrated.  He can also try 2-3 glasses of tonic water.   Return to clinic in 1 year   The duration of this appointment visit was 25 minutes of face-to-face time with the patient.  Greater than 50% of this time was spent in counseling, explanation of diagnosis, planning of further management, and coordination  of care.   Thank you for allowing me to participate in patient's care.  If I can answer any additional questions, I would be pleased to do so.    Sincerely,    Donika K. Posey Pronto, DO

## 2017-04-10 NOTE — Patient Instructions (Signed)
OK to stop vitamin B1 OK to resume driving  Return to clinic as needed

## 2017-05-26 ENCOUNTER — Telehealth: Payer: Self-pay | Admitting: Neurology

## 2017-05-26 NOTE — Telephone Encounter (Signed)
Rhonda with the [employer] called and said PT can only see Dr Posey Pronto on a Tuesday or Thursday and wants to know if Dr Posey Pronto will see him on one of those days since it is a testing day CB# [phone]

## 2017-05-26 NOTE — Telephone Encounter (Signed)
Please advise 

## 2017-05-26 NOTE — Telephone Encounter (Signed)
OK to add 9/6 at 11:30am.

## 2017-05-28 ENCOUNTER — Ambulatory Visit: Payer: Medicare Other | Admitting: Neurology

## 2017-07-24 ENCOUNTER — Ambulatory Visit (INDEPENDENT_AMBULATORY_CARE_PROVIDER_SITE_OTHER): Payer: Medicare Other | Admitting: Neurology

## 2017-07-24 ENCOUNTER — Encounter: Payer: Self-pay | Admitting: Neurology

## 2017-07-24 VITALS — BP 150/80 | HR 74 | Ht 69.0 in | Wt 175.3 lb

## 2017-07-24 DIAGNOSIS — G621 Alcoholic polyneuropathy: Secondary | ICD-10-CM

## 2017-07-24 DIAGNOSIS — R4189 Other symptoms and signs involving cognitive functions and awareness: Secondary | ICD-10-CM | POA: Diagnosis not present

## 2017-07-24 NOTE — Progress Notes (Signed)
Follow-up Visit   Date: 07/24/17    Victor Clarke MRN: [id number] DOB: [birth year]   Interim History: Victor Clarke is a 83 y.o. right-handed Caucasian male with prostate cancer s/p TURP, COPD, gout, hypertension, hyperlipidemia, and alcohol use returning to the clinic for follow-up of dementia and neuropathy.  The patient was accompanied to the clinic by self.  History of present illness: MEMORY He endorses short-term memory loss which has been gradual since 2014, but does not feel that it is anything more than would be expected for his age.  Specifically, he does not agree with his diagnosis of dementia which was given by Dr. Jaynee Eagles and his PCP.  He misplaces paperwork and reports that his filing system is not as great as it used to be.  He denies any difficulty remembering his medications, however his previous office visits with other providers document that he frequently misses his medications.  He was previously cooking and doing all household chores. Although he denies excessive alcohol use, review of Epic shows that he has been hospitalized previously for alcohol intoxication.  He stresses that he has never been arrested or involved in the law because of alcohol.  He admits to drinking 4 oz liquor (martini's) nightly for the last 10 years, but has been sober since December 2016.  He is very upset that his driver's license was cancelled by the [facility]. His wife died in 12/20/2009 and has been living alone since then.  He does not have children.    He suffered a fall in December 2016 and called EMS.  He was admitted for superficial injuries and then discharged to rehab facility and has been in assisted living since February 2017.   PARKINSON'S DISEASE He was diagnosed with Parkinson's disease in the summer of 2016 and started on sinemet 25/100mg  half-tablet three times daily, but does not feel that it has helped him.  He walks independently at home, and uses a cane for long distance.   Review of Dr. Cathren Laine note shows that he has a mild resting tremor which improved with sinemet.  She also documented medication noncompliance when he was living at home.  He denies tremor, constipation, vivid dreams, anosmia.      UPDATE 06/27/2016:  At his last visit, we decided to give patient a drug holiday off sinemet due to his concerned that he does not have the diagnosis.  Since stopping the medication, there has been no new tremor tremor, stiffness, or falls.  There have been no new changes to his memory and his neuropsychological testing showed MCI with possibly early signs of Alzhiemer's disease and recommended repeat testing in 1 year.  He is very concerned about his driving and because his license expired in July, he has not been to the [facility] to have it renewed.  His providers, including myself, have advised him against driving.  UPDATE 11/27/2015:  He is here routine follow-up appointment.  No new complaints today. He denies any tremor, stiffness, or falls. He is upset at my recommendation that he not drive and would like for me to reconsider this.  He called to have a formal driving assessment and due to the cost being >$700, did not have this done.  He is interested in alternative options. Neuropathy remains stable.   UPDATE 04/10/2017:  He is here for follow-up visit.  Since his last visit, he had driving evaluation which he did very well and has his license again.  He manages his own  finances and bills.  His facility prepares his meals, housekeeping services, and manage his medication. He exercises daily, enjoys reading, and reads the newspaper daily.  He has started noticed new muscle cramps of the legs, which often wake him up from sleeping to walk. He denies staying well hydrated and may drink 4 glasses of water daily.    UPDATE 07/24/2017:  He is here for follow-up visit.  He complains of skin warts and bleeding after defecating.  He has seen his PCP for these issues, as I explained both these  issues are beyond my realm of expertise.  From a memory standpoint, he is doing well.  He is driving without any safety concerns.  He continues to manage his own finances and has been late a few times.  No new neurological complaints.   Medications:  Current Outpatient Prescriptions on File Prior to Visit  Medication Sig Dispense Refill  . allopurinol (ZYLOPRIM) 100 MG tablet Take 100 mg by mouth daily.     Marland Kitchen aspirin EC 81 MG tablet Take 81 mg by mouth daily.    Marland Kitchen diltiazem (CARDIZEM CD) 120 MG 24 hr capsule Take 1 capsule (120 mg total) by mouth daily. 30 capsule 2  . feeding supplement, ENSURE ENLIVE, (ENSURE ENLIVE) LIQD Take 237 mLs by mouth 3 (three) times daily between meals. 270 mL 12  . folic acid (FOLVITE) 1 MG tablet Take 1 tablet (1 mg total) by mouth daily.    . Multiple Vitamin (MULTIVITAMIN) tablet Take 1 tablet by mouth daily.    . Omega-3 Fatty Acids (FISH OIL) 1200 MG CAPS Take 2,400 mg by mouth daily.     Current Facility-Administered Medications on File Prior to Visit  Medication Dose Route Frequency Provider Last Rate Last Dose  . 0.9 %  sodium chloride infusion  500 mL Intravenous Continuous Danis, Estill Cotta III, MD        Allergies: No Known Allergies  Review of Systems:  CONSTITUTIONAL: No fevers, chills, night sweats, or weight loss.  EYES: No visual changes or eye pain ENT: No hearing changes.  No history of nose bleeds.   RESPIRATORY: No cough, wheezing and shortness of breath.   CARDIOVASCULAR: Negative for chest pain, and palpitations.   GI: Negative for abdominal discomfort, blood in stools or black stools.  No recent change in bowel habits.   GU:  No history of incontinence.   MUSCLOSKELETAL: No history of joint pain or swelling.  No myalgias.   SKIN: Negative for lesions, rash, and itching.   ENDOCRINE: Negative for cold or heat intolerance, polydipsia or goiter.   PSYCH:  No depression or anxiety symptoms.   NEURO: As Above.   Vital Signs:  BP (!)  150/80   Pulse 74   Ht 5\' 9"  (1.753 m)   Wt 175 lb 5 oz (79.5 kg)   SpO2 93%   BMI 25.89 kg/m   Neurological Exam: MENTAL STATUS including orientation to time, place, person is normal. Speech is not dysarthric.  CRANIAL NERVES:  Pupils equal round and reactive to light.  Normal conjugate, extra-ocular eye movements in all directions of gaze.  No ptosis.  Face is symmetric. Palate elevates symmetrically.  Tongue is midline.  MOTOR:  Motor strength is 5/5 throughout. No pronator drift or tremor.  COORDINATION/GAIT:   Gait is normal, stable and unassisted.  Data: Labs 04/11/2016:  Folate >23, vitamin B1 91, vitamin B12 403, copper 111  OT Driving Evaluation rec'd dated 03/26/2017 which showed that he  performed exceptionally well on the clinical and behind-the-wheel assessments.  It was recommended that he proceed with independent driving in all traffic situations without restriction and permanent driver's license be re-instated.    IMPRESSION/PLAN: 1.  Mild cognitive impairment, possibly early signs of Alzheimer's disease as per neuropsychiatric evaluation; clinically stable He completed his Driving Evaluation with roadside testing and had his driver's license reinstated in the summer of 2018. He has no new neurological complaints or home safety issues Encouraged him to stay active both physically and with cognitive exercises  2.  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  3.  Follow-up with PCP regarding skin changes and hemorrhoids   Return to clinic as needed  The duration of this appointment visit was 15 minutes of face-to-face time with the patient.  Greater than 50% of this time was spent in counseling, explanation of diagnosis, planning of further management, and coordination of care.   Thank you for allowing me to participate in patient's care.  If I can answer any additional questions, I would be pleased to do so.    Sincerely,    Ermel Verne  K. Posey Pronto, DO

## 2017-07-24 NOTE — Patient Instructions (Signed)
Return to clinic as needed

## 2018-04-30 ENCOUNTER — Emergency Department (HOSPITAL_COMMUNITY)
Admission: EM | Admit: 2018-04-30 | Discharge: 2018-05-01 | Disposition: A | Discharge status: Home / Self Care | Payer: Medicare Other | Attending: Emergency Medicine | Admitting: Emergency Medicine

## 2018-04-30 ENCOUNTER — Emergency Department (HOSPITAL_COMMUNITY): Payer: Medicare Other

## 2018-04-30 ENCOUNTER — Other Ambulatory Visit: Payer: Self-pay

## 2018-04-30 ENCOUNTER — Encounter (HOSPITAL_COMMUNITY): Payer: Self-pay | Admitting: Radiology

## 2018-04-30 DIAGNOSIS — Z87891 Personal history of nicotine dependence: Secondary | ICD-10-CM | POA: Diagnosis not present

## 2018-04-30 DIAGNOSIS — G20 Parkinson's disease: Secondary | ICD-10-CM | POA: Diagnosis not present

## 2018-04-30 DIAGNOSIS — I10 Essential (primary) hypertension: Secondary | ICD-10-CM | POA: Diagnosis not present

## 2018-04-30 DIAGNOSIS — R55 Syncope and collapse: Secondary | ICD-10-CM | POA: Insufficient documentation

## 2018-04-30 DIAGNOSIS — E860 Dehydration: Secondary | ICD-10-CM | POA: Diagnosis not present

## 2018-04-30 DIAGNOSIS — R42 Dizziness and giddiness: Secondary | ICD-10-CM | POA: Diagnosis present

## 2018-04-30 DIAGNOSIS — Z8546 Personal history of malignant neoplasm of prostate: Secondary | ICD-10-CM | POA: Insufficient documentation

## 2018-04-30 LAB — CBC WITH DIFFERENTIAL/PLATELET
BASOS ABS: 0 10*3/uL (ref 0.0–0.1)
BASOS PCT: 1 %
EOS ABS: 0.1 10*3/uL (ref 0.0–0.7)
EOS PCT: 2 %
HCT: 37.5 % — ABNORMAL LOW (ref 39.0–52.0)
Hemoglobin: 12.7 g/dL — ABNORMAL LOW (ref 13.0–17.0)
Lymphocytes Relative: 23 %
Lymphs Abs: 0.8 10*3/uL (ref 0.7–4.0)
MCH: 32.9 pg (ref 26.0–34.0)
MCHC: 33.9 g/dL (ref 30.0–36.0)
MCV: 97.2 fL (ref 78.0–100.0)
MONO ABS: 0.5 10*3/uL (ref 0.1–1.0)
MONOS PCT: 14 %
Neutro Abs: 2 10*3/uL (ref 1.7–7.7)
Neutrophils Relative %: 60 %
PLATELETS: 180 10*3/uL (ref 150–400)
RBC: 3.86 MIL/uL — ABNORMAL LOW (ref 4.22–5.81)
RDW: 14 % (ref 11.5–15.5)
WBC: 3.3 10*3/uL — ABNORMAL LOW (ref 4.0–10.5)

## 2018-04-30 LAB — COMPREHENSIVE METABOLIC PANEL
ALBUMIN: 3.4 g/dL — AB (ref 3.5–5.0)
ALT: 17 U/L (ref 17–63)
ANION GAP: 8 (ref 5–15)
AST: 24 U/L (ref 15–41)
Alkaline Phosphatase: 31 U/L — ABNORMAL LOW (ref 38–126)
BILIRUBIN TOTAL: 0.6 mg/dL (ref 0.3–1.2)
BUN: 34 mg/dL — AB (ref 6–20)
CALCIUM: 8.9 mg/dL (ref 8.9–10.3)
CO2: 23 mmol/L (ref 22–32)
CREATININE: 1.79 mg/dL — AB (ref 0.61–1.24)
Chloride: 109 mmol/L (ref 101–111)
GFR calc Af Amer: 38 mL/min — ABNORMAL LOW (ref >60)
GFR calc non Af Amer: 33 mL/min — ABNORMAL LOW (ref >60)
GLUCOSE: 107 mg/dL — AB (ref 65–99)
Potassium: 4 mmol/L (ref 3.5–5.1)
SODIUM: 140 mmol/L (ref 135–145)
TOTAL PROTEIN: 6.1 g/dL — AB (ref 6.5–8.1)

## 2018-04-30 LAB — I-STAT TROPONIN, ED: TROPONIN I, POC: 0.02 ng/mL (ref 0.00–0.08)

## 2018-04-30 LAB — TSH: TSH: 1.458 u[IU]/mL (ref 0.350–4.500)

## 2018-04-30 LAB — ETHANOL: Alcohol, Ethyl (B): 124 mg/dL — ABNORMAL HIGH (ref <10)

## 2018-04-30 LAB — D-DIMER, QUANTITATIVE (NOT AT ARMC): D DIMER QUANT: 0.75 ug{FEU}/mL — AB (ref 0.00–0.50)

## 2018-04-30 MED ORDER — SODIUM CHLORIDE 0.9 % IV BOLUS
500.0000 mL | Freq: Once | INTRAVENOUS | Status: AC
  Administered 2018-04-30: 500 mL via INTRAVENOUS

## 2018-04-30 MED ORDER — IOPAMIDOL (ISOVUE-370) INJECTION 76%
INTRAVENOUS | Status: AC
  Filled 2018-04-30: qty 100

## 2018-04-30 MED ORDER — IOPAMIDOL (ISOVUE-370) INJECTION 76%
80.0000 mL | Freq: Once | INTRAVENOUS | Status: AC | PRN
  Administered 2018-04-30: 80 mL via INTRAVENOUS

## 2018-04-30 NOTE — ED Provider Notes (Signed)
Emergency Department Provider Note   I have reviewed the triage vital signs and the nursing notes.   HISTORY  Chief Complaint Fall   HPI Victor Clarke is a 84 y.o. male with PMH of COPD, HLD, HTN, and Parkinson's disease presents to the emergency department for evaluation after acute onset lightheadedness causing the patient to slide down to the floor.  He denies any head trauma or complete loss of consciousness.  He stayed in the floor waiting for symptoms to pass and then alerted the paramedics.  Patient lives at home alone.  He denies any fevers or chills.  No medication changes recently.  No preceding chest pain, palpitations, shortness of breath, or sudden severe headache.  He had episodes of weakness in the past but nothing that caused him to slide to the ground.  No history of seizures.  He denies any alcohol use today.   Past Medical History:  Diagnosis Date  . Allergy   . Arthritis   . Cataract    left eye cataract removed  . COPD (chronic obstructive pulmonary disease) ([place])    pt denies 02-03-17  . Detached retina    a. s/p surgical correction on the right.  . Ectopic atrial tachycardia ([place])    a. 08/2014 Echo: EF 55-60%, mildly dil LA.  Marland Kitchen Gout   . Hypercholesteremia   . Hypertension   . Left inguinal hernia    a. s/p repair ~ 10 yrs ago.  . Parkinson's disease ([place])   . Prostate cancer ([place])    a. s/p TURP.    Patient Active Problem List   Diagnosis Date Noted  . Memory loss 10/22/2016  . Malnutrition of moderate degree 11/15/2015  . Acute respiratory failure with hypoxemia ([place])   . Acute respiratory failure with hypercapnia ([place])   . Acute respiratory failure ([place])   . Alcohol intoxication ([place])   . Altered mental state   . Altered mental status 11/07/2015  . Acute encephalopathy 11/07/2015  . Fall from steps 11/07/2015  . Anemia 11/07/2015  . Alcohol abuse with intoxication ([place]) 11/07/2015  . Thrombocytopenia ([place]) 11/07/2015  . Pyuria  11/07/2015  . Alcohol withdrawal delirium ([place]) 08/08/2015  . Dehydration 08/08/2015  . UTI (urinary tract infection) 08/08/2015  . High anion gap metabolic acidosis 00/34/9179  . Dizziness 07/31/2015  . Orthostatic hypotension 07/31/2015  . Lactic acidosis 07/31/2015  . Ectopic atrial tachycardia ([place])   . Hypercholesteremia   . Dyspnea on exertion 09/16/2014  . Hypertension 09/16/2014  . New onset a-fib ([place]) 09/16/2014    Past Surgical History:  Procedure Laterality Date  . COLONOSCOPY    . COLONOSCOPY W/ POLYPECTOMY  2001, 2006  . HERNIA REPAIR    . PROSTATE SURGERY       Allergies Patient has no known allergies.  Family History  Problem Relation Age of Onset  . Stroke Father        died @ 63.  Marland Kitchen Heart attack Mother        died @ 58  . Stroke Brother        died in his 56's  . Heart attack Brother        died in his 33's  . Colon cancer Neg Hx   . Esophageal cancer Neg Hx   . Rectal cancer Neg Hx   . Stomach cancer Neg Hx   . Prostate cancer Neg Hx     Social History Social History   Tobacco Use  . Smoking  status: Former Smoker    Packs/day: 2.00    Years: 22.00    Pack years: 44.00    Types: Cigarettes    Last attempt to quit: 11/18/1977    Years since quitting: 40.4  . Smokeless tobacco: Never Used  Substance Use Topics  . Alcohol use: No    Alcohol/week: 1.2 oz    Types: 1 Shots of liquor, 1 Standard drinks or equivalent per week    Comment: None currently since previous December.   . Drug use: No    Review of Systems  Constitutional: No fever/chills. Positive lightheadedness.  Eyes: No visual changes. ENT: No sore throat. Cardiovascular: Denies chest pain. Respiratory: Denies shortness of breath. Gastrointestinal: No abdominal pain.  No nausea, no vomiting.  No diarrhea.  No constipation. Genitourinary: Negative for dysuria. Musculoskeletal: Negative for back pain. Skin: Negative for rash. Neurological: Negative for headaches, focal  weakness or numbness.  10-point ROS otherwise negative.  ____________________________________________   PHYSICAL EXAM:  VITAL SIGNS: ED Triage Vitals  Enc Vitals Group     BP 04/30/18 1940 109/65     Pulse Rate 04/30/18 1940 80     Resp 04/30/18 1940 (!) 21     Temp 04/30/18 1940 97.9 F (36.6 C)     Temp Source 04/30/18 1940 Oral     SpO2 04/30/18 1940 95 %     Pain Score 04/30/18 2016 0   Constitutional: Alert and oriented. Well appearing and in no acute distress. Eyes: Conjunctivae are normal. PERRL. EOMI. Head: Atraumatic. Nose: No congestion/rhinnorhea. Mouth/Throat: Mucous membranes are moist.  Neck: No stridor.   Cardiovascular: Normal rate, regular rhythm. Good peripheral circulation. Grossly normal heart sounds.   Respiratory: Normal respiratory effort.  No retractions. Lungs CTAB. Gastrointestinal: Soft and nontender. No distention.  Musculoskeletal: No lower extremity tenderness nor edema. No gross deformities of extremities. Neurologic:  Normal speech and language. No gross focal neurologic deficits are appreciated. Normal CN exam. No pronator drift. Normal strength and sensation in the upper and lower extremities.  Skin:  Skin is warm, dry and intact. No rash noted.  ____________________________________________   LABS (all labs ordered are listed, but only abnormal results are displayed)  Labs Reviewed  COMPREHENSIVE METABOLIC PANEL - Abnormal; Notable for the following components:      Result Value   Glucose, Bld 107 (*)    BUN 34 (*)    Creatinine, Ser 1.79 (*)    Total Protein 6.1 (*)    Albumin 3.4 (*)    Alkaline Phosphatase 31 (*)    GFR calc non Af Amer 33 (*)    GFR calc Af Amer 38 (*)    All other components within normal limits  CBC WITH DIFFERENTIAL/PLATELET - Abnormal; Notable for the following components:   WBC 3.3 (*)    RBC 3.86 (*)    Hemoglobin 12.7 (*)    HCT 37.5 (*)    All other components within normal limits  D-DIMER,  QUANTITATIVE (NOT AT [facility]) - Abnormal; Notable for the following components:   D-Dimer, Quant 0.75 (*)    All other components within normal limits  URINALYSIS, ROUTINE W REFLEX MICROSCOPIC - Abnormal; Notable for the following components:   Hgb urine dipstick SMALL (*)    Leukocytes, UA TRACE (*)    Bacteria, UA MANY (*)    All other components within normal limits  ETHANOL - Abnormal; Notable for the following components:   Alcohol, Ethyl (B) 124 (*)    All other  components within normal limits  TSH  I-STAT TROPONIN, ED   ____________________________________________  EKG    EKG Interpretation  Date/Time:  Thursday April 30 2018 19:39:37 EDT Ventricular Rate:  81 PR Interval:    QRS Duration: 106 QT Interval:  370 QTC Calculation: 430 R Axis:   -126 Text Interpretation:  Sinus or ectopic atrial rhythm Prolonged PR interval Right ventricular hypertrophy Lateral infarct, old No STEMI.  Confirmed by Nanda Quinton [phone]) on 05/01/2018 9:14:25 AM       ____________________________________________  RADIOLOGY  Dg Chest 2 View  Result Date: 04/30/2018 CLINICAL DATA:  Fall with dizziness and weakness. Shortness of breath today. EXAM: CHEST - 2 VIEW COMPARISON:  11/16/2015 FINDINGS: Lungs are adequately inflated with mild chronic interstitial changes over the lung bases. No focal lobar consolidation or effusion. Borderline cardiomegaly. Remainder of the exam is unchanged. IMPRESSION: No acute findings. Mild chronic bibasilar interstitial changes. Electronically Signed   By: Marin Olp M.D.   On: 04/30/2018 21:24   Ct Angio Chest Pe W And/or Wo Contrast  Result Date: 04/30/2018 CLINICAL DATA:  Dizziness and weakness.  Recent fall. EXAM: CT ANGIOGRAPHY CHEST WITH CONTRAST TECHNIQUE: Multidetector CT imaging of the chest was performed using the standard protocol during bolus administration of intravenous contrast. Multiplanar CT image reconstructions and MIPs were obtained to evaluate  the vascular anatomy. CONTRAST:  48mL ISOVUE-370 IOPAMIDOL (ISOVUE-370) INJECTION 76% COMPARISON:  CTA chest 09/16/2014 FINDINGS: Cardiovascular: --Pulmonary arteries: Contrast injection is sufficient to demonstrate satisfactory opacification of the pulmonary arteries to the segmental level. There is no pulmonary embolus. The main pulmonary artery is upper limits of normal for size. --Aorta: Limited opacification of the aorta due to bolus timing optimization for the pulmonary arteries. Conventional 3 vessel aortic branching pattern. The aortic course and caliber are normal. There is moderate aortic atherosclerosis. --Heart: Mild cardiomegaly. No pericardial effusion. There are coronary artery calcifications. Mediastinum/Nodes: No mediastinal, hilar or axillary lymphadenopathy. The visualized thyroid and thoracic esophageal course are unremarkable. Lungs/Pleura: Extensive interstitial lung disease changes are again noted with subpleural reticular opacities. No pleural effusion. No pneumothorax. Upper Abdomen: Contrast bolus timing is not optimized for evaluation of the abdominal organs. Within this limitation, the visualized organs of the upper abdomen are normal. Musculoskeletal: No chest wall abnormality. No acute or significant osseous findings. Review of the MIP images confirms the above findings. IMPRESSION: 1. No pulmonary embolus. 2. Chronic interstitial lung disease. 3. Cardiomegaly, coronary artery calcifications and aortic atherosclerosis (ICD10-I70.0). Electronically Signed   By: Ulyses Jarred M.D.   On: 04/30/2018 22:59    ____________________________________________   PROCEDURES  Procedure(s) performed:   Procedures  None ____________________________________________   INITIAL IMPRESSION / ASSESSMENT AND PLAN / ED COURSE  Pertinent labs & imaging results that were available during my care of the patient were reviewed by me and considered in my medical decision making (see chart for  details).  Patient presents to the emergency department for evaluation of lightheadedness which began suddenly and caused patient to slide to the floor.  No head injury.  No presyncope symptoms.  Patient's blood pressure here is slightly soft but patient is asymptomatic.  Doubt infection.  Patient's EKG shows an ectopic atrial rhythm vs 1st degree AV block which is similar to prior. No acute ischemia.   Patient with elevated EtOH and AKI. Suspect dehydration but patient d-dimer also elevated. CTA obtained with no PE. Patient feeling better after IVF. BP normalizing. Patient reports feeling well. Will discharge home with plan to  cut back on EtOH and increase non-alcoholic fluids. Patient will need urgent PCP follow up for repeat labs. No UTI symptoms.   At this time, I do not feel there is any life-threatening condition present. I have reviewed and discussed all results (EKG, imaging, lab, urine as appropriate), exam findings with patient. I have reviewed nursing notes and appropriate previous records.  I feel the patient is safe to be discharged home without further emergent workup. Discussed usual and customary return precautions. Patient and family (if present) verbalize understanding and are comfortable with this plan.  Patient will follow-up with their primary care provider. If they do not have a primary care provider, information for follow-up has been provided to them. All questions have been answered.  ____________________________________________  FINAL CLINICAL IMPRESSION(S) / ED DIAGNOSES  Final diagnoses:  Lightheadedness  Dehydration  Near syncope     MEDICATIONS GIVEN DURING THIS VISIT:  Medications  sodium chloride 0.9 % bolus 500 mL (0 mLs Intravenous Stopped 04/30/18 2234)  iopamidol (ISOVUE-370) 76 % injection 80 mL (80 mLs Intravenous Contrast Given 04/30/18 2234)  sodium chloride 0.9 % bolus 500 mL (0 mLs Intravenous Stopped 05/01/18 0003)    Note:  This document was  prepared using Dragon voice recognition software and may include unintentional dictation errors.  Nanda Quinton, MD Emergency Medicine    Fonda Rochon, [facility], MD 05/01/18 [phone]

## 2018-04-30 NOTE — ED Triage Notes (Signed)
While at home the patient had a fall in which he slid himself down to the ground. He complains of dizziness and weakness. No complaints of pain.

## 2018-04-30 NOTE — Discharge Instructions (Signed)
You have been seen today in the Emergency Department (ED)  for near-syncope (almost passing out).  Your workup including labs and EKG show reassuring results.  Your symptoms may be due to dehydration, so it is important that you drink plenty of non-alcoholic fluids. ° °Please call your regular doctor as soon as possible to schedule the next available clinic appointment to follow up with him/her regarding your visit to the ED and your symptoms.  Return to the Emergency Department (ED)  if you have any further syncopal episodes (pass out again) or develop ANY chest pain, pressure, tightness, trouble breathing, sudden sweating, or other symptoms that concern you. ° °

## 2018-05-01 LAB — URINALYSIS, ROUTINE W REFLEX MICROSCOPIC
BILIRUBIN URINE: NEGATIVE
GLUCOSE, UA: NEGATIVE mg/dL
KETONES UR: NEGATIVE mg/dL
NITRITE: NEGATIVE
PH: 6 (ref 5.0–8.0)
Protein, ur: NEGATIVE mg/dL
Specific Gravity, Urine: 1.014 (ref 1.005–1.030)

## 2018-06-18 ENCOUNTER — Inpatient Hospital Stay (HOSPITAL_COMMUNITY)
Admission: EM | Admit: 2018-06-18 | Discharge: 2018-06-20 | DRG: 683 | Disposition: A | Discharge status: Home Health | Payer: Medicare Other | Attending: Internal Medicine | Admitting: Internal Medicine

## 2018-06-18 ENCOUNTER — Emergency Department (HOSPITAL_COMMUNITY): Payer: Medicare Other

## 2018-06-18 ENCOUNTER — Observation Stay (HOSPITAL_COMMUNITY): Payer: Medicare Other

## 2018-06-18 ENCOUNTER — Other Ambulatory Visit: Payer: Self-pay

## 2018-06-18 ENCOUNTER — Encounter (HOSPITAL_COMMUNITY): Payer: Self-pay | Admitting: Emergency Medicine

## 2018-06-18 DIAGNOSIS — E7800 Pure hypercholesterolemia, unspecified: Secondary | ICD-10-CM | POA: Diagnosis present

## 2018-06-18 DIAGNOSIS — Z66 Do not resuscitate: Secondary | ICD-10-CM | POA: Diagnosis present

## 2018-06-18 DIAGNOSIS — T464X5A Adverse effect of angiotensin-converting-enzyme inhibitors, initial encounter: Secondary | ICD-10-CM | POA: Diagnosis present

## 2018-06-18 DIAGNOSIS — F10282 Alcohol dependence with alcohol-induced sleep disorder: Secondary | ICD-10-CM

## 2018-06-18 DIAGNOSIS — Z8546 Personal history of malignant neoplasm of prostate: Secondary | ICD-10-CM

## 2018-06-18 DIAGNOSIS — R55 Syncope and collapse: Secondary | ICD-10-CM

## 2018-06-18 DIAGNOSIS — I959 Hypotension, unspecified: Secondary | ICD-10-CM

## 2018-06-18 DIAGNOSIS — N390 Urinary tract infection, site not specified: Secondary | ICD-10-CM

## 2018-06-18 DIAGNOSIS — F1010 Alcohol abuse, uncomplicated: Secondary | ICD-10-CM

## 2018-06-18 DIAGNOSIS — N179 Acute kidney failure, unspecified: Secondary | ICD-10-CM | POA: Diagnosis not present

## 2018-06-18 DIAGNOSIS — T461X5A Adverse effect of calcium-channel blockers, initial encounter: Secondary | ICD-10-CM | POA: Diagnosis present

## 2018-06-18 DIAGNOSIS — N183 Chronic kidney disease, stage 3 unspecified: Secondary | ICD-10-CM | POA: Diagnosis present

## 2018-06-18 DIAGNOSIS — I440 Atrioventricular block, first degree: Secondary | ICD-10-CM

## 2018-06-18 DIAGNOSIS — I129 Hypertensive chronic kidney disease with stage 1 through stage 4 chronic kidney disease, or unspecified chronic kidney disease: Secondary | ICD-10-CM | POA: Diagnosis present

## 2018-06-18 DIAGNOSIS — E785 Hyperlipidemia, unspecified: Secondary | ICD-10-CM | POA: Diagnosis present

## 2018-06-18 DIAGNOSIS — I441 Atrioventricular block, second degree: Secondary | ICD-10-CM

## 2018-06-18 DIAGNOSIS — Z87891 Personal history of nicotine dependence: Secondary | ICD-10-CM

## 2018-06-18 DIAGNOSIS — D72819 Decreased white blood cell count, unspecified: Secondary | ICD-10-CM | POA: Diagnosis present

## 2018-06-18 DIAGNOSIS — Z9079 Acquired absence of other genital organ(s): Secondary | ICD-10-CM

## 2018-06-18 DIAGNOSIS — G20 Parkinson's disease: Secondary | ICD-10-CM | POA: Diagnosis present

## 2018-06-18 DIAGNOSIS — E861 Hypovolemia: Secondary | ICD-10-CM

## 2018-06-18 DIAGNOSIS — Z7982 Long term (current) use of aspirin: Secondary | ICD-10-CM

## 2018-06-18 DIAGNOSIS — E860 Dehydration: Secondary | ICD-10-CM | POA: Diagnosis present

## 2018-06-18 DIAGNOSIS — F1020 Alcohol dependence, uncomplicated: Secondary | ICD-10-CM | POA: Diagnosis present

## 2018-06-18 DIAGNOSIS — I10 Essential (primary) hypertension: Secondary | ICD-10-CM | POA: Diagnosis present

## 2018-06-18 DIAGNOSIS — J449 Chronic obstructive pulmonary disease, unspecified: Secondary | ICD-10-CM | POA: Diagnosis present

## 2018-06-18 DIAGNOSIS — Z79899 Other long term (current) drug therapy: Secondary | ICD-10-CM

## 2018-06-18 DIAGNOSIS — D649 Anemia, unspecified: Secondary | ICD-10-CM | POA: Diagnosis present

## 2018-06-18 DIAGNOSIS — I9589 Other hypotension: Secondary | ICD-10-CM | POA: Diagnosis present

## 2018-06-18 HISTORY — DX: Alcoholic polyneuropathy: G62.1

## 2018-06-18 LAB — CBC WITH DIFFERENTIAL/PLATELET
ABS IMMATURE GRANULOCYTES: 0.1 10*3/uL (ref 0.0–0.1)
Basophils Absolute: 0 10*3/uL (ref 0.0–0.1)
Basophils Relative: 1 %
Eosinophils Absolute: 0.1 10*3/uL (ref 0.0–0.7)
Eosinophils Relative: 2 %
HEMATOCRIT: 36.8 % — AB (ref 39.0–52.0)
HEMOGLOBIN: 12.3 g/dL — AB (ref 13.0–17.0)
IMMATURE GRANULOCYTES: 2 %
LYMPHS ABS: 0.8 10*3/uL (ref 0.7–4.0)
LYMPHS PCT: 24 %
MCH: 32.7 pg (ref 26.0–34.0)
MCHC: 33.4 g/dL (ref 30.0–36.0)
MCV: 97.9 fL (ref 78.0–100.0)
MONOS PCT: 18 %
Monocytes Absolute: 0.6 10*3/uL (ref 0.1–1.0)
NEUTROS ABS: 1.7 10*3/uL (ref 1.7–7.7)
NEUTROS PCT: 53 %
Platelets: 154 10*3/uL (ref 150–400)
RBC: 3.76 MIL/uL — ABNORMAL LOW (ref 4.22–5.81)
RDW: 12.3 % (ref 11.5–15.5)
WBC: 3.2 10*3/uL — ABNORMAL LOW (ref 4.0–10.5)

## 2018-06-18 LAB — URINALYSIS, ROUTINE W REFLEX MICROSCOPIC
BILIRUBIN URINE: NEGATIVE
Glucose, UA: 50 mg/dL — AB
Ketones, ur: NEGATIVE mg/dL
Nitrite: NEGATIVE
Protein, ur: 30 mg/dL — AB
SPECIFIC GRAVITY, URINE: 1.014 (ref 1.005–1.030)
pH: 5 (ref 5.0–8.0)

## 2018-06-18 LAB — BASIC METABOLIC PANEL
Anion gap: 16 — ABNORMAL HIGH (ref 5–15)
BUN: 59 mg/dL — ABNORMAL HIGH (ref 8–23)
CHLORIDE: 101 mmol/L (ref 98–111)
CO2: 18 mmol/L — AB (ref 22–32)
CREATININE: 3.68 mg/dL — AB (ref 0.61–1.24)
Calcium: 9.5 mg/dL (ref 8.9–10.3)
GFR calc non Af Amer: 14 mL/min — ABNORMAL LOW (ref >60)
GFR, EST AFRICAN AMERICAN: 16 mL/min — AB (ref >60)
Glucose, Bld: 113 mg/dL — ABNORMAL HIGH (ref 70–99)
Potassium: 4.6 mmol/L (ref 3.5–5.1)
Sodium: 135 mmol/L (ref 135–145)

## 2018-06-18 LAB — CBG MONITORING, ED: Glucose-Capillary: 119 mg/dL — ABNORMAL HIGH (ref 70–99)

## 2018-06-18 LAB — HEPATIC FUNCTION PANEL
ALT: 27 U/L (ref 0–44)
AST: 49 U/L — AB (ref 15–41)
Albumin: 3.4 g/dL — ABNORMAL LOW (ref 3.5–5.0)
Alkaline Phosphatase: 24 U/L — ABNORMAL LOW (ref 38–126)
BILIRUBIN INDIRECT: 1.1 mg/dL — AB (ref 0.3–0.9)
BILIRUBIN TOTAL: 1.6 mg/dL — AB (ref 0.3–1.2)
Bilirubin, Direct: 0.5 mg/dL — ABNORMAL HIGH (ref 0.0–0.2)
Total Protein: 6 g/dL — ABNORMAL LOW (ref 6.5–8.1)

## 2018-06-18 LAB — ETHANOL: ALCOHOL ETHYL (B): 63 mg/dL — AB (ref <10)

## 2018-06-18 LAB — PROTIME-INR
INR: 1.05
PROTHROMBIN TIME: 13.7 s (ref 11.4–15.2)

## 2018-06-18 LAB — RAPID URINE DRUG SCREEN, HOSP PERFORMED
AMPHETAMINES: NOT DETECTED
Barbiturates: NOT DETECTED
Benzodiazepines: NOT DETECTED
Cocaine: NOT DETECTED
OPIATES: NOT DETECTED
Tetrahydrocannabinol: NOT DETECTED

## 2018-06-18 LAB — LACTIC ACID, PLASMA
LACTIC ACID, VENOUS: 2.1 mmol/L — AB (ref 0.5–1.9)
LACTIC ACID, VENOUS: 1.2 mmol/L (ref 0.5–1.9)

## 2018-06-18 LAB — TROPONIN I: Troponin I: <0.03 ng/mL (ref <0.03)

## 2018-06-18 LAB — MRSA PCR SCREENING: MRSA BY PCR: NEGATIVE

## 2018-06-18 MED ORDER — OMEGA-3-ACID ETHYL ESTERS 1 G PO CAPS
2.0000 g | ORAL_CAPSULE | Freq: Every day | ORAL | Status: DC
Start: 2018-06-19 — End: 2018-06-20
  Administered 2018-06-19 – 2018-06-20 (×2): 2 g via ORAL
  Filled 2018-06-18 (×2): qty 2

## 2018-06-18 MED ORDER — LORAZEPAM 1 MG PO TABS: 0.0000 mg | ORAL_TABLET | Freq: Four times a day (QID) | ORAL | Status: DC

## 2018-06-18 MED ORDER — LORAZEPAM 1 MG PO TABS: 1.0000 mg | ORAL_TABLET | Freq: Four times a day (QID) | ORAL | Status: DC | PRN

## 2018-06-18 MED ORDER — ONDANSETRON HCL 4 MG/2ML IJ SOLN: 4.0000 mg | Freq: Four times a day (QID) | INTRAMUSCULAR | Status: DC | PRN

## 2018-06-18 MED ORDER — THIAMINE HCL 100 MG/ML IJ SOLN
Freq: Once | INTRAVENOUS | Status: AC
  Administered 2018-06-18: 23:00:00 via INTRAVENOUS
  Filled 2018-06-18: qty 1000

## 2018-06-18 MED ORDER — OXYCODONE HCL 5 MG PO TABS: 5.0000 mg | ORAL_TABLET | Freq: Four times a day (QID) | ORAL | Status: DC | PRN

## 2018-06-18 MED ORDER — THIAMINE HCL 100 MG/ML IJ SOLN: 100.0000 mg | Freq: Every day | INTRAMUSCULAR | Status: DC

## 2018-06-18 MED ORDER — BISACODYL 5 MG PO TBEC: 5.0000 mg | DELAYED_RELEASE_TABLET | Freq: Every day | ORAL | Status: DC | PRN

## 2018-06-18 MED ORDER — ACETAMINOPHEN 650 MG RE SUPP: 650.0000 mg | Freq: Four times a day (QID) | RECTAL | Status: DC | PRN

## 2018-06-18 MED ORDER — ADULT MULTIVITAMIN W/MINERALS CH
1.0000 | ORAL_TABLET | Freq: Every day | ORAL | Status: DC
  Administered 2018-06-19 – 2018-06-20 (×2): 1 via ORAL
  Filled 2018-06-18 (×2): qty 1

## 2018-06-18 MED ORDER — VITAMIN B-1 100 MG PO TABS
100.0000 mg | ORAL_TABLET | Freq: Every day | ORAL | Status: DC
  Administered 2018-06-19 – 2018-06-20 (×2): 100 mg via ORAL
  Filled 2018-06-18 (×2): qty 1

## 2018-06-18 MED ORDER — ROSUVASTATIN CALCIUM 20 MG PO TABS
20.0000 mg | ORAL_TABLET | Freq: Every day | ORAL | Status: DC
  Administered 2018-06-19: 20 mg via ORAL
  Filled 2018-06-18: qty 1

## 2018-06-18 MED ORDER — ACETAMINOPHEN 325 MG PO TABS: 650.0000 mg | ORAL_TABLET | Freq: Four times a day (QID) | ORAL | Status: DC | PRN

## 2018-06-18 MED ORDER — SODIUM CHLORIDE 0.9 % IV BOLUS
1000.0000 mL | Freq: Once | INTRAVENOUS | Status: AC
  Administered 2018-06-18: 1000 mL via INTRAVENOUS

## 2018-06-18 MED ORDER — SODIUM CHLORIDE 0.9 % IV SOLN: INTRAVENOUS | Status: DC

## 2018-06-18 MED ORDER — HEPARIN SODIUM (PORCINE) 5000 UNIT/ML IJ SOLN
5000.0000 [IU] | Freq: Three times a day (TID) | INTRAMUSCULAR | Status: DC
  Administered 2018-06-19 – 2018-06-20 (×3): 5000 [IU] via SUBCUTANEOUS
  Filled 2018-06-18 (×4): qty 1

## 2018-06-18 MED ORDER — SODIUM CHLORIDE 0.9% FLUSH
3.0000 mL | Freq: Two times a day (BID) | INTRAVENOUS | Status: DC
  Administered 2018-06-18 – 2018-06-19 (×2): 3 mL via INTRAVENOUS

## 2018-06-18 MED ORDER — FOLIC ACID 1 MG PO TABS
1.0000 mg | ORAL_TABLET | Freq: Every day | ORAL | Status: DC
  Administered 2018-06-19 – 2018-06-20 (×2): 1 mg via ORAL
  Filled 2018-06-18 (×2): qty 1

## 2018-06-18 MED ORDER — SODIUM CHLORIDE 0.9 % IV SOLN
1.0000 g | Freq: Once | INTRAVENOUS | Status: AC
  Administered 2018-06-18: 1 g via INTRAVENOUS
  Filled 2018-06-18: qty 10

## 2018-06-18 MED ORDER — LORAZEPAM 2 MG/ML IJ SOLN: 1.0000 mg | Freq: Four times a day (QID) | INTRAMUSCULAR | Status: DC | PRN

## 2018-06-18 MED ORDER — ASPIRIN EC 81 MG PO TBEC
81.0000 mg | DELAYED_RELEASE_TABLET | Freq: Every day | ORAL | Status: DC
  Administered 2018-06-19 – 2018-06-20 (×2): 81 mg via ORAL
  Filled 2018-06-18 (×2): qty 1

## 2018-06-18 MED ORDER — ENSURE ENLIVE PO LIQD
237.0000 mL | Freq: Three times a day (TID) | ORAL | Status: DC
  Administered 2018-06-18 – 2018-06-19 (×2): 237 mL via ORAL

## 2018-06-18 MED ORDER — SENNOSIDES-DOCUSATE SODIUM 8.6-50 MG PO TABS: 1.0000 | ORAL_TABLET | Freq: Every evening | ORAL | Status: DC | PRN

## 2018-06-18 MED ORDER — ONDANSETRON HCL 4 MG PO TABS: 4.0000 mg | ORAL_TABLET | Freq: Four times a day (QID) | ORAL | Status: DC | PRN

## 2018-06-18 MED ORDER — LORAZEPAM 1 MG PO TABS: 0.0000 mg | ORAL_TABLET | Freq: Two times a day (BID) | ORAL | Status: DC

## 2018-06-18 NOTE — ED Notes (Signed)
MD Thurnell Garbe notified about systolics in the 09'K and 80's.  Per MD, give NS bolus.  Will continue to monitor.  Pt mentating well.

## 2018-06-18 NOTE — ED Notes (Signed)
Patient transported to CT 

## 2018-06-18 NOTE — Consult Note (Signed)
Cardiology Consultation:   Patient ID: Victor Clarke; [id number]; Feb 16, 1929   Admit date: 06/18/2018 Date of Consult: 06/18/2018  Primary Care Provider: Golden Circle, FNP Primary Cardiologist: None  Patient Profile:   Victor Clarke is a 84 y.o. male with a hx of prostate cancer s/p TURP, COPD, gout, HTN, HLD, alcohol use who is being seen today for the evaluation of syncope at the request of Dr. Thurnell Garbe.  History of Present Illness:   Victor Clarke is alone on interview today and gives his own history. He reports that he was standing at the stove trying to cook and passed out. Denies any presyncopal symptoms. No chest pain or palpitations. States this happened before several months ago, doing a similar activity. He endorses loss of appetite and weight loss, states he hasn't really eaten in 3 weeks. He was brought in by EMS with a presenting blood pressure of 86/42, which has improved significantly with fluids.  ROS notable for no fevers/chills, positive for blood in stools chronically which he has treated with suppositories. No chest pain, SOB, PND, orthopnea, or palpitations.  Past Medical History:  Diagnosis Date  . Alcoholic peripheral neuropathy ([place])   . Allergy   . Arthritis   . Cataract    left eye cataract removed  . COPD (chronic obstructive pulmonary disease) ([place])    pt denies 02-03-17  . Detached retina    a. s/p surgical correction on the right.  . Ectopic atrial tachycardia ([place])    a. 08/2014 Echo: EF 55-60%, mildly dil LA.  Marland Kitchen Gout   . Hypercholesteremia   . Hypertension   . Left inguinal hernia    a. s/p repair ~ 10 yrs ago.  . Parkinson's disease ([place])   . Prostate cancer ([place])    a. s/p TURP.    Past Surgical History:  Procedure Laterality Date  . COLONOSCOPY    . COLONOSCOPY W/ POLYPECTOMY  2001, 2006  . HERNIA REPAIR    . PROSTATE SURGERY       Home Medications:  Prior to Admission medications   Medication Sig Start Date End Date Taking?  Authorizing Provider  amLODipine-benazepril (LOTREL) 5-20 MG capsule TK 1 C PO D 04/21/18   [provider]  aspirin EC 81 MG tablet Take 81 mg by mouth daily.    [provider]  Desoximetasone 0.25 % LIQD Apply 0.25 sprays topically 2 (two) times daily. For 2 Weeks 04/24/18   [provider]  diltiazem (CARDIZEM CD) 120 MG 24 hr capsule Take 1 capsule (120 mg total) by mouth daily. 09/29/14   Theora Gianotti, NP  feeding supplement, ENSURE ENLIVE, (ENSURE ENLIVE) LIQD Take 237 mLs by mouth 3 (three) times daily between meals. 08/08/15   Kelvin Cellar, MD  fenofibrate (TRICOR) 145 MG tablet TK 1 T PO QD 03/13/18   [provider]  folic acid (FOLVITE) 1 MG tablet Take 1 tablet (1 mg total) by mouth daily. 11/20/15   Thurnell Lose, MD  Multiple Vitamin (MULTIVITAMIN) tablet Take 1 tablet by mouth daily.    [provider]  Omega-3 Fatty Acids (FISH OIL) 1200 MG CAPS Take 2,400 mg by mouth daily.    [provider]  rosuvastatin (CRESTOR) 20 MG tablet TK 1 T PO QD 03/17/18   [provider]  triamcinolone cream (KENALOG) 0.1 % APPLY AA BID 04/23/18   [provider]   Allergies:   No Known Allergies  Social History:   Social History  Socioeconomic History  . Marital status: Widowed    Spouse name: Not on file  . Number of children: 0  . Years of education: College  . Highest education level: Not on file  Occupational History  . Occupation: Retired    Fish farm manager: OTHER  Social Needs  . Financial resource strain: Not on file  . Food insecurity:    Worry: Not on file    Inability: Not on file  . Transportation needs:    Medical: Not on file    Non-medical: Not on file  Tobacco Use  . Smoking status: Former Smoker    Packs/day: 2.00    Years: 22.00    Pack years: 44.00    Types: Cigarettes    Last attempt to quit: 11/18/1977    Years since quitting: 40.6  . Smokeless tobacco: Never Used  Substance and  Sexual Activity  . Alcohol use: No    Alcohol/week: 1.2 oz    Types: 1 Shots of liquor, 1 Standard drinks or equivalent per week    Comment: None currently since previous December.   . Drug use: No  . Sexual activity: Never  Lifestyle  . Physical activity:    Days per week: Not on file    Minutes per session: Not on file  . Stress: Not on file  Relationships  . Social connections:    Talks on phone: Not on file    Gets together: Not on file    Attends religious service: Not on file    Active member of club or organization: Not on file    Attends meetings of clubs or organizations: Not on file    Relationship status: Not on file  . Intimate partner violence:    Fear of current or ex partner: Not on file    Emotionally abused: Not on file    Physically abused: Not on file    Forced sexual activity: Not on file  Other Topics Concern  . Not on file  Social History Narrative   Patient lives at an assisted living facility.  Has no children.     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     Education: college degree.   Caffeine Use: 1 cup daily    Family History:    Family History  Problem Relation Age of Onset  . Stroke Father        died @ 63.  Marland Kitchen Heart attack Mother        died @ 38  . Stroke Brother        died in his 58's  . Heart attack Brother        died in his 48's  . Colon cancer Neg Hx   . Esophageal cancer Neg Hx   . Rectal cancer Neg Hx   . Stomach cancer Neg Hx   . Prostate cancer Neg Hx      ROS:  Please see the history of present illness.  Review of Systems  Constitution: Positive for decreased appetite, malaise/fatigue and weight loss. Negative for chills, diaphoresis, fever and night sweats.  HENT: Positive for hearing loss. Negative for nosebleeds and sore throat.   Eyes: Negative for blurred vision, double vision and photophobia.  Cardiovascular: Positive for syncope. Negative for chest pain, claudication, cyanosis, dyspnea on  exertion, irregular heartbeat, leg swelling, orthopnea, palpitations and paroxysmal nocturnal dyspnea.  Respiratory: Negative for cough, shortness of breath and sputum production.   Endocrine: Negative for polydipsia and polyphagia.  Hematologic/Lymphatic: Negative for bleeding problem. Does not bruise/bleed easily.  Skin: Negative for rash and suspicious lesions.  Musculoskeletal: Negative for joint swelling and muscle weakness.  Gastrointestinal: Positive for anorexia, hematochezia and hemorrhoids. Negative for abdominal pain.  Genitourinary: Negative for frequency and hematuria.  Neurological: Negative for focal weakness and vertigo.   All other ROS reviewed and negative.     Physical Exam/Data:   Vitals:   06/18/18 1700 06/18/18 1715 06/18/18 1800 06/18/18 1815  BP: (!) 82/65 (!) 89/65 98/82 116/61  Pulse: 73 76 82 66  Resp: 16 (!) 21 16 (!) 21  Temp:      TempSrc:      SpO2: 99% 94% 100% 99%  Weight:      Height:       No intake or output data in the 24 hours ending 06/18/18 1825 Filed Weights   06/18/18 1651  Weight: 160 lb (72.6 kg)   Body mass index is 23.63 kg/m.  General:  Frail appearing, in NAD HEENT: normal Lymph: no adenopathy Neck: no JVD Endocrine:  No thryomegaly Vascular: No carotid bruits; FA pulses 2+ bilaterally without bruits  Cardiac:  normal S1, S2 with occasional missed beat; RRR; no murmur Lungs:  clear to auscultation bilaterally, no wheezing, rhonchi or rales  Abd: soft, nontender, no hepatomegaly  Ext: no edema Musculoskeletal:  No deformities, BUE and BLE strength normal and equal Skin: warm and dry  Neuro:  CNs 2-12 intact, no focal abnormalities noted Psych:  Normal affect   EKG:  The EKG was personally reviewed and demonstrates:  Sinus rhythm with Mobitz II vs. Blocked PAC Telemetry:  Telemetry was personally reviewed and demonstrates:  More consistent with Mobitz II  Relevant CV Studies: none  Laboratory Data: All  pending  ChemistryNo results for input(s): NA, K, CL, CO2, GLUCOSE, BUN, CREATININE, CALCIUM, GFRNONAA, GFRAA, ANIONGAP in the last 168 hours.  No results for input(s): PROT, ALBUMIN, AST, ALT, ALKPHOS, BILITOT in the last 168 hours. HematologyNo results for input(s): WBC, RBC, HGB, HCT, MCV, MCH, MCHC, RDW, PLT in the last 168 hours. Cardiac EnzymesNo results for input(s): TROPONINI in the last 168 hours. No results for input(s): TROPIPOC in the last 168 hours.  BNPNo results for input(s): BNP, PROBNP in the last 168 hours.  DDimer No results for input(s): DDIMER in the last 168 hours.  Radiology/Studies:  Dg Chest 2 View  Result Date: 06/18/2018 CLINICAL DATA:  Syncopal episode in the kitchen. EXAM: CHEST - 2 VIEW COMPARISON:  04/30/2018 CXR and chest CT FINDINGS: Stable borderline cardiomegaly and aortic atherosclerosis. No acute pulmonary consolidation, effusion or edema. No acute osseous abnormality. Stable bilateral fine interstitial lung markings that may reflect chronic interstitial lung changes are redemonstrated. IMPRESSION: 1. Stable borderline cardiomegaly with aortic atherosclerosis. 2. Fine interstitial prominence of the lungs likely reflecting chronic interstitial change/fibrosis. 3. No active pulmonary disease. Electronically Signed   By: Ashley Royalty M.D.   On: 06/18/2018 18:12   Ct Head Wo Contrast  Result Date: 06/18/2018 CLINICAL DATA:  Syncopal episode today with hypotension. EXAM: CT HEAD WITHOUT CONTRAST TECHNIQUE: Contiguous axial images were obtained from the base of the skull through the vertex without intravenous contrast. COMPARISON:  11/10/2015 FINDINGS: Brain: Moderate-to-marked sulcal and ventricular prominence consistent with cerebral atrophy is redemonstrated with moderate small vessel ischemic disease. No acute intracranial hemorrhage, mass, midline shift or edema. Midline fourth ventricle and basal cisterns without effacement. No extra-axial fluid collections. No acute  intracranial hemorrhage. Vascular: Moderate atherosclerosis of the  carotid siphons. Skull: Negative for acute fracture or suspicious osseous lesions. Sinuses/Orbits: No acute finding. Cataract surgical change on the left with glaucoma reservoir or scleral band on the right as before. Other: None IMPRESSION: Chronic stable cerebral atrophy and moderate small vessel ischemia. No acute intracranial abnormality. Electronically Signed   By: Ashley Royalty M.D.   On: 06/18/2018 18:01    Assessment and Plan:   1. Syncope: also in the setting of hypotension, anorexia/poor PO intake, alcohol use -would ask medicine for admission -monitor on telemetry -get echo -will follow labs -if 2nd degree type II, may need to determine if he is a candidate for a pacemaker. Lives alone, significant alcohol use. Will monitor for complete heart block as well -check orthostatics  We will follow with you.   For questions or updates, please contact [facility] Please consult [email or web address] for contact info under Cardiology/STEMI.   Signed, Buford Dresser, MD  06/18/2018 6:25 PM

## 2018-06-18 NOTE — ED Provider Notes (Signed)
[facility] Provider Note   CSN: [id number] Arrival date & time: 06/18/18  1639     History   Chief Complaint No chief complaint on file.   HPI Victor Clarke is a 84 y.o. male.  HPI  Pt was seen at 1710. Per EMS and pt report, c/o sudden onset and resolution of one episode of syncope that occurred today PTA. Pt states he was standing his in kitchen when he felt lightheaded and "passed out." Pt states he woke up on the floor, then was able to call EMS. EMS states pt's BP was "86/42" and "96/56." Pt states he has been drinking etoh and not taking PO food/fluids for the past several days. Denies CP/palpitations, no SOB/cough, no abd pain, no N/V/D, no neck or back pain, no focal motor weakness, no tingling/numbness in extremities.   Past Medical History:  Diagnosis Date  . Alcoholic peripheral neuropathy ([place])   . Allergy   . Arthritis   . Cataract    left eye cataract removed  . COPD (chronic obstructive pulmonary disease) ([place])    pt denies 02-03-17  . Detached retina    a. s/p surgical correction on the right.  . Ectopic atrial tachycardia ([place])    a. 08/2014 Echo: EF 55-60%, mildly dil LA.  Marland Kitchen Gout   . Hypercholesteremia   . Hypertension   . Left inguinal hernia    a. s/p repair ~ 10 yrs ago.  . Parkinson's disease ([place])   . Prostate cancer ([place])    a. s/p TURP.    Patient Active Problem List   Diagnosis Date Noted  . Memory loss 10/22/2016  . Malnutrition of moderate degree 11/15/2015  . Acute respiratory failure with hypoxemia ([place])   . Acute respiratory failure with hypercapnia ([place])   . Acute respiratory failure ([place])   . Alcohol intoxication ([place])   . Altered mental state   . Altered mental status 11/07/2015  . Acute encephalopathy 11/07/2015  . Fall from steps 11/07/2015  . Anemia 11/07/2015  . Alcohol abuse with intoxication ([place]) 11/07/2015  . Thrombocytopenia ([place]) 11/07/2015  . Pyuria 11/07/2015  . Alcohol  withdrawal delirium ([place]) 08/08/2015  . Dehydration 08/08/2015  . UTI (urinary tract infection) 08/08/2015  . High anion gap metabolic acidosis 35/36/1443  . Dizziness 07/31/2015  . Orthostatic hypotension 07/31/2015  . Lactic acidosis 07/31/2015  . Ectopic atrial tachycardia ([place])   . Hypercholesteremia   . Dyspnea on exertion 09/16/2014  . Hypertension 09/16/2014  . New onset a-fib ([place]) 09/16/2014    Past Surgical History:  Procedure Laterality Date  . COLONOSCOPY    . COLONOSCOPY W/ POLYPECTOMY  2001, 2006  . HERNIA REPAIR    . PROSTATE SURGERY          Home Medications    Prior to Admission medications   Medication Sig Start Date End Date Taking? Authorizing Provider  amLODipine-benazepril (LOTREL) 5-20 MG capsule TK 1 C PO D 04/21/18   [provider]  aspirin EC 81 MG tablet Take 81 mg by mouth daily.    [provider]  Desoximetasone 0.25 % LIQD Apply 0.25 sprays topically 2 (two) times daily. For 2 Weeks 04/24/18   [provider]  diltiazem (CARDIZEM CD) 120 MG 24 hr capsule Take 1 capsule (120 mg total) by mouth daily. 09/29/14   Theora Gianotti, NP  feeding supplement, ENSURE ENLIVE, (ENSURE ENLIVE) LIQD Take 237 mLs by mouth 3 (three) times daily between meals.  08/08/15   Kelvin Cellar, MD  fenofibrate (TRICOR) 145 MG tablet TK 1 T PO QD 03/13/18   [provider]  folic acid (FOLVITE) 1 MG tablet Take 1 tablet (1 mg total) by mouth daily. 11/20/15   Thurnell Lose, MD  Multiple Vitamin (MULTIVITAMIN) tablet Take 1 tablet by mouth daily.    [provider]  Omega-3 Fatty Acids (FISH OIL) 1200 MG CAPS Take 2,400 mg by mouth daily.    [provider]  rosuvastatin (CRESTOR) 20 MG tablet TK 1 T PO QD 03/17/18   [provider]  triamcinolone cream (KENALOG) 0.1 % APPLY AA BID 04/23/18   [provider]    Family History Family History  Problem Relation Age of Onset  . Stroke Father         died @ 61.  Marland Kitchen Heart attack Mother        died @ 77  . Stroke Brother        died in his 76's  . Heart attack Brother        died in his 20's  . Colon cancer Neg Hx   . Esophageal cancer Neg Hx   . Rectal cancer Neg Hx   . Stomach cancer Neg Hx   . Prostate cancer Neg Hx     Social History Social History   Tobacco Use  . Smoking status: Former Smoker    Packs/day: 2.00    Years: 22.00    Pack years: 44.00    Types: Cigarettes    Last attempt to quit: 11/18/1977    Years since quitting: 40.6  . Smokeless tobacco: Never Used  Substance Use Topics  . Alcohol use: No    Alcohol/week: 1.2 oz    Types: 1 Shots of liquor, 1 Standard drinks or equivalent per week    Comment: None currently since previous December.   . Drug use: No     Allergies   Patient has no known allergies.   Review of Systems Review of Systems ROS: Statement: All systems negative except as marked or noted in the HPI; Constitutional: Negative for fever and chills. ; ; Eyes: Negative for eye pain, redness and discharge. ; ; ENMT: Negative for ear pain, hoarseness, nasal congestion, sinus pressure and sore throat. ; ; Cardiovascular: Negative for chest pain, palpitations, diaphoresis, dyspnea and peripheral edema. ; ; Respiratory: Negative for cough, wheezing and stridor. ; ; Gastrointestinal: Negative for nausea, vomiting, diarrhea, abdominal pain, blood in stool, hematemesis, jaundice and rectal bleeding. . ; ; Genitourinary: Negative for dysuria, flank pain and hematuria. ; ; Musculoskeletal: Negative for back pain and neck pain. Negative for swelling and trauma.; ; Skin: Negative for pruritus, rash, abrasions, blisters, bruising and skin lesion.; ; Neuro: Negative for headache and neck stiffness. Negative for extremity weakness, paresthesias, involuntary movement, seizure and +lightheadedness, syncope.       Physical Exam Updated Vital Signs BP 116/61   Pulse 66   Temp 97.6 F (36.4 C) (Oral)   Resp  (!) 21   Ht 5\' 9"  (1.753 m)   Wt 72.6 kg (160 lb)   SpO2 99%   BMI 23.63 kg/m    Patient Vitals for the past 24 hrs:  BP Temp Temp src Pulse Resp SpO2 Height Weight  06/18/18 1815 116/61 - - 66 (!) 21 99 % - -  06/18/18 1800 98/82 - - 82 16 100 % - -  06/18/18 1715 (!) 89/65 - - 76 (!) 21 94 % - -  06/18/18 1700 (!) 82/65 - - 73 16 99 % - -  06/18/18 1656 (!) 75/58 97.6 F (36.4 C) Oral 72 17 100 % - -  06/18/18 1651 (!) 75/58 - - - 13 - 5\' 9"  (1.753 m) 72.6 kg (160 lb)  06/18/18 1642 - - - - - 100 % - -     Physical Exam 1715: Physical examination:  Nursing notes reviewed; Vital signs and O2 SAT reviewed;  Constitutional: Well developed, Well nourished, In no acute distress; Head:  Normocephalic, atraumatic; Eyes: EOMI, PERRL, No scleral icterus; ENMT: Mouth and pharynx normal, Mucous membranes dry; Neck: Supple, Full range of motion, No lymphadenopathy; Cardiovascular: Regular rate and rhythm, No gallop; Respiratory: Breath sounds clear & equal bilaterally, No wheezes.  Speaking full sentences with ease, Normal respiratory effort/excursion; Chest: Nontender, Movement normal; Abdomen: Soft, Nontender, Nondistended, Normal bowel sounds; Genitourinary: No CVA tenderness; Extremities: Peripheral pulses normal, No tenderness, No edema, No calf edema or asymmetry.; Neuro: AA&Ox3, Major CN grossly intact. No facial droop. Speech clear. Grips equal. Strength 5/5 equal bilat UE's and LE's. Moves all extremities spontaneously and to command without apparent gross focal motor deficits.; Skin: Color normal, Warm, Dry.   ED Treatments / Results  Labs (all labs ordered are listed, but only abnormal results are displayed)   EKG EKG Interpretation  Date/Time:  Thursday June 18 2018 16:52:22 EDT Ventricular Rate:  74 PR Interval:    QRS Duration: 111 QT Interval:  376 QTC Calculation: 418 R Axis:   -71 Text Interpretation:  Second degree AV block, Mobitz II Ventricular premature complex  Left anterior fascicular block Abnormal R-wave progression, late transition When compared with ECG of 04/30/2018 Mobitz II 2-degree AV block is now Present Confirmed by Francine Graven [phone]) on 06/18/2018 5:23:53 PM   Radiology   Procedures Procedures (including critical care time)  Medications Ordered in ED Medications  0.9 %  sodium chloride infusion (has no administration in time range)  sodium chloride 0.9 % bolus 1,000 mL (0 mLs Intravenous Stopped 06/18/18 1830)     Initial Impression / Assessment and Plan / ED Course  I have reviewed the triage vital signs and the nursing notes.  Pertinent labs & imaging results that were available during my care of the patient were reviewed by me and considered in my medical decision making (see chart for details).  MDM Reviewed: previous chart, nursing note and vitals Reviewed previous: labs and ECG Interpretation: labs, ECG, x-ray and CT scan Total time providing critical care: 30-74 minutes. This excludes time spent performing separately reportable procedures and services. Consults: cardiology and admitting MD    CRITICAL CARE Performed by: Francine Graven Total critical care time: 35 minutes Critical care time was exclusive of separately billable procedures and treating other patients. Critical care was necessary to treat or prevent imminent or life-threatening deterioration. Critical care was time spent personally by me on the following activities: development of treatment plan with patient and/or surrogate as well as nursing, discussions with consultants, evaluation of patient's response to treatment, examination of patient, obtaining history from patient or surrogate, ordering and performing treatments and interventions, ordering and review of laboratory studies, ordering and review of radiographic studies, pulse oximetry and re-evaluation of patient's condition.   Results for orders placed or performed during the hospital  encounter of 90/24/09  Basic metabolic panel  Result Value Ref Range   Sodium 135 135 - 145 mmol/L   Potassium 4.6 3.5 - 5.1 mmol/L   Chloride 101 98 -  111 mmol/L   CO2 18 (L) 22 - 32 mmol/L   Glucose, Bld 113 (H) 70 - 99 mg/dL   BUN 59 (H) 8 - 23 mg/dL   Creatinine, Ser 3.68 (H) 0.61 - 1.24 mg/dL   Calcium 9.5 8.9 - 10.3 mg/dL   GFR calc non Af Amer 14 (L) >60 mL/min   GFR calc Af Amer 16 (L) >60 mL/min   Anion gap 16 (H) 5 - 15  CBC WITH DIFFERENTIAL  Result Value Ref Range   WBC 3.2 (L) 4.0 - 10.5 K/uL   RBC 3.76 (L) 4.22 - 5.81 MIL/uL   Hemoglobin 12.3 (L) 13.0 - 17.0 g/dL   HCT 36.8 (L) 39.0 - 52.0 %   MCV 97.9 78.0 - 100.0 fL   MCH 32.7 26.0 - 34.0 pg   MCHC 33.4 30.0 - 36.0 g/dL   RDW 12.3 11.5 - 15.5 %   Platelets 154 150 - 400 K/uL   Neutrophils Relative % 53 %   Neutro Abs 1.7 1.7 - 7.7 K/uL   Lymphocytes Relative 24 %   Lymphs Abs 0.8 0.7 - 4.0 K/uL   Monocytes Relative 18 %   Monocytes Absolute 0.6 0.1 - 1.0 K/uL   Eosinophils Relative 2 %   Eosinophils Absolute 0.1 0.0 - 0.7 K/uL   Basophils Relative 1 %   Basophils Absolute 0.0 0.0 - 0.1 K/uL   Immature Granulocytes 2 %   Abs Immature Granulocytes 0.1 0.0 - 0.1 K/uL  Lactic acid, plasma  Result Value Ref Range   Lactic Acid, Venous 2.1 (HH) 0.5 - 1.9 mmol/L  Urinalysis, Routine w reflex microscopic  Result Value Ref Range   Color, Urine YELLOW YELLOW   APPearance HAZY (A) CLEAR   Specific Gravity, Urine 1.014 1.005 - 1.030   pH 5.0 5.0 - 8.0   Glucose, UA 50 (A) NEGATIVE mg/dL   Hgb urine dipstick MODERATE (A) NEGATIVE   Bilirubin Urine NEGATIVE NEGATIVE   Ketones, ur NEGATIVE NEGATIVE mg/dL   Protein, ur 30 (A) NEGATIVE mg/dL   Nitrite NEGATIVE NEGATIVE   Leukocytes, UA TRACE (A) NEGATIVE   RBC / HPF 0-5 0 - 5 RBC/hpf   WBC, UA 21-50 0 - 5 WBC/hpf   Bacteria, UA MANY (A) NONE SEEN   Squamous Epithelial / LPF 0-5 0 - 5   Mucus PRESENT    Hyaline Casts, UA PRESENT   Ethanol  Result Value Ref  Range   Alcohol, Ethyl (B) 63 (H) <10 mg/dL  Urine rapid drug screen (hosp performed)  Result Value Ref Range   Opiates NONE DETECTED NONE DETECTED   Cocaine NONE DETECTED NONE DETECTED   Benzodiazepines NONE DETECTED NONE DETECTED   Amphetamines NONE DETECTED NONE DETECTED   Tetrahydrocannabinol NONE DETECTED NONE DETECTED   Barbiturates NONE DETECTED NONE DETECTED  Troponin I  Result Value Ref Range   Troponin I <0.03 <0.03 ng/mL  Protime-INR  Result Value Ref Range   Prothrombin Time 13.7 11.4 - 15.2 seconds   INR 1.05   Hepatic function panel  Result Value Ref Range   Total Protein 6.0 (L) 6.5 - 8.1 g/dL   Albumin 3.4 (L) 3.5 - 5.0 g/dL   AST 49 (H) 15 - 41 U/L   ALT 27 0 - 44 U/L   Alkaline Phosphatase 24 (L) 38 - 126 U/L   Total Bilirubin 1.6 (H) 0.3 - 1.2 mg/dL   Bilirubin, Direct 0.5 (H) 0.0 - 0.2 mg/dL   Indirect Bilirubin 1.1 (H)  0.3 - 0.9 mg/dL  CBG monitoring, ED  Result Value Ref Range   Glucose-Capillary 119 (H) 70 - 99 mg/dL   Dg Chest 2 View Result Date: 06/18/2018 CLINICAL DATA:  Syncopal episode in the kitchen. EXAM: CHEST - 2 VIEW COMPARISON:  04/30/2018 CXR and chest CT FINDINGS: Stable borderline cardiomegaly and aortic atherosclerosis. No acute pulmonary consolidation, effusion or edema. No acute osseous abnormality. Stable bilateral fine interstitial lung markings that may reflect chronic interstitial lung changes are redemonstrated. IMPRESSION: 1. Stable borderline cardiomegaly with aortic atherosclerosis. 2. Fine interstitial prominence of the lungs likely reflecting chronic interstitial change/fibrosis. 3. No active pulmonary disease. Electronically Signed   By: Ashley Royalty M.D.   On: 06/18/2018 18:12   Ct Head Wo Contrast Result Date: 06/18/2018 CLINICAL DATA:  Syncopal episode today with hypotension. EXAM: CT HEAD WITHOUT CONTRAST TECHNIQUE: Contiguous axial images were obtained from the base of the skull through the vertex without intravenous contrast.  COMPARISON:  11/10/2015 FINDINGS: Brain: Moderate-to-marked sulcal and ventricular prominence consistent with cerebral atrophy is redemonstrated with moderate small vessel ischemic disease. No acute intracranial hemorrhage, mass, midline shift or edema. Midline fourth ventricle and basal cisterns without effacement. No extra-axial fluid collections. No acute intracranial hemorrhage. Vascular: Moderate atherosclerosis of the carotid siphons. Skull: Negative for acute fracture or suspicious osseous lesions. Sinuses/Orbits: No acute finding. Cataract surgical change on the left with glaucoma reservoir or scleral band on the right as before. Other: None IMPRESSION: Chronic stable cerebral atrophy and moderate small vessel ischemia. No acute intracranial abnormality. Electronically Signed   By: Ashley Royalty M.D.   On: 06/18/2018 18:01   Results for SHAD, LEDVINA (MRN [id number]) as of 06/18/2018 19:29  Ref. Range 10/22/2016 10:05 04/30/2018 21:24 06/18/2018 18:09  BUN Latest Ref Range: 8 - 23 mg/dL 21 34 (H) 59 (H)  Creatinine Latest Ref Range: 0.61 - 1.24 mg/dL 1.32 1.79 (H) 3.68 (H)     1745: EKG with new Mobitz II. HR remains 70's.  T/C returned from [facility] Dr. Oval Linsey, case discussed, including:  HPI, pertinent PM/SHx, VS/PE, dx testing, ED course and treatment:  Agreeable to consult, requests to admit to [facility].  1830:  Cards Dr. Harrell Gave has consulted in the ED: no acute cardiac intervention at this time, as this rhythm appears stable.   1940:  SBP low 80's on arrival to ED. IVF bolus given with improvement. Pt remains A&O. BUN/Cr elevated from baseline, will continue IVF. +UTI, UC pending; will dose IV rocephin. Dx and testing d/w pt.  Questions answered.  Verb understanding, agreeable to admit.  T/C returned from [facility] Dr. Myna Hidalgo, case discussed, including:  HPI, pertinent PM/SHx, VS/PE, dx testing, ED course and treatment:  Agreeable to admit.     Final Clinical Impressions(s) / ED Diagnoses    Final diagnoses:  None    ED Discharge Orders    None       Francine Graven, DO 06/21/18 1410

## 2018-06-18 NOTE — ED Triage Notes (Signed)
Pt here via [facility] c/o syncopal episode in kitchen.  When fire arrived bp was 86/42 and when ems arrived 96/56, SPO2 100% RA, P 81, RR 16, CBG 119. Pt A&O x4. Pt states he has been anxious all week and hasn't been sleeping well.

## 2018-06-18 NOTE — H&P (Signed)
History and Physical    Victor Clarke XFG:[id number] DOB: [birth year] DOA: 06/18/2018  PCP: [facility], FNP   Patient coming from: Home   Chief Complaint: Syncope   HPI: Victor Clarke is a 84 y.o. male with medical history significant for alcohol dependence, hypertension, prostate cancer status post TURP, and chronic renal insufficiency, now presenting to the emergency department after syncopal episode at home.  The patient reports that he has had a poor appetite and has not been eating or drinking much of anything in the last 2 to 3 weeks.  He reports lightheadedness upon standing over the past week, and then suffered a syncopal episode just prior to arrival while standing in his kitchen.  He denies any significant pain or injury following this.  He denies any preceding chest pain or palpitations, but reports acute lightheadedness just prior to the event.  He denies headache, change in vision or hearing, or focal numbness or weakness.  He denies swelling or tenderness in the lower extremities.  He describes a history of presyncopal events.  ED Course: Upon arrival to the ED, patient is found to be afebrile, saturating well on room air, and with blood pressure 75/58.  EKG features a second-degree AV block, Mobitz type II, with LAFB, PVC, and poor R progression.  Chest x-ray is notable for stable borderline cardiomegaly and fine interstitial prominence, likely chronic interstitial changes.  Noncontrast head CT is negative for acute intracranial abnormality.  Chemistry panel is notable for a creatinine of 3.68, up from 1.79 in June.  Bilirubin is slightly elevated.  Ethanol level is 63.  Lactic acid is 2.1, troponin undetectable, UDS negative, and urinalysis suggestive of possible infection.  CBC features a mild leukopenia similar to June, and a mild normocytic anemia.  Patient was given a liter of normal saline, urine was sent for culture, and he was treated with empiric Rocephin in the ED.   Cardiology was consulted by the ED physician and recommends medical admission.  Review of Systems:  All other systems reviewed and apart from HPI, are negative.  Past Medical History:  Diagnosis Date  . Alcoholic peripheral neuropathy ([place])   . Allergy   . Arthritis   . Cataract    left eye cataract removed  . COPD (chronic obstructive pulmonary disease) ([place])    pt denies 02-03-17  . Detached retina    a. s/p surgical correction on the right.  . Ectopic atrial tachycardia ([place])    a. 08/2014 Echo: EF 55-60%, mildly dil LA.  Marland Kitchen Gout   . Hypercholesteremia   . Hypertension   . Left inguinal hernia    a. s/p repair ~ 10 yrs ago.  . Parkinson's disease ([place])   . Prostate cancer ([place])    a. s/p TURP.    Past Surgical History:  Procedure Laterality Date  . COLONOSCOPY    . COLONOSCOPY W/ POLYPECTOMY  2001, 2006  . HERNIA REPAIR    . PROSTATE SURGERY       reports that he quit smoking about 40 years ago. His smoking use included cigarettes. He has a 44.00 pack-year smoking history. He has never used smokeless tobacco. He reports that he does not drink alcohol or use drugs.  No Known Allergies  Family History  Problem Relation Age of Onset  . Stroke Father        died @ 18.  Marland Kitchen Heart attack Mother        died @ 71  . Stroke  Brother        died in his 51's  . Heart attack Brother        died in his 66's  . Colon cancer Neg Hx   . Esophageal cancer Neg Hx   . Rectal cancer Neg Hx   . Stomach cancer Neg Hx   . Prostate cancer Neg Hx      Prior to Admission medications   Medication Sig Start Date End Date Taking? Authorizing Provider  amLODipine-benazepril (LOTREL) 5-20 MG capsule TK 1 C PO D 04/21/18   [provider]  aspirin EC 81 MG tablet Take 81 mg by mouth daily.    [provider]  Desoximetasone 0.25 % LIQD Apply 0.25 sprays topically 2 (two) times daily. For 2 Weeks 04/24/18   [provider]  diltiazem (CARDIZEM CD) 120 MG 24 hr  capsule Take 1 capsule (120 mg total) by mouth daily. 09/29/14   Theora Gianotti, NP  feeding supplement, ENSURE ENLIVE, (ENSURE ENLIVE) LIQD Take 237 mLs by mouth 3 (three) times daily between meals. 08/08/15   Kelvin Cellar, MD  fenofibrate (TRICOR) 145 MG tablet TK 1 T PO QD 03/13/18   [provider]  folic acid (FOLVITE) 1 MG tablet Take 1 tablet (1 mg total) by mouth daily. 11/20/15   Thurnell Lose, MD  Multiple Vitamin (MULTIVITAMIN) tablet Take 1 tablet by mouth daily.    [provider]  Omega-3 Fatty Acids (FISH OIL) 1200 MG CAPS Take 2,400 mg by mouth daily.    [provider]  rosuvastatin (CRESTOR) 20 MG tablet TK 1 T PO QD 03/17/18   [provider]  triamcinolone cream (KENALOG) 0.1 % APPLY AA BID 04/23/18   [provider]    Physical Exam: Vitals:   06/18/18 1700 06/18/18 1715 06/18/18 1800 06/18/18 1815  BP: (!) 82/65 (!) 89/65 98/82 116/61  Pulse: 73 76 82 66  Resp: 16 (!) 21 16 (!) 21  Temp:      TempSrc:      SpO2: 99% 94% 100% 99%  Weight:      Height:          Constitutional: NAD, calm  Eyes: PERTLA, lids and conjunctivae normal ENMT: Mucous membranes are moist. Posterior pharynx clear of any exudate or lesions.   Neck: normal, supple, no masses, no thyromegaly Respiratory: clear to auscultation bilaterally, no wheezing. No accessory muscle use.  Cardiovascular: S1 & S2 heard, regular rate and rhythm. No extremity edema. No significant JVD. Abdomen: No distension, no tenderness, soft. Bowel sounds normal.  Musculoskeletal: no clubbing / cyanosis. No joint deformity upper and lower extremities.   Skin: no significant rashes, lesions, ulcers. Poor turgor.  Neurologic: CN 2-12 grossly intact. Sensation intact. Strength 5/5 in all 4 limbs.  Psychiatric: Alert and oriented x 3. Calm, cooperative.     Labs on Admission: I have personally reviewed following labs and imaging studies  CBC: Recent Labs  Lab  06/18/18 1809  WBC 3.2*  NEUTROABS 1.7  HGB 12.3*  HCT 36.8*  MCV 97.9  PLT 824   Basic Metabolic Panel: Recent Labs  Lab 06/18/18 1809  NA 135  K 4.6  CL 101  CO2 18*  GLUCOSE 113*  BUN 59*  CREATININE 3.68*  CALCIUM 9.5   GFR: Estimated Creatinine Clearance: 14.1 mL/min (A) (by C-G formula based on SCr of 3.68 mg/dL (H)). Liver Function Tests: Recent Labs  Lab 06/18/18 1809  AST 49*  ALT 27  ALKPHOS 24*  BILITOT 1.6*  PROT 6.0*  ALBUMIN 3.4*   No results for input(s): LIPASE, AMYLASE in the last 168 hours. No results for input(s): AMMONIA in the last 168 hours. Coagulation Profile: Recent Labs  Lab 06/18/18 1809  INR 1.05   Cardiac Enzymes: Recent Labs  Lab 06/18/18 1809  TROPONINI <0.03   BNP (last 3 results) No results for input(s): PROBNP in the last 8760 hours. HbA1C: No results for input(s): HGBA1C in the last 72 hours. CBG: Recent Labs  Lab 06/18/18 1655  GLUCAP 119*   Lipid Profile: No results for input(s): CHOL, HDL, LDLCALC, TRIG, CHOLHDL, LDLDIRECT in the last 72 hours. Thyroid Function Tests: No results for input(s): TSH, T4TOTAL, FREET4, T3FREE, THYROIDAB in the last 72 hours. Anemia Panel: No results for input(s): VITAMINB12, FOLATE, FERRITIN, TIBC, IRON, RETICCTPCT in the last 72 hours. Urine analysis:    Component Value Date/Time   COLORURINE YELLOW 06/18/2018 1827   APPEARANCEUR HAZY (A) 06/18/2018 1827   LABSPEC 1.014 06/18/2018 1827   PHURINE 5.0 06/18/2018 1827   GLUCOSEU 50 (A) 06/18/2018 1827   HGBUR MODERATE (A) 06/18/2018 1827   BILIRUBINUR NEGATIVE 06/18/2018 1827   KETONESUR NEGATIVE 06/18/2018 1827   PROTEINUR 30 (A) 06/18/2018 1827   UROBILINOGEN 1.0 08/05/2015 1155   NITRITE NEGATIVE 06/18/2018 1827   LEUKOCYTESUR TRACE (A) 06/18/2018 1827   Sepsis Labs: @LABRCNTIP (procalcitonin:4,lacticidven:4) )No results found for this or any previous visit (from the past 240 hour(s)).   Radiological Exams on  Admission: Dg Chest 2 View  Result Date: 06/18/2018 CLINICAL DATA:  Syncopal episode in the kitchen. EXAM: CHEST - 2 VIEW COMPARISON:  04/30/2018 CXR and chest CT FINDINGS: Stable borderline cardiomegaly and aortic atherosclerosis. No acute pulmonary consolidation, effusion or edema. No acute osseous abnormality. Stable bilateral fine interstitial lung markings that may reflect chronic interstitial lung changes are redemonstrated. IMPRESSION: 1. Stable borderline cardiomegaly with aortic atherosclerosis. 2. Fine interstitial prominence of the lungs likely reflecting chronic interstitial change/fibrosis. 3. No active pulmonary disease. Electronically Signed   By: Ashley Royalty M.D.   On: 06/18/2018 18:12   Ct Head Wo Contrast  Result Date: 06/18/2018 CLINICAL DATA:  Syncopal episode today with hypotension. EXAM: CT HEAD WITHOUT CONTRAST TECHNIQUE: Contiguous axial images were obtained from the base of the skull through the vertex without intravenous contrast. COMPARISON:  11/10/2015 FINDINGS: Brain: Moderate-to-marked sulcal and ventricular prominence consistent with cerebral atrophy is redemonstrated with moderate small vessel ischemic disease. No acute intracranial hemorrhage, mass, midline shift or edema. Midline fourth ventricle and basal cisterns without effacement. No extra-axial fluid collections. No acute intracranial hemorrhage. Vascular: Moderate atherosclerosis of the carotid siphons. Skull: Negative for acute fracture or suspicious osseous lesions. Sinuses/Orbits: No acute finding. Cataract surgical change on the left with glaucoma reservoir or scleral band on the right as before. Other: None IMPRESSION: Chronic stable cerebral atrophy and moderate small vessel ischemia. No acute intracranial abnormality. Electronically Signed   By: Ashley Royalty M.D.   On: 06/18/2018 18:01    EKG: Independently reviewed. Mobitz type II AV block, LAFB, PVC, poor R-progression.   Assessment/Plan   1. Syncope  -  Patient presents following a syncopal episode at home that was preceded by 2-3 weeks of poor appetite and very little intake  - He denies chest pain or palpitations but reports lightheadedness on standing  - EKG was concerning for Mobitz type II block; cardiology is consulting and much appreciated  - He is hypovolemic with hypotension on arrival that resolved  with 1 liter NS  - Continue cardiac monitoring, check orthostatic vitals and echo, hold antihypertensives initially, continue IVF hydration    2. AV block  - Admission EKG concerning for Mobitz type II AV block  - Cardiology consulting and much appreciated  - Continue cardiac monitoring, follow-up echo, will hold CCB for now   3. Hypotension  - SBP in 70's initially in ED, likely d/t hypovolemia and resolved with 1 liter NS  - Does not appear septic, denies chest pain or headache, is alert and fully oriented  - Continue IVF hydration, continue to hold antihypertensives for now   4. UTI  - UA is suggestive of UTI, pt is afebrile, there is mild leukopenia  - Sample sent for culture and empiric Rocephin started  - Continue Rocephin, follow culture   5. Alcohol dependence  - Patient reports daily drinking, has difficulty quantifying  - EtOH level 63 in ED, does not appear to be in withdrawal or acute intoxication  - Monitor with CIWA and prn Ativan, supplement b-vitamins    6. Acute kidney injury superimposed on CKD III  - SCr is 3.68 on admission in setting of hypovolemia and poor intake for the past couple weeks; creatinine was 1.79 in June   - Likely a prerenal azotemia, possibly exacerbated by continued ACE-i use - Renally-dose medications, avoid nephrotoxins, check renal US and urine chemistries, repeat chem panel in am    7. Leukopenia; anemia  - WBC is 3.2 on admission, similar to value in June and likely secondary to EtOH, possibly from UTI  - Hgb is 12.3 on admission with no overt bleeding  - Check B12 and folate      DVT prophylaxis: sq heparin   Code Status: DNR  Family Communication: Discussed with patient  Consults called: Cardiology Admission status: Observation     Vianne Bulls, MD [facility] Pager [phone]  If 7PM-7AM, please contact night-coverage [email or web address] Password [facility]  06/18/2018, 7:53 PM

## 2018-06-18 NOTE — ED Notes (Addendum)
Pt refusing in and out cath.  Pt notified that a urine sample is needed.  Pt states he will attempt to pee in a urinal after the fluids are complete.

## 2018-06-19 ENCOUNTER — Ambulatory Visit (HOSPITAL_BASED_OUTPATIENT_CLINIC_OR_DEPARTMENT_OTHER): Payer: Medicare Other

## 2018-06-19 DIAGNOSIS — E861 Hypovolemia: Secondary | ICD-10-CM

## 2018-06-19 DIAGNOSIS — E785 Hyperlipidemia, unspecified: Secondary | ICD-10-CM | POA: Diagnosis present

## 2018-06-19 DIAGNOSIS — I361 Nonrheumatic tricuspid (valve) insufficiency: Secondary | ICD-10-CM

## 2018-06-19 DIAGNOSIS — I440 Atrioventricular block, first degree: Secondary | ICD-10-CM | POA: Diagnosis not present

## 2018-06-19 DIAGNOSIS — R55 Syncope and collapse: Secondary | ICD-10-CM | POA: Diagnosis not present

## 2018-06-19 DIAGNOSIS — I441 Atrioventricular block, second degree: Secondary | ICD-10-CM | POA: Diagnosis present

## 2018-06-19 DIAGNOSIS — E7800 Pure hypercholesterolemia, unspecified: Secondary | ICD-10-CM | POA: Diagnosis present

## 2018-06-19 DIAGNOSIS — N390 Urinary tract infection, site not specified: Secondary | ICD-10-CM | POA: Diagnosis present

## 2018-06-19 DIAGNOSIS — I9589 Other hypotension: Secondary | ICD-10-CM

## 2018-06-19 DIAGNOSIS — Z87891 Personal history of nicotine dependence: Secondary | ICD-10-CM | POA: Diagnosis not present

## 2018-06-19 DIAGNOSIS — G20 Parkinson's disease: Secondary | ICD-10-CM | POA: Diagnosis present

## 2018-06-19 DIAGNOSIS — J449 Chronic obstructive pulmonary disease, unspecified: Secondary | ICD-10-CM | POA: Diagnosis present

## 2018-06-19 DIAGNOSIS — T461X5A Adverse effect of calcium-channel blockers, initial encounter: Secondary | ICD-10-CM | POA: Diagnosis present

## 2018-06-19 DIAGNOSIS — Z7982 Long term (current) use of aspirin: Secondary | ICD-10-CM | POA: Diagnosis not present

## 2018-06-19 DIAGNOSIS — Z66 Do not resuscitate: Secondary | ICD-10-CM | POA: Diagnosis present

## 2018-06-19 DIAGNOSIS — Z9079 Acquired absence of other genital organ(s): Secondary | ICD-10-CM | POA: Diagnosis not present

## 2018-06-19 DIAGNOSIS — D649 Anemia, unspecified: Secondary | ICD-10-CM | POA: Diagnosis present

## 2018-06-19 DIAGNOSIS — Z8546 Personal history of malignant neoplasm of prostate: Secondary | ICD-10-CM | POA: Diagnosis not present

## 2018-06-19 DIAGNOSIS — F1020 Alcohol dependence, uncomplicated: Secondary | ICD-10-CM | POA: Diagnosis present

## 2018-06-19 DIAGNOSIS — I129 Hypertensive chronic kidney disease with stage 1 through stage 4 chronic kidney disease, or unspecified chronic kidney disease: Secondary | ICD-10-CM | POA: Diagnosis present

## 2018-06-19 DIAGNOSIS — T464X5A Adverse effect of angiotensin-converting-enzyme inhibitors, initial encounter: Secondary | ICD-10-CM | POA: Diagnosis present

## 2018-06-19 DIAGNOSIS — N183 Chronic kidney disease, stage 3 (moderate): Secondary | ICD-10-CM | POA: Diagnosis present

## 2018-06-19 DIAGNOSIS — E860 Dehydration: Secondary | ICD-10-CM | POA: Diagnosis present

## 2018-06-19 DIAGNOSIS — N179 Acute kidney failure, unspecified: Secondary | ICD-10-CM | POA: Diagnosis present

## 2018-06-19 DIAGNOSIS — D72819 Decreased white blood cell count, unspecified: Secondary | ICD-10-CM | POA: Diagnosis present

## 2018-06-19 DIAGNOSIS — Z79899 Other long term (current) drug therapy: Secondary | ICD-10-CM | POA: Diagnosis not present

## 2018-06-19 LAB — CBC WITH DIFFERENTIAL/PLATELET
ABS IMMATURE GRANULOCYTES: 0.1 10*3/uL (ref 0.0–0.1)
BASOS ABS: 0 10*3/uL (ref 0.0–0.1)
BASOS PCT: 1 %
Eosinophils Absolute: 0.1 10*3/uL (ref 0.0–0.7)
Eosinophils Relative: 2 %
HCT: 37.1 % — ABNORMAL LOW (ref 39.0–52.0)
Hemoglobin: 12.4 g/dL — ABNORMAL LOW (ref 13.0–17.0)
IMMATURE GRANULOCYTES: 2 %
Lymphocytes Relative: 16 %
Lymphs Abs: 0.5 10*3/uL — ABNORMAL LOW (ref 0.7–4.0)
MCH: 32.5 pg (ref 26.0–34.0)
MCHC: 33.4 g/dL (ref 30.0–36.0)
MCV: 97.1 fL (ref 78.0–100.0)
MONO ABS: 0.5 10*3/uL (ref 0.1–1.0)
Monocytes Relative: 15 %
NEUTROS PCT: 64 %
Neutro Abs: 2.1 10*3/uL (ref 1.7–7.7)
PLATELETS: 150 10*3/uL (ref 150–400)
RBC: 3.82 MIL/uL — ABNORMAL LOW (ref 4.22–5.81)
RDW: 12.3 % (ref 11.5–15.5)
WBC: 3.2 10*3/uL — AB (ref 4.0–10.5)

## 2018-06-19 LAB — COMPREHENSIVE METABOLIC PANEL
ALK PHOS: 27 U/L — AB (ref 38–126)
ALT: 22 U/L (ref 0–44)
AST: 40 U/L (ref 15–41)
Albumin: 3.1 g/dL — ABNORMAL LOW (ref 3.5–5.0)
Anion gap: 10 (ref 5–15)
BILIRUBIN TOTAL: 1 mg/dL (ref 0.3–1.2)
BUN: 52 mg/dL — ABNORMAL HIGH (ref 8–23)
CALCIUM: 9.1 mg/dL (ref 8.9–10.3)
CO2: 24 mmol/L (ref 22–32)
Chloride: 104 mmol/L (ref 98–111)
Creatinine, Ser: 2.82 mg/dL — ABNORMAL HIGH (ref 0.61–1.24)
GFR, EST AFRICAN AMERICAN: 22 mL/min — AB (ref >60)
GFR, EST NON AFRICAN AMERICAN: 19 mL/min — AB (ref >60)
Glucose, Bld: 103 mg/dL — ABNORMAL HIGH (ref 70–99)
Potassium: 4.1 mmol/L (ref 3.5–5.1)
Sodium: 138 mmol/L (ref 135–145)
TOTAL PROTEIN: 5.7 g/dL — AB (ref 6.5–8.1)

## 2018-06-19 LAB — ECHOCARDIOGRAM COMPLETE
Height: 69 in
WEIGHTICAEL: 2366.4 [oz_av]

## 2018-06-19 LAB — VITAMIN B12: VITAMIN B 12: 721 pg/mL (ref 180–914)

## 2018-06-19 MED ORDER — SODIUM CHLORIDE 0.9 % IV SOLN
INTRAVENOUS | Status: AC
  Administered 2018-06-19 (×2): via INTRAVENOUS

## 2018-06-19 MED ORDER — SODIUM CHLORIDE 0.9 % IV SOLN
1.0000 g | Freq: Once | INTRAVENOUS | Status: AC
  Administered 2018-06-19: 1 g via INTRAVENOUS
  Filled 2018-06-19 (×2): qty 10

## 2018-06-19 NOTE — Progress Notes (Signed)
Progress Note  Patient Name: Victor Clarke Date of Encounter: 06/19/2018  Primary Cardiologist: Buford Dresser, MD   Subjective   Pt denies further dizziness, didn't get rest last night.  Inpatient Medications    Scheduled Meds: . aspirin EC  81 mg Oral Daily  . feeding supplement (ENSURE ENLIVE)  237 mL Oral TID BM  . folic acid  1 mg Oral Daily  . heparin  5,000 Units Subcutaneous Q8H  . LORazepam  0-4 mg Oral Q6H   Followed by  . [START ON 06/20/2018] LORazepam  0-4 mg Oral Q12H  . multivitamin with minerals  1 tablet Oral Daily  . omega-3 acid ethyl esters  2 g Oral Daily  . rosuvastatin  20 mg Oral q1800  . sodium chloride flush  3 mL Intravenous Q12H  . thiamine  100 mg Oral Daily   Or  . thiamine  100 mg Intravenous Daily   Continuous Infusions:  PRN Meds: acetaminophen **OR** acetaminophen, bisacodyl, LORazepam **OR** LORazepam, ondansetron **OR** ondansetron (ZOFRAN) IV, oxyCODONE, senna-docusate   Vital Signs    Vitals:   06/18/18 1815 06/18/18 2000 06/18/18 2100 06/19/18 0500  BP: 116/61 121/86 96/84   Pulse: 66 65 71 72  Resp: (!) 21 18 18 18   Temp:   98.1 F (36.7 C) 98.5 F (36.9 C)  TempSrc:   Oral Oral  SpO2: 99% 100% 93% 99%  Weight:   144 lb 4.8 oz (65.5 kg) 147 lb 14.4 oz (67.1 kg)  Height:   5\' 9"  (1.753 m)     Intake/Output Summary (Last 24 hours) at 06/19/2018 0839 Last data filed at 06/19/2018 0000 Gross per 24 hour  Intake 71.38 ml  Output -  Net 71.38 ml   Filed Weights   06/18/18 1651 06/18/18 2100 06/19/18 0500  Weight: 160 lb (72.6 kg) 144 lb 4.8 oz (65.5 kg) 147 lb 14.4 oz (67.1 kg)    Telemetry    1st degree heart block - Personally Reviewed  ECG    No new tracings - Personally Reviewed  Physical Exam   GEN: No acute distress.   Neck: No JVD Cardiac: RRR, no murmurs, rubs, or gallops.  Respiratory: Clear to auscultation bilaterally. GI: Soft, nontender, non-distended  MS: No edema; No deformity. Neuro:   Nonfocal  Psych: Normal affect   Labs    Chemistry Recent Labs  Lab 06/18/18 1809 06/19/18 0338  NA 135 138  K 4.6 4.1  CL 101 104  CO2 18* 24  GLUCOSE 113* 103*  BUN 59* 52*  CREATININE 3.68* 2.82*  CALCIUM 9.5 9.1  PROT 6.0* 5.7*  ALBUMIN 3.4* 3.1*  AST 49* 40  ALT 27 22  ALKPHOS 24* 27*  BILITOT 1.6* 1.0  GFRNONAA 14* 19*  GFRAA 16* 22*  ANIONGAP 16* 10     Hematology Recent Labs  Lab 06/18/18 1809 06/19/18 0338  WBC 3.2* 3.2*  RBC 3.76* 3.82*  HGB 12.3* 12.4*  HCT 36.8* 37.1*  MCV 97.9 97.1  MCH 32.7 32.5  MCHC 33.4 33.4  RDW 12.3 12.3  PLT 154 150    Cardiac Enzymes Recent Labs  Lab 06/18/18 1809  TROPONINI <0.03   No results for input(s): TROPIPOC in the last 168 hours.   BNPNo results for input(s): BNP, PROBNP in the last 168 hours.   DDimer No results for input(s): DDIMER in the last 168 hours.   Radiology    Dg Chest 2 View  Result Date: 06/18/2018 CLINICAL DATA:  Syncopal  episode in the kitchen. EXAM: CHEST - 2 VIEW COMPARISON:  04/30/2018 CXR and chest CT FINDINGS: Stable borderline cardiomegaly and aortic atherosclerosis. No acute pulmonary consolidation, effusion or edema. No acute osseous abnormality. Stable bilateral fine interstitial lung markings that may reflect chronic interstitial lung changes are redemonstrated. IMPRESSION: 1. Stable borderline cardiomegaly with aortic atherosclerosis. 2. Fine interstitial prominence of the lungs likely reflecting chronic interstitial change/fibrosis. 3. No active pulmonary disease. Electronically Signed   By: Ashley Royalty M.D.   On: 06/18/2018 18:12   Ct Head Wo Contrast  Result Date: 06/18/2018 CLINICAL DATA:  Syncopal episode today with hypotension. EXAM: CT HEAD WITHOUT CONTRAST TECHNIQUE: Contiguous axial images were obtained from the base of the skull through the vertex without intravenous contrast. COMPARISON:  11/10/2015 FINDINGS: Brain: Moderate-to-marked sulcal and ventricular prominence  consistent with cerebral atrophy is redemonstrated with moderate small vessel ischemic disease. No acute intracranial hemorrhage, mass, midline shift or edema. Midline fourth ventricle and basal cisterns without effacement. No extra-axial fluid collections. No acute intracranial hemorrhage. Vascular: Moderate atherosclerosis of the carotid siphons. Skull: Negative for acute fracture or suspicious osseous lesions. Sinuses/Orbits: No acute finding. Cataract surgical change on the left with glaucoma reservoir or scleral band on the right as before. Other: None IMPRESSION: Chronic stable cerebral atrophy and moderate small vessel ischemia. No acute intracranial abnormality. Electronically Signed   By: Ashley Royalty M.D.   On: 06/18/2018 18:01   US Renal  Result Date: 06/18/2018 CLINICAL DATA:  84 y/o  M; acute renal failure.  History of CKD 3. EXAM: RENAL / URINARY TRACT ULTRASOUND COMPLETE COMPARISON:  None. FINDINGS: Right Kidney: Length: 10.6 cm. Mild increased echogenicity and thinning of renal cortex. Lower pole simple appearing 1.1 cm cyst. Left Kidney: Length: 11.9 cm. Mildly increased echogenicity and thinning of renal cortex. Cortical thinning. Simple parapelvic cysts measuring up to 7.9 cm. Bladder: Appears normal for degree of bladder distention. Bilateral ureteral jets noted. IMPRESSION: Mildly increased echogenicity and thinning of the bilateral renal cortices compatible with medical renal disease. Bilateral simple cysts. No acute process identified. Electronically Signed   By: Kristine Garbe M.D.   On: 06/18/2018 22:04    Cardiac Studies   Echo - pending today  Patient Profile     84 y.o. male with a hx of prostate cancer s/p TURP, COPD, gout, HTN, HLD, alcohol use who is being evaluated for syncope  Assessment & Plan    1. Syncope  - telemetry with first degree heart block - echo pending read - orthostatics negative for hypotension - no further dizziness    2. Possible  alcohol abuse - CIWA per primary      For questions or updates, please contact [place] Please consult [email or web address] for contact info under Cardiology/STEMI.      Signed, Tami Lin Duke, PA  06/19/2018, 8:39 AM

## 2018-06-19 NOTE — Progress Notes (Signed)
PROGRESS NOTE    Victor Clarke  BMW:[id number] DOB: December 19, 1928 DOA: 06/18/2018 PCP: [facility], FNP    Brief Narrative:  84 year old with past medical history relevant for hypertension, hyperlipidemia, COPD, alcohol use, prostate cancer status TURP, stage III CKD admitted with syncopal episode and AKI on CKD with possible Mobitz 2 AV block.   Assessment & Plan:   Principal Problem:   Syncope Active Problems:   Hypertension   Dehydration   UTI (urinary tract infection)   Anemia   Alcohol dependence (HCC)   Acute renal failure superimposed on stage 3 chronic kidney disease (HCC)   Leukopenia   AV block, Mobitz II   #) Syncope: Patient reports that he has not been eating well and was feeling poorly.  He is also been on quite a few blood pressure medications.  He then had a syncopal episode and was noted to be quite hypotensive.  This was initially attributed to dehydration however EKG did show what appeared to be a brief run of AV block Mobitz 2 in the emergency department.  At this time on telemetry he does not have any significant evidence of a block but does have multiple PVCs. -Cardiology following, pending recommendations -Echo completed on 06/18/2018, pending read -Hold home antihypertensives -IV fluids -Repeat orthostatics after IV fluids  #) Acute on chronic stage III CKD: Baseline creatinine appears to be around 1.8.  His creatinine kidneys to be quite elevated suggesting strongly that the patient likely had significant hypovolemia compounding his significant antihypertensives. -IV fluids -Hold nephrotoxins  #) Hypertension/hyperlipidemia: -Hold amlodipine 5 mg daily -Hold benazepril 20 mg daily - Continue aspirin 81 mg -Continue rosuvastatin 20 mg daily -Hold fenofibrate 140 mg daily  #) Alcohol abuse: Patient denies any history of complicated withdrawals - CIWA protocol -Continue with thiamine and folate supplementation  Fluids: Gentle IV  fluids Electrolytes: Monitor and supplement Nutrition: Heart healthy diet   prophylaxis: Subcu heparin  Disposition: Pending resolution of hypertension and normal echo  DO NOT RESUSCITATE   Consultants:   Cardiology  Procedures:   Echo 06/18/2018: Pending read  Antimicrobials:   None   Subjective: Patient reports he is doing fairly well.  He denies any chest pain, palpitations, nausea, vomiting, diarrhea, abdominal pain, cough, congestion.  Objective: Vitals:   06/18/18 2000 06/18/18 2100 06/19/18 0500 06/19/18 0903  BP: 121/86 96/84  96/61  Pulse: 65 71 72   Resp: 18 18 18 19   Temp:  98.1 F (36.7 C) 98.5 F (36.9 C)   TempSrc:  Oral Oral   SpO2: 100% 93% 99%   Weight:  65.5 kg (144 lb 4.8 oz) 67.1 kg (147 lb 14.4 oz)   Height:  5\' 9"  (1.753 m)      Intake/Output Summary (Last 24 hours) at 06/19/2018 1121 Last data filed at 06/19/2018 0000 Gross per 24 hour  Intake 71.38 ml  Output -  Net 71.38 ml   Filed Weights   06/18/18 1651 06/18/18 2100 06/19/18 0500  Weight: 72.6 kg (160 lb) 65.5 kg (144 lb 4.8 oz) 67.1 kg (147 lb 14.4 oz)    Examination:  General exam: Appears calm and comfortable  Respiratory system: Clear to auscultation. Respiratory effort normal. Cardiovascular system: Irregularly regular rhythm, no murmurs Gastrointestinal system: Abdomen is nondistended, soft and nontender. No organomegaly or masses felt. Normal bowel sounds heard. Central nervous system: Alert and oriented. No focal neurological deficits. Extremities: Trace lower extremity edema Skin: No rashes over visible skin Psychiatry: Judgement and insight appear  normal. Mood & affect appropriate.     Data Reviewed:  CBC: Recent Labs  Lab 06/18/18 1809 06/19/18 0338  WBC 3.2* 3.2*  NEUTROABS 1.7 2.1  HGB 12.3* 12.4*  HCT 36.8* 37.1*  MCV 97.9 97.1  PLT 154 258   Basic Metabolic Panel: Recent Labs  Lab 06/18/18 1809 06/19/18 0338  NA 135 138  K 4.6 4.1  CL 101 104   CO2 18* 24  GLUCOSE 113* 103*  BUN 59* 52*  CREATININE 3.68* 2.82*  CALCIUM 9.5 9.1   GFR: Estimated Creatinine Clearance: 17.5 mL/min (A) (by C-G formula based on SCr of 2.82 mg/dL (H)). Liver Function Tests: Recent Labs  Lab 06/18/18 1809 06/19/18 0338  AST 49* 40  ALT 27 22  ALKPHOS 24* 27*  BILITOT 1.6* 1.0  PROT 6.0* 5.7*  ALBUMIN 3.4* 3.1*   No results for input(s): LIPASE, AMYLASE in the last 168 hours. No results for input(s): AMMONIA in the last 168 hours. Coagulation Profile: Recent Labs  Lab 06/18/18 1809  INR 1.05   Cardiac Enzymes: Recent Labs  Lab 06/18/18 1809  TROPONINI <0.03   BNP (last 3 results) No results for input(s): PROBNP in the last 8760 hours. HbA1C: No results for input(s): HGBA1C in the last 72 hours. CBG: Recent Labs  Lab 06/18/18 1655  GLUCAP 119*   Lipid Profile: No results for input(s): CHOL, HDL, LDLCALC, TRIG, CHOLHDL, LDLDIRECT in the last 72 hours. Thyroid Function Tests: No results for input(s): TSH, T4TOTAL, FREET4, T3FREE, THYROIDAB in the last 72 hours. Anemia Panel: Recent Labs    06/19/18 0338  [id number]   Sepsis Labs: Recent Labs  Lab 06/18/18 1809 06/18/18 2207  LATICACIDVEN 2.1* 1.2    Recent Results (from the past 240 hour(s))  MRSA PCR Screening     Status: None   Collection Time: 06/18/18  8:35 PM  Result Value Ref Range Status   MRSA by PCR NEGATIVE NEGATIVE Final    Comment:        The GeneXpert MRSA Assay (FDA approved for NASAL specimens only), is one component of a comprehensive MRSA colonization surveillance program. It is not intended to diagnose MRSA infection nor to guide or monitor treatment for MRSA infections. Performed at [facility], [place] [address] [place], [place] [address]          Radiology Studies: Dg Chest 2 View  Result Date: 06/18/2018 CLINICAL DATA:  Syncopal episode in the kitchen. EXAM: CHEST - 2 VIEW COMPARISON:  04/30/2018 CXR and chest CT  FINDINGS: Stable borderline cardiomegaly and aortic atherosclerosis. No acute pulmonary consolidation, effusion or edema. No acute osseous abnormality. Stable bilateral fine interstitial lung markings that may reflect chronic interstitial lung changes are redemonstrated. IMPRESSION: 1. Stable borderline cardiomegaly with aortic atherosclerosis. 2. Fine interstitial prominence of the lungs likely reflecting chronic interstitial change/fibrosis. 3. No active pulmonary disease. Electronically Signed   By: Ashley Royalty M.D.   On: 06/18/2018 18:12   Ct Head Wo Contrast  Result Date: 06/18/2018 CLINICAL DATA:  Syncopal episode today with hypotension. EXAM: CT HEAD WITHOUT CONTRAST TECHNIQUE: Contiguous axial images were obtained from the base of the skull through the vertex without intravenous contrast. COMPARISON:  11/10/2015 FINDINGS: Brain: Moderate-to-marked sulcal and ventricular prominence consistent with cerebral atrophy is redemonstrated with moderate small vessel ischemic disease. No acute intracranial hemorrhage, mass, midline shift or edema. Midline fourth ventricle and basal cisterns without effacement. No extra-axial fluid collections. No acute intracranial hemorrhage. Vascular: Moderate atherosclerosis of  the carotid siphons. Skull: Negative for acute fracture or suspicious osseous lesions. Sinuses/Orbits: No acute finding. Cataract surgical change on the left with glaucoma reservoir or scleral band on the right as before. Other: None IMPRESSION: Chronic stable cerebral atrophy and moderate small vessel ischemia. No acute intracranial abnormality. Electronically Signed   By: Ashley Royalty M.D.   On: 06/18/2018 18:01   US Renal  Result Date: 06/18/2018 CLINICAL DATA:  84 y/o  M; acute renal failure.  History of CKD 3. EXAM: RENAL / URINARY TRACT ULTRASOUND COMPLETE COMPARISON:  None. FINDINGS: Right Kidney: Length: 10.6 cm. Mild increased echogenicity and thinning of renal cortex. Lower pole simple  appearing 1.1 cm cyst. Left Kidney: Length: 11.9 cm. Mildly increased echogenicity and thinning of renal cortex. Cortical thinning. Simple parapelvic cysts measuring up to 7.9 cm. Bladder: Appears normal for degree of bladder distention. Bilateral ureteral jets noted. IMPRESSION: Mildly increased echogenicity and thinning of the bilateral renal cortices compatible with medical renal disease. Bilateral simple cysts. No acute process identified. Electronically Signed   By: Kristine Garbe M.D.   On: 06/18/2018 22:04        Scheduled Meds: . aspirin EC  81 mg Oral Daily  . feeding supplement (ENSURE ENLIVE)  237 mL Oral TID BM  . folic acid  1 mg Oral Daily  . heparin  5,000 Units Subcutaneous Q8H  . LORazepam  0-4 mg Oral Q6H   Followed by  . [START ON 06/20/2018] LORazepam  0-4 mg Oral Q12H  . multivitamin with minerals  1 tablet Oral Daily  . omega-3 acid ethyl esters  2 g Oral Daily  . rosuvastatin  20 mg Oral q1800  . sodium chloride flush  3 mL Intravenous Q12H  . thiamine  100 mg Oral Daily   Or  . thiamine  100 mg Intravenous Daily   Continuous Infusions:   LOS: 0 days    Time spent: [place], MD [facility]  If 7PM-7AM, please contact night-coverage [email or web address] Password [email or web address] 06/19/2018, 11:21 AM

## 2018-06-19 NOTE — Progress Notes (Signed)
Initial Nutrition Assessment  DOCUMENTATION CODES:   Non-severe (moderate) malnutrition in context of chronic illness  INTERVENTION:    Ensure Enlive po BID, each supplement provides 350 kcal and 20 grams of protein  NUTRITION DIAGNOSIS:   Moderate Malnutrition related to chronic illness(COPD, CKD) as evidenced by mild fat depletion, moderate fat depletion, mild muscle depletion, moderate muscle depletion, percent weight loss(8% weight loss within 3 months).  GOAL:   Patient will meet greater than or equal to 90% of their needs  MONITOR:   PO intake, Supplement acceptance  REASON FOR ASSESSMENT:   Malnutrition Screening Tool    ASSESSMENT:   84 yo male with PMH of HTN, HLD, Parkinson's DZ, COPD, former alcohol abuse who was admitted on 8/1 with AKI, syncope.  Patient reports poor intake for the past 7-10 days due to poor appetite. Prior to that, he was eating well. He enjoys drinking Ensure and has asked RN to bring him one.   Per review of weight encounters, patient has had 8% weight loss within the past 2 months. Patient says he weighed ~170 lbs while he was in the ALF (unsure exactly when), but since he returned home, has not been eating as well because he is a "lazy cook."    Labs reviewed. Medications reviewed and include folic acid, MVI, and thiamine.    NUTRITION - FOCUSED PHYSICAL EXAM:    Most Recent Value  Orbital Region  No depletion  Upper Arm Region  Moderate depletion  Thoracic and Lumbar Region  Mild depletion  Buccal Region  Mild depletion  Temple Region  Mild depletion  Clavicle Bone Region  Mild depletion  Clavicle and Acromion Bone Region  Mild depletion  Scapular Bone Region  Mild depletion  Dorsal Hand  Moderate depletion  Patellar Region  Moderate depletion  Anterior Thigh Region  Moderate depletion  Posterior Calf Region  Mild depletion  Edema (RD Assessment)  None  Hair  Reviewed  Eyes  Reviewed  Mouth  Reviewed  Skin  Reviewed  Nails   Reviewed       Diet Order:   Diet Order           Diet Heart Room service appropriate? Yes; Fluid consistency: Thin  Diet effective now          EDUCATION NEEDS:   No education needs have been identified at this time  Skin:  Skin Assessment: Reviewed RN Assessment  Last BM:  7/31  Height:   Ht Readings from Last 1 Encounters:  06/18/18 5\' 9"  (1.753 m)    Weight:   Wt Readings from Last 1 Encounters:  06/19/18 147 lb 14.4 oz (67.1 kg)    Ideal Body Weight:  72.7 kg  BMI:  Body mass index is 21.84 kg/m.  Estimated Nutritional Needs:   Kcal:  1800-2000  Protein:  80-100 gm  Fluid:  1.8 L    Molli Barrows, RD, LDN, CNSC Pager [phone] After Hours Pager [phone]

## 2018-06-19 NOTE — Clinical Social Work Note (Signed)
Clinical Social Work Assessment  Patient Details  Name: FAHEEM ZIEMANN MRN: [id number] Date of Birth: Jul 07, 1929  Date of referral:  06/19/18               Reason for consult:  Substance Use/ETOH Abuse, Family Concerns                Permission sought to share information with:    Permission granted to share information::     Name::        Agency::     Relationship::     Contact Information:     Housing/Transportation Living arrangements for the past 2 months:  Single Family Home Source of Information:  Patient Patient Interpreter Needed:  None Criminal Activity/Legal Involvement Pertinent to Current Situation/Hospitalization:  No - Comment as needed Significant Relationships:  Other Family Members, Friend Lives with:  Self Do you feel safe going back to the place where you live?  Yes Need for family participation in patient care:  No (Coment)  Care giving concerns: Patient from home independently. CSW consulted for lack of social support and patient on CIWA.    Social Worker assessment / plan:  CSW met with patient at bedside. Patient alert and oriented. CSW introduced self and role and assessed social supports, living situation and substance use. Patient reports he lives alone. His wife died twelve years ago. Patient does not have any family in the area; his brother and nieces live out of state. Patient reported that he lived in an ALF previously [facility]) but returned home about a year ago. He reports having some trouble managing at home, but does not want to return to the ALF. He has some friends that still live there that he visits a couple times a week. Patient considering an in home aide, but would is not sure how to find an agency.   Patient reports he drinks 1-2 drinks per day (gin) and does not plan to change his use. Patient states it relaxes him when he feels stressed out or anxious. Patient alluded vaguely to the idea that drinking negatively affects his health,  though sustained that he would continue having a drink or two in the evenings.  CSW will provide resources for ALF and home health aide. Patient also open to home health PT if it is indicated.   CSW signing off. Please re-consult if needed.  Employment status:  Retired Forensic scientist:  Medicare PT Recommendations:  Not assessed at this time [place] / Referral to community resources:  Other (Comment Required)(ALFs)  Patient/Family's Response to care: Patient appreciative of care.  Patient/Family's Understanding of and Emotional Response to Diagnosis, Current Treatment, and Prognosis: Patient with understanding of condition and hopeful to go home over the weekend.  Emotional Assessment Appearance:  Appears stated age Attitude/Demeanor/Rapport:  Engaged Affect (typically observed):  Accepting, Calm, Pleasant Orientation:  Oriented to Self, Oriented to Place, Oriented to  Time, Oriented to Situation Alcohol / Substance use:  Not Applicable Psych involvement (Current and /or in the community):  No (Comment)  Discharge Needs  Concerns to be addressed:  Lack of Support, Substance Abuse Concerns Readmission within the last 30 days:  No Current discharge risk:  Substance Abuse, Lives alone Barriers to Discharge:  Continued Medical Work up   Estanislado Emms, LCSW 06/19/2018, 4:45 PM

## 2018-06-19 NOTE — Progress Notes (Signed)
  Echocardiogram 2D Echocardiogram has been performed.  Johny Chess 06/19/2018, 12:18 PM

## 2018-06-20 LAB — CBC
HCT: 35.8 % — ABNORMAL LOW (ref 39.0–52.0)
Hemoglobin: 11.8 g/dL — ABNORMAL LOW (ref 13.0–17.0)
MCH: 32.3 pg (ref 26.0–34.0)
MCHC: 33 g/dL (ref 30.0–36.0)
MCV: 98.1 fL (ref 78.0–100.0)
Platelets: 129 K/uL — ABNORMAL LOW (ref 150–400)
RBC: 3.65 MIL/uL — ABNORMAL LOW (ref 4.22–5.81)
RDW: 12.3 % (ref 11.5–15.5)
WBC: 2.4 10*3/uL — ABNORMAL LOW (ref 4.0–10.5)

## 2018-06-20 LAB — URINE CULTURE: Culture: >=100000 — AB

## 2018-06-20 LAB — BASIC METABOLIC PANEL WITH GFR
BUN: 31 mg/dL — ABNORMAL HIGH (ref 8–23)
Calcium: 9 mg/dL (ref 8.9–10.3)
Creatinine, Ser: 1.96 mg/dL — ABNORMAL HIGH (ref 0.61–1.24)
GFR calc non Af Amer: 29 mL/min — ABNORMAL LOW (ref >60)
Glucose, Bld: 96 mg/dL (ref 70–99)

## 2018-06-20 LAB — BASIC METABOLIC PANEL
Anion gap: 9 (ref 5–15)
CO2: 24 mmol/L (ref 22–32)
Chloride: 107 mmol/L (ref 98–111)
GFR calc Af Amer: 34 mL/min — ABNORMAL LOW (ref >60)
Potassium: 3.9 mmol/L (ref 3.5–5.1)
Sodium: 140 mmol/L (ref 135–145)

## 2018-06-20 LAB — MAGNESIUM: Magnesium: 1.4 mg/dL — ABNORMAL LOW (ref 1.7–2.4)

## 2018-06-20 LAB — GLUCOSE, CAPILLARY: Glucose-Capillary: 85 mg/dL (ref 70–99)

## 2018-06-20 MED ORDER — MAGNESIUM SULFATE 4 GM/100ML IV SOLN
4.0000 g | Freq: Once | INTRAVENOUS | Status: AC
  Administered 2018-06-20: 4 g via INTRAVENOUS
  Filled 2018-06-20: qty 100

## 2018-06-20 NOTE — Discharge Summary (Signed)
Physician Discharge Summary  Victor Clarke LFY:[id number] DOB: 23-Apr-1929 DOA: 06/18/2018  PCP: Golden Circle, FNP  Admit date: 06/18/2018 Discharge date: 06/20/2018  Admitted From: Home Disposition: Home  Recommendations for Outpatient Follow-up:  1. Follow up with PCP in 1-2 weeks 2. Please obtain BMP/CBC in one week 3. Please check your blood pressure every day and bring a log and your primary care doctor  Home Health: Yes, home health PT, social work, aide Equipment/Devices: None  Discharge Condition: Stable CODE STATUS: DNR Diet recommendation: Heart Healthy  Brief/Interim Summary:  #) Syncope: Patient was admitted with syncope in the setting of eating poorly and taking blood pressure medications.  He was given IV fluids.  He initially in the ED had a brief run of what appeared to be possible Mobitz 2 heart block however on monitoring on telemetry and discuss with cardiology this was found to be only PVCs with type I block.  An echo was unremarkable.  Patient's home antihypertensives were held and blood pressure was stable.  His syncope was attributed to likely dehydration and overly tight blood pressure control.  He was told to hold his home blood pressure medications keep a log of his blood pressure and follow-up with his primary care doctor.  #) Acute on chronic stage III kidney disease: Patient was admitted with AKI on CKD.  He was given IV fluids with resolution of his AKI.  #) Hypertension/hyperlipidemia: Patient's home amlodipine and benazepril were held and will be held on discharge.  He was continued on home aspirin, rosuvastatin, fenofibrate.  #) Alcohol abuse: Patient reports drinking excessively but denies any comp gated withdrawals.  He was maintained on CIWA protocol but did not require any PRN lorazepam.  He was given counseling.  Discharge Diagnoses:  Principal Problem:   Syncope Active Problems:   Hypertension   Dehydration   UTI (urinary tract infection)    Anemia   Alcohol dependence (HCC)   Acute renal failure superimposed on stage 3 chronic kidney disease (HCC)   Leukopenia   AV block, Mobitz II   First degree AV block   Hypotension due to hypovolemia    Discharge Instructions  Discharge Instructions    Call MD for:  difficulty breathing, headache or visual disturbances   Complete by:  As directed    Call MD for:  hives   Complete by:  As directed    Call MD for:  persistant nausea and vomiting   Complete by:  As directed    Call MD for:  redness, tenderness, or signs of infection (pain, swelling, redness, odor or green/yellow discharge around incision site)   Complete by:  As directed    Call MD for:  severe uncontrolled pain   Complete by:  As directed    Call MD for:  temperature >100.4   Complete by:  As directed    Diet - low sodium heart healthy   Complete by:  As directed    Discharge instructions   Complete by:  As directed    Please check your blood pressure every day and bring along inferior primary care doctor.  Please do not take your blood pressure medication anymore until you see your primary care doctor.   Increase activity slowly   Complete by:  As directed      Allergies as of 06/20/2018   No Known Allergies     Medication List    STOP taking these medications   amLODipine-benazepril 5-20 MG capsule Commonly known as:  LOTREL     TAKE these medications   acetaminophen 500 MG tablet Commonly known as:  TYLENOL Take 500 mg by mouth every 6 (six) hours as needed (for pain or headaches).   aspirin EC 81 MG tablet Take 81 mg by mouth daily.   Desoximetasone 0.25 % Liqd Apply 0.25 sprays topically 2 (two) times daily. For 2 Weeks   feeding supplement (ENSURE ENLIVE) Liqd Take 237 mLs by mouth 3 (three) times daily between meals. What changed:    when to take this  additional instructions   fenofibrate 145 MG tablet Commonly known as:  TRICOR Take 145 mg by mouth once a day   Fish Oil 1200  MG Caps Take 2,400 mg by mouth daily.   folic acid 631 MCG tablet Commonly known as:  FOLVITE Take 800 mcg by mouth daily.   ketoconazole 2 % shampoo Commonly known as:  NIZORAL Apply 1 application topically See admin instructions. Shampoo 2 times a week as needed for dry scalp   Magnesium Oxide 250 MG Tabs Take 500 mg by mouth daily.   multivitamin tablet Take 1 tablet by mouth daily.   rosuvastatin 20 MG tablet Commonly known as:  CRESTOR Take 20 mg by mouth once a day   triamcinolone cream 0.1 % Commonly known as:  KENALOG Apply to affected areas two times a day as needed for itching   Vitamin D3 5000 units Caps Take 10,000 Units by mouth daily.      Follow-up Information    Buford Dresser, MD Follow up on 06/30/2018.   Specialty:  Cardiology Why:  8:40 am for hospital follow up Contact information: [place] [address] [phone]          No Known Allergies  Consultations:  Cardiology   Procedures/Studies: Dg Chest 2 View  Result Date: 06/18/2018 CLINICAL DATA:  Syncopal episode in the kitchen. EXAM: CHEST - 2 VIEW COMPARISON:  04/30/2018 CXR and chest CT FINDINGS: Stable borderline cardiomegaly and aortic atherosclerosis. No acute pulmonary consolidation, effusion or edema. No acute osseous abnormality. Stable bilateral fine interstitial lung markings that may reflect chronic interstitial lung changes are redemonstrated. IMPRESSION: 1. Stable borderline cardiomegaly with aortic atherosclerosis. 2. Fine interstitial prominence of the lungs likely reflecting chronic interstitial change/fibrosis. 3. No active pulmonary disease. Electronically Signed   By: Ashley Royalty M.D.   On: 06/18/2018 18:12   Ct Head Wo Contrast  Result Date: 06/18/2018 CLINICAL DATA:  Syncopal episode today with hypotension. EXAM: CT HEAD WITHOUT CONTRAST TECHNIQUE: Contiguous axial images were obtained from the base of the skull through the vertex  without intravenous contrast. COMPARISON:  11/10/2015 FINDINGS: Brain: Moderate-to-marked sulcal and ventricular prominence consistent with cerebral atrophy is redemonstrated with moderate small vessel ischemic disease. No acute intracranial hemorrhage, mass, midline shift or edema. Midline fourth ventricle and basal cisterns without effacement. No extra-axial fluid collections. No acute intracranial hemorrhage. Vascular: Moderate atherosclerosis of the carotid siphons. Skull: Negative for acute fracture or suspicious osseous lesions. Sinuses/Orbits: No acute finding. Cataract surgical change on the left with glaucoma reservoir or scleral band on the right as before. Other: None IMPRESSION: Chronic stable cerebral atrophy and moderate small vessel ischemia. No acute intracranial abnormality. Electronically Signed   By: Ashley Royalty M.D.   On: 06/18/2018 18:01   US Renal  Result Date: 06/18/2018 CLINICAL DATA:  83 y/o  M; acute renal failure.  History of CKD 3. EXAM: RENAL / URINARY TRACT ULTRASOUND COMPLETE COMPARISON:  None. FINDINGS: Right Kidney: Length: 10.6 cm. Mild increased echogenicity and thinning of renal cortex. Lower pole simple appearing 1.1 cm cyst. Left Kidney: Length: 11.9 cm. Mildly increased echogenicity and thinning of renal cortex. Cortical thinning. Simple parapelvic cysts measuring up to 7.9 cm. Bladder: Appears normal for degree of bladder distention. Bilateral ureteral jets noted. IMPRESSION: Mildly increased echogenicity and thinning of the bilateral renal cortices compatible with medical renal disease. Bilateral simple cysts. No acute process identified. Electronically Signed   By: Kristine Garbe M.D.   On: 06/18/2018 22:04    Echo 06/2018:Left ventricle: The cavity size was normal. Systolic function was   normal. The estimated ejection fraction was in the range of 55%   to 60%. Wall motion was normal; there were no regional wall   motion abnormalities. There was an  increased relative   contribution of atrial contraction to ventricular filling.   Doppler parameters are consistent with abnormal left ventricular   relaxation (grade 1 diastolic dysfunction). - Aortic valve: Trileaflet; moderately thickened, moderately   calcified leaflets. Valve area (VTI): 2.42 cm^2. Valve area   (Vmean): 2.19 cm^2. - Mitral valve: Calcified annulus. There was trivial regurgitation. - Right ventricle: Systolic function was mildly to moderately   reduced. - Tricuspid valve: There was mild regurgitation. - Pulmonic valve: There was trivial regurgitation. - Pulmonary arteries: PA peak pressure: 35 mm Hg (S).   Subjective:   Discharge Exam: Vitals:   06/19/18 2222 06/20/18 0500  BP: 114/75 126/64  Pulse: 65 69  Resp:  18  Temp:  98.6 F (37 C)  SpO2:  99%   Vitals:   06/19/18 1403 06/19/18 1930 06/19/18 2222 06/20/18 0500  BP: 102/62 93/76 114/75 126/64  Pulse: 77 64 65 69  Resp: 18 18  18   Temp: 98.8 F (37.1 C) 98.3 F (36.8 C)  98.6 F (37 C)  TempSrc: Oral Oral  Oral  SpO2: 100% 99%  99%  Weight:    67.9 kg (149 lb 11.2 oz)  Height:        General exam: Appears calm and comfortable  Respiratory system: Clear to auscultation. Respiratory effort normal. Cardiovascular system: Irregularly regular rhythm, no murmurs Gastrointestinal system: Abdomen is nondistended, soft and nontender. No organomegaly or masses felt. Normal bowel sounds heard. Central nervous system: Alert and oriented. No focal neurological deficits. Extremities: Trace lower extremity edema Skin: No rashes over visible skin Psychiatry: Judgement and insight appear normal. Mood & affect appropriate.     The results of significant diagnostics from this hospitalization (including imaging, microbiology, ancillary and laboratory) are listed below for reference.     Microbiology: Recent Results (from the past 240 hour(s))  Urine culture     Status: Abnormal   Collection Time:  06/18/18  5:11 PM  Result Value Ref Range Status   Specimen Description URINE, RANDOM  Final   Special Requests   Final    NONE Performed at [facility], [address] [place] [address] [place], [place] [address]    Culture >=100,000 COLONIES/mL ESCHERICHIA COLI (A)  Final   Report Status 06/20/2018 FINAL  Final   Organism ID, Bacteria ESCHERICHIA COLI (A)  Final      Susceptibility   Escherichia coli - MIC*    AMPICILLIN 8 SENSITIVE Sensitive     CEFAZOLIN <=4 SENSITIVE Sensitive     CEFTRIAXONE <=1 SENSITIVE Sensitive     CIPROFLOXACIN <=0.25 SENSITIVE Sensitive     GENTAMICIN <=1 SENSITIVE Sensitive     IMIPENEM <=0.25  SENSITIVE Sensitive     NITROFURANTOIN <=16 SENSITIVE Sensitive     TRIMETH/SULFA <=20 SENSITIVE Sensitive     AMPICILLIN/SULBACTAM 4 SENSITIVE Sensitive     PIP/TAZO <=4 SENSITIVE Sensitive     Extended ESBL NEGATIVE Sensitive     * >=100,000 COLONIES/mL ESCHERICHIA COLI  MRSA PCR Screening     Status: None   Collection Time: 06/18/18  8:35 PM  Result Value Ref Range Status   MRSA by PCR NEGATIVE NEGATIVE Final    Comment:        The GeneXpert MRSA Assay (FDA approved for NASAL specimens only), is one component of a comprehensive MRSA colonization surveillance program. It is not intended to diagnose MRSA infection nor to guide or monitor treatment for MRSA infections. Performed at [facility], [place] [address] [place], [place] [address]      Labs: BNP (last 3 results) No results for input(s): BNP in the last 8760 hours. Basic Metabolic Panel: Recent Labs  Lab 06/18/18 1809 06/19/18 0338 06/20/18 0507  NA 135 138 140  K 4.6 4.1 3.9  CL 101 104 107  CO2 18* 24 24  GLUCOSE 113* 103* 96  BUN 59* 52* 31*  CREATININE 3.68* 2.82* 1.96*  CALCIUM 9.5 9.1 9.0  MG  --   --  1.4*   Liver Function Tests: Recent Labs  Lab 06/18/18 1809 06/19/18 0338  AST 49* 40  ALT 27 22  ALKPHOS 24* 27*  BILITOT 1.6* 1.0  PROT 6.0* 5.7*  ALBUMIN 3.4* 3.1*    No results for input(s): LIPASE, AMYLASE in the last 168 hours. No results for input(s): AMMONIA in the last 168 hours. CBC: Recent Labs  Lab 06/18/18 1809 06/19/18 0338 06/20/18 0507  WBC 3.2* 3.2* 2.4*  NEUTROABS 1.7 2.1  --   HGB 12.3* 12.4* 11.8*  HCT 36.8* 37.1* 35.8*  MCV 97.9 97.1 98.1  PLT 154 150 129*   Cardiac Enzymes: Recent Labs  Lab 06/18/18 1809  TROPONINI <0.03   BNP: Invalid input(s): POCBNP CBG: Recent Labs  Lab 06/18/18 1655 06/20/18 0526  GLUCAP 119* 85   D-Dimer No results for input(s): DDIMER in the last 72 hours. Hgb A1c No results for input(s): HGBA1C in the last 72 hours. Lipid Profile No results for input(s): CHOL, HDL, LDLCALC, TRIG, CHOLHDL, LDLDIRECT in the last 72 hours. Thyroid function studies No results for input(s): TSH, T4TOTAL, T3FREE, THYROIDAB in the last 72 hours.  Invalid input(s): FREET3 Anemia work up Recent Labs    06/19/18 0338  VITAMINB12 721   Urinalysis    Component Value Date/Time   COLORURINE YELLOW 06/18/2018 1827   APPEARANCEUR HAZY (A) 06/18/2018 1827   LABSPEC 1.014 06/18/2018 1827   PHURINE 5.0 06/18/2018 1827   GLUCOSEU 50 (A) 06/18/2018 1827   HGBUR MODERATE (A) 06/18/2018 1827   BILIRUBINUR NEGATIVE 06/18/2018 [place] 06/18/2018 1827   PROTEINUR 30 (A) 06/18/2018 1827   UROBILINOGEN 1.0 08/05/2015 1155   NITRITE NEGATIVE 06/18/2018 1827   LEUKOCYTESUR TRACE (A) 06/18/2018 1827   Sepsis Labs Invalid input(s): PROCALCITONIN,  WBC,  LACTICIDVEN Microbiology Recent Results (from the past 240 hour(s))  Urine culture     Status: Abnormal   Collection Time: 06/18/18  5:11 PM  Result Value Ref Range Status   Specimen Description URINE, RANDOM  Final   Special Requests   Final    NONE Performed at [facility], [place] [address], [place], [place] [address]  Culture >=100,000 COLONIES/mL ESCHERICHIA COLI (A)  Final   Report Status 06/20/2018 FINAL  Final   Organism ID,  Bacteria ESCHERICHIA COLI (A)  Final      Susceptibility   Escherichia coli - MIC*    AMPICILLIN 8 SENSITIVE Sensitive     CEFAZOLIN <=4 SENSITIVE Sensitive     CEFTRIAXONE <=1 SENSITIVE Sensitive     CIPROFLOXACIN <=0.25 SENSITIVE Sensitive     GENTAMICIN <=1 SENSITIVE Sensitive     IMIPENEM <=0.25 SENSITIVE Sensitive     NITROFURANTOIN <=16 SENSITIVE Sensitive     TRIMETH/SULFA <=20 SENSITIVE Sensitive     AMPICILLIN/SULBACTAM 4 SENSITIVE Sensitive     PIP/TAZO <=4 SENSITIVE Sensitive     Extended ESBL NEGATIVE Sensitive     * >=100,000 COLONIES/mL ESCHERICHIA COLI  MRSA PCR Screening     Status: None   Collection Time: 06/18/18  8:35 PM  Result Value Ref Range Status   MRSA by PCR NEGATIVE NEGATIVE Final    Comment:        The GeneXpert MRSA Assay (FDA approved for NASAL specimens only), is one component of a comprehensive MRSA colonization surveillance program. It is not intended to diagnose MRSA infection nor to guide or monitor treatment for MRSA infections. Performed at [facility], [place] [address] [place], [place] [address]      Time coordinating discharge: Over 30 minutes  SIGNED:   Cristy Folks, MD  [facility] 06/20/2018, 12:11 PM  If 7PM-7AM, please contact night-coverage [email or web address] Password [email or web address]

## 2018-06-20 NOTE — Evaluation (Signed)
Physical Therapy Evaluation Patient Details Name: Victor Clarke MRN: [id number] DOB: 1929/05/16 Today's Date: 06/20/2018   History of Present Illness  Pt is an 84 y/o male admitted secondary to weakness and syncope. Cardiology was consulted and pt found to have a first degree heart block. PMH including but not limited to Parkinson's, alcohol abuse, prostate cancer s/p TURP, COPD, gout, HTN and HLD.    Clinical Impression  Pt presented supine in bed with HOB elevated, awake and willing to participate in therapy session. Prior to admission, pt reported that he was independent with ADLs and used a SPC PRN when outside of his home. Pt lives alone and stated that he does not have anyone to assist him upon d/c. Pt currently able to perform bed mobility at modified independence, transfers with min guard and ambulated in hallway with min guard and RW. Pt with no reports of dizziness throughout. Pt would continue to benefit from skilled physical therapy services at this time while admitted and after d/c to address the below listed limitations in order to improve overall safety and independence with functional mobility.     Follow Up Recommendations Home health PT    Equipment Recommendations  None recommended by PT    Recommendations for Other Services       Precautions / Restrictions Precautions Precautions: Fall Restrictions Weight Bearing Restrictions: No      Mobility  Bed Mobility Overal bed mobility: Modified Independent             General bed mobility comments: increased time  Transfers Overall transfer level: Needs assistance Equipment used: Rolling walker (2 wheeled) Transfers: Sit to/from Stand Sit to Stand: Min guard         General transfer comment: min guard for safety; pt demonstrated good technique  Ambulation/Gait Ambulation/Gait assistance: Min guard Gait Distance (Feet): 100 Feet Assistive device: Rolling walker (2 wheeled) Gait Pattern/deviations:  Step-through pattern;Decreased stride length;Drifts right/left Gait velocity: decreased Gait velocity interpretation: 1.31 - 2.62 ft/sec, indicative of limited community ambulator General Gait Details: pt with mild instability but no overt LOB or need for physical assistance, min guard for safety  Stairs            Wheelchair Mobility    Modified Rankin (Stroke Patients Only)       Balance Overall balance assessment: Needs assistance Sitting-balance support: Feet supported Sitting balance-Leahy Scale: Good     Standing balance support: During functional activity;No upper extremity supported Standing balance-Leahy Scale: Fair                               Pertinent Vitals/Pain Pain Assessment: No/denies pain    Home Living Family/patient expects to be discharged to:: Private residence Living Arrangements: Alone Available Help at Discharge: Other (Comment)(pt reported "no one") Type of Home: House Home Access: Stairs to enter Entrance Stairs-Rails: None Entrance Stairs-Number of Steps: 2 Home Layout: One level Home Equipment: Walker - 2 wheels;Cane - single point      Prior Function Level of Independence: Independent with assistive device(s)         Comments: pt ambulated in his home without an AD and used a cane outside of his home; independent with ADLs     Hand Dominance        Extremity/Trunk Assessment   Upper Extremity Assessment Upper Extremity Assessment: Overall WFL for tasks assessed    Lower Extremity Assessment Lower Extremity Assessment: Overall WFL for  tasks assessed    Cervical / Trunk Assessment Cervical / Trunk Assessment: Kyphotic  Communication   Communication: No difficulties  Cognition Arousal/Alertness: Awake/alert Behavior During Therapy: WFL for tasks assessed/performed Overall Cognitive Status: Impaired/Different from baseline Area of Impairment: Problem solving;Safety/judgement                          Safety/Judgement: Decreased awareness of safety;Decreased awareness of deficits   Problem Solving: Requires verbal cues        General Comments      Exercises     Assessment/Plan    PT Assessment Patient needs continued PT services  PT Problem List Decreased balance;Decreased mobility;Decreased coordination;Decreased knowledge of use of DME;Decreased safety awareness;Decreased knowledge of precautions       PT Treatment Interventions DME instruction;Gait training;Stair training;Functional mobility training;Therapeutic activities;Therapeutic exercise;Balance training;Neuromuscular re-education;Patient/family education    PT Goals (Current goals can be found in the Care Plan section)  Acute Rehab PT Goals Patient Stated Goal: to go home today or tomorrow PT Goal Formulation: With patient Time For Goal Achievement: 07/04/18 Potential to Achieve Goals: Good    Frequency Min 3X/week   Barriers to discharge Decreased caregiver support      Co-evaluation               AM-PAC PT "6 Clicks" Daily Activity  Outcome Measure Difficulty turning over in bed (including adjusting bedclothes, sheets and blankets)?: None Difficulty moving from lying on back to sitting on the side of the bed? : None Difficulty sitting down on and standing up from a chair with arms (e.g., wheelchair, bedside commode, etc,.)?: Unable Help needed moving to and from a bed to chair (including a wheelchair)?: None Help needed walking in hospital room?: None Help needed climbing 3-5 steps with a railing? : A Little 6 Click Score: 20    End of Session Equipment Utilized During Treatment: Gait belt Activity Tolerance: Patient tolerated treatment well Patient left: in bed;with call bell/phone within reach;with bed alarm set;Other (comment)(sitting EOB to eat breakfast) Nurse Communication: Mobility status PT Visit Diagnosis: Other abnormalities of gait and mobility (R26.89)    Time:  0923-3007 PT Time Calculation (min) (ACUTE ONLY): 25 min   Charges:   PT Evaluation $PT Eval Moderate Complexity: 1 Mod PT Treatments $Gait Training: 8-22 mins        [place], [place], [place] 06/20/2018, 10:47 AM

## 2018-06-20 NOTE — Discharge Instructions (Signed)
Syncope Syncope is when you lose temporarily pass out (faint). Signs that you may be about to pass out include:  Feeling dizzy or light-headed.  Feeling sick to your stomach (nauseous).  Seeing all white or all black.  Having cold, clammy skin.  If you passed out, get help right away. Call your local emergency services (911 in the [place] Do not drive yourself to the hospital. Follow these instructions at home: Pay attention to any changes in your symptoms. Take these actions to help with your condition:  Have someone stay with you until you feel stable.  Do not drive, use machinery, or play sports until your doctor says it is okay.  Keep all follow-up visits as told by your doctor. This is important.  If you start to feel like you might pass out, lie down right away and raise (elevate) your feet above the level of your heart. Breathe deeply and steadily. Wait until all of the symptoms are gone.  Drink enough fluid to keep your pee (urine) clear or pale yellow.  If you are taking blood pressure or heart medicine, get up slowly and spend many minutes getting ready to sit and then stand. This can help with dizziness.  Take over-the-counter and prescription medicines only as told by your doctor.  Get help right away if:  You have a very bad headache.  You have unusual pain in your chest, tummy, or back.  You are bleeding from your mouth or rectum.  You have black or tarry poop (stool).  You have a very fast or uneven heartbeat (palpitations).  It hurts to breathe.  You pass out once or more than once.  You have jerky movements that you cannot control (seizure).  You are confused.  You have trouble walking.  You are very weak.  You have vision problems. These symptoms may be an emergency. Do not wait to see if the symptoms will go away. Get medical help right away. Call your local emergency services (911 in the [place] Do not drive yourself to the hospital. This  information is not intended to replace advice given to you by your health care provider. Make sure you discuss any questions you have with your health care provider. Document Released: 04/22/2008 Document Revised: 04/11/2016 Document Reviewed: 07/19/2015 Elsevier Interactive Patient Education  2018 [employer] Inc[employer]  

## 2018-06-20 NOTE — Progress Notes (Signed)
CSW was consulted for transportation needs for pt.  CSW spoke with pt.  Pt will paid for his transportation home by cab.  CSW spoke with CM Caryl Pina to speak with pt concerning assistance with [facility] services .   Reed Breech LCSWA [phone]

## 2018-06-20 NOTE — Care Management Note (Signed)
Case Management Note  Patient Details  Name: Victor Clarke MRN: [id number] Date of Birth: 11-22-1928  Subjective/Objective:           PT from home with no assistance from family/friends and ETOH abuse.  Pt agreeable to [facility] and states has used [facility] in the past and would like to use again.           Action/Plan: Referral called to [facility] with Alvis Lemmings.  Requested HH/F2F orders from MD for PT, Social work, Psychologist, counselling.     Expected Discharge Date:                  Expected Discharge Plan:  [place]  In-House Referral:  Clinical Social Work  Discharge planning Services  CM Consult  Post Acute Care Choice:  Home Health Choice offered to:  Patient  DME Arranged:  N/A DME Agency:  NA  HH Arranged:  PT, Social Work, Nurse's Aide [place] Agency:  [facility] [place]  Status of Service:  Completed, signed off  If discussed at [employer], dates discussed:    Additional Comments:  Claudie Leach, RN 06/20/2018, 11:32 AM

## 2018-06-22 ENCOUNTER — Telehealth: Payer: Self-pay | Admitting: Neurology

## 2018-06-22 LAB — FOLATE RBC: HEMATOCRIT: 35.2 % — AB (ref 37.5–51.0)

## 2018-06-22 NOTE — Telephone Encounter (Signed)
Shree called from [place] left a voicemail needing some information on this patient. Please call her at [phone].

## 2018-06-23 ENCOUNTER — Telehealth: Payer: Self-pay | Admitting: Neurology

## 2018-06-23 NOTE — Telephone Encounter (Signed)
Shreve from [facility] left a voicemail to provide [place] with information. She called him and left a voicemail about needing patient Victor Clarke PCP. That would be [name]. If you need to call Shreve back the number is [phone]. Thanks.

## 2018-06-23 NOTE — Telephone Encounter (Signed)
Left message for Shree to call me back.

## 2018-06-30 ENCOUNTER — Ambulatory Visit (INDEPENDENT_AMBULATORY_CARE_PROVIDER_SITE_OTHER): Payer: Medicare Other | Admitting: Cardiology

## 2018-06-30 ENCOUNTER — Encounter: Payer: Self-pay | Admitting: Cardiology

## 2018-06-30 VITALS — BP 132/82 | HR 83 | Ht 69.5 in | Wt 151.2 lb

## 2018-06-30 DIAGNOSIS — R55 Syncope and collapse: Secondary | ICD-10-CM | POA: Diagnosis not present

## 2018-06-30 DIAGNOSIS — E7800 Pure hypercholesterolemia, unspecified: Secondary | ICD-10-CM | POA: Diagnosis not present

## 2018-06-30 DIAGNOSIS — Z79899 Other long term (current) drug therapy: Secondary | ICD-10-CM

## 2018-06-30 DIAGNOSIS — I440 Atrioventricular block, first degree: Secondary | ICD-10-CM

## 2018-06-30 DIAGNOSIS — I10 Essential (primary) hypertension: Secondary | ICD-10-CM

## 2018-06-30 LAB — BASIC METABOLIC PANEL
BUN / CREAT RATIO: 11 (ref 10–24)
BUN: 19 mg/dL (ref 8–27)
CHLORIDE: 104 mmol/L (ref 96–106)
CO2: 21 mmol/L (ref 20–29)
Calcium: 9.7 mg/dL (ref 8.6–10.2)
Creatinine, Ser: 1.75 mg/dL — ABNORMAL HIGH (ref 0.76–1.27)
GFR calc non Af Amer: 34 mL/min/{1.73_m2} — ABNORMAL LOW (ref >59)
GFR, EST AFRICAN AMERICAN: 40 mL/min/{1.73_m2} — AB (ref >59)
Glucose: 79 mg/dL (ref 65–99)
POTASSIUM: 4.3 mmol/L (ref 3.5–5.2)
Sodium: 143 mmol/L (ref 134–144)

## 2018-06-30 LAB — LIPID PANEL
Chol/HDL Ratio: 3.7 ratio (ref 0.0–5.0)
Cholesterol, Total: 167 mg/dL (ref 100–199)
HDL: 45 mg/dL (ref >39)
LDL Calculated: 105 mg/dL — ABNORMAL HIGH (ref 0–99)
Triglycerides: 86 mg/dL (ref 0–149)
VLDL CHOLESTEROL CAL: 17 mg/dL (ref 5–40)

## 2018-06-30 NOTE — Patient Instructions (Signed)
Medication Instructions:   STOP aspirin  Labwork:  BMET, lipid today  Testing/Procedures:  Your physician has recommended that you wear an event monitor for 2 weeks. Event monitors are medical devices that record the heart's electrical activity. Doctors most often [place] these monitors to diagnose arrhythmias. Arrhythmias are problems with the speed or rhythm of the heartbeat. The monitor is a small, portable device. You can wear one while you do your normal daily activities. This is usually used to diagnose what is causing palpitations/syncope (passing out). -- done at [address] - [address]rd Floor  Follow-Up:  2-3 months with Dr. Harrell Gave  If you need a refill on your cardiac medications before your next appointment, please call your pharmacy.  Any Other Special Instructions Will Be Listed Below (If Applicable).

## 2018-06-30 NOTE — Progress Notes (Signed)
Cardiology Office Note:    Date:  06/30/2018   ID:  Victor Clarke, DOB 05-29-1929, MRN [id number]  PCP:  Sherald Hess., MD  Cardiologist:  Buford Dresser, MD PhD  Referring MD: Golden Circle, FNP   CC: post hospitalization follow up for syncope  History of Present Illness:    Victor Clarke is a 84 y.o. male with a hx of recent hospitalization for syncope who is seen as a new outpatient consult at the request of Victor Clarke for post-discharge follow up.  He was discharged on 06/20/18 after presenting on 06/18/18 with syncope. He endorsed poor oral intake and dehydration for several weeks prior. Concern in the ER was for abnormal ECG and possible arrhythmia as the inciting event for his syncope.   Today he reports that he still does not have much appetite, but he is trying to make himself eat and drink more. He uses ensure supplements as well. He has not had any additional syncope. He sometimes feels lightheaded, but this is less common than it was. Not clearly related to position or exertion.  Denies chest pain, shortness of breath, PND, orthopnea, LE edema, or palpitations.  He was taking aspirin prior to this visit. He endorses bright red blood per rectum. No falls. No stomach upset. He would like to know if he can cut back on any of his other medications. He has not taken antihypertensives since discharge. He is on fenofibrate and a statin currently. Reviewed with him again, no known history of CAD, CVA, or PAD.  Drinking 1-2 alcohol drinks/night.  Past Medical History:  Diagnosis Date  . Alcoholic peripheral neuropathy ([place])   . Allergy   . Arthritis   . Cataract    left eye cataract removed  . COPD (chronic obstructive pulmonary disease) ([place])    pt denies 02-03-17  . Detached retina    a. s/p surgical correction on the right.  . Ectopic atrial tachycardia ([place])    a. 08/2014 Echo: EF 55-60%, mildly dil LA.  Marland Kitchen Gout   . Hypercholesteremia   .  Hypertension   . Left inguinal hernia    a. s/p repair ~ 10 yrs ago.  . Parkinson's disease ([place])   . Prostate cancer ([place])    a. s/p TURP.    Past Surgical History:  Procedure Laterality Date  . COLONOSCOPY    . COLONOSCOPY W/ POLYPECTOMY  2001, 2006  . HERNIA REPAIR    . PROSTATE SURGERY      Current Medications: Current Outpatient Medications on File Prior to Visit  Medication Sig  . acetaminophen (TYLENOL) 500 MG tablet Take 500 mg by mouth every 6 (six) hours as needed (for pain or headaches).  . Cholecalciferol (VITAMIN D3) 5000 units CAPS Take 10,000 Units by mouth daily.  . feeding supplement, ENSURE ENLIVE, (ENSURE ENLIVE) LIQD Take 237 mLs by mouth 3 (three) times daily between meals. (Patient taking differently: Take 1 Bottle by mouth See admin instructions. Drink 1 bottle one to two times a day as needed for supplementation)  . fenofibrate (TRICOR) 145 MG tablet Take 145 mg by mouth once a day  . folic acid (FOLVITE) 878 MCG tablet Take 800 mcg by mouth daily.  Marland Kitchen ketoconazole (NIZORAL) 2 % shampoo Apply 1 application topically See admin instructions. Shampoo 2 times a week as needed for dry scalp  . Magnesium Oxide 250 MG TABS Take 500 mg by mouth daily.  . Multiple Vitamin (MULTIVITAMIN) tablet Take 1 tablet  by mouth daily.  . rosuvastatin (CRESTOR) 20 MG tablet Take 20 mg by mouth once a day  . triamcinolone cream (KENALOG) 0.1 % Apply to affected areas two times a day as needed for itching   No current facility-administered medications on file prior to visit.     Was on aspirin 81 mg as well, stopped this visit  Allergies:   Patient has no known allergies.   Social History   Socioeconomic History  . Marital status: Widowed    Spouse name: Not on file  . Number of children: 0  . Years of education: College  . Highest education level: Not on file  Occupational History  . Occupation: Retired    Fish farm manager: OTHER  Social Needs  . Financial resource strain: Not  on file  . Food insecurity:    Worry: Not on file    Inability: Not on file  . Transportation needs:    Medical: Not on file    Non-medical: Not on file  Tobacco Use  . Smoking status: Former Smoker    Packs/day: 2.00    Years: 22.00    Pack years: 44.00    Types: Cigarettes    Last attempt to quit: 11/18/1977    Years since quitting: 40.6  . Smokeless tobacco: Never Used  Substance and Sexual Activity  . Alcohol use: No    Alcohol/week: 2.0 standard drinks    Types: 1 Shots of liquor, 1 Standard drinks or equivalent per week    Comment: None currently since previous December.   . Drug use: No  . Sexual activity: Never  Lifestyle  . Physical activity:    Days per week: Not on file    Minutes per session: Not on file  . Stress: Not on file  Relationships  . Social connections:    Talks on phone: Not on file    Gets together: Not on file    Attends religious service: Not on file    Active member of club or organization: Not on file    Attends meetings of clubs or organizations: Not on file    Relationship status: Not on file  Other Topics Concern  . Not on file  Social History Narrative   Patient lives at an assisted living facility.  Has no children.     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     Education: college degree.   Caffeine Use: 1 cup daily     Family History: The patient's family history includes Heart attack in his brother and mother; Stroke in his brother and father. There is no history of Colon cancer, Esophageal cancer, Rectal cancer, Stomach cancer, or Prostate cancer.  ROS:   Please see the history of present illness.  Additional pertinent ROS: Review of Systems  Constitutional: Positive for malaise/fatigue and weight loss. Negative for chills and fever.  HENT: Positive for hearing loss. Negative for ear pain.   Eyes: Positive for blurred vision. Negative for pain.  Respiratory: Negative for hemoptysis, sputum production and  shortness of breath.   Cardiovascular: Negative for chest pain, palpitations, orthopnea, claudication, leg swelling and PND.  Gastrointestinal: Positive for blood in stool. Negative for abdominal pain.  Genitourinary: Positive for frequency and urgency. Negative for dysuria and hematuria.  Musculoskeletal: Negative for falls, joint pain and myalgias.  Skin: Negative for rash.  Neurological: Negative for sensory change, focal weakness and loss of consciousness.       No LOC since hospitalization  Endo/Heme/Allergies: Does not bruise/bleed easily.     EKGs/Labs/Other Studies Reviewed:    The following studies were reviewed today: Echo 06/19/18 Left ventricle: The cavity size was normal. Systolic function was   normal. The estimated ejection fraction was in the range of 55%   to 60%. Wall motion was normal; there were no regional wall   motion abnormalities. There was an increased relative   contribution of atrial contraction to ventricular filling.   Doppler parameters are consistent with abnormal left ventricular   relaxation (grade 1 diastolic dysfunction). - Aortic valve: Trileaflet; moderately thickened, moderately   calcified leaflets. Valve area (VTI): 2.42 cm^2. Valve area   (Vmean): 2.19 cm^2. - Mitral valve: Calcified annulus. There was trivial regurgitation. - Right ventricle: Systolic function was mildly to moderately   reduced. - Tricuspid valve: There was mild regurgitation. - Pulmonic valve: There was trivial regurgitation. - Pulmonary arteries: PA peak pressure: 35 mm Hg (S).  EKG:  EKG is ordered today.  The ekg ordered today demonstrates sinus rhythm, 1st degree AV block, PAC with aberrant conduction.  Recent Labs: 04/30/2018: TSH 1.458 06/19/2018: ALT 22 06/20/2018: BUN 31; Creatinine, Ser 1.96; Hemoglobin 11.8; Magnesium 1.4; Platelets 129; Potassium 3.9; Sodium 140  Recent Lipid Panel    Component Value Date/Time   CHOL 119 10/22/2016 1005   TRIG 142.0 10/22/2016  1005   HDL 29.40 (L) 10/22/2016 1005   CHOLHDL 4 10/22/2016 1005   VLDL 28.4 10/22/2016 1005   LDLCALC 62 10/22/2016 1005    Physical Exam:    VS:  BP 132/82   Pulse 83   Ht 5' 9.5" (1.765 m)   Wt 151 lb 3.2 oz (68.6 kg)   BMI 22.01 kg/m     Wt Readings from Last 3 Encounters:  06/30/18 151 lb 3.2 oz (68.6 kg)  06/20/18 149 lb 11.2 oz (67.9 kg)  04/30/18 160 lb (72.6 kg)     GEN: Frail appearing, in no acute distress HEENT: Normal NECK: No JVD; No carotid bruits LYMPHATICS: No lymphadenopathy CARDIAC: regular rhythm, normal S1 and S2, no murmurs, rubs, gallops. Radial and DP pulses 2+ bilaterally. RESPIRATORY:  Clear to auscultation without rales, wheezing or rhonchi  ABDOMEN: Soft, non-tender, non-distended MUSCULOSKELETAL:  No edema; No deformity  SKIN: Warm and dry NEUROLOGIC:  Alert and oriented x 3 PSYCHIATRIC:  Normal affect   ASSESSMENT:    1. Essential hypertension   2. Hypercholesterolemia   3. Syncope, unspecified syncope type   4. First degree AV block   5. Medication management    PLAN:    1. Syncope: no further issues since hospitalization. Has 1st degree AV block, had no pauses or issues on telemetry while hospitalized -he was significantly orthostatic and had an AKI on presentation to the ER; both resolved with fluids. Main concern is for his oral intake--lives alone, poor appetite. Worried about him keeping his nutrition up. He does not want help with this currently, but would encourage him to reconsider in the future. -concern that he still has occasional lightheadedness. Will get 2 week monitor to make sure he isn't having any arrhythmias at home.  2. Cardiovascular conditions: -Hypertension: normotensive off of medications, was profoundly hypotensive on ER presentation. Would not restart. -Hyperlipidemia: rechecking today. Given his age and no diagnosed CAD/CVA/PAD, would try to optimize and simplify his regimen. Would stop fenofibrate first. If  renal function poor after recheck today, would consider stopping rosuvastatin -Primary prevention: endorses hematochezia. Given that his aspirin is only for primary  prevention, stopped this today -First degree AV block: no treatment needed  Plan for follow up: 2-3 months or sooner PRN  Medication Adjustments/Labs and Tests Ordered: Current medicines are reviewed at length with the patient today.  Concerns regarding medicines are outlined above.  Orders Placed This Encounter  Procedures  . Basic metabolic panel  . Lipid panel  . CARDIAC EVENT MONITOR  . EKG 12-Lead   No orders of the defined types were placed in this encounter.   Patient Instructions  Medication Instructions:   STOP aspirin  Labwork:  BMET, lipid today  Testing/Procedures:  Your physician has recommended that you wear an event monitor for 2 weeks. Event monitors are medical devices that record the heart's electrical activity. Doctors most often [place] these monitors to diagnose arrhythmias. Arrhythmias are problems with the speed or rhythm of the heartbeat. The monitor is a small, portable device. You can wear one while you do your normal daily activities. This is usually used to diagnose what is causing palpitations/syncope (passing out). -- done at [address] - [address]  Follow-Up:  2-3 months with Dr. Harrell Gave  If you need a refill on your cardiac medications before your next appointment, please call your pharmacy.  Any Other Special Instructions Will Be Listed Below (If Applicable).       Signed, Buford Dresser, MD PhD 06/30/2018 11:30 AM    [place]

## 2018-07-02 ENCOUNTER — Other Ambulatory Visit: Payer: Self-pay

## 2018-07-03 ENCOUNTER — Telehealth: Payer: Self-pay | Admitting: Neurology

## 2018-07-03 NOTE — Telephone Encounter (Signed)
Debbie called from [place] and wanted to speak with the nurse regarding some safety concerns and no family support for this patient. She also wanted to discuss additional Social Work visit. Please call her back at [phone]. Thanks.

## 2018-07-07 NOTE — Telephone Encounter (Signed)
Called Debbie back and left message for her to call me if she still needs my help with patient.

## 2018-07-13 ENCOUNTER — Telehealth: Payer: Self-pay | Admitting: Cardiology

## 2018-07-13 NOTE — Telephone Encounter (Signed)
FYI Patient was a no showed for his appt on 07/03/2018 to get event monitor, I called today to reschedule and patient advised he did not want to reschedule

## 2018-08-18 ENCOUNTER — Emergency Department (HOSPITAL_COMMUNITY)
Admission: EM | Admit: 2018-08-18 | Discharge: 2018-08-18 | Disposition: A | Discharge status: Home / Self Care | Payer: Medicare Other | Attending: Emergency Medicine | Admitting: Emergency Medicine

## 2018-08-18 ENCOUNTER — Other Ambulatory Visit: Payer: Self-pay

## 2018-08-18 ENCOUNTER — Encounter (HOSPITAL_COMMUNITY): Payer: Self-pay | Admitting: Emergency Medicine

## 2018-08-18 DIAGNOSIS — Z79899 Other long term (current) drug therapy: Secondary | ICD-10-CM | POA: Insufficient documentation

## 2018-08-18 DIAGNOSIS — I129 Hypertensive chronic kidney disease with stage 1 through stage 4 chronic kidney disease, or unspecified chronic kidney disease: Secondary | ICD-10-CM | POA: Insufficient documentation

## 2018-08-18 DIAGNOSIS — Z87891 Personal history of nicotine dependence: Secondary | ICD-10-CM | POA: Insufficient documentation

## 2018-08-18 DIAGNOSIS — R55 Syncope and collapse: Secondary | ICD-10-CM | POA: Diagnosis present

## 2018-08-18 DIAGNOSIS — G20 Parkinson's disease: Secondary | ICD-10-CM | POA: Diagnosis not present

## 2018-08-18 DIAGNOSIS — Z8546 Personal history of malignant neoplasm of prostate: Secondary | ICD-10-CM | POA: Insufficient documentation

## 2018-08-18 DIAGNOSIS — R531 Weakness: Secondary | ICD-10-CM | POA: Insufficient documentation

## 2018-08-18 DIAGNOSIS — J449 Chronic obstructive pulmonary disease, unspecified: Secondary | ICD-10-CM | POA: Insufficient documentation

## 2018-08-18 DIAGNOSIS — N183 Chronic kidney disease, stage 3 (moderate): Secondary | ICD-10-CM | POA: Insufficient documentation

## 2018-08-18 LAB — BASIC METABOLIC PANEL
Anion gap: 15 (ref 5–15)
BUN: 32 mg/dL — ABNORMAL HIGH (ref 8–23)
CALCIUM: 9.3 mg/dL (ref 8.9–10.3)
CO2: 20 mmol/L — AB (ref 22–32)
CREATININE: 1.41 mg/dL — AB (ref 0.61–1.24)
Chloride: 103 mmol/L (ref 98–111)
GFR calc non Af Amer: 43 mL/min — ABNORMAL LOW (ref >60)
GFR, EST AFRICAN AMERICAN: 50 mL/min — AB (ref >60)
Glucose, Bld: 104 mg/dL — ABNORMAL HIGH (ref 70–99)
Potassium: 3.9 mmol/L (ref 3.5–5.1)
Sodium: 138 mmol/L (ref 135–145)

## 2018-08-18 LAB — CBC
HCT: 35.7 % — ABNORMAL LOW (ref 39.0–52.0)
Hemoglobin: 12.1 g/dL — ABNORMAL LOW (ref 13.0–17.0)
MCH: 33.7 pg (ref 26.0–34.0)
MCHC: 33.9 g/dL (ref 30.0–36.0)
MCV: 99.4 fL (ref 78.0–100.0)
PLATELETS: 143 10*3/uL — AB (ref 150–400)
RBC: 3.59 MIL/uL — AB (ref 4.22–5.81)
RDW: 13.7 % (ref 11.5–15.5)
WBC: 4.4 10*3/uL (ref 4.0–10.5)

## 2018-08-18 LAB — TROPONIN I: Troponin I: <0.03 ng/mL (ref <0.03)

## 2018-08-18 MED ORDER — SODIUM CHLORIDE 0.9 % IV BOLUS
500.0000 mL | Freq: Once | INTRAVENOUS | Status: AC
  Administered 2018-08-18: 500 mL via INTRAVENOUS

## 2018-08-18 NOTE — ED Notes (Signed)
Patient verbalizes understanding of discharge instructions. Opportunity for questioning and answers were provided. Armband removed by staff, pt discharged from ED in wheelchair.  

## 2018-08-18 NOTE — ED Provider Notes (Signed)
[facility] EMERGENCY DEPARTMENT Provider Note   CSN: [id number] Arrival date & time: 08/18/18  1954     History   Chief Complaint Chief Complaint  Patient presents with  . Fall  . Weakness    HPI Victor Clarke is a 84 y.o. male.  HPI The patient presents with near syncope.  States he was at home and felt generally weak and had a near syncopal episode.  States he was standing with his walker.  No injury.  States he has had an episode like this before.  States he has had 2-3 drinks today.  States it is not unusual for him.  No chest pain.  No trouble breathing.  States he does not have much of an appetite overall. Past Medical History:  Diagnosis Date  . Alcoholic peripheral neuropathy ([place])   . Allergy   . Arthritis   . Cataract    left eye cataract removed  . COPD (chronic obstructive pulmonary disease) ([place])    pt denies 02-03-17  . Detached retina    a. s/p surgical correction on the right.  . Ectopic atrial tachycardia ([place])    a. 08/2014 Echo: EF 55-60%, mildly dil LA.  Marland Kitchen Gout   . Hypercholesteremia   . Hypertension   . Left inguinal hernia    a. s/p repair ~ 10 yrs ago.  . Parkinson's disease ([place])   . Prostate cancer ([place])    a. s/p TURP.    Patient Active Problem List   Diagnosis Date Noted  . First degree AV block   . Hypotension due to hypovolemia   . Syncope 06/18/2018  . Acute renal failure superimposed on stage 3 chronic kidney disease ([place]) 06/18/2018  . Leukopenia 06/18/2018  . AV block, [place] II 06/18/2018  . Memory loss 10/22/2016  . Malnutrition of moderate degree 11/15/2015  . Fall from steps 11/07/2015  . Anemia 11/07/2015  . Alcohol dependence ([place]) 11/07/2015  . Pyuria 11/07/2015  . Dehydration 08/08/2015  . UTI (urinary tract infection) 08/08/2015  . High anion gap metabolic acidosis 19/62/2297  . Dizziness 07/31/2015  . Orthostatic hypotension 07/31/2015  . Lactic acidosis 07/31/2015  . Ectopic atrial  tachycardia ([place])   . Hypercholesteremia   . Dyspnea on exertion 09/16/2014  . Hypertension 09/16/2014    Past Surgical History:  Procedure Laterality Date  . COLONOSCOPY    . COLONOSCOPY W/ POLYPECTOMY  2001, 2006  . HERNIA REPAIR    . PROSTATE SURGERY          Home Medications    Prior to Admission medications   Medication Sig Start Date End Date Taking? Authorizing Provider  acetaminophen (TYLENOL) 500 MG tablet Take 500 mg by mouth every 6 (six) hours as needed (for pain or headaches).    [provider]  Cholecalciferol (VITAMIN D3) 5000 units CAPS Take 10,000 Units by mouth daily.    [provider]  feeding supplement, ENSURE ENLIVE, (ENSURE ENLIVE) LIQD Take 237 mLs by mouth 3 (three) times daily between meals. Patient taking differently: Take 1 Bottle by mouth See admin instructions. Drink 1 bottle one to two times a day as needed for supplementation 08/08/15   Kelvin Cellar, MD  folic acid (FOLVITE) 989 MCG tablet Take 800 mcg by mouth daily.    [provider]  ketoconazole (NIZORAL) 2 % shampoo Apply 1 application topically See admin instructions. Shampoo 2 times a week as needed for dry scalp    [provider]  Magnesium Oxide 250 MG TABS Take 500 mg by mouth daily.    [provider]  Multiple Vitamin (MULTIVITAMIN) tablet Take 1 tablet by mouth daily.    [provider]  rosuvastatin (CRESTOR) 20 MG tablet Take 20 mg by mouth once a day 03/17/18   [provider]  triamcinolone cream (KENALOG) 0.1 % Apply to affected areas two times a day as needed for itching 04/23/18   [provider]    Family History Family History  Problem Relation Age of Onset  . Stroke Father        died @ 109.  Marland Kitchen Heart attack Mother        died @ 66  . Stroke Brother        died in his 63's  . Heart attack Brother        died in his 53's  . Colon cancer Neg Hx   . Esophageal cancer Neg Hx   . Rectal cancer Neg  Hx   . Stomach cancer Neg Hx   . Prostate cancer Neg Hx     Social History Social History   Tobacco Use  . Smoking status: Former Smoker    Packs/day: 2.00    Years: 22.00    Pack years: 44.00    Types: Cigarettes    Last attempt to quit: 11/18/1977    Years since quitting: 40.7  . Smokeless tobacco: Never Used  Substance Use Topics  . Alcohol use: No    Alcohol/week: 2.0 standard drinks    Types: 1 Shots of liquor, 1 Standard drinks or equivalent per week    Comment: None currently since previous December.   . Drug use: No     Allergies   Patient has no known allergies.   Review of Systems Review of Systems  Constitutional: Positive for appetite change. Negative for fatigue and fever.  Respiratory: Negative for shortness of breath.   Cardiovascular: Negative for chest pain.  Gastrointestinal: Negative for abdominal pain.  Genitourinary: Negative for flank pain.  Musculoskeletal: Negative for back pain.  Skin: Negative for rash.  Neurological: Positive for light-headedness.  Psychiatric/Behavioral: Negative for confusion.     Physical Exam Updated Vital Signs BP (!) 138/93   Pulse 81   Resp 17   SpO2 100%   Physical Exam  Constitutional: He appears well-developed.  Patient does smell like alcohol.  HENT:  Head: Atraumatic.  Neck: Neck supple.  Cardiovascular: Normal rate.  Pulmonary/Chest: Effort normal.  Abdominal: There is no tenderness.  Musculoskeletal: He exhibits no edema.  Neurological: He is alert.     ED Treatments / Results  Labs (all labs ordered are listed, but only abnormal results are displayed) Labs Reviewed  BASIC METABOLIC PANEL - Abnormal; Notable for the following components:      Result Value   CO2 20 (*)    Glucose, Bld 104 (*)    BUN 32 (*)    Creatinine, Ser 1.41 (*)    GFR calc non Af Amer 43 (*)    GFR calc Af Amer 50 (*)    All other components within normal limits  CBC - Abnormal; Notable for the following  components:   RBC 3.59 (*)    Hemoglobin 12.1 (*)    HCT 35.7 (*)    Platelets 143 (*)    All other components within normal limits  TROPONIN I  URINALYSIS, ROUTINE W REFLEX MICROSCOPIC    EKG EKG Interpretation  Date/Time:  Tuesday August 18 2018 20:11:53 EDT Ventricular Rate:  83 PR Interval:    QRS Duration: 103 QT Interval:  377 QTC Calculation: 443 R Axis:   -63 Text Interpretation:  Sinus rhythm Ventricular premature complex Prolonged PR interval Incomplete RBBB and LAFB Abnormal R-wave progression, late transition Confirmed by Davonna Belling [id number]) on 08/18/2018 8:21:06 PM   Radiology No results found.  Procedures Procedures (including critical care time)  Medications Ordered in ED Medications  sodium chloride 0.9 % bolus 500 mL (500 mLs Intravenous New Bag/Given 08/18/18 2104)     Initial Impression / Assessment and Plan / ED Course  I have reviewed the triage vital signs and the nursing notes.  Pertinent labs & imaging results that were available during my care of the patient were reviewed by me and considered in my medical decision making (see chart for details).     Patient with near syncopal episode.  Has been drinking alcohol.  Work-up so far reassuring.  However patient does not want to stay for his urine or further work-up.  Reviewed records and has had previous syncopal episodes.  Thought to be a component of dehydration.  Creatinine better than it had been prior.  Discharged home but it is somewhat before the complete work-up.  Patient appears able to make this decision for  Final Clinical Impressions(s) / ED Diagnoses   Final diagnoses:  Near syncope    ED Discharge Orders    None       Davonna Belling, MD 08/18/18 2203

## 2018-08-18 NOTE — Progress Notes (Signed)
CSW spoke with pt. Pt agreeable to returning home. Pt expressed concerns about one of his phones not working earlier today. CSW and pt discussed "Life Alert." Pt agreeable to applying for a life alert. CM consult placed and home health orders placed. Pt stated he had home health in the past, but unable to recall which agency. CSW will leave handoff for daytime CM to follow up about home health needs.   Pt provided CSW with home address. CSW gave pt taxi voucher to return home.   Wendelyn Breslow, [facility]  [phone]

## 2018-08-18 NOTE — ED Triage Notes (Signed)
Pt denies hitting head and LOC.

## 2018-08-18 NOTE — ED Triage Notes (Signed)
BIB [facility] from home with c/o of generalized weakness followed by a fall. Pt denies pain/injury just states he's feeling weak. Per EMS ETOH onboard.

## 2018-08-19 ENCOUNTER — Encounter (HOSPITAL_COMMUNITY): Payer: Self-pay | Admitting: Obstetrics and Gynecology

## 2018-08-19 ENCOUNTER — Other Ambulatory Visit: Payer: Self-pay

## 2018-08-19 ENCOUNTER — Emergency Department (HOSPITAL_COMMUNITY)
Admission: EM | Admit: 2018-08-19 | Discharge: 2018-08-19 | Disposition: A | Discharge status: Home / Self Care | Payer: Medicare Other | Attending: Emergency Medicine | Admitting: Emergency Medicine

## 2018-08-19 DIAGNOSIS — Z87891 Personal history of nicotine dependence: Secondary | ICD-10-CM | POA: Diagnosis not present

## 2018-08-19 DIAGNOSIS — R531 Weakness: Secondary | ICD-10-CM | POA: Diagnosis not present

## 2018-08-19 DIAGNOSIS — Z8546 Personal history of malignant neoplasm of prostate: Secondary | ICD-10-CM | POA: Insufficient documentation

## 2018-08-19 DIAGNOSIS — I10 Essential (primary) hypertension: Secondary | ICD-10-CM | POA: Insufficient documentation

## 2018-08-19 DIAGNOSIS — R42 Dizziness and giddiness: Secondary | ICD-10-CM | POA: Diagnosis present

## 2018-08-19 DIAGNOSIS — F10129 Alcohol abuse with intoxication, unspecified: Secondary | ICD-10-CM | POA: Insufficient documentation

## 2018-08-19 DIAGNOSIS — G20 Parkinson's disease: Secondary | ICD-10-CM | POA: Insufficient documentation

## 2018-08-19 DIAGNOSIS — F10920 Alcohol use, unspecified with intoxication, uncomplicated: Secondary | ICD-10-CM

## 2018-08-19 DIAGNOSIS — Z79899 Other long term (current) drug therapy: Secondary | ICD-10-CM | POA: Insufficient documentation

## 2018-08-19 LAB — BASIC METABOLIC PANEL
ANION GAP: 15 (ref 5–15)
BUN: 35 mg/dL — ABNORMAL HIGH (ref 8–23)
CHLORIDE: 105 mmol/L (ref 98–111)
CO2: 20 mmol/L — ABNORMAL LOW (ref 22–32)
Calcium: 9.1 mg/dL (ref 8.9–10.3)
Creatinine, Ser: 1.45 mg/dL — ABNORMAL HIGH (ref 0.61–1.24)
GFR, EST AFRICAN AMERICAN: 48 mL/min — AB (ref >60)
GFR, EST NON AFRICAN AMERICAN: 42 mL/min — AB (ref >60)
Glucose, Bld: 91 mg/dL (ref 70–99)
POTASSIUM: 4.1 mmol/L (ref 3.5–5.1)
Sodium: 140 mmol/L (ref 135–145)

## 2018-08-19 LAB — CBC WITH DIFFERENTIAL/PLATELET
BASOS ABS: 0 10*3/uL (ref 0.0–0.1)
Basophils Relative: 0 %
Eosinophils Absolute: 0.1 10*3/uL (ref 0.0–0.7)
Eosinophils Relative: 2 %
HCT: 35.2 % — ABNORMAL LOW (ref 39.0–52.0)
HEMOGLOBIN: 12.1 g/dL — AB (ref 13.0–17.0)
LYMPHS ABS: 0.4 10*3/uL — AB (ref 0.7–4.0)
LYMPHS PCT: 9 %
MCH: 33.6 pg (ref 26.0–34.0)
MCHC: 34.4 g/dL (ref 30.0–36.0)
MCV: 97.8 fL (ref 78.0–100.0)
Monocytes Absolute: 0.7 10*3/uL (ref 0.1–1.0)
Monocytes Relative: 14 %
NEUTROS PCT: 75 %
Neutro Abs: 3.5 10*3/uL (ref 1.7–7.7)
PLATELETS: 153 10*3/uL (ref 150–400)
RBC: 3.6 MIL/uL — AB (ref 4.22–5.81)
RDW: 14.2 % (ref 11.5–15.5)
WBC: 4.6 10*3/uL (ref 4.0–10.5)

## 2018-08-19 LAB — ETHANOL: Alcohol, Ethyl (B): 145 mg/dL — ABNORMAL HIGH (ref <10)

## 2018-08-19 LAB — CBG MONITORING, ED: Glucose-Capillary: 88 mg/dL (ref 70–99)

## 2018-08-19 LAB — I-STAT TROPONIN, ED: Troponin i, poc: 0.04 ng/mL (ref 0.00–0.08)

## 2018-08-19 MED ORDER — SODIUM CHLORIDE 0.9 % IV BOLUS
1000.0000 mL | Freq: Once | INTRAVENOUS | Status: AC
  Administered 2018-08-19: 1000 mL via INTRAVENOUS

## 2018-08-19 NOTE — ED Notes (Signed)
Pt stated to RN "I want to leave right now" RN removed pt's IV and reviewed d/c paperwork with pt. Pt reported he was unhappy with his care and did not care for the way Melina Copa, MD told him about his alcohol level. Pt assisted to get dressed, assisted into a wheelchair, and given all his belongings. Pt asked RN to call a cab for him, Secretary of ED called pt a cab.

## 2018-08-19 NOTE — ED Notes (Signed)
RN asked pt what he hoped to gain from his care today. Pt reported he wanted "to get back to a sane piece of mind" Pt reports he wants to "not be dizzy anymore" as this has been going on for "2 months"

## 2018-08-19 NOTE — ED Notes (Signed)
Pt given urinal and made aware of need for urine specimen 

## 2018-08-19 NOTE — Discharge Instructions (Addendum)
You were evaluated in the emergency department for an episode of weakness.  You had blood work and an EKG that did not show an obvious cause of your symptoms.  Your alcohol level was elevated here and this may be part of why you did not feel well today.  Will be important for you to follow-up with your doctor for further evaluation.  Please return if any concerns.

## 2018-08-19 NOTE — ED Triage Notes (Signed)
Pt is coming in for weakness and reports that he was here yesterday for the same. Pt reports his main reason for calling was that he feels unsafe at home and he lives alone. Pt reports he was at an assisted living facility, but then left and went home because he felt like it was safe to.

## 2018-08-19 NOTE — ED Notes (Signed)
MD made aware of pt request to leave.

## 2018-08-19 NOTE — ED Notes (Signed)
Asked pt about urine specimen and pt began to yell at this writer. Pt states no one has been in in over an hour to check on pt. I explained to the pt I was in his room less than 45 mins ago and that we have someone at the desk watching his monitor. Explained to pt that we were waiting on lab results and that we needed the urine to complete the lab results and then the MD would be in to talk to him. Pt was not happy with this explanation and stated he wanted to go home. I will relay that to the RN.

## 2018-08-19 NOTE — ED Provider Notes (Signed)
[facility] DEPT Provider Note   CSN: [id number] Arrival date & time: 08/19/18  1629     History   Chief Complaint Chief Complaint  Patient presents with  . Weakness    HPI Victor Clarke is a 84 y.o. male.  He presents to the hospital via ambulance after feeling acutely dizzy at home.  He was here yesterday for same.  He states he was worried that he might pass out and he was alone and so he called the ambulance.  He says this started a couple of months ago when he was admitted to the hospital for a few days.  It does not sound like they were able to find a cause of his symptoms.  He currently lives alone.  He denies any chest pain or shortness of breath fever cough.  The history is provided by the patient.  Weakness  Primary symptoms include dizziness.  Primary symptoms include no focal weakness, no visual change. This is a recurrent problem. The current episode started 3 to 5 hours ago. There was no focality noted. There has been no fever. Pertinent negatives include no shortness of breath, no chest pain, no vomiting and no headaches. There were no medications administered prior to arrival.    Past Medical History:  Diagnosis Date  . Alcoholic peripheral neuropathy ([place])   . Allergy   . Arthritis   . Cataract    left eye cataract removed  . COPD (chronic obstructive pulmonary disease) ([place])    pt denies 02-03-17  . Detached retina    a. s/p surgical correction on the right.  . Ectopic atrial tachycardia ([place])    a. 08/2014 Echo: EF 55-60%, mildly dil LA.  Marland Kitchen Gout   . Hypercholesteremia   . Hypertension   . Left inguinal hernia    a. s/p repair ~ 10 yrs ago.  . Parkinson's disease ([place])   . Prostate cancer ([place])    a. s/p TURP.    Patient Active Problem List   Diagnosis Date Noted  . First degree AV block   . Hypotension due to hypovolemia   . Syncope 06/18/2018  . Acute renal failure superimposed on stage 3 chronic kidney disease  ([place]) 06/18/2018  . Leukopenia 06/18/2018  . AV block, Mobitz II 06/18/2018  . Memory loss 10/22/2016  . Malnutrition of moderate degree 11/15/2015  . Fall from steps 11/07/2015  . Anemia 11/07/2015  . Alcohol dependence ([place]) 11/07/2015  . Pyuria 11/07/2015  . Dehydration 08/08/2015  . UTI (urinary tract infection) 08/08/2015  . High anion gap metabolic acidosis 42/35/3614  . Dizziness 07/31/2015  . Orthostatic hypotension 07/31/2015  . Lactic acidosis 07/31/2015  . Ectopic atrial tachycardia ([place])   . Hypercholesteremia   . Dyspnea on exertion 09/16/2014  . Hypertension 09/16/2014    Past Surgical History:  Procedure Laterality Date  . COLONOSCOPY    . COLONOSCOPY W/ POLYPECTOMY  2001, 2006  . HERNIA REPAIR    . PROSTATE SURGERY          Home Medications    Prior to Admission medications   Medication Sig Start Date End Date Taking? Authorizing Provider  aspirin EC 81 MG tablet Take 81 mg by mouth daily.   Yes [provider]  Cholecalciferol (VITAMIN D3) 5000 units CAPS Take 10,000 Units by mouth daily.   Yes [provider]  Desoximetasone 0.25 % LIQD  07/13/18  Yes [provider]  folic acid (FOLVITE) 431 MCG tablet Take  800 mcg by mouth daily.   Yes [provider]  Magnesium Oxide 250 MG TABS Take 500 mg by mouth daily.   Yes [provider]  Multiple Vitamin (MULTIVITAMIN) tablet Take 1 tablet by mouth daily.   Yes [provider]  rosuvastatin (CRESTOR) 20 MG tablet Take 20 mg by mouth daily.  03/17/18  Yes [provider]  feeding supplement, ENSURE ENLIVE, (ENSURE ENLIVE) LIQD Take 237 mLs by mouth 3 (three) times daily between meals. Patient not taking: Reported on 08/19/2018 08/08/15   Kelvin Cellar, MD    Family History Family History  Problem Relation Age of Onset  . Stroke Father        died @ 37.  Marland Kitchen Heart attack Mother        died @ 62  . Stroke Brother        died in his 55's  .  Heart attack Brother        died in his 80's  . Colon cancer Neg Hx   . Esophageal cancer Neg Hx   . Rectal cancer Neg Hx   . Stomach cancer Neg Hx   . Prostate cancer Neg Hx     Social History Social History   Tobacco Use  . Smoking status: Former Smoker    Packs/day: 2.00    Years: 22.00    Pack years: 44.00    Types: Cigarettes    Last attempt to quit: 11/18/1977    Years since quitting: 40.7  . Smokeless tobacco: Never Used  Substance Use Topics  . Alcohol use: No    Alcohol/week: 2.0 standard drinks    Types: 1 Shots of liquor, 1 Standard drinks or equivalent per week    Comment: None currently since previous December.   . Drug use: No     Allergies   Patient has no known allergies.   Review of Systems Review of Systems  Constitutional: Negative for fever.  HENT: Negative for sore throat.   Eyes: Negative for visual disturbance.  Respiratory: Negative for shortness of breath.   Cardiovascular: Negative for chest pain.  Gastrointestinal: Negative for abdominal pain and vomiting.  Genitourinary: Negative for dysuria.  Musculoskeletal: Negative for neck pain.  Skin: Negative for rash.  Neurological: Positive for dizziness and weakness. Negative for focal weakness and headaches.     Physical Exam Updated Vital Signs BP 123/75   Pulse 79   Temp 98.6 F (37 C) (Oral)   Resp 16   SpO2 98%   Physical Exam  Constitutional: He appears well-developed and well-nourished.  HENT:  Head: Normocephalic and atraumatic.  Eyes: Conjunctivae are normal.  Neck: Neck supple.  Cardiovascular: Normal rate, regular rhythm and normal heart sounds.  No murmur heard. Pulmonary/Chest: Effort normal and breath sounds normal. No respiratory distress.  Abdominal: Soft. He exhibits no mass. There is no tenderness. There is no guarding.  Musculoskeletal: Normal range of motion. He exhibits no edema, tenderness or deformity.  Neurological: He is alert. He has normal strength. No  sensory deficit. GCS eye subscore is 4. GCS verbal subscore is 5. GCS motor subscore is 6.  Skin: Skin is warm and dry. Capillary refill takes less than 2 seconds.  Nursing note and vitals reviewed.    ED Treatments / Results  Labs (all labs ordered are listed, but only abnormal results are displayed) Labs Reviewed  ETHANOL - Abnormal; Notable for the following components:      Result Value   Alcohol, Ethyl (B)  145 (*)    All other components within normal limits  BASIC METABOLIC PANEL - Abnormal; Notable for the following components:   CO2 20 (*)    BUN 35 (*)    Creatinine, Ser 1.45 (*)    GFR calc non Af Amer 42 (*)    GFR calc Af Amer 48 (*)    All other components within normal limits  CBC WITH DIFFERENTIAL/PLATELET - Abnormal; Notable for the following components:   RBC 3.60 (*)    Hemoglobin 12.1 (*)    HCT 35.2 (*)    Lymphs Abs 0.4 (*)    All other components within normal limits  CBG MONITORING, ED  I-STAT TROPONIN, ED    EKG EKG Interpretation  Date/Time:  Wednesday August 19 2018 17:53:36 EDT Ventricular Rate:  86 PR Interval:    QRS Duration: 103 QT Interval:  377 QTC Calculation: 451 R Axis:   -65 Text Interpretation:  Sinus or ectopic atrial rhythm Prolonged PR interval Incomplete RBBB and LAFB Abnormal R-wave progression, late transition Baseline wander in lead(s) V3 similar to yesterday 10/19 Confirmed by Aletta Edouard [phone]) on 08/19/2018 6:18:54 PM Also confirmed by Aletta Edouard [phone]), editor Hattie Perch ([id number])  on 08/20/2018 8:30:55 AM   Radiology No results found.  Procedures Procedures (including critical care time)  Medications Ordered in ED Medications  sodium chloride 0.9 % bolus 1,000 mL (0 mLs Intravenous Stopped 08/19/18 1856)     Initial Impression / Assessment and Plan / ED Course  I have reviewed the triage vital signs and the nursing notes.  Pertinent labs & imaging results that were available during my care of  the patient were reviewed by me and considered in my medical decision making (see chart for details).  Clinical Course as of Aug 20 924  Wed Aug 19, 2018  1852 Was informed by the tech that the patient wishes to speak with me. Patient states we are doing nothing for him and he wishes to be discharged. I reviewed his labs with him.    [MB]    Clinical Course User Index [MB] Hayden Rasmussen, MD     Final Clinical Impressions(s) / ED Diagnoses   Final diagnoses:  Weakness  Alcoholic intoxication without complication [facility])    ED Discharge Orders    None       Hayden Rasmussen, MD 08/20/18 [phone]

## 2018-09-18 ENCOUNTER — Emergency Department (HOSPITAL_COMMUNITY)
Admission: EM | Admit: 2018-09-18 | Discharge: 2018-09-19 | Disposition: A | Discharge status: Home / Self Care | Payer: Medicare Other | Attending: Emergency Medicine | Admitting: Emergency Medicine

## 2018-09-18 ENCOUNTER — Encounter (HOSPITAL_COMMUNITY): Payer: Self-pay | Admitting: Emergency Medicine

## 2018-09-18 ENCOUNTER — Emergency Department (HOSPITAL_COMMUNITY): Payer: Medicare Other

## 2018-09-18 DIAGNOSIS — Z79899 Other long term (current) drug therapy: Secondary | ICD-10-CM | POA: Diagnosis not present

## 2018-09-18 DIAGNOSIS — Z87891 Personal history of nicotine dependence: Secondary | ICD-10-CM | POA: Diagnosis not present

## 2018-09-18 DIAGNOSIS — G20 Parkinson's disease: Secondary | ICD-10-CM | POA: Diagnosis not present

## 2018-09-18 DIAGNOSIS — I10 Essential (primary) hypertension: Secondary | ICD-10-CM | POA: Diagnosis not present

## 2018-09-18 DIAGNOSIS — F10920 Alcohol use, unspecified with intoxication, uncomplicated: Secondary | ICD-10-CM | POA: Diagnosis not present

## 2018-09-18 DIAGNOSIS — R42 Dizziness and giddiness: Secondary | ICD-10-CM | POA: Diagnosis present

## 2018-09-18 DIAGNOSIS — R55 Syncope and collapse: Secondary | ICD-10-CM

## 2018-09-18 DIAGNOSIS — J449 Chronic obstructive pulmonary disease, unspecified: Secondary | ICD-10-CM | POA: Diagnosis not present

## 2018-09-18 DIAGNOSIS — E860 Dehydration: Secondary | ICD-10-CM

## 2018-09-18 LAB — CBC WITH DIFFERENTIAL/PLATELET
ABS IMMATURE GRANULOCYTES: 0.12 10*3/uL — AB (ref 0.00–0.07)
BASOS ABS: 0.1 10*3/uL (ref 0.0–0.1)
Basophils Relative: 1 %
EOS ABS: 0.1 10*3/uL (ref 0.0–0.5)
Eosinophils Relative: 1 %
HEMATOCRIT: 38.9 % — AB (ref 39.0–52.0)
Hemoglobin: 12.9 g/dL — ABNORMAL LOW (ref 13.0–17.0)
IMMATURE GRANULOCYTES: 2 %
LYMPHS ABS: 0.8 10*3/uL (ref 0.7–4.0)
Lymphocytes Relative: 15 %
MCH: 34.3 pg — ABNORMAL HIGH (ref 26.0–34.0)
MCHC: 33.2 g/dL (ref 30.0–36.0)
MCV: 103.5 fL — AB (ref 80.0–100.0)
MONOS PCT: 8 %
Monocytes Absolute: 0.4 10*3/uL (ref 0.1–1.0)
Neutro Abs: 3.7 10*3/uL (ref 1.7–7.7)
Neutrophils Relative %: 73 %
Platelets: 188 10*3/uL (ref 150–400)
RBC: 3.76 MIL/uL — ABNORMAL LOW (ref 4.22–5.81)
RDW: 14.7 % (ref 11.5–15.5)
WBC: 5.1 10*3/uL (ref 4.0–10.5)
nRBC: 0 % (ref 0.0–0.2)

## 2018-09-18 LAB — COMPREHENSIVE METABOLIC PANEL
ALBUMIN: 4 g/dL (ref 3.5–5.0)
ALK PHOS: 49 U/L (ref 38–126)
ALT: 36 U/L (ref 0–44)
ANION GAP: 18 — AB (ref 5–15)
AST: 55 U/L — AB (ref 15–41)
BILIRUBIN TOTAL: 1.1 mg/dL (ref 0.3–1.2)
BUN: 23 mg/dL (ref 8–23)
CALCIUM: 9.4 mg/dL (ref 8.9–10.3)
CO2: 20 mmol/L — ABNORMAL LOW (ref 22–32)
CREATININE: 1.57 mg/dL — AB (ref 0.61–1.24)
Chloride: 104 mmol/L (ref 98–111)
GFR calc Af Amer: 44 mL/min — ABNORMAL LOW (ref >60)
GFR calc non Af Amer: 38 mL/min — ABNORMAL LOW (ref >60)
GLUCOSE: 82 mg/dL (ref 70–99)
Potassium: 4.1 mmol/L (ref 3.5–5.1)
Sodium: 142 mmol/L (ref 135–145)
TOTAL PROTEIN: 7.2 g/dL (ref 6.5–8.1)

## 2018-09-18 LAB — I-STAT TROPONIN, ED: Troponin i, poc: 0.03 ng/mL (ref 0.00–0.08)

## 2018-09-18 LAB — LIPASE, BLOOD: Lipase: 26 U/L (ref 11–51)

## 2018-09-18 LAB — ETHANOL: ALCOHOL ETHYL (B): 222 mg/dL — AB (ref <10)

## 2018-09-18 MED ORDER — SODIUM CHLORIDE 0.9 % IV BOLUS
1000.0000 mL | Freq: Once | INTRAVENOUS | Status: AC
  Administered 2018-09-18: 1000 mL via INTRAVENOUS

## 2018-09-18 NOTE — Discharge Instructions (Signed)
You were evaluated in the emergency department for feeling lightheaded.  Your lab work showed you to be mildly dehydrated and you received IV fluids here.  Your alcohol level was also very elevated here.  This is probably contributing to your dehydration and your lightheadedness.  You should contact your primary care doctor for follow-up.

## 2018-09-18 NOTE — ED Notes (Signed)
Pt in Ct  

## 2018-09-18 NOTE — ED Provider Notes (Signed)
[place] DEPT Provider Note   CSN: [id number] Arrival date & time: 09/18/18  1846     History   Chief Complaint No chief complaint on file.   HPI Victor Clarke is a 84 y.o. male.  He presents by ambulance after feeling acutely dizzy/lightheaded at home.  States he was standing in the kitchen cooking dinner when this feeling came on him and he had to lower himself to the floor.  He was able to call 911 and presents here.  He said he had similar episodes to this a month or 2 ago but has not had any recently.  He denies any injury from lowering herself to the ground.  Denies loss of consciousness.  No nausea vomiting diarrhea chest pain abdominal pain shortness of breath fevers or chills.  No urinary symptoms.  He states he does not eat and drink enough because he lives alone and has no motivation to do these things.  The history is provided by the patient.  Loss of Consciousness   This is a new problem. The current episode started less than 1 hour ago. The problem occurs rarely. The problem has been resolved. There was no loss of consciousness. The problem is associated with normal activity. Associated symptoms include light-headedness and weakness. Pertinent negatives include abdominal pain, bowel incontinence, chest pain, fever, focal weakness, headaches, nausea, visual change and vomiting. He has tried nothing for the symptoms. The treatment provided no relief. His past medical history is significant for HTN.    Past Medical History:  Diagnosis Date  . Alcoholic peripheral neuropathy ([place])   . Allergy   . Arthritis   . Cataract    left eye cataract removed  . COPD (chronic obstructive pulmonary disease) ([place])    pt denies 02-03-17  . Detached retina    a. s/p surgical correction on the right.  . Ectopic atrial tachycardia ([place])    a. 08/2014 Echo: EF 55-60%, mildly dil LA.  Marland Kitchen Gout   . Hypercholesteremia   . Hypertension   . Left inguinal hernia     a. s/p repair ~ 10 yrs ago.  . Parkinson's disease ([place])   . Prostate cancer ([place])    a. s/p TURP.    Patient Active Problem List   Diagnosis Date Noted  . First degree AV block   . Hypotension due to hypovolemia   . Syncope 06/18/2018  . Acute renal failure superimposed on stage 3 chronic kidney disease ([place]) 06/18/2018  . Leukopenia 06/18/2018  . AV block, Mobitz II 06/18/2018  . Memory loss 10/22/2016  . Malnutrition of moderate degree 11/15/2015  . Fall from steps 11/07/2015  . Anemia 11/07/2015  . Alcohol dependence ([place]) 11/07/2015  . Pyuria 11/07/2015  . Dehydration 08/08/2015  . UTI (urinary tract infection) 08/08/2015  . High anion gap metabolic acidosis 27/01/5008  . Dizziness 07/31/2015  . Orthostatic hypotension 07/31/2015  . Lactic acidosis 07/31/2015  . Ectopic atrial tachycardia ([place])   . Hypercholesteremia   . Dyspnea on exertion 09/16/2014  . Hypertension 09/16/2014    Past Surgical History:  Procedure Laterality Date  . COLONOSCOPY    . COLONOSCOPY W/ POLYPECTOMY  2001, 2006  . HERNIA REPAIR    . PROSTATE SURGERY          Home Medications    Prior to Admission medications   Medication Sig Start Date End Date Taking? Authorizing Provider  aspirin EC 81 MG tablet Take 81 mg by mouth daily.  [provider]  Cholecalciferol (VITAMIN D3) 5000 units CAPS Take 10,000 Units by mouth daily.    [provider]  Desoximetasone 0.25 % LIQD  07/13/18   [provider]  feeding supplement, ENSURE ENLIVE, (ENSURE ENLIVE) LIQD Take 237 mLs by mouth 3 (three) times daily between meals. Patient not taking: Reported on 08/19/2018 08/08/15   Kelvin Cellar, MD  folic acid (FOLVITE) 299 MCG tablet Take 800 mcg by mouth daily.    [provider]  Magnesium Oxide 250 MG TABS Take 500 mg by mouth daily.    [provider]  Multiple Vitamin (MULTIVITAMIN) tablet Take 1 tablet by mouth daily.    [provider]    rosuvastatin (CRESTOR) 20 MG tablet Take 20 mg by mouth daily.  03/17/18   [provider]    Family History Family History  Problem Relation Age of Onset  . Stroke Father        died @ 48.  Marland Kitchen Heart attack Mother        died @ 67  . Stroke Brother        died in his 16's  . Heart attack Brother        died in his 73's  . Colon cancer Neg Hx   . Esophageal cancer Neg Hx   . Rectal cancer Neg Hx   . Stomach cancer Neg Hx   . Prostate cancer Neg Hx     Social History Social History   Tobacco Use  . Smoking status: Former Smoker    Packs/day: 2.00    Years: 22.00    Pack years: 44.00    Types: Cigarettes    Last attempt to quit: 11/18/1977    Years since quitting: 40.8  . Smokeless tobacco: Never Used  Substance Use Topics  . Alcohol use: No    Alcohol/week: 2.0 standard drinks    Types: 1 Shots of liquor, 1 Standard drinks or equivalent per week    Comment: None currently since previous December.   . Drug use: No     Allergies   Patient has no known allergies.   Review of Systems Review of Systems  Constitutional: Positive for appetite change. Negative for fever.  HENT: Negative for sore throat.   Eyes: Negative for visual disturbance.  Respiratory: Negative for shortness of breath.   Cardiovascular: Positive for syncope. Negative for chest pain.  Gastrointestinal: Negative for abdominal pain, bowel incontinence, nausea and vomiting.  Genitourinary: Negative for dysuria.  Musculoskeletal: Negative for neck pain.  Skin: Negative for rash.  Neurological: Positive for weakness and light-headedness. Negative for focal weakness and headaches.     Physical Exam Updated Vital Signs BP 136/77 (BP Location: Right Arm)   Pulse 90   Temp 98.6 F (37 C) (Oral)   Resp 20   SpO2 100%   Physical Exam  Constitutional: He appears well-developed and well-nourished.  HENT:  Head: Normocephalic and atraumatic.  Eyes: Conjunctivae are normal.  Neck: Neck  supple.  Cardiovascular: Normal rate, regular rhythm and normal heart sounds.  No murmur heard. Pulmonary/Chest: Effort normal and breath sounds normal. No respiratory distress.  Abdominal: Soft. There is no tenderness.  Musculoskeletal: He exhibits no edema, tenderness or deformity.  He has some old bruising on his forearms.  Neurological: He is alert. He has normal strength. No cranial nerve deficit. GCS eye subscore is 4. GCS verbal subscore is 5. GCS motor subscore is 6.  Skin: Skin is warm and dry. Capillary  refill takes less than 2 seconds.  Psychiatric: He has a normal mood and affect.  Nursing note and vitals reviewed.    ED Treatments / Results  Labs (all labs ordered are listed, but only abnormal results are displayed) Labs Reviewed  COMPREHENSIVE METABOLIC PANEL - Abnormal; Notable for the following components:      Result Value   CO2 20 (*)    Creatinine, Ser 1.57 (*)    AST 55 (*)    GFR calc non Af Amer 38 (*)    GFR calc Af Amer 44 (*)    Anion gap 18 (*)    All other components within normal limits  CBC WITH DIFFERENTIAL/PLATELET - Abnormal; Notable for the following components:   RBC 3.76 (*)    Hemoglobin 12.9 (*)    HCT 38.9 (*)    MCV 103.5 (*)    MCH 34.3 (*)    Abs Immature Granulocytes 0.12 (*)    All other components within normal limits  ETHANOL - Abnormal; Notable for the following components:   Alcohol, Ethyl (B) 222 (*)    All other components within normal limits  LIPASE, BLOOD  URINALYSIS, ROUTINE W REFLEX MICROSCOPIC  I-STAT TROPONIN, ED    EKG EKG Interpretation  Date/Time:  Friday September 18 2018 19:56:35 EDT Ventricular Rate:  77 PR Interval:    QRS Duration: 116 QT Interval:  409 QTC Calculation: 463 R Axis:   -156 Text Interpretation:  Prolonged PR interval IRBBB and LPFB Low voltage, extremity leads similar pattern to prior -unclear why pacer spikes compared with prior 10/19 Confirmed by Aletta Edouard [phone]) on 09/18/2018  8:07:58 PM   Radiology Ct Head Wo Contrast  Result Date: 09/18/2018 CLINICAL DATA:  Dizziness for several days. Unknown loss of consciousness or fall. EXAM: CT HEAD WITHOUT CONTRAST TECHNIQUE: Contiguous axial images were obtained from the base of the skull through the vertex without intravenous contrast. COMPARISON:  06/18/2018 and 11/10/2015 FINDINGS: BRAIN: There is sulcal and ventricular prominence consistent with superficial and central atrophy. No intraparenchymal hemorrhage, mass effect nor midline shift. Periventricular and subcortical white matter hypodensities consistent with chronic small vessel ischemic disease are identified. No acute large vascular territory infarcts. No abnormal extra-axial fluid collections. Basal cisterns are not effaced and midline. VASCULAR: Moderate calcific atherosclerosis of the carotid siphons. SKULL: No skull fracture. No significant scalp soft tissue swelling. SINUSES/ORBITS: The mastoid air-cells are clear. The included paranasal sinuses are well-aerated.Probable glaucoma reservoir on the right and cataract resection on the left. OTHER: None. IMPRESSION: Atrophy with chronic microvascular ischemia, stable in appearance without acute intracranial abnormality. Electronically Signed   By: Ashley Royalty M.D.   On: 09/18/2018 20:03    Procedures Procedures (including critical care time)  Medications Ordered in ED Medications  sodium chloride 0.9 % bolus 1,000 mL (has no administration in time range)     Initial Impression / Assessment and Plan / ED Course  I have reviewed the triage vital signs and the nursing notes.  Pertinent labs & imaging results that were available during my care of the patient were reviewed by me and considered in my medical decision making (see chart for details).  Clinical Course as of Sep 18 2321  Fri Sep 18, 2018  2056 Reevaluated patient.  He is quite angry that he is been here since 5:00 and nobody is given him anything to eat.   I informed the patient that he got here at 7:00 and that we were waiting for his  labs to come back but he can have something to eat now.  He says is been very unhappy with the way the hospital has been treating him.   [MB]  2058 Patient's lab work significant for an elevated creatinine that is within his baseline and a low CO2 that is also at his baseline.  His alcohol level is also quite elevated at 222.  We will continue to hydrate him and likely he will be discharged back to home.   [MB]    Clinical Course User Index [MB] Hayden Rasmussen, MD     Final Clinical Impressions(s) / ED Diagnoses   Final diagnoses:  Near syncope  Alcoholic intoxication without complication [facility])  Dehydration    ED Discharge Orders    None       Hayden Rasmussen, MD 09/18/18 2323

## 2018-09-18 NOTE — ED Triage Notes (Signed)
Per EMS, patient from home, c/o "dizzy spells for a couple of days." Unknown LOC or fall.  Patient is poor historian with difficulty hearing. Patient denies complaints at this time. Denies N/V/D, abdominal pain, chest pain, SOB. Hx a-fib. A&Ox4.   BP 126/79 HR 85 RR 22 O2 94%

## 2018-09-26 ENCOUNTER — Encounter (HOSPITAL_COMMUNITY): Payer: Self-pay

## 2018-09-26 ENCOUNTER — Emergency Department (HOSPITAL_COMMUNITY): Payer: Medicare Other

## 2018-09-26 ENCOUNTER — Emergency Department (HOSPITAL_COMMUNITY)
Admission: EM | Admit: 2018-09-26 | Discharge: 2018-09-26 | Disposition: A | Discharge status: Home / Self Care | Payer: Medicare Other | Attending: Emergency Medicine | Admitting: Emergency Medicine

## 2018-09-26 ENCOUNTER — Other Ambulatory Visit: Payer: Self-pay

## 2018-09-26 DIAGNOSIS — J449 Chronic obstructive pulmonary disease, unspecified: Secondary | ICD-10-CM | POA: Insufficient documentation

## 2018-09-26 DIAGNOSIS — I10 Essential (primary) hypertension: Secondary | ICD-10-CM | POA: Insufficient documentation

## 2018-09-26 DIAGNOSIS — F419 Anxiety disorder, unspecified: Secondary | ICD-10-CM | POA: Diagnosis not present

## 2018-09-26 DIAGNOSIS — Z87891 Personal history of nicotine dependence: Secondary | ICD-10-CM | POA: Insufficient documentation

## 2018-09-26 DIAGNOSIS — R42 Dizziness and giddiness: Secondary | ICD-10-CM

## 2018-09-26 DIAGNOSIS — Z7982 Long term (current) use of aspirin: Secondary | ICD-10-CM | POA: Diagnosis not present

## 2018-09-26 DIAGNOSIS — R531 Weakness: Secondary | ICD-10-CM | POA: Diagnosis present

## 2018-09-26 DIAGNOSIS — Z79899 Other long term (current) drug therapy: Secondary | ICD-10-CM | POA: Diagnosis not present

## 2018-09-26 LAB — I-STAT TROPONIN, ED: TROPONIN I, POC: 0.02 ng/mL (ref 0.00–0.08)

## 2018-09-26 LAB — I-STAT CHEM 8, ED
BUN: 31 mg/dL — ABNORMAL HIGH (ref 8–23)
Calcium, Ion: 1.1 mmol/L — ABNORMAL LOW (ref 1.15–1.40)
Chloride: 106 mmol/L (ref 98–111)
Creatinine, Ser: 1.4 mg/dL — ABNORMAL HIGH (ref 0.61–1.24)
Glucose, Bld: 110 mg/dL — ABNORMAL HIGH (ref 70–99)
HEMATOCRIT: 39 % (ref 39.0–52.0)
HEMOGLOBIN: 13.3 g/dL (ref 13.0–17.0)
POTASSIUM: 3.9 mmol/L (ref 3.5–5.1)
SODIUM: 141 mmol/L (ref 135–145)
TCO2: 18 mmol/L — AB (ref 22–32)

## 2018-09-26 LAB — ETHANOL: ALCOHOL ETHYL (B): 132 mg/dL — AB (ref <10)

## 2018-09-26 NOTE — Discharge Instructions (Addendum)
Return if you are worse at any time Recheck with your doctor next week.

## 2018-09-26 NOTE — ED Notes (Signed)
Pt. Ambulated well, using cane. He stated he did not have SOB, no dizziness, walked at a brisk pace.

## 2018-09-26 NOTE — ED Notes (Signed)
Called mini lab to have them send dark green to main lab.

## 2018-09-26 NOTE — ED Provider Notes (Signed)
[facility] EMERGENCY DEPARTMENT Provider Note   CSN: [id number] Arrival date & time: 09/26/18  1630     History   Chief Complaint Chief Complaint  Patient presents with  . Weakness  . Shortness of Breath    HPI ABDULLOH ULLOM is a 84 y.o. male.  HPI Chief complaint anxiety 84 year old man who states that he has had some dizziness over the past month. hE STATES he is mainly anxious.  He states that he fell a month ago and was seen at that time.  He states since that time he continued to feel somewhat lightheaded occasionally and dizzy.  He denies having any difficulty performing his activities of daily living.  He states he has had some dyspnea but no chest pain.  He has not noted fever, cough, nausea, vomiting, or diarrhea.  He ambulates at home with a cane and drives.  He lives alone.  He denies recent fall, head injury, neck pain, numbness, lateralized weakness, chest pain, abdominal pain, extremity injury.  He states his anxiety is moderate.  He did not take his medications today.  Past Medical History:  Diagnosis Date  . Alcoholic peripheral neuropathy ([place])   . Allergy   . Arthritis   . Cataract    left eye cataract removed  . COPD (chronic obstructive pulmonary disease) ([place])    pt denies 02-03-17  . Detached retina    a. s/p surgical correction on the right.  . Ectopic atrial tachycardia ([place])    a. 08/2014 Echo: EF 55-60%, mildly dil LA.  Marland Kitchen Gout   . Hypercholesteremia   . Hypertension   . Left inguinal hernia    a. s/p repair ~ 10 yrs ago.  . Parkinson's disease ([place])   . Prostate cancer ([place])    a. s/p TURP.    Patient Active Problem List   Diagnosis Date Noted  . First degree AV block   . Hypotension due to hypovolemia   . Syncope 06/18/2018  . Acute renal failure superimposed on stage 3 chronic kidney disease ([place]) 06/18/2018  . Leukopenia 06/18/2018  . AV block, Mobitz II 06/18/2018  . Memory loss 10/22/2016  . Malnutrition of  moderate degree 11/15/2015  . Fall from steps 11/07/2015  . Anemia 11/07/2015  . Alcohol dependence ([place]) 11/07/2015  . Pyuria 11/07/2015  . Dehydration 08/08/2015  . UTI (urinary tract infection) 08/08/2015  . High anion gap metabolic acidosis 63/11/6008  . Dizziness 07/31/2015  . Orthostatic hypotension 07/31/2015  . Lactic acidosis 07/31/2015  . Ectopic atrial tachycardia ([place])   . Hypercholesteremia   . Dyspnea on exertion 09/16/2014  . Hypertension 09/16/2014    Past Surgical History:  Procedure Laterality Date  . COLONOSCOPY    . COLONOSCOPY W/ POLYPECTOMY  2001, 2006  . HERNIA REPAIR    . PROSTATE SURGERY          Home Medications    Prior to Admission medications   Medication Sig Start Date End Date Taking? Authorizing Provider  aspirin EC 81 MG tablet Take 81 mg by mouth every Monday, Wednesday, and Friday.     [provider]  Cholecalciferol (VITAMIN D3) 5000 units CAPS Take 10,000 Units by mouth daily.    [provider]  Desoximetasone 0.25 % LIQD Apply 1 application topically 3 (three) times daily.  07/13/18   [provider]  feeding supplement, ENSURE ENLIVE, (ENSURE ENLIVE) LIQD Take 237 mLs by mouth 3 (three) times daily between meals. Patient taking differently:  Take 237 mLs by mouth 2 (two) times daily.  08/08/15   Kelvin Cellar, MD  folic acid (FOLVITE) 161 MCG tablet Take 800 mcg by mouth daily.    [provider]  Magnesium Oxide 250 MG TABS Take 500 mg by mouth daily.    [provider]  Multiple Vitamin (MULTIVITAMIN) tablet Take 1 tablet by mouth daily.    [provider]  rosuvastatin (CRESTOR) 20 MG tablet Take 20 mg by mouth daily.  03/17/18   [provider]    Family History Family History  Problem Relation Age of Onset  . Stroke Father        died @ 30.  Marland Kitchen Heart attack Mother        died @ 32  . Stroke Brother        died in his 62's  . Heart attack Brother        died  in his 61's  . Colon cancer Neg Hx   . Esophageal cancer Neg Hx   . Rectal cancer Neg Hx   . Stomach cancer Neg Hx   . Prostate cancer Neg Hx     Social History Social History   Tobacco Use  . Smoking status: Former Smoker    Packs/day: 2.00    Years: 22.00    Pack years: 44.00    Types: Cigarettes    Last attempt to quit: 11/18/1977    Years since quitting: 40.8  . Smokeless tobacco: Never Used  Substance Use Topics  . Alcohol use: No    Alcohol/week: 2.0 standard drinks    Types: 1 Shots of liquor, 1 Standard drinks or equivalent per week    Comment: None currently since previous December.   . Drug use: No     Allergies   Patient has no known allergies.   Review of Systems Review of Systems  All other systems reviewed and are negative.    Physical Exam Updated Vital Signs Pulse 85   Temp 97.6 F (36.4 C) (Oral)   Resp 18   SpO2 99%   Physical Exam  Constitutional: He is oriented to person, place, and time. He appears well-developed and well-nourished.  HENT:  Head: Normocephalic and atraumatic.  Mouth/Throat: Oropharynx is clear and moist.  Eyes: Pupils are equal, round, and reactive to light. EOM are normal.  Neck: Normal range of motion. Neck supple.  Cardiovascular: Normal rate, regular rhythm, normal heart sounds and intact distal pulses.  Pulmonary/Chest: Effort normal and breath sounds normal.  Abdominal: Bowel sounds are normal.  Musculoskeletal: Normal range of motion.       Right lower leg: Normal.       Left lower leg: Normal.  Neurological: He is alert and oriented to person, place, and time. He has normal strength. No cranial nerve deficit. He displays a negative Romberg sign.  Reflex Scores:      Patellar reflexes are 2+ on the right side and 2+ on the left side. Skin: Skin is warm and dry. Capillary refill takes less than 2 seconds.  Psychiatric: His speech is normal. His mood appears anxious.  Nursing note and vitals reviewed.    ED  Treatments / Results  Labs (all labs ordered are listed, but only abnormal results are displayed) Labs Reviewed  I-STAT CHEM 8, ED - Abnormal; Notable for the following components:      Result Value   BUN 31 (*)    Creatinine, Ser 1.40 (*)    Glucose, Bld  110 (*)    Calcium, Ion 1.10 (*)    TCO2 18 (*)    All other components within normal limits  ETHANOL  I-STAT TROPONIN, ED    EKG EKG Interpretation  Date/Time:  Saturday September 26 2018 18:35:57 EST Ventricular Rate:  81 PR Interval:    QRS Duration: 107 QT Interval:  395 QTC Calculation: 459 R Axis:   -76 Text Interpretation:  Sinus rhythm Multiple ventricular premature complexes Prolonged PR interval Incomplete RBBB and LAFB Abnormal R-wave progression, late transition Confirmed by Pattricia Boss [phone]) on 09/26/2018 6:50:33 PM   Radiology Dg Chest Port 1 View  Result Date: 09/26/2018 CLINICAL DATA:  Shortness of breath. EXAM: PORTABLE CHEST 1 VIEW COMPARISON:  Radiographs of June 18, 2018. FINDINGS: Stable cardiomediastinal silhouette. Atherosclerosis of thoracic aorta is noted. No pneumothorax or pleural effusion is noted. Bony thorax is unremarkable. Stable bibasilar scarring is noted. No acute abnormality is noted. IMPRESSION: No acute cardiopulmonary abnormality seen. Aortic Atherosclerosis (ICD10-I70.0). Electronically Signed   By: Marijo Conception, M.D.   On: 09/26/2018 18:20    Procedures Procedures (including critical care time)  Medications Ordered in ED Medications - No data to display   Initial Impression / Assessment and Plan / ED Course  I have reviewed the triage vital signs and the nursing notes.  Pertinent labs & imaging results that were available during my care of the patient were reviewed by me and considered in my medical decision making (see chart for details).     84 y.o male here with anxiety and dizziness.  NO focal deficits, vertigo or orthostatic changes here.  He has been stable with  stable labs, ekg, and cxr.  Patient ambulatory here at baseline.  Advised to return if any worsening symptoms and to follow with his doctor next week.    Final Clinical Impressions(s) / ED Diagnoses   Final diagnoses:  Anxiety  Lightheaded    ED Discharge Orders    None       Pattricia Boss, MD 09/26/18 6144

## 2018-09-26 NOTE — ED Notes (Signed)
Discharge instructions discussed with Pt. Pt verbalized understanding. Pt stable and ambulatory.    

## 2018-09-26 NOTE — ED Notes (Signed)
Per main lab, they have received the dark green tube for the ethanol.

## 2018-09-26 NOTE — ED Triage Notes (Signed)
To room via EMS from home.  Was hospitalized around 1 week ago for weakness and exertional shortness of breath x 1 month.  Pt still having intermittant weakness and shortness of breath.  Pt states "anxiety" is what brought him to ED, he doesn't want to be home alone if he gets dizziness and falling.

## 2018-09-30 ENCOUNTER — Ambulatory Visit: Payer: Medicare Other | Admitting: Cardiology

## 2018-10-01 ENCOUNTER — Encounter: Payer: Self-pay | Admitting: Cardiology

## 2018-10-24 ENCOUNTER — Encounter (HOSPITAL_COMMUNITY): Payer: Self-pay

## 2018-10-24 ENCOUNTER — Other Ambulatory Visit: Payer: Self-pay

## 2018-10-24 ENCOUNTER — Emergency Department (HOSPITAL_COMMUNITY)
Admission: EM | Admit: 2018-10-24 | Discharge: 2018-10-24 | Disposition: A | Discharge status: Home / Self Care | Payer: Medicare Other | Attending: Emergency Medicine | Admitting: Emergency Medicine

## 2018-10-24 DIAGNOSIS — W19XXXA Unspecified fall, initial encounter: Secondary | ICD-10-CM | POA: Diagnosis not present

## 2018-10-24 DIAGNOSIS — Z7982 Long term (current) use of aspirin: Secondary | ICD-10-CM | POA: Diagnosis not present

## 2018-10-24 DIAGNOSIS — Z79899 Other long term (current) drug therapy: Secondary | ICD-10-CM | POA: Diagnosis not present

## 2018-10-24 DIAGNOSIS — F10920 Alcohol use, unspecified with intoxication, uncomplicated: Secondary | ICD-10-CM | POA: Diagnosis not present

## 2018-10-24 DIAGNOSIS — Z8546 Personal history of malignant neoplasm of prostate: Secondary | ICD-10-CM | POA: Insufficient documentation

## 2018-10-24 DIAGNOSIS — G20 Parkinson's disease: Secondary | ICD-10-CM | POA: Diagnosis not present

## 2018-10-24 DIAGNOSIS — R630 Anorexia: Secondary | ICD-10-CM | POA: Insufficient documentation

## 2018-10-24 DIAGNOSIS — Z87891 Personal history of nicotine dependence: Secondary | ICD-10-CM | POA: Diagnosis not present

## 2018-10-24 DIAGNOSIS — E860 Dehydration: Secondary | ICD-10-CM | POA: Insufficient documentation

## 2018-10-24 DIAGNOSIS — I10 Essential (primary) hypertension: Secondary | ICD-10-CM | POA: Diagnosis not present

## 2018-10-24 DIAGNOSIS — J449 Chronic obstructive pulmonary disease, unspecified: Secondary | ICD-10-CM | POA: Insufficient documentation

## 2018-10-24 DIAGNOSIS — R42 Dizziness and giddiness: Secondary | ICD-10-CM | POA: Insufficient documentation

## 2018-10-24 LAB — COMPREHENSIVE METABOLIC PANEL
ALBUMIN: 3.3 g/dL — AB (ref 3.5–5.0)
ALT: 47 U/L — AB (ref 0–44)
AST: 68 U/L — AB (ref 15–41)
Alkaline Phosphatase: 42 U/L (ref 38–126)
Anion gap: 14 (ref 5–15)
BILIRUBIN TOTAL: 0.9 mg/dL (ref 0.3–1.2)
BUN: 17 mg/dL (ref 8–23)
CO2: 21 mmol/L — ABNORMAL LOW (ref 22–32)
CREATININE: 0.93 mg/dL (ref 0.61–1.24)
Calcium: 8.4 mg/dL — ABNORMAL LOW (ref 8.9–10.3)
Chloride: 109 mmol/L (ref 98–111)
GFR calc Af Amer: >60 mL/min (ref >60)
GLUCOSE: 77 mg/dL (ref 70–99)
POTASSIUM: 3.7 mmol/L (ref 3.5–5.1)
Sodium: 144 mmol/L (ref 135–145)
TOTAL PROTEIN: 6 g/dL — AB (ref 6.5–8.1)

## 2018-10-24 LAB — CBC
HEMATOCRIT: 34.7 % — AB (ref 39.0–52.0)
Hemoglobin: 11.5 g/dL — ABNORMAL LOW (ref 13.0–17.0)
MCH: 35.6 pg — ABNORMAL HIGH (ref 26.0–34.0)
MCHC: 33.1 g/dL (ref 30.0–36.0)
MCV: 107.4 fL — AB (ref 80.0–100.0)
NRBC: 0 % (ref 0.0–0.2)
Platelets: 129 10*3/uL — ABNORMAL LOW (ref 150–400)
RBC: 3.23 MIL/uL — AB (ref 4.22–5.81)
RDW: 14.1 % (ref 11.5–15.5)
WBC: 2.8 10*3/uL — AB (ref 4.0–10.5)

## 2018-10-24 LAB — MAGNESIUM: Magnesium: 1.6 mg/dL — ABNORMAL LOW (ref 1.7–2.4)

## 2018-10-24 LAB — ETHANOL: Alcohol, Ethyl (B): 163 mg/dL — ABNORMAL HIGH (ref <10)

## 2018-10-24 MED ORDER — SODIUM CHLORIDE 0.9 % IV BOLUS
1000.0000 mL | Freq: Once | INTRAVENOUS | Status: AC
  Administered 2018-10-24: 1000 mL via INTRAVENOUS

## 2018-10-24 NOTE — ED Notes (Signed)
Bed: VG71 Expected date:  Expected time:  Means of arrival:  Comments: EMS/dizzy/fall

## 2018-10-24 NOTE — ED Provider Notes (Signed)
[facility] DEPT Provider Note   CSN: [id number] Arrival date & time: 10/24/18  1610     History   Chief Complaint Chief Complaint  Patient presents with  . Fall  . Dizziness    HPI Victor Clarke is a 84 y.o. male.  HPI Patient is an 84 year old male with a history of alcohol abuse who presents the emergency department complaining of dizziness with generalized decreased oral intake.  He states he has no appetite.  He drinks alcohol.  He states that he does not eat or drink much.  He feels weak and dizzy.  Denies recent head injury.  Denies fevers or chills.  No chest pain or shortness of breath.  Denies abdominal pain.   Past Medical History:  Diagnosis Date  . Alcoholic peripheral neuropathy ([place])   . Allergy   . Arthritis   . Cataract    left eye cataract removed  . COPD (chronic obstructive pulmonary disease) ([place])    pt denies 02-03-17  . Detached retina    a. s/p surgical correction on the right.  . Ectopic atrial tachycardia ([place])    a. 08/2014 Echo: EF 55-60%, mildly dil LA.  Marland Kitchen Gout   . Hypercholesteremia   . Hypertension   . Left inguinal hernia    a. s/p repair ~ 10 yrs ago.  . Parkinson's disease ([place])   . Prostate cancer ([place])    a. s/p TURP.    Patient Active Problem List   Diagnosis Date Noted  . First degree AV block   . Hypotension due to hypovolemia   . Syncope 06/18/2018  . Acute renal failure superimposed on stage 3 chronic kidney disease ([place]) 06/18/2018  . Leukopenia 06/18/2018  . AV block, Mobitz II 06/18/2018  . Memory loss 10/22/2016  . Malnutrition of moderate degree 11/15/2015  . Fall from steps 11/07/2015  . Anemia 11/07/2015  . Alcohol dependence ([place]) 11/07/2015  . Pyuria 11/07/2015  . Dehydration 08/08/2015  . UTI (urinary tract infection) 08/08/2015  . High anion gap metabolic acidosis 47/07/6282  . Dizziness 07/31/2015  . Orthostatic hypotension 07/31/2015  . Lactic acidosis 07/31/2015  .  Ectopic atrial tachycardia ([place])   . Hypercholesteremia   . Dyspnea on exertion 09/16/2014  . Hypertension 09/16/2014    Past Surgical History:  Procedure Laterality Date  . COLONOSCOPY    . COLONOSCOPY W/ POLYPECTOMY  2001, 2006  . HERNIA REPAIR    . PROSTATE SURGERY          Home Medications    Prior to Admission medications   Medication Sig Start Date End Date Taking? Authorizing Provider  aspirin EC 81 MG tablet Take 81 mg by mouth every Monday, Wednesday, and Friday.     [provider]  Cholecalciferol (VITAMIN D3) 5000 units CAPS Take 10,000 Units by mouth daily.    [provider]  Dermatological Products, [place]. (LEVICYN) GEL Apply 1 application topically daily.    [provider]  feeding supplement, ENSURE ENLIVE, (ENSURE ENLIVE) LIQD Take 237 mLs by mouth 3 (three) times daily between meals. Patient taking differently: Take 237 mLs by mouth 2 (two) times daily.  08/08/15   Kelvin Cellar, MD  folic acid (FOLVITE) 662 MCG tablet Take 800 mcg by mouth daily.    [provider]  Magnesium Oxide 250 MG TABS Take 500 mg by mouth daily.    [provider]  Multiple Vitamin (MULTIVITAMIN) tablet Take 1 tablet by mouth daily.  [provider]  rosuvastatin (CRESTOR) 20 MG tablet Take 20 mg by mouth daily.  03/17/18   [provider]    Family History Family History  Problem Relation Age of Onset  . Stroke Father        died @ 91.  Marland Kitchen Heart attack Mother        died @ 30  . Stroke Brother        died in his 68's  . Heart attack Brother        died in his 27's  . Colon cancer Neg Hx   . Esophageal cancer Neg Hx   . Rectal cancer Neg Hx   . Stomach cancer Neg Hx   . Prostate cancer Neg Hx     Social History Social History   Tobacco Use  . Smoking status: Former Smoker    Packs/day: 2.00    Years: 22.00    Pack years: 44.00    Types: Cigarettes    Last attempt to quit: 11/18/1977    Years since  quitting: 40.9  . Smokeless tobacco: Never Used  Substance Use Topics  . Alcohol use: No    Alcohol/week: 2.0 standard drinks    Types: 1 Shots of liquor, 1 Standard drinks or equivalent per week    Comment: None currently since previous December.   . Drug use: No     Allergies   Patient has no known allergies.   Review of Systems Review of Systems  All other systems reviewed and are negative.    Physical Exam Updated Vital Signs BP 117/73   Pulse (!) 58   Temp 97.8 F (36.6 C) (Oral)   Resp 18   Ht 5\' 9"  (1.753 m)   Wt 68 kg   SpO2 92%   BMI 22.15 kg/m   Physical Exam  Constitutional: He is oriented to person, place, and time. He appears well-developed and well-nourished.  HENT:  Head: Normocephalic and atraumatic.  Eyes: Pupils are equal, round, and reactive to light. EOM are normal.  Neck: Normal range of motion.  Cardiovascular: Normal rate, regular rhythm, normal heart sounds and intact distal pulses.  Pulmonary/Chest: Effort normal and breath sounds normal. No respiratory distress.  Abdominal: Soft. He exhibits no distension. There is no tenderness.  Musculoskeletal: Normal range of motion.  Neurological: He is alert and oriented to person, place, and time.  5/5 strength in major muscle groups of  bilateral upper and lower extremities. Speech normal. No facial asymetry.   Skin: Skin is warm and dry.  Psychiatric: He has a normal mood and affect. Judgment normal.  Nursing note and vitals reviewed.    ED Treatments / Results  Labs (all labs ordered are listed, but only abnormal results are displayed) Labs Reviewed  CBC - Abnormal; Notable for the following components:      Result Value   WBC 2.8 (*)    RBC 3.23 (*)    Hemoglobin 11.5 (*)    HCT 34.7 (*)    MCV 107.4 (*)    MCH 35.6 (*)    Platelets 129 (*)    All other components within normal limits  COMPREHENSIVE METABOLIC PANEL - Abnormal; Notable for the following components:   CO2 21 (*)     Calcium 8.4 (*)    Total Protein 6.0 (*)    Albumin 3.3 (*)    AST 68 (*)    ALT 47 (*)    All other components within normal limits  ETHANOL - Abnormal; Notable for the following components:   Alcohol, Ethyl (B) 163 (*)    All other components within normal limits  MAGNESIUM - Abnormal; Notable for the following components:   Magnesium 1.6 (*)    All other components within normal limits    EKG None  Radiology No results found.  Procedures Procedures (including critical care time)  Medications Ordered in ED Medications  sodium chloride 0.9 % bolus 1,000 mL (0 mLs Intravenous Stopped 10/24/18 1846)     Initial Impression / Assessment and Plan / ED Course  I have reviewed the triage vital signs and the nursing notes.  Pertinent labs & imaging results that were available during my care of the patient were reviewed by me and considered in my medical decision making (see chart for details).     Feels better after IV hydration at this time.  Patient encouraged to improve his diet.  Encouraged to drink water.  Told to decrease his alcohol intake.  Patient with an alcohol level of 163 here in the emergency department despite telling me he has not drink any alcohol today.  Close primary care follow-up.  Patient encouraged to return the emergency department for new or worsening symptoms  Final Clinical Impressions(s) / ED Diagnoses   Final diagnoses:  Dizziness  Alcoholic intoxication without complication ([place])  Acute dehydration    ED Discharge Orders    None       Jola Schmidt, MD 10/24/18 1902

## 2018-10-24 NOTE — ED Triage Notes (Signed)
EMS reports from home, unwitnessed fall, Pt states due to dizziness, no obvious injuries, denies LOC or pain, no blood thinners, c/o dizziness on scene states has now subsided. Hx of COPD, hypertension and Parkinson's.   BP 140/80 HR 82 RR 16 Sp02 98 RA CBG 117  20 LAC

## 2018-10-26 ENCOUNTER — Encounter (HOSPITAL_COMMUNITY): Payer: Self-pay

## 2018-10-26 ENCOUNTER — Emergency Department (HOSPITAL_COMMUNITY)
Admission: EM | Admit: 2018-10-26 | Discharge: 2018-10-26 | Disposition: A | Discharge status: Home / Self Care | Payer: Medicare Other | Attending: Emergency Medicine | Admitting: Emergency Medicine

## 2018-10-26 ENCOUNTER — Other Ambulatory Visit: Payer: Self-pay

## 2018-10-26 DIAGNOSIS — F1020 Alcohol dependence, uncomplicated: Secondary | ICD-10-CM | POA: Insufficient documentation

## 2018-10-26 DIAGNOSIS — J449 Chronic obstructive pulmonary disease, unspecified: Secondary | ICD-10-CM | POA: Diagnosis not present

## 2018-10-26 DIAGNOSIS — Z87891 Personal history of nicotine dependence: Secondary | ICD-10-CM | POA: Insufficient documentation

## 2018-10-26 DIAGNOSIS — I10 Essential (primary) hypertension: Secondary | ICD-10-CM | POA: Insufficient documentation

## 2018-10-26 DIAGNOSIS — R531 Weakness: Secondary | ICD-10-CM | POA: Diagnosis present

## 2018-10-26 HISTORY — DX: Dehydration: E86.0

## 2018-10-26 HISTORY — DX: Unspecified protein-calorie malnutrition: E46

## 2018-10-26 HISTORY — DX: Orthostatic hypotension: I95.1

## 2018-10-26 HISTORY — DX: Anemia, unspecified: D64.9

## 2018-10-26 HISTORY — DX: Unspecified fall, initial encounter: W19.XXXA

## 2018-10-26 HISTORY — DX: Alcohol dependence, uncomplicated: F10.20

## 2018-10-26 LAB — COMPREHENSIVE METABOLIC PANEL
ALT: 47 U/L — ABNORMAL HIGH (ref 0–44)
ANION GAP: 12 (ref 5–15)
AST: 72 U/L — ABNORMAL HIGH (ref 15–41)
Albumin: 3.7 g/dL (ref 3.5–5.0)
Alkaline Phosphatase: 43 U/L (ref 38–126)
BILIRUBIN TOTAL: 1.4 mg/dL — AB (ref 0.3–1.2)
BUN: 21 mg/dL (ref 8–23)
CHLORIDE: 104 mmol/L (ref 98–111)
CO2: 26 mmol/L (ref 22–32)
Calcium: 8.7 mg/dL — ABNORMAL LOW (ref 8.9–10.3)
Creatinine, Ser: 1.14 mg/dL (ref 0.61–1.24)
GFR calc Af Amer: >60 mL/min (ref >60)
GFR, EST NON AFRICAN AMERICAN: 58 mL/min — AB (ref >60)
Glucose, Bld: 111 mg/dL — ABNORMAL HIGH (ref 70–99)
POTASSIUM: 4.7 mmol/L (ref 3.5–5.1)
Sodium: 142 mmol/L (ref 135–145)
Total Protein: 6.4 g/dL — ABNORMAL LOW (ref 6.5–8.1)

## 2018-10-26 LAB — ETHANOL

## 2018-10-26 LAB — CBC WITH DIFFERENTIAL/PLATELET
ABS IMMATURE GRANULOCYTES: 0.05 10*3/uL (ref 0.00–0.07)
Basophils Absolute: 0 10*3/uL (ref 0.0–0.1)
Basophils Relative: 1 %
Eosinophils Absolute: 0 10*3/uL (ref 0.0–0.5)
Eosinophils Relative: 0 %
HCT: 36.4 % — ABNORMAL LOW (ref 39.0–52.0)
HEMOGLOBIN: 12.2 g/dL — AB (ref 13.0–17.0)
Immature Granulocytes: 1 %
LYMPHS PCT: 12 %
Lymphs Abs: 0.5 10*3/uL — ABNORMAL LOW (ref 0.7–4.0)
MCH: 36.3 pg — AB (ref 26.0–34.0)
MCHC: 33.5 g/dL (ref 30.0–36.0)
MCV: 108.3 fL — ABNORMAL HIGH (ref 80.0–100.0)
Monocytes Absolute: 0.5 10*3/uL (ref 0.1–1.0)
Monocytes Relative: 10 %
NEUTROS ABS: 3.5 10*3/uL (ref 1.7–7.7)
NRBC: 0 % (ref 0.0–0.2)
Neutrophils Relative %: 76 %
PLATELETS: 129 10*3/uL — AB (ref 150–400)
RBC: 3.36 MIL/uL — AB (ref 4.22–5.81)
RDW: 14.2 % (ref 11.5–15.5)
WBC: 4.5 10*3/uL (ref 4.0–10.5)

## 2018-10-26 LAB — I-STAT TROPONIN, ED: Troponin i, poc: 0.01 ng/mL (ref 0.00–0.08)

## 2018-10-26 MED ORDER — SODIUM CHLORIDE 0.9 % IV BOLUS
500.0000 mL | Freq: Once | INTRAVENOUS | Status: AC
  Administered 2018-10-26: 500 mL via INTRAVENOUS

## 2018-10-26 NOTE — ED Notes (Signed)
CASE MANAGER AT BEDSIDE. URINE COLLECTED WITH CULTURE

## 2018-10-26 NOTE — ED Provider Notes (Signed)
[facility] DEPT Provider Note   CSN: [id number] Arrival date & time: 10/26/18  1046     History   Chief Complaint Chief Complaint  Patient presents with  . Weakness    X 3 WEEKS    HPI Victor Clarke is a 84 y.o. male.  Patient is an 84 year old male with history of anemia, peripheral neuropathy related to alcohol consumption, hypertension.  Patient presents with complaints of weakness.  He states that this is worsened over the past 2 months.  He lives by himself at home and does not feel as though he is safe.  He states that he "does not trust himself to be alone", meaning that he believes he is going to fall.  He denies any suicidal or homicidal ideation.  He does admit to daily alcohol consumption.  He was seen here 2 days ago with a similar presentation, then discharged to home.  The history is provided by the patient.  Weakness  Primary symptoms comment: Generalized weakness. Episode onset: 2 months ago. The problem has been gradually worsening. There was no focality noted. There has been no fever. Pertinent negatives include no shortness of breath and no altered mental status.    Past Medical History:  Diagnosis Date  . Alcoholic peripheral neuropathy ([place])   . Allergy   . Anemia   . Arthritis   . Cataract    left eye cataract removed  . COPD (chronic obstructive pulmonary disease) ([place])    pt denies 02-03-17  . Dehydration   . Detached retina    a. s/p surgical correction on the right.  . Ectopic atrial tachycardia ([place])    a. 08/2014 Echo: EF 55-60%, mildly dil LA.  Marland Kitchen EtOH dependence ([place])   . Fall   . Gout   . Gout   . Hypercholesteremia   . Hypertension   . Left inguinal hernia    a. s/p repair ~ 10 yrs ago.  . Malnutrition ([place])   . Orthostatic hypotension   . Parkinson's disease ([place])   . Prostate cancer ([place])    a. s/p TURP.    Patient Active Problem List   Diagnosis Date Noted  . First degree AV block   .  Hypotension due to hypovolemia   . Syncope 06/18/2018  . Acute renal failure superimposed on stage 3 chronic kidney disease ([place]) 06/18/2018  . Leukopenia 06/18/2018  . AV block, Mobitz II 06/18/2018  . Memory loss 10/22/2016  . Malnutrition of moderate degree 11/15/2015  . Fall from steps 11/07/2015  . Anemia 11/07/2015  . Alcohol dependence ([place]) 11/07/2015  . Pyuria 11/07/2015  . Dehydration 08/08/2015  . UTI (urinary tract infection) 08/08/2015  . High anion gap metabolic acidosis 75/64/3329  . Dizziness 07/31/2015  . Orthostatic hypotension 07/31/2015  . Lactic acidosis 07/31/2015  . Ectopic atrial tachycardia ([place])   . Hypercholesteremia   . Dyspnea on exertion 09/16/2014  . Hypertension 09/16/2014    Past Surgical History:  Procedure Laterality Date  . COLONOSCOPY    . COLONOSCOPY W/ POLYPECTOMY  2001, 2006  . HERNIA REPAIR    . PROSTATE SURGERY          Home Medications    Prior to Admission medications   Medication Sig Start Date End Date Taking? Authorizing Provider  aspirin EC 81 MG tablet Take 81 mg by mouth every Monday, Wednesday, and Friday.     [provider]  Cholecalciferol (VITAMIN D3) 5000 units CAPS Take 10,000 Units  by mouth daily.    [provider]  Dermatological Products, [place]. (LEVICYN) GEL Apply 1 application topically daily.    [provider]  feeding supplement, ENSURE ENLIVE, (ENSURE ENLIVE) LIQD Take 237 mLs by mouth 3 (three) times daily between meals. Patient taking differently: Take 237 mLs by mouth 2 (two) times daily.  08/08/15   Kelvin Cellar, MD  folic acid (FOLVITE) 161 MCG tablet Take 800 mcg by mouth daily.    [provider]  Magnesium Oxide 250 MG TABS Take 500 mg by mouth daily.    [provider]  Multiple Vitamin (MULTIVITAMIN) tablet Take 1 tablet by mouth daily.    [provider]  rosuvastatin (CRESTOR) 20 MG tablet Take 20 mg by mouth daily.  03/17/18   [provider]    Family History Family History  Problem Relation Age of Onset  . Stroke Father        died @ 19.  Marland Kitchen Heart attack Mother        died @ 63  . Stroke Brother        died in his 106's  . Heart attack Brother        died in his 27's  . Colon cancer Neg Hx   . Esophageal cancer Neg Hx   . Rectal cancer Neg Hx   . Stomach cancer Neg Hx   . Prostate cancer Neg Hx     Social History Social History   Tobacco Use  . Smoking status: Former Smoker    Packs/day: 2.00    Years: 22.00    Pack years: 44.00    Types: Cigarettes    Last attempt to quit: 11/18/1977    Years since quitting: 40.9  . Smokeless tobacco: Never Used  Substance Use Topics  . Alcohol use: No    Alcohol/week: 2.0 standard drinks    Types: 1 Shots of liquor, 1 Standard drinks or equivalent per week    Comment: None currently since previous December.   . Drug use: No     Allergies   Patient has no known allergies.   Review of Systems Review of Systems  Respiratory: Negative for shortness of breath.   Neurological: Positive for weakness.  All other systems reviewed and are negative.    Physical Exam Updated Vital Signs BP (!) 143/88 (BP Location: Right Arm)   Pulse 90   Temp 98.5 F (36.9 C)   Resp 18   Ht 5\' 9"  (1.753 m)   Wt 68.4 kg   SpO2 97%   BMI 22.27 kg/m   Physical Exam  Constitutional: He is oriented to person, place, and time. He appears well-developed and well-nourished. No distress.  HENT:  Head: Normocephalic and atraumatic.  Mouth/Throat: Oropharynx is clear and moist.  Neck: Normal range of motion. Neck supple.  Cardiovascular: Normal rate and regular rhythm. Exam reveals no friction rub.  No murmur heard. Pulmonary/Chest: Effort normal and breath sounds normal. No respiratory distress. He has no wheezes. He has no rales.  Abdominal: Soft. Bowel sounds are normal. He exhibits no distension. There is no tenderness.  Musculoskeletal: Normal range of motion. He  exhibits no edema.  Neurological: He is alert and oriented to person, place, and time. Coordination normal.  Skin: Skin is warm and dry. He is not diaphoretic.  Nursing note and vitals reviewed.    ED Treatments / Results  Labs (all labs ordered are listed, but only abnormal results are displayed) Labs Reviewed -  No data to display  EKG None  Radiology No results found.  Procedures Procedures (including critical care time)  Medications Ordered in ED Medications  sodium chloride 0.9 % bolus 500 mL (has no administration in time range)     Initial Impression / Assessment and Plan / ED Course  I have reviewed the triage vital signs and the nursing notes.  Pertinent labs & imaging results that were available during my care of the patient were reviewed by me and considered in my medical decision making (see chart for details).  Patient presents here with complaints of weakness and trouble with his balance.  His medical work-up has been unremarkable.  I suspect some of this is related to advanced age, and it also appears as though he consumes alcohol regularly.  Patient was evaluated by case management and arrangements for home health are in the works.  After that, he should be fine for discharge.  Final Clinical Impressions(s) / ED Diagnoses   Final diagnoses:  None    ED Discharge Orders    None       Veryl Speak, MD 10/26/18 1616

## 2018-10-26 NOTE — Discharge Instructions (Addendum)
Home health as arranged this afternoon.  Continue medications as previously prescribed.  Return to the emergency department for any new and/or concerning symptoms.

## 2018-10-26 NOTE — Care Management Note (Addendum)
Case Management Note  Patient Details  Name: Victor Clarke MRN: [id number] Date of Birth: 14-Apr-1929  CM consulted for transtion of care planning.  Per Dr. Stark Jock, pt has previously lived in an ALF, does not want to return to one, but also does not feel safe being at home alone.  CM spoke with pt at bedside.  He reports he has used [facility] in the past and would like to use them again.  CM also discussed ALF's and PCS.  CM discussed financial status.  He reports he gets a small pension from the [place] and feels that he has saved pretty well.  He was NOT an active combat vet so he does not qualify for the aid and attendance program.  Pt reports he has had PCS in the past and would like to initiate PCS again with [facility], at least for the short term.  CM contacted Georgina Snell with [facility] who said he would have Ryan with [facility] PCS contact the pt in the ED room and would likely be able to set pt up with a NA today.  CM updated pt, Ash;ey, RN, Dr. Stark Jock, and ED secretary.  16:20 CM was updated by Georgina Snell with [facility] that the intake RN for PCS would be at pt's bedside in the next 10 minutes to start PCS services.  And that the [facility] intake RN would start pt tomorrow morning.  Updated Dr. Stark Jock that pt can be transported home via [name] after intake RN meets with pt.  Meyer Cory, RN.  16:45 Asencion Partridge, RN with [facility] requested to speak with CM at bedside.  She was concerned about PCS being the only team members involved in his upcoming care.  CM advised that pt was initiating with [facility] first thing in the morning, all services.  Discussed DME; pt requesting a shower chair. CM advised that pt would not be able to take it by [name] today and that [facility] can assist with all DME needs.  Also discussed POA.  Pt does not know if he has one.  He reports that both his brother and niece need to be contacted in case of emergency.  CM contacted Georgina Snell with [facility] to advised of pt's CSW needs for POA and family involvement,  SLP cognitive evaluations related to decision making and driving safety, and DME needs.  Updated Dr. Ellender Hose and Caryl Pina, RN.  No further CM needs noted at this time.  Expected Discharge Date:   10/26/2018               Expected Discharge Plan:  [place]  Discharge planning Services  CM Consult  Post Acute Care Choice:  Home Health Choice offered to:  Patient  HH Arranged:  RN, PT, OT, Nurse's Aide, Speech Therapy, Social Work, [employer] HH Agency:  [place]  Status of Service:  Completed, signed off  Lazlo Tunney, Benjaman Lobe, RN 10/26/2018, 1:47 PM

## 2018-10-26 NOTE — ED Triage Notes (Addendum)
Per [facility]- Pt resides at home. FULL CODE. C/o generalized weakness x 3 weeks. Increased today unable to ambulate without the feeling of falling. Denies N/V/D or fever. NEG FOR STROKE. Neuro intact. HX of Parkinson's. Tremors affect is present. Generalized rash and currently being treated. Currently to see Derm otologist today however here for evaluation for weakness. Pt has not been eating well.

## 2018-10-26 NOTE — ED Notes (Signed)
NO RX GIVEN 

## 2018-10-26 NOTE — ED Notes (Signed)
Pt is drinking water.  He denies any complaints at this time.

## 2018-10-26 NOTE — ED Notes (Signed)
PT STATES HE WAS HERE TWO DAYS AGO. PT ADMITS TO DRINK ETOH DAILY 2-3 MARTINI HOWEVER HAS NOT DRANK IN A DAY. HE STATES HE HAS RUN OUT OF ETOH. PT STATES HE BEGAN TO FEEL " THE SHAKES MORE TODAY" ADMITS HE HAS NEVER HAD WITHDRAWS.

## 2018-10-26 NOTE — ED Notes (Signed)
SOUP, CRACKERS AND WATER GIVEN. PT AWARE [name] HAS BEEN CALLED AND THERE IS A WAITING PROCESS.

## 2018-11-18 DIAGNOSIS — R296 Repeated falls: Secondary | ICD-10-CM

## 2018-11-18 HISTORY — DX: Repeated falls: R29.6

## 2018-11-19 ENCOUNTER — Emergency Department (HOSPITAL_COMMUNITY): Payer: Medicare Other

## 2018-11-19 ENCOUNTER — Emergency Department (HOSPITAL_COMMUNITY)
Admission: EM | Admit: 2018-11-19 | Discharge: 2018-11-20 | Disposition: A | Discharge status: Home / Self Care | Payer: Medicare Other | Attending: Emergency Medicine | Admitting: Emergency Medicine

## 2018-11-19 ENCOUNTER — Other Ambulatory Visit: Payer: Self-pay

## 2018-11-19 DIAGNOSIS — W19XXXA Unspecified fall, initial encounter: Secondary | ICD-10-CM

## 2018-11-19 DIAGNOSIS — G20 Parkinson's disease: Secondary | ICD-10-CM | POA: Insufficient documentation

## 2018-11-19 DIAGNOSIS — I10 Essential (primary) hypertension: Secondary | ICD-10-CM | POA: Insufficient documentation

## 2018-11-19 DIAGNOSIS — Z8546 Personal history of malignant neoplasm of prostate: Secondary | ICD-10-CM | POA: Diagnosis not present

## 2018-11-19 DIAGNOSIS — J449 Chronic obstructive pulmonary disease, unspecified: Secondary | ICD-10-CM | POA: Diagnosis not present

## 2018-11-19 DIAGNOSIS — R42 Dizziness and giddiness: Secondary | ICD-10-CM | POA: Insufficient documentation

## 2018-11-19 DIAGNOSIS — Z7982 Long term (current) use of aspirin: Secondary | ICD-10-CM | POA: Insufficient documentation

## 2018-11-19 DIAGNOSIS — Z79899 Other long term (current) drug therapy: Secondary | ICD-10-CM | POA: Insufficient documentation

## 2018-11-19 DIAGNOSIS — Z87891 Personal history of nicotine dependence: Secondary | ICD-10-CM | POA: Diagnosis not present

## 2018-11-19 LAB — BASIC METABOLIC PANEL
ANION GAP: 14 (ref 5–15)
BUN: 28 mg/dL — ABNORMAL HIGH (ref 8–23)
CO2: 24 mmol/L (ref 22–32)
Calcium: 9.3 mg/dL (ref 8.9–10.3)
Chloride: 99 mmol/L (ref 98–111)
Creatinine, Ser: 1.73 mg/dL — ABNORMAL HIGH (ref 0.61–1.24)
GFR calc Af Amer: 40 mL/min — ABNORMAL LOW (ref >60)
GFR calc non Af Amer: 35 mL/min — ABNORMAL LOW (ref >60)
Glucose, Bld: 145 mg/dL — ABNORMAL HIGH (ref 70–99)
Potassium: 4.6 mmol/L (ref 3.5–5.1)
Sodium: 137 mmol/L (ref 135–145)

## 2018-11-19 LAB — CBC
HCT: 37.3 % — ABNORMAL LOW (ref 39.0–52.0)
HEMOGLOBIN: 12.8 g/dL — AB (ref 13.0–17.0)
MCH: 36.2 pg — ABNORMAL HIGH (ref 26.0–34.0)
MCHC: 34.3 g/dL (ref 30.0–36.0)
MCV: 105.4 fL — ABNORMAL HIGH (ref 80.0–100.0)
Platelets: 139 10*3/uL — ABNORMAL LOW (ref 150–400)
RBC: 3.54 MIL/uL — AB (ref 4.22–5.81)
RDW: 12.8 % (ref 11.5–15.5)
WBC: 4.3 10*3/uL (ref 4.0–10.5)
nRBC: 0 % (ref 0.0–0.2)

## 2018-11-19 LAB — I-STAT TROPONIN, ED: Troponin i, poc: 0.03 ng/mL (ref 0.00–0.08)

## 2018-11-19 MED ORDER — SODIUM CHLORIDE 0.9 % IV BOLUS
500.0000 mL | Freq: Once | INTRAVENOUS | Status: AC
  Administered 2018-11-19: 500 mL via INTRAVENOUS

## 2018-11-19 NOTE — ED Triage Notes (Signed)
Patient presents to the ED via EMS with C/O frequent falls and dizziness. From home alone.  Lives in assisted living.   Last fall was today.  Ground level fall landing on a rug.  Patient denies injury

## 2018-11-19 NOTE — ED Notes (Signed)
Pt transported to XR via stretcher. 

## 2018-11-19 NOTE — ED Provider Notes (Signed)
[facility] EMERGENCY DEPARTMENT Provider Note   CSN: [id number] Arrival date & time: 11/19/18  2016     History   Chief Complaint Chief Complaint  Patient presents with  . Fall  . Dizziness    HPI ALBERT HERSCH is a 85 y.o. male.  ARMEND HOCHSTATTER is a 85 y.o. male with a history of COPD, hypertension, hyperlipidemia, Parkinson's and alcohol dependence, who presents to the emergency department for evaluation of frequent falls and lightheadedness.  Per EMS patient reported falling to the ground and landing on a rug earlier today.  He reports he just felt lightheaded and then fell out.  He is unsure if he hit his head.  Reports some very mild low back pain with no numbness tingling or weakness in extremities.  Denies vision changes.  Reports multiple similar falls over the past month, although denies sustaining any significant injury from these falls.  He denies any associated chest pain prior to fall.  He has had some intermittent dyspnea with exertion.  No cough, fevers or chills.  He denies any abdominal pain, nausea or vomiting, no diarrhea.  Patient denies any urinary symptoms.  Patient he lives dependently at an assisted nursing facility, and he has a home health evaluation set up tomorrow at noon.     Past Medical History:  Diagnosis Date  . Alcoholic peripheral neuropathy ([place])   . Allergy   . Anemia   . Arthritis   . Cataract    left eye cataract removed  . COPD (chronic obstructive pulmonary disease) ([place])    pt denies 02-03-17  . Dehydration   . Detached retina    a. s/p surgical correction on the right.  . Ectopic atrial tachycardia ([place])    a. 08/2014 Echo: EF 55-60%, mildly dil LA.  Marland Kitchen EtOH dependence ([place])   . Fall   . Gout   . Gout   . Hypercholesteremia   . Hypertension   . Left inguinal hernia    a. s/p repair ~ 10 yrs ago.  . Malnutrition ([place])   . Orthostatic hypotension   . Parkinson's disease ([place])   . Prostate cancer ([place])    a. s/p TURP.    Patient Active Problem List   Diagnosis Date Noted  . First degree AV block   . Hypotension due to hypovolemia   . Syncope 06/18/2018  . Acute renal failure superimposed on stage 3 chronic kidney disease ([place]) 06/18/2018  . Leukopenia 06/18/2018  . AV block, [place] II 06/18/2018  . Memory loss 10/22/2016  . Malnutrition of moderate degree 11/15/2015  . Fall from steps 11/07/2015  . Anemia 11/07/2015  . Alcohol dependence ([place]) 11/07/2015  . Pyuria 11/07/2015  . Dehydration 08/08/2015  . UTI (urinary tract infection) 08/08/2015  . High anion gap metabolic acidosis 26/33/3545  . Dizziness 07/31/2015  . Orthostatic hypotension 07/31/2015  . Lactic acidosis 07/31/2015  . Ectopic atrial tachycardia ([place])   . Hypercholesteremia   . Dyspnea on exertion 09/16/2014  . Hypertension 09/16/2014    Past Surgical History:  Procedure Laterality Date  . COLONOSCOPY    . COLONOSCOPY W/ POLYPECTOMY  2001, 2006  . HERNIA REPAIR    . PROSTATE SURGERY          Home Medications    Prior to Admission medications   Medication Sig Start Date End Date Taking? Authorizing Provider  aspirin EC 81 MG tablet Take 81 mg by mouth every Monday, Wednesday, and Friday.  [provider]  Cholecalciferol (VITAMIN D3) 5000 units CAPS Take 10,000 Units by mouth daily.    [provider]  Dermatological Products, [place]. (LEVICYN) GEL Apply 1 application topically daily.    [provider]  feeding supplement, ENSURE ENLIVE, (ENSURE ENLIVE) LIQD Take 237 mLs by mouth 3 (three) times daily between meals. Patient taking differently: Take 237 mLs by mouth 2 (two) times daily.  08/08/15   Kelvin Cellar, MD  folic acid (FOLVITE) 882 MCG tablet Take 800 mcg by mouth daily.    [provider]  Magnesium Oxide 250 MG TABS Take 500 mg by mouth daily.    [provider]  Multiple Vitamin (MULTIVITAMIN) tablet Take 1 tablet by mouth daily.    [provider]  rosuvastatin (CRESTOR) 20 MG tablet Take 20 mg by mouth daily.  03/17/18   [provider]    Family History Family History  Problem Relation Age of Onset  . Stroke Father        died @ 46.  Marland Kitchen Heart attack Mother        died @ 4  . Stroke Brother        died in his 34's  . Heart attack Brother        died in his 85's  . Colon cancer Neg Hx   . Esophageal cancer Neg Hx   . Rectal cancer Neg Hx   . Stomach cancer Neg Hx   . Prostate cancer Neg Hx     Social History Social History   Tobacco Use  . Smoking status: Former Smoker    Packs/day: 2.00    Years: 22.00    Pack years: 44.00    Types: Cigarettes    Last attempt to quit: 11/18/1977    Years since quitting: 41.0  . Smokeless tobacco: Never Used  Substance Use Topics  . Alcohol use: No    Alcohol/week: 2.0 standard drinks    Types: 1 Shots of liquor, 1 Standard drinks or equivalent per week    Comment: None currently since previous December.   . Drug use: No     Allergies   Patient has no known allergies.   Review of Systems Review of Systems  Constitutional: Negative for chills and fever.  HENT: Negative for congestion, rhinorrhea and sore throat.   Eyes: Negative for visual disturbance.  Respiratory: Negative for cough and shortness of breath.   Cardiovascular: Negative for chest pain.  Gastrointestinal: Negative for abdominal pain, nausea and vomiting.  Genitourinary: Negative for dysuria and frequency.  Musculoskeletal: Positive for back pain. Negative for arthralgias and myalgias.  Skin: Negative for color change and rash.  Neurological: Positive for dizziness, syncope and light-headedness. Negative for weakness, numbness and headaches.     Physical Exam Updated Vital Signs BP (!) 148/113 (BP Location: Right Arm)   Pulse 83   Temp 97.9 F (36.6 C) (Oral)   Resp 19   SpO2 100%   Physical Exam Vitals signs and nursing note reviewed.  Constitutional:      General: He  is not in acute distress.    Appearance: He is well-developed. He is not diaphoretic.  HENT:     Head: Normocephalic and atraumatic.     Mouth/Throat:     Mouth: Mucous membranes are moist.     Pharynx: Oropharynx is clear.  Eyes:     General:        Right eye: No discharge.  Left eye: No discharge.     Extraocular Movements: Extraocular movements intact.     Pupils: Pupils are equal, round, and reactive to light.  Neck:     Musculoskeletal: Normal range of motion and neck supple. No muscular tenderness.     Comments: No posterior C-spine tenderness Cardiovascular:     Rate and Rhythm: Normal rate and regular rhythm.     Pulses: Normal pulses.          Radial pulses are 2+ on the right side and 2+ on the left side.       Dorsalis pedis pulses are 2+ on the right side and 2+ on the left side.     Heart sounds: Normal heart sounds. No murmur. No friction rub. No gallop.   Pulmonary:     Effort: Pulmonary effort is normal. No respiratory distress.     Breath sounds: Normal breath sounds.     Comments: Respirations equal and unlabored, patient able to speak in full sentences, lungs clear to auscultation bilaterally Abdominal:     General: Abdomen is flat. Bowel sounds are normal. There is no distension.     Palpations: Abdomen is soft. There is no mass.     Tenderness: There is no abdominal tenderness. There is no guarding.     Comments: Abdomen soft, nondistended, nontender to palpation in all quadrants without guarding or peritoneal signs  Musculoskeletal:     Right lower leg: No edema.     Left lower leg: No edema.  Skin:    General: Skin is warm and dry.     Capillary Refill: Capillary refill takes less than 2 seconds.  Neurological:     Mental Status: He is alert and oriented to person, place, and time. Mental status is at baseline.     Coordination: Coordination normal.     Comments: Speech is clear, able to follow commands CN III-XII intact Normal strength in upper  and lower extremities bilaterally including dorsiflexion and plantar flexion, strong and equal grip strength Sensation normal to light and sharp touch Moves extremities without ataxia, coordination intact  Psychiatric:        Mood and Affect: Mood normal.        Behavior: Behavior normal.      ED Treatments / Results  Labs (all labs ordered are listed, but only abnormal results are displayed) Labs Reviewed  BASIC METABOLIC PANEL - Abnormal; Notable for the following components:      Result Value   Glucose, Bld 145 (*)    BUN 28 (*)    Creatinine, Ser 1.73 (*)    GFR calc non Af Amer 35 (*)    GFR calc Af Amer 40 (*)    All other components within normal limits  CBC - Abnormal; Notable for the following components:   RBC 3.54 (*)    Hemoglobin 12.8 (*)    HCT 37.3 (*)    MCV 105.4 (*)    MCH 36.2 (*)    Platelets 139 (*)    All other components within normal limits  URINALYSIS, ROUTINE W REFLEX MICROSCOPIC  I-STAT TROPONIN, ED    EKG EKG Interpretation  Date/Time:  Thursday November 19 2018 20:17:37 EST Ventricular Rate:  92 PR Interval:    QRS Duration: 106 QT Interval:  367 QTC Calculation: 454 R Axis:   -59 Text Interpretation:  Sinus rhythm Multiple ventricular premature complexes Prolonged PR interval Left anterior fascicular block Abnormal R-wave progression, late transition Confirmed by Malvin Johns (  [id number]) on 11/19/2018 8:21:12 PM   Radiology Dg Chest 2 View  Result Date: 11/19/2018 CLINICAL DATA:  Short of breath EXAM: CHEST - 2 VIEW COMPARISON:  09/26/2018 FINDINGS: No acute opacity or pleural effusion. Borderline to mild cardiomegaly. Aortic atherosclerosis. No pneumothorax. Mild reticular changes at the bases suggesting fibrosis. IMPRESSION: No active cardiopulmonary disease. Electronically Signed   By: Donavan Foil M.D.   On: 11/19/2018 21:42   Dg Lumbar Spine Complete  Result Date: 11/19/2018 CLINICAL DATA:  Fall with low back pain EXAM: LUMBAR SPINE -  COMPLETE 4+ VIEW COMPARISON:  None. FINDINGS: Lumbar alignment is within normal limits. Moderate degenerative change at L3-L4 with mild degenerative change at L1-L2 and L2-L3. Dense aortic atherosclerosis. Surgical changes in the pelvis. Mild scoliosis IMPRESSION: Mild scoliosis and moderate degenerative changes. No acute osseous abnormality. Electronically Signed   By: Donavan Foil M.D.   On: 11/19/2018 21:43   Ct Head Wo Contrast  Result Date: 11/19/2018 CLINICAL DATA:  Status post multiple falls. Dizziness. Concern for head or cervical spine injury. Initial encounter. EXAM: CT HEAD WITHOUT CONTRAST CT CERVICAL SPINE WITHOUT CONTRAST TECHNIQUE: Multidetector CT imaging of the head and cervical spine was performed following the standard protocol without intravenous contrast. Multiplanar CT image reconstructions of the cervical spine were also generated. COMPARISON:  CT of the head performed 09/18/2018, and cervical spine radiographs performed 04/09/2013 FINDINGS: CT HEAD FINDINGS Brain: No evidence of acute infarction, hemorrhage, hydrocephalus, extra-axial collection or mass lesion / mass effect. Prominence of the ventricles and sulci reflects moderate cortical volume loss. Mild cerebellar atrophy is noted. Scattered periventricular and subcortical white matter change likely reflects small vessel ischemic microangiopathy. The brainstem and fourth ventricle are within normal limits. The basal ganglia are unremarkable in appearance. The cerebral hemispheres demonstrate grossly normal gray-white differentiation. No mass effect or midline shift is seen. Vascular: No hyperdense vessel or unexpected calcification. Skull: There is no evidence of fracture; visualized osseous structures are unremarkable in appearance. Sinuses/Orbits: The orbits are within normal limits. The paranasal sinuses and mastoid air cells are well-aerated. Other: No significant soft tissue abnormalities are seen. CT CERVICAL SPINE FINDINGS  Alignment: Normal. Skull base and vertebrae: No acute fracture. No primary bone lesion or focal pathologic process. Soft tissues and spinal canal: No prevertebral fluid or swelling. No visible canal hematoma. Disc levels: Multilevel disc space narrowing is noted throughout the cervical spine, with scattered anterior and posterior disc osteophyte complexes, and underlying facet disease. Upper chest: Emphysema is noted at the lung apices. The thyroid gland is unremarkable in appearance. Scattered calcification is seen at the carotid bifurcations bilaterally. Other: No additional soft tissue abnormalities are seen. IMPRESSION: 1. No evidence of traumatic intracranial injury or fracture. 2. No evidence of fracture or subluxation along the cervical spine. 3. Moderate cortical volume loss and scattered small vessel ischemic microangiopathy. 4. Mild degenerative change throughout the cervical spine. 5. Emphysema at the lung apices. 6. Scattered calcification at the carotid bifurcations bilaterally. Carotid ultrasound would be helpful for further evaluation, when and as deemed clinically appropriate. Emphysema (ICD10-J43.9). Electronically Signed   By: Garald Balding M.D.   On: 11/19/2018 21:18   Ct Cervical Spine Wo Contrast  Result Date: 11/19/2018 CLINICAL DATA:  Status post multiple falls. Dizziness. Concern for head or cervical spine injury. Initial encounter. EXAM: CT HEAD WITHOUT CONTRAST CT CERVICAL SPINE WITHOUT CONTRAST TECHNIQUE: Multidetector CT imaging of the head and cervical spine was performed following the standard protocol without intravenous contrast. Multiplanar CT image  reconstructions of the cervical spine were also generated. COMPARISON:  CT of the head performed 09/18/2018, and cervical spine radiographs performed 04/09/2013 FINDINGS: CT HEAD FINDINGS Brain: No evidence of acute infarction, hemorrhage, hydrocephalus, extra-axial collection or mass lesion / mass effect. Prominence of the ventricles  and sulci reflects moderate cortical volume loss. Mild cerebellar atrophy is noted. Scattered periventricular and subcortical white matter change likely reflects small vessel ischemic microangiopathy. The brainstem and fourth ventricle are within normal limits. The basal ganglia are unremarkable in appearance. The cerebral hemispheres demonstrate grossly normal gray-white differentiation. No mass effect or midline shift is seen. Vascular: No hyperdense vessel or unexpected calcification. Skull: There is no evidence of fracture; visualized osseous structures are unremarkable in appearance. Sinuses/Orbits: The orbits are within normal limits. The paranasal sinuses and mastoid air cells are well-aerated. Other: No significant soft tissue abnormalities are seen. CT CERVICAL SPINE FINDINGS Alignment: Normal. Skull base and vertebrae: No acute fracture. No primary bone lesion or focal pathologic process. Soft tissues and spinal canal: No prevertebral fluid or swelling. No visible canal hematoma. Disc levels: Multilevel disc space narrowing is noted throughout the cervical spine, with scattered anterior and posterior disc osteophyte complexes, and underlying facet disease. Upper chest: Emphysema is noted at the lung apices. The thyroid gland is unremarkable in appearance. Scattered calcification is seen at the carotid bifurcations bilaterally. Other: No additional soft tissue abnormalities are seen. IMPRESSION: 1. No evidence of traumatic intracranial injury or fracture. 2. No evidence of fracture or subluxation along the cervical spine. 3. Moderate cortical volume loss and scattered small vessel ischemic microangiopathy. 4. Mild degenerative change throughout the cervical spine. 5. Emphysema at the lung apices. 6. Scattered calcification at the carotid bifurcations bilaterally. Carotid ultrasound would be helpful for further evaluation, when and as deemed clinically appropriate. Emphysema (ICD10-J43.9). Electronically  Signed   By: Garald Balding M.D.   On: 11/19/2018 21:18    Procedures Procedures (including critical care time)  Medications Ordered in ED Medications  sodium chloride 0.9 % bolus 500 mL (500 mLs Intravenous New Bag/Given 11/19/18 2306)  sodium chloride 0.9 % bolus 500 mL (0 mLs Intravenous Stopped 11/19/18 2312)     Initial Impression / Assessment and Plan / ED Course  I have reviewed the triage vital signs and the nursing notes.  Pertinent labs & imaging results that were available during my care of the patient were reviewed by me and considered in my medical decision making (see chart for details).  Patient presents via EMS for evaluation of frequent falls.  Patient has been seen for similar multiple times recently.  Reports he starts to feel dizzy and lightheaded and then just falls down.  Today he fell landing on a padded rug, unsure if he hit his head, reports some mild low back pain but no other focal pain or injury from the fall.  He denies any chest pain.  Does report some intermittent dyspnea with exertion.  No fevers or chills, no abdominal pain, nausea vomiting or urinary symptoms.  On arrival he is mildly hypertensive but vitals otherwise stable.  Labs overall reassuring, no leukocytosis, stable hemoglobin, creatinine is slightly elevated from baseline of 1.0-1.73, this is likely in the setting of dehydration, given to 500 cc fluid boluses, no other acute electrolyte derangements.  EKG without concerning ischemic changes and negative troponin.  Chest x-ray shows no active cardiopulmonary disease, lumbar films show slight degenerative changes but no acute fracture from fall.  CT of the head and C-spine overall  reassuring with no evidence of acute intracranial injury or traumatic fracture or malalignment.  Case discussed with Mariann Laster with case management, who will follow-up on patient's scheduled home health appointment for tomorrow at ensure that patient has all necessary resources for  home health.  At shift change care signed out to PA Quincy Carnes who will follow-up on patient's urinalysis and how patient does ambulating in the department.  Patient is ambulatory without difficulty feel the patient will be stable for discharge back to his living facility, especially knowing that he will have home health care consult tomorrow morning.  Patient discussed with Dr. Tamera Punt, who saw patient as well and agrees with plan.   Final Clinical Impressions(s) / ED Diagnoses   Final diagnoses:  Fall, initial encounter  [place]    ED Discharge Orders    None       Jacqlyn Larsen, [place] 11/19/18 2358    Malvin Johns, MD 11/20/18 [phone]

## 2018-11-20 NOTE — ED Notes (Signed)
Called ptar for pt, Victor Clarke  

## 2018-11-20 NOTE — ED Notes (Signed)
  Patient ambulated with assistance in the hallway.  Patient had no dizziness or balance issues.    Patient was unable to void at this time.  Will call out when he has to urinate.

## 2018-11-20 NOTE — ED Provider Notes (Signed)
Assumed care from [place] at shift change.  See prior notes for full H&P.  85 year old male here with multiple recent falls.  Currently from assisted living.  Reportedly he tripped over a rug today.  He does not have any apparent injuries today.  Labs thus far reassuring.  Plan: UA pending.  Will need to ambulate here.  Has evaluation scheduled for noon tomorrow for home health services.  Results for orders placed or performed during the hospital encounter of 34/19/62  Basic metabolic panel  Result Value Ref Range   Sodium 137 135 - 145 mmol/L   Potassium 4.6 3.5 - 5.1 mmol/L   Chloride 99 98 - 111 mmol/L   CO2 24 22 - 32 mmol/L   Glucose, Bld 145 (H) 70 - 99 mg/dL   BUN 28 (H) 8 - 23 mg/dL   Creatinine, Ser 1.73 (H) 0.61 - 1.24 mg/dL   Calcium 9.3 8.9 - 10.3 mg/dL   GFR calc non Af Amer 35 (L) >60 mL/min   GFR calc Af Amer 40 (L) >60 mL/min   Anion gap 14 5 - 15  CBC  Result Value Ref Range   WBC 4.3 4.0 - 10.5 K/uL   RBC 3.54 (L) 4.22 - 5.81 MIL/uL   Hemoglobin 12.8 (L) 13.0 - 17.0 g/dL   HCT 37.3 (L) 39.0 - 52.0 %   MCV 105.4 (H) 80.0 - 100.0 fL   MCH 36.2 (H) 26.0 - 34.0 pg   MCHC 34.3 30.0 - 36.0 g/dL   RDW 12.8 11.5 - 15.5 %   Platelets 139 (L) 150 - 400 K/uL   nRBC 0.0 0.0 - 0.2 %  I-stat troponin, ED  Result Value Ref Range   Troponin i, poc 0.03 0.00 - 0.08 ng/mL   Comment 3           Dg Chest 2 View  Result Date: 11/19/2018 CLINICAL DATA:  Short of breath EXAM: CHEST - 2 VIEW COMPARISON:  09/26/2018 FINDINGS: No acute opacity or pleural effusion. Borderline to mild cardiomegaly. Aortic atherosclerosis. No pneumothorax. Mild reticular changes at the bases suggesting fibrosis. IMPRESSION: No active cardiopulmonary disease. Electronically Signed   By: Donavan Foil M.D.   On: 11/19/2018 21:42   Dg Lumbar Spine Complete  Result Date: 11/19/2018 CLINICAL DATA:  Fall with low back pain EXAM: LUMBAR SPINE - COMPLETE 4+ VIEW COMPARISON:  None. FINDINGS: Lumbar alignment is  within normal limits. Moderate degenerative change at L3-L4 with mild degenerative change at L1-L2 and L2-L3. Dense aortic atherosclerosis. Surgical changes in the pelvis. Mild scoliosis IMPRESSION: Mild scoliosis and moderate degenerative changes. No acute osseous abnormality. Electronically Signed   By: Donavan Foil M.D.   On: 11/19/2018 21:43   Ct Head Wo Contrast  Result Date: 11/19/2018 CLINICAL DATA:  Status post multiple falls. Dizziness. Concern for head or cervical spine injury. Initial encounter. EXAM: CT HEAD WITHOUT CONTRAST CT CERVICAL SPINE WITHOUT CONTRAST TECHNIQUE: Multidetector CT imaging of the head and cervical spine was performed following the standard protocol without intravenous contrast. Multiplanar CT image reconstructions of the cervical spine were also generated. COMPARISON:  CT of the head performed 09/18/2018, and cervical spine radiographs performed 04/09/2013 FINDINGS: CT HEAD FINDINGS Brain: No evidence of acute infarction, hemorrhage, hydrocephalus, extra-axial collection or mass lesion / mass effect. Prominence of the ventricles and sulci reflects moderate cortical volume loss. Mild cerebellar atrophy is noted. Scattered periventricular and subcortical white matter change likely reflects small vessel ischemic microangiopathy. The brainstem and fourth ventricle  are within normal limits. The basal ganglia are unremarkable in appearance. The cerebral hemispheres demonstrate grossly normal gray-white differentiation. No mass effect or midline shift is seen. Vascular: No hyperdense vessel or unexpected calcification. Skull: There is no evidence of fracture; visualized osseous structures are unremarkable in appearance. Sinuses/Orbits: The orbits are within normal limits. The paranasal sinuses and mastoid air cells are well-aerated. Other: No significant soft tissue abnormalities are seen. CT CERVICAL SPINE FINDINGS Alignment: Normal. Skull base and vertebrae: No acute fracture. No  primary bone lesion or focal pathologic process. Soft tissues and spinal canal: No prevertebral fluid or swelling. No visible canal hematoma. Disc levels: Multilevel disc space narrowing is noted throughout the cervical spine, with scattered anterior and posterior disc osteophyte complexes, and underlying facet disease. Upper chest: Emphysema is noted at the lung apices. The thyroid gland is unremarkable in appearance. Scattered calcification is seen at the carotid bifurcations bilaterally. Other: No additional soft tissue abnormalities are seen. IMPRESSION: 1. No evidence of traumatic intracranial injury or fracture. 2. No evidence of fracture or subluxation along the cervical spine. 3. Moderate cortical volume loss and scattered small vessel ischemic microangiopathy. 4. Mild degenerative change throughout the cervical spine. 5. Emphysema at the lung apices. 6. Scattered calcification at the carotid bifurcations bilaterally. Carotid ultrasound would be helpful for further evaluation, when and as deemed clinically appropriate. Emphysema (ICD10-J43.9). Electronically Signed   By: Garald Balding M.D.   On: 11/19/2018 21:18   Ct Cervical Spine Wo Contrast  Result Date: 11/19/2018 CLINICAL DATA:  Status post multiple falls. Dizziness. Concern for head or cervical spine injury. Initial encounter. EXAM: CT HEAD WITHOUT CONTRAST CT CERVICAL SPINE WITHOUT CONTRAST TECHNIQUE: Multidetector CT imaging of the head and cervical spine was performed following the standard protocol without intravenous contrast. Multiplanar CT image reconstructions of the cervical spine were also generated. COMPARISON:  CT of the head performed 09/18/2018, and cervical spine radiographs performed 04/09/2013 FINDINGS: CT HEAD FINDINGS Brain: No evidence of acute infarction, hemorrhage, hydrocephalus, extra-axial collection or mass lesion / mass effect. Prominence of the ventricles and sulci reflects moderate cortical volume loss. Mild cerebellar  atrophy is noted. Scattered periventricular and subcortical white matter change likely reflects small vessel ischemic microangiopathy. The brainstem and fourth ventricle are within normal limits. The basal ganglia are unremarkable in appearance. The cerebral hemispheres demonstrate grossly normal gray-white differentiation. No mass effect or midline shift is seen. Vascular: No hyperdense vessel or unexpected calcification. Skull: There is no evidence of fracture; visualized osseous structures are unremarkable in appearance. Sinuses/Orbits: The orbits are within normal limits. The paranasal sinuses and mastoid air cells are well-aerated. Other: No significant soft tissue abnormalities are seen. CT CERVICAL SPINE FINDINGS Alignment: Normal. Skull base and vertebrae: No acute fracture. No primary bone lesion or focal pathologic process. Soft tissues and spinal canal: No prevertebral fluid or swelling. No visible canal hematoma. Disc levels: Multilevel disc space narrowing is noted throughout the cervical spine, with scattered anterior and posterior disc osteophyte complexes, and underlying facet disease. Upper chest: Emphysema is noted at the lung apices. The thyroid gland is unremarkable in appearance. Scattered calcification is seen at the carotid bifurcations bilaterally. Other: No additional soft tissue abnormalities are seen. IMPRESSION: 1. No evidence of traumatic intracranial injury or fracture. 2. No evidence of fracture or subluxation along the cervical spine. 3. Moderate cortical volume loss and scattered small vessel ischemic microangiopathy. 4. Mild degenerative change throughout the cervical spine. 5. Emphysema at the lung apices. 6. Scattered calcification at  the carotid bifurcations bilaterally. Carotid ultrasound would be helpful for further evaluation, when and as deemed clinically appropriate. Emphysema (ICD10-J43.9). Electronically Signed   By: Garald Balding M.D.   On: 11/19/2018 21:18     3:13  AM Several attempts to collect urine over the past few hours including attempt at in and out cath.  Patient has hx of BPH s/p TURP, unable to get return of urine.  He has ambulated here in the ED without issue.  States he feels fine and is ready to go.  Patient has not had any recent UA's performed in his last few ED visits.  He is afebrile here, no leukocytosis, ambulatory with steady gait, VSS without SIRS criteria.  He remains oriented to baseline.  His fall today seems mechanical in nature given he tripped over a rug.  Given unsuccessful attempts here, will d/c back to facility to see if UA can be performed on OP basis.  Will go forward with home health evaluation there.  Discussed with Dr. Leonette Monarch-- agrees this is a reasonable plan.   Larene Pickett, PA-C 11/20/18 [id number]    Fatima Blank, MD 11/20/18 [phone]

## 2018-11-20 NOTE — Discharge Instructions (Addendum)
See if your doctor can get a urine test to check for infection since we were unable to get it here. Our case manager will follow-up on your home health evaluation. Please return here for any new/acute changes.

## 2018-11-21 ENCOUNTER — Encounter: Payer: Self-pay | Admitting: Surgery

## 2018-11-21 NOTE — Progress Notes (Unsigned)
ED CM received CM consult via CM ED Pool. As per EDP patient reported that [facility] should be  Starting [facility] services but patient was unsure of the services or when the start or care was. [facility] orders were placed prior to discharge to ALF from ED, CM sent ED orders via epic to [facility]. CM will follow up tomorrow

## 2018-12-01 ENCOUNTER — Observation Stay (HOSPITAL_COMMUNITY): Payer: Medicare Other

## 2018-12-01 ENCOUNTER — Emergency Department (HOSPITAL_COMMUNITY): Payer: Medicare Other

## 2018-12-01 ENCOUNTER — Inpatient Hospital Stay (HOSPITAL_COMMUNITY)
Admission: EM | Admit: 2018-12-01 | Discharge: 2018-12-11 | DRG: 897 | Disposition: A | Discharge status: Nursing Home (Includes ICF) | Payer: Medicare Other | Attending: Internal Medicine | Admitting: Internal Medicine

## 2018-12-01 ENCOUNTER — Encounter (HOSPITAL_COMMUNITY): Payer: Self-pay | Admitting: Emergency Medicine

## 2018-12-01 DIAGNOSIS — Z87891 Personal history of nicotine dependence: Secondary | ICD-10-CM

## 2018-12-01 DIAGNOSIS — S0181XA Laceration without foreign body of other part of head, initial encounter: Secondary | ICD-10-CM | POA: Diagnosis present

## 2018-12-01 DIAGNOSIS — Y92009 Unspecified place in unspecified non-institutional (private) residence as the place of occurrence of the external cause: Secondary | ICD-10-CM

## 2018-12-01 DIAGNOSIS — Z7289 Other problems related to lifestyle: Secondary | ICD-10-CM

## 2018-12-01 DIAGNOSIS — R270 Ataxia, unspecified: Secondary | ICD-10-CM | POA: Diagnosis not present

## 2018-12-01 DIAGNOSIS — W19XXXA Unspecified fall, initial encounter: Secondary | ICD-10-CM | POA: Diagnosis present

## 2018-12-01 DIAGNOSIS — Z7982 Long term (current) use of aspirin: Secondary | ICD-10-CM

## 2018-12-01 DIAGNOSIS — Z66 Do not resuscitate: Secondary | ICD-10-CM | POA: Diagnosis present

## 2018-12-01 DIAGNOSIS — I10 Essential (primary) hypertension: Secondary | ICD-10-CM | POA: Diagnosis not present

## 2018-12-01 DIAGNOSIS — F10220 Alcohol dependence with intoxication, uncomplicated: Secondary | ICD-10-CM | POA: Diagnosis not present

## 2018-12-01 DIAGNOSIS — Z8249 Family history of ischemic heart disease and other diseases of the circulatory system: Secondary | ICD-10-CM

## 2018-12-01 DIAGNOSIS — S01511A Laceration without foreign body of lip, initial encounter: Secondary | ICD-10-CM | POA: Diagnosis present

## 2018-12-01 DIAGNOSIS — Z823 Family history of stroke: Secondary | ICD-10-CM

## 2018-12-01 DIAGNOSIS — F10239 Alcohol dependence with withdrawal, unspecified: Secondary | ICD-10-CM | POA: Diagnosis present

## 2018-12-01 DIAGNOSIS — F10920 Alcohol use, unspecified with intoxication, uncomplicated: Secondary | ICD-10-CM

## 2018-12-01 DIAGNOSIS — K011 Impacted teeth: Secondary | ICD-10-CM | POA: Insufficient documentation

## 2018-12-01 DIAGNOSIS — R42 Dizziness and giddiness: Secondary | ICD-10-CM

## 2018-12-01 DIAGNOSIS — M2763 Post-osseointegration mechanical failure of dental implant: Secondary | ICD-10-CM

## 2018-12-01 DIAGNOSIS — J449 Chronic obstructive pulmonary disease, unspecified: Secondary | ICD-10-CM | POA: Diagnosis present

## 2018-12-01 DIAGNOSIS — Z23 Encounter for immunization: Secondary | ICD-10-CM

## 2018-12-01 DIAGNOSIS — Y906 Blood alcohol level of 120-199 mg/100 ml: Secondary | ICD-10-CM | POA: Diagnosis present

## 2018-12-01 DIAGNOSIS — E518 Other manifestations of thiamine deficiency: Secondary | ICD-10-CM | POA: Diagnosis present

## 2018-12-01 DIAGNOSIS — Z79899 Other long term (current) drug therapy: Secondary | ICD-10-CM

## 2018-12-01 DIAGNOSIS — R21 Rash and other nonspecific skin eruption: Secondary | ICD-10-CM | POA: Diagnosis not present

## 2018-12-01 DIAGNOSIS — M25511 Pain in right shoulder: Secondary | ICD-10-CM

## 2018-12-01 DIAGNOSIS — Z86010 Personal history of colonic polyps: Secondary | ICD-10-CM

## 2018-12-01 DIAGNOSIS — F1090 Alcohol use, unspecified, uncomplicated: Secondary | ICD-10-CM

## 2018-12-01 HISTORY — DX: Repeated falls: R29.6

## 2018-12-01 LAB — COMPREHENSIVE METABOLIC PANEL
ALT: 46 U/L — AB (ref 0–44)
AST: 70 U/L — ABNORMAL HIGH (ref 15–41)
Albumin: 3.6 g/dL (ref 3.5–5.0)
Alkaline Phosphatase: 41 U/L (ref 38–126)
Anion gap: 16 — ABNORMAL HIGH (ref 5–15)
BUN: 17 mg/dL (ref 8–23)
CHLORIDE: 108 mmol/L (ref 98–111)
CO2: 20 mmol/L — ABNORMAL LOW (ref 22–32)
Calcium: 9.4 mg/dL (ref 8.9–10.3)
Creatinine, Ser: 1.02 mg/dL (ref 0.61–1.24)
GFR calc Af Amer: >60 mL/min (ref >60)
GFR calc non Af Amer: >60 mL/min (ref >60)
Glucose, Bld: 89 mg/dL (ref 70–99)
Potassium: 3.7 mmol/L (ref 3.5–5.1)
Sodium: 144 mmol/L (ref 135–145)
Total Bilirubin: 0.9 mg/dL (ref 0.3–1.2)
Total Protein: 6.4 g/dL — ABNORMAL LOW (ref 6.5–8.1)

## 2018-12-01 LAB — CBC WITH DIFFERENTIAL/PLATELET
Abs Immature Granulocytes: 0.05 10*3/uL (ref 0.00–0.07)
BASOS ABS: 0 10*3/uL (ref 0.0–0.1)
Basophils Relative: 0 %
Eosinophils Absolute: 0 10*3/uL (ref 0.0–0.5)
Eosinophils Relative: 0 %
HCT: 39 % (ref 39.0–52.0)
Hemoglobin: 12.8 g/dL — ABNORMAL LOW (ref 13.0–17.0)
Immature Granulocytes: 1 %
LYMPHS PCT: 13 %
Lymphs Abs: 0.9 10*3/uL (ref 0.7–4.0)
MCH: 35 pg — ABNORMAL HIGH (ref 26.0–34.0)
MCHC: 32.8 g/dL (ref 30.0–36.0)
MCV: 106.6 fL — ABNORMAL HIGH (ref 80.0–100.0)
Monocytes Absolute: 0.6 10*3/uL (ref 0.1–1.0)
Monocytes Relative: 9 %
Neutro Abs: 5 10*3/uL (ref 1.7–7.7)
Neutrophils Relative %: 77 %
Platelets: 156 10*3/uL (ref 150–400)
RBC: 3.66 MIL/uL — ABNORMAL LOW (ref 4.22–5.81)
RDW: 12.6 % (ref 11.5–15.5)
WBC: 6.6 10*3/uL (ref 4.0–10.5)
nRBC: 0 % (ref 0.0–0.2)

## 2018-12-01 LAB — URINALYSIS, ROUTINE W REFLEX MICROSCOPIC
Bacteria, UA: NONE SEEN
Bilirubin Urine: NEGATIVE
Glucose, UA: 50 mg/dL — AB
HGB URINE DIPSTICK: NEGATIVE
Ketones, ur: 5 mg/dL — AB
Leukocytes, UA: NEGATIVE
Nitrite: NEGATIVE
Protein, ur: 30 mg/dL — AB
Specific Gravity, Urine: 1.023 (ref 1.005–1.030)
pH: 6 (ref 5.0–8.0)

## 2018-12-01 LAB — TSH: TSH: 0.587 u[IU]/mL (ref 0.350–4.500)

## 2018-12-01 LAB — HEMOGLOBIN A1C
Hgb A1c MFr Bld: 5.4 % (ref 4.8–5.6)
Mean Plasma Glucose: 108.28 mg/dL

## 2018-12-01 LAB — VITAMIN B12: Vitamin B-12: 363 pg/mL (ref 180–914)

## 2018-12-01 LAB — CK: Total CK: 529 U/L — ABNORMAL HIGH (ref 49–397)

## 2018-12-01 LAB — ETHANOL: ALCOHOL ETHYL (B): 196 mg/dL — AB (ref <10)

## 2018-12-01 LAB — I-STAT TROPONIN, ED: Troponin i, poc: 0.03 ng/mL (ref 0.00–0.08)

## 2018-12-01 MED ORDER — LORAZEPAM 1 MG PO TABS
0.0000 mg | ORAL_TABLET | Freq: Four times a day (QID) | ORAL | Status: AC
  Administered 2018-12-02 (×2): 2 mg via ORAL
  Administered 2018-12-02: 1 mg via ORAL
  Administered 2018-12-03: 2 mg via ORAL
  Filled 2018-12-01 (×3): qty 2

## 2018-12-01 MED ORDER — LORAZEPAM 2 MG/ML IJ SOLN: 0.0000 mg | Freq: Two times a day (BID) | INTRAMUSCULAR | Status: DC

## 2018-12-01 MED ORDER — THIAMINE HCL 100 MG/ML IJ SOLN
250.0000 mg | INTRAVENOUS | Status: AC
  Administered 2018-12-04 – 2018-12-06 (×3): 250 mg via INTRAVENOUS
  Filled 2018-12-01 (×5): qty 2.5

## 2018-12-01 MED ORDER — VITAMIN B-1 100 MG PO TABS
100.0000 mg | ORAL_TABLET | Freq: Every day | ORAL | Status: DC
  Administered 2018-12-01: 100 mg via ORAL
  Filled 2018-12-01: qty 1

## 2018-12-01 MED ORDER — ADULT MULTIVITAMIN W/MINERALS CH
1.0000 | ORAL_TABLET | Freq: Every day | ORAL | Status: DC
  Administered 2018-12-01 – 2018-12-11 (×11): 1 via ORAL
  Filled 2018-12-01 (×11): qty 1

## 2018-12-01 MED ORDER — LORAZEPAM 1 MG PO TABS
0.0000 mg | ORAL_TABLET | Freq: Two times a day (BID) | ORAL | Status: AC
  Administered 2018-12-03 – 2018-12-05 (×2): 2 mg via ORAL
  Filled 2018-12-01 (×3): qty 2

## 2018-12-01 MED ORDER — MAGNESIUM OXIDE 400 (241.3 MG) MG PO TABS
400.0000 mg | ORAL_TABLET | Freq: Every day | ORAL | Status: DC
  Administered 2018-12-01 – 2018-12-11 (×11): 400 mg via ORAL
  Filled 2018-12-01 (×11): qty 1

## 2018-12-01 MED ORDER — LIDOCAINE-EPINEPHRINE (PF) 2 %-1:200000 IJ SOLN
20.0000 mL | Freq: Once | INTRAMUSCULAR | Status: AC
  Administered 2018-12-01: 20 mL via INTRADERMAL
  Filled 2018-12-01: qty 20

## 2018-12-01 MED ORDER — ONDANSETRON HCL 4 MG PO TABS: 4.0000 mg | ORAL_TABLET | Freq: Four times a day (QID) | ORAL | Status: DC | PRN

## 2018-12-01 MED ORDER — HYDROCODONE-ACETAMINOPHEN 5-325 MG PO TABS
0.5000 | ORAL_TABLET | ORAL | Status: DC | PRN
  Administered 2018-12-03: 1 via ORAL
  Administered 2018-12-05: 0.5 via ORAL
  Filled 2018-12-01 (×2): qty 1

## 2018-12-01 MED ORDER — LORAZEPAM 1 MG PO TABS: 0.0000 mg | ORAL_TABLET | Freq: Two times a day (BID) | ORAL | Status: DC

## 2018-12-01 MED ORDER — AMLODIPINE BESYLATE 5 MG PO TABS
5.0000 mg | ORAL_TABLET | Freq: Every day | ORAL | Status: DC
  Administered 2018-12-02 – 2018-12-05 (×4): 5 mg via ORAL
  Filled 2018-12-01 (×4): qty 1

## 2018-12-01 MED ORDER — LORAZEPAM 2 MG/ML IJ SOLN: 0.0000 mg | Freq: Four times a day (QID) | INTRAMUSCULAR | Status: DC

## 2018-12-01 MED ORDER — LORAZEPAM 1 MG PO TABS
1.0000 mg | ORAL_TABLET | Freq: Four times a day (QID) | ORAL | Status: AC | PRN
  Filled 2018-12-01: qty 1

## 2018-12-01 MED ORDER — SODIUM CHLORIDE 0.9 % IV BOLUS
1000.0000 mL | Freq: Once | INTRAVENOUS | Status: AC
  Administered 2018-12-01: 1000 mL via INTRAVENOUS

## 2018-12-01 MED ORDER — LORAZEPAM 2 MG/ML IJ SOLN
1.0000 mg | Freq: Four times a day (QID) | INTRAMUSCULAR | Status: AC | PRN
  Administered 2018-12-02: 1 mg via INTRAVENOUS
  Filled 2018-12-01: qty 1

## 2018-12-01 MED ORDER — ASPIRIN EC 81 MG PO TBEC
81.0000 mg | DELAYED_RELEASE_TABLET | ORAL | Status: DC
  Administered 2018-12-02 – 2018-12-11 (×5): 81 mg via ORAL
  Filled 2018-12-01 (×6): qty 1

## 2018-12-01 MED ORDER — BENAZEPRIL HCL 20 MG PO TABS
20.0000 mg | ORAL_TABLET | Freq: Every day | ORAL | Status: DC
  Filled 2018-12-01 (×4): qty 1

## 2018-12-01 MED ORDER — SODIUM CHLORIDE 0.9 % IV SOLN
INTRAVENOUS | Status: AC
  Administered 2018-12-01: 23:00:00 via INTRAVENOUS

## 2018-12-01 MED ORDER — TETANUS-DIPHTH-ACELL PERTUSSIS 5-2.5-18.5 LF-MCG/0.5 IM SUSP
0.5000 mL | Freq: Once | INTRAMUSCULAR | Status: AC
  Administered 2018-12-01: 0.5 mL via INTRAMUSCULAR
  Filled 2018-12-01: qty 0.5

## 2018-12-01 MED ORDER — POLYETHYLENE GLYCOL 3350 17 G PO PACK: 17.0000 g | PACK | Freq: Every day | ORAL | Status: DC | PRN

## 2018-12-01 MED ORDER — ACETAMINOPHEN 325 MG PO TABS
650.0000 mg | ORAL_TABLET | Freq: Four times a day (QID) | ORAL | Status: DC | PRN
  Administered 2018-12-09: 650 mg via ORAL
  Filled 2018-12-01: qty 2

## 2018-12-01 MED ORDER — ROSUVASTATIN CALCIUM 20 MG PO TABS
20.0000 mg | ORAL_TABLET | Freq: Every day | ORAL | Status: DC
  Administered 2018-12-02 – 2018-12-11 (×10): 20 mg via ORAL
  Filled 2018-12-01 (×10): qty 1

## 2018-12-01 MED ORDER — ENSURE ENLIVE PO LIQD
237.0000 mL | Freq: Two times a day (BID) | ORAL | Status: DC
  Administered 2018-12-02 – 2018-12-10 (×16): 237 mL via ORAL
  Filled 2018-12-01: qty 237

## 2018-12-01 MED ORDER — ACETAMINOPHEN 650 MG RE SUPP: 650.0000 mg | Freq: Four times a day (QID) | RECTAL | Status: DC | PRN

## 2018-12-01 MED ORDER — FOLIC ACID 1 MG PO TABS
1.0000 mg | ORAL_TABLET | Freq: Every day | ORAL | Status: DC
  Administered 2018-12-01 – 2018-12-11 (×11): 1 mg via ORAL
  Filled 2018-12-01 (×11): qty 1

## 2018-12-01 MED ORDER — ONDANSETRON HCL 4 MG/2ML IJ SOLN: 4.0000 mg | Freq: Four times a day (QID) | INTRAMUSCULAR | Status: DC | PRN

## 2018-12-01 MED ORDER — LORAZEPAM 1 MG PO TABS
0.0000 mg | ORAL_TABLET | Freq: Four times a day (QID) | ORAL | Status: DC
  Administered 2018-12-01: 1 mg via ORAL
  Filled 2018-12-01: qty 1

## 2018-12-01 MED ORDER — THIAMINE HCL 100 MG/ML IJ SOLN
500.0000 mg | Freq: Three times a day (TID) | INTRAVENOUS | Status: AC
  Administered 2018-12-02 – 2018-12-03 (×7): 500 mg via INTRAVENOUS
  Filled 2018-12-01 (×7): qty 5

## 2018-12-01 MED ORDER — AMLODIPINE BESY-BENAZEPRIL HCL 5-20 MG PO CAPS: 1.0000 | ORAL_CAPSULE | Freq: Every day | ORAL | Status: DC

## 2018-12-01 MED ORDER — THIAMINE HCL 100 MG/ML IJ SOLN: 100.0000 mg | Freq: Every day | INTRAMUSCULAR | Status: DC

## 2018-12-01 NOTE — ED Provider Notes (Addendum)
[facility] EMERGENCY DEPARTMENT Provider Note   CSN: [id number] Arrival date & time: 12/01/18  2774     History   Chief Complaint Chief Complaint  Patient presents with  . Fall    HPI Victor Clarke is a 85 y.o. male who presents with a fall. PMH significant for alcoholism. He states he was fixing a snack at the stove and suddenly he became dizzy for about 30 seconds and fell. He was able to crawl to the phone to call 911. EMS found the patient on the floor. He states he drank gin yesterday. It's unclear if he fell last night or this morning. He reports some SOB - he states he gets winded easily. He has jaw/facial pain and several facial lacerations. He is unsure if he passed out. He denies neck pain, chest pain, abdominal pain. He denies dizziness currently. He reports a mild headache.   HPI  Past Medical History:  Diagnosis Date  . Alcoholic peripheral neuropathy ([place])   . Allergy   . Anemia   . Arthritis   . Cataract    left eye cataract removed  . COPD (chronic obstructive pulmonary disease) ([place])    pt denies 02-03-17  . Dehydration   . Detached retina    a. s/p surgical correction on the right.  . Ectopic atrial tachycardia ([place])    a. 08/2014 Echo: EF 55-60%, mildly dil LA.  Marland Kitchen EtOH dependence ([place])   . Fall   . Gout   . Gout   . Hypercholesteremia   . Hypertension   . Left inguinal hernia    a. s/p repair ~ 10 yrs ago.  . Malnutrition ([place])   . Orthostatic hypotension   . Parkinson's disease ([place])   . Prostate cancer ([place])    a. s/p TURP.    Patient Active Problem List   Diagnosis Date Noted  . First degree AV block   . Hypotension due to hypovolemia   . Syncope 06/18/2018  . Acute renal failure superimposed on stage 3 chronic kidney disease ([place]) 06/18/2018  . Leukopenia 06/18/2018  . AV block, [place] II 06/18/2018  . Memory loss 10/22/2016  . Malnutrition of moderate degree 11/15/2015  . Fall from steps 11/07/2015  . Anemia  11/07/2015  . Alcohol dependence ([place]) 11/07/2015  . Pyuria 11/07/2015  . Dehydration 08/08/2015  . UTI (urinary tract infection) 08/08/2015  . High anion gap metabolic acidosis 12/87/8676  . Dizziness 07/31/2015  . Orthostatic hypotension 07/31/2015  . Lactic acidosis 07/31/2015  . Ectopic atrial tachycardia ([place])   . Hypercholesteremia   . Dyspnea on exertion 09/16/2014  . Hypertension 09/16/2014    Past Surgical History:  Procedure Laterality Date  . COLONOSCOPY    . COLONOSCOPY W/ POLYPECTOMY  2001, 2006  . HERNIA REPAIR    . PROSTATE SURGERY          Home Medications    Prior to Admission medications   Medication Sig Start Date End Date Taking? Authorizing Provider  amLODipine-benazepril (LOTREL) 5-20 MG capsule Take 1 capsule by mouth daily.   Yes [provider]  rosuvastatin (CRESTOR) 20 MG tablet Take 20 mg by mouth daily.  03/17/18  Yes [provider]  aspirin EC 81 MG tablet Take 81 mg by mouth every Monday, Wednesday, and Friday.     [provider]  Cholecalciferol (VITAMIN D3) 5000 units CAPS Take 10,000 Units by mouth daily.    [provider]  Dermatological Products, [place]. (LEVICYN)  GEL Apply 1 application topically daily.    [provider]  feeding supplement, ENSURE ENLIVE, (ENSURE ENLIVE) LIQD Take 237 mLs by mouth 3 (three) times daily between meals. Patient taking differently: Take 237 mLs by mouth 2 (two) times daily.  08/08/15   Kelvin Cellar, MD  folic acid (FOLVITE) 379 MCG tablet Take 800 mcg by mouth daily.    [provider]  Magnesium Oxide 250 MG TABS Take 500 mg by mouth daily.    [provider]  Multiple Vitamin (MULTIVITAMIN) tablet Take 1 tablet by mouth daily.    [provider]    Family History Family History  Problem Relation Age of Onset  . Stroke Father        died @ 58.  Marland Kitchen Heart attack Mother        died @ 40  . Stroke Brother        died in his 56's    . Heart attack Brother        died in his 79's  . Colon cancer Neg Hx   . Esophageal cancer Neg Hx   . Rectal cancer Neg Hx   . Stomach cancer Neg Hx   . Prostate cancer Neg Hx     Social History Social History   Tobacco Use  . Smoking status: Former Smoker    Packs/day: 2.00    Years: 22.00    Pack years: 44.00    Types: Cigarettes    Last attempt to quit: 11/18/1977    Years since quitting: 41.0  . Smokeless tobacco: Never Used  Substance Use Topics  . Alcohol use: No    Alcohol/week: 2.0 standard drinks    Types: 1 Shots of liquor, 1 Standard drinks or equivalent per week    Comment: None currently since previous December.   . Drug use: No     Allergies   Patient has no known allergies.   Review of Systems Review of Systems  Constitutional: Negative for chills and fever.  Respiratory: Positive for shortness of breath. Negative for cough and wheezing.   Cardiovascular: Negative for chest pain.  Gastrointestinal: Negative for abdominal pain.  Genitourinary: Negative for difficulty urinating.  Musculoskeletal: Positive for arthralgias.  Skin: Negative for wound.  Neurological: Positive for dizziness and headaches. Negative for syncope and numbness.     Physical Exam Updated Vital Signs BP (!) 142/80   Pulse 76   Temp 98.6 F (37 C) (Oral)   Resp 18   SpO2 100%   Physical Exam Vitals signs and nursing note reviewed.  Constitutional:      General: He is not in acute distress.    Appearance: He is well-developed.     Comments: Alert, elderly male in C-collar. He is mildly confused vs intoxicated but able to converse. Smells like alcohol.   HENT:     Head: Normocephalic.     Comments: Lip and chin laceration with associated bruising and swelling    Mouth/Throat:     Comments: Upper partial dentures are absent Eyes:     General: No scleral icterus.       Right eye: No discharge.        Left eye: No discharge.     Conjunctiva/sclera: Conjunctivae  normal.     Pupils: Pupils are equal, round, and reactive to light.  Neck:     Musculoskeletal: Normal range of motion.  Cardiovascular:     Rate and Rhythm: Normal rate and regular rhythm.  Pulmonary:  Effort: Pulmonary effort is normal. No respiratory distress.     Breath sounds: Normal breath sounds.  Abdominal:     General: Abdomen is flat. There is no distension.     Tenderness: There is no abdominal tenderness.  Musculoskeletal:     Comments: Right hand: FROM of all fingers. Skin tear over dorsal hand. 2+ radial pulse  Skin:    General: Skin is warm and dry.  Neurological:     Mental Status: He is alert and oriented to person, place, and time.     Comments: Moves all 4 extremities without difficulty  Psychiatric:        Behavior: Behavior normal.      ED Treatments / Results  Labs (all labs ordered are listed, but only abnormal results are displayed) Labs Reviewed  ETHANOL - Abnormal; Notable for the following components:      Result Value   Alcohol, Ethyl (B) 196 (*)    All other components within normal limits  COMPREHENSIVE METABOLIC PANEL - Abnormal; Notable for the following components:   CO2 20 (*)    Total Protein 6.4 (*)    AST 70 (*)    ALT 46 (*)    Anion gap 16 (*)    All other components within normal limits  CBC WITH DIFFERENTIAL/PLATELET - Abnormal; Notable for the following components:   RBC 3.66 (*)    Hemoglobin 12.8 (*)    MCV 106.6 (*)    MCH 35.0 (*)    All other components within normal limits  CK  I-STAT TROPONIN, ED    EKG None  Radiology Dg Chest 2 View  Result Date: 12/01/2018 CLINICAL DATA:  85 year old who fell this morning and injured his face. Current history of atrial fibrillation. EXAM: CHEST - 2 VIEW COMPARISON:  11/19/2018 and earlier, including CTA chest 04/30/2018. FINDINGS: AP ERECT and LATERAL images were obtained. Suboptimal inspiration which accounts for atelectasis in the lower lobes and RIGHT MIDDLE LOBE.  Cardiac silhouette moderately enlarged, unchanged. Thoracic aorta atherosclerotic, unchanged. Hilar and mediastinal contours otherwise unremarkable. Chronic interstitial lung disease with associated low lung volumes, unchanged. Lungs otherwise clear. No confluent airspace consolidation. No pleural effusions. Visualized bony thorax intact. IMPRESSION: 1. Suboptimal inspiration accounts for atelectasis in the lower lobes and RIGHT middle lobe. No acute cardiopulmonary disease otherwise. 2. Stable cardiomegaly without pulmonary edema. 3. Chronic interstitial lung disease, likely pulmonary fibrosis. Electronically Signed   By: Evangeline Dakin M.D.   On: 12/01/2018 11:15   Ct Head Wo Contrast  Result Date: 12/01/2018 CLINICAL DATA:  Fall.  Left jaw pain. EXAM: CT HEAD WITHOUT CONTRAST CT MAXILLOFACIAL WITHOUT CONTRAST CT CERVICAL SPINE WITHOUT CONTRAST TECHNIQUE: Multidetector CT imaging of the head, cervical spine, and maxillofacial structures were performed using the standard protocol without intravenous contrast. Multiplanar CT image reconstructions of the cervical spine and maxillofacial structures were also generated. COMPARISON:  CT head and cervical spine dated November 19, 2018. FINDINGS: CT HEAD FINDINGS Brain: No evidence of acute infarction, hemorrhage, hydrocephalus, extra-axial collection or mass lesion/mass effect. Stable moderate atrophy and mild chronic microvascular ischemic changes. Vascular: Atherosclerotic vascular calcification of the carotid siphons. No hyperdense vessel. Skull: Normal. Negative for fracture or focal lesion. Other: None. CT MAXILLOFACIAL FINDINGS Osseous: The right maxillary cuspid tooth is now protruding through the maxillary roof, new when compared to prior study. No fracture or mandibular dislocation. No destructive process. Orbits: Negative. No traumatic or inflammatory finding. Sinuses: Minimal ethmoid air cell and maxillary sinus mucosal thickening. Remaining  paranasal  sinuses and mastoid air cells are clear. Soft tissues: Small amount of subcutaneous emphysema and fluid adjacent to the impacted right maxillary cuspid tooth root. CT CERVICAL SPINE FINDINGS Alignment: No traumatic malalignment. Unchanged trace anterolisthesis at C2-C3, C7-T1, T1-T2, and T2-T3. Skull base and vertebrae: No acute fracture. No primary bone lesion or focal pathologic process. Soft tissues and spinal canal: No prevertebral fluid or swelling. No visible canal hematoma. Disc levels: Unchanged moderate to severe multilevel cervical spondylosis from C3-C4 through C6-C7. Upper chest: Paraseptal and centrilobular emphysema. Other: Bilateral carotid artery calcific atherosclerosis. IMPRESSION: CT head: 1.  No acute intracranial abnormality. 2. Stable moderate atrophy and mild chronic microvascular ischemic changes. CT maxillofacial: 1. New impaction of the right maxillary cuspid tooth with protrusion of the root through the maxillary roof. 2.  No acute maxillofacial fracture. CT cervical spine: 1.  No acute cervical spine fracture. 2. Unchanged moderate to severe multilevel cervical spondylosis. 3.  Emphysema (ICD10-J43.9). Electronically Signed   By: Titus Dubin M.D.   On: 12/01/2018 11:51   Ct Cervical Spine Wo Contrast  Result Date: 12/01/2018 CLINICAL DATA:  Fall.  Left jaw pain. EXAM: CT HEAD WITHOUT CONTRAST CT MAXILLOFACIAL WITHOUT CONTRAST CT CERVICAL SPINE WITHOUT CONTRAST TECHNIQUE: Multidetector CT imaging of the head, cervical spine, and maxillofacial structures were performed using the standard protocol without intravenous contrast. Multiplanar CT image reconstructions of the cervical spine and maxillofacial structures were also generated. COMPARISON:  CT head and cervical spine dated November 19, 2018. FINDINGS: CT HEAD FINDINGS Brain: No evidence of acute infarction, hemorrhage, hydrocephalus, extra-axial collection or mass lesion/mass effect. Stable moderate atrophy and mild chronic  microvascular ischemic changes. Vascular: Atherosclerotic vascular calcification of the carotid siphons. No hyperdense vessel. Skull: Normal. Negative for fracture or focal lesion. Other: None. CT MAXILLOFACIAL FINDINGS Osseous: The right maxillary cuspid tooth is now protruding through the maxillary roof, new when compared to prior study. No fracture or mandibular dislocation. No destructive process. Orbits: Negative. No traumatic or inflammatory finding. Sinuses: Minimal ethmoid air cell and maxillary sinus mucosal thickening. Remaining paranasal sinuses and mastoid air cells are clear. Soft tissues: Small amount of subcutaneous emphysema and fluid adjacent to the impacted right maxillary cuspid tooth root. CT CERVICAL SPINE FINDINGS Alignment: No traumatic malalignment. Unchanged trace anterolisthesis at C2-C3, C7-T1, T1-T2, and T2-T3. Skull base and vertebrae: No acute fracture. No primary bone lesion or focal pathologic process. Soft tissues and spinal canal: No prevertebral fluid or swelling. No visible canal hematoma. Disc levels: Unchanged moderate to severe multilevel cervical spondylosis from C3-C4 through C6-C7. Upper chest: Paraseptal and centrilobular emphysema. Other: Bilateral carotid artery calcific atherosclerosis. IMPRESSION: CT head: 1.  No acute intracranial abnormality. 2. Stable moderate atrophy and mild chronic microvascular ischemic changes. CT maxillofacial: 1. New impaction of the right maxillary cuspid tooth with protrusion of the root through the maxillary roof. 2.  No acute maxillofacial fracture. CT cervical spine: 1.  No acute cervical spine fracture. 2. Unchanged moderate to severe multilevel cervical spondylosis. 3.  Emphysema (ICD10-J43.9). Electronically Signed   By: Titus Dubin M.D.   On: 12/01/2018 11:51   Dg Hand Complete Right  Result Date: 12/01/2018 CLINICAL DATA:  Right hand pain. Fell today. EXAM: RIGHT HAND - COMPLETE 3+ VIEW COMPARISON:  None. FINDINGS: No  evidence of fracture or dislocation. Mild inter phalangeal joint osteoarthritis. IMPRESSION: No acute or traumatic finding. Mild osteoarthritis. Electronically Signed   By: Nelson Chimes M.D.   On: 12/01/2018 13:23   Ct Maxillofacial  Wo Contrast  Result Date: 12/01/2018 CLINICAL DATA:  Fall.  Left jaw pain. EXAM: CT HEAD WITHOUT CONTRAST CT MAXILLOFACIAL WITHOUT CONTRAST CT CERVICAL SPINE WITHOUT CONTRAST TECHNIQUE: Multidetector CT imaging of the head, cervical spine, and maxillofacial structures were performed using the standard protocol without intravenous contrast. Multiplanar CT image reconstructions of the cervical spine and maxillofacial structures were also generated. COMPARISON:  CT head and cervical spine dated November 19, 2018. FINDINGS: CT HEAD FINDINGS Brain: No evidence of acute infarction, hemorrhage, hydrocephalus, extra-axial collection or mass lesion/mass effect. Stable moderate atrophy and mild chronic microvascular ischemic changes. Vascular: Atherosclerotic vascular calcification of the carotid siphons. No hyperdense vessel. Skull: Normal. Negative for fracture or focal lesion. Other: None. CT MAXILLOFACIAL FINDINGS Osseous: The right maxillary cuspid tooth is now protruding through the maxillary roof, new when compared to prior study. No fracture or mandibular dislocation. No destructive process. Orbits: Negative. No traumatic or inflammatory finding. Sinuses: Minimal ethmoid air cell and maxillary sinus mucosal thickening. Remaining paranasal sinuses and mastoid air cells are clear. Soft tissues: Small amount of subcutaneous emphysema and fluid adjacent to the impacted right maxillary cuspid tooth root. CT CERVICAL SPINE FINDINGS Alignment: No traumatic malalignment. Unchanged trace anterolisthesis at C2-C3, C7-T1, T1-T2, and T2-T3. Skull base and vertebrae: No acute fracture. No primary bone lesion or focal pathologic process. Soft tissues and spinal canal: No prevertebral fluid or  swelling. No visible canal hematoma. Disc levels: Unchanged moderate to severe multilevel cervical spondylosis from C3-C4 through C6-C7. Upper chest: Paraseptal and centrilobular emphysema. Other: Bilateral carotid artery calcific atherosclerosis. IMPRESSION: CT head: 1.  No acute intracranial abnormality. 2. Stable moderate atrophy and mild chronic microvascular ischemic changes. CT maxillofacial: 1. New impaction of the right maxillary cuspid tooth with protrusion of the root through the maxillary roof. 2.  No acute maxillofacial fracture. CT cervical spine: 1.  No acute cervical spine fracture. 2. Unchanged moderate to severe multilevel cervical spondylosis. 3.  Emphysema (ICD10-J43.9). Electronically Signed   By: Titus Dubin M.D.   On: 12/01/2018 11:51    Procedures .Marland KitchenLaceration Repair Date/Time: 12/01/2018 4:02 PM Performed by: Recardo Evangelist, PA-C Authorized by: Recardo Evangelist, PA-C   Consent:    Consent obtained:  Verbal   Consent given by:  Patient   Risks discussed:  Infection and pain   Alternatives discussed:  No treatment Anesthesia (see MAR for exact dosages):    Anesthesia method:  Local infiltration   Local anesthetic:  Lidocaine 2% WITH epi Laceration details:    Location:  Lip   Lip location:  Upper exterior lip (left upper)   Length (cm):  3   Depth (mm):  5 Repair type:    Repair type:  Simple Pre-procedure details:    Preparation:  Patient was prepped and draped in usual sterile fashion Exploration:    Wound exploration: wound explored through full range of motion and entire depth of wound probed and visualized     Contaminated: no   Treatment:    Area cleansed with:  Saline   Amount of cleaning:  Standard   Irrigation volume:  10cc   Irrigation method:  Tap   Visualized foreign bodies/material removed: no   Skin repair:    Repair method:  Sutures   Suture size:  6-0   Wound skin closure material used: vicryl.   Suture technique:  Simple  interrupted   Number of sutures:  3 Approximation:    Approximation:  Close   Vermilion border: well-aligned   Post-procedure  details:    Dressing:  Open (no dressing)   Patient tolerance of procedure:  Tolerated well, no immediate complications .Marland KitchenLaceration Repair Date/Time: 12/01/2018 4:04 PM Performed by: Recardo Evangelist, PA-C Authorized by: Recardo Evangelist, PA-C   Consent:    Consent obtained:  Verbal   Consent given by:  Patient   Risks discussed:  Infection, need for additional repair, pain, poor cosmetic result and poor wound healing   Alternatives discussed:  No treatment and delayed treatment Universal protocol:    Procedure explained and questions answered to patient or proxy's satisfaction: yes     Relevant documents present and verified: yes     Test results available and properly labeled: yes     Imaging studies available: yes     Required blood products, implants, devices, and special equipment available: yes     Site/side marked: yes     Immediately prior to procedure, a time out was called: yes     Patient identity confirmed:  Verbally with patient Anesthesia (see MAR for exact dosages):    Anesthesia method:  Local infiltration   Local anesthetic:  Lidocaine 2% w/o epi Laceration details:    Location: chin.   Length (cm):  3   Depth (mm):  5 Repair type:    Repair type:  Simple Exploration:    Hemostasis achieved with:  Direct pressure   Wound exploration: wound explored through full range of motion and entire depth of wound probed and visualized     Wound extent: no underlying fracture noted     Contaminated: no   Treatment:    Area cleansed with:  Saline   Amount of cleaning:  Standard   Irrigation volume:  10cc Skin repair:    Repair method:  Sutures   Suture size:  6-0   Wound skin closure material used: vicryl.   Suture technique:  Simple interrupted   Number of sutures:  3 Approximation:    Approximation:  Close Post-procedure details:      Dressing:  Open (no dressing)   Patient tolerance of procedure:  Tolerated well, no immediate complications   (including critical care time)    Medications Ordered in ED Medications  LORazepam (ATIVAN) injection 0-4 mg ( Intravenous See Alternative 12/01/18 1129)    Or  LORazepam (ATIVAN) tablet 0-4 mg (1 mg Oral Given 12/01/18 1129)  LORazepam (ATIVAN) injection 0-4 mg (has no administration in time range)    Or  LORazepam (ATIVAN) tablet 0-4 mg (has no administration in time range)  thiamine (VITAMIN B-1) tablet 100 mg (100 mg Oral Given 12/01/18 1129)    Or  thiamine (B-1) injection 100 mg ( Intravenous See Alternative 12/01/18 1129)  sodium chloride 0.9 % bolus 1,000 mL (has no administration in time range)  Tdap (BOOSTRIX) injection 0.5 mL (0.5 mLs Intramuscular Given 12/01/18 1128)  lidocaine-EPINEPHrine (XYLOCAINE W/EPI) 2 %-1:200000 (PF) injection 20 mL (20 mLs Intradermal Given 12/01/18 1128)     Initial Impression / Assessment and Plan / ED Course  I have reviewed the triage vital signs and the nursing notes.  Pertinent labs & imaging results that were available during my care of the patient were reviewed by me and considered in my medical decision making (see chart for details).  85 year old male presents with a fall after severe episode of dizziness while trying to make food. It's unclear if this happened last night vs this morning. He is mildly hypertensive but otherwise vitals are normal. He is alert  but somewhat confused. Exam is remarkable for several facial lacerations which were repaired. See procedure note for details. His tetanus was updated. He denies any significant pain complaints currently or dizziness. He states he's somewhat SOB. Will obtain CT head, face, C-spine and CXR and labs.  CBC is remarkable for mild anemia with high MCV. CMP is remarkable for low bicarb (20), high anion gap (16), elevated LFTs. This is all likely due to his alcoholism. Trop is 0.03.  CXR shows chronic changes with atelectasis. CT head and C-spine are normal. CT Max-face shows impaction of his tooth through the maxillary roof but otherwise no acute facial fractures  His RN tried to ambulated him who has had him as a patient in the past. He was very unsteady and she noted this is not his baseline. His ETOH level was significantly elevated. Will let him sober up and obtain MRI brain because of his dizziness. SW consult was placed as well. Care signed out to [facility] who will dispo.    Final Clinical Impressions(s) / ED Diagnoses   Final diagnoses:  Fall, initial encounter  Dizziness  Lip laceration, initial encounter  Chin laceration, initial encounter  Alcoholic intoxication without complication [facility])  Fracture of dental implant    ED Discharge Orders    None       Recardo Evangelist, PA-C 12/01/18 1610    Recardo Evangelist, PA-C 12/01/18 [place]    Gareth Morgan, MD 12/03/18 2350

## 2018-12-01 NOTE — ED Notes (Signed)
Attempted report, unsuccessful. Dr. Myna Hidalgo at bedside.

## 2018-12-01 NOTE — ED Triage Notes (Addendum)
Pt reports a fall this morning, pt confused to time. Pt was recently discharged [facility] but is unsure when. Small laceration noted to upper lip and chin, right hand skin tear.  Pt was in the floor when EMS arrived.  Hx of Afib but no blood thinner. Pt denies pain, c-collar present. Pt reports last alcoholic drink was yesterday.

## 2018-12-01 NOTE — ED Notes (Signed)
Pt ambulated with patient. Pt able to ambulate a few steps, but needed assistance due to balance issues. EDP notified.

## 2018-12-01 NOTE — ED Notes (Signed)
Patient transported to X-ray 

## 2018-12-01 NOTE — Progress Notes (Signed)
CSW aware of consult for placement- attempted to contact EDP to determine needs for patient. Per notes, patient getting MRI due to being unsteady. Per notes, patients ETOH levels were also elevated on arrival. CSW will continue to follow for disposition needs.   Kingsley Spittle, LCSW Clinical Social Worker  [facility]  [phone]

## 2018-12-01 NOTE — ED Notes (Signed)
Patient transported to MRI 

## 2018-12-01 NOTE — ED Notes (Signed)
Pt very unsteady upon ambulation.  PA [place] notified.

## 2018-12-01 NOTE — ED Provider Notes (Signed)
Physical Exam  BP (!) 151/84   Pulse 80   Temp 98.6 F (37 C) (Oral)   Resp 14   SpO2 100%   Assumed care from Janetta Hora, PA-C at 1600. Briefly, the patient is a 85 y.o. male with PMHx of  has a past medical history of Alcoholic peripheral neuropathy ([place]), Allergy, Anemia, Arthritis, Cataract, COPD (chronic obstructive pulmonary disease) ([place]), Dehydration, Detached retina, Ectopic atrial tachycardia ([place]), EtOH dependence ([place]), Fall, Gout, Gout, Hypercholesteremia, Hypertension, Left inguinal hernia, Malnutrition ([place]), Orthostatic hypotension, Parkinson's disease ([place]), and Prostate cancer ([place]). here with alcohol intoxication and fall.  Patient was still actively intoxicated on exam by previous provider.  CT head without intracranial hemorrhage.  CT cervical spine without acute fracture.  CT maxillofacial without fracture.  Platelets normal.  Due to patient's continued unsteadiness with attempts to walk, MRI brain was ordered without any evidence of acute stroke.  Patient does have a incidental finding of meningioma, likely incidental finding.  Patient's lacerations were primarily closed by previous PA.  Sutures placed with dissolvable sutures, no need for removal.  Labs Reviewed  ETHANOL - Abnormal; Notable for the following components:      Result Value   Alcohol, Ethyl (B) 196 (*)    All other components within normal limits  COMPREHENSIVE METABOLIC PANEL - Abnormal; Notable for the following components:   CO2 20 (*)    Total Protein 6.4 (*)    AST 70 (*)    ALT 46 (*)    Anion gap 16 (*)    All other components within normal limits  CBC WITH DIFFERENTIAL/PLATELET - Abnormal; Notable for the following components:   RBC 3.66 (*)    Hemoglobin 12.8 (*)    MCV 106.6 (*)    MCH 35.0 (*)    All other components within normal limits  CK - Abnormal; Notable for the following components:   Total CK 529 (*)    All other components within normal limits  I-STAT TROPONIN, ED     Course of Care:   Physical Exam Vitals signs and nursing note reviewed.  Constitutional:      General: He is not in acute distress.    Appearance: He is well-developed.     Comments: Appears unkempt and chronically ill appearing.   HENT:     Head: Normocephalic and atraumatic.     Mouth/Throat:     Comments: Crusted blood around mouth.  Lip laceration primarily closed.  Eyes:     General:        Right eye: No discharge.        Left eye: No discharge.     Conjunctiva/sclera: Conjunctivae normal.     Comments: EOMs normal to gross examination.  Neck:     Musculoskeletal: Normal range of motion.  Cardiovascular:     Rate and Rhythm: Normal rate and regular rhythm.     Comments: Intact, 2+ right radial pulse. Abdominal:     General: There is no distension.  Musculoskeletal: Normal range of motion.  Skin:    General: Skin is warm and dry.     Comments: Skin tear to dorsum of right hand.   Neurological:     Mental Status: He is alert.     Comments: Cranial nerves intact to gross observation. Patient moves extremities without difficulty.  Psychiatric:        Behavior: Behavior normal.        Thought Content: Thought content normal.  Judgment: Judgment normal.     ED Course/Procedures   Clinical Course as of Dec 01 2004  Tue Dec 01, 2018  1556 Likely 2/2 alcohol.  Anion gap(!): 16 [AM]  1557 Likely 2/2 alcohol.   AST(!): 70 [AM]  2004 Spoke with Dr. Rory Percy who states that patient has been maximally worked up from a neurologic standpoint.  Patient's ataxia continues to be out of proportion to his level sobriety at present.  Will discuss admission with hospitalist.   [AM]    Clinical Course User Index [AM] Albesa Seen, PA-C    Procedures  MDM   Patient does live alone.  Social work was consulted.  Ambulation was attempted with patient.  Patient continues to be ataxic and requires assistance.  He is unable to walk alone.  This appears to be a  progression from his typical baseline.  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  States that patient will require high-dose thiamine and recommends hemoglobin A1c, TSH, and B12 level.  Appreciate Dr. Johny Chess involvement in the care of this patient.  Due to patient's continuing ataxia that appears to make him a danger to himself at home, admission sought for patient.  Case discussed with Dr. Myna Hidalgo who is admitting.  Appreciate his involvement of care of this patient.   Albesa Seen, PA-C 12/02/18 [place], [place], DO 12/03/18 [phone]

## 2018-12-01 NOTE — H&P (Signed)
History and Physical    Victor Clarke JXB:[id number] DOB: 1928-12-18 DOA: 12/01/2018  PCP: Sherald Hess., MD   Patient coming from: Home   Chief Complaint: Fall   HPI: Victor Clarke is a 85 y.o. male with medical history significant for hypertension, alcohol dependence, and COPD, now presenting to the emergency department after a fall at home.  Patient reports that he had been experiencing some nonspecific malaise, was somewhat lightheaded, did not lose consciousness, but fell to the ground, striking his face on the floor.  There was no loss of consciousness, he has been able to bear weight since the fall, and called EMS for transport to the hospital.  He denies any recent fevers, chills, chest pain, cough, or shortness of breath.  He had some mild lower back pain upon waking this morning, but that resolved over the course of the day.  Denies any headache, change in vision hearing, or focal numbness or weakness.  Denies any history of withdrawal seizures.  ED Course: Upon arrival to the ED, patient is found to be afebrile, saturating well on room air, and with vitals otherwise normal.  Chemistry panel is notable for slight elevation in transaminases and CBC features a mild macrocytic anemia.  Ethanol level was elevated to 196 and serum CK elevated 529.  Troponin is normal.  Chest x-ray is negative for acute cardiopulmonary disease.  Head CT is negative for acute intracranial abnormality.  MRI brain is negative for acute intracranial abnormality.  Maxillofacial and cervical spine CT are negative for acute fracture.  Patient was given a liter of normal saline, thiamine, and folate in the ED.  He was monitored for several hours in the ED, was clinically sober, but continued to have some ataxia and required assistance with ambulation.  Neurology was consulted by the ED physician, advised that the gait difficulty is likely secondary to alcoholism and recommended checking TSH, B12, and A1c, and  treating with high-dose thiamine.  Review of Systems:  All other systems reviewed and apart from HPI, are negative.  Past Medical History:  Diagnosis Date  . Alcoholic peripheral neuropathy ([place])   . Allergy   . Anemia   . Arthritis   . Cataract    left eye cataract removed  . COPD (chronic obstructive pulmonary disease) ([place])    pt denies 02-03-17  . Dehydration   . Detached retina    a. s/p surgical correction on the right.  . Ectopic atrial tachycardia ([place])    a. 08/2014 Echo: EF 55-60%, mildly dil LA.  Marland Kitchen EtOH dependence ([place])   . Fall   . Gout   . Gout   . Hypercholesteremia   . Hypertension   . Left inguinal hernia    a. s/p repair ~ 10 yrs ago.  . Malnutrition ([place])   . Orthostatic hypotension   . Parkinson's disease ([place])   . Prostate cancer ([place])    a. s/p TURP.    Past Surgical History:  Procedure Laterality Date  . COLONOSCOPY    . COLONOSCOPY W/ POLYPECTOMY  2001, 2006  . HERNIA REPAIR    . PROSTATE SURGERY       reports that he quit smoking about 41 years ago. His smoking use included cigarettes. He has a 44.00 pack-year smoking history. He has never used smokeless tobacco. He reports that he does not drink alcohol or use drugs.  No Known Allergies  Family History  Problem Relation Age of Onset  . Stroke Father  died @ 63.  Marland Kitchen Heart attack Mother        died @ 8  . Stroke Brother        died in his 47's  . Heart attack Brother        died in his 39's  . Colon cancer Neg Hx   . Esophageal cancer Neg Hx   . Rectal cancer Neg Hx   . Stomach cancer Neg Hx   . Prostate cancer Neg Hx      Prior to Admission medications   Medication Sig Start Date End Date Taking? Authorizing Provider  amLODipine-benazepril (LOTREL) 5-20 MG capsule Take 1 capsule by mouth daily.   Yes [provider]  rosuvastatin (CRESTOR) 20 MG tablet Take 20 mg by mouth daily.  03/17/18  Yes [provider]  aspirin EC 81 MG tablet Take 81 mg by mouth  every Monday, Wednesday, and Friday.     [provider]  Cholecalciferol (VITAMIN D3) 5000 units CAPS Take 10,000 Units by mouth daily.    [provider]  Dermatological Products, [place]. (LEVICYN) GEL Apply 1 application topically daily.    [provider]  feeding supplement, ENSURE ENLIVE, (ENSURE ENLIVE) LIQD Take 237 mLs by mouth 3 (three) times daily between meals. Patient taking differently: Take 237 mLs by mouth 2 (two) times daily.  08/08/15   Kelvin Cellar, MD  folic acid (FOLVITE) 270 MCG tablet Take 800 mcg by mouth daily.    [provider]  Magnesium Oxide 250 MG TABS Take 500 mg by mouth daily.    [provider]  Multiple Vitamin (MULTIVITAMIN) tablet Take 1 tablet by mouth daily.    [provider]    Physical Exam: Vitals:   12/01/18 1545 12/01/18 1600 12/01/18 1728 12/01/18 1821  BP: 124/74 136/81 (!) 151/84 (!) 164/74  Pulse: 80 76 80 78  Resp: 14 18 14 14   Temp:      TempSrc:      SpO2: 98% 100% 100% 99%    Constitutional: NAD, calm  Eyes: PERTLA, lids and conjunctivae normal ENMT: Mucous membranes are moist. Edema and ecchymosis to right lower lip.   Neck: normal, supple, no masses, no thyromegaly Respiratory: clear to auscultation bilaterally, no wheezing, no crackles. Normal respiratory effort.    Cardiovascular: S1 & S2 heard, regular rate and rhythm. No extremity edema.   Abdomen: No distension, no tenderness, soft. Bowel sounds normal.  Musculoskeletal: no clubbing / cyanosis. No joint deformity upper and lower extremities.    Skin: no significant rashes, lesions, ulcers. Warm, dry, well-perfused. Neurologic: CN 2-12 grossly intact. Sensation intact. Strength 5/5 in all 4 limbs.  Psychiatric: Alert and oriented to person, place, and situation. Pleasant and cooperative.    Labs on Admission: I have personally reviewed following labs and imaging studies  CBC: Recent Labs  Lab 12/01/18 1059  WBC  6.6  NEUTROABS 5.0  HGB 12.8*  HCT 39.0  MCV 106.6*  PLT 350   Basic Metabolic Panel: Recent Labs  Lab 12/01/18 1059  NA 144  K 3.7  CL 108  CO2 20*  GLUCOSE 89  BUN 17  CREATININE 1.02  CALCIUM 9.4   GFR: CrCl cannot be calculated (Unknown ideal weight.). Liver Function Tests: Recent Labs  Lab 12/01/18 1059  AST 70*  ALT 46*  ALKPHOS 41  BILITOT 0.9  PROT 6.4*  ALBUMIN 3.6   No results for input(s): LIPASE, AMYLASE in the last 168 hours. No results for input(s):  AMMONIA in the last 168 hours. Coagulation Profile: No results for input(s): INR, PROTIME in the last 168 hours. Cardiac Enzymes: Recent Labs  Lab 12/01/18 1600  CKTOTAL 529*   BNP (last 3 results) No results for input(s): PROBNP in the last 8760 hours. HbA1C: Recent Labs    12/01/18 2011  HGBA1C 5.4   CBG: No results for input(s): GLUCAP in the last 168 hours. Lipid Profile: No results for input(s): CHOL, HDL, LDLCALC, TRIG, CHOLHDL, LDLDIRECT in the last 72 hours. Thyroid Function Tests: Recent Labs    12/01/18 2011  TSH 0.587   Anemia Panel: Recent Labs    12/01/18 2011  VITAMINB12 363   Urine analysis:    Component Value Date/Time   COLORURINE YELLOW 12/01/2018 1850   APPEARANCEUR CLEAR 12/01/2018 1850   LABSPEC 1.023 12/01/2018 1850   PHURINE 6.0 12/01/2018 1850   GLUCOSEU 50 (A) 12/01/2018 1850   HGBUR NEGATIVE 12/01/2018 1850   BILIRUBINUR NEGATIVE 12/01/2018 1850   KETONESUR 5 (A) 12/01/2018 1850   PROTEINUR 30 (A) 12/01/2018 1850   UROBILINOGEN 1.0 08/05/2015 1155   NITRITE NEGATIVE 12/01/2018 1850   LEUKOCYTESUR NEGATIVE 12/01/2018 1850   Sepsis Labs: @LABRCNTIP (procalcitonin:4,lacticidven:4) )No results found for this or any previous visit (from the past 240 hour(s)).   Radiological Exams on Admission: Dg Chest 2 View  Result Date: 12/01/2018 CLINICAL DATA:  85 year old who fell this morning and injured his face. Current history of atrial fibrillation. EXAM:  CHEST - 2 VIEW COMPARISON:  11/19/2018 and earlier, including CTA chest 04/30/2018. FINDINGS: AP ERECT and LATERAL images were obtained. Suboptimal inspiration which accounts for atelectasis in the lower lobes and RIGHT MIDDLE LOBE. Cardiac silhouette moderately enlarged, unchanged. Thoracic aorta atherosclerotic, unchanged. Hilar and mediastinal contours otherwise unremarkable. Chronic interstitial lung disease with associated low lung volumes, unchanged. Lungs otherwise clear. No confluent airspace consolidation. No pleural effusions. Visualized bony thorax intact. IMPRESSION: 1. Suboptimal inspiration accounts for atelectasis in the lower lobes and RIGHT middle lobe. No acute cardiopulmonary disease otherwise. 2. Stable cardiomegaly without pulmonary edema. 3. Chronic interstitial lung disease, likely pulmonary fibrosis. Electronically Signed   By: Evangeline Dakin M.D.   On: 12/01/2018 11:15   Ct Head Wo Contrast  Result Date: 12/01/2018 CLINICAL DATA:  Fall.  Left jaw pain. EXAM: CT HEAD WITHOUT CONTRAST CT MAXILLOFACIAL WITHOUT CONTRAST CT CERVICAL SPINE WITHOUT CONTRAST TECHNIQUE: Multidetector CT imaging of the head, cervical spine, and maxillofacial structures were performed using the standard protocol without intravenous contrast. Multiplanar CT image reconstructions of the cervical spine and maxillofacial structures were also generated. COMPARISON:  CT head and cervical spine dated November 19, 2018. FINDINGS: CT HEAD FINDINGS Brain: No evidence of acute infarction, hemorrhage, hydrocephalus, extra-axial collection or mass lesion/mass effect. Stable moderate atrophy and mild chronic microvascular ischemic changes. Vascular: Atherosclerotic vascular calcification of the carotid siphons. No hyperdense vessel. Skull: Normal. Negative for fracture or focal lesion. Other: None. CT MAXILLOFACIAL FINDINGS Osseous: The right maxillary cuspid tooth is now protruding through the maxillary roof, new when compared  to prior study. No fracture or mandibular dislocation. No destructive process. Orbits: Negative. No traumatic or inflammatory finding. Sinuses: Minimal ethmoid air cell and maxillary sinus mucosal thickening. Remaining paranasal sinuses and mastoid air cells are clear. Soft tissues: Small amount of subcutaneous emphysema and fluid adjacent to the impacted right maxillary cuspid tooth root. CT CERVICAL SPINE FINDINGS Alignment: No traumatic malalignment. Unchanged trace anterolisthesis at C2-C3, C7-T1, T1-T2, and T2-T3. Skull base and vertebrae: No acute fracture. No primary  bone lesion or focal pathologic process. Soft tissues and spinal canal: No prevertebral fluid or swelling. No visible canal hematoma. Disc levels: Unchanged moderate to severe multilevel cervical spondylosis from C3-C4 through C6-C7. Upper chest: Paraseptal and centrilobular emphysema. Other: Bilateral carotid artery calcific atherosclerosis. IMPRESSION: CT head: 1.  No acute intracranial abnormality. 2. Stable moderate atrophy and mild chronic microvascular ischemic changes. CT maxillofacial: 1. New impaction of the right maxillary cuspid tooth with protrusion of the root through the maxillary roof. 2.  No acute maxillofacial fracture. CT cervical spine: 1.  No acute cervical spine fracture. 2. Unchanged moderate to severe multilevel cervical spondylosis. 3.  Emphysema (ICD10-J43.9). Electronically Signed   By: Titus Dubin M.D.   On: 12/01/2018 11:51   Ct Cervical Spine Wo Contrast  Result Date: 12/01/2018 CLINICAL DATA:  Fall.  Left jaw pain. EXAM: CT HEAD WITHOUT CONTRAST CT MAXILLOFACIAL WITHOUT CONTRAST CT CERVICAL SPINE WITHOUT CONTRAST TECHNIQUE: Multidetector CT imaging of the head, cervical spine, and maxillofacial structures were performed using the standard protocol without intravenous contrast. Multiplanar CT image reconstructions of the cervical spine and maxillofacial structures were also generated. COMPARISON:  CT head and  cervical spine dated November 19, 2018. FINDINGS: CT HEAD FINDINGS Brain: No evidence of acute infarction, hemorrhage, hydrocephalus, extra-axial collection or mass lesion/mass effect. Stable moderate atrophy and mild chronic microvascular ischemic changes. Vascular: Atherosclerotic vascular calcification of the carotid siphons. No hyperdense vessel. Skull: Normal. Negative for fracture or focal lesion. Other: None. CT MAXILLOFACIAL FINDINGS Osseous: The right maxillary cuspid tooth is now protruding through the maxillary roof, new when compared to prior study. No fracture or mandibular dislocation. No destructive process. Orbits: Negative. No traumatic or inflammatory finding. Sinuses: Minimal ethmoid air cell and maxillary sinus mucosal thickening. Remaining paranasal sinuses and mastoid air cells are clear. Soft tissues: Small amount of subcutaneous emphysema and fluid adjacent to the impacted right maxillary cuspid tooth root. CT CERVICAL SPINE FINDINGS Alignment: No traumatic malalignment. Unchanged trace anterolisthesis at C2-C3, C7-T1, T1-T2, and T2-T3. Skull base and vertebrae: No acute fracture. No primary bone lesion or focal pathologic process. Soft tissues and spinal canal: No prevertebral fluid or swelling. No visible canal hematoma. Disc levels: Unchanged moderate to severe multilevel cervical spondylosis from C3-C4 through C6-C7. Upper chest: Paraseptal and centrilobular emphysema. Other: Bilateral carotid artery calcific atherosclerosis. IMPRESSION: CT head: 1.  No acute intracranial abnormality. 2. Stable moderate atrophy and mild chronic microvascular ischemic changes. CT maxillofacial: 1. New impaction of the right maxillary cuspid tooth with protrusion of the root through the maxillary roof. 2.  No acute maxillofacial fracture. CT cervical spine: 1.  No acute cervical spine fracture. 2. Unchanged moderate to severe multilevel cervical spondylosis. 3.  Emphysema (ICD10-J43.9). Electronically Signed    By: Titus Dubin M.D.   On: 12/01/2018 11:51   Mr Brain Wo Contrast  Result Date: 12/01/2018 CLINICAL DATA:  85 year old male with episode of dizziness and fall at home. History of alcoholism. EXAM: MRI HEAD WITHOUT CONTRAST TECHNIQUE: Multiplanar, multiecho pulse sequences of the brain and surrounding structures were obtained without intravenous contrast. COMPARISON:  Head CTs 1114 hours today and earlier. FINDINGS: Brain: No restricted diffusion or evidence of acute infarction. But there is a small extra-axial space-occupying lesion along the superior right frontal convexity with conspicuous diffusion signal seen on series 3, image 42, series 7, image 24. This is subtle on T2 imaging but visible on FLAIR imaging and measuring about 14 millimeters diameter by 4 millimeters in thickness (series 5, image  21). In retrospect there is subtle evidence of this lesion on prior CTs (isodense) since at least August 2019, and stable since that time (series 8, image 36 on the study earlier today). No associated mass effect on the nearby right superior and middle frontal gyri. Cerebral volume loss with chronic ventriculomegaly which may be ex vacuo in nature. Volume loss appears generalized, with no disproportionate areas of cerebral atrophy identified. Mild for age nonspecific periventricular white matter T2 and FLAIR hyperintensity, most pronounced in the atria and temporal white matter. There is a chronic microhemorrhage in the left medulla on series 4, image 12. No other chronic cerebral blood products. No cortical encephalomalacia. No midline shift, mass effect, ventriculomegaly, extra-axial fluid collection or acute intracranial hemorrhage. Cervicomedullary junction and pituitary are within normal limits. Vascular: Major intracranial vascular flow voids are preserved. Skull and upper cervical spine: Visualized bone marrow signal is within normal limits. Sinuses/Orbits: Negative orbits, postoperative changes to  both globes. Paranasal Visualized paranasal sinuses and mastoids are stable and well pneumatized. Other: Visible internal auditory structures appear normal. Scalp and face soft tissues appear negative. IMPRESSION: 1. A small 14 x 4 mm right superior frontal convexity meningioma is suspected and probably clinically silent. 2. No acute intracranial abnormality. Chronic cerebral volume loss and ventricular enlargement which is probably ex vacuo in nature. Electronically Signed   By: Genevie Ann M.D.   On: 12/01/2018 17:06   Dg Hand Complete Right  Result Date: 12/01/2018 CLINICAL DATA:  Right hand pain. Fell today. EXAM: RIGHT HAND - COMPLETE 3+ VIEW COMPARISON:  None. FINDINGS: No evidence of fracture or dislocation. Mild inter phalangeal joint osteoarthritis. IMPRESSION: No acute or traumatic finding. Mild osteoarthritis. Electronically Signed   By: Nelson Chimes M.D.   On: 12/01/2018 13:23   Ct Maxillofacial Wo Contrast  Result Date: 12/01/2018 CLINICAL DATA:  Fall.  Left jaw pain. EXAM: CT HEAD WITHOUT CONTRAST CT MAXILLOFACIAL WITHOUT CONTRAST CT CERVICAL SPINE WITHOUT CONTRAST TECHNIQUE: Multidetector CT imaging of the head, cervical spine, and maxillofacial structures were performed using the standard protocol without intravenous contrast. Multiplanar CT image reconstructions of the cervical spine and maxillofacial structures were also generated. COMPARISON:  CT head and cervical spine dated November 19, 2018. FINDINGS: CT HEAD FINDINGS Brain: No evidence of acute infarction, hemorrhage, hydrocephalus, extra-axial collection or mass lesion/mass effect. Stable moderate atrophy and mild chronic microvascular ischemic changes. Vascular: Atherosclerotic vascular calcification of the carotid siphons. No hyperdense vessel. Skull: Normal. Negative for fracture or focal lesion. Other: None. CT MAXILLOFACIAL FINDINGS Osseous: The right maxillary cuspid tooth is now protruding through the maxillary roof, new when  compared to prior study. No fracture or mandibular dislocation. No destructive process. Orbits: Negative. No traumatic or inflammatory finding. Sinuses: Minimal ethmoid air cell and maxillary sinus mucosal thickening. Remaining paranasal sinuses and mastoid air cells are clear. Soft tissues: Small amount of subcutaneous emphysema and fluid adjacent to the impacted right maxillary cuspid tooth root. CT CERVICAL SPINE FINDINGS Alignment: No traumatic malalignment. Unchanged trace anterolisthesis at C2-C3, C7-T1, T1-T2, and T2-T3. Skull base and vertebrae: No acute fracture. No primary bone lesion or focal pathologic process. Soft tissues and spinal canal: No prevertebral fluid or swelling. No visible canal hematoma. Disc levels: Unchanged moderate to severe multilevel cervical spondylosis from C3-C4 through C6-C7. Upper chest: Paraseptal and centrilobular emphysema. Other: Bilateral carotid artery calcific atherosclerosis. IMPRESSION: CT head: 1.  No acute intracranial abnormality. 2. Stable moderate atrophy and mild chronic microvascular ischemic changes. CT maxillofacial: 1. New impaction  of the right maxillary cuspid tooth with protrusion of the root through the maxillary roof. 2.  No acute maxillofacial fracture. CT cervical spine: 1.  No acute cervical spine fracture. 2. Unchanged moderate to severe multilevel cervical spondylosis. 3.  Emphysema (ICD10-J43.9). Electronically Signed   By: Titus Dubin M.D.   On: 12/01/2018 11:51    EKG: Not performed.  Alcohol dependence, hypertension,  Assessment/Plan   1. Ataxia  - Presents after a fall at home, found to be intoxicated with alcohol, continued to have ataxia after several hours, had MRI brain with no acute findings, and neurology consultant advised likely secondary to alcoholism and recommended high-dose thiamine  - B12 and TSH levels pending  - Continue high-dose thiamine, consult with PT, encourage abstinence from alcohol   2. Alcohol dependence    - Long history of excessive alcohol use, EtOH level 196 in ED  - Supplement B-vitamins, monitor with CIWA, use Ativan as-needed   3. Hypertension  - BP at goal, continue amlodipine-benazepril    DVT prophylaxis: SCD's  Code Status: DNR  Family Communication: Discussed with patient  Consults called: None Admission status: Observation     Victor Bulls, MD [facility] Pager [phone]  If 7PM-7AM, please contact night-coverage [email or web address] Password [facility]  12/01/2018, 9:28 PM

## 2018-12-01 NOTE — ED Notes (Signed)
Wounds irrigated with NS. 

## 2018-12-02 ENCOUNTER — Encounter (HOSPITAL_COMMUNITY): Payer: Self-pay | Admitting: General Practice

## 2018-12-02 ENCOUNTER — Other Ambulatory Visit: Payer: Self-pay

## 2018-12-02 DIAGNOSIS — F10239 Alcohol dependence with withdrawal, unspecified: Secondary | ICD-10-CM | POA: Diagnosis present

## 2018-12-02 DIAGNOSIS — Z23 Encounter for immunization: Secondary | ICD-10-CM | POA: Diagnosis present

## 2018-12-02 DIAGNOSIS — F10220 Alcohol dependence with intoxication, uncomplicated: Secondary | ICD-10-CM | POA: Diagnosis present

## 2018-12-02 DIAGNOSIS — Z823 Family history of stroke: Secondary | ICD-10-CM | POA: Diagnosis not present

## 2018-12-02 DIAGNOSIS — Y906 Blood alcohol level of 120-199 mg/100 ml: Secondary | ICD-10-CM | POA: Diagnosis not present

## 2018-12-02 DIAGNOSIS — R270 Ataxia, unspecified: Secondary | ICD-10-CM

## 2018-12-02 DIAGNOSIS — S01511A Laceration without foreign body of lip, initial encounter: Secondary | ICD-10-CM | POA: Diagnosis present

## 2018-12-02 DIAGNOSIS — J449 Chronic obstructive pulmonary disease, unspecified: Secondary | ICD-10-CM | POA: Diagnosis present

## 2018-12-02 DIAGNOSIS — Z87891 Personal history of nicotine dependence: Secondary | ICD-10-CM | POA: Diagnosis not present

## 2018-12-02 DIAGNOSIS — R21 Rash and other nonspecific skin eruption: Secondary | ICD-10-CM | POA: Diagnosis not present

## 2018-12-02 DIAGNOSIS — Z8249 Family history of ischemic heart disease and other diseases of the circulatory system: Secondary | ICD-10-CM | POA: Diagnosis not present

## 2018-12-02 DIAGNOSIS — S0181XA Laceration without foreign body of other part of head, initial encounter: Secondary | ICD-10-CM | POA: Diagnosis present

## 2018-12-02 DIAGNOSIS — M2763 Post-osseointegration mechanical failure of dental implant: Secondary | ICD-10-CM | POA: Diagnosis present

## 2018-12-02 DIAGNOSIS — Z86010 Personal history of colonic polyps: Secondary | ICD-10-CM | POA: Diagnosis not present

## 2018-12-02 DIAGNOSIS — Z79899 Other long term (current) drug therapy: Secondary | ICD-10-CM | POA: Diagnosis not present

## 2018-12-02 DIAGNOSIS — Y92009 Unspecified place in unspecified non-institutional (private) residence as the place of occurrence of the external cause: Secondary | ICD-10-CM | POA: Diagnosis not present

## 2018-12-02 DIAGNOSIS — R42 Dizziness and giddiness: Secondary | ICD-10-CM | POA: Diagnosis present

## 2018-12-02 DIAGNOSIS — E518 Other manifestations of thiamine deficiency: Secondary | ICD-10-CM | POA: Diagnosis present

## 2018-12-02 DIAGNOSIS — Z7982 Long term (current) use of aspirin: Secondary | ICD-10-CM | POA: Diagnosis not present

## 2018-12-02 DIAGNOSIS — W19XXXA Unspecified fall, initial encounter: Secondary | ICD-10-CM | POA: Diagnosis not present

## 2018-12-02 DIAGNOSIS — I10 Essential (primary) hypertension: Secondary | ICD-10-CM | POA: Diagnosis present

## 2018-12-02 DIAGNOSIS — Z66 Do not resuscitate: Secondary | ICD-10-CM | POA: Diagnosis present

## 2018-12-02 LAB — COMPREHENSIVE METABOLIC PANEL
ALT: 36 U/L (ref 0–44)
AST: 49 U/L — ABNORMAL HIGH (ref 15–41)
Albumin: 3.1 g/dL — ABNORMAL LOW (ref 3.5–5.0)
Alkaline Phosphatase: 39 U/L (ref 38–126)
Anion gap: 11 (ref 5–15)
BUN: 17 mg/dL (ref 8–23)
CO2: 22 mmol/L (ref 22–32)
Calcium: 8.6 mg/dL — ABNORMAL LOW (ref 8.9–10.3)
Chloride: 110 mmol/L (ref 98–111)
Creatinine, Ser: 1 mg/dL (ref 0.61–1.24)
GFR calc Af Amer: >60 mL/min (ref >60)
GFR calc non Af Amer: >60 mL/min (ref >60)
Glucose, Bld: 114 mg/dL — ABNORMAL HIGH (ref 70–99)
Potassium: 4 mmol/L (ref 3.5–5.1)
Sodium: 143 mmol/L (ref 135–145)
TOTAL PROTEIN: 5.7 g/dL — AB (ref 6.5–8.1)
Total Bilirubin: 1.3 mg/dL — ABNORMAL HIGH (ref 0.3–1.2)

## 2018-12-02 MED ORDER — INFLUENZA VAC SPLIT HIGH-DOSE 0.5 ML IM SUSY
0.5000 mL | PREFILLED_SYRINGE | INTRAMUSCULAR | Status: DC
  Filled 2018-12-02: qty 0.5

## 2018-12-02 NOTE — Plan of Care (Signed)

## 2018-12-02 NOTE — Evaluation (Addendum)
Physical Therapy Evaluation Patient Details Name: Victor Clarke MRN: [id number] DOB: March 09, 1929 Today's Date: 12/02/2018   History of Present Illness  Pt is an 85 y/o male admitted secondary to sustaining a fall at home. Pt found to be intoxicated upon arrival. Of note, pt's MRI of his brain revealed a small 14 x 4 mm right superior frontal convexity meningioma that is probably clinically silent. PMH including but not limited to Parkinson's, HTN, COPD and alcohol dependence.     Clinical Impression  Pt presented sitting on BSC following having a BM with RN present and willing to participate in therapy session. Prior to admission, pt reported that he ambulates with use of a cane and is independent with ADLs. Pt reported that he lives alone and has "no one" to assist him if needed. Pt currently limited secondary to fatigue, poor safety awareness and balance and coordination deficits. He requires min-mod A for transfers and supervision for bed mobility. Pt on RA throughout with SPO2 maintaining at >95%. Pt would continue to benefit from skilled physical therapy services at this time while admitted and after d/c to address the below listed limitations in order to improve overall safety and independence with functional mobility.     Follow Up Recommendations SNF    Equipment Recommendations  None recommended by PT    Recommendations for Other Services       Precautions / Restrictions Precautions Precautions: Fall Restrictions Weight Bearing Restrictions: No      Mobility  Bed Mobility Overal bed mobility: Needs Assistance Bed Mobility: Sit to Supine       Sit to supine: Supervision   General bed mobility comments: supervision for safety  Transfers Overall transfer level: Needs assistance Equipment used: Rolling walker (2 wheeled) Transfers: Sit to/from [facility] Sit to Stand: Mod assist Stand pivot transfers: Min assist;+2 physical assistance;+2  safety/equipment       General transfer comment: pt required cueing for safe hand placement, assist to power into standing from [facility] and assistance for stability with pivot from [facility] to bed.   Ambulation/Gait             General Gait Details: deferred secondary to pt with significant fatigue following BM and transfers  Stairs            Wheelchair Mobility    Modified Rankin (Stroke Patients Only)       Balance Overall balance assessment: Needs assistance Sitting-balance support: Feet supported Sitting balance-Leahy Scale: Fair Sitting balance - Comments: pt able to sit EOB with and without UE supports with supervision for safety   Standing balance support: Single extremity supported;Bilateral upper extremity supported Standing balance-Leahy Scale: Poor Standing balance comment: min A for stability and safety as pt impulsive and unsafe with RW                             Pertinent Vitals/Pain Pain Assessment: No/denies pain    Home Living Family/patient expects to be discharged to:: Private residence Living Arrangements: Alone Available Help at Discharge: Other (Comment)(pt reporting "no one") Type of Home: House Home Access: Stairs to enter Entrance Stairs-Rails: Psychiatric nurse of Steps: 3 Home Layout: One level Home Equipment: Cane - single point      Prior Function Level of Independence: Independent with assistive device(s)         Comments: pt reported that he ambulates with use of a cane     Hand Dominance  Extremity/Trunk Assessment   Upper Extremity Assessment Upper Extremity Assessment: Generalized weakness    Lower Extremity Assessment Lower Extremity Assessment: Generalized weakness       Communication   Communication: No difficulties  Cognition Arousal/Alertness: Awake/alert Behavior During Therapy: Impulsive Overall Cognitive Status: Impaired/Different from baseline Area of Impairment:  Safety/judgement;Problem solving                         Safety/Judgement: Decreased awareness of deficits;Decreased awareness of safety   Problem Solving: Difficulty sequencing;Requires verbal cues        General Comments      Exercises     Assessment/Plan    PT Assessment Patient needs continued PT services  PT Problem List Decreased strength;Decreased activity tolerance;Decreased balance;Decreased mobility;Decreased coordination;Decreased cognition;Decreased knowledge of use of DME;Decreased safety awareness       PT Treatment Interventions DME instruction;Gait training;Stair training;Functional mobility training;Therapeutic activities;Therapeutic exercise;Balance training;Neuromuscular re-education;Cognitive remediation;Patient/family education    PT Goals (Current goals can be found in the Care Plan section)  Acute Rehab PT Goals Patient Stated Goal: none stated PT Goal Formulation: With patient Time For Goal Achievement: 12/16/18 Potential to Achieve Goals: Fair    Frequency Min 2X/week   Barriers to discharge Decreased caregiver support      Co-evaluation               AM-PAC PT "6 Clicks" Mobility  Outcome Measure Help needed turning from your back to your side while in a flat bed without using bedrails?: None Help needed moving from lying on your back to sitting on the side of a flat bed without using bedrails?: None Help needed moving to and from a bed to a chair (including a wheelchair)?: A Little Help needed standing up from a chair using your arms (e.g., wheelchair or bedside chair)?: A Lot Help needed to walk in hospital room?: A Lot Help needed climbing 3-5 steps with a railing? : A Lot 6 Click Score: 17    End of Session   Activity Tolerance: Patient limited by fatigue Patient left: in bed;with call bell/phone within reach;with bed alarm set;with SCD's reapplied Nurse Communication: Mobility status PT Visit Diagnosis: Other  abnormalities of gait and mobility (R26.89);Repeated falls (R29.6)    Time: 0321-2248 PT Time Calculation (min) (ACUTE ONLY): 11 min   Charges:   PT Evaluation $PT Eval Moderate Complexity: 1 Mod          Sherie Don, PT, DPT  Acute Rehabilitation Services Pager [phone] [place] 12/02/2018, 11:12 AM

## 2018-12-02 NOTE — Progress Notes (Signed)
PROGRESS NOTE    Victor Clarke  ALP:[id number] DOB: Mar 12, 1929 DOA: 12/01/2018 PCP: Sherald Hess., MD   Brief Narrative: Patient is a 85 year old male with history of hypertension, alcohol dependence, COPD who presented to the emergency department after a fall at home.  Ethanol level was elevated to 196.Found to be ataxic on presentation.  Gait difficulty suspected to be from alcoholism and thiamine deficiency.  Currently managing for ataxia, altered mental status, alcohol withdrawal.  Assessment & Plan:   Principal Problem:   Ataxia Active Problems:   Hypertension   Problematic consumption of alcohol  Ataxia: Suspected from  with alcohol intoxication/thiamine deficiency.  MRI of the brain did not show any acute intracranial findings. Started on high-dose thiamine supplementation. TSH and vitamin B-12 level normal.  Fall: Reported to have fallen at home.  Lacerated his chin,lip.  PT evaluated him and recommended skilled services on discharge.  Social worker consulted.  Alcohol dependence: Long history of alcohol abuse.  Intoxicated on presentation.  Continue thiamine and folic acid.  Continue to monitor CIWA.  Continue Ativan as needed for withdrawal.  Hypertension: Currently above stable.  Continue amlodipine-benazepril.          DVT prophylaxis: SCD Code Status:Full  Family Communication: None present at the bedside Disposition Plan: Skilled nursing facility when mental status improves   Consultants: None  Procedures: None  Antimicrobials: None  Subjective: Patient seen and examined the bedside in the afternoon.  Currently hemodynamically stable.  Not alert or oriented.  Objective: Vitals:   12/01/18 2338 12/02/18 0344 12/02/18 0735 12/02/18 1111  BP: (!) 146/75 137/81 (!) 124/92 139/71  Pulse: 76 79 82 87  Resp: 20 18    Temp: 98 F (36.7 C) 98.1 F (36.7 C) 98 F (36.7 C) 98 F (36.7 C)  TempSrc: Oral Oral Oral Oral  SpO2: 100% 99% 97% 100%    Weight:      Height:        Intake/Output Summary (Last 24 hours) at 12/02/2018 1300 Last data filed at 12/01/2018 1755 Gross per 24 hour  Intake 1000 ml  Output -  Net 1000 ml   Filed Weights   12/01/18 2158  Weight: 62 kg    Examination:  General exam: Generalized weakness HEENT:PERRL,Oral mucosa moist, Ear/Nose normal on gross exam.  Bruise on the face Respiratory system: Bilateral equal air entry, normal vesicular breath sounds, no wheezes or crackles  Cardiovascular system: S1 & S2 heard, RRR. No JVD, murmurs, rubs, gallops or clicks. No pedal edema. Gastrointestinal system: Abdomen is nondistended, soft and nontender. No organomegaly or masses felt. Normal bowel sounds heard. Central nervous system: Not Alert and oriented.  Extremities: No edema, no clubbing ,no cyanosis, distal peripheral pulses palpable.   Skin: Scattered ecchymosis ,no icterus ,no pallor      Data Reviewed: I have personally reviewed following labs and imaging studies  CBC: Recent Labs  Lab 12/01/18 1059  WBC 6.6  NEUTROABS 5.0  HGB 12.8*  HCT 39.0  MCV 106.6*  PLT 353   Basic Metabolic Panel: Recent Labs  Lab 12/01/18 1059 12/02/18 0452  NA 144 143  K 3.7 4.0  CL 108 110  CO2 20* 22  GLUCOSE 89 114*  BUN 17 17  CREATININE 1.02 1.00  CALCIUM 9.4 8.6*   GFR: Estimated Creatinine Clearance: 45.6 mL/min (by C-G formula based on SCr of 1 mg/dL). Liver Function Tests: Recent Labs  Lab 12/01/18 1059 12/02/18 0452  AST 70* 49*  ALT  46* 36  ALKPHOS 41 39  BILITOT 0.9 1.3*  PROT 6.4* 5.7*  ALBUMIN 3.6 3.1*   No results for input(s): LIPASE, AMYLASE in the last 168 hours. No results for input(s): AMMONIA in the last 168 hours. Coagulation Profile: No results for input(s): INR, PROTIME in the last 168 hours. Cardiac Enzymes: Recent Labs  Lab 12/01/18 1600  CKTOTAL 529*   BNP (last 3 results) No results for input(s): PROBNP in the last 8760 hours. HbA1C: Recent Labs     12/01/18 2011  HGBA1C 5.4   CBG: No results for input(s): GLUCAP in the last 168 hours. Lipid Profile: No results for input(s): CHOL, HDL, LDLCALC, TRIG, CHOLHDL, LDLDIRECT in the last 72 hours. Thyroid Function Tests: Recent Labs    12/01/18 2011  TSH 0.587   Anemia Panel: Recent Labs    12/01/18 2011  VITAMINB12 363   Sepsis Labs: No results for input(s): PROCALCITON, LATICACIDVEN in the last 168 hours.  No results found for this or any previous visit (from the past 240 hour(s)).       Radiology Studies: Dg Chest 2 View  Result Date: 12/01/2018 CLINICAL DATA:  85 year old who fell this morning and injured his face. Current history of atrial fibrillation. EXAM: CHEST - 2 VIEW COMPARISON:  11/19/2018 and earlier, including CTA chest 04/30/2018. FINDINGS: AP ERECT and LATERAL images were obtained. Suboptimal inspiration which accounts for atelectasis in the lower lobes and RIGHT MIDDLE LOBE. Cardiac silhouette moderately enlarged, unchanged. Thoracic aorta atherosclerotic, unchanged. Hilar and mediastinal contours otherwise unremarkable. Chronic interstitial lung disease with associated low lung volumes, unchanged. Lungs otherwise clear. No confluent airspace consolidation. No pleural effusions. Visualized bony thorax intact. IMPRESSION: 1. Suboptimal inspiration accounts for atelectasis in the lower lobes and RIGHT middle lobe. No acute cardiopulmonary disease otherwise. 2. Stable cardiomegaly without pulmonary edema. 3. Chronic interstitial lung disease, likely pulmonary fibrosis. Electronically Signed   By: Evangeline Dakin M.D.   On: 12/01/2018 11:15   Ct Head Wo Contrast  Result Date: 12/01/2018 CLINICAL DATA:  Fall.  Left jaw pain. EXAM: CT HEAD WITHOUT CONTRAST CT MAXILLOFACIAL WITHOUT CONTRAST CT CERVICAL SPINE WITHOUT CONTRAST TECHNIQUE: Multidetector CT imaging of the head, cervical spine, and maxillofacial structures were performed using the standard protocol without  intravenous contrast. Multiplanar CT image reconstructions of the cervical spine and maxillofacial structures were also generated. COMPARISON:  CT head and cervical spine dated November 19, 2018. FINDINGS: CT HEAD FINDINGS Brain: No evidence of acute infarction, hemorrhage, hydrocephalus, extra-axial collection or mass lesion/mass effect. Stable moderate atrophy and mild chronic microvascular ischemic changes. Vascular: Atherosclerotic vascular calcification of the carotid siphons. No hyperdense vessel. Skull: Normal. Negative for fracture or focal lesion. Other: None. CT MAXILLOFACIAL FINDINGS Osseous: The right maxillary cuspid tooth is now protruding through the maxillary roof, new when compared to prior study. No fracture or mandibular dislocation. No destructive process. Orbits: Negative. No traumatic or inflammatory finding. Sinuses: Minimal ethmoid air cell and maxillary sinus mucosal thickening. Remaining paranasal sinuses and mastoid air cells are clear. Soft tissues: Small amount of subcutaneous emphysema and fluid adjacent to the impacted right maxillary cuspid tooth root. CT CERVICAL SPINE FINDINGS Alignment: No traumatic malalignment. Unchanged trace anterolisthesis at C2-C3, C7-T1, T1-T2, and T2-T3. Skull base and vertebrae: No acute fracture. No primary bone lesion or focal pathologic process. Soft tissues and spinal canal: No prevertebral fluid or swelling. No visible canal hematoma. Disc levels: Unchanged moderate to severe multilevel cervical spondylosis from C3-C4 through C6-C7. Upper chest: Paraseptal and  centrilobular emphysema. Other: Bilateral carotid artery calcific atherosclerosis. IMPRESSION: CT head: 1.  No acute intracranial abnormality. 2. Stable moderate atrophy and mild chronic microvascular ischemic changes. CT maxillofacial: 1. New impaction of the right maxillary cuspid tooth with protrusion of the root through the maxillary roof. 2.  No acute maxillofacial fracture. CT cervical  spine: 1.  No acute cervical spine fracture. 2. Unchanged moderate to severe multilevel cervical spondylosis. 3.  Emphysema (ICD10-J43.9). Electronically Signed   By: Titus Dubin M.D.   On: 12/01/2018 11:51   Ct Cervical Spine Wo Contrast  Result Date: 12/01/2018 CLINICAL DATA:  Fall.  Left jaw pain. EXAM: CT HEAD WITHOUT CONTRAST CT MAXILLOFACIAL WITHOUT CONTRAST CT CERVICAL SPINE WITHOUT CONTRAST TECHNIQUE: Multidetector CT imaging of the head, cervical spine, and maxillofacial structures were performed using the standard protocol without intravenous contrast. Multiplanar CT image reconstructions of the cervical spine and maxillofacial structures were also generated. COMPARISON:  CT head and cervical spine dated November 19, 2018. FINDINGS: CT HEAD FINDINGS Brain: No evidence of acute infarction, hemorrhage, hydrocephalus, extra-axial collection or mass lesion/mass effect. Stable moderate atrophy and mild chronic microvascular ischemic changes. Vascular: Atherosclerotic vascular calcification of the carotid siphons. No hyperdense vessel. Skull: Normal. Negative for fracture or focal lesion. Other: None. CT MAXILLOFACIAL FINDINGS Osseous: The right maxillary cuspid tooth is now protruding through the maxillary roof, new when compared to prior study. No fracture or mandibular dislocation. No destructive process. Orbits: Negative. No traumatic or inflammatory finding. Sinuses: Minimal ethmoid air cell and maxillary sinus mucosal thickening. Remaining paranasal sinuses and mastoid air cells are clear. Soft tissues: Small amount of subcutaneous emphysema and fluid adjacent to the impacted right maxillary cuspid tooth root. CT CERVICAL SPINE FINDINGS Alignment: No traumatic malalignment. Unchanged trace anterolisthesis at C2-C3, C7-T1, T1-T2, and T2-T3. Skull base and vertebrae: No acute fracture. No primary bone lesion or focal pathologic process. Soft tissues and spinal canal: No prevertebral fluid or swelling.  No visible canal hematoma. Disc levels: Unchanged moderate to severe multilevel cervical spondylosis from C3-C4 through C6-C7. Upper chest: Paraseptal and centrilobular emphysema. Other: Bilateral carotid artery calcific atherosclerosis. IMPRESSION: CT head: 1.  No acute intracranial abnormality. 2. Stable moderate atrophy and mild chronic microvascular ischemic changes. CT maxillofacial: 1. New impaction of the right maxillary cuspid tooth with protrusion of the root through the maxillary roof. 2.  No acute maxillofacial fracture. CT cervical spine: 1.  No acute cervical spine fracture. 2. Unchanged moderate to severe multilevel cervical spondylosis. 3.  Emphysema (ICD10-J43.9). Electronically Signed   By: Titus Dubin M.D.   On: 12/01/2018 11:51   Mr Brain Wo Contrast  Result Date: 12/01/2018 CLINICAL DATA:  85 year old male with episode of dizziness and fall at home. History of alcoholism. EXAM: MRI HEAD WITHOUT CONTRAST TECHNIQUE: Multiplanar, multiecho pulse sequences of the brain and surrounding structures were obtained without intravenous contrast. COMPARISON:  Head CTs 1114 hours today and earlier. FINDINGS: Brain: No restricted diffusion or evidence of acute infarction. But there is a small extra-axial space-occupying lesion along the superior right frontal convexity with conspicuous diffusion signal seen on series 3, image 42, series 7, image 24. This is subtle on T2 imaging but visible on FLAIR imaging and measuring about 14 millimeters diameter by 4 millimeters in thickness (series 5, image 21). In retrospect there is subtle evidence of this lesion on prior CTs (isodense) since at least August 2019, and stable since that time (series 8, image 36 on the study earlier today). No associated mass effect  on the nearby right superior and middle frontal gyri. Cerebral volume loss with chronic ventriculomegaly which may be ex vacuo in nature. Volume loss appears generalized, with no disproportionate areas  of cerebral atrophy identified. Mild for age nonspecific periventricular white matter T2 and FLAIR hyperintensity, most pronounced in the atria and temporal white matter. There is a chronic microhemorrhage in the left medulla on series 4, image 12. No other chronic cerebral blood products. No cortical encephalomalacia. No midline shift, mass effect, ventriculomegaly, extra-axial fluid collection or acute intracranial hemorrhage. Cervicomedullary junction and pituitary are within normal limits. Vascular: Major intracranial vascular flow voids are preserved. Skull and upper cervical spine: Visualized bone marrow signal is within normal limits. Sinuses/Orbits: Negative orbits, postoperative changes to both globes. Paranasal Visualized paranasal sinuses and mastoids are stable and well pneumatized. Other: Visible internal auditory structures appear normal. Scalp and face soft tissues appear negative. IMPRESSION: 1. A small 14 x 4 mm right superior frontal convexity meningioma is suspected and probably clinically silent. 2. No acute intracranial abnormality. Chronic cerebral volume loss and ventricular enlargement which is probably ex vacuo in nature. Electronically Signed   By: Genevie Ann M.D.   On: 12/01/2018 17:06   Dg Shoulder Right Port  Result Date: 12/01/2018 CLINICAL DATA:  85 year old male status post fall with right shoulder pain. EXAM: PORTABLE RIGHT SHOULDER COMPARISON:  Chest radiographs earlier today. FINDINGS: There is no evidence of fracture or dislocation. No age advanced arthropathy or other focal bone abnormality. Calcified right subclavian/axillary region atherosclerosis. Negative visible right ribs and lung parenchyma. IMPRESSION: No acute fracture or dislocation identified about the right shoulder. Electronically Signed   By: Genevie Ann M.D.   On: 12/01/2018 22:50   Dg Hand Complete Right  Result Date: 12/01/2018 CLINICAL DATA:  Right hand pain. Fell today. EXAM: RIGHT HAND - COMPLETE 3+ VIEW  COMPARISON:  None. FINDINGS: No evidence of fracture or dislocation. Mild inter phalangeal joint osteoarthritis. IMPRESSION: No acute or traumatic finding. Mild osteoarthritis. Electronically Signed   By: Nelson Chimes M.D.   On: 12/01/2018 13:23   Ct Maxillofacial Wo Contrast  Result Date: 12/01/2018 CLINICAL DATA:  Fall.  Left jaw pain. EXAM: CT HEAD WITHOUT CONTRAST CT MAXILLOFACIAL WITHOUT CONTRAST CT CERVICAL SPINE WITHOUT CONTRAST TECHNIQUE: Multidetector CT imaging of the head, cervical spine, and maxillofacial structures were performed using the standard protocol without intravenous contrast. Multiplanar CT image reconstructions of the cervical spine and maxillofacial structures were also generated. COMPARISON:  CT head and cervical spine dated November 19, 2018. FINDINGS: CT HEAD FINDINGS Brain: No evidence of acute infarction, hemorrhage, hydrocephalus, extra-axial collection or mass lesion/mass effect. Stable moderate atrophy and mild chronic microvascular ischemic changes. Vascular: Atherosclerotic vascular calcification of the carotid siphons. No hyperdense vessel. Skull: Normal. Negative for fracture or focal lesion. Other: None. CT MAXILLOFACIAL FINDINGS Osseous: The right maxillary cuspid tooth is now protruding through the maxillary roof, new when compared to prior study. No fracture or mandibular dislocation. No destructive process. Orbits: Negative. No traumatic or inflammatory finding. Sinuses: Minimal ethmoid air cell and maxillary sinus mucosal thickening. Remaining paranasal sinuses and mastoid air cells are clear. Soft tissues: Small amount of subcutaneous emphysema and fluid adjacent to the impacted right maxillary cuspid tooth root. CT CERVICAL SPINE FINDINGS Alignment: No traumatic malalignment. Unchanged trace anterolisthesis at C2-C3, C7-T1, T1-T2, and T2-T3. Skull base and vertebrae: No acute fracture. No primary bone lesion or focal pathologic process. Soft tissues and spinal canal:  No prevertebral fluid or swelling. No visible  canal hematoma. Disc levels: Unchanged moderate to severe multilevel cervical spondylosis from C3-C4 through C6-C7. Upper chest: Paraseptal and centrilobular emphysema. Other: Bilateral carotid artery calcific atherosclerosis. IMPRESSION: CT head: 1.  No acute intracranial abnormality. 2. Stable moderate atrophy and mild chronic microvascular ischemic changes. CT maxillofacial: 1. New impaction of the right maxillary cuspid tooth with protrusion of the root through the maxillary roof. 2.  No acute maxillofacial fracture. CT cervical spine: 1.  No acute cervical spine fracture. 2. Unchanged moderate to severe multilevel cervical spondylosis. 3.  Emphysema (ICD10-J43.9). Electronically Signed   By: Titus Dubin M.D.   On: 12/01/2018 11:51        Scheduled Meds: . amLODipine  5 mg Oral Daily   Or  . benazepril  20 mg Oral Daily  . aspirin EC  81 mg Oral Q M,W,F  . feeding supplement (ENSURE ENLIVE)  237 mL Oral BID  . folic acid  1 mg Oral Daily  . [START ON 12/03/2018] Influenza vac split quadrivalent PF  0.5 mL Intramuscular Tomorrow-1000  . LORazepam  0-4 mg Oral Q6H   Followed by  . [START ON 12/03/2018] LORazepam  0-4 mg Oral Q12H  . magnesium oxide  400 mg Oral Daily  . multivitamin with minerals  1 tablet Oral Daily  . rosuvastatin  20 mg Oral q1800   Continuous Infusions: . [START ON 12/04/2018] thiamine injection    . thiamine injection 500 mg (12/02/18 1054)     LOS: 0 days    Time spent:35 mins. More than 50% of that time was spent in counseling and/or coordination of care.      Shelly Coss, MD [facility] Pager [phone]  If 7PM-7AM, please contact night-coverage [email or web address] Password [email or web address] 12/02/2018, 1:00 PM

## 2018-12-03 LAB — URINE CULTURE

## 2018-12-03 NOTE — Plan of Care (Signed)

## 2018-12-03 NOTE — Progress Notes (Signed)
PROGRESS NOTE    Victor Clarke  FGH:[id number] DOB: [birth year] DOA: 12/01/2018 PCP: Sherald Hess., MD   Brief Narrative: Patient is a 85 year old male with history of hypertension, alcohol dependence, COPD who presented to the emergency department after a fall at home.  Ethanol level was elevated to 196.Found to be ataxic on presentation.  Gait difficulty suspected to be from alcoholism and thiamine deficiency.  Currently managing for ataxia, altered mental status, alcohol withdrawal.  Assessment & Plan:   Principal Problem:   Ataxia Active Problems:   Hypertension   Problematic consumption of alcohol  Ataxia: Suspected from  with alcohol intoxication/thiamine deficiency.  MRI of the brain did not show any acute intracranial findings. Started on high-dose thiamine supplementation. TSH and vitamin B-12 level normal.  Fall: Reported to have fallen at home.  Lacerated his chin,lip.  PT evaluated him and recommended skilled nursing facility on discharge.  Social worker consulted and following  Alcohol dependence: Long history of alcohol abuse.  Intoxicated on presentation.  Continue thiamine and folic acid.    Alcohol withdrawal/Delirium: Agitated this morning.  Continue to monitor CIWA.  Continue Ativan as needed for withdrawal.  Hypertension: Currently  stable.  Continue amlodipine-benazepril.         DVT prophylaxis: SCD Code Status:Full  Family Communication: None present at the bedside Disposition Plan: Skilled nursing facility when mental status improves   Consultants: None  Procedures: None  Antimicrobials: None  Subjective: Patient seen and examined the bedside this morning.  Found to be agitated.  Constantly trying to get out of the bed.  Does not involve in any meaningful conversation  Objective: Vitals:   12/03/18 0758 12/03/18 0854 12/03/18 1206 12/03/18 1258  BP: 121/86 (!) 127/59 126/87 (!) 137/97  Pulse: 67 62 73 79  Resp:  18 18 18   Temp: 98  F (36.7 C) 98.2 F (36.8 C)  98 F (36.7 C)  TempSrc: Oral Axillary  Axillary  SpO2: 100% 100% 98% 98%  Weight:      Height:        Intake/Output Summary (Last 24 hours) at 12/03/2018 1326 Last data filed at 12/03/2018 0600 Gross per 24 hour  Intake 250 ml  Output -  Net 250 ml   Filed Weights   12/01/18 2158  Weight: 62 kg    Examination:  General exam: Agitated  HEENT:PERRL,Oral mucosa moist, Ear/Nose normal on gross exam.  Right periorbital ecchymosis, ecchymosis on the right peri-labial area and chin Respiratory system: Bilateral equal air entry, normal vesicular breath sounds, no wheezes or crackles  Cardiovascular system: S1 & S2 heard, RRR. No JVD, murmurs, rubs, gallops or clicks. No pedal edema. Gastrointestinal system: Abdomen is nondistended, soft and nontender. No organomegaly or masses felt. Normal bowel sounds heard. Central nervous system: Not Alert and oriented.  Extremities: No edema, no clubbing ,no cyanosis, distal peripheral pulses palpable.Bruise on the right upper extremity   Skin: Scattered ecchymosis ,no icterus ,no pallor      Data Reviewed: I have personally reviewed following labs and imaging studies  CBC: Recent Labs  Lab 12/01/18 1059  WBC 6.6  NEUTROABS 5.0  HGB 12.8*  HCT 39.0  MCV 106.6*  PLT 678   Basic Metabolic Panel: Recent Labs  Lab 12/01/18 1059 12/02/18 0452  NA 144 143  K 3.7 4.0  CL 108 110  CO2 20* 22  GLUCOSE 89 114*  BUN 17 17  CREATININE 1.02 1.00  CALCIUM 9.4 8.6*   GFR: Estimated  Creatinine Clearance: 45.6 mL/min (by C-G formula based on SCr of 1 mg/dL). Liver Function Tests: Recent Labs  Lab 12/01/18 1059 12/02/18 0452  AST 70* 49*  ALT 46* 36  ALKPHOS 41 39  BILITOT 0.9 1.3*  PROT 6.4* 5.7*  ALBUMIN 3.6 3.1*   No results for input(s): LIPASE, AMYLASE in the last 168 hours. No results for input(s): AMMONIA in the last 168 hours. Coagulation Profile: No results for input(s): INR, PROTIME in  the last 168 hours. Cardiac Enzymes: Recent Labs  Lab 12/01/18 1600  CKTOTAL 529*   BNP (last 3 results) No results for input(s): PROBNP in the last 8760 hours. HbA1C: Recent Labs    12/01/18 2011  HGBA1C 5.4   CBG: No results for input(s): GLUCAP in the last 168 hours. Lipid Profile: No results for input(s): CHOL, HDL, LDLCALC, TRIG, CHOLHDL, LDLDIRECT in the last 72 hours. Thyroid Function Tests: Recent Labs    12/01/18 2011  TSH 0.587   Anemia Panel: Recent Labs    12/01/18 2011  VITAMINB12 363   Sepsis Labs: No results for input(s): PROCALCITON, LATICACIDVEN in the last 168 hours.  Recent Results (from the past 240 hour(s))  Urine culture     Status: None   Collection Time: 12/01/18  7:40 PM  Result Value Ref Range Status   Specimen Description URINE, CLEAN CATCH  Final   Special Requests   Final    NONE Performed at [facility], [address] [place], [place] [address]    Culture   Final    Multiple bacterial morphotypes present, none predominant. Suggest appropriate recollection if clinically indicated.   Report Status 12/03/2018 FINAL  Final         Radiology Studies: Mr Brain Wo Contrast  Result Date: 12/01/2018 CLINICAL DATA:  85 year old male with episode of dizziness and fall at home. History of alcoholism. EXAM: MRI HEAD WITHOUT CONTRAST TECHNIQUE: Multiplanar, multiecho pulse sequences of the brain and surrounding structures were obtained without intravenous contrast. COMPARISON:  Head CTs 1114 hours today and earlier. FINDINGS: Brain: No restricted diffusion or evidence of acute infarction. But there is a small extra-axial space-occupying lesion along the superior right frontal convexity with conspicuous diffusion signal seen on series 3, image 42, series 7, image 24. This is subtle on T2 imaging but visible on FLAIR imaging and measuring about 14 millimeters diameter by 4 millimeters in thickness (series 5, image 21). In retrospect there  is subtle evidence of this lesion on prior CTs (isodense) since at least August 2019, and stable since that time (series 8, image 36 on the study earlier today). No associated mass effect on the nearby right superior and middle frontal gyri. Cerebral volume loss with chronic ventriculomegaly which may be ex vacuo in nature. Volume loss appears generalized, with no disproportionate areas of cerebral atrophy identified. Mild for age nonspecific periventricular white matter T2 and FLAIR hyperintensity, most pronounced in the atria and temporal white matter. There is a chronic microhemorrhage in the left medulla on series 4, image 12. No other chronic cerebral blood products. No cortical encephalomalacia. No midline shift, mass effect, ventriculomegaly, extra-axial fluid collection or acute intracranial hemorrhage. Cervicomedullary junction and pituitary are within normal limits. Vascular: Major intracranial vascular flow voids are preserved. Skull and upper cervical spine: Visualized bone marrow signal is within normal limits. Sinuses/Orbits: Negative orbits, postoperative changes to both globes. Paranasal Visualized paranasal sinuses and mastoids are stable and well pneumatized. Other: Visible internal auditory structures appear normal. Scalp  and face soft tissues appear negative. IMPRESSION: 1. A small 14 x 4 mm right superior frontal convexity meningioma is suspected and probably clinically silent. 2. No acute intracranial abnormality. Chronic cerebral volume loss and ventricular enlargement which is probably ex vacuo in nature. Electronically Signed   By: Genevie Ann M.D.   On: 12/01/2018 17:06   Dg Shoulder Right Port  Result Date: 12/01/2018 CLINICAL DATA:  85 year old male status post fall with right shoulder pain. EXAM: PORTABLE RIGHT SHOULDER COMPARISON:  Chest radiographs earlier today. FINDINGS: There is no evidence of fracture or dislocation. No age advanced arthropathy or other focal bone abnormality.  Calcified right subclavian/axillary region atherosclerosis. Negative visible right ribs and lung parenchyma. IMPRESSION: No acute fracture or dislocation identified about the right shoulder. Electronically Signed   By: Genevie Ann M.D.   On: 12/01/2018 22:50        Scheduled Meds: . amLODipine  5 mg Oral Daily   Or  . benazepril  20 mg Oral Daily  . aspirin EC  81 mg Oral Q M,W,F  . feeding supplement (ENSURE ENLIVE)  237 mL Oral BID  . folic acid  1 mg Oral Daily  . Influenza vac split quadrivalent PF  0.5 mL Intramuscular Tomorrow-1000  . LORazepam  0-4 mg Oral Q6H   Followed by  . LORazepam  0-4 mg Oral Q12H  . magnesium oxide  400 mg Oral Daily  . multivitamin with minerals  1 tablet Oral Daily  . rosuvastatin  20 mg Oral q1800   Continuous Infusions: . [START ON 12/04/2018] thiamine injection       LOS: 1 day    Time spent:25 mins. More than 50% of that time was spent in counseling and/or coordination of care.      Shelly Coss, MD [facility] Pager [phone]  If 7PM-7AM, please contact night-coverage [email or web address] Password [email or web address] 12/03/2018, 1:26 PM

## 2018-12-04 LAB — GLUCOSE, CAPILLARY: Glucose-Capillary: 97 mg/dL (ref 70–99)

## 2018-12-04 MED ORDER — WHITE PETROLATUM EX OINT
TOPICAL_OINTMENT | CUTANEOUS | Status: AC
  Administered 2018-12-04: 0.2
  Filled 2018-12-04: qty 28.35

## 2018-12-04 NOTE — Care Management Important Message (Signed)
Important Message  Patient Details  Name: Victor Clarke MRN: [id number] Date of Birth: [birth year]   Medicare Important Message Given:  Yes    [name] 12/04/2018, 1:45 PM

## 2018-12-04 NOTE — Progress Notes (Signed)
PROGRESS NOTE    Victor Clarke  JXB:[id number] DOB: July 11, 1929 DOA: 12/01/2018 PCP: Sherald Hess., MD   Brief Narrative: Patient is a 85 year old male with history of hypertension, alcohol dependence, COPD who presented to the emergency department after a fall at home.  Ethanol level was elevated to 196.Found to be ataxic on presentation.  Gait difficulty suspected to be from alcoholism and thiamine deficiency.  Currently managing for ataxia, altered mental status, alcohol withdrawal.  Assessment & Plan:   Principal Problem:   Ataxia Active Problems:   Hypertension   Problematic consumption of alcohol  Ataxia: Suspected from  with alcohol intoxication/thiamine deficiency.  MRI of the brain did not show any acute intracranial findings. Started on high-dose thiamine supplementation.Continue thiamine. TSH and vitamin B-12 level normal.  Fall: Reported to have fallen at home.  Lacerated his chin,lip.  PT evaluated him and recommended skilled nursing facility on discharge.  Social worker consulted and following  Alcohol dependence: Long history of alcohol abuse.  Intoxicated on presentation.  Continue thiamine and folic acid.    Alcohol withdrawal/Delirium: Continued agitated this morning. Was given ativan last night. Continue to monitor CIWA.  Continue Ativan as needed for withdrawal.  Hypertension: Currently  stable.  Continue amlodipine-benazepril.         DVT prophylaxis: SCD Code Status:Full  Family Communication: None present at the bedside Disposition Plan: Skilled nursing facility when mental status improves   Consultants: None  Procedures: None  Antimicrobials: None  Subjective: Patient seen and examined the bedside this morning.  Remains in altered mental status. Looks agitated .Was given ativan last night.  Objective: Vitals:   12/03/18 2000 12/03/18 2330 12/04/18 0412 12/04/18 0808  BP: (!) 142/79 106/74 (!) 153/93 132/74  Pulse: 74 80 61 (!) 41    Resp:    (!) 28  Temp:   98.3 F (36.8 C) 98.2 F (36.8 C)  TempSrc:   Oral Oral  SpO2: 99% 97% 95% 100%  Weight:      Height:        Intake/Output Summary (Last 24 hours) at 12/04/2018 0835 Last data filed at 12/03/2018 1618 Gross per 24 hour  Intake 50 ml  Output -  Net 50 ml   Filed Weights   12/01/18 2158  Weight: 62 kg    Examination:   General exam: Agitated,confused HEENT:PERRL,Oral mucosa moist and has dried blood ,halitosis,echymosis around right eye Ear/Nose normal on gross exam Ecchymosis around the right peri-labial area and chin Respiratory system: Bilateral equal air entry, normal vesicular breath sounds, no wheezes or crackles  Cardiovascular system: S1 & S2 heard, RRR. No JVD, murmurs, rubs, gallops or clicks. Gastrointestinal system: Abdomen is nondistended, soft and nontender. No organomegaly or masses felt. Normal bowel sounds heard. Central nervous system: Alert but not  oriented.  Full neurological assessment cudnt be done due  to patient's mental status  extremities: No edema, no clubbing ,no cyanosis, distal peripheral pulses palpable. Ecchymosis on the right hand Skin: Scattered ecchymosis,no icterus ,no pallor   Data Reviewed: I have personally reviewed following labs and imaging studies  CBC: Recent Labs  Lab 12/01/18 1059  WBC 6.6  NEUTROABS 5.0  HGB 12.8*  HCT 39.0  MCV 106.6*  PLT 130   Basic Metabolic Panel: Recent Labs  Lab 12/01/18 1059 12/02/18 0452  NA 144 143  K 3.7 4.0  CL 108 110  CO2 20* 22  GLUCOSE 89 114*  BUN 17 17  CREATININE 1.02 1.00  CALCIUM  9.4 8.6*   GFR: Estimated Creatinine Clearance: 45.6 mL/min (by C-G formula based on SCr of 1 mg/dL). Liver Function Tests: Recent Labs  Lab 12/01/18 1059 12/02/18 0452  AST 70* 49*  ALT 46* 36  ALKPHOS 41 39  BILITOT 0.9 1.3*  PROT 6.4* 5.7*  ALBUMIN 3.6 3.1*   No results for input(s): LIPASE, AMYLASE in the last 168 hours. No results for input(s): AMMONIA  in the last 168 hours. Coagulation Profile: No results for input(s): INR, PROTIME in the last 168 hours. Cardiac Enzymes: Recent Labs  Lab 12/01/18 1600  CKTOTAL 529*   BNP (last 3 results) No results for input(s): PROBNP in the last 8760 hours. HbA1C: Recent Labs    12/01/18 2011  HGBA1C 5.4   CBG: Recent Labs  Lab 12/04/18 0753  GLUCAP 97   Lipid Profile: No results for input(s): CHOL, HDL, LDLCALC, TRIG, CHOLHDL, LDLDIRECT in the last 72 hours. Thyroid Function Tests: Recent Labs    12/01/18 2011  TSH 0.587   Anemia Panel: Recent Labs    12/01/18 2011  VITAMINB12 363   Sepsis Labs: No results for input(s): PROCALCITON, LATICACIDVEN in the last 168 hours.  Recent Results (from the past 240 hour(s))  Urine culture     Status: None   Collection Time: 12/01/18  7:40 PM  Result Value Ref Range Status   Specimen Description URINE, CLEAN CATCH  Final   Special Requests   Final    NONE Performed at [facility], [address], [place], [place] [address]    Culture   Final    Multiple bacterial morphotypes present, none predominant. Suggest appropriate recollection if clinically indicated.   Report Status 12/03/2018 FINAL  Final         Radiology Studies: No results found.      Scheduled Meds: . amLODipine  5 mg Oral Daily   Or  . benazepril  20 mg Oral Daily  . aspirin EC  81 mg Oral Q M,W,F  . feeding supplement (ENSURE ENLIVE)  237 mL Oral BID  . folic acid  1 mg Oral Daily  . Influenza vac split quadrivalent PF  0.5 mL Intramuscular Tomorrow-1000  . LORazepam  0-4 mg Oral Q12H  . magnesium oxide  400 mg Oral Daily  . multivitamin with minerals  1 tablet Oral Daily  . rosuvastatin  20 mg Oral q1800   Continuous Infusions: . thiamine injection       LOS: 2 days    Time spent:25 mins. More than 50% of that time was spent in counseling and/or coordination of care.      Shelly Coss, MD [facility] Pager  [phone]  If 7PM-7AM, please contact night-coverage [email or web address] Password [facility] 12/04/2018, 8:35 AM

## 2018-12-04 NOTE — Care Management Important Message (Signed)
Important Message  Patient Details  Name: Victor Clarke MRN: [id number] Date of Birth: Jun 16, 1929 Due to illness patient not able to sign.    Medicare Important Message Given:  Yes    [name] 12/04/2018, 1:46 PM

## 2018-12-04 NOTE — Clinical Social Work Note (Signed)
Clinical Social Work Assessment  Patient Details  Name: Victor Clarke MRN: [id number] Date of Birth: 03-02-1929  Date of referral:  12/04/18               Reason for consult:  Facility Placement                Permission sought to share information with:  Facility Sport and exercise psychologist, Family Supports Permission granted to share information::  Yes, Verbal Permission Granted  Name::     Ronnette Hila  Agency::  [facility]  Relationship::  Niece, Brother  Sport and exercise psychologist Information:     Housing/Transportation Living arrangements for the past 2 months:  [place] of Information:  Medical Team, Facility, Other (Comment Required) Patient Interpreter Needed:  None Criminal Activity/Legal Involvement Pertinent to Current Situation/Hospitalization:  No - Comment as needed Significant Relationships:  Siblings, Other Family Members Lives with:  Self Do you feel safe going back to the place where you live?  Yes Need for family participation in patient care:  Yes (Comment)  Care giving concerns:  Patient from home alone. Patient had been at [facility] in 2018, but signed himself out against medical advice. Patient continues to go visit [place] and pick up his mail there twice a week, and has talked about going back there eventually. Patient not safe to return home alone at this time.   Social Worker assessment / plan:  CSW gathered information about the patient by contacting previous facility and family. [facility] admissions discussed how they wanted to keep the patient, but he had signed himself out. Patient continues to go visit and pick up his mail, and had talked about moving back in but was never ready. Patient's niece agreed that patient does not need to be home by himself, and talked to her dad (patient's brother) who is in agreement to get patient back into [facility]. [facility] will admit the patient, but will need to wait until next week for  admission; unable to do over the weekend.  Employment status:  Retired Forensic scientist:  Medicare PT Recommendations:  [place] / Referral to community resources:  [place]  Patient/Family's Response to care:  Patient's response unable to be determined due to confusion. Patient's family agreeable for patient to return to [facility].  Patient/Family's Understanding of and Emotional Response to Diagnosis, Current Treatment, and Prognosis:  Patient's family and facility discussed how they are aware that the patient doesn't need to be home alone, and that he will need to move into a facility. [facility] said that they know him very well and would be glad to have him back when he's ready.   Emotional Assessment Appearance:  Appears stated age Attitude/Demeanor/Rapport:  Unable to Assess Affect (typically observed):  Unable to Assess Orientation:  Oriented to Self Alcohol / Substance use:  Not Applicable Psych involvement (Current and /or in the community):  No (Comment)  Discharge Needs  Concerns to be addressed:  Care Coordination Readmission within the last 30 days:  No Current discharge risk:  Physical Impairment, Cognitively Impaired, Dependent with Mobility, Lives alone, Substance Abuse Barriers to Discharge:  Continued Medical Work up, Ship broker, [place], LCSW 12/04/2018, 4:19 PM

## 2018-12-04 NOTE — Progress Notes (Signed)
Physical Therapy Treatment Patient Details Name: Victor Clarke MRN: [id number] DOB: 11-Mar-1929 Today's Date: 12/04/2018    History of Present Illness Pt is an 85 y/o male admitted secondary to sustaining a fall at home. Pt found to be intoxicated upon arrival. Of note, pt's MRI of his brain revealed a small 14 x 4 mm right superior frontal convexity meningioma that is probably clinically silent. PMH including but not limited to Parkinson's, HTN, COPD and alcohol dependence.     PT Comments    Pt very limited this session secondary to lethargy. He was initially asleep upon arrival and difficult to maintain arousal throughout. Pt required max A to achieve upright sitting at EOB and then min A to return to supine. Pt would continue to benefit from skilled physical therapy services at this time while admitted and after d/c to address the below listed limitations in order to improve overall safety and independence with functional mobility.    Follow Up Recommendations  SNF     Equipment Recommendations  None recommended by PT    Recommendations for Other Services       Precautions / Restrictions Precautions Precautions: Fall Restrictions Weight Bearing Restrictions: No    Mobility  Bed Mobility Overal bed mobility: Needs Assistance Bed Mobility: Supine to Sit;Sit to Supine     Supine to sit: Max assist Sit to supine: Min assist   General bed mobility comments: increased time and effort, max A for trunk elevation, min A to return bilateral LEs onto bed  Transfers                 General transfer comment: pt unable secondary to significant lethargy and prematurely lying back down in bed  Ambulation/Gait                 Stairs             Wheelchair Mobility    Modified Rankin (Stroke Patients Only)       Balance Overall balance assessment: Needs assistance Sitting-balance support: Bilateral upper extremity supported;Feet supported Sitting  balance-Leahy Scale: Poor                                      Cognition Arousal/Alertness: Lethargic Behavior During Therapy: Flat affect Overall Cognitive Status: Difficult to assess Area of Impairment: Following commands;Problem solving                       Following Commands: Follows one step commands inconsistently     Problem Solving: Slow processing;Decreased initiation;Difficulty sequencing;Requires verbal cues;Requires tactile cues        Exercises      General Comments        Pertinent Vitals/Pain Pain Assessment: Faces Faces Pain Scale: No hurt    Home Living                      Prior Function            PT Goals (current goals can now be found in the care plan section) Acute Rehab PT Goals PT Goal Formulation: With patient Time For Goal Achievement: 12/16/18 Potential to Achieve Goals: Fair Progress towards PT goals: Progressing toward goals    Frequency    Min 2X/week      PT Plan Current plan remains appropriate    Co-evaluation  AM-PAC PT "6 Clicks" Mobility   Outcome Measure  Help needed turning from your back to your side while in a flat bed without using bedrails?: A Lot Help needed moving from lying on your back to sitting on the side of a flat bed without using bedrails?: A Lot Help needed moving to and from a bed to a chair (including a wheelchair)?: A Lot Help needed standing up from a chair using your arms (e.g., wheelchair or bedside chair)?: A Lot Help needed to walk in hospital room?: A Lot Help needed climbing 3-5 steps with a railing? : Total 6 Click Score: 11    End of Session   Activity Tolerance: Patient limited by lethargy Patient left: in bed;with call bell/phone within reach;with bed alarm set Nurse Communication: Mobility status PT Visit Diagnosis: Other abnormalities of gait and mobility (R26.89);Repeated falls (R29.6)     Time: 7353-2992 PT Time  Calculation (min) (ACUTE ONLY): 10 min  Charges:  $Therapeutic Activity: 8-22 mins                     Sherie Don, [place], DPT  Acute Rehabilitation Services Pager [phone] Office [place] 12/04/2018, 12:47 PM

## 2018-12-05 LAB — GLUCOSE, CAPILLARY: GLUCOSE-CAPILLARY: 113 mg/dL — AB (ref 70–99)

## 2018-12-05 MED ORDER — BENAZEPRIL HCL 20 MG PO TABS
20.0000 mg | ORAL_TABLET | Freq: Every day | ORAL | Status: DC
  Administered 2018-12-05 – 2018-12-11 (×7): 20 mg via ORAL
  Filled 2018-12-05 (×7): qty 1

## 2018-12-05 MED ORDER — AMLODIPINE BESYLATE 5 MG PO TABS
5.0000 mg | ORAL_TABLET | Freq: Every day | ORAL | Status: DC
  Administered 2018-12-06 – 2018-12-11 (×6): 5 mg via ORAL
  Filled 2018-12-05 (×6): qty 1

## 2018-12-05 NOTE — Plan of Care (Signed)
  Problem: Clinical Measurements: Goal: Ability to maintain clinical measurements within normal limits will improve Outcome: Progressing   Problem: Clinical Measurements: Goal: Cardiovascular complication will be avoided Outcome: Progressing   

## 2018-12-05 NOTE — Progress Notes (Signed)
PROGRESS NOTE    Victor Clarke  DPO:[id number] DOB: 03/28/1929 DOA: 12/01/2018 PCP: Sherald Hess., MD   Brief Narrative: Patient is a 85 year old male with history of hypertension, alcohol dependence, COPD who presented to the emergency department after a fall at home.  Ethanol level was elevated to 196.Found to be ataxic on presentation.  Gait difficulty suspected to be from alcoholism and thiamine deficiency.  Currently managing for ataxia, altered mental status, alcohol withdrawal.  Assessment & Plan:   Principal Problem:   Ataxia Active Problems:   Hypertension   Problematic consumption of alcohol  Ataxia: Suspected from  with alcohol intoxication/thiamine deficiency.  MRI of the brain did not show any acute intracranial findings. Started on high-dose thiamine supplementation.Continue thiamine. TSH and vitamin B-12 level normal.  Fall: Reported to have fallen at home.  Lacerated his chin,lip.  PT evaluated him and recommended skilled nursing facility on discharge.  Social worker consulted and following  Alcohol dependence: Long history of alcohol abuse.  Intoxicated on presentation.  Continue thiamine and folic acid.    Alcohol withdrawal/Delirium: Continued agitated this morning. Was given ativan last night. Continue to monitor CIWA.  Continue Ativan as needed for withdrawal.  Hypertension: Currently  stable.  Continue amlodipine-benazepril.  Patient is medically stable for discharge to skilled nursing facility as soon as the bed is available.         DVT prophylaxis: SCD Code Status:Full  Family Communication: None present at the bedside Disposition Plan: Skilled nursing facility as soon as bed is available   Consultants: None  Procedures: None  Antimicrobials: None  Subjective: Patient seen and examined the bedside this morning.  Status might have improved this morning.  Alert but not oriented.  Communicates.  Thinks that he is still at  home.  Objective: Vitals:   12/04/18 2007 12/04/18 2354 12/05/18 0346 12/05/18 0812  BP: 110/73 (!) 123/96 121/80 (!) 149/64  Pulse: 89 (!) 122 (!) 44 (!) 53  Resp:    17  Temp: 97.7 F (36.5 C) 98.1 F (36.7 C)  98.2 F (36.8 C)  TempSrc: Oral Oral  Axillary  SpO2: 98% 99% 97% 94%  Weight:      Height:       No intake or output data in the 24 hours ending 12/05/18 1053 Filed Weights   12/01/18 2158  Weight: 62 kg    Examination:   General exam: Elderly debilitated male , confused HEENT:PERRL,Oral mucosa moist Dried blood on the right lip, ecchymosis around the right eye and chin  respiratory system: Bilateral equal air entry, normal vesicular breath sounds, no wheezes or crackles  Cardiovascular system: S1 & S2 heard, RRR. No JVD, murmurs, rubs, gallops or clicks. Gastrointestinal system: Abdomen is nondistended, soft and nontender. No organomegaly or masses felt. Normal bowel sounds heard. Central nervous system: Alert but not  Oriented. Extremities: Ecchymosis on the upper extremities ,no edema, no clubbing ,no cyanosis, distal peripheral pulses palpable. Skin: Scattered ecchymosis     Data Reviewed: I have personally reviewed following labs and imaging studies  CBC: Recent Labs  Lab 12/01/18 1059  WBC 6.6  NEUTROABS 5.0  HGB 12.8*  HCT 39.0  MCV 106.6*  PLT 431   Basic Metabolic Panel: Recent Labs  Lab 12/01/18 1059 12/02/18 0452  NA 144 143  K 3.7 4.0  CL 108 110  CO2 20* 22  GLUCOSE 89 114*  BUN 17 17  CREATININE 1.02 1.00  CALCIUM 9.4 8.6*   GFR: Estimated  Creatinine Clearance: 45.6 mL/min (by C-G formula based on SCr of 1 mg/dL). Liver Function Tests: Recent Labs  Lab 12/01/18 1059 12/02/18 0452  AST 70* 49*  ALT 46* 36  ALKPHOS 41 39  BILITOT 0.9 1.3*  PROT 6.4* 5.7*  ALBUMIN 3.6 3.1*   No results for input(s): LIPASE, AMYLASE in the last 168 hours. No results for input(s): AMMONIA in the last 168 hours. Coagulation  Profile: No results for input(s): INR, PROTIME in the last 168 hours. Cardiac Enzymes: Recent Labs  Lab 12/01/18 1600  CKTOTAL 529*   BNP (last 3 results) No results for input(s): PROBNP in the last 8760 hours. HbA1C: No results for input(s): HGBA1C in the last 72 hours. CBG: Recent Labs  Lab 12/04/18 0753 12/05/18 0811  GLUCAP 97 113*   Lipid Profile: No results for input(s): CHOL, HDL, LDLCALC, TRIG, CHOLHDL, LDLDIRECT in the last 72 hours. Thyroid Function Tests: No results for input(s): TSH, T4TOTAL, FREET4, T3FREE, THYROIDAB in the last 72 hours. Anemia Panel: No results for input(s): VITAMINB12, FOLATE, FERRITIN, TIBC, IRON, RETICCTPCT in the last 72 hours. Sepsis Labs: No results for input(s): PROCALCITON, LATICACIDVEN in the last 168 hours.  Recent Results (from the past 240 hour(s))  Urine culture     Status: None   Collection Time: 12/01/18  7:40 PM  Result Value Ref Range Status   Specimen Description URINE, CLEAN CATCH  Final   Special Requests   Final    NONE Performed at [facility], [address], [place], [place] [address]    Culture   Final    Multiple bacterial morphotypes present, none predominant. Suggest appropriate recollection if clinically indicated.   Report Status 12/03/2018 FINAL  Final         Radiology Studies: No results found.      Scheduled Meds: . amLODipine  5 mg Oral Daily   Or  . benazepril  20 mg Oral Daily  . aspirin EC  81 mg Oral Q M,W,F  . feeding supplement (ENSURE ENLIVE)  237 mL Oral BID  . folic acid  1 mg Oral Daily  . Influenza vac split quadrivalent PF  0.5 mL Intramuscular Tomorrow-1000  . LORazepam  0-4 mg Oral Q12H  . magnesium oxide  400 mg Oral Daily  . multivitamin with minerals  1 tablet Oral Daily  . rosuvastatin  20 mg Oral q1800   Continuous Infusions: . thiamine injection 250 mg (12/05/18 1046)     LOS: 3 days    Time spent:25 mins. More than 50% of that time was spent in  counseling and/or coordination of care.      Shelly Coss, MD [facility] Pager [phone]  If 7PM-7AM, please contact night-coverage [email or web address] Password [facility] 12/05/2018, 10:53 AM

## 2018-12-06 LAB — GLUCOSE, CAPILLARY: Glucose-Capillary: 92 mg/dL (ref 70–99)

## 2018-12-06 MED ORDER — PHENOL 1.4 % MT LIQD
1.0000 | OROMUCOSAL | Status: DC | PRN
  Administered 2018-12-06: 1 via OROMUCOSAL
  Filled 2018-12-06: qty 177

## 2018-12-06 NOTE — Progress Notes (Signed)
PROGRESS NOTE    Victor Clarke  WER:[id number] DOB: 11/23/1928 DOA: 12/01/2018 PCP: Sherald Hess., MD   Brief Narrative: Patient is a 85 year old male with history of hypertension, alcohol dependence, COPD who presented to the emergency department after a fall at home.  Ethanol level was elevated to 196.Found to be ataxic on presentation.  Gait difficulty suspected to be from alcoholism and thiamine deficiency.  Currently managing for ataxia, altered mental status, alcohol withdrawal.  His mental status has significantly improved today.  He is waiting for skilled nursing facility placement.  Assessment & Plan:   Principal Problem:   Ataxia Active Problems:   Hypertension   Problematic consumption of alcohol  Ataxia: Suspected from  with alcohol intoxication/thiamine deficiency.  MRI of the brain did not show any acute intracranial findings. Started on high-dose thiamine supplementation.Continue thiamine. TSH and vitamin B-12 level normal.  Fall: Reported to have fallen at home.  Lacerated his chin,lip.  PT evaluated him and recommended skilled nursing facility on discharge.  Social worker consulted and following  Alcohol dependence: Long history of alcohol abuse.  Intoxicated on presentation.  Continue thiamine and folic acid.    Alcohol withdrawal/Delirium: Mental status has significantly improved this morning.  He is currently alert and oriented. Continue to monitor CIWA.  Continue Ativan as needed for withdrawal.  Hypertension: Currently  stable.  Continue amlodipine-benazepril.  Patient is medically stable for discharge to skilled nursing facility as soon as the bed is available.         DVT prophylaxis: SCD Code Status:Full  Family Communication: None present at the bedside Disposition Plan: Skilled nursing facility as soon as bed is available   Consultants: None  Procedures: None  Antimicrobials: None  Subjective: Patient seen and examined the bedside  this morning.  Mental status has significantly improved.  He is calm and cooperative.  Alert and oriented. Objective: Vitals:   12/05/18 1948 12/05/18 2349 12/06/18 0418 12/06/18 0821  BP: 110/75 131/89 115/76 116/81  Pulse: 72 68 82 82  Resp:    20  Temp: 98.1 F (36.7 C)   98.6 F (37 C)  TempSrc: Oral   Oral  SpO2: 99% 99% 100% 100%  Weight:      Height:        Intake/Output Summary (Last 24 hours) at 12/06/2018 1141 Last data filed at 12/06/2018 0205 Gross per 24 hour  Intake -  Output 550 ml  Net -550 ml   Filed Weights   12/01/18 2158  Weight: 62 kg    Examination:    General exam: Elderly debilitated male  HEENT:PERRL,Oral mucosa moist, Ear/Nose normal on gross exam Respiratory system: Bilateral equal air entry, normal vesicular breath sounds, no wheezes or crackles  Cardiovascular system: S1 & S2 heard, RRR. No JVD, murmurs, rubs, gallops or clicks. Gastrointestinal system: Abdomen is nondistended, soft and nontender. No organomegaly or masses felt. Normal bowel sounds heard. Central nervous system: Alert and oriented. No focal neurological deficits. Extremities: No edema, no clubbing ,no cyanosis, distal peripheral pulses palpable. Skin: Scattered ecchymosis, lesions or ulcers,no icterus ,no pallor      Data Reviewed: I have personally reviewed following labs and imaging studies  CBC: Recent Labs  Lab 12/01/18 1059  WBC 6.6  NEUTROABS 5.0  HGB 12.8*  HCT 39.0  MCV 106.6*  PLT 195   Basic Metabolic Panel: Recent Labs  Lab 12/01/18 1059 12/02/18 0452  NA 144 143  K 3.7 4.0  CL 108 110  CO2  20* 22  GLUCOSE 89 114*  BUN 17 17  CREATININE 1.02 1.00  CALCIUM 9.4 8.6*   GFR: Estimated Creatinine Clearance: 45.6 mL/min (by C-G formula based on SCr of 1 mg/dL). Liver Function Tests: Recent Labs  Lab 12/01/18 1059 12/02/18 0452  AST 70* 49*  ALT 46* 36  ALKPHOS 41 39  BILITOT 0.9 1.3*  PROT 6.4* 5.7*  ALBUMIN 3.6 3.1*   No results for  input(s): LIPASE, AMYLASE in the last 168 hours. No results for input(s): AMMONIA in the last 168 hours. Coagulation Profile: No results for input(s): INR, PROTIME in the last 168 hours. Cardiac Enzymes: Recent Labs  Lab 12/01/18 1600  CKTOTAL 529*   BNP (last 3 results) No results for input(s): PROBNP in the last 8760 hours. HbA1C: No results for input(s): HGBA1C in the last 72 hours. CBG: Recent Labs  Lab 12/04/18 0753 12/05/18 0811 12/06/18 0728  GLUCAP 97 113* 92   Lipid Profile: No results for input(s): CHOL, HDL, LDLCALC, TRIG, CHOLHDL, LDLDIRECT in the last 72 hours. Thyroid Function Tests: No results for input(s): TSH, T4TOTAL, FREET4, T3FREE, THYROIDAB in the last 72 hours. Anemia Panel: No results for input(s): VITAMINB12, FOLATE, FERRITIN, TIBC, IRON, RETICCTPCT in the last 72 hours. Sepsis Labs: No results for input(s): PROCALCITON, LATICACIDVEN in the last 168 hours.  Recent Results (from the past 240 hour(s))  Urine culture     Status: None   Collection Time: 12/01/18  7:40 PM  Result Value Ref Range Status   Specimen Description URINE, CLEAN CATCH  Final   Special Requests   Final    NONE Performed at [facility], [address] [place], [place] [address]    Culture   Final    Multiple bacterial morphotypes present, none predominant. Suggest appropriate recollection if clinically indicated.   Report Status 12/03/2018 FINAL  Final         Radiology Studies: No results found.      Scheduled Meds: . amLODipine  5 mg Oral Daily   And  . benazepril  20 mg Oral Daily  . aspirin EC  81 mg Oral Q M,W,F  . feeding supplement (ENSURE ENLIVE)  237 mL Oral BID  . folic acid  1 mg Oral Daily  . Influenza vac split quadrivalent PF  0.5 mL Intramuscular Tomorrow-1000  . magnesium oxide  400 mg Oral Daily  . multivitamin with minerals  1 tablet Oral Daily  . rosuvastatin  20 mg Oral q1800   Continuous Infusions:    LOS: 4 days    Time  spent:25 mins. More than 50% of that time was spent in counseling and/or coordination of care.      Shelly Coss, MD [facility] Pager [phone]  If 7PM-7AM, please contact night-coverage [email or web address] Password [facility] 12/06/2018, 11:41 AM

## 2018-12-07 LAB — CBC WITH DIFFERENTIAL/PLATELET
Abs Immature Granulocytes: 0.06 10*3/uL (ref 0.00–0.07)
Basophils Absolute: 0.1 10*3/uL (ref 0.0–0.1)
Basophils Relative: 1 %
Eosinophils Absolute: 0.1 10*3/uL (ref 0.0–0.5)
Eosinophils Relative: 3 %
HCT: 36.8 % — ABNORMAL LOW (ref 39.0–52.0)
Hemoglobin: 12.6 g/dL — ABNORMAL LOW (ref 13.0–17.0)
Immature Granulocytes: 1 %
Lymphocytes Relative: 16 %
Lymphs Abs: 0.8 10*3/uL (ref 0.7–4.0)
MCH: 35 pg — AB (ref 26.0–34.0)
MCHC: 34.2 g/dL (ref 30.0–36.0)
MCV: 102.2 fL — ABNORMAL HIGH (ref 80.0–100.0)
MONO ABS: 0.7 10*3/uL (ref 0.1–1.0)
MONOS PCT: 14 %
Neutro Abs: 3.2 10*3/uL (ref 1.7–7.7)
Neutrophils Relative %: 65 %
Platelets: 175 10*3/uL (ref 150–400)
RBC: 3.6 MIL/uL — ABNORMAL LOW (ref 4.22–5.81)
RDW: 12.1 % (ref 11.5–15.5)
WBC: 4.9 10*3/uL (ref 4.0–10.5)
nRBC: 0 % (ref 0.0–0.2)

## 2018-12-07 LAB — BASIC METABOLIC PANEL
Anion gap: 13 (ref 5–15)
BUN: 16 mg/dL (ref 8–23)
CO2: 20 mmol/L — ABNORMAL LOW (ref 22–32)
Calcium: 8.6 mg/dL — ABNORMAL LOW (ref 8.9–10.3)
Chloride: 107 mmol/L (ref 98–111)
Creatinine, Ser: 1.06 mg/dL (ref 0.61–1.24)
GFR calc Af Amer: >60 mL/min (ref >60)
GFR calc non Af Amer: >60 mL/min (ref >60)
GLUCOSE: 101 mg/dL — AB (ref 70–99)
POTASSIUM: 3.2 mmol/L — AB (ref 3.5–5.1)
Sodium: 140 mmol/L (ref 135–145)

## 2018-12-07 LAB — GLUCOSE, CAPILLARY: Glucose-Capillary: 94 mg/dL (ref 70–99)

## 2018-12-07 MED ORDER — POTASSIUM CHLORIDE CRYS ER 20 MEQ PO TBCR
40.0000 meq | EXTENDED_RELEASE_TABLET | Freq: Once | ORAL | Status: AC
  Administered 2018-12-07: 40 meq via ORAL
  Filled 2018-12-07: qty 2

## 2018-12-07 NOTE — Progress Notes (Signed)
Physical Therapy Treatment Patient Details Name: Victor Clarke MRN: [id number] DOB: [birth year] Today's Date: 12/07/2018    History of Present Illness Pt is an 85 y/o male admitted secondary to sustaining a fall at home. Pt found to be intoxicated upon arrival. Of note, pt's MRI of his brain revealed a small 14 x 4 mm right superior frontal convexity meningioma that is probably clinically silent. PMH including but not limited to Parkinson's, HTN, COPD and alcohol dependence.     PT Comments    Pt is notably better when more alert.  Pt moves at a minimal assist level for most mobility.  He likely will still not be able to make sound safety decisions and is a fall risk, so would not be independent up walking and be able to safely do his ADL's.  If [facility] can satisfy his needs, it might be a good place for him.  If not, he needs up to 24* at SNF level.    Follow Up Recommendations  Home health PT;Other (comment)(at ALF ([facility]) able to provide care (or SNF))     Equipment Recommendations  None recommended by PT    Recommendations for Other Services       Precautions / Restrictions Precautions Precautions: Fall    Mobility  Bed Mobility Overal bed mobility: Needs Assistance Bed Mobility: Supine to Sit     Supine to sit: Min guard;HOB elevated(and rail) Sit to supine: Min assist      Transfers Overall transfer level: Needs assistance Equipment used: Rolling walker (2 wheeled);Straight cane Transfers: Sit to/from Stand Sit to Stand: Min assist         General transfer comment: cues for hand placement, stability assist to keep pt forward.  Ambulation/Gait Ambulation/Gait assistance: Min assist Gait Distance (Feet): 90 Feet(with cane and 30 feet with RW) Assistive device: Rolling walker (2 wheeled);Straight cane Gait Pattern/deviations: Step-through pattern   Gait velocity interpretation: <1.8 ft/sec, indicate of risk for recurrent falls General Gait  Details: unsteady gait overall with listing off to the right and mildly stagering and scissoring to maintain balance.  Withe the RW, pt still unsteady, but can stay upright with minimal assist better.   Stairs             Wheelchair Mobility    Modified Rankin (Stroke Patients Only)       Balance Overall balance assessment: Needs assistance Sitting-balance support: No upper extremity supported;Single extremity supported Sitting balance-Leahy Scale: Good Sitting balance - Comments: pt could shift weight on the toilet and reach back to wipe.   Standing balance support: Single extremity supported;Bilateral upper extremity supported Standing balance-Leahy Scale: Poor Standing balance comment: needs AD and or external support                            Cognition Arousal/Alertness: Awake/alert;Lethargic Behavior During Therapy: Flat affect Overall Cognitive Status: History of cognitive impairments - at baseline                         Following Commands: Follows one step commands with increased time;Follows one step commands inconsistently Safety/Judgement: Decreased awareness of safety   Problem Solving: Slow processing        Exercises      General Comments        Pertinent Vitals/Pain Pain Assessment: Faces Faces Pain Scale: No hurt    Home Living  Prior Function            PT Goals (current goals can now be found in the care plan section) Acute Rehab PT Goals Patient Stated Goal: none stated PT Goal Formulation: With patient Time For Goal Achievement: 12/16/18 Potential to Achieve Goals: Fair Progress towards PT goals: Progressing toward goals    Frequency    Min 2X/week      PT Plan Current plan remains appropriate;Discharge plan needs to be updated    Co-evaluation              AM-PAC PT "6 Clicks" Mobility   Outcome Measure  Help needed turning from your back to your side while  in a flat bed without using bedrails?: A Little Help needed moving from lying on your back to sitting on the side of a flat bed without using bedrails?: A Little Help needed moving to and from a bed to a chair (including a wheelchair)?: A Little Help needed standing up from a chair using your arms (e.g., wheelchair or bedside chair)?: A Little Help needed to walk in hospital room?: A Little Help needed climbing 3-5 steps with a railing? : A Lot 6 Click Score: 17    End of Session   Activity Tolerance: Patient tolerated treatment well Patient left: in bed;with call bell/phone within reach;with bed alarm set Nurse Communication: Mobility status PT Visit Diagnosis: Other abnormalities of gait and mobility (R26.89);Unsteadiness on feet (R26.81)     Time: 0017-4944 PT Time Calculation (min) (ACUTE ONLY): 34 min  Charges:  $Gait Training: 8-22 mins $Therapeutic Activity: 8-22 mins                     12/07/2018  Donnella Sham, PT Acute Rehabilitation Services [phone]  (pager) [phone]  (office)   Tessie Fass Hence Derrick 12/07/2018, 2:52 PM

## 2018-12-07 NOTE — NC FL2 (Signed)
[place] LEVEL OF CARE SCREENING TOOL     IDENTIFICATION  Patient Name: Victor Clarke Birthdate: 1929/06/03 Sex: male Admission Date (Current Location): 12/01/2018  [facility] and [place] Number:  Herbalist and Address:  [facility], [place] [address] [place], [place] [address]      Provider Number: [id number]  Attending Physician Name and Address:  Shelly Coss, MD  Relative Name and Phone Number:       Current Level of Care: Hospital Recommended Level of Care: [place] Prior Approval Number:    Date Approved/Denied:   PASRR Number:    Discharge Plan: Other (Comment)(ALF)    Current Diagnoses: Patient Active Problem List   Diagnosis Date Noted  . Problematic consumption of alcohol 12/01/2018  . Tooth impaction 12/01/2018  . Ataxia 12/01/2018  . First degree AV block   . Hypotension due to hypovolemia   . Syncope 06/18/2018  . Leukopenia 06/18/2018  . AV block, [place] II 06/18/2018  . Memory loss 10/22/2016  . Malnutrition of moderate degree 11/15/2015  . Fall from steps 11/07/2015  . Anemia 11/07/2015  . Alcohol dependence ([place]) 11/07/2015  . Pyuria 11/07/2015  . Dehydration 08/08/2015  . UTI (urinary tract infection) 08/08/2015  . High anion gap metabolic acidosis 98/92/1194  . Dizziness 07/31/2015  . Orthostatic hypotension 07/31/2015  . Lactic acidosis 07/31/2015  . Ectopic atrial tachycardia ([place])   . Hypercholesteremia   . Dyspnea on exertion 09/16/2014  . Hypertension 09/16/2014    Orientation RESPIRATION BLADDER Height & Weight     Self  Normal Continent, Incontinent(incontinent at times) Weight: 136 lb 11 oz (62 kg) Height:  5\' 9"  (175.3 cm)  BEHAVIORAL SYMPTOMS/MOOD NEUROLOGICAL BOWEL NUTRITION STATUS      Continent, Incontinent(incontinent at times) Diet(see DC summary)  AMBULATORY STATUS COMMUNICATION OF NEEDS Skin   Limited Assist Verbally Skin abrasions                        Personal Care Assistance Level of Assistance  Bathing, Feeding, Dressing Bathing Assistance: Limited assistance Feeding assistance: Independent Dressing Assistance: Limited assistance     Functional Limitations Info  Sight, Hearing, Speech Sight Info: Adequate Hearing Info: Impaired Speech Info: Adequate    SPECIAL CARE FACTORS FREQUENCY  PT (By licensed PT), OT (By licensed OT)     PT Frequency: 3x/wk with home health OT Frequency: 3x/wk with home health            Contractures Contractures Info: Not present    Additional Factors Info  Code Status, Allergies Code Status Info: DNR Allergies Info: NKA           Current Medications (12/07/2018):  This is the current hospital active medication list Current Facility-Administered Medications  Medication Dose Route Frequency Provider Last Rate Last Dose  . acetaminophen (TYLENOL) tablet 650 mg  650 mg Oral Q6H PRN Opyd, Ilene Qua, MD       Or  . acetaminophen (TYLENOL) suppository 650 mg  650 mg Rectal Q6H PRN Opyd, Ilene Qua, MD      . amLODipine (NORVASC) tablet 5 mg  5 mg Oral Daily Adhikari, Amrit, MD   5 mg at 12/07/18 0900   And  . benazepril (LOTENSIN) tablet 20 mg  20 mg Oral Daily Adhikari, Amrit, MD   20 mg at 12/07/18 0900  . aspirin EC tablet 81 mg  81 mg Oral Q M,W,F Opyd, Ilene Qua, MD  81 mg at 12/07/18 0900  . feeding supplement (ENSURE ENLIVE) (ENSURE ENLIVE) liquid 237 mL  237 mL Oral BID Opyd, Ilene Qua, MD   237 mL at 12/07/18 0902  . folic acid (FOLVITE) tablet 1 mg  1 mg Oral Daily Opyd, Ilene Qua, MD   1 mg at 12/07/18 0900  . HYDROcodone-acetaminophen (NORCO/VICODIN) 5-325 MG per tablet 0.5-1 tablet  0.5-1 tablet Oral Q4H PRN Opyd, Ilene Qua, MD   0.5 tablet at 12/05/18 0634  . Influenza vac split quadrivalent PF (FLUZONE HIGH-DOSE) injection 0.5 mL  0.5 mL Intramuscular Tomorrow-1000 Adhikari, Amrit, MD      . magnesium oxide (MAG-OX) tablet 400 mg  400 mg Oral Daily Opyd, Ilene Qua, MD    400 mg at 12/07/18 0900  . multivitamin with minerals tablet 1 tablet  1 tablet Oral Daily Opyd, Ilene Qua, MD   1 tablet at 12/07/18 0900  . ondansetron (ZOFRAN) tablet 4 mg  4 mg Oral Q6H PRN Opyd, Ilene Qua, MD       Or  . ondansetron (ZOFRAN) injection 4 mg  4 mg Intravenous Q6H PRN Opyd, Ilene Qua, MD      . phenol (CHLORASEPTIC) mouth spray 1 spray  1 spray Mouth/Throat PRN Shelly Coss, MD   1 spray at 12/06/18 1842  . polyethylene glycol (MIRALAX / GLYCOLAX) packet 17 g  17 g Oral Daily PRN Opyd, Ilene Qua, MD      . rosuvastatin (CRESTOR) tablet 20 mg  20 mg Oral q1800 Opyd, Ilene Qua, MD   20 mg at 12/06/18 1748     Discharge Medications: Please see discharge summary for a list of discharge medications.  Relevant Imaging Results:  Relevant Lab Results:   Additional Information SS#: [id number]  Geralynn Ochs, LCSW

## 2018-12-07 NOTE — Progress Notes (Signed)
PROGRESS NOTE    Victor Clarke  XAJ:[id number] DOB: 12/26/1928 DOA: 12/01/2018 PCP: Sherald Hess., MD   Brief Narrative: Patient is a 85 year old male with history of hypertension, alcohol dependence, COPD who presented to the emergency department after a fall at home.  Ethanol level was elevated to 196.Found to be ataxic on presentation.  Gait difficulty suspected to be from alcoholism and thiamine deficiency.  Currently managing for ataxia, altered mental status, alcohol withdrawal.  His mental status has significantly improved today.  He is waiting for skilled nursing facility placement.  Assessment & Plan:   Principal Problem:   Ataxia Active Problems:   Hypertension   Problematic consumption of alcohol  Ataxia: Suspected from  with alcohol intoxication/thiamine deficiency.  MRI of the brain did not show any acute intracranial findings. Started on high-dose thiamine supplementation.Continue thiamine oral on discharge. TSH and vitamin B-12 level normal.  Fall: Reported to have fallen at home.  Lacerated his chin,lip.  PT evaluated him and recommended skilled nursing facility on discharge.  Social worker consulted and following  Alcohol dependence: Long history of alcohol abuse.  Intoxicated on presentation.  Continue thiamine and folic acid.    Alcohol withdrawal/Delirium: Mental status has significantly improved this morning.  He is currently alert and oriented. Continue to monitor CIWA.  Continue Ativan as needed for withdrawal.  Hypertension: Currently  stable.  Continue amlodipine-benazepril.  Patient is medically stable for discharge to skilled nursing facility as soon as the bed is available.         DVT prophylaxis: SCD Code Status:Full  Family Communication: None present at the bedside Disposition Plan: Skilled nursing facility as soon as bed is available   Consultants: None  Procedures: None  Antimicrobials: None  Subjective: Patient seen and  examined the bedside this morning.  Mental status has significantly improved.  He is calm and cooperative.  Alert and oriented.  He says that he has long history of alcoholism in his family and lost both his parents due to that.   Objective: Vitals:   12/07/18 0000 12/07/18 0400 12/07/18 0805 12/07/18 1143  BP: 132/88 128/78 122/79 (!) 114/53  Pulse: 81 80 79 65  Resp: 20 18 16 18   Temp: 97.9 F (36.6 C) 97.8 F (36.6 C) 98.6 F (37 C) 98.4 F (36.9 C)  TempSrc: Oral Oral Oral Oral  SpO2: 96% 95% 99% 96%  Weight:      Height:       No intake or output data in the 24 hours ending 12/07/18 1420 Filed Weights   12/01/18 2158  Weight: 62 kg    Examination:   General exam: Elderly debilitated male  HEENT:PERRL,Oral mucosa moist, Ear/Nose normal on gross exam, dried blood on the right upper lip Respiratory system: Bilateral equal air entry, normal vesicular breath sounds, no wheezes or crackles  Cardiovascular system: S1 & S2 heard, RRR. No JVD, murmurs, rubs, gallops or clicks. Gastrointestinal system: Abdomen is nondistended, soft and nontender. No organomegaly or masses felt. Normal bowel sounds heard. Central nervous system: Alert and oriented. No focal neurological deficits. Extremities: No edema, no clubbing ,no cyanosis, distal peripheral pulses palpable. Skin: Scattered ecchymosis     Data Reviewed: I have personally reviewed following labs and imaging studies  CBC: Recent Labs  Lab 12/01/18 1059 12/07/18 0627  WBC 6.6 4.9  NEUTROABS 5.0 3.2  HGB 12.8* 12.6*  HCT 39.0 36.8*  MCV 106.6* 102.2*  PLT 156 094   Basic Metabolic Panel: Recent Labs  Lab 12/01/18 1059 12/02/18 0452 12/07/18 0627  NA 144 143 140  K 3.7 4.0 3.2*  CL 108 110 107  CO2 20* 22 20*  GLUCOSE 89 114* 101*  BUN 17 17 16   CREATININE 1.02 1.00 1.06  CALCIUM 9.4 8.6* 8.6*   GFR: Estimated Creatinine Clearance: 43.1 mL/min (by C-G formula based on SCr of 1.06 mg/dL). Liver Function  Tests: Recent Labs  Lab 12/01/18 1059 12/02/18 0452  AST 70* 49*  ALT 46* 36  ALKPHOS 41 39  BILITOT 0.9 1.3*  PROT 6.4* 5.7*  ALBUMIN 3.6 3.1*   No results for input(s): LIPASE, AMYLASE in the last 168 hours. No results for input(s): AMMONIA in the last 168 hours. Coagulation Profile: No results for input(s): INR, PROTIME in the last 168 hours. Cardiac Enzymes: Recent Labs  Lab 12/01/18 1600  CKTOTAL 529*   BNP (last 3 results) No results for input(s): PROBNP in the last 8760 hours. HbA1C: No results for input(s): HGBA1C in the last 72 hours. CBG: Recent Labs  Lab 12/04/18 0753 12/05/18 0811 12/06/18 0728 12/07/18 0640  GLUCAP 97 113* 92 94   Lipid Profile: No results for input(s): CHOL, HDL, LDLCALC, TRIG, CHOLHDL, LDLDIRECT in the last 72 hours. Thyroid Function Tests: No results for input(s): TSH, T4TOTAL, FREET4, T3FREE, THYROIDAB in the last 72 hours. Anemia Panel: No results for input(s): VITAMINB12, FOLATE, FERRITIN, TIBC, IRON, RETICCTPCT in the last 72 hours. Sepsis Labs: No results for input(s): PROCALCITON, LATICACIDVEN in the last 168 hours.  Recent Results (from the past 240 hour(s))  Urine culture     Status: None   Collection Time: 12/01/18  7:40 PM  Result Value Ref Range Status   Specimen Description URINE, CLEAN CATCH  Final   Special Requests   Final    NONE Performed at [facility], [address] [place], [place] [address]    Culture   Final    Multiple bacterial morphotypes present, none predominant. Suggest appropriate recollection if clinically indicated.   Report Status 12/03/2018 FINAL  Final         Radiology Studies: No results found.      Scheduled Meds: . amLODipine  5 mg Oral Daily   And  . benazepril  20 mg Oral Daily  . aspirin EC  81 mg Oral Q M,W,F  . feeding supplement (ENSURE ENLIVE)  237 mL Oral BID  . folic acid  1 mg Oral Daily  . Influenza vac split quadrivalent PF  0.5 mL Intramuscular  Tomorrow-1000  . magnesium oxide  400 mg Oral Daily  . multivitamin with minerals  1 tablet Oral Daily  . rosuvastatin  20 mg Oral q1800   Continuous Infusions:    LOS: 5 days    Time spent:25 mins. More than 50% of that time was spent in counseling and/or coordination of care.      Shelly Coss, MD [facility] Pager [phone]  If 7PM-7AM, please contact night-coverage [email or web address] Password [email or web address] 12/07/2018, 2:20 PM

## 2018-12-07 NOTE — Progress Notes (Signed)
CSW following for discharge plan. CSW faxed information to [facility]; they are concerned because PT was recommending SNF, asking if patient would be more appropriate for rehab. CSW discussed how therapy had worked with the patient when he was very lethargic, hoping that PT can reevaluate patient now that he's more alert and see what would be appropriate for him. If patient is appropriate for ALF with home health services, they would be happy to work on admission to [facility], or if patient does need SNF for rehab then they can work on taking him for admission after SNF.  CSW contacted PT office; requested that someone be assigned to work with patient, if possible, as patient appears more awake this morning.  CSW to continue to follow.  Laveda Abbe, [place] Clinical Social Worker [phone]

## 2018-12-08 LAB — BASIC METABOLIC PANEL
Anion gap: 9 (ref 5–15)
BUN: 20 mg/dL (ref 8–23)
CO2: 23 mmol/L (ref 22–32)
Calcium: 9.5 mg/dL (ref 8.9–10.3)
Chloride: 110 mmol/L (ref 98–111)
Creatinine, Ser: 1 mg/dL (ref 0.61–1.24)
GFR calc Af Amer: >60 mL/min (ref >60)
GFR calc non Af Amer: >60 mL/min (ref >60)
Glucose, Bld: 123 mg/dL — ABNORMAL HIGH (ref 70–99)
Potassium: 3.8 mmol/L (ref 3.5–5.1)
Sodium: 142 mmol/L (ref 135–145)

## 2018-12-08 LAB — GLUCOSE, CAPILLARY
Glucose-Capillary: 109 mg/dL — ABNORMAL HIGH (ref 70–99)
Glucose-Capillary: 94 mg/dL (ref 70–99)

## 2018-12-08 MED ORDER — NYSTATIN 100000 UNIT/GM EX CREA
TOPICAL_CREAM | Freq: Two times a day (BID) | CUTANEOUS | Status: DC
  Administered 2018-12-08 – 2018-12-10 (×6): via TOPICAL
  Filled 2018-12-08: qty 15

## 2018-12-08 MED ORDER — FLUCONAZOLE 150 MG PO TABS
150.0000 mg | ORAL_TABLET | Freq: Once | ORAL | Status: DC
  Filled 2018-12-08: qty 1

## 2018-12-08 NOTE — Progress Notes (Signed)
PROGRESS NOTE    Victor Clarke  LGX:[id number] DOB: 1929-01-26 DOA: 12/01/2018 PCP: Sherald Hess., MD   Brief Narrative: Patient is a 85 year old male with history of hypertension, alcohol dependence, COPD who presented to the emergency department after a fall at home.  Ethanol level was elevated to 196.Found to be ataxic on presentation.  Gait difficulty suspected to be from alcoholism and thiamine deficiency.  Currently managing for ataxia, altered mental status, alcohol withdrawal.  His mental status has significantly improved .  He is waiting for skilled nursing facility placement.  Assessment & Plan:   Principal Problem:   Ataxia Active Problems:   Hypertension   Problematic consumption of alcohol  Ataxia: Suspected from  with alcohol intoxication/thiamine deficiency.  MRI of the brain did not show any acute intracranial findings. Started on high-dose thiamine supplementation.Continue thiamine oral on discharge. TSH and vitamin B-12 level normal.  Fall: Reported to have fallen at home.  Lacerated his chin,lip.  PT evaluated him and recommended skilled nursing facility on discharge.  Social worker consulted and following  Alcohol dependence: Long history of alcohol abuse.  Intoxicated on presentation.  Continue thiamine and folic acid.    Alcohol withdrawal/Delirium: Mental status has significantly improved this morning.  He is currently alert and oriented. Continue to monitor CIWA.  Continue Ativan as needed for withdrawal.  Hypertension: Currently  stable.  Continue amlodipine-benazepril.  Patient is medically stable for discharge to skilled nursing facility as soon as the bed is available.         DVT prophylaxis: SCD Code Status:Full  Family Communication: None present at the bedside Disposition Plan: Skilled nursing facility as soon as bed is available   Consultants: None  Procedures: None  Antimicrobials: None  Subjective: Patient seen and examined  the bedside this morning.  Remains comfortable.    Complaining of rash on his bilateral groin.  No other active issues.   Objective: Vitals:   12/07/18 2336 12/08/18 0448 12/08/18 0754 12/08/18 1143  BP: 128/76 (!) 105/54 (!) 126/92 123/86  Pulse: 72 76 (!) 108 67  Resp: 18 17 14 18   Temp: 97.7 F (36.5 C) 98 F (36.7 C) 97.7 F (36.5 C) 97.6 F (36.4 C)  TempSrc: Oral Oral Oral Oral  SpO2: 99% 98% 100% 99%  Weight:      Height:        Intake/Output Summary (Last 24 hours) at 12/08/2018 1333 Last data filed at 12/08/2018 8144 Gross per 24 hour  Intake 350 ml  Output -  Net 350 ml   Filed Weights   12/01/18 2158  Weight: 62 kg    Examination:     General exam: Elderly demented male  HEENT:PERRL,Oral mucosa moist, Ear/Nose normal on gross exam Respiratory system: Bilateral equal air entry, normal vesicular breath sounds, no wheezes or crackles  Cardiovascular system: S1 & S2 heard, RRR. No JVD, murmurs, rubs, gallops or clicks. Gastrointestinal system: Abdomen is nondistended, soft and nontender. No organomegaly or masses felt. Normal bowel sounds heard. Central nervous system: Alert and oriented. No focal neurological deficits. Extremities: No edema, no clubbing ,no cyanosis, distal peripheral pulses palpable. Skin: Redness on bilateral groin consistent with fungal infection      Data Reviewed: I have personally reviewed following labs and imaging studies  CBC: Recent Labs  Lab 12/07/18 0627  WBC 4.9  NEUTROABS 3.2  HGB 12.6*  HCT 36.8*  MCV 102.2*  PLT 818   Basic Metabolic Panel: Recent Labs  Lab 12/02/18  8502 12/07/18 0627 12/08/18 0631  NA 143 140 142  K 4.0 3.2* 3.8  CL 110 107 110  CO2 22 20* 23  GLUCOSE 114* 101* 123*  BUN 17 16 20   CREATININE 1.00 1.06 1.00  CALCIUM 8.6* 8.6* 9.5   GFR: Estimated Creatinine Clearance: 45.6 mL/min (by C-G formula based on SCr of 1 mg/dL). Liver Function Tests: Recent Labs  Lab 12/02/18 0452  AST  49*  ALT 36  ALKPHOS 39  BILITOT 1.3*  PROT 5.7*  ALBUMIN 3.1*   No results for input(s): LIPASE, AMYLASE in the last 168 hours. No results for input(s): AMMONIA in the last 168 hours. Coagulation Profile: No results for input(s): INR, PROTIME in the last 168 hours. Cardiac Enzymes: Recent Labs  Lab 12/01/18 1600  CKTOTAL 529*   BNP (last 3 results) No results for input(s): PROBNP in the last 8760 hours. HbA1C: No results for input(s): HGBA1C in the last 72 hours. CBG: Recent Labs  Lab 12/05/18 0811 12/06/18 0728 12/07/18 0640 12/08/18 0640 12/08/18 0734  GLUCAP 113* 92 94 109* 94   Lipid Profile: No results for input(s): CHOL, HDL, LDLCALC, TRIG, CHOLHDL, LDLDIRECT in the last 72 hours. Thyroid Function Tests: No results for input(s): TSH, T4TOTAL, FREET4, T3FREE, THYROIDAB in the last 72 hours. Anemia Panel: No results for input(s): VITAMINB12, FOLATE, FERRITIN, TIBC, IRON, RETICCTPCT in the last 72 hours. Sepsis Labs: No results for input(s): PROCALCITON, LATICACIDVEN in the last 168 hours.  Recent Results (from the past 240 hour(s))  Urine culture     Status: None   Collection Time: 12/01/18  7:40 PM  Result Value Ref Range Status   Specimen Description URINE, CLEAN CATCH  Final   Special Requests   Final    NONE Performed at [facility], [address], [place], [place] [address]    Culture   Final    Multiple bacterial morphotypes present, none predominant. Suggest appropriate recollection if clinically indicated.   Report Status 12/03/2018 FINAL  Final         Radiology Studies: No results found.      Scheduled Meds: . amLODipine  5 mg Oral Daily   And  . benazepril  20 mg Oral Daily  . aspirin EC  81 mg Oral Q M,W,F  . feeding supplement (ENSURE ENLIVE)  237 mL Oral BID  . folic acid  1 mg Oral Daily  . Influenza vac split quadrivalent PF  0.5 mL Intramuscular Tomorrow-1000  . magnesium oxide  400 mg Oral Daily  . multivitamin  with minerals  1 tablet Oral Daily  . rosuvastatin  20 mg Oral q1800   Continuous Infusions:    LOS: 6 days    Time spent:25 mins. More than 50% of that time was spent in counseling and/or coordination of care.      Shelly Coss, MD [facility] Pager [phone]  If 7PM-7AM, please contact night-coverage [email or web address] Password [email or web address] 12/08/2018, 1:33 PM

## 2018-12-09 LAB — GLUCOSE, CAPILLARY
Glucose-Capillary: 97 mg/dL (ref 70–99)
Glucose-Capillary: 116 mg/dL — ABNORMAL HIGH (ref 70–99)

## 2018-12-09 MED ORDER — FOLIC ACID 1 MG PO TABS: 1.0000 mg | ORAL_TABLET | Freq: Every day | ORAL | 0 refills | Status: DC

## 2018-12-09 MED ORDER — VITAMIN B-1 100 MG PO TABS: 100.0000 mg | ORAL_TABLET | Freq: Every day | ORAL | 0 refills | Status: DC

## 2018-12-09 MED ORDER — TUBERCULIN PPD 5 UNIT/0.1ML ID SOLN
5.0000 [IU] | Freq: Once | INTRADERMAL | Status: AC
  Administered 2018-12-09: 5 [IU] via INTRADERMAL
  Filled 2018-12-09: qty 0.1

## 2018-12-09 MED ORDER — NYSTATIN 100000 UNIT/GM EX CREA: TOPICAL_CREAM | Freq: Two times a day (BID) | CUTANEOUS | 0 refills | Status: DC

## 2018-12-09 NOTE — Progress Notes (Signed)
CSW continuing to follow. CSW never received POA paperwork from patient's niece. CSW contacted patient's niece, and she faxed paperwork yesterday evening. CSW requested that patient's niece scan and email the paperwork to ensure that it gets received. Patient's niece appreciative of CSW assistance.  CSW reached out to Admissions at [facility] for an update. Patient's niece and brother completed part of the admissions paperwork, and now they are awaiting input from patient's financial advisor for the financial portion of the paperwork. Patient will also need a PPD completed and read prior to admission; CSW alerted MD. If PPD is placed today, patient can admit to memory care on Friday. Admissions is continuing to work on the paperwork for patient's admission.  CSW to follow.  Laveda Abbe, [place] Clinical Social Worker [phone]

## 2018-12-09 NOTE — Plan of Care (Signed)
  Problem: Clinical Measurements: Goal: Ability to maintain clinical measurements within normal limits will improve Outcome: Progressing   Problem: Education: Goal: Knowledge of General Education information will improve Description: Including pain rating scale, medication(s)/side effects and non-pharmacologic comfort measures Outcome: Progressing   

## 2018-12-09 NOTE — Plan of Care (Signed)
  Problem: Health Behavior/Discharge Planning: Goal: Ability to manage health-related needs will improve Outcome: Progressing   Problem: Education: Goal: Knowledge of General Education information will improve Description: Including pain rating scale, medication(s)/side effects and non-pharmacologic comfort measures Outcome: Progressing   Problem: Clinical Measurements: Goal: Ability to maintain clinical measurements within normal limits will improve Outcome: Progressing   

## 2018-12-09 NOTE — Plan of Care (Signed)
Patient stable, discussed POC with patient, agreeable with plan, denies question/concerns at this time.  

## 2018-12-09 NOTE — Discharge Summary (Addendum)
Physician Discharge Summary  SHOWN DISSINGER HRC:[id number] DOB: [birth year] DOA: 12/01/2018  PCP: Sherald Hess., MD  Admit date: 12/01/2018 Discharge date: 12/11/18 Admitted From: Home Disposition:  SNF Discharge Condition:Stable CODE STATUS: DNR Diet recommendation: Heart Healthy  Brief/Interim Summary:  Patient is a 85 year old male with history of hypertension, alcohol dependence, COPD who presented to the emergency department after a fall at home.  Ethanol level was elevated to 196.Found to be ataxic on presentation.  Gait difficulty suspected to be from alcoholism and thiamine deficiency.  Currently managing for ataxia, altered mental status, alcohol withdrawal.  His mental status has significantly improved .  He is medically stable  for discharge to skilled nursing facility placement.  Following problems were addressed during his  hospitalization:   Ataxia: Suspected from  with alcohol intoxication/thiamine deficiency.  MRI of the brain did not show any acute intracranial findings. Started on high-dose thiamine supplementation.Continue thiamine oral on discharge. TSH and vitamin B-12 level normal.  Fall: Reported to have fallen at home.  Lacerated his chin,lip.  PT evaluated him and recommended skilled nursing facility on discharge. Social worker consulted and following  Alcohol dependence: Long history of alcohol abuse.  Intoxicated on presentation.  Continue thiamine and folic acid.    Alcohol withdrawal/Delirium: Mental status has significantly improved this morning.  He is currently alert and oriented.   Hypertension: Currently  stable.  Continue amlodipine-benazepril.     Discharge Diagnoses:  Principal Problem:   Ataxia Active Problems:   Hypertension   Problematic consumption of alcohol    Discharge Instructions  Discharge Instructions    Diet - low sodium heart healthy   Complete by:  As directed    Discharge instructions   Complete by:  As  directed    1)Take prescribed medication as instructed. 2)Please stop alcohol .   Increase activity slowly   Complete by:  As directed      Allergies as of 12/09/2018   No Known Allergies     Medication List    TAKE these medications   amLODipine-benazepril 5-20 MG capsule Commonly known as:  LOTREL Take 1 capsule by mouth daily.   aspirin EC 81 MG tablet Take 81 mg by mouth every Monday, Wednesday, and Friday.   feeding supplement (ENSURE ENLIVE) Liqd Take 237 mLs by mouth 3 (three) times daily between meals. What changed:  when to take this   folic acid 1 MG tablet Commonly known as:  FOLVITE Take 1 tablet (1 mg total) by mouth daily. What changed:    medication strength  how much to take   LEVICYN Gel Apply 1 application topically daily.   Magnesium Oxide 250 MG Tabs Take 500 mg by mouth daily.   multivitamin tablet Take 1 tablet by mouth daily.   nystatin cream Commonly known as:  MYCOSTATIN Apply topically 2 (two) times daily. Apply on bilateral groins   rosuvastatin 20 MG tablet Commonly known as:  CRESTOR Take 20 mg by mouth daily.   thiamine 100 MG tablet Commonly known as:  VITAMIN B-1 Take 1 tablet (100 mg total) by mouth daily.   Vitamin D3 125 MCG (5000 UT) Caps Take 10,000 Units by mouth daily.      Follow-up Information    Sherald Hess., MD. Schedule an appointment as soon as possible for a visit in 1 week(s).   Specialty:  Family Medicine Contact information: [place] [address] [place],  [place], [place] .   Specialty:  Cardiology Contact information: [address] [place] [address] [phone]          No Known Allergies  Consultations:  None   Procedures/Studies: Dg Chest 2 View  Result Date: 12/01/2018 CLINICAL DATA:  85 year old who fell this morning and injured his face. Current history of atrial fibrillation. EXAM: CHEST - 2 VIEW COMPARISON:   11/19/2018 and earlier, including CTA chest 04/30/2018. FINDINGS: AP ERECT and LATERAL images were obtained. Suboptimal inspiration which accounts for atelectasis in the lower lobes and RIGHT MIDDLE LOBE. Cardiac silhouette moderately enlarged, unchanged. Thoracic aorta atherosclerotic, unchanged. Hilar and mediastinal contours otherwise unremarkable. Chronic interstitial lung disease with associated low lung volumes, unchanged. Lungs otherwise clear. No confluent airspace consolidation. No pleural effusions. Visualized bony thorax intact. IMPRESSION: 1. Suboptimal inspiration accounts for atelectasis in the lower lobes and RIGHT middle lobe. No acute cardiopulmonary disease otherwise. 2. Stable cardiomegaly without pulmonary edema. 3. Chronic interstitial lung disease, likely pulmonary fibrosis. Electronically Signed   By: Evangeline Dakin M.D.   On: 12/01/2018 11:15   Dg Chest 2 View  Result Date: 11/19/2018 CLINICAL DATA:  Short of breath EXAM: CHEST - 2 VIEW COMPARISON:  09/26/2018 FINDINGS: No acute opacity or pleural effusion. Borderline to mild cardiomegaly. Aortic atherosclerosis. No pneumothorax. Mild reticular changes at the bases suggesting fibrosis. IMPRESSION: No active cardiopulmonary disease. Electronically Signed   By: Donavan Foil M.D.   On: 11/19/2018 21:42   Dg Lumbar Spine Complete  Result Date: 11/19/2018 CLINICAL DATA:  Fall with low back pain EXAM: LUMBAR SPINE - COMPLETE 4+ VIEW COMPARISON:  None. FINDINGS: Lumbar alignment is within normal limits. Moderate degenerative change at L3-L4 with mild degenerative change at L1-L2 and L2-L3. Dense aortic atherosclerosis. Surgical changes in the pelvis. Mild scoliosis IMPRESSION: Mild scoliosis and moderate degenerative changes. No acute osseous abnormality. Electronically Signed   By: Donavan Foil M.D.   On: 11/19/2018 21:43   Ct Head Wo Contrast  Result Date: 12/01/2018 CLINICAL DATA:  Fall.  Left jaw pain. EXAM: CT HEAD WITHOUT  CONTRAST CT MAXILLOFACIAL WITHOUT CONTRAST CT CERVICAL SPINE WITHOUT CONTRAST TECHNIQUE: Multidetector CT imaging of the head, cervical spine, and maxillofacial structures were performed using the standard protocol without intravenous contrast. Multiplanar CT image reconstructions of the cervical spine and maxillofacial structures were also generated. COMPARISON:  CT head and cervical spine dated November 19, 2018. FINDINGS: CT HEAD FINDINGS Brain: No evidence of acute infarction, hemorrhage, hydrocephalus, extra-axial collection or mass lesion/mass effect. Stable moderate atrophy and mild chronic microvascular ischemic changes. Vascular: Atherosclerotic vascular calcification of the carotid siphons. No hyperdense vessel. Skull: Normal. Negative for fracture or focal lesion. Other: None. CT MAXILLOFACIAL FINDINGS Osseous: The right maxillary cuspid tooth is now protruding through the maxillary roof, new when compared to prior study. No fracture or mandibular dislocation. No destructive process. Orbits: Negative. No traumatic or inflammatory finding. Sinuses: Minimal ethmoid air cell and maxillary sinus mucosal thickening. Remaining paranasal sinuses and mastoid air cells are clear. Soft tissues: Small amount of subcutaneous emphysema and fluid adjacent to the impacted right maxillary cuspid tooth root. CT CERVICAL SPINE FINDINGS Alignment: No traumatic malalignment. Unchanged trace anterolisthesis at C2-C3, C7-T1, T1-T2, and T2-T3. Skull base and vertebrae: No acute fracture. No primary bone lesion or focal pathologic process. Soft tissues and spinal canal: No prevertebral fluid or swelling. No visible canal hematoma. Disc levels: Unchanged moderate to severe multilevel cervical spondylosis from C3-C4 through C6-C7. Upper chest: Paraseptal and  centrilobular emphysema. Other: Bilateral carotid artery calcific atherosclerosis. IMPRESSION: CT head: 1.  No acute intracranial abnormality. 2. Stable moderate atrophy and  mild chronic microvascular ischemic changes. CT maxillofacial: 1. New impaction of the right maxillary cuspid tooth with protrusion of the root through the maxillary roof. 2.  No acute maxillofacial fracture. CT cervical spine: 1.  No acute cervical spine fracture. 2. Unchanged moderate to severe multilevel cervical spondylosis. 3.  Emphysema (ICD10-J43.9). Electronically Signed   By: Titus Dubin M.D.   On: 12/01/2018 11:51   Ct Head Wo Contrast  Result Date: 11/19/2018 CLINICAL DATA:  Status post multiple falls. Dizziness. Concern for head or cervical spine injury. Initial encounter. EXAM: CT HEAD WITHOUT CONTRAST CT CERVICAL SPINE WITHOUT CONTRAST TECHNIQUE: Multidetector CT imaging of the head and cervical spine was performed following the standard protocol without intravenous contrast. Multiplanar CT image reconstructions of the cervical spine were also generated. COMPARISON:  CT of the head performed 09/18/2018, and cervical spine radiographs performed 04/09/2013 FINDINGS: CT HEAD FINDINGS Brain: No evidence of acute infarction, hemorrhage, hydrocephalus, extra-axial collection or mass lesion / mass effect. Prominence of the ventricles and sulci reflects moderate cortical volume loss. Mild cerebellar atrophy is noted. Scattered periventricular and subcortical white matter change likely reflects small vessel ischemic microangiopathy. The brainstem and fourth ventricle are within normal limits. The basal ganglia are unremarkable in appearance. The cerebral hemispheres demonstrate grossly normal gray-white differentiation. No mass effect or midline shift is seen. Vascular: No hyperdense vessel or unexpected calcification. Skull: There is no evidence of fracture; visualized osseous structures are unremarkable in appearance. Sinuses/Orbits: The orbits are within normal limits. The paranasal sinuses and mastoid air cells are well-aerated. Other: No significant soft tissue abnormalities are seen. CT CERVICAL  SPINE FINDINGS Alignment: Normal. Skull base and vertebrae: No acute fracture. No primary bone lesion or focal pathologic process. Soft tissues and spinal canal: No prevertebral fluid or swelling. No visible canal hematoma. Disc levels: Multilevel disc space narrowing is noted throughout the cervical spine, with scattered anterior and posterior disc osteophyte complexes, and underlying facet disease. Upper chest: Emphysema is noted at the lung apices. The thyroid gland is unremarkable in appearance. Scattered calcification is seen at the carotid bifurcations bilaterally. Other: No additional soft tissue abnormalities are seen. IMPRESSION: 1. No evidence of traumatic intracranial injury or fracture. 2. No evidence of fracture or subluxation along the cervical spine. 3. Moderate cortical volume loss and scattered small vessel ischemic microangiopathy. 4. Mild degenerative change throughout the cervical spine. 5. Emphysema at the lung apices. 6. Scattered calcification at the carotid bifurcations bilaterally. Carotid ultrasound would be helpful for further evaluation, when and as deemed clinically appropriate. Emphysema (ICD10-J43.9). Electronically Signed   By: Garald Balding M.D.   On: 11/19/2018 21:18   Ct Cervical Spine Wo Contrast  Result Date: 12/01/2018 CLINICAL DATA:  Fall.  Left jaw pain. EXAM: CT HEAD WITHOUT CONTRAST CT MAXILLOFACIAL WITHOUT CONTRAST CT CERVICAL SPINE WITHOUT CONTRAST TECHNIQUE: Multidetector CT imaging of the head, cervical spine, and maxillofacial structures were performed using the standard protocol without intravenous contrast. Multiplanar CT image reconstructions of the cervical spine and maxillofacial structures were also generated. COMPARISON:  CT head and cervical spine dated November 19, 2018. FINDINGS: CT HEAD FINDINGS Brain: No evidence of acute infarction, hemorrhage, hydrocephalus, extra-axial collection or mass lesion/mass effect. Stable moderate atrophy and mild chronic  microvascular ischemic changes. Vascular: Atherosclerotic vascular calcification of the carotid siphons. No hyperdense vessel. Skull: Normal. Negative for fracture or focal lesion.  Other: None. CT MAXILLOFACIAL FINDINGS Osseous: The right maxillary cuspid tooth is now protruding through the maxillary roof, new when compared to prior study. No fracture or mandibular dislocation. No destructive process. Orbits: Negative. No traumatic or inflammatory finding. Sinuses: Minimal ethmoid air cell and maxillary sinus mucosal thickening. Remaining paranasal sinuses and mastoid air cells are clear. Soft tissues: Small amount of subcutaneous emphysema and fluid adjacent to the impacted right maxillary cuspid tooth root. CT CERVICAL SPINE FINDINGS Alignment: No traumatic malalignment. Unchanged trace anterolisthesis at C2-C3, C7-T1, T1-T2, and T2-T3. Skull base and vertebrae: No acute fracture. No primary bone lesion or focal pathologic process. Soft tissues and spinal canal: No prevertebral fluid or swelling. No visible canal hematoma. Disc levels: Unchanged moderate to severe multilevel cervical spondylosis from C3-C4 through C6-C7. Upper chest: Paraseptal and centrilobular emphysema. Other: Bilateral carotid artery calcific atherosclerosis. IMPRESSION: CT head: 1.  No acute intracranial abnormality. 2. Stable moderate atrophy and mild chronic microvascular ischemic changes. CT maxillofacial: 1. New impaction of the right maxillary cuspid tooth with protrusion of the root through the maxillary roof. 2.  No acute maxillofacial fracture. CT cervical spine: 1.  No acute cervical spine fracture. 2. Unchanged moderate to severe multilevel cervical spondylosis. 3.  Emphysema (ICD10-J43.9). Electronically Signed   By: Titus Dubin M.D.   On: 12/01/2018 11:51   Ct Cervical Spine Wo Contrast  Result Date: 11/19/2018 CLINICAL DATA:  Status post multiple falls. Dizziness. Concern for head or cervical spine injury. Initial  encounter. EXAM: CT HEAD WITHOUT CONTRAST CT CERVICAL SPINE WITHOUT CONTRAST TECHNIQUE: Multidetector CT imaging of the head and cervical spine was performed following the standard protocol without intravenous contrast. Multiplanar CT image reconstructions of the cervical spine were also generated. COMPARISON:  CT of the head performed 09/18/2018, and cervical spine radiographs performed 04/09/2013 FINDINGS: CT HEAD FINDINGS Brain: No evidence of acute infarction, hemorrhage, hydrocephalus, extra-axial collection or mass lesion / mass effect. Prominence of the ventricles and sulci reflects moderate cortical volume loss. Mild cerebellar atrophy is noted. Scattered periventricular and subcortical white matter change likely reflects small vessel ischemic microangiopathy. The brainstem and fourth ventricle are within normal limits. The basal ganglia are unremarkable in appearance. The cerebral hemispheres demonstrate grossly normal gray-white differentiation. No mass effect or midline shift is seen. Vascular: No hyperdense vessel or unexpected calcification. Skull: There is no evidence of fracture; visualized osseous structures are unremarkable in appearance. Sinuses/Orbits: The orbits are within normal limits. The paranasal sinuses and mastoid air cells are well-aerated. Other: No significant soft tissue abnormalities are seen. CT CERVICAL SPINE FINDINGS Alignment: Normal. Skull base and vertebrae: No acute fracture. No primary bone lesion or focal pathologic process. Soft tissues and spinal canal: No prevertebral fluid or swelling. No visible canal hematoma. Disc levels: Multilevel disc space narrowing is noted throughout the cervical spine, with scattered anterior and posterior disc osteophyte complexes, and underlying facet disease. Upper chest: Emphysema is noted at the lung apices. The thyroid gland is unremarkable in appearance. Scattered calcification is seen at the carotid bifurcations bilaterally. Other: No  additional soft tissue abnormalities are seen. IMPRESSION: 1. No evidence of traumatic intracranial injury or fracture. 2. No evidence of fracture or subluxation along the cervical spine. 3. Moderate cortical volume loss and scattered small vessel ischemic microangiopathy. 4. Mild degenerative change throughout the cervical spine. 5. Emphysema at the lung apices. 6. Scattered calcification at the carotid bifurcations bilaterally. Carotid ultrasound would be helpful for further evaluation, when and as deemed clinically appropriate. Emphysema (ICD10-J43.9).  Electronically Signed   By: Garald Balding M.D.   On: 11/19/2018 21:18   Mr Brain Wo Contrast  Result Date: 12/01/2018 CLINICAL DATA:  85 year old male with episode of dizziness and fall at home. History of alcoholism. EXAM: MRI HEAD WITHOUT CONTRAST TECHNIQUE: Multiplanar, multiecho pulse sequences of the brain and surrounding structures were obtained without intravenous contrast. COMPARISON:  Head CTs 1114 hours today and earlier. FINDINGS: Brain: No restricted diffusion or evidence of acute infarction. But there is a small extra-axial space-occupying lesion along the superior right frontal convexity with conspicuous diffusion signal seen on series 3, image 42, series 7, image 24. This is subtle on T2 imaging but visible on FLAIR imaging and measuring about 14 millimeters diameter by 4 millimeters in thickness (series 5, image 21). In retrospect there is subtle evidence of this lesion on prior CTs (isodense) since at least August 2019, and stable since that time (series 8, image 36 on the study earlier today). No associated mass effect on the nearby right superior and middle frontal gyri. Cerebral volume loss with chronic ventriculomegaly which may be ex vacuo in nature. Volume loss appears generalized, with no disproportionate areas of cerebral atrophy identified. Mild for age nonspecific periventricular white matter T2 and FLAIR hyperintensity, most  pronounced in the atria and temporal white matter. There is a chronic microhemorrhage in the left medulla on series 4, image 12. No other chronic cerebral blood products. No cortical encephalomalacia. No midline shift, mass effect, ventriculomegaly, extra-axial fluid collection or acute intracranial hemorrhage. Cervicomedullary junction and pituitary are within normal limits. Vascular: Major intracranial vascular flow voids are preserved. Skull and upper cervical spine: Visualized bone marrow signal is within normal limits. Sinuses/Orbits: Negative orbits, postoperative changes to both globes. Paranasal Visualized paranasal sinuses and mastoids are stable and well pneumatized. Other: Visible internal auditory structures appear normal. Scalp and face soft tissues appear negative. IMPRESSION: 1. A small 14 x 4 mm right superior frontal convexity meningioma is suspected and probably clinically silent. 2. No acute intracranial abnormality. Chronic cerebral volume loss and ventricular enlargement which is probably ex vacuo in nature. Electronically Signed   By: Genevie Ann M.D.   On: 12/01/2018 17:06   Dg Shoulder Right Port  Result Date: 12/01/2018 CLINICAL DATA:  85 year old male status post fall with right shoulder pain. EXAM: PORTABLE RIGHT SHOULDER COMPARISON:  Chest radiographs earlier today. FINDINGS: There is no evidence of fracture or dislocation. No age advanced arthropathy or other focal bone abnormality. Calcified right subclavian/axillary region atherosclerosis. Negative visible right ribs and lung parenchyma. IMPRESSION: No acute fracture or dislocation identified about the right shoulder. Electronically Signed   By: Genevie Ann M.D.   On: 12/01/2018 22:50   Dg Hand Complete Right  Result Date: 12/01/2018 CLINICAL DATA:  Right hand pain. Fell today. EXAM: RIGHT HAND - COMPLETE 3+ VIEW COMPARISON:  None. FINDINGS: No evidence of fracture or dislocation. Mild inter phalangeal joint osteoarthritis. IMPRESSION:  No acute or traumatic finding. Mild osteoarthritis. Electronically Signed   By: Nelson Chimes M.D.   On: 12/01/2018 13:23   Ct Maxillofacial Wo Contrast  Result Date: 12/01/2018 CLINICAL DATA:  Fall.  Left jaw pain. EXAM: CT HEAD WITHOUT CONTRAST CT MAXILLOFACIAL WITHOUT CONTRAST CT CERVICAL SPINE WITHOUT CONTRAST TECHNIQUE: Multidetector CT imaging of the head, cervical spine, and maxillofacial structures were performed using the standard protocol without intravenous contrast. Multiplanar CT image reconstructions of the cervical spine and maxillofacial structures were also generated. COMPARISON:  CT head and cervical spine dated  November 19, 2018. FINDINGS: CT HEAD FINDINGS Brain: No evidence of acute infarction, hemorrhage, hydrocephalus, extra-axial collection or mass lesion/mass effect. Stable moderate atrophy and mild chronic microvascular ischemic changes. Vascular: Atherosclerotic vascular calcification of the carotid siphons. No hyperdense vessel. Skull: Normal. Negative for fracture or focal lesion. Other: None. CT MAXILLOFACIAL FINDINGS Osseous: The right maxillary cuspid tooth is now protruding through the maxillary roof, new when compared to prior study. No fracture or mandibular dislocation. No destructive process. Orbits: Negative. No traumatic or inflammatory finding. Sinuses: Minimal ethmoid air cell and maxillary sinus mucosal thickening. Remaining paranasal sinuses and mastoid air cells are clear. Soft tissues: Small amount of subcutaneous emphysema and fluid adjacent to the impacted right maxillary cuspid tooth root. CT CERVICAL SPINE FINDINGS Alignment: No traumatic malalignment. Unchanged trace anterolisthesis at C2-C3, C7-T1, T1-T2, and T2-T3. Skull base and vertebrae: No acute fracture. No primary bone lesion or focal pathologic process. Soft tissues and spinal canal: No prevertebral fluid or swelling. No visible canal hematoma. Disc levels: Unchanged moderate to severe multilevel cervical  spondylosis from C3-C4 through C6-C7. Upper chest: Paraseptal and centrilobular emphysema. Other: Bilateral carotid artery calcific atherosclerosis. IMPRESSION: CT head: 1.  No acute intracranial abnormality. 2. Stable moderate atrophy and mild chronic microvascular ischemic changes. CT maxillofacial: 1. New impaction of the right maxillary cuspid tooth with protrusion of the root through the maxillary roof. 2.  No acute maxillofacial fracture. CT cervical spine: 1.  No acute cervical spine fracture. 2. Unchanged moderate to severe multilevel cervical spondylosis. 3.  Emphysema (ICD10-J43.9). Electronically Signed   By: Titus Dubin M.D.   On: 12/01/2018 11:51       Subjective: Patient seen and examined at bedside this morning.  Remains comfortable.  Hemodynamically stable.  No active issues.  Stable for discharge  Discharge Exam: Vitals:   12/09/18 0400 12/09/18 0817  BP: 101/65 116/64  Pulse: 77 73  Resp: 15 16  Temp: 98 F (36.7 C) (!) 97.3 F (36.3 C)  SpO2: 97% 96%   Vitals:   12/09/18 0000 12/09/18 0008 12/09/18 0400 12/09/18 0817  BP: 105/62 105/62 101/65 116/64  Pulse:  79 77 73  Resp:  18 15 16   Temp:  98.1 F (36.7 C) 98 F (36.7 C) (!) 97.3 F (36.3 C)  TempSrc:  Oral Axillary Oral  SpO2:  96% 97% 96%  Weight:      Height:        General: Pt is alert, awake, not in acute distress Cardiovascular: RRR, S1/S2 +, no rubs, no gallops Respiratory: CTA bilaterally, no wheezing, no rhonchi Abdominal: Soft, NT, ND, bowel sounds + Extremities: no edema, no cyanosis    The results of significant diagnostics from this hospitalization (including imaging, microbiology, ancillary and laboratory) are listed below for reference.     Microbiology: Recent Results (from the past 240 hour(s))  Urine culture     Status: None   Collection Time: 12/01/18  7:40 PM  Result Value Ref Range Status   Specimen Description URINE, CLEAN CATCH  Final   Special Requests   Final     NONE Performed at [facility], [address], [place], [place] [address]    Culture   Final    Multiple bacterial morphotypes present, none predominant. Suggest appropriate recollection if clinically indicated.   Report Status 12/03/2018 FINAL  Final     Labs: BNP (last 3 results) No results for input(s): BNP in the last 8760 hours. Basic Metabolic Panel: Recent Labs  Lab 12/07/18 0627 12/08/18 0631  NA 140 142  K 3.2* 3.8  CL 107 110  CO2 20* 23  GLUCOSE 101* 123*  BUN 16 20  CREATININE 1.06 1.00  CALCIUM 8.6* 9.5   Liver Function Tests: No results for input(s): AST, ALT, ALKPHOS, BILITOT, PROT, ALBUMIN in the last 168 hours. No results for input(s): LIPASE, AMYLASE in the last 168 hours. No results for input(s): AMMONIA in the last 168 hours. CBC: Recent Labs  Lab 12/07/18 0627  WBC 4.9  NEUTROABS 3.2  HGB 12.6*  HCT 36.8*  MCV 102.2*  PLT 175   Cardiac Enzymes: No results for input(s): CKTOTAL, CKMB, CKMBINDEX, TROPONINI in the last 168 hours. BNP: Invalid input(s): POCBNP CBG: Recent Labs  Lab 12/07/18 0640 12/08/18 0640 12/08/18 0734 12/09/18 0602 12/09/18 0731  GLUCAP 94 109* 94 97 116*   D-Dimer No results for input(s): DDIMER in the last 72 hours. Hgb A1c No results for input(s): HGBA1C in the last 72 hours. Lipid Profile No results for input(s): CHOL, HDL, LDLCALC, TRIG, CHOLHDL, LDLDIRECT in the last 72 hours. Thyroid function studies No results for input(s): TSH, T4TOTAL, T3FREE, THYROIDAB in the last 72 hours.  Invalid input(s): FREET3 Anemia work up No results for input(s): VITAMINB12, FOLATE, FERRITIN, TIBC, IRON, RETICCTPCT in the last 72 hours. Urinalysis    Component Value Date/Time   COLORURINE YELLOW 12/01/2018 1850   APPEARANCEUR CLEAR 12/01/2018 1850   LABSPEC 1.023 12/01/2018 1850   PHURINE 6.0 12/01/2018 1850   GLUCOSEU 50 (A) 12/01/2018 1850   HGBUR NEGATIVE 12/01/2018 1850   BILIRUBINUR NEGATIVE 12/01/2018  1850   KETONESUR 5 (A) 12/01/2018 1850   PROTEINUR 30 (A) 12/01/2018 1850   UROBILINOGEN 1.0 08/05/2015 1155   NITRITE NEGATIVE 12/01/2018 1850   LEUKOCYTESUR NEGATIVE 12/01/2018 1850   Sepsis Labs Invalid input(s): PROCALCITONIN,  WBC,  LACTICIDVEN Microbiology Recent Results (from the past 240 hour(s))  Urine culture     Status: None   Collection Time: 12/01/18  7:40 PM  Result Value Ref Range Status   Specimen Description URINE, CLEAN CATCH  Final   Special Requests   Final    NONE Performed at [facility], [place] [address] [place], [place] [address]    Culture   Final    Multiple bacterial morphotypes present, none predominant. Suggest appropriate recollection if clinically indicated.   Report Status 12/03/2018 FINAL  Final    Please note: You were cared for by a hospitalist during your hospital stay. Once you are discharged, your primary care physician will handle any further medical issues. Please note that NO REFILLS for any discharge medications will be authorized once you are discharged, as it is imperative that you return to your primary care physician (or establish a relationship with a primary care physician if you do not have one) for your post hospital discharge needs so that they can reassess your need for medications and monitor your lab values.    Time coordinating discharge: 40 minutes  SIGNED:   Shelly Coss, MD  [facility] 12/09/2018, 10:50 AM Pager [phone]  If 7PM-7AM, please contact night-coverage [email or web address] Password [email or web address]

## 2018-12-09 NOTE — Progress Notes (Signed)
CSW following for discharge plan. CSW faxed updated PT note to [facility] admissions and contacted them to discuss patient's progress.  [facility] sent a nurse out to evaluate patient at bedside at 11:00 this morning. [facility] is able to admit patient, but want to pursue placement in their secure memory care unit with the patient's current mental status.   Admissions at [facility] will need information on power of attorney paperwork to know who to make decisions for the patient while he is confused. CSW contacted patient's niece, [name], to discuss patient's progress and ask about power of attorney paperwork. Per [name], they have paperwork completed for her dad (patient's brother) as power of attorney but she is unsure if it's full POA or just HCPOA. [name] to fax paperwork to [place].   Admissions to contact family to discuss and complete paperwork. CSW to follow.  Laveda Abbe, [place] Clinical Social Worker [phone]

## 2018-12-09 NOTE — NC FL2 (Signed)
[place] LEVEL OF CARE SCREENING TOOL     IDENTIFICATION  Patient Name: Victor Clarke Birthdate: 26-Jan-1929 Sex: male Admission Date (Current Location): 12/01/2018  [facility] and [place] Number:  Herbalist and Address:  [facility] [place] [address], [place], [place] [address]      Provider Number: [id number]  Attending Physician Name and Address:  Shelly Coss, MD  Relative Name and Phone Number:       Current Level of Care: Hospital Recommended Level of Care: Memory Care Prior Approval Number:    Date Approved/Denied:   PASRR Number:    Discharge Plan: Other (Comment)(Memory Care)    Current Diagnoses: Patient Active Problem List   Diagnosis Date Noted  . Problematic consumption of alcohol 12/01/2018  . Tooth impaction 12/01/2018  . Ataxia 12/01/2018  . First degree AV block   . Hypotension due to hypovolemia   . Syncope 06/18/2018  . Leukopenia 06/18/2018  . AV block, [place] 06/18/2018  . Memory loss 10/22/2016  . Malnutrition of moderate degree 11/15/2015  . Fall from steps 11/07/2015  . Anemia 11/07/2015  . Alcohol dependence ([place]) 11/07/2015  . Pyuria 11/07/2015  . Dehydration 08/08/2015  . UTI (urinary tract infection) 08/08/2015  . High anion gap metabolic acidosis 28/78/6767  . Dizziness 07/31/2015  . Orthostatic hypotension 07/31/2015  . Lactic acidosis 07/31/2015  . Ectopic atrial tachycardia ([place])   . Hypercholesteremia   . Dyspnea on exertion 09/16/2014  . Hypertension 09/16/2014    Orientation RESPIRATION BLADDER Height & Weight     Self  Normal Continent, Incontinent(incontinent at times) Weight: 136 lb 11 oz (62 kg) Height:  5\' 9"  (175.3 cm)  BEHAVIORAL SYMPTOMS/MOOD NEUROLOGICAL BOWEL NUTRITION STATUS      Continent, Incontinent(incontinent at times) Diet(see DC summary)  AMBULATORY STATUS COMMUNICATION OF NEEDS Skin   Limited Assist Verbally Skin abrasions                        Personal Care Assistance Level of Assistance  Bathing, Feeding, Dressing Bathing Assistance: Limited assistance Feeding assistance: Independent Dressing Assistance: Limited assistance     Functional Limitations Info  Sight, Hearing, Speech Sight Info: Adequate Hearing Info: Impaired Speech Info: Adequate    SPECIAL CARE FACTORS FREQUENCY  PT (By licensed PT), OT (By licensed OT)     PT Frequency: 3x/wk with home health OT Frequency: 3x/wk with home health            Contractures Contractures Info: Not present    Additional Factors Info  Code Status, Allergies Code Status Info: DNR Allergies Info: NKA           Current Medications (12/09/2018):  This is the current hospital active medication list Current Facility-Administered Medications  Medication Dose Route Frequency Provider Last Rate Last Dose  . acetaminophen (TYLENOL) tablet 650 mg  650 mg Oral Q6H PRN Opyd, Ilene Qua, MD       Or  . acetaminophen (TYLENOL) suppository 650 mg  650 mg Rectal Q6H PRN Opyd, Ilene Qua, MD      . amLODipine (NORVASC) tablet 5 mg  5 mg Oral Daily Adhikari, Amrit, MD   5 mg at 12/09/18 1151   And  . benazepril (LOTENSIN) tablet 20 mg  20 mg Oral Daily Shelly Coss, MD   20 mg at 12/09/18 1151  . aspirin EC tablet 81 mg  81 mg Oral Q M,W,F Opyd, Ilene Qua, MD  81 mg at 12/09/18 1152  . feeding supplement (ENSURE ENLIVE) (ENSURE ENLIVE) liquid 237 mL  237 mL Oral BID Opyd, Ilene Qua, MD   237 mL at 12/09/18 1100  . fluconazole (DIFLUCAN) tablet 150 mg  150 mg Oral Once Shelly Coss, MD      . folic acid (FOLVITE) tablet 1 mg  1 mg Oral Daily Opyd, Ilene Qua, MD   1 mg at 12/09/18 1151  . HYDROcodone-acetaminophen (NORCO/VICODIN) 5-325 MG per tablet 0.5-1 tablet  0.5-1 tablet Oral Q4H PRN Opyd, Ilene Qua, MD   0.5 tablet at 12/05/18 0634  . Influenza vac split quadrivalent PF (FLUZONE HIGH-DOSE) injection 0.5 mL  0.5 mL Intramuscular Tomorrow-1000 Adhikari, Amrit, MD      .  magnesium oxide (MAG-OX) tablet 400 mg  400 mg Oral Daily Opyd, Ilene Qua, MD   400 mg at 12/09/18 1151  . multivitamin with minerals tablet 1 tablet  1 tablet Oral Daily Opyd, Ilene Qua, MD   1 tablet at 12/09/18 1151  . nystatin cream (MYCOSTATIN)   Topical BID Adhikari, Amrit, MD      . ondansetron (ZOFRAN) tablet 4 mg  4 mg Oral Q6H PRN Opyd, Ilene Qua, MD       Or  . ondansetron (ZOFRAN) injection 4 mg  4 mg Intravenous Q6H PRN Opyd, Ilene Qua, MD      . phenol (CHLORASEPTIC) mouth spray 1 spray  1 spray Mouth/Throat PRN Shelly Coss, MD   1 spray at 12/06/18 1842  . polyethylene glycol (MIRALAX / GLYCOLAX) packet 17 g  17 g Oral Daily PRN Opyd, Ilene Qua, MD      . rosuvastatin (CRESTOR) tablet 20 mg  20 mg Oral q1800 Opyd, Ilene Qua, MD   20 mg at 12/08/18 1803  . tuberculin injection 5 Units  5 Units Intradermal Once Shelly Coss, MD         Discharge Medications: Please see discharge summary for a list of discharge medications.  Relevant Imaging Results:  Relevant Lab Results:   Additional Information SS#: [id number]  Geralynn Ochs, LCSW

## 2018-12-10 LAB — GLUCOSE, CAPILLARY: GLUCOSE-CAPILLARY: 94 mg/dL (ref 70–99)

## 2018-12-10 NOTE — Progress Notes (Addendum)
PROGRESS NOTE    Victor Clarke  ZOX:[id number] DOB: July 14, 1929 DOA: 12/01/2018 PCP: Sherald Hess., MD   Brief Narrative: Patient is a 85 year old male with history of hypertension, alcohol dependence, COPD who presented to the emergency department after a fall at home. Ethanol level was elevated to 196.Found to be ataxic on presentation. Gait difficulty suspected to be from alcoholism and thiamine deficiency. Currently managing for ataxia, altered mental status, alcohol withdrawal. His mental status has significantly improved . He is medically stable  for discharge to  memory care center Assessment & Plan:   Principal Problem:   Ataxia Active Problems:   Hypertension   Problematic consumption of alcohol     Ataxia: Suspected from with alcohol intoxication/thiamine deficiency. MRI of the brain did not show any acute intracranial findings. Started on high-dose thiamine supplementation.Continue thiamine oral on discharge. TSH and vitamin B-12 level normal.  Fall:Reported to have fallen at home. Lacerated his chin,lip. PT evaluated him and recommended skilled nursing facility on discharge. Social worker consulted and following  Alcohol dependence: Long history of alcohol abuse. Intoxicated on presentation. Continue thiamine and folic acid.   Alcohol withdrawal/Delirium: Mental status has significantly improved this morning. He is currently alert and oriented.   Hypertension:Currently stable. Continue amlodipine-benazepril.       DVT prophylaxis: SCD Code Status:DNR Family Communication: None present at the bedside Disposition Plan: Memory care center as soon as bed is available   Consultants: None  Procedures: None  Antimicrobials: None  Subjective: Patient seen and examined the bedside this morning.  Remains comfortable.  Alert and oriented.  Waiting for memory  care center bed  Objective: Vitals:   12/09/18 1953 12/10/18 0000 12/10/18  0256 12/10/18 0727  BP: 120/74 (!) 100/57 110/67 110/73  Pulse: 66 64 67 71  Resp: 15 17 14 18   Temp: 97.7 F (36.5 C) 98.2 F (36.8 C) 97.8 F (36.6 C) 98.3 F (36.8 C)  TempSrc: Oral Oral Oral Oral  SpO2: 98% 93% 97% 98%  Weight:      Height:        Intake/Output Summary (Last 24 hours) at 12/10/2018 1143 Last data filed at 12/10/2018 0900 Gross per 24 hour  Intake 630 ml  Output 377 ml  Net 253 ml   Filed Weights   12/01/18 2158  Weight: 62 kg    Examination:     General exam: Elderly debilitated male  HEENT:PERRL,Oral mucosa moist, Ear/Nose normal on gross exam Respiratory system: Bilateral equal air entry, normal vesicular breath sounds, no wheezes or crackles  Cardiovascular system: S1 & S2 heard, RRR. No JVD, murmurs, rubs, gallops or clicks. Gastrointestinal system: Abdomen is nondistended, soft and nontender. No organomegaly or masses felt. Normal bowel sounds heard. Central nervous system: Alert and oriented. No focal neurological deficits. Extremities: No edema, no clubbing ,no cyanosis, distal peripheral pulses palpable. Skin: No rashes, lesions or ulcers,no icterus ,no pallor     Data Reviewed: I have personally reviewed following labs and imaging studies  CBC: Recent Labs  Lab 12/07/18 0627  WBC 4.9  NEUTROABS 3.2  HGB 12.6*  HCT 36.8*  MCV 102.2*  PLT 811   Basic Metabolic Panel: Recent Labs  Lab 12/07/18 0627 12/08/18 0631  NA 140 142  K 3.2* 3.8  CL 107 110  CO2 20* 23  GLUCOSE 101* 123*  BUN 16 20  CREATININE 1.06 1.00  CALCIUM 8.6* 9.5   GFR: Estimated Creatinine Clearance: 45.6 mL/min (by C-G formula based on SCr  of 1 mg/dL). Liver Function Tests: No results for input(s): AST, ALT, ALKPHOS, BILITOT, PROT, ALBUMIN in the last 168 hours. No results for input(s): LIPASE, AMYLASE in the last 168 hours. No results for input(s): AMMONIA in the last 168 hours. Coagulation Profile: No results for input(s): INR, PROTIME in the last  168 hours. Cardiac Enzymes: No results for input(s): CKTOTAL, CKMB, CKMBINDEX, TROPONINI in the last 168 hours. BNP (last 3 results) No results for input(s): PROBNP in the last 8760 hours. HbA1C: No results for input(s): HGBA1C in the last 72 hours. CBG: Recent Labs  Lab 12/08/18 0640 12/08/18 0734 12/09/18 0602 12/09/18 0731 12/10/18 0724  GLUCAP 109* 94 97 116* 94   Lipid Profile: No results for input(s): CHOL, HDL, LDLCALC, TRIG, CHOLHDL, LDLDIRECT in the last 72 hours. Thyroid Function Tests: No results for input(s): TSH, T4TOTAL, FREET4, T3FREE, THYROIDAB in the last 72 hours. Anemia Panel: No results for input(s): VITAMINB12, FOLATE, FERRITIN, TIBC, IRON, RETICCTPCT in the last 72 hours. Sepsis Labs: No results for input(s): PROCALCITON, LATICACIDVEN in the last 168 hours.  Recent Results (from the past 240 hour(s))  Urine culture     Status: None   Collection Time: 12/01/18  7:40 PM  Result Value Ref Range Status   Specimen Description URINE, CLEAN CATCH  Final   Special Requests   Final    NONE Performed at [facility], [address], [place], [place] [address]    Culture   Final    Multiple bacterial morphotypes present, none predominant. Suggest appropriate recollection if clinically indicated.   Report Status 12/03/2018 FINAL  Final         Radiology Studies: No results found.      Scheduled Meds: . amLODipine  5 mg Oral Daily   And  . benazepril  20 mg Oral Daily  . aspirin EC  81 mg Oral Q M,W,F  . feeding supplement (ENSURE ENLIVE)  237 mL Oral BID  . fluconazole  150 mg Oral Once  . folic acid  1 mg Oral Daily  . Influenza vac split quadrivalent PF  0.5 mL Intramuscular Tomorrow-1000  . magnesium oxide  400 mg Oral Daily  . multivitamin with minerals  1 tablet Oral Daily  . nystatin cream   Topical BID  . rosuvastatin  20 mg Oral q1800  . tuberculin  5 Units Intradermal Once   Continuous Infusions:    LOS: 8 days    Time  spent:25 mins. More than 50% of that time was spent in counseling and/or coordination of care.      Shelly Coss, MD [facility] Pager [phone]  If 7PM-7AM, please contact night-coverage [email or web address] Password [facility] 12/10/2018, 11:43 AM

## 2018-12-10 NOTE — Care Management Important Message (Signed)
Important Message  Patient Details  Name: Victor Clarke MRN: [id number] Date of Birth: January 15, 1929   Medicare Important Message Given:  Yes    Amybeth Sieg 12/10/2018, 2:51 PM

## 2018-12-10 NOTE — Progress Notes (Signed)
Physical Therapy Treatment Patient Details Name: Victor Clarke MRN: [id number] DOB: 01/07/1929 Today's Date: 12/10/2018    History of Present Illness Pt is an 85 y/o male admitted secondary to sustaining a fall at home. Pt found to be intoxicated upon arrival. Of note, pt's MRI of his brain revealed a small 14 x 4 mm right superior frontal convexity meningioma that is probably clinically silent. PMH including but not limited to Parkinson's, HTN, COPD and alcohol dependence.     PT Comments    Pt making fair progress with functional mobility.  He continues to appear to be at an increased risk for falls with history of multiple falls; therefore, continue to recommend pt d/c to SNF unless his ALF can provide 24/7 supervision and the assistance that he requires. Pt would continue to benefit from skilled physical therapy services at this time while admitted and after d/c to address the below listed limitations in order to improve overall safety and independence with functional mobility.   Follow Up Recommendations  SNF     Equipment Recommendations  None recommended by PT    Recommendations for Other Services       Precautions / Restrictions Precautions Precautions: Fall Restrictions Weight Bearing Restrictions: No    Mobility  Bed Mobility Overal bed mobility: Needs Assistance Bed Mobility: Supine to Sit;Sit to Supine     Supine to sit: Mod assist Sit to supine: Min guard   General bed mobility comments: increased time and effort, use of bed rail, mod A for trunk elevation, min guard to return to supine  Transfers Overall transfer level: Needs assistance Equipment used: Rolling walker (2 wheeled) Transfers: Sit to/from Stand Sit to Stand: Min guard         General transfer comment: cueing for safe hand placement, min guard for safety, pt performed x2 from EOB  Ambulation/Gait Ambulation/Gait assistance: Min assist;Min guard Gait Distance (Feet): 100 Feet(100' x2 with  sitting rest break in between) Assistive device: Rolling walker (2 wheeled) Gait Pattern/deviations: Step-through pattern Gait velocity: decreased   General Gait Details: pt with modest instability with RW, cueing to maintain a safe distance and for navigation around obstacles   Stairs             Wheelchair Mobility    Modified Rankin (Stroke Patients Only)       Balance Overall balance assessment: Needs assistance Sitting-balance support: Feet supported Sitting balance-Leahy Scale: Good     Standing balance support: Single extremity supported;Bilateral upper extremity supported Standing balance-Leahy Scale: Poor                              Cognition Arousal/Alertness: Awake/alert Behavior During Therapy: WFL for tasks assessed/performed Overall Cognitive Status: History of cognitive impairments - at baseline Area of Impairment: Safety/judgement;Problem solving                         Safety/Judgement: Decreased awareness of safety   Problem Solving: Slow processing        Exercises      General Comments        Pertinent Vitals/Pain Pain Assessment: No/denies pain    Home Living                      Prior Function            PT Goals (current goals can now be found in the  care plan section) Acute Rehab PT Goals PT Goal Formulation: With patient Time For Goal Achievement: 12/16/18 Potential to Achieve Goals: Fair Progress towards PT goals: Progressing toward goals    Frequency    Min 2X/week      PT Plan Current plan remains appropriate;Discharge plan needs to be updated    Co-evaluation              AM-PAC PT "6 Clicks" Mobility   Outcome Measure  Help needed turning from your back to your side while in a flat bed without using bedrails?: A Little Help needed moving from lying on your back to sitting on the side of a flat bed without using bedrails?: A Lot Help needed moving to and from a bed  to a chair (including a wheelchair)?: A Little Help needed standing up from a chair using your arms (e.g., wheelchair or bedside chair)?: None Help needed to walk in hospital room?: A Little Help needed climbing 3-5 steps with a railing? : A Little 6 Click Score: 18    End of Session Equipment Utilized During Treatment: Gait belt Activity Tolerance: Patient tolerated treatment well Patient left: in bed;with call bell/phone within reach;with bed alarm set Nurse Communication: Mobility status PT Visit Diagnosis: Other abnormalities of gait and mobility (R26.89);Unsteadiness on feet (R26.81)     Time: 0315-9458 PT Time Calculation (min) (ACUTE ONLY): 23 min  Charges:  $Gait Training: 23-37 mins                     Sherie Don, [place], DPT  Acute Rehabilitation Services Pager [phone] Office [place] 12/10/2018, 11:53 AM

## 2018-12-10 NOTE — Progress Notes (Signed)
Initial Nutrition Assessment  DOCUMENTATION CODES:   Not applicable  INTERVENTION:  Continue Ensure Enlive po BID, each supplement provides 350 kcal and 20 grams of protein.  Encourage adequate PO intake.   NUTRITION DIAGNOSIS:   Increased nutrient needs related to chronic illness(COPD) as evidenced by estimated needs.  GOAL:   Patient will meet greater than or equal to 90% of their needs  MONITOR:   PO intake, Supplement acceptance, Labs, Weight trends, I & O's, Skin  REASON FOR ASSESSMENT:   Malnutrition Screening Tool    ASSESSMENT:   85 year old male with history of hypertension, alcohol dependence, COPD who presented to the emergency department after a fall at home.  Ethanol level was elevated to 196. Found to be ataxic on presentation.  Per MD, pt medically stable to discharge to memory care center. Currently awaiting bed availability. Meal completion has been 50-85%. Pt reports PTA, he would occasionally skip meals due to poor appetite, however tries to maintain adequate nutrition most of the times by forcing himself to eat 3 meals a day. Pt unable to quantify food usually eaten at meals. Per weight records, pt with a  weight loss in 1 month, which is significant for time frame. Pt currently has Ensure ordered and has been consuming them. RD to continue with current orders to aid in caloric and protein needs. Labs and medications reviewed.   NUTRITION - FOCUSED PHYSICAL EXAM:  Depletion may be related to the natural aging process.     Most Recent Value  Orbital Region  Unable to assess  Upper Arm Region  No depletion  Thoracic and Lumbar Region  Mild depletion  Buccal Region  Unable to assess  Temple Region  Unable to assess  Clavicle Bone Region  Moderate depletion  Clavicle and Acromion Bone Region  Moderate depletion  Scapular Bone Region  Unable to assess  Dorsal Hand  Moderate depletion  Patellar Region  Moderate depletion  Anterior Thigh Region  Moderate  depletion  Posterior Calf Region  Mild depletion  Edema (RD Assessment)  None  Hair  Reviewed  Eyes  Reviewed  Mouth  Reviewed  Skin  Reviewed  Nails  Reviewed       Diet Order:   Diet Order            Diet - low sodium heart healthy        Diet Heart Room service appropriate? Yes; Fluid consistency: Thin  Diet effective now              EDUCATION NEEDS:   Not appropriate for education at this time  Skin:  Skin Assessment: Reviewed RN Assessment  Last BM:  1/22  Height:   Ht Readings from Last 1 Encounters:  12/01/18 5\' 9"  (1.753 m)    Weight:   Wt Readings from Last 1 Encounters:  12/01/18 62 kg    Ideal Body Weight:  72.7 kg  BMI:  Body mass index is 20.18 kg/m.  Estimated Nutritional Needs:   Kcal:  1550-1750  Protein:  75-85 grams  Fluid:  >/= 1.5 L/day    Corrin Parker, MS, RD, LDN Pager # [phone] After hours/ weekend pager # [phone]

## 2018-12-11 LAB — GLUCOSE, CAPILLARY: Glucose-Capillary: 100 mg/dL — ABNORMAL HIGH (ref 70–99)

## 2018-12-11 NOTE — Progress Notes (Signed)
PROGRESS NOTE    Victor Clarke  PZW:[id number] DOB: 1929/05/06 DOA: 12/01/2018 PCP: Sherald Hess., MD   Brief Narrative: Patient is a 85 year old male with history of hypertension, alcohol dependence, COPD who presented to the emergency department after a fall at home. Ethanol level was elevated to 196.Found to be ataxic on presentation. Gait difficulty suspected to be from alcoholism and thiamine deficiency. He was managed  for ataxia, altered mental status, alcohol withdrawal.  Treated with high-dose thiamine. His mental status has significantly improved . He is medically stable  for discharge to  memory care center.  Assessment & Plan:   Principal Problem:   Ataxia Active Problems:   Hypertension   Problematic consumption of alcohol     Ataxia: Suspected from with alcohol intoxication/thiamine deficiency. MRI of the brain did not show any acute intracranial findings. Started on high-dose thiamine supplementation and he completed the course.Continue thiamine oral on discharge. TSH and vitamin B-12 level normal.  Fall:Reported to have fallen at home. Lacerated his chin,lip. PT evaluated him and recommended skilled nursing facility on discharge. Social worker consulted and following  Alcohol dependence: Long history of alcohol abuse. Intoxicated on presentation. Continue thiamine and folic acid.   Alcohol withdrawal/Delirium: Mental status has significantly improved . He is currently alert and oriented.   Hypertension:Currently stable. Continue amlodipine-benazepril.       DVT prophylaxis: SCD Code Status:DNR Family Communication: None present at the bedside Disposition Plan: Memory care center as soon as bed is available   Consultants: None  Procedures: None  Antimicrobials: None  Subjective: Patient seen and examined the bedside this morning.  Remains comfortable.  Hemodynamically stable.  Alert and oriented.  Denies any complaints  this morning. Objective: Vitals:   12/10/18 2340 12/11/18 0415 12/11/18 0801 12/11/18 1225  BP: 111/71 111/76 120/72 107/90  Pulse: 75 74 74 76  Resp: 18 18 18 18   Temp: 97.8 F (36.6 C) 98 F (36.7 C) 97.8 F (36.6 C) 98.1 F (36.7 C)  TempSrc: Oral Oral Oral Oral  SpO2: 96% 98% 96% 100%  Weight:      Height:        Intake/Output Summary (Last 24 hours) at 12/11/2018 1304 Last data filed at 12/11/2018 0000 Gross per 24 hour  Intake -  Output 600 ml  Net -600 ml   Filed Weights   12/01/18 2158  Weight: 62 kg    Examination:    General exam: Elderly debilitated male  HEENT:PERRL,Oral mucosa moist, Ear/Nose normal on gross exam Respiratory system: Bilateral equal air entry, normal vesicular breath sounds, no wheezes or crackles  Cardiovascular system: S1 & S2 heard, RRR. No JVD, murmurs, rubs, gallops or clicks. Gastrointestinal system: Abdomen is nondistended, soft and nontender. No organomegaly or masses felt. Normal bowel sounds heard. Central nervous system: Alert and oriented. No focal neurological deficits. Extremities: No edema, no clubbing ,no cyanosis, distal peripheral pulses palpable. Skin: Scattered ecchymosis/bruises     Data Reviewed: I have personally reviewed following labs and imaging studies  CBC: Recent Labs  Lab 12/07/18 0627  WBC 4.9  NEUTROABS 3.2  HGB 12.6*  HCT 36.8*  MCV 102.2*  PLT 423   Basic Metabolic Panel: Recent Labs  Lab 12/07/18 0627 12/08/18 0631  NA 140 142  K 3.2* 3.8  CL 107 110  CO2 20* 23  GLUCOSE 101* 123*  BUN 16 20  CREATININE 1.06 1.00  CALCIUM 8.6* 9.5   GFR: Estimated Creatinine Clearance: 45.6 mL/min (by C-G formula  based on SCr of 1 mg/dL). Liver Function Tests: No results for input(s): AST, ALT, ALKPHOS, BILITOT, PROT, ALBUMIN in the last 168 hours. No results for input(s): LIPASE, AMYLASE in the last 168 hours. No results for input(s): AMMONIA in the last 168 hours. Coagulation Profile: No  results for input(s): INR, PROTIME in the last 168 hours. Cardiac Enzymes: No results for input(s): CKTOTAL, CKMB, CKMBINDEX, TROPONINI in the last 168 hours. BNP (last 3 results) No results for input(s): PROBNP in the last 8760 hours. HbA1C: No results for input(s): HGBA1C in the last 72 hours. CBG: Recent Labs  Lab 12/08/18 0734 12/09/18 0602 12/09/18 0731 12/10/18 0724 12/11/18 0619  GLUCAP 94 97 116* 94 100*   Lipid Profile: No results for input(s): CHOL, HDL, LDLCALC, TRIG, CHOLHDL, LDLDIRECT in the last 72 hours. Thyroid Function Tests: No results for input(s): TSH, T4TOTAL, FREET4, T3FREE, THYROIDAB in the last 72 hours. Anemia Panel: No results for input(s): VITAMINB12, FOLATE, FERRITIN, TIBC, IRON, RETICCTPCT in the last 72 hours. Sepsis Labs: No results for input(s): PROCALCITON, LATICACIDVEN in the last 168 hours.  Recent Results (from the past 240 hour(s))  Urine culture     Status: None   Collection Time: 12/01/18  7:40 PM  Result Value Ref Range Status   Specimen Description URINE, CLEAN CATCH  Final   Special Requests   Final    NONE Performed at [facility], [address] [place], [place] [address]    Culture   Final    Multiple bacterial morphotypes present, none predominant. Suggest appropriate recollection if clinically indicated.   Report Status 12/03/2018 FINAL  Final         Radiology Studies: No results found.      Scheduled Meds: . amLODipine  5 mg Oral Daily   And  . benazepril  20 mg Oral Daily  . aspirin EC  81 mg Oral Q M,W,F  . feeding supplement (ENSURE ENLIVE)  237 mL Oral BID  . fluconazole  150 mg Oral Once  . folic acid  1 mg Oral Daily  . Influenza vac split quadrivalent PF  0.5 mL Intramuscular Tomorrow-1000  . magnesium oxide  400 mg Oral Daily  . multivitamin with minerals  1 tablet Oral Daily  . nystatin cream   Topical BID  . rosuvastatin  20 mg Oral q1800  . tuberculin  5 Units Intradermal Once    Continuous Infusions:    LOS: 9 days    Time spent:25 mins. More than 50% of that time was spent in counseling and/or coordination of care.      Shelly Coss, MD [facility] Pager [phone]  If 7PM-7AM, please contact night-coverage [email or web address] Password [email or web address] 12/11/2018, 1:04 PM

## 2018-12-11 NOTE — Progress Notes (Signed)
Discharge to: [place] Anticipated discharge date: 12/11/18 Family notified: August Albino, by phone Transportation by: [name]  Report #: [phone]  [place] signing off.  Laveda Abbe LCSW [phone]

## 2018-12-11 NOTE — Progress Notes (Signed)
Pt TB test negative 12/11/2018 1700.  Nikki Dom  RN BSN

## 2018-12-11 NOTE — Progress Notes (Signed)
Pt discharged and transported by [name] to [facility], all personal belongings taken with him. Discharge documentation completed earlier today by the day nurse.

## 2018-12-11 NOTE — NC FL2 (Addendum)
[place] LEVEL OF CARE SCREENING TOOL     IDENTIFICATION  Patient Name: Victor Clarke Birthdate: Oct 31, 1929 Sex: male Admission Date (Current Location): 12/01/2018  [facility] and [place] Number:  Herbalist and Address:  [facility], [place] [address] [place], [place] [address]      Provider Number: [id number]  Attending Physician Name and Address:  Shelly Coss, MD  Relative Name and Phone Number:       Current Level of Care: Hospital Recommended Level of Care: [place] Prior Approval Number:    Date Approved/Denied:   PASRR Number:    Discharge Plan: Other (Comment)(ALF)    Current Diagnoses: Patient Active Problem List   Diagnosis Date Noted  . Problematic consumption of alcohol 12/01/2018  . Tooth impaction 12/01/2018  . Ataxia 12/01/2018  . First degree AV block   . Hypotension due to hypovolemia   . Syncope 06/18/2018  . Leukopenia 06/18/2018  . AV block, [place] II 06/18/2018  . Memory loss 10/22/2016  . Malnutrition of moderate degree 11/15/2015  . Fall from steps 11/07/2015  . Anemia 11/07/2015  . Alcohol dependence ([place]) 11/07/2015  . Pyuria 11/07/2015  . Dehydration 08/08/2015  . UTI (urinary tract infection) 08/08/2015  . High anion gap metabolic acidosis 51/76/1607  . Dizziness 07/31/2015  . Orthostatic hypotension 07/31/2015  . Lactic acidosis 07/31/2015  . Ectopic atrial tachycardia ([place])   . Hypercholesteremia   . Dyspnea on exertion 09/16/2014  . Hypertension 09/16/2014    Orientation RESPIRATION BLADDER Height & Weight     Self, Time, Situation, Place  Normal Continent, Incontinent(incontinent at times) Weight: 136 lb 11 oz (62 kg) Height:  5\' 9"  (175.3 cm)  BEHAVIORAL SYMPTOMS/MOOD NEUROLOGICAL BOWEL NUTRITION STATUS      Continent, Incontinent(incontinent at times) Diet(see DC summary)  AMBULATORY STATUS COMMUNICATION OF NEEDS Skin   Limited Assist Verbally Skin  abrasions                       Personal Care Assistance Level of Assistance  Bathing, Feeding, Dressing Bathing Assistance: Limited assistance Feeding assistance: Independent Dressing Assistance: Limited assistance     Functional Limitations Info  Sight, Hearing, Speech Sight Info: Adequate Hearing Info: Impaired Speech Info: Adequate    SPECIAL CARE FACTORS FREQUENCY  PT (By licensed PT), OT (By licensed OT)     PT Frequency: 3x/wk with home health OT Frequency: 3x/wk with home health            Contractures Contractures Info: Not present    Additional Factors Info  Code Status, Allergies Code Status Info: DNR Allergies Info: NKA           Current Medications (12/11/2018):  This is the current hospital active medication list Current Facility-Administered Medications  Medication Dose Route Frequency Provider Last Rate Last Dose  . acetaminophen (TYLENOL) tablet 650 mg  650 mg Oral Q6H PRN Opyd, Ilene Qua, MD   650 mg at 12/09/18 2108   Or  . acetaminophen (TYLENOL) suppository 650 mg  650 mg Rectal Q6H PRN Opyd, Ilene Qua, MD      . amLODipine (NORVASC) tablet 5 mg  5 mg Oral Daily Adhikari, Amrit, MD   5 mg at 12/11/18 1203   And  . benazepril (LOTENSIN) tablet 20 mg  20 mg Oral Daily Shelly Coss, MD   20 mg at 12/11/18 1157  . aspirin EC tablet 81 mg  81 mg Oral  Q M,W,F Vianne Bulls, MD   81 mg at 12/11/18 1157  . feeding supplement (ENSURE ENLIVE) (ENSURE ENLIVE) liquid 237 mL  237 mL Oral BID Vianne Bulls, MD   237 mL at 12/10/18 2117  . fluconazole (DIFLUCAN) tablet 150 mg  150 mg Oral Once Shelly Coss, MD      . folic acid (FOLVITE) tablet 1 mg  1 mg Oral Daily Opyd, Ilene Qua, MD   1 mg at 12/11/18 1157  . HYDROcodone-acetaminophen (NORCO/VICODIN) 5-325 MG per tablet 0.5-1 tablet  0.5-1 tablet Oral Q4H PRN Opyd, Ilene Qua, MD   0.5 tablet at 12/05/18 0634  . Influenza vac split quadrivalent PF (FLUZONE HIGH-DOSE) injection 0.5 mL  0.5 mL  Intramuscular Tomorrow-1000 Adhikari, Amrit, MD      . magnesium oxide (MAG-OX) tablet 400 mg  400 mg Oral Daily Opyd, Ilene Qua, MD   400 mg at 12/11/18 1157  . multivitamin with minerals tablet 1 tablet  1 tablet Oral Daily Opyd, Ilene Qua, MD   1 tablet at 12/11/18 1156  . nystatin cream (MYCOSTATIN)   Topical BID Shelly Coss, MD      . ondansetron (ZOFRAN) tablet 4 mg  4 mg Oral Q6H PRN Opyd, Ilene Qua, MD       Or  . ondansetron (ZOFRAN) injection 4 mg  4 mg Intravenous Q6H PRN Opyd, Ilene Qua, MD      . phenol (CHLORASEPTIC) mouth spray 1 spray  1 spray Mouth/Throat PRN Shelly Coss, MD   1 spray at 12/06/18 1842  . polyethylene glycol (MIRALAX / GLYCOLAX) packet 17 g  17 g Oral Daily PRN Opyd, Ilene Qua, MD      . rosuvastatin (CRESTOR) tablet 20 mg  20 mg Oral q1800 Opyd, Ilene Qua, MD   20 mg at 12/10/18 1726  . tuberculin injection 5 Units  5 Units Intradermal Once Shelly Coss, MD   5 Units at 12/09/18 1900     Discharge Medications: Please see discharge summary for a list of discharge medications.  Relevant Imaging Results:  Relevant Lab Results:   Additional Information SS#: [id number]  Geralynn Ochs, LCSW

## 2019-01-05 ENCOUNTER — Encounter: Payer: Self-pay | Admitting: *Deleted

## 2019-01-05 ENCOUNTER — Encounter: Payer: Self-pay | Admitting: Neurology

## 2019-01-05 ENCOUNTER — Ambulatory Visit (INDEPENDENT_AMBULATORY_CARE_PROVIDER_SITE_OTHER): Payer: Medicare Other | Admitting: Neurology

## 2019-01-05 VITALS — BP 108/63 | HR 74 | Ht 69.5 in | Wt 145.0 lb

## 2019-01-05 DIAGNOSIS — Z7289 Other problems related to lifestyle: Secondary | ICD-10-CM

## 2019-01-05 DIAGNOSIS — G621 Alcoholic polyneuropathy: Secondary | ICD-10-CM

## 2019-01-05 DIAGNOSIS — R4189 Other symptoms and signs involving cognitive functions and awareness: Secondary | ICD-10-CM | POA: Diagnosis not present

## 2019-01-05 DIAGNOSIS — E569 Vitamin deficiency, unspecified: Secondary | ICD-10-CM

## 2019-01-05 DIAGNOSIS — F1090 Alcohol use, unspecified, uncomplicated: Secondary | ICD-10-CM

## 2019-01-05 DIAGNOSIS — R270 Ataxia, unspecified: Secondary | ICD-10-CM | POA: Diagnosis not present

## 2019-01-05 DIAGNOSIS — F0390 Unspecified dementia without behavioral disturbance: Secondary | ICD-10-CM | POA: Insufficient documentation

## 2019-01-05 NOTE — Patient Instructions (Signed)
Alcohol Use Disorder Alcohol use disorder is when your drinking disrupts your daily life. When you have this condition, you drink too much alcohol and you cannot control your drinking. Alcohol use disorder can cause serious problems with your physical health. It can affect your brain, heart, liver, pancreas, immune system, stomach, and intestines. Alcohol use disorder can increase your risk for certain cancers and cause problems with your mental health, such as depression, anxiety, psychosis, delirium, and dementia. People with this disorder risk hurting themselves and others. What are the causes? This condition is caused by drinking too much alcohol over time. It is not caused by drinking too much alcohol only one or two times. Some people with this condition drink alcohol to cope with or escape from negative life events. Others drink to relieve pain or symptoms of mental illness. What increases the risk? You are more likely to develop this condition if:  You have a family history of alcohol use disorder.  Your culture encourages drinking to the point of intoxication, or makes alcohol easy to get.  You had a mood or conduct disorder in childhood.  You have been a victim of abuse.  You are an adolescent and: ? You have poor grades or difficulties in school. ? Your caregivers do not talk to you about saying no to alcohol, or supervise your activities. ? You are impulsive or you have trouble with self-control. What are the signs or symptoms? Symptoms of this condition include:  Drinkingmore than you want to.  Drinking for longer than you want to.  Trying several times to drink less or to control your drinking.  Spending a lot of time getting alcohol, drinking, or recovering from drinking.  Craving alcohol.  Having problems at work, at school, or at home due to drinking.  Having problems in relationships due to drinking.  Drinking when it is dangerous to drink, such as before  driving a car.  Continuing to drink even though you know you might have a physical or mental problem related to drinking.  Needing more and more alcohol to get the same effect you want from the alcohol (building up tolerance).  Having symptoms of withdrawal when you stop drinking. Symptoms of withdrawal include: ? Fatigue. ? Nightmares. ? Trouble sleeping. ? Depression. ? Anxiety. ? Fever. ? Seizures. ? Severe confusion. ? Feeling or seeing things that are not there (hallucinations). ? Tremors. ? Rapid heart rate. ? Rapid breathing. ? High blood pressure.  Drinking to avoid symptoms of withdrawal. How is this diagnosed? This condition is diagnosed with an assessment. Your health care provider may start the assessment by asking three or four questions about your drinking. Your health care provider may perform a physical exam or do lab tests to see if you have physical problems resulting from alcohol use. She or he may refer you to a mental health professional for evaluation. How is this treated? Some people with alcohol use disorder are able to reduce their alcohol use to low-risk levels. Others need to completely quit drinking alcohol. When necessary, mental health professionals with specialized training in substance use treatment can help. Your health care provider can help you decide how severe your alcohol use disorder is and what type of treatment you need. The following forms of treatment are available:  Detoxification. Detoxification involves quitting drinking and using prescription medicines within the first week to help lessen withdrawal symptoms. This treatment is important for people who have had withdrawal symptoms before and for heavy drinkers   of treatment you need. The following forms of treatment are available:  · Detoxification. Detoxification involves quitting drinking and using prescription medicines within the first week to help lessen withdrawal symptoms. This treatment is important for people who have had withdrawal symptoms before and for heavy drinkers who are likely to have withdrawal symptoms. Alcohol withdrawal can be dangerous, and in severe cases, it can cause death. Detoxification may be provided in a home, community, or primary care setting, or in a hospital or substance use  treatment facility.  · Counseling. This treatment is also called talk therapy. It is provided by substance use treatment counselors. A counselor can address the reasons you use alcohol and suggest ways to keep you from drinking again or to prevent problem drinking. The goals of talk therapy are to:  ? Find healthy activities and ways for you to cope with stress.  ? Identify and avoid the things that trigger your alcohol use.  ? Help you learn how to handle cravings.  · Medicines. Medicines can help treat alcohol use disorder by:  ? Decreasing alcohol cravings.  ? Decreasing the positive feeling you have when you drink alcohol.  ? Causing an uncomfortable physical reaction when you drink alcohol (aversion therapy).  · Support groups. Support groups are led by people who have quit drinking. They provide emotional support, advice, and guidance.  These forms of treatment are often combined. Some people with this condition benefit from a combination of treatments provided by specialized substance use treatment centers.  Follow these instructions at home:  · Take over-the-counter and prescription medicines only as told by your health care provider.  · Check with your health care provider before starting any new medicines.  · Ask friends and family members not to offer you alcohol.  · Avoid situations where alcohol is served, including gatherings where others are drinking alcohol.  · Create a plan for what to do when you are tempted to use alcohol.  · Find hobbies or activities that you enjoy that do not include alcohol.  · Keep all follow-up visits as told by your health care provider. This is important.  How is this prevented?  · If you drink, limit alcohol intake to no more than 1 drink a day for nonpregnant women and 2 drinks a day for men. One drink equals 12 oz of beer, 5 oz of wine, or 1½ oz of hard liquor.  · If you have a mental health condition, get treatment and support.  · Do not give alcohol to  adolescents.  · If you are an adolescent:  ? Do not drink alcohol.  ? Do not be afraid to say no if someone offers you alcohol. Speak up about why you do not want to drink. You can be a positive role model for your friends and set a good example for those around you by not drinking alcohol.  ? If your friends drink, spend time with others who do not drink alcohol. Make new friends who do not use alcohol.  ? Find healthy ways to manage stress and emotions, such as meditation or deep breathing, exercise, spending time in nature, listening to music, or talking with a trusted friend or family member.  Contact a health care provider if:  · You are not able to take your medicines as told.  · Your symptoms get worse.  · You return to drinking alcohol (relapse) and your symptoms get worse.  Get help right away if:  · You have thoughts   life. When you have this condition, you drink too much alcohol and you cannot control your drinking.  Treatment may include detoxification, counseling, medicine, and support groups.  Ask friends and family members not to offer you alcohol. Avoid situations where alcohol is served.  Get help right away if you have thoughts about hurting yourself or others. This information is not intended to replace advice given to you by your health care provider. Make sure you discuss any questions you have with your health care provider. Document Released: 12/12/2004 Document Revised: 08/01/2016 Document Reviewed: 08/01/2016 Elsevier  Interactive Patient Education  2019 [employer].    Alcohol Abuse and Nutrition Alcohol abuse is any pattern of alcohol consumption that harms your health, relationships, or work. Alcohol abuse can cause poor nutrition (malnutrition or malnourishment) and a lack of nutrients (nutrient deficiencies), which can lead to more complications. Alcohol abuse brings malnutrition and nutrient deficiencies in two ways:  It causes your liver to work abnormally. This affects how your body divides (breaks down) and absorbs nutrients from food.  It causes you to eat poorly. Many people who abuse alcohol do not eat enough carbohydrates, protein, fat, vitamins, and minerals. Nutrients that are commonly lacking (deficient) in people who abuse alcohol include:  Vitamins. ? Vitamin A. This is needed for your vision, metabolism, and ability to fight off infections (immunity). ? B vitamins. These include folate, thiamine, and niacin. These are needed for new cell growth. ? Vitamin C. This plays an important role in wound healing, immunity, and helping your body to absorb iron. ? Vitamin D. This is necessary for your body to absorb and use calcium. It is produced by your liver, but you can also get it from food and from sun exposure.  Minerals. ? Calcium. This is needed for healthy bones as well as heart and blood vessel (cardiovascular) function. ? Iron. This is important for blood, muscle, and nervous system functioning. ? Magnesium. This plays an important role in muscle and nerve function, and it helps to control blood sugar and blood pressure. ? Zinc. This is important for the normal functioning of your nervous system and digestive system (gastrointestinal tract). If you think that you have an alcohol dependency problem, or if it is hard to stop drinking because you feel sick or different when you do not use alcohol, talk with your health care provider or another health professional about where to get  help. Nutrition is an essential factor in therapy for alcohol abuse. Your health care provider or diet and nutrition specialist (dietitian) will work with you to design a plan that can help to restore nutrients to your body and prevent the risk of complications. What is my plan? Your dietitian may develop a specific eating plan that is based on your condition and any other problems that you have. An eating plan will commonly include:  A balanced diet. ? Grains: 6-8 oz (170-227 g) a day. Examples of 1 oz of whole grains include 1 cup of whole-wheat cereal,  cup of brown rice, or 1 slice of whole-wheat bread. ? Vegetables: 2-3 cups a day. Examples of 1 cup of vegetables include 2 medium carrots, 1 large tomato, or 2 stalks of celery. ? Fruits: 1-2 cups a day. Examples of 1 cup of fruit include 1 large banana, 1 small apple, 8 large strawberries, or 1 large orange. ? Meat and other protein: 5-6 oz (142-170 g) a day.  A cut of meat or fish that is the size of  a deck of cards is about 3-4 oz.  Foods that provide 1 oz of protein include 1 egg,  cup of nuts or seeds, or 1 tablespoon (16 g) of peanut butter. ? Dairy: 2-3 cups a day. Examples of 1 cup of dairy include 8 oz (230 mL) of milk, 8 oz (230 g) of yogurt, or 1 oz (44 g) of natural cheese.  Vitamin and mineral supplements. What are tips for following this plan?  Eat frequent meals and snacks. Try to eat 5-6 small meals each day.  Take vitamin or mineral supplements as recommended by your dietitian.  If you are malnourished or if your dietitian recommends it: ? You may follow a high-protein, high-calorie diet. This may include:  2,000-3,000 calories (kilocalories) a day.  70-100 g (grams) of protein a day. ? You may be directed to follow a diet that includes a complete nutritional supplement beverage. This can help to restore calories, protein, and vitamins to your body. Depending on your condition, you may be advised to consume this  beverage instead of your meals or in addition to them.  Certain medicines may cause changes in your appetite, taste, and weight. Work with your health care provider and dietitian to make any changes to your medicines and eating plan.  If you are unable to take in enough food and calories by mouth, your health care provider may recommend a feeding tube. This tube delivers nutritional supplements directly to your stomach. Recommended foods  Eat foods that are high in molecules that prevent oxygen from reacting with your food (antioxidants). These foods include grapes, berries, nuts, green tea, and dark green or orange vegetables. Eating these can help to prevent some of the stress that is placed on your liver by consuming alcohol.  Eat a variety of fresh fruits and vegetables each day. This will help you to get fiber and vitamins in your diet.  Drink plenty of water and other clear fluids, such as apple juice and broth. Try to drink at least 48-64 oz (1.5-2 L) of water a day.  Include foods fortified with vitamins and minerals in your diet. Commonly fortified foods include milk, orange juice, cereal, and bread.  Eat a variety of foods that are high in omega-3 and omega-6 fatty acids. These include fish, nuts and seeds, and soybeans. These foods may help your liver to recover and may also stabilize your mood.  If you are a vegetarian: ? Eat a variety of protein-rich foods. ? Pair whole grains with plant-based proteins at meals and snack time. For example, eat rice with beans, put peanut butter on whole-grain toast, or eat oatmeal with sunflower seeds. The items listed above may not be a complete list of foods and beverages you can eat. Contact a dietitian for more information. Foods to avoid  Avoid foods and drinks that are high in fat and sugar. Sugary drinks, salty snacks, and candy contain empty calories. This means that they lack important nutrients such as protein, fiber, and  vitamins.  Avoid alcohol. This is the best way to avoid malnutrition due to alcohol abuse. If you must drink, drink measured amounts. Measured drinking means limiting your intake to no more than 1 drink a day for nonpregnant women and 2 drinks a day for men. One drink equals 12 oz (355 mL) of beer, 5 oz (148 mL) of wine, or 1 oz (44 mL) of hard liquor.  Limit your intake of caffeine. Replace drinks like coffee and black tea with decaffeinated  coffee and decaffeinated herbal tea. The items listed above may not be a complete list of foods and beverages you should avoid. Contact a dietitian for more information. Summary  Alcohol abuse can cause poor nutrition (malnutrition or malnourishment) and a lack of nutrients (nutrient deficiencies), which can lead to more health problems.  Common nutrient deficiencies include vitamin deficiencies (A, B, C, and D) and mineral deficiencies (calcium, iron, magnesium, and zinc).  Nutrition is an essential factor in therapy for alcohol abuse.  Your health care provider and dietitian can help you to develop a specific eating plan that includes a balanced diet plus vitamin and mineral supplements. This information is not intended to replace advice given to you by your health care provider. Make sure you discuss any questions you have with your health care provider. Document Released: 08/29/2005 Document Revised: 07/08/2018 Document Reviewed: 07/22/2017 Elsevier Interactive Patient Education  2019 [employer].

## 2019-01-05 NOTE — Progress Notes (Signed)
[facility]    Provider:  Dr Jaynee Eagles Referring Provider: Reymundo Poll MD Primary Care Physician:  Reymundo Poll MD  CC: Dizziness  Interval history January 05, 2019: Patient is here for follow-up.  I saw him back in 2017.  In the meantime he is also seen other neurologist including Dr. Narda Amber at [facility] who is an excellent physician.   He is here and doesn't know why he is here. He says he doesn't use alcohol and he has never had a problem with alcohol. He presented to the emergency room 12/01/2018 after a fall with elevated ethanol levels in the blood. Gait difficulty thought to be due to alcoholism and thiamine deficiency. MRi of the brain did not show anything aute. Started on high-dose Thiamine, tsh and b-12 were normal. Long history of alcohol abuse. He is being restricted from driving and he is upset, that he is a danger on the road.   He declines another driving test. He declines repeat memory testing which was recommended. He is quiyte angry he cannot drive, he has poor insight into the dangers he poses.    Review of Dr. Serita Grit notes:  I reviewed Dr. Serita Grit notes, he endorsed short-term memory loss gradual since 2014 he does not agree with his diagnosis of dementia which was given by Dr. Lavell Anchors and his PCP.  He misplaces paperwork and reports that his filing system is not as great as it used to be.  He denies any difficulty remembering his medications however previous office visits and other providers document that he is frequently missing his medications.  Review of epic shows that he has been hospitalized previously for alcohol intoxication and he has a long history of alcohol abuse.  He was very upset that his driver's license was canceled by the [facility].  He was also diagnosed with Parkinson's disease in the summer 2016 and started on Sinemet which did not help.  He uses a cane for long distances.  A mild resting tremor improved with Sinemet.  Also medication  noncompliance.  Neuropsychological testing showed mild cognitive impairment with possible early signs of Alzheimer's disease and recommended retesting in 1 year.  His providers including myself his PCP Dr. Posey Pronto have advised him against driving.  He declined a formal driving assessment.  But in 2018 he had driving evaluation and had his license again.  He manages his own finances and bills.  Dr. Posey Pronto diagnosed him with mild cognitive impairment possibly early signs of Alzheimer's disease per neuropsychiatric evaluation.  He completed his driving evaluation with neuropsych testing and had his driver's license reinstated in the summer 2018.  Peripheral neuropathy due to history of alcohol use clinically stable.  He was return to clinic as needed.  Interval history: Patient was admitted to [facility] hospital 11/20/2015 after a fall, diagnosed with lactic acidosis, UTI, alcohol intoxication, AMS. He has a PMHx of HTN, HLD, Alcohol abuse with intoxication, resting tremor improved with Sinemet, acute respiratory failure with hypercapnia, malnutrition, COPD. He is here because no one will let him drive. He is here to appeal to me. He says he has stopped drinking.  He says he is aware that he has memory loss. He wants me to write a letter saying he can drive in my opinion. I cannot do that for him.   His license has been medically revoked and he is here today to appeal to me to help him get his license back. I had a long discussion with patient and I  apologized that I can't help him. I do understand that the ability to drive a car is very important to independence. Unfortunately I do feel that patient should not be driving due to his cognitive impairment, noncompliance with medical management, syncopal episodes or falls, alcoholic intoxication and encephalopathy. I feel that operating a vehicle will be dangerous to patient and others on the road.   02/2015: Victor Clarke is a 85 y.o. male here as a follow up  No more  syncopal episodes. No dizziness anymore. He is not remembering to take his medications. No taking the Sinemet, not interested in taking it. Tremor isn' t bothering him. Doesn't affect him. He takes his time when he gets out of bed. He is not interested in coming back unless he needs to.   Interval History 11/15/2014: Victor Clarke is a 85 y.o. male here as a follow up for Dizziness  The dizzyness has improved. Has not had any falls. He has been following with cardiologist. Sometimes he feels a little mild lightheaded when getting up, if he sits for 30 seconds it gets better. The Sinemet never helped. He is very active, he seems to be getting around fine. He has not had any presyncopal episodes or syncopal episodes since last being seen. He is trying to take his medication daily. He is missing some doses but tries to keep up as best he can. He got pill sorter to help. When asked about his memory, he says "I am slowing down, I am 86". Denies any significant memory changes. He writes down his appointments, not getting lost when he goes out, not forgeting to pay bills, denies having some incidents of things like turning off the stove in the house or doing anything that would put him in danger. Last night in fact he double checked the burners. He doesn't think he needs more medication, doesn't want anything for memory. He feels more confident about his situation. Has some tremors but they are manageable. Not interfering with daily life except maybe his handwriting a little. He is aware of SOB when he is walking but walking is fine. He doesn't know if he had the MRI of his brain complete.   At last appointment he was referred to cardiology because dizzyness was suspected to be heart related. He was diagnosed with Ectopic atrial tachycardia and started on AV nodal blockade, diltiazem 120 mg CD once a day. He was subsequently seen by Dr. Rayann Heman to discuss EP study and radioablation.   Initial Appointment  09/29/2014: HPI: Victor Clarke is a 85 y.o. male here as a referral from Dr. Radene Ou for balance and gait disorder  Patient continues to have episodes of dizziness. Worsening. He fell yesterday at home because he felt dizzy. He was originally seen here in the office and displayed parkinsonian symptoms on clinical exam however sinemet did not help and he has stopped taking the Sinemet.And his chief complaint was dizziness, he is not concerned with gait or balance. The dizziness is worsening. He was standing at the counter at the kitchen yesterday and a dizzy spell came over him and he flopped down on the floor. Episode lasted 3 minutes. He was clutching the counter just trying to stand. No loss of consciousness. The room was not spinning. Can't describe what he feels is dizzy. "I was just struggling to get up". He was conscious. Fell on his knees. He was SOB during the episode. He sat in a chair and then all of a  sudden he felt better.   This morning he was dizzy but not enough to fall. He was at the counter at the kitchen standing making coffee. He lives alone in his home. Dizziness lasted less than a minute then resolved. No chest pain. He feels his heart racing occasionally. He is having SOB with the dizziness.  He says he is missing medication. He forgets. He misses his schedule of some of the drugs. Sometimes "I just don't think to take it". He has no family to help him. He doesn't want to live in an assisted living facility. Family is not near by, his brother is in [place] and niece lives in [place]. He was going to buy a pill organizer today as suggested by his niece. He has not taken his medication yet today. "I just didn't think about it" referring to taking his medication.  Reviewed notes, labs and imaging from outside physicians, which showed; Patient was admitted on 10/30 for SOB and dizziness. While being evaluated in emergency department noted short run of apparent A. fib with RVR  which self resolved. [facility] 11.1.2015: There was moderate concentric hypertrophy. Systolic function was normal. The estimated ejection fraction was in the range of 55% to 60%. Wall motion was normal; there were no regional wall motion abnormalities. Left ventricular diastolic function parameters were normal. Aortic valve: Valve area (Vmax): 2.42 cm^2. - Mitral valve: Calcified annulus. - Left atrium: The atrium was mildly dilated. Ct angi chest no PE. No events on telemetry, 2 12 leads EKG no change.  Review of Systems: Patient complains of symptoms per HPI as well as the following symptoms: SOB, dizziness. Denies CP. Pertinent negatives per HPI. All others negative.     Social History   Socioeconomic History  . Marital status: Widowed    Spouse name: Not on file  . Number of children: 0  . Years of education: College  . Highest education level: Not on file  Occupational History  . Occupation: Retired    Fish farm manager: OTHER  Social Needs  . Financial resource strain: Not on file  . Food insecurity:    Worry: Not on file    Inability: Not on file  . Transportation needs:    Medical: Not on file    Non-medical: Not on file  Tobacco Use  . Smoking status: Former Smoker    Packs/day: 2.00    Years: 22.00    Pack years: 44.00    Types: Cigarettes    Last attempt to quit: 11/18/1977    Years since quitting: 41.1  . Smokeless tobacco: Never Used  Substance and Sexual Activity  . Alcohol use: No    Alcohol/week: 2.0 standard drinks    Types: 1 Shots of liquor, 1 Standard drinks or equivalent per week    Comment: update 01/05/2019 none in past month   . Drug use: No  . Sexual activity: Never  Lifestyle  . Physical activity:    Days per week: Not on file    Minutes per session: Not on file  . Stress: Not on file  Relationships  . Social connections:    Talks on phone: Not on file    Gets together: Not on file    Attends religious service: Not on file    Active member of  club or organization: Not on file    Attends meetings of clubs or organizations: Not on file    Relationship status: Not on file  . Intimate partner violence:  Fear of current or ex partner: Not on file    Emotionally abused: Not on file    Physically abused: Not on file    Forced sexual activity: Not on file  Other Topics Concern  . Not on file  Social History Narrative   Patient lives at an assisted living facility, [facility].  Has no children.     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     Education: college degree.   Caffeine Use: 1 cup occasionally    Family History  Problem Relation Age of Onset  . Stroke Father        died @ 104.  Marland Kitchen Heart attack Mother        died @ 24  . Stroke Brother        died in his 12's  . Heart attack Brother        died in his 15's  . Colon cancer Neg Hx   . Esophageal cancer Neg Hx   . Rectal cancer Neg Hx   . Stomach cancer Neg Hx   . Prostate cancer Neg Hx     Past Medical History:  Diagnosis Date  . Alcoholic peripheral neuropathy ([place])   . Allergy   . Anemia   . Arthritis   . Cataract    left eye cataract removed  . COPD (chronic obstructive pulmonary disease) ([place])    pt denies 02-03-17  . Dehydration   . Detached retina    a. s/p surgical correction on the right.  . Ectopic atrial tachycardia ([place])    a. 08/2014 Echo: EF 55-60%, mildly dil LA.  Marland Kitchen EtOH dependence ([place])   . Fall   . Falls frequently 11/2018  . Gout   . Gout   . Hypercholesteremia   . Hypertension   . Left inguinal hernia    a. s/p repair ~ 10 yrs ago.  . Malnutrition ([place])   . Orthostatic hypotension   . Parkinson's disease ([place])   . Prostate cancer ([place])    a. s/p TURP.    Past Surgical History:  Procedure Laterality Date  . CATARACT EXTRACTION     on eye, possibly right eye  . COLONOSCOPY    . COLONOSCOPY W/ POLYPECTOMY  2001, 2006  . HERNIA REPAIR    . PROSTATE SURGERY      Current Outpatient Medications    Medication Sig Dispense Refill  . amLODipine-benazepril (LOTREL) 5-20 MG capsule Take 1 capsule by mouth daily.    Marland Kitchen aspirin EC 81 MG tablet Take 81 mg by mouth every Monday, Wednesday, and Friday.     . folic acid (FOLVITE) 1 MG tablet Take 1 tablet (1 mg total) by mouth daily. 30 tablet 0  . LORazepam (ATIVAN) 0.5 MG tablet Take 0.5 tablets by mouth daily as needed for agitation or anxiety.    . Magnesium Oxide 250 MG TABS Take 500 mg by mouth daily.    . Multiple Vitamin (MULTIVITAMIN) tablet Take 1 tablet by mouth daily.    Marland Kitchen nystatin cream (MYCOSTATIN) Apply topically 2 (two) times daily. Apply on bilateral groins 30 g 0  . rosuvastatin (CRESTOR) 20 MG tablet Take 20 mg by mouth daily.   1  . thiamine (VITAMIN B-1) 100 MG tablet Take 1 tablet (100 mg total) by mouth daily. 30 tablet 0  . VITAMIN D PO Take 1,000 Units by mouth daily.    . Cholecalciferol (VITAMIN D3) 5000 units CAPS Take 10,000 Units  by mouth daily.    . Dermatological Products, Misc. (LEVICYN) GEL Apply 1 application topically daily.    . feeding supplement, ENSURE ENLIVE, (ENSURE ENLIVE) LIQD Take 237 mLs by mouth 3 (three) times daily between meals. (Patient not taking: Reported on 01/05/2019) 237 mL 12   No current facility-administered medications for this visit.     Allergies as of 01/05/2019  . (No Known Allergies)    Vitals: BP 108/63 (BP Location: Right Arm, Patient Position: Sitting)   Pulse 74   Ht 5' 9.5" (1.765 m)   Wt 145 lb (65.8 kg)   BMI 21.11 kg/m  Last Weight:  Wt Readings from Last 1 Encounters:  01/05/19 145 lb (65.8 kg)   Last Height:   Ht Readings from Last 1 Encounters:  01/05/19 5' 9.5" (1.765 m)    Cranial Nerves:  The pupils are equal, round, and reactive to light. Visual fields are full to finger confrontation. Extraocular movements are intact. Trigeminal sensation is intact and the muscles of mastication are normal. The face is symmetric. The palate elevates in the midline.  Voice is normal. Shoulder shrug is normal. The tongue has normal motion without fasciculations.   Coordination:  Normal finger to nose  Gait:  wide-based, cannot heel-toe walk, imbalance  Motor Observation: postural tremor  Posture:  Posture is slightly stooped.    Strength:  Strength is equal and antigravity no deficits notes   Sensation: intact to LT   Reflex Exam:  DTR's:  Brisk throughout  Assessment/Plan: 85 year old male who is referred from his physician forataxia, falls in the setting of alcohol abuse. He has a history of ongoing alcoholism and falls, alcoholic neuropathy and imbalance. He was seen here in 2017 and also saw Dr. Posey Pronto who is an excellent neurologist more recently.  -. He is a poor historian and has cognitive deficits and is noncompliant.He has an ongoing history of alcohol abuse. He has very poor judgement into his alcohol abuse, his memory loss and the dangers he poses on the road.   - Ataxia due to alcoholism and alcoholic neuropathy  - His license has been medically revoked and he is here today to appeal to me to help him get his license back. I had a long discussion with patient and I apologized that I can't help him. I do understand that the ability to drive a car is very important to independence. Unfortunately I do feel that patient should not be driving due to his cognitive impairment and alcohol abuse.   - noncompliance with medical management, falls, ongoing alcoholic intoxication and encephalopathy. I feel that operating a vehicle will be dangerous to patient and others on the road. He denies all of the above, again poor insight and judgement.  - MCI possibly Azheimers on prior formal neurocognitive testing. He declines a repeat or memory testing today.   - His license has been revoked and I agree, I will not intervene on his behalf as he should not be driving, agree with other physician.  - He requests a driving  evaluation, he is going to go forward with that.   - Thiamine deficiency likely due to alcoholism  Nothing further from neurology, poor historian, poor judgement, alcoholism, fall risk, non compliance, not willing to have any further workup or help.  A total of 40 minutes was spent face-to-face with this patient. Over half this time was spent on counseling patient on the    1. Problematic consumption of alcohol   2.  Ataxia   3. Cognitive impairment   4. Alcoholic peripheral neuropathy ([place])   5. Vitamin deficiency      diagnosis and different diagnostic and therapeutic options, counseling and coordination of care, risks ans benefits of management, compliance, or risk factor reduction and education.    Cc: Dr. Virl Son, MD  [facility] [address] [place], [place] [address]  Phone [phone] Fax [phone]  A total of 30 minutes was spent face-to-face with this patient. Over half this time was spent on counseling patient on the parkinsonism, cognitive impairment diagnosis and different diagnostic and therapeutic options available.

## 2019-01-24 IMAGING — CT CT HEAD W/O CM
3 series · 15 of 47 positions shown, 18 images · non-contrast
Comparison: 06/18/2018 and 11/10/2015

CLINICAL DATA: Dizziness for several days. Unknown loss of
consciousness or fall.

EXAM:
CT HEAD WITHOUT CONTRAST
TECHNIQUE: Contiguous axial images were obtained from the base of the skull
through the vertex without intravenous contrast.

[Series 2: head wo · axial · 0.47mm/px · z∈[-123,+2]mm · 9 of 30 slices shown, 12 images]
[im 3/30  brain]
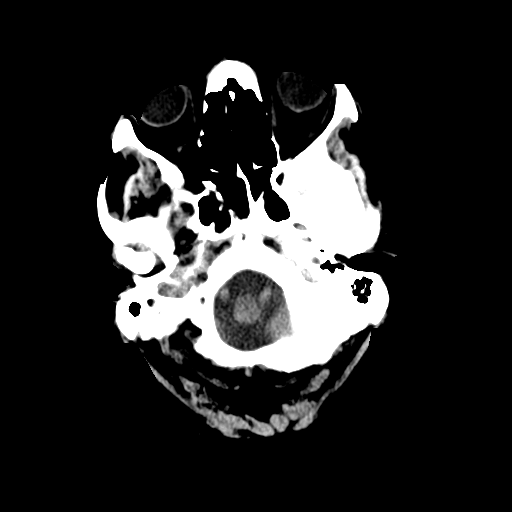
[im 3/30  bone]
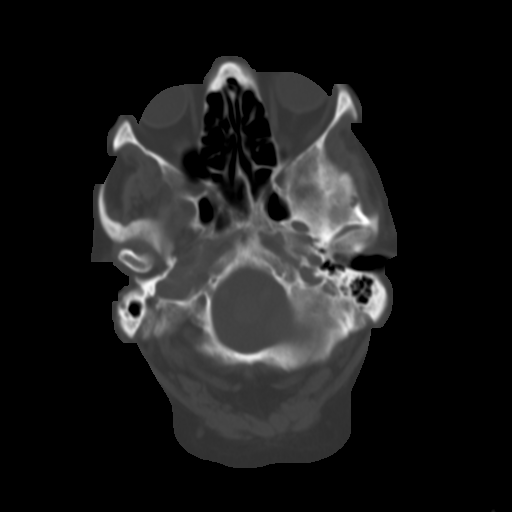
[im 6/30  brain]
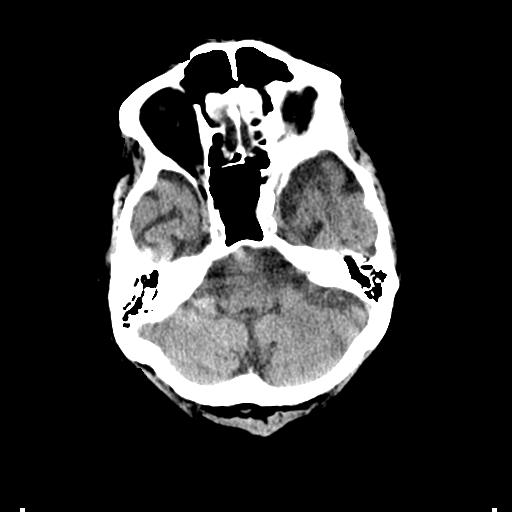
[im 9/30  brain]
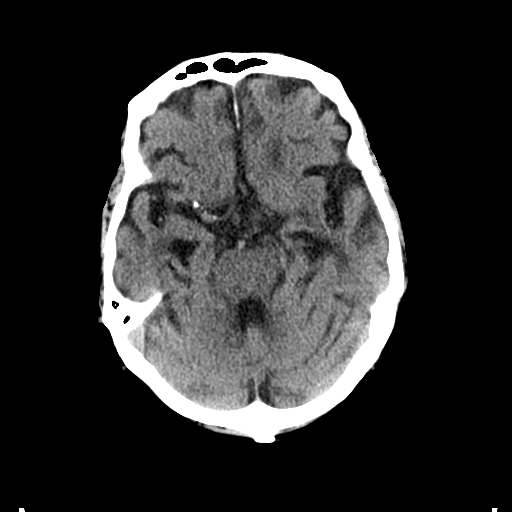
[im 12/30  brain]
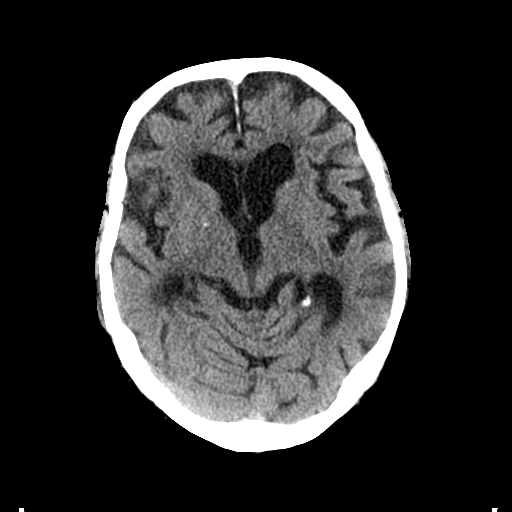
[im 16/30  brain]
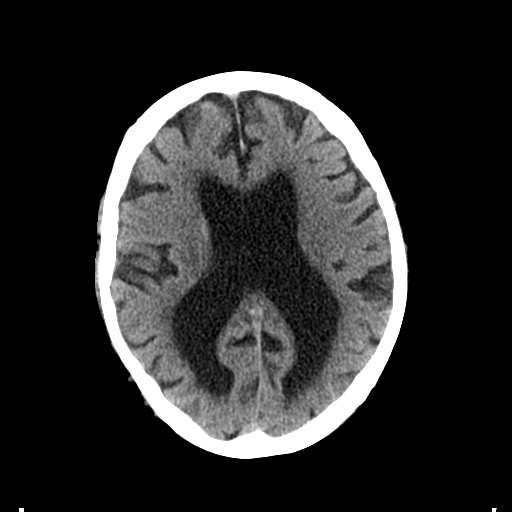
[im 16/30  bone]
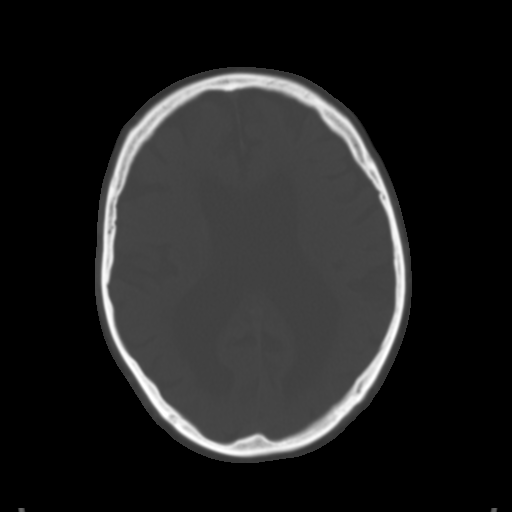
[im 19/30  brain]
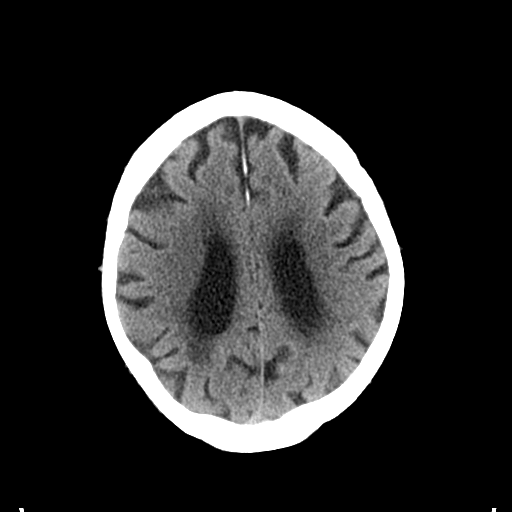
[im 22/30  brain]
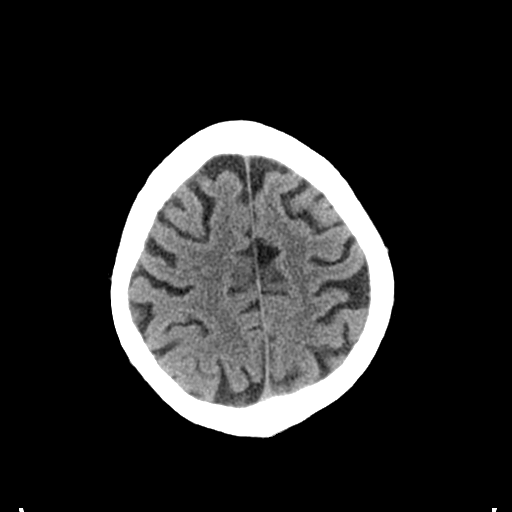
[im 25/30  brain]
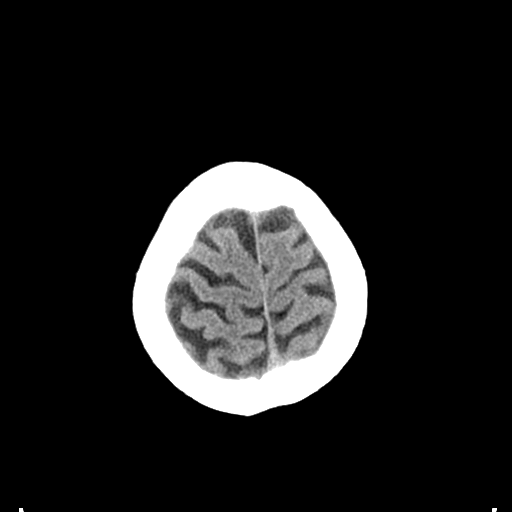
[im 28/30  brain]
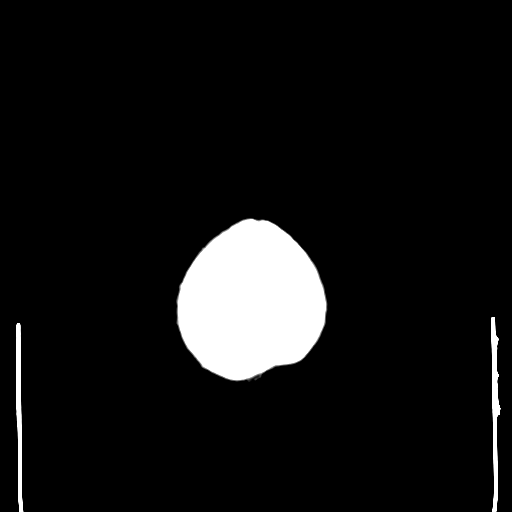
[im 28/30  bone]
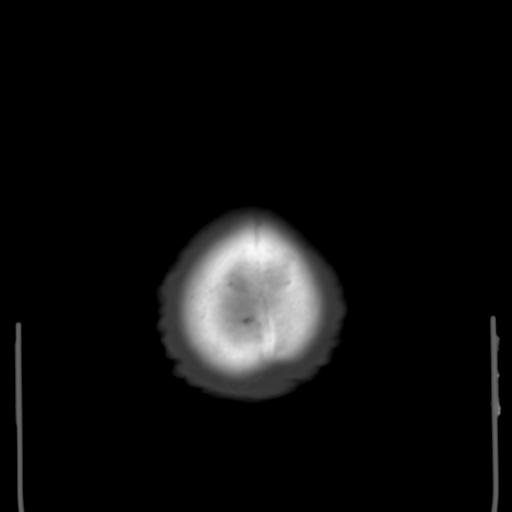

[Series 5: coronal soft tissue · coronal · 0.30mm/px · 3 of 68 slices shown]
[im 23/68  brain]
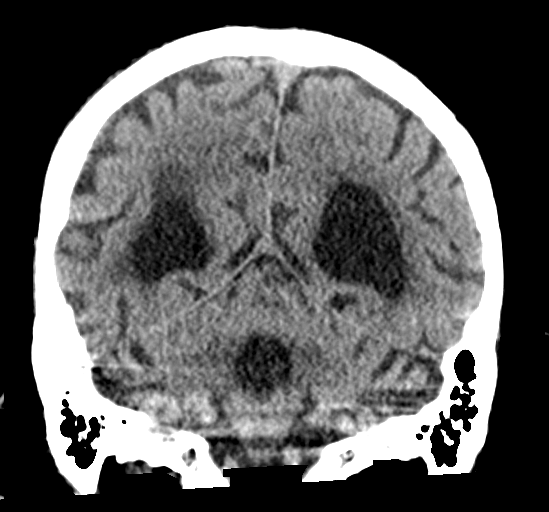
[im 30/68  brain]
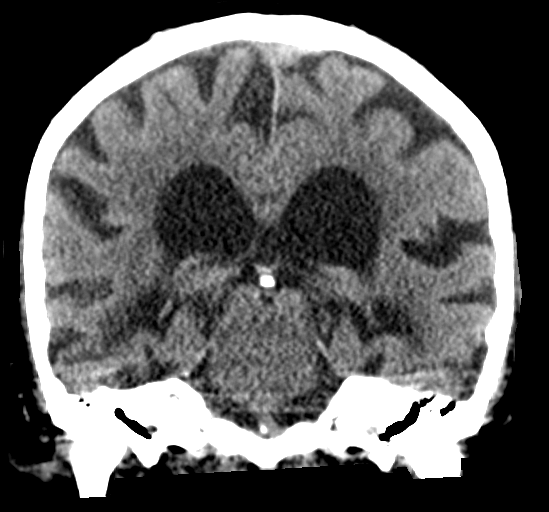
[im 38/68  brain]
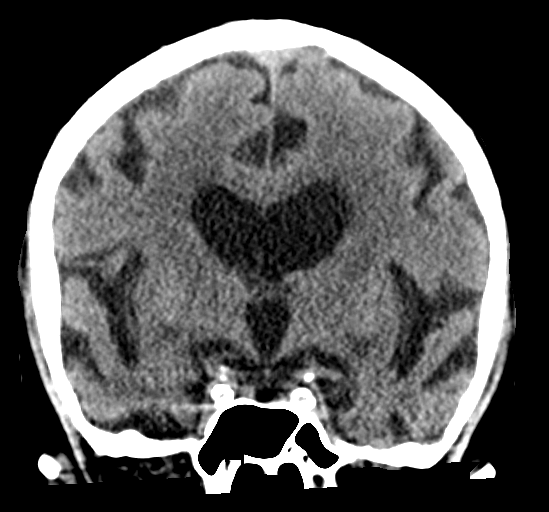

[Series 6: sagittal soft tissue · sagittal · 0.29mm/px · 3 of 55 slices shown]
[im 19/55  brain]
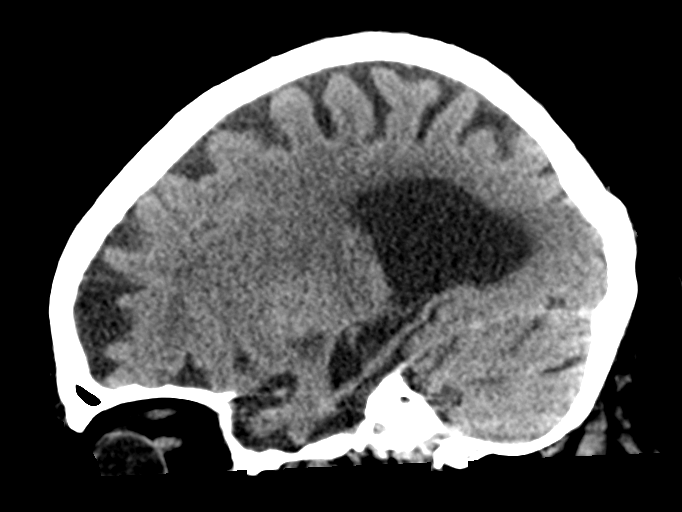
[im 28/55  brain]
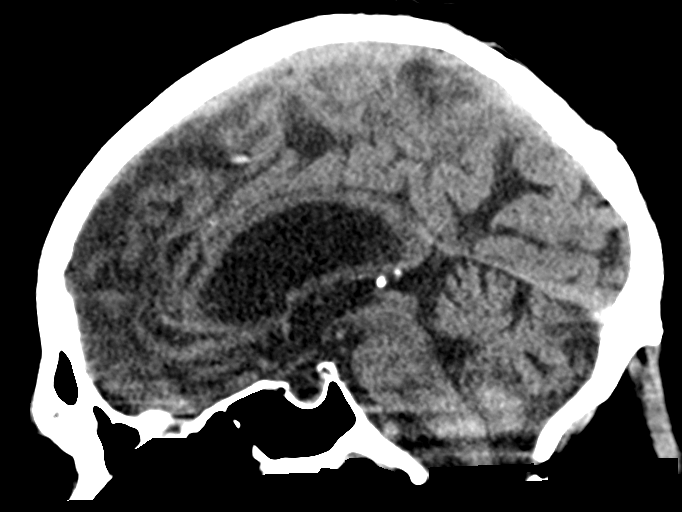
[im 37/55  brain]
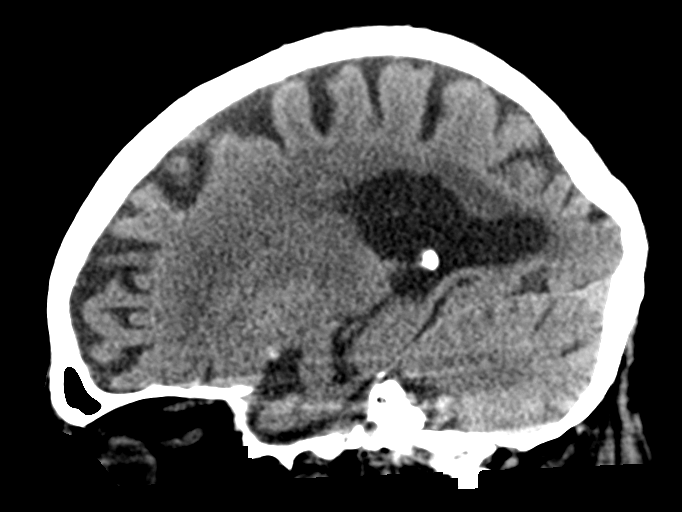

[15 of 47 positions shown; findings below may reference images not displayed]

FINDINGS: BRAIN: There is sulcal and ventricular prominence consistent with
superficial and central atrophy. No intraparenchymal hemorrhage,
mass effect nor midline shift. Periventricular and subcortical white
matter hypodensities consistent with chronic small vessel ischemic
disease are identified. No acute large vascular territory infarcts.
No abnormal extra-axial fluid collections. Basal cisterns are not
effaced and midline.

VASCULAR: Moderate calcific atherosclerosis of the carotid siphons.

SKULL: No skull fracture. No significant scalp soft tissue swelling.

SINUSES/ORBITS: The mastoid air-cells are clear. The included
paranasal sinuses are well-aerated.Probable glaucoma reservoir on
the right and cataract resection on the left.

OTHER: None.
IMPRESSION: Atrophy with chronic microvascular ischemia, stable in appearance
without acute intracranial abnormality.

## 2019-02-01 IMAGING — DX DG CHEST 1V PORT
1 series · 1 of 1 positions shown · non-contrast
Comparison: Radiographs June 18, 2018.

CLINICAL DATA: Shortness of breath.

EXAM:
PORTABLE CHEST 1 VIEW

[chest ap]
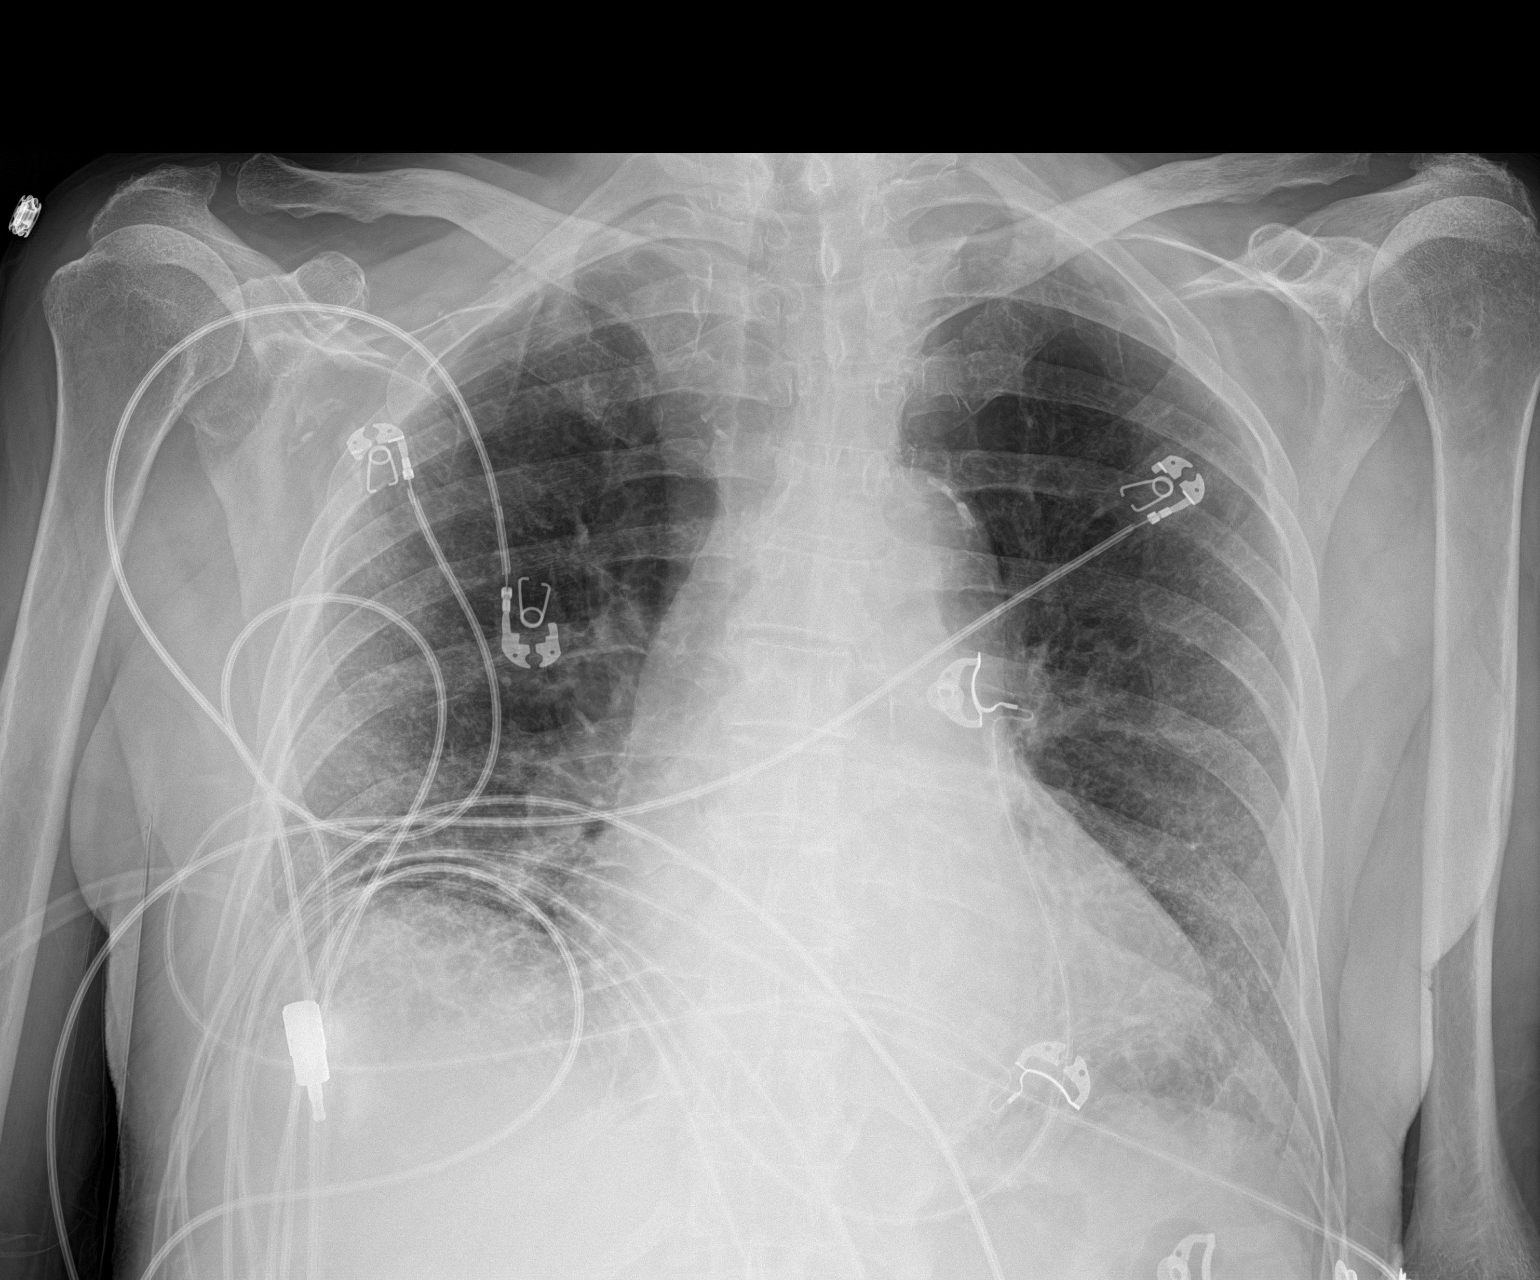

[1 of 1 positions shown; findings below may reference images not displayed]

FINDINGS: Stable cardiomediastinal silhouette. Atherosclerosis of thoracic
aorta is noted. No pneumothorax or pleural effusion is noted. Bony
thorax is unremarkable. Stable bibasilar scarring is noted. No acute
abnormality is noted.
IMPRESSION: No acute cardiopulmonary abnormality seen.

Aortic Atherosclerosis ([id number]).

## 2019-07-28 ENCOUNTER — Telehealth: Payer: Self-pay | Admitting: *Deleted

## 2019-07-28 NOTE — Telephone Encounter (Signed)
Called pt & LVM advising MD out of office on Tues 9/15 and appointment will need to be rescheduled. I left the office number in message and asked for a call back to reschedule appointment.

## 2019-07-29 ENCOUNTER — Encounter: Payer: Self-pay | Admitting: *Deleted

## 2019-08-03 ENCOUNTER — Ambulatory Visit: Payer: Medicare Other | Admitting: Neurology

## 2019-08-04 ENCOUNTER — Ambulatory Visit: Payer: Medicare Other | Admitting: Family Medicine

## 2019-08-05 ENCOUNTER — Other Ambulatory Visit: Payer: Self-pay

## 2019-08-05 ENCOUNTER — Ambulatory Visit (INDEPENDENT_AMBULATORY_CARE_PROVIDER_SITE_OTHER): Payer: Medicare Other | Admitting: Adult Health

## 2019-08-05 ENCOUNTER — Encounter: Payer: Self-pay | Admitting: Adult Health

## 2019-08-05 VITALS — BP 112/76 | HR 71 | Temp 98.4°F | Ht 69.5 in | Wt 159.6 lb

## 2019-08-05 DIAGNOSIS — R4189 Other symptoms and signs involving cognitive functions and awareness: Secondary | ICD-10-CM | POA: Diagnosis not present

## 2019-08-05 NOTE — Patient Instructions (Signed)
Your Plan:  Continue to monitor memory  Score today 23/30. We will recheck memory at the next visit. If there is a decline we will consider medication.  If your symptoms worsen or you develop new symptoms please let us know.   Thank you for coming to see [place] at [facility]. I hope we have been able to provide you high quality care today.  You may receive a patient satisfaction survey over the next few weeks. We would appreciate your feedback and comments so that we may continue to improve ourselves and the health of our patients.

## 2019-08-05 NOTE — Progress Notes (Addendum)
PATIENT: Victor Clarke DOB: [birth year]  REASON FOR VISIT: follow up HISTORY FROM: patient  HISTORY OF PRESENT ILLNESS: Today 08/05/19: Mr. Leser is an 85 year old male with a history of cognitive impairment.  He returns today for follow-up.  [facility] is [facility] living at [facility].  He reports that he has not had any alcohol since the beginning of the year.  He is here today alone.  He reports that his memory has remained stable.  He is able to complete all ADLs independently.  He states that he eats all his meals at [facility].  Denies any significant changes with his mood or behavior.  He does not operate a motor vehicle.  The patient spends a lot of the visit discussing his driving privileges.  He feels that they were revoked unnecessarily.  He denies any new issues.  He returns today for evaluation.   HISTORY (copied from Dr. Cathren Laine note) Interval history January 05, 2019: Patient is here for follow-up.  I saw him back in 2017.  In the meantime he is also seen other neurologist including Dr. Narda Amber at [facility] who is an excellent physician.   He is here and doesn't know why he is here. He says he doesn't use alcohol and he has never had a problem with alcohol. He presented to the emergency room 12/01/2018 after a fall with elevated ethanol levels in the blood. Gait difficulty thought to be due to alcoholism and thiamine deficiency. MRi of the brain did not show anything aute. Started on high-dose Thiamine, tsh and b-12 were normal. Long history of alcohol abuse. He is being restricted from driving and he is upset, that he is a danger on the road.   He declines another driving test. He declines repeat memory testing which was recommended. He is quiyte angry he cannot drive, he has poor insight into the dangers he poses.   REVIEW OF SYSTEMS: Out of a complete 14 system review of symptoms, the patient complains only of the following symptoms, and all other reviewed systems are  negative.  See HPI  ALLERGIES: No Known Allergies  HOME MEDICATIONS: Outpatient Medications Prior to Visit  Medication Sig Dispense Refill  . amLODipine-benazepril (LOTREL) 5-20 MG capsule Take 1 capsule by mouth daily.    Marland Kitchen aspirin EC 81 MG tablet Take 81 mg by mouth every Monday, Wednesday, and Friday.     . Cholecalciferol (VITAMIN D3) 5000 units CAPS Take 10,000 Units by mouth daily.    . Dermatological Products, Misc. (LEVICYN) GEL Apply 1 application topically daily.    . feeding supplement, ENSURE ENLIVE, (ENSURE ENLIVE) LIQD Take 237 mLs by mouth 3 (three) times daily between meals. (Patient not taking: Reported on 01/05/2019) [id number] mL 12  . folic acid (FOLVITE) 1 MG tablet Take 1 tablet (1 mg total) by mouth daily. 30 tablet 0  . LORazepam (ATIVAN) 0.5 MG tablet Take 0.5 tablets by mouth daily as needed for agitation or anxiety.    . Magnesium Oxide 250 MG TABS Take 500 mg by mouth daily.    . Multiple Vitamin (MULTIVITAMIN) tablet Take 1 tablet by mouth daily.    Marland Kitchen nystatin cream (MYCOSTATIN) Apply topically 2 (two) times daily. Apply on bilateral groins 30 g 0  . rosuvastatin (CRESTOR) 20 MG tablet Take 20 mg by mouth daily.   1  . thiamine (VITAMIN B-1) 100 MG tablet Take 1 tablet (100 mg total) by mouth daily. 30 tablet 0  . VITAMIN D PO  Take 1,000 Units by mouth daily.     No facility-administered medications prior to visit.     PAST MEDICAL HISTORY: Past Medical History:  Diagnosis Date  . Alcoholic peripheral neuropathy ([place])   . Allergy   . Anemia   . Arthritis   . Cataract    left eye cataract removed  . COPD (chronic obstructive pulmonary disease) ([place])    pt denies 02-03-17  . Dehydration   . Detached retina    a. s/p surgical correction on the right.  . Ectopic atrial tachycardia ([place])    a. 08/2014 Echo: EF 55-60%, mildly dil LA.  Marland Kitchen EtOH dependence ([place])   . Fall   . Falls frequently 11/2018  . Gout   . Gout   . Hypercholesteremia   . Hypertension    . Left inguinal hernia    a. s/p repair ~ 10 yrs ago.  . Malnutrition ([place])   . Orthostatic hypotension   . Parkinson's disease ([place])   . Prostate cancer ([place])    a. s/p TURP.    PAST SURGICAL HISTORY: Past Surgical History:  Procedure Laterality Date  . CATARACT EXTRACTION     on eye, possibly right eye  . COLONOSCOPY    . COLONOSCOPY W/ POLYPECTOMY  2001, 2006  . HERNIA REPAIR    . PROSTATE SURGERY      FAMILY HISTORY: Family History  Problem Relation Age of Onset  . Stroke Father        died @ 64.  Marland Kitchen Heart attack Mother        died @ 47  . Stroke Brother        died in his 37's  . Heart attack Brother        died in his 77's  . Colon cancer Neg Hx   . Esophageal cancer Neg Hx   . Rectal cancer Neg Hx   . Stomach cancer Neg Hx   . Prostate cancer Neg Hx     SOCIAL HISTORY: Social History   Socioeconomic History  . Marital status: Widowed    Spouse name: Not on file  . Number of children: 0  . Years of education: College  . Highest education level: Not on file  Occupational History  . Occupation: Retired    Fish farm manager: OTHER  Social Needs  . Financial resource strain: Not on file  . Food insecurity    Worry: Not on file    Inability: Not on file  . Transportation needs    Medical: Not on file    Non-medical: Not on file  Tobacco Use  . Smoking status: Former Smoker    Packs/day: 2.00    Years: 22.00    Pack years: 44.00    Types: Cigarettes    Quit date: 11/18/1977    Years since quitting: 41.7  . Smokeless tobacco: Never Used  Substance and Sexual Activity  . Alcohol use: No    Alcohol/week: 2.0 standard drinks    Types: 1 Shots of liquor, 1 Standard drinks or equivalent per week    Comment: update 01/05/2019 none in past month   . Drug use: No  . Sexual activity: Never  Lifestyle  . Physical activity    Days per week: Not on file    Minutes per session: Not on file  . Stress: Not on file  Relationships  . Social Herbalist on  phone: Not on file    Gets together: Not on file  Attends religious service: Not on file    Active member of club or organization: Not on file    Attends meetings of clubs or organizations: Not on file    Relationship status: Not on file  . Intimate partner violence    Fear of current or ex partner: Not on file    Emotionally abused: Not on file    Physically abused: Not on file    Forced sexual activity: Not on file  Other Topics Concern  . Not on file  Social History Narrative   Patient lives at an assisted living facility, [facility].  Has no children.     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     Education: college degree.   Caffeine Use: 1 cup occasionally      PHYSICAL EXAM  Vitals:   08/05/19 0910  BP: 112/76  Pulse: 71  Temp: 98.4 F (36.9 C)  Weight: 159 lb 9.6 oz (72.4 kg)  Height: 5' 9.5" (1.765 m)   Body mass index is 23.23 kg/m.   MMSE - Mini Mental State Exam 08/05/2019 07/24/2017 04/10/2017  Not completed: - Unable to complete Refused  Orientation to time 3 - -  Orientation to Place 4 - -  Registration 3 - -  Attention/ Calculation 5 - -  Recall 0 - -  Language- name 2 objects 2 - -  Language- repeat 1 - -  Language- follow 3 step command 3 - -  Language- read & follow direction 1 - -  Language-read & follow direction-comments named 10 animals - -  Write a sentence 1 - -  Copy design 0 - -  Total score 23 - -     Generalized: Well developed, in no acute distress   Neurological examination  Mentation: Alert oriented to time, place, history taking. Follows all commands speech and language fluent Cranial nerve II-XII: Pupils were equal round reactive to light. Extraocular movements were full, visual field were full on confrontational test.  Head turning and shoulder shrug  were normal and symmetric. Motor: The motor testing reveals 5 over 5 strength of all 4 extremities. Good symmetric motor tone is noted throughout.   Sensory: Sensory testing is intact to soft touch on all 4 extremities. No evidence of extinction is noted.  Coordination: Cerebellar testing reveals good finger-nose-finger and heel-to-shin bilaterally.  Gait and station: Patient uses a cane when ambulating. Reflexes: Deep tendon reflexes are symmetric and normal bilaterally.   DIAGNOSTIC DATA (LABS, IMAGING, TESTING) - I reviewed patient records, labs, notes, testing and imaging myself where available.  Lab Results  Component Value Date   WBC 4.9 12/07/2018   HGB 12.6 (L) 12/07/2018   HCT 36.8 (L) 12/07/2018   MCV 102.2 (H) 12/07/2018   PLT 175 12/07/2018      Component Value Date/Time   NA 142 12/08/2018 0631   NA 143 06/30/2018 1021   K 3.8 12/08/2018 0631   CL 110 12/08/2018 0631   CO2 23 12/08/2018 0631   GLUCOSE 123 (H) 12/08/2018 0631   BUN 20 12/08/2018 0631   BUN 19 06/30/2018 1021   CREATININE 1.00 12/08/2018 0631   CALCIUM 9.5 12/08/2018 0631   PROT 5.7 (L) 12/02/2018 0452   ALBUMIN 3.1 (L) 12/02/2018 0452   AST 49 (H) 12/02/2018 0452   ALT 36 12/02/2018 0452   ALKPHOS 39 12/02/2018 0452   BILITOT 1.3 (H) 12/02/2018 0452   GFRNONAA >60 12/08/2018 0631   GFRAA >60 12/08/2018  [id number]   Lab Results  Component Value Date   CHOL 167 06/30/2018   HDL 45 06/30/2018   LDLCALC 105 (H) 06/30/2018   TRIG 86 06/30/2018   CHOLHDL 3.7 06/30/2018   Lab Results  Component Value Date   HGBA1C 5.4 12/01/2018   Lab Results  Component Value Date   [id number] 12/01/2018   Lab Results  Component Value Date   TSH 0.587 12/01/2018      ASSESSMENT AND PLAN 85 y.o. year old male  has a past medical history of Alcoholic peripheral neuropathy ([place]), Allergy, Anemia, Arthritis, Cataract, COPD (chronic obstructive pulmonary disease) ([place]), Dehydration, Detached retina, Ectopic atrial tachycardia ([place]), EtOH dependence ([place]), Fall, Falls frequently (11/2018), Gout, Gout, Hypercholesteremia, Hypertension, Left inguinal  hernia, Malnutrition ([place]), Orthostatic hypotension, Parkinson's disease ([place]), and Prostate cancer ([place]). here with :  1.  Cognitive impairment  The patient's memory score today was 23 out of 30.  There was no previous memory test to compare to.  For now we will continue to monitor the memory.  We will recheck in 6 months if there is a decline in his memory score we will consider adding medication.  He is advised that if his symptoms worsen or he develops new symptoms he should let us know.   I spent 15 minutes with the patient. 50% of this time was spent reviewing plan of care   Ward Givens, MSN, NP-C 08/05/2019, 8:54 AM [facility] [address] [place], [place] [address] [phone]  Made any corrections needed, and agree with history, physical, neuro exam,assessment and plan as stated.     Sarina Ill, MD [facility]

## 2019-08-16 ENCOUNTER — Telehealth: Payer: Self-pay | Admitting: Adult Health

## 2019-08-16 NOTE — Telephone Encounter (Signed)
Mailed to pt at his new address ([facility]) [address], [place], [place]. Placed in mail today. He was appreciative.

## 2019-08-16 NOTE — Telephone Encounter (Signed)
Pt called in and wants to know if AVS from 08/05/2019 can be mailed to his home

## 2019-09-02 ENCOUNTER — Ambulatory Visit: Payer: Medicare Other | Admitting: Family Medicine

## 2019-09-22 ENCOUNTER — Ambulatory Visit: Payer: Self-pay | Admitting: Family Medicine

## 2019-09-30 ENCOUNTER — Ambulatory Visit: Payer: Medicare Other | Admitting: Family Medicine

## 2019-09-30 ENCOUNTER — Telehealth: Payer: Self-pay | Admitting: *Deleted

## 2019-09-30 NOTE — Telephone Encounter (Signed)
I called pt,  Relayed that last seen in September 2020, no need to come in unless having a problem.  He stated that he had thought as well, that he did not need to be seen.  He was asking about DMV form that he received.  pcp has filled out there part and stated that neuro needed to fill out our part.  He will fax to [place] and and once reviewed and if completed will let him know. (will need release and payment). He had MR fax #.

## 2019-10-14 ENCOUNTER — Inpatient Hospital Stay (HOSPITAL_COMMUNITY)
Admission: EM | Admit: 2019-10-14 | Discharge: 2019-10-26 | DRG: 177 | Disposition: A | Discharge status: Skilled Nursing Facility | Payer: Medicare Other | Attending: Internal Medicine | Admitting: Internal Medicine

## 2019-10-14 ENCOUNTER — Emergency Department (HOSPITAL_COMMUNITY): Payer: Medicare Other

## 2019-10-14 ENCOUNTER — Other Ambulatory Visit: Payer: Self-pay

## 2019-10-14 DIAGNOSIS — E860 Dehydration: Secondary | ICD-10-CM | POA: Diagnosis present

## 2019-10-14 DIAGNOSIS — Z7189 Other specified counseling: Secondary | ICD-10-CM | POA: Diagnosis not present

## 2019-10-14 DIAGNOSIS — E7800 Pure hypercholesterolemia, unspecified: Secondary | ICD-10-CM | POA: Diagnosis present

## 2019-10-14 DIAGNOSIS — R4189 Other symptoms and signs involving cognitive functions and awareness: Secondary | ICD-10-CM | POA: Diagnosis present

## 2019-10-14 DIAGNOSIS — Z515 Encounter for palliative care: Secondary | ICD-10-CM | POA: Diagnosis not present

## 2019-10-14 DIAGNOSIS — H9190 Unspecified hearing loss, unspecified ear: Secondary | ICD-10-CM | POA: Diagnosis present

## 2019-10-14 DIAGNOSIS — F1020 Alcohol dependence, uncomplicated: Secondary | ICD-10-CM | POA: Diagnosis present

## 2019-10-14 DIAGNOSIS — M109 Gout, unspecified: Secondary | ICD-10-CM | POA: Diagnosis present

## 2019-10-14 DIAGNOSIS — J189 Pneumonia, unspecified organism: Secondary | ICD-10-CM | POA: Diagnosis present

## 2019-10-14 DIAGNOSIS — E785 Hyperlipidemia, unspecified: Secondary | ICD-10-CM | POA: Diagnosis present

## 2019-10-14 DIAGNOSIS — R54 Age-related physical debility: Secondary | ICD-10-CM | POA: Diagnosis present

## 2019-10-14 DIAGNOSIS — E876 Hypokalemia: Secondary | ICD-10-CM | POA: Diagnosis not present

## 2019-10-14 DIAGNOSIS — Z79899 Other long term (current) drug therapy: Secondary | ICD-10-CM

## 2019-10-14 DIAGNOSIS — Y95 Nosocomial condition: Secondary | ICD-10-CM | POA: Diagnosis present

## 2019-10-14 DIAGNOSIS — J1289 Other viral pneumonia: Secondary | ICD-10-CM | POA: Diagnosis present

## 2019-10-14 DIAGNOSIS — I959 Hypotension, unspecified: Secondary | ICD-10-CM | POA: Diagnosis not present

## 2019-10-14 DIAGNOSIS — F0280 Dementia in other diseases classified elsewhere without behavioral disturbance: Secondary | ICD-10-CM | POA: Diagnosis present

## 2019-10-14 DIAGNOSIS — R7989 Other specified abnormal findings of blood chemistry: Secondary | ICD-10-CM

## 2019-10-14 DIAGNOSIS — U071 COVID-19: Secondary | ICD-10-CM | POA: Diagnosis present

## 2019-10-14 DIAGNOSIS — R001 Bradycardia, unspecified: Secondary | ICD-10-CM | POA: Diagnosis not present

## 2019-10-14 DIAGNOSIS — J440 Chronic obstructive pulmonary disease with acute lower respiratory infection: Secondary | ICD-10-CM | POA: Diagnosis present

## 2019-10-14 DIAGNOSIS — R634 Abnormal weight loss: Secondary | ICD-10-CM | POA: Diagnosis present

## 2019-10-14 DIAGNOSIS — I440 Atrioventricular block, first degree: Secondary | ICD-10-CM | POA: Diagnosis present

## 2019-10-14 DIAGNOSIS — R627 Adult failure to thrive: Secondary | ICD-10-CM | POA: Diagnosis present

## 2019-10-14 DIAGNOSIS — I340 Nonrheumatic mitral (valve) insufficiency: Secondary | ICD-10-CM | POA: Diagnosis not present

## 2019-10-14 DIAGNOSIS — N179 Acute kidney failure, unspecified: Secondary | ICD-10-CM | POA: Diagnosis present

## 2019-10-14 DIAGNOSIS — R296 Repeated falls: Secondary | ICD-10-CM | POA: Diagnosis present

## 2019-10-14 DIAGNOSIS — Z6823 Body mass index (BMI) 23.0-23.9, adult: Secondary | ICD-10-CM

## 2019-10-14 DIAGNOSIS — I10 Essential (primary) hypertension: Secondary | ICD-10-CM | POA: Diagnosis present

## 2019-10-14 DIAGNOSIS — E871 Hypo-osmolality and hyponatremia: Secondary | ICD-10-CM | POA: Diagnosis present

## 2019-10-14 DIAGNOSIS — Z66 Do not resuscitate: Secondary | ICD-10-CM | POA: Diagnosis present

## 2019-10-14 DIAGNOSIS — Z87891 Personal history of nicotine dependence: Secondary | ICD-10-CM

## 2019-10-14 DIAGNOSIS — Z8546 Personal history of malignant neoplasm of prostate: Secondary | ICD-10-CM

## 2019-10-14 DIAGNOSIS — I951 Orthostatic hypotension: Secondary | ICD-10-CM | POA: Diagnosis not present

## 2019-10-14 DIAGNOSIS — F0390 Unspecified dementia without behavioral disturbance: Secondary | ICD-10-CM | POA: Diagnosis present

## 2019-10-14 DIAGNOSIS — Z86010 Personal history of colonic polyps: Secondary | ICD-10-CM

## 2019-10-14 DIAGNOSIS — Z8249 Family history of ischemic heart disease and other diseases of the circulatory system: Secondary | ICD-10-CM

## 2019-10-14 DIAGNOSIS — Z7982 Long term (current) use of aspirin: Secondary | ICD-10-CM

## 2019-10-14 DIAGNOSIS — J1282 Pneumonia due to coronavirus disease 2019: Secondary | ICD-10-CM | POA: Diagnosis present

## 2019-10-14 LAB — CBC WITH DIFFERENTIAL/PLATELET
Abs Immature Granulocytes: 0.02 10*3/uL (ref 0.00–0.07)
Basophils Absolute: 0 10*3/uL (ref 0.0–0.1)
Basophils Relative: 1 %
Eosinophils Absolute: 0 10*3/uL (ref 0.0–0.5)
Eosinophils Relative: 0 %
HCT: 41.7 % (ref 39.0–52.0)
Hemoglobin: 13.8 g/dL (ref 13.0–17.0)
Immature Granulocytes: 1 %
Lymphocytes Relative: 16 %
Lymphs Abs: 0.5 10*3/uL — ABNORMAL LOW (ref 0.7–4.0)
MCH: 30.3 pg (ref 26.0–34.0)
MCHC: 33.1 g/dL (ref 30.0–36.0)
MCV: 91.4 fL (ref 80.0–100.0)
Monocytes Absolute: 0.4 10*3/uL (ref 0.1–1.0)
Monocytes Relative: 13 %
Neutro Abs: 2.1 10*3/uL (ref 1.7–7.7)
Neutrophils Relative %: 69 %
Platelets: 135 10*3/uL — ABNORMAL LOW (ref 150–400)
RBC: 4.56 MIL/uL (ref 4.22–5.81)
RDW: 13.6 % (ref 11.5–15.5)
WBC: 3.1 10*3/uL — ABNORMAL LOW (ref 4.0–10.5)
nRBC: 0 % (ref 0.0–0.2)

## 2019-10-14 LAB — BASIC METABOLIC PANEL
Anion gap: 8 (ref 5–15)
BUN: 26 mg/dL — ABNORMAL HIGH (ref 8–23)
CO2: 23 mmol/L (ref 22–32)
Calcium: 8.5 mg/dL — ABNORMAL LOW (ref 8.9–10.3)
Chloride: 102 mmol/L (ref 98–111)
Creatinine, Ser: 1.72 mg/dL — ABNORMAL HIGH (ref 0.61–1.24)
GFR calc Af Amer: 40 mL/min — ABNORMAL LOW (ref >60)
GFR calc non Af Amer: 35 mL/min — ABNORMAL LOW (ref >60)
Glucose, Bld: 118 mg/dL — ABNORMAL HIGH (ref 70–99)
Potassium: 4.8 mmol/L (ref 3.5–5.1)
Sodium: 133 mmol/L — ABNORMAL LOW (ref 135–145)

## 2019-10-14 LAB — HEPATIC FUNCTION PANEL
ALT: 14 U/L (ref 0–44)
AST: 25 U/L (ref 15–41)
Albumin: 3.7 g/dL (ref 3.5–5.0)
Alkaline Phosphatase: 39 U/L (ref 38–126)
Bilirubin, Direct: 0.2 mg/dL (ref 0.0–0.2)
Indirect Bilirubin: 0.7 mg/dL (ref 0.3–0.9)
Total Bilirubin: 0.9 mg/dL (ref 0.3–1.2)
Total Protein: 6.9 g/dL (ref 6.5–8.1)

## 2019-10-14 LAB — FERRITIN: Ferritin: 221 ng/mL (ref 24–336)

## 2019-10-14 LAB — LACTIC ACID, PLASMA
Lactic Acid, Venous: 1.1 mmol/L (ref 0.5–1.9)
Lactic Acid, Venous: 0.9 mmol/L (ref 0.5–1.9)

## 2019-10-14 LAB — TRIGLYCERIDES: Triglycerides: 77 mg/dL (ref <150)

## 2019-10-14 LAB — C-REACTIVE PROTEIN: CRP: 3.7 mg/dL — ABNORMAL HIGH (ref <1.0)

## 2019-10-14 LAB — FIBRINOGEN: Fibrinogen: 489 mg/dL — ABNORMAL HIGH (ref 210–475)

## 2019-10-14 LAB — PROCALCITONIN: Procalcitonin: <0.1 ng/mL

## 2019-10-14 LAB — POC SARS CORONAVIRUS 2 AG -  ED: SARS Coronavirus 2 Ag: POSITIVE — AB

## 2019-10-14 LAB — LACTATE DEHYDROGENASE: LDH: 204 U/L — ABNORMAL HIGH (ref 98–192)

## 2019-10-14 LAB — D-DIMER, QUANTITATIVE: D-Dimer, Quant: 1.28 ug/mL-FEU — ABNORMAL HIGH (ref 0.00–0.50)

## 2019-10-14 MED ORDER — ADULT MULTIVITAMIN W/MINERALS CH
1.0000 | ORAL_TABLET | Freq: Every day | ORAL | Status: DC
  Administered 2019-10-15 – 2019-10-26 (×12): 1 via ORAL
  Filled 2019-10-14 (×12): qty 1

## 2019-10-14 MED ORDER — ESCITALOPRAM OXALATE 10 MG PO TABS
10.0000 mg | ORAL_TABLET | Freq: Every day | ORAL | Status: DC
  Administered 2019-10-15 – 2019-10-26 (×12): 10 mg via ORAL
  Filled 2019-10-14 (×12): qty 1

## 2019-10-14 MED ORDER — VITAMIN D 25 MCG (1000 UNIT) PO TABS
1000.0000 [IU] | ORAL_TABLET | Freq: Every day | ORAL | Status: DC
  Administered 2019-10-15 – 2019-10-26 (×12): 1000 [IU] via ORAL
  Filled 2019-10-14 (×12): qty 1

## 2019-10-14 MED ORDER — ROSUVASTATIN CALCIUM 10 MG PO TABS
10.0000 mg | ORAL_TABLET | Freq: Every day | ORAL | Status: DC
  Administered 2019-10-15 – 2019-10-25 (×12): 10 mg via ORAL
  Filled 2019-10-14 (×12): qty 1

## 2019-10-14 MED ORDER — ALBUTEROL SULFATE HFA 108 (90 BASE) MCG/ACT IN AERS
2.0000 | INHALATION_SPRAY | Freq: Four times a day (QID) | RESPIRATORY_TRACT | Status: DC
  Administered 2019-10-15 (×4): 2 via RESPIRATORY_TRACT
  Filled 2019-10-14: qty 6.7

## 2019-10-14 MED ORDER — DEXAMETHASONE SODIUM PHOSPHATE 10 MG/ML IJ SOLN
6.0000 mg | INTRAMUSCULAR | Status: DC
  Administered 2019-10-15 – 2019-10-16 (×3): 6 mg via INTRAVENOUS
  Filled 2019-10-14 (×3): qty 1

## 2019-10-14 MED ORDER — ENOXAPARIN SODIUM 40 MG/0.4ML ~~LOC~~ SOLN
40.0000 mg | Freq: Every day | SUBCUTANEOUS | Status: DC
  Administered 2019-10-15 – 2019-10-26 (×12): 40 mg via SUBCUTANEOUS
  Filled 2019-10-14 (×12): qty 0.4

## 2019-10-14 MED ORDER — SODIUM CHLORIDE 0.9 % IV SOLN
INTRAVENOUS | Status: DC
  Administered 2019-10-15 – 2019-10-16 (×3): via INTRAVENOUS

## 2019-10-14 MED ORDER — SODIUM CHLORIDE 0.9 % IV SOLN
1.0000 g | INTRAVENOUS | Status: DC
  Administered 2019-10-15 – 2019-10-18 (×5): 1 g via INTRAVENOUS
  Filled 2019-10-14 (×4): qty 1
  Filled 2019-10-14: qty 10
  Filled 2019-10-14: qty 1

## 2019-10-14 MED ORDER — HYDROCOD POLST-CPM POLST ER 10-8 MG/5ML PO SUER
5.0000 mL | Freq: Two times a day (BID) | ORAL | Status: DC | PRN
  Administered 2019-10-15 – 2019-10-17 (×3): 5 mL via ORAL
  Filled 2019-10-14 (×3): qty 5

## 2019-10-14 MED ORDER — GUAIFENESIN-DM 100-10 MG/5ML PO SYRP: 10.0000 mL | ORAL_SOLUTION | ORAL | Status: DC | PRN

## 2019-10-14 MED ORDER — SODIUM CHLORIDE 0.9 % IV SOLN
100.0000 mg | INTRAVENOUS | Status: AC
  Administered 2019-10-15 – 2019-10-18 (×4): 100 mg via INTRAVENOUS
  Filled 2019-10-14 (×4): qty 20

## 2019-10-14 MED ORDER — SODIUM CHLORIDE 0.9 % IV BOLUS
500.0000 mL | Freq: Once | INTRAVENOUS | Status: AC
  Administered 2019-10-14: 500 mL via INTRAVENOUS

## 2019-10-14 MED ORDER — ASPIRIN EC 81 MG PO TBEC
81.0000 mg | DELAYED_RELEASE_TABLET | ORAL | Status: DC
  Administered 2019-10-15 – 2019-10-25 (×5): 81 mg via ORAL
  Filled 2019-10-14 (×8): qty 1

## 2019-10-14 MED ORDER — SODIUM CHLORIDE 0.9 % IV SOLN
500.0000 mg | INTRAVENOUS | Status: DC
  Administered 2019-10-15 – 2019-10-16 (×3): 500 mg via INTRAVENOUS
  Filled 2019-10-14 (×3): qty 500

## 2019-10-14 MED ORDER — MAGNESIUM OXIDE 400 (241.3 MG) MG PO TABS
400.0000 mg | ORAL_TABLET | Freq: Every day | ORAL | Status: DC
  Administered 2019-10-15 – 2019-10-26 (×12): 400 mg via ORAL
  Filled 2019-10-14 (×12): qty 1

## 2019-10-14 MED ORDER — ACETAMINOPHEN 325 MG PO TABS
650.0000 mg | ORAL_TABLET | Freq: Four times a day (QID) | ORAL | Status: DC | PRN
  Administered 2019-10-20 – 2019-10-25 (×2): 650 mg via ORAL
  Filled 2019-10-14 (×2): qty 2

## 2019-10-14 MED ORDER — SODIUM CHLORIDE 0.9 % IV SOLN
200.0000 mg | Freq: Once | INTRAVENOUS | Status: AC
  Administered 2019-10-14: 200 mg via INTRAVENOUS
  Filled 2019-10-14: qty 40

## 2019-10-14 MED ORDER — ONDANSETRON HCL 4 MG/2ML IJ SOLN: 4.0000 mg | Freq: Four times a day (QID) | INTRAMUSCULAR | Status: DC | PRN

## 2019-10-14 MED ORDER — ONDANSETRON HCL 4 MG PO TABS: 4.0000 mg | ORAL_TABLET | Freq: Four times a day (QID) | ORAL | Status: DC | PRN

## 2019-10-14 MED ORDER — ALPRAZOLAM 0.25 MG PO TABS
0.1250 mg | ORAL_TABLET | Freq: Two times a day (BID) | ORAL | Status: DC
  Administered 2019-10-15 – 2019-10-19 (×10): 0.125 mg via ORAL
  Filled 2019-10-14 (×10): qty 1

## 2019-10-14 MED ORDER — ACETAMINOPHEN 325 MG PO TABS
650.0000 mg | ORAL_TABLET | Freq: Once | ORAL | Status: AC
  Administered 2019-10-14: 650 mg via ORAL
  Filled 2019-10-14: qty 2

## 2019-10-14 NOTE — ED Triage Notes (Signed)
Pt arrived via [facility] from [facility] Fever/COVID concern. Pt was tested on Tuesday but result not back yet. Pt reports minor cough, lack of appetite and dizziness upon standing. Pt denies SOB , sore throat or pain.   Hx dementia DNR form present at bedside

## 2019-10-14 NOTE — ED Notes (Signed)
Pt made aware need urine sample 

## 2019-10-14 NOTE — ED Provider Notes (Signed)
[facility] DEPT Provider Note   CSN: [id number] Arrival date & time: 10/14/19  1659     History   Chief Complaint Chief Complaint  Patient presents with  . Fever    COVID Concern     HPI Victor Clarke is a 85 y.o. male.     Patient is a 85 year old male who was brought in by EMS from [facility] for possible Covid symptoms.  Per EMS, patient has had fever and coughing and was tested on Tuesday but the results have not come back yet.  Patient reports some lightheadedness on standing.  He denies any vertiginous type symptoms.  He feels more fatigued than normal.  He denies any shortness of breath.  He does report a mild cough and runny nose.  No chest pain.  No vomiting.  No diarrhea.  He is not on home oxygen.  He does have a history of dementia but is oriented to person, place and year on my exam.  He does report some discomfort to his genital area     Past Medical History:  Diagnosis Date  . Alcoholic peripheral neuropathy ([place])   . Allergy   . Anemia   . Arthritis   . Cataract    left eye cataract removed  . COPD (chronic obstructive pulmonary disease) ([place])    pt denies 02-03-17  . Dehydration   . Detached retina    a. s/p surgical correction on the right.  . Ectopic atrial tachycardia ([place])    a. 08/2014 Echo: EF 55-60%, mildly dil LA.  Marland Kitchen EtOH dependence ([place])   . Fall   . Falls frequently 11/2018  . Gout   . Gout   . Hypercholesteremia   . Hypertension   . Left inguinal hernia    a. s/p repair ~ 10 yrs ago.  . Malnutrition ([place])   . Orthostatic hypotension   . Parkinson's disease ([place])   . Prostate cancer ([place])    a. s/p TURP.    Patient Active Problem List   Diagnosis Date Noted  . Pneumonia due to COVID-19 virus 10/14/2019  . Hyponatremia 10/14/2019  . Cognitive impairment 01/05/2019  . Problematic consumption of alcohol 12/01/2018  . Tooth impaction 12/01/2018  . Ataxia 12/01/2018  . First  degree AV block   . Hypotension due to hypovolemia   . Syncope 06/18/2018  . Leukopenia 06/18/2018  . AV block, [place] II 06/18/2018  . Memory loss 10/22/2016  . Malnutrition of moderate degree 11/15/2015  . Fall from steps 11/07/2015  . Anemia 11/07/2015  . Alcohol dependence ([place]) 11/07/2015  . Pyuria 11/07/2015  . Dehydration 08/08/2015  . UTI (urinary tract infection) 08/08/2015  . High anion gap metabolic acidosis [id number]  . Dizziness 07/31/2015  . Orthostatic hypotension 07/31/2015  . Lactic acidosis 07/31/2015  . Ectopic atrial tachycardia ([place])   . Hypercholesteremia   . Dyspnea on exertion 09/16/2014  . Hypertension 09/16/2014    Past Surgical History:  Procedure Laterality Date  . CATARACT EXTRACTION     on eye, possibly right eye  . COLONOSCOPY    . COLONOSCOPY W/ POLYPECTOMY  2001, 2006  . HERNIA REPAIR    . PROSTATE SURGERY          Home Medications    Prior to Admission medications   Medication Sig Start Date End Date Taking? Authorizing Provider  ALPRAZolam (XANAX) 0.25 MG tablet Take 0.125 mg by mouth 2 (two) times daily.  07/10/19  Yes  [provider]  amLODipine-benazepril (LOTREL) 5-20 MG capsule Take 1 capsule by mouth daily.   Yes [provider]  aspirin EC 81 MG tablet Take 81 mg by mouth every Monday, Wednesday, and Friday.    Yes [provider]  cholecalciferol (VITAMIN D3) 25 MCG (1000 UT) tablet Take 1,000 Units by mouth daily.   Yes [provider]  [employer] Extract 2 % SHAM Apply 1 application topically 2 (two) times a week.   Yes [provider]  escitalopram (LEXAPRO) 10 MG tablet Take 10 mg by mouth daily.   Yes [provider]  Magnesium Oxide 250 MG TABS Take 500 mg by mouth daily.   Yes [provider]  miconazole (MICOTIN) 2 % cream Apply 1 application topically daily as needed (rash, jock itch).   Yes [provider]  Multiple Vitamin (MULTIVITAMIN) tablet  Take 1 tablet by mouth daily.   Yes [provider]  nystatin cream (MYCOSTATIN) Apply topically 2 (two) times daily. Apply on bilateral groins Patient taking differently: Apply 1 application topically 2 (two) times daily as needed for dry skin. Apply on bilateral groins 12/09/18  Yes Adhikari, Tamsen Meek, MD  nystatin-triamcinolone (MYCOLOG II) cream Apply 1 application topically 2 (two) times daily.   Yes [provider]  rosuvastatin (CRESTOR) 10 MG tablet Take 10 mg by mouth at bedtime.  03/17/18  Yes [provider]  triamcinolone cream (KENALOG) 0.1 % Apply 1 application topically 2 (two) times daily.   Yes [provider]  VITAMIN D PO Take 1,000 Units by mouth daily.   Yes [provider]    Family History Family History  Problem Relation Age of Onset  . Stroke Father        died @ 73.  Marland Kitchen Heart attack Mother        died @ 54  . Stroke Brother        died in his 45's  . Heart attack Brother        died in his 66's  . Colon cancer Neg Hx   . Esophageal cancer Neg Hx   . Rectal cancer Neg Hx   . Stomach cancer Neg Hx   . Prostate cancer Neg Hx     Social History Social History   Tobacco Use  . Smoking status: Former Smoker    Packs/day: 2.00    Years: 22.00    Pack years: 44.00    Types: Cigarettes    Quit date: 11/18/1977    Years since quitting: 41.9  . Smokeless tobacco: Never Used  Substance Use Topics  . Alcohol use: No    Alcohol/week: 2.0 standard drinks    Types: 1 Shots of liquor, 1 Standard drinks or equivalent per week    Comment: update 01/05/2019 none in past month   . Drug use: No     Allergies   Patient has no known allergies.   Review of Systems Review of Systems  Constitutional: Positive for appetite change and fatigue. Negative for chills, diaphoresis and fever.  HENT: Positive for congestion and rhinorrhea. Negative for sneezing.   Eyes: Negative.   Respiratory: Positive for cough. Negative for chest  tightness and shortness of breath.   Cardiovascular: Negative for chest pain and leg swelling.  Gastrointestinal: Negative for abdominal pain, blood in stool, diarrhea, nausea and vomiting.  Genitourinary: Negative for difficulty urinating, flank pain, frequency and hematuria.  Musculoskeletal: Negative for arthralgias and back pain.  Skin: Negative for rash.  Neurological: Positive for light-headedness. Negative for dizziness, speech difficulty, weakness, numbness and headaches.     Physical Exam Updated Vital Signs BP 125/64   Pulse 63   Temp 100.2 F (37.9 C)   Resp 19   SpO2 97%   Physical Exam Constitutional:      Appearance: He is well-developed.  HENT:     Head: Normocephalic and atraumatic.  Eyes:     Pupils: Pupils are equal, round, and reactive to light.  Neck:     Musculoskeletal: Normal range of motion and neck supple.  Cardiovascular:     Rate and Rhythm: Normal rate and regular rhythm.     Heart sounds: Normal heart sounds.  Pulmonary:     Effort: Pulmonary effort is normal. No respiratory distress.     Breath sounds: Normal breath sounds. No wheezing or rales.  Chest:     Chest wall: No tenderness.  Abdominal:     General: Bowel sounds are normal.     Palpations: Abdomen is soft.     Tenderness: There is no abdominal tenderness. There is no guarding or rebound.  Genitourinary:    Comments: No tenderness to his testicular area on exam.  No scrotal tenderness.  No swelling noted normal-appearing male circumcised genitalia.  He has an adult diaper on with some urine in the diaper.  He has some mild erythema to his genitals which I feel is related to this.  It does not appear to be infected. Musculoskeletal: Normal range of motion.  Lymphadenopathy:     Cervical: No cervical adenopathy.  Skin:    General: Skin is warm and dry.     Findings: No rash.  Neurological:     General: No focal deficit present.     Mental Status: He is alert and oriented to person,  place, and time.      ED Treatments / Results  Labs (all labs ordered are listed, but only abnormal results are displayed) Labs Reviewed  BASIC METABOLIC PANEL - Abnormal; Notable for the following components:      Result Value   Sodium 133 (*)    Glucose, Bld 118 (*)    BUN 26 (*)    Creatinine, Ser 1.72 (*)    Calcium 8.5 (*)    GFR calc non Af Amer 35 (*)    GFR calc Af Amer 40 (*)    All other components within normal limits  CBC WITH DIFFERENTIAL/PLATELET - Abnormal; Notable for the following components:   WBC 3.1 (*)    Platelets 135 (*)    Lymphs Abs 0.5 (*)    All other components within normal limits  POC SARS CORONAVIRUS 2 AG -  ED - Abnormal; Notable for the following components:   SARS Coronavirus 2 Ag POSITIVE (*)    All other components within normal limits  CULTURE, BLOOD (ROUTINE X 2)  CULTURE, BLOOD (ROUTINE X 2)  URINALYSIS, ROUTINE W REFLEX MICROSCOPIC  LACTIC ACID, PLASMA  LACTIC ACID, PLASMA  D-DIMER, QUANTITATIVE (NOT AT [facility])  PROCALCITONIN  LACTATE DEHYDROGENASE  FERRITIN  TRIGLYCERIDES  FIBRINOGEN  C-REACTIVE PROTEIN    EKG None  Radiology Dg Chest Port 1 View  Result Date: 10/14/2019 CLINICAL DATA:  Cough, fever EXAM: PORTABLE CHEST 1 VIEW COMPARISON:  Chest radiograph dated 12/01/2018 FINDINGS: The heart is enlarged. Vascular calcifications are seen in the aortic arch. Bibasilar interstitial and airspace opacities are noted. There is no pleural effusion or pneumothorax. The osseous structures are intact. IMPRESSION: 1. Bibasilar  interstitial and airspace opacities. This likely represents a combination of underlying chronic interstitial lung disease with superimposed aspiration/pneumonia. 2. Cardiomegaly. Aortic Atherosclerosis (ICD10-I70.0). Electronically Signed   By: Zerita Boers M.D.   On: 10/14/2019 18:14    Procedures Procedures (including critical care time)  Medications Ordered in ED Medications  acetaminophen (TYLENOL) tablet  650 mg (650 mg Oral Given 10/14/19 1749)  sodium chloride 0.9 % bolus 500 mL (500 mLs Intravenous New Bag/Given 10/14/19 1943)     Initial Impression / Assessment and Plan / ED Course  I have reviewed the triage vital signs and the nursing notes.  Pertinent labs & imaging results that were available during my care of the patient were reviewed by me and considered in my medical decision making (see chart for details).        Patient is a 85 year old male who presents with cough, fever and lightheadedness.  I attempted multiple times to contact the nursing facility and never got anyone to answer the phone.  Chest x-ray shows bilateral infiltrates.  His labs show an elevated creatinine but otherwise nonconcerning.  His white count is a little bit low.  He was not hypoxic at rest but when he ambulated in the room he had a desaturation to 88% and was placed on nasal cannula on 2 L/min.  His Covid test was positive.  I spoke with Dr. Jeannie Fend who will admit the patient for further treatment.  Final Clinical Impressions(s) / ED Diagnoses   Final diagnoses:  2019 novel coronavirus disease (COVID-19)  Healthcare-associated pneumonia  Elevated serum creatinine    ED Discharge Orders    None       Malvin Johns, MD 10/14/19 2048

## 2019-10-14 NOTE — ED Notes (Signed)
Pt adjusted in bed, given warm blankets, and a urinal at bedside. Pt is aware of need of urine sample. Call light within reach.

## 2019-10-14 NOTE — ED Notes (Signed)
Pt has given this nurse permission to speak with Janalyn Shy [phone].

## 2019-10-14 NOTE — ED Notes (Signed)
Pt ambulated in room, O2 dropped to 88 while ambulating but quickly returned to 95-95% on RA as soon as pt sat back down. Did place pt on some O2 because he was having more frequent PVCs to see if that helped resolve those.

## 2019-10-14 NOTE — H&P (Signed)
History and Physical   Victor Clarke [id number] DOB: 10-31-1929 DOA: 10/14/2019  Referring MD/NP/PA: Dr Tamera Punt  PCP: Sherald Hess., MD   Outpatient Specialists: None   Patient coming from: Home  Chief Complaint: Fever  HPI: Victor Clarke is a 85 y.o. male with medical history significant of hypertension, COPD, hyperlipidemia, history of alcoholism with memory impairment and alcoholic neuropathy, gout who presented to the ER from assisted living facility at [facility] with concern for Covid symptoms.  Patient has had fever and cough.  He was screen 2 days ago for Covid the results are still pending.  Patient has had some lightheadedness on standing.  At this point is gone.  No diarrhea or abdominal pain no nausea vomiting.  He did have some chills.  Patient was subsequently brought over to evaluated for possible COVID-19.  Evaluation in the ER confirmed COVID-19 infection.  Patient has new oxygen requirement of 2 L/min and he is being admitted to the hospital for treatment..  ED Course: Temperature is 100.2 his blood pressure 124/64 pulse is 57 respirate 23 oxygen sat 95% on 2 L.  Sodium is 133 potassium 4.8 chloride 102 CO2 23 with creatinine 1.72.  LFTs appear to be within normal LDH 204 lipid showed triglyceride of 77 ferritin 221.  CRP 3.7 lactic acid 1.1 procalcitonin less than 0.1.  White count 3.1 otherwise rest of the CBC is within normal.  Chest x-ray showed bibasilar interstitial and airspace opacities consistent with multi lobar pneumonia.  COVID-19 screen is positive so patient is being admitted for treatment  Review of Systems: As per HPI otherwise 10 point review of systems negative.    Past Medical History:  Diagnosis Date   Alcoholic peripheral neuropathy (HCC)    Allergy    Anemia    Arthritis    Cataract    left eye cataract removed   COPD (chronic obstructive pulmonary disease) ([place])    pt denies 02-03-17   Dehydration    Detached retina    a. s/p surgical correction on the right.   Ectopic atrial tachycardia ([place])    a. 08/2014 Echo: EF 55-60%, mildly dil LA.   EtOH dependence ([place])    Fall    Falls frequently 11/2018   Gout    Gout    Hypercholesteremia    Hypertension    Left inguinal hernia    a. s/p repair ~ 10 yrs ago.   Malnutrition ([place])    Orthostatic hypotension    Parkinson's disease ([place])    Prostate cancer ([place])    a. s/p TURP.    Past Surgical History:  Procedure Laterality Date   CATARACT EXTRACTION     on eye, possibly right eye   COLONOSCOPY     COLONOSCOPY W/ POLYPECTOMY  2001, 2006   [place]       reports that he quit smoking about 41 years ago. His smoking use included cigarettes. He has a 44.00 pack-year smoking history. He has never used smokeless tobacco. He reports that he does not drink alcohol or use drugs.  No Known Allergies  Family History  Problem Relation Age of Onset   Stroke Father        died @ 2.   Heart attack Mother        died @ 63   Stroke Brother        died in his 56's   Heart attack Brother  died in his 3's   Colon cancer Neg Hx    Esophageal cancer Neg Hx    Rectal cancer Neg Hx    Stomach cancer Neg Hx    Prostate cancer Neg Hx      Prior to Admission medications   Medication Sig Start Date End Date Taking? Authorizing Provider  ALPRAZolam (XANAX) 0.25 MG tablet Take 0.125 mg by mouth 2 (two) times daily.  07/10/19  Yes [provider]  amLODipine-benazepril (LOTREL) 5-20 MG capsule Take 1 capsule by mouth daily.   Yes [provider]  aspirin EC 81 MG tablet Take 81 mg by mouth every Monday, Wednesday, and Friday.    Yes [provider]  cholecalciferol (VITAMIN D3) 25 MCG (1000 UT) tablet Take 1,000 Units by mouth daily.   Yes [provider]  [employer] Extract 2 % SHAM Apply 1 application topically 2 (two) times a week.   Yes [provider]    escitalopram (LEXAPRO) 10 MG tablet Take 10 mg by mouth daily.   Yes [provider]  Magnesium Oxide 250 MG TABS Take 500 mg by mouth daily.   Yes [provider]  miconazole (MICOTIN) 2 % cream Apply 1 application topically daily as needed (rash, jock itch).   Yes [provider]  Multiple Vitamin (MULTIVITAMIN) tablet Take 1 tablet by mouth daily.   Yes [provider]  nystatin cream (MYCOSTATIN) Apply topically 2 (two) times daily. Apply on bilateral groins Patient taking differently: Apply 1 application topically 2 (two) times daily as needed for dry skin. Apply on bilateral groins 12/09/18  Yes Adhikari, Tamsen Meek, MD  nystatin-triamcinolone (MYCOLOG II) cream Apply 1 application topically 2 (two) times daily.   Yes [provider]  rosuvastatin (CRESTOR) 10 MG tablet Take 10 mg by mouth at bedtime.  03/17/18  Yes [provider]  triamcinolone cream (KENALOG) 0.1 % Apply 1 application topically 2 (two) times daily.   Yes [provider]  VITAMIN D PO Take 1,000 Units by mouth daily.   Yes [provider]    Physical Exam: Vitals:   10/14/19 1720 10/14/19 1730 10/14/19 1909 10/14/19 1945  BP: (!) 120/57 103/63 114/67 125/64  Pulse: 75 71 69 63  Resp: 18 18 16 19   Temp: 100.2 F (37.9 C)     SpO2: 97% 95% 95% 97%      Constitutional: NAD, acutely ill Vitals:   10/14/19 1720 10/14/19 1730 10/14/19 1909 10/14/19 1945  BP: (!) 120/57 103/63 114/67 125/64  Pulse: 75 71 69 63  Resp: 18 18 16 19   Temp: 100.2 F (37.9 C)     SpO2: 97% 95% 95% 97%   Eyes: PERRL, lids and conjunctivae normal ENMT: Mucous membranes are dry. Posterior pharynx clear of any exudate or lesions.Normal dentition.  Neck: normal, supple, no masses, no thyromegaly Respiratory: Coarse breath sounds with bilateral rhonchi, no expiratory wheezing normal respiratory effort. No accessory muscle use.  Cardiovascular: Regular rate and rhythm, no  murmurs / rubs / gallops. No extremity edema. 2+ pedal pulses. No carotid bruits.  Abdomen: no tenderness, no masses palpated. No hepatosplenomegaly. Bowel sounds positive.  Musculoskeletal: no clubbing / cyanosis. No joint deformity upper and lower extremities. Good ROM, no contractures. Normal muscle tone.  Skin: no rashes, lesions, ulcers. No induration Neurologic: CN 2-12 grossly intact. Sensation intact, DTR normal. Strength 5/5 in all 4.  Psychiatric: Normal judgment and insight. Alert and oriented x 3. Normal mood.  Labs on Admission: I have personally reviewed following labs and imaging studies  CBC: Recent Labs  Lab 10/14/19 1756  WBC 3.1*  NEUTROABS 2.1  HGB 13.8  HCT 41.7  MCV 91.4  PLT [id number]*   Basic Metabolic Panel: Recent Labs  Lab 10/14/19 1756  NA 133*  K 4.8  CL 102  CO2 23  GLUCOSE 118*  BUN 26*  CREATININE 1.72*  CALCIUM 8.5*   GFR: CrCl cannot be calculated (Unknown ideal weight.). Liver Function Tests: No results for input(s): AST, ALT, ALKPHOS, BILITOT, PROT, ALBUMIN in the last 168 hours. No results for input(s): LIPASE, AMYLASE in the last 168 hours. No results for input(s): AMMONIA in the last 168 hours. Coagulation Profile: No results for input(s): INR, PROTIME in the last 168 hours. Cardiac Enzymes: No results for input(s): CKTOTAL, CKMB, CKMBINDEX, TROPONINI in the last 168 hours. BNP (last 3 results) No results for input(s): PROBNP in the last 8760 hours. HbA1C: No results for input(s): HGBA1C in the last 72 hours. CBG: No results for input(s): GLUCAP in the last 168 hours. Lipid Profile: No results for input(s): CHOL, HDL, LDLCALC, TRIG, CHOLHDL, LDLDIRECT in the last 72 hours. Thyroid Function Tests: No results for input(s): TSH, T4TOTAL, FREET4, T3FREE, THYROIDAB in the last 72 hours. Anemia Panel: No results for input(s): VITAMINB12, FOLATE, FERRITIN, TIBC, IRON, RETICCTPCT in the last 72 hours. Urine analysis:    Component  Value Date/Time   COLORURINE YELLOW 12/01/2018 1850   APPEARANCEUR CLEAR 12/01/2018 1850   LABSPEC 1.023 12/01/2018 1850   PHURINE 6.0 12/01/2018 1850   GLUCOSEU 50 (A) 12/01/2018 1850   HGBUR NEGATIVE 12/01/2018 1850   BILIRUBINUR NEGATIVE 12/01/2018 1850   KETONESUR 5 (A) 12/01/2018 1850   PROTEINUR 30 (A) 12/01/2018 1850   UROBILINOGEN 1.0 08/05/2015 1155   NITRITE NEGATIVE 12/01/2018 1850   LEUKOCYTESUR NEGATIVE 12/01/2018 1850   Sepsis Labs: @LABRCNTIP (procalcitonin:4,lacticidven:4) )No results found for this or any previous visit (from the past 240 hour(s)).   Radiological Exams on Admission: Dg Chest Port 1 View  Result Date: 10/14/2019 CLINICAL DATA:  Cough, fever EXAM: PORTABLE CHEST 1 VIEW COMPARISON:  Chest radiograph dated 12/01/2018 FINDINGS: The heart is enlarged. Vascular calcifications are seen in the aortic arch. Bibasilar interstitial and airspace opacities are noted. There is no pleural effusion or pneumothorax. The osseous structures are intact. IMPRESSION: 1. Bibasilar interstitial and airspace opacities. This likely represents a combination of underlying chronic interstitial lung disease with superimposed aspiration/pneumonia. 2. Cardiomegaly. Aortic Atherosclerosis (ICD10-I70.0). Electronically Signed   By: Zerita Boers M.D.   On: 10/14/2019 18:14      Assessment/Plan Principal Problem:   Pneumonia due to COVID-19 virus Active Problems:   Hypertension   Hypercholesteremia   Cognitive impairment   Hyponatremia     #1 pneumonia due to COVID-19 virus: Patient has new oxygen requirement of 2 L/min.  We will admit the patient for inpatient care.  Initiate remdesivir and Decadron.  Azithromycin and Rocephin for possible secondary pneumonia.  IV fluid and supportive care.  Monitor his oxygen level and titrate as necessary.  #2 history of alcoholism with memory impairment: Stable.  In an assisted living facility.  No recent alcohol intake reportedly.  #3  hyponatremia: Mild.  Probably dehydration.  Hydrate with saline and follow closely.  #4 hyperlipidemia: Continue with statin  #5 essential hypertension: Blood pressure appears controlled at this point.  Continue home regimen   DVT prophylaxis: Low Code Status: DNR Family Communication: No family at bedside Disposition  Plan: Back to assisted living Consults called: None Admission status: Inpatient  Severity of Illness: The appropriate patient status for this patient is INPATIENT. Inpatient status is judged to be reasonable and necessary in order to provide the required intensity of service to ensure the patient's safety. The patient's presenting symptoms, physical exam findings, and initial radiographic and laboratory data in the context of their chronic comorbidities is felt to place them at high risk for further clinical deterioration. Furthermore, it is not anticipated that the patient will be medically stable for discharge from the hospital within 2 midnights of admission. The following factors support the patient status of inpatient.   " The patient's presenting symptoms include fever shortness of breath. " The worrisome physical exam findings include coarse breath sound bilaterally. " The initial radiographic and laboratory data are worrisome because of evidence of pneumonia. " The chronic co-morbidities include hypertension.   * I certify that at the point of admission it is my clinical judgment that the patient will require inpatient hospital care spanning beyond 2 midnights from the point of admission due to high intensity of service, high risk for further deterioration and high frequency of surveillance required.Barbette Merino MD [facility] Pager [phone]  If 7PM-7AM, please contact night-coverage [email or web address] Password [facility]  10/14/2019, 8:32 PM

## 2019-10-15 ENCOUNTER — Encounter (HOSPITAL_COMMUNITY): Payer: Self-pay

## 2019-10-15 LAB — COMPREHENSIVE METABOLIC PANEL
ALT: 11 U/L (ref 0–44)
AST: 21 U/L (ref 15–41)
Albumin: 3.2 g/dL — ABNORMAL LOW (ref 3.5–5.0)
Alkaline Phosphatase: 36 U/L — ABNORMAL LOW (ref 38–126)
Anion gap: 7 (ref 5–15)
BUN: 26 mg/dL — ABNORMAL HIGH (ref 8–23)
CO2: 22 mmol/L (ref 22–32)
Calcium: 8.2 mg/dL — ABNORMAL LOW (ref 8.9–10.3)
Chloride: 105 mmol/L (ref 98–111)
Creatinine, Ser: 1.45 mg/dL — ABNORMAL HIGH (ref 0.61–1.24)
GFR calc Af Amer: 49 mL/min — ABNORMAL LOW (ref >60)
GFR calc non Af Amer: 43 mL/min — ABNORMAL LOW (ref >60)
Glucose, Bld: 123 mg/dL — ABNORMAL HIGH (ref 70–99)
Potassium: 4.4 mmol/L (ref 3.5–5.1)
Sodium: 134 mmol/L — ABNORMAL LOW (ref 135–145)
Total Bilirubin: 0.4 mg/dL (ref 0.3–1.2)
Total Protein: 6 g/dL — ABNORMAL LOW (ref 6.5–8.1)

## 2019-10-15 LAB — CBC WITH DIFFERENTIAL/PLATELET
Abs Immature Granulocytes: 0.02 10*3/uL (ref 0.00–0.07)
Basophils Absolute: 0 10*3/uL (ref 0.0–0.1)
Basophils Relative: 0 %
Eosinophils Absolute: 0 10*3/uL (ref 0.0–0.5)
Eosinophils Relative: 0 %
HCT: 42.1 % (ref 39.0–52.0)
Hemoglobin: 13.4 g/dL (ref 13.0–17.0)
Immature Granulocytes: 1 %
Lymphocytes Relative: 15 %
Lymphs Abs: 0.5 10*3/uL — ABNORMAL LOW (ref 0.7–4.0)
MCH: 29.6 pg (ref 26.0–34.0)
MCHC: 31.8 g/dL (ref 30.0–36.0)
MCV: 92.9 fL (ref 80.0–100.0)
Monocytes Absolute: 0.2 10*3/uL (ref 0.1–1.0)
Monocytes Relative: 7 %
Neutro Abs: 2.6 10*3/uL (ref 1.7–7.7)
Neutrophils Relative %: 77 %
Platelets: 120 10*3/uL — ABNORMAL LOW (ref 150–400)
RBC: 4.53 MIL/uL (ref 4.22–5.81)
RDW: 13.6 % (ref 11.5–15.5)
WBC: 3.4 10*3/uL — ABNORMAL LOW (ref 4.0–10.5)
nRBC: 0 % (ref 0.0–0.2)

## 2019-10-15 LAB — FERRITIN: Ferritin: 206 ng/mL (ref 24–336)

## 2019-10-15 LAB — MAGNESIUM: Magnesium: 1.8 mg/dL (ref 1.7–2.4)

## 2019-10-15 LAB — C-REACTIVE PROTEIN: CRP: 3.4 mg/dL — ABNORMAL HIGH (ref <1.0)

## 2019-10-15 LAB — ABO/RH: ABO/RH(D): O POS

## 2019-10-15 NOTE — Progress Notes (Signed)
PROGRESS NOTE    Victor Clarke  [id number] DOB: [birth year] DOA: 10/14/2019 PCP: Sherald Hess., MD    Brief Narrative:  85 year old with peripheral neuropathy, copd, hypertension, hyperlipidemia from ALF sent to the emergency room with fever cough and recently tested COVID-19.  Patient's only complaint was lightheadedness and fatigue. In the emergency room, he was hemodynamically stable.  His creatinine was slightly elevated from baseline.  Chest x-ray shows bilateral infiltrates.  COVID-19 test was positive.  Emergency room report states that he was 88% on ambulation.  He has mostly remained on room air.  Assessment & Plan:   Principal Problem:   Pneumonia due to COVID-19 virus Active Problems:   Hypertension   Hypercholesteremia   Cognitive impairment   Hyponatremia  Pneumonia due to COVID-19 virus with hypoxia: Also rule out aspiration pneumonia.  Reasonable to continue Rocephin and azithromycin due to atypical distribution of infiltrates on chest x-ray.  Speech therapy evaluation.  Continue antibiotics. Oxygen to keep saturation more than 90%. Patient is on dexamethasone, will continue for 10 days. Remdesivir for 5 days. Daily inflammatory biomarkers and monitoring. Cough medications, breathing exercises. PT OT.  Acute kidney injury: Probably due to above.  Maintenance IV fluids.  Recheck levels tomorrow morning to ensure stabilization.  ACE inhibitor on hold.  Hypertension: Blood pressures are fairly stable.  ACE inhibitor is on hold due to abnormal renal functions.  Will resume once stable.  Cognitive impairment with early dementia, questionable Parkinson's disease: Lives at ALF.  Is on multiple medications including benzodiazepines, Lexapro that he will continue.   DVT prophylaxis: Lovenox subcu Code Status: DNR Family Communication: Ms. Tobie Poet, patient's close friend and local guardian.  She was worried about him going back to same assisted living facility.  Disposition Plan: Back to ALF.  Anticipate next 24 to 48 hours.   Consultants:   None  Procedures:   None  Antimicrobials:   Rocephin, azithromycin: 10/14/2019----   Subjective: Patient seen and examined.  No overnight events.  Remains afebrile.  Mostly on room air.  On my interview, he was just asking for a diaper.  Objective: Vitals:   10/14/19 2330 10/15/19 0032 10/15/19 0521 10/15/19 1209  BP: (!) 111/58 119/80 117/62 106/62  Pulse: 65 61 (!) 57 65  Resp: 17 14 19    Temp:  97.9 F (36.6 C) 98.3 F (36.8 C) (!) 97.5 F (36.4 C)  TempSrc:  Oral Oral Oral  SpO2: 95% 100% 96% 97%    Intake/Output Summary (Last 24 hours) at 10/15/2019 1454 Last data filed at 10/15/2019 1400 Gross per 24 hour  Intake 1717.97 ml  Output -  Net 1717.97 ml   There were no vitals filed for this visit.  Examination:  General exam: Appears calm and comfortable, chronically sick looking.  Not in any distress. Respiratory system: Clear to auscultation. Respiratory effort normal.  Mostly on room air. Cardiovascular system: S1 & S2 heard, RRR. No JVD, murmurs, rubs, gallops or clicks. No pedal edema. Gastrointestinal system: Abdomen is nondistended, soft and nontender. No organomegaly or masses felt. Normal bowel sounds heard. Central nervous system: Alert and mostly oriented.  May have some cognitive delay.  No focal neurological deficits. Extremities: Symmetric 5 x 5 power. Skin: No rashes, lesions or ulcers Psychiatry: Judgement and insight appear normal. Mood & affect appropriate.     Data Reviewed: I have personally reviewed following labs and imaging studies  CBC: Recent Labs  Lab 10/14/19 1756 10/15/19 0348  WBC 3.1*  3.4*  NEUTROABS 2.1 2.6  HGB 13.8 13.4  HCT 41.7 42.1  MCV 91.4 92.9  PLT 135* [id number]*   Basic Metabolic Panel: Recent Labs  Lab 10/14/19 1756 10/15/19 0348  NA 133* 134*  K 4.8 4.4  CL 102 105  CO2 23 22  GLUCOSE 118* 123*  BUN 26* 26*  CREATININE  1.72* 1.45*  CALCIUM 8.5* 8.2*  MG  --  1.8   GFR: CrCl cannot be calculated (Unknown ideal weight.). Liver Function Tests: Recent Labs  Lab 10/14/19 2052 10/15/19 0348  AST 25 21  ALT 14 11  ALKPHOS 39 36*  BILITOT 0.9 0.4  PROT 6.9 6.0*  ALBUMIN 3.7 3.2*   No results for input(s): LIPASE, AMYLASE in the last 168 hours. No results for input(s): AMMONIA in the last 168 hours. Coagulation Profile: No results for input(s): INR, PROTIME in the last 168 hours. Cardiac Enzymes: No results for input(s): CKTOTAL, CKMB, CKMBINDEX, TROPONINI in the last 168 hours. BNP (last 3 results) No results for input(s): PROBNP in the last 8760 hours. HbA1C: No results for input(s): HGBA1C in the last 72 hours. CBG: No results for input(s): GLUCAP in the last 168 hours. Lipid Profile: Recent Labs    10/14/19 2013  TRIG 77   Thyroid Function Tests: No results for input(s): TSH, T4TOTAL, FREET4, T3FREE, THYROIDAB in the last 72 hours. Anemia Panel: Recent Labs    10/14/19 2013 10/15/19 0348  FERRITIN 221 206   Sepsis Labs: Recent Labs  Lab 10/14/19 2013 10/14/19 2213  PROCALCITON <0.10  --   LATICACIDVEN 1.1 0.9    Recent Results (from the past 240 hour(s))  Blood Culture (routine x 2)     Status: None (Preliminary result)   Collection Time: 10/14/19  8:13 PM   Specimen: BLOOD  Result Value Ref Range Status   Specimen Description   Final    BLOOD LEFT ANTECUBITAL Performed at [facility], [place] [address] [place], [place] [address]    Special Requests   Final    BOTTLES DRAWN AEROBIC AND ANAEROBIC Blood Culture adequate volume Performed at [facility] [address], [place], [place] [address]    Culture   Final    NO GROWTH < 24 HOURS Performed at [facility] [address], [place], [place] [address]    Report Status PENDING  Incomplete  Blood Culture (routine x 2)     Status: None (Preliminary result)   Collection  Time: 10/14/19  8:18 PM   Specimen: BLOOD LEFT FOREARM  Result Value Ref Range Status   Specimen Description   Final    BLOOD LEFT FOREARM Performed at [facility], [place] [address], [place], [place] [address]    Special Requests   Final    BOTTLES DRAWN AEROBIC AND ANAEROBIC Blood Culture results may not be optimal due to an excessive volume of blood received in culture bottles Performed at [facility] [address], [place], [place] [address]    Culture   Final    NO GROWTH < 24 HOURS Performed at [facility] [address], [place],  [address]    Report Status PENDING  Incomplete         Radiology Studies: Dg Chest Port 1 View  Result Date: 10/14/2019 CLINICAL DATA:  Cough, fever EXAM: PORTABLE CHEST 1 VIEW COMPARISON:  Chest radiograph dated 12/01/2018 FINDINGS: The heart is enlarged. Vascular calcifications are seen in the  aortic arch. Bibasilar interstitial and airspace opacities are noted. There is no pleural effusion or pneumothorax. The osseous structures are intact. IMPRESSION: 1. Bibasilar interstitial and airspace opacities. This likely represents a combination of underlying chronic interstitial lung disease with superimposed aspiration/pneumonia. 2. Cardiomegaly. Aortic Atherosclerosis (ICD10-I70.0). Electronically Signed   By: Zerita Boers M.D.   On: 10/14/2019 18:14        Scheduled Meds: . albuterol  2 puff Inhalation Q6H  . ALPRAZolam  0.125 mg Oral BID  . aspirin EC  81 mg Oral Q M,W,F  . cholecalciferol  1,000 Units Oral Daily  . dexamethasone (DECADRON) injection  6 mg Intravenous Q24H  . enoxaparin (LOVENOX) injection  40 mg Subcutaneous Daily  . escitalopram  10 mg Oral Daily  . magnesium oxide  400 mg Oral Daily  . multivitamin with minerals  1 tablet Oral Daily  . rosuvastatin  10 mg Oral QHS   Continuous Infusions: . sodium chloride 100 mL/hr at 10/15/19 1400  . azithromycin 500 mg (10/15/19 0309)  .  cefTRIAXone (ROCEPHIN)  IV 1 g (10/15/19 0223)  . remdesivir 100 mg in NS 250 mL       LOS: 1 day    Time spent: 30 minutes    Barb Merino, MD [facility] Pager [phone]

## 2019-10-15 NOTE — Evaluation (Signed)
Physical Therapy Evaluation Patient Details Name: Victor Clarke MRN: [id number] DOB: [birth year] Today's Date: 10/15/2019   History of Present Illness  85 year old man admitted with fever/cough and dx'd with pna 2* COVID  PMH:  Parkinson's, Etoh, and COPD  Clinical Impression  DERION HOLLIFIELD is 85 y.o. male admitted with above HPI and diagnosis. Patient is currently limited by functional impairments below (see PT problem list). Unsure of patient's PLOF and current residence as he provided contradictory history throughout session. Per chart review pt is from ALF. He currently requires min guard/assist for tranfers and gait with cues for safety. Patient will benefit from continued skilled PT interventions to address impairments and progress independence with mobility, recommending SNF with follow up PT. Acute PT will follow and progress as able.    Follow Up Recommendations SNF    Equipment Recommendations  None recommended by PT    Recommendations for Other Services       Precautions / Restrictions Precautions Precautions: Fall Restrictions Weight Bearing Restrictions: No      Mobility  Bed Mobility Overal bed mobility: Needs Assistance Bed Mobility: Supine to Sit     Supine to sit: HOB elevated;Min guard     General bed mobility comments: cues to bring LE's to EOB and feet on floor  Transfers Overall transfer level: Needs assistance Equipment used: None Transfers: Sit to/from Stand Sit to Stand: Min assist         General transfer comment: assist required to steady upon rising, pt able to perform power up independently  Ambulation/Gait Ambulation/Gait assistance: Min assist;Min guard Gait Distance (Feet): 40 Feet Assistive device: Rolling walker (2 wheeled) Gait Pattern/deviations: Step-through pattern;Decreased stride length;Narrow base of support Gait velocity: pt unsafe trying to move quickly, cues needed to decrease gait speed for safety   General Gait  Details: pt unsteady with slight staggering throughout. Pt with decreased safety awareness attempting to move quickly without awareness of IV line location. Cues requried to safely sequence turns and slow down. Pt unsteady with forward and backward gait with intermittent min assist to steady. (pt saturating between 91-96% SpO2 on RA during session)  Stairs            Wheelchair Mobility    Modified Rankin (Stroke Patients Only)       Balance Overall balance assessment: Needs assistance Sitting-balance support: Feet supported Sitting balance-Leahy Scale: Good     Standing balance support: During functional activity;No upper extremity supported Standing balance-Leahy Scale: Fair                Pertinent Vitals/Pain Pain Assessment: No/denies pain    Home Living Family/patient expects to be discharged to:: Assisted living Living Arrangements: Alone   Type of Home: House Home Access: Stairs to enter Entrance Stairs-Rails: None(chart review indicates Rt and Lt hand rail) Entrance Stairs-Number of Steps: 3 Home Layout: One level Home Equipment: Cane - single point;Shower seat - built in Additional Comments: per chart, Pt is from ALF [employer] [facility]).  Hx he gave was contradictory with information about living in his own home and then stating he has to renew his lease with [employer]. Pt also denied falls over the last year and then reported 4+ falls.    Prior Function Level of Independence: Independent with assistive device(s)         Comments: pt reported that he ambulates with/without SPC, he states he just carries it with him     Hand Dominance  Extremity/Trunk Assessment   Upper Extremity Assessment Upper Extremity Assessment: Defer to OT evaluation    Lower Extremity Assessment Lower Extremity Assessment: Overall WFL for tasks assessed    Cervical / Trunk Assessment Cervical / Trunk Assessment: Normal  Communication   Communication: No  difficulties  Cognition Arousal/Alertness: Awake/alert Behavior During Therapy: WFL for tasks assessed/performed Overall Cognitive Status: No family/caregiver present to determine baseline cognitive functioning            General Comments: decreased memory; cues for safety      General Comments      Exercises     Assessment/Plan    PT Assessment Patient needs continued PT services  PT Problem List Decreased strength;Decreased mobility;Decreased balance;Decreased knowledge of use of DME;Decreased activity tolerance       PT Treatment Interventions DME instruction;Balance training;Functional mobility training;Patient/family education;Therapeutic activities;Gait training;Stair training;Therapeutic exercise    PT Goals (Current goals can be found in the Care Plan section)  Acute Rehab PT Goals Patient Stated Goal: none stated PT Goal Formulation: With patient Time For Goal Achievement: 10/15/19 Potential to Achieve Goals: Fair    Frequency Min 2X/week    AM-PAC PT "6 Clicks" Mobility  Outcome Measure Help needed turning from your back to your side while in a flat bed without using bedrails?: A Little Help needed moving from lying on your back to sitting on the side of a flat bed without using bedrails?: A Little Help needed moving to and from a bed to a chair (including a wheelchair)?: A Little Help needed standing up from a chair using your arms (e.g., wheelchair or bedside chair)?: A Little Help needed to walk in hospital room?: A Little Help needed climbing 3-5 steps with a railing? : A Lot 6 Click Score: 17    End of Session Equipment Utilized During Treatment: Gait belt Activity Tolerance: Patient tolerated treatment well Patient left: with call bell/phone within reach;in chair;with chair alarm set Nurse Communication: Mobility status PT Visit Diagnosis: Muscle weakness (generalized) (M62.81);Difficulty in walking, not elsewhere classified (R26.2)    Time:  [id number] PT Time Calculation (min) (ACUTE ONLY): 41 min   Charges:   PT Evaluation $PT Eval Moderate Complexity: 1 Mod         Kipp Brood, PT, DPT Physical Therapist with [facility]  10/15/2019 4:04 PM

## 2019-10-15 NOTE — Progress Notes (Signed)
PHARMACY - PHYSICIAN COMMUNICATION CRITICAL VALUE ALERT - BLOOD CULTURE IDENTIFICATION (BCID)  Victor Clarke is an 85 y.o. male who presented to [facility] on 10/14/2019 with a chief complaint of PNA, Covid +  Assessment: 1 of 4 bottles with GPC (anaerobic bottle), consider contaminant  Name of physician (or Provider) Contacted: Dr Barb Merino  Current antibiotics: Rocephin & Azithromycin  Changes to prescribed antibiotics recommended: none  No results found for this or any previous visit.  Minda Ditto PharmD 10/15/2019  6:53 PM

## 2019-10-15 NOTE — Evaluation (Signed)
Occupational Therapy Evaluation Patient Details Name: Victor Clarke MRN: [id number] DOB: 01/20/1929 Today's Date: 10/15/2019    History of Present Illness 85 year old man admitted with fever/cough and dx'd with pna 2* COVID  PMH:  Parkinson's, Etoh, and COPD   Clinical Impression   Pt was admitted for the above.  He is not a good historian; per chart, he was living in an ALF.  Unsure if he had any ADL assistance. He needs min A at this time. Will follow in acute setting with min guard to supervision level goals.     Follow Up Recommendations  SNF(vs ALF with HHOT and assist for adls/mobility)    Equipment Recommendations  (defer to next venue)    Recommendations for Other Services       Precautions / Restrictions Precautions Precautions: Fall Restrictions Weight Bearing Restrictions: No      Mobility Bed Mobility                  Transfers Overall transfer level: Needs assistance Equipment used: None Transfers: Sit to/from Stand Sit to Stand: Min assist         General transfer comment: steadying assist    Balance Overall balance assessment: Needs assistance   Sitting balance-Leahy Scale: Good       Standing balance-Leahy Scale: Fair                             ADL either performed or assessed with clinical judgement   ADL Overall ADL's : Needs assistance/impaired Eating/Feeding: Set up   Grooming: Minimal assistance   Upper Body Bathing: Set up   Lower Body Bathing: Minimal assistance   Upper Body Dressing : Minimal assistance   Lower Body Dressing: Minimal assistance   Toilet Transfer: Minimal assistance   Toileting- Clothing Manipulation and Hygiene: Minimal assistance         General ADL Comments: min A to gather adl items; pt with tremor when reaching to retrieve item from drawer. Steadying assist when walking     Vision         Perception     Praxis      Pertinent Vitals/Pain Pain Assessment: No/denies  pain(stiff from being in bed)     Hand Dominance     Extremity/Trunk Assessment Upper Extremity Assessment Upper Extremity Assessment: Overall WFL for tasks assessed(tremor present)           Communication Communication Communication: No difficulties   Cognition Arousal/Alertness: Awake/alert Behavior During Therapy: WFL for tasks assessed/performed Overall Cognitive Status: No family/caregiver present to determine baseline cognitive functioning                                 General Comments: decreased memory; cues for safety   General Comments       Exercises     Shoulder Instructions      Home Living Family/patient expects to be discharged to:: Assisted living Living Arrangements: Alone   Type of Home: House Home Access: Stairs to enter [employer] of Steps: 3 Entrance Stairs-Rails: None(past chart reveiw indicates both hand rails) Home Layout: One level     Bathroom Shower/Tub: Occupational psychologist: Standard Bathroom Accessibility: Yes   Home Equipment: Cane - single point;Shower seat - built in   Additional Comments: per chart, Pt is from ALF.  Hx he gave was contradictory  Prior Functioning/Environment Level of Independence: Independent with assistive device(s)        Comments: pt reported that he ambulates with use of a cane        OT Problem List: Decreased strength;Decreased activity tolerance;Impaired balance (sitting and/or standing);Decreased cognition;Decreased safety awareness      OT Treatment/Interventions: Self-care/ADL training;DME and/or AE instruction;Balance training;Patient/family education;Cognitive remediation/compensation;Therapeutic activities    OT Goals(Current goals can be found in the care plan section) Acute Rehab OT Goals Patient Stated Goal: none stated OT Goal Formulation: Patient unable to participate in goal setting Time For Goal Achievement: 10/29/19 Potential to  Achieve Goals: Good ADL Goals Pt Will Perform Grooming: with supervision;standing Pt Will Transfer to Toilet: with min guard assist;ambulating;bedside commode Pt Will Perform Toileting - Clothing Manipulation and hygiene: sit to/from stand;with supervision Additional ADL Goal #1: pt will gather clothes at min guard level and perform adl with supervision  OT Frequency: Min 2X/week   Barriers to D/C:            Co-evaluation              AM-PAC OT "6 Clicks" Daily Activity     Outcome Measure Help from another person eating meals?: A Little Help from another person taking care of personal grooming?: A Little Help from another person toileting, which includes using toliet, bedpan, or urinal?: A Little Help from another person bathing (including washing, rinsing, drying)?: A Little Help from another person to put on and taking off regular upper body clothing?: A Little Help from another person to put on and taking off regular lower body clothing?: A Little 6 Click Score: 18   End of Session    Activity Tolerance: Patient tolerated treatment well Patient left: in chair;with call bell/phone within reach;with bed alarm set  OT Visit Diagnosis: Unsteadiness on feet (R26.81)                Time: [id number] OT Time Calculation (min): 41 min Charges:  OT General Charges $OT Visit: 1 Visit OT Evaluation $OT Eval Low Complexity: Merna, OTR/L Acute Rehabilitation Services [phone] [facility] pager [phone] office 10/15/2019  Menan 10/15/2019, 3:38 PM

## 2019-10-16 LAB — CBC WITH DIFFERENTIAL/PLATELET
Abs Immature Granulocytes: 0.02 10*3/uL (ref 0.00–0.07)
Basophils Absolute: 0 10*3/uL (ref 0.0–0.1)
Basophils Relative: 0 %
Eosinophils Absolute: 0 10*3/uL (ref 0.0–0.5)
Eosinophils Relative: 0 %
HCT: 39.5 % (ref 39.0–52.0)
Hemoglobin: 12.6 g/dL — ABNORMAL LOW (ref 13.0–17.0)
Immature Granulocytes: 1 %
Lymphocytes Relative: 16 %
Lymphs Abs: 0.5 10*3/uL — ABNORMAL LOW (ref 0.7–4.0)
MCH: 30.2 pg (ref 26.0–34.0)
MCHC: 31.9 g/dL (ref 30.0–36.0)
MCV: 94.7 fL (ref 80.0–100.0)
Monocytes Absolute: 0.2 10*3/uL (ref 0.1–1.0)
Monocytes Relative: 6 %
Neutro Abs: 2.2 10*3/uL (ref 1.7–7.7)
Neutrophils Relative %: 77 %
Platelets: 122 10*3/uL — ABNORMAL LOW (ref 150–400)
RBC: 4.17 MIL/uL — ABNORMAL LOW (ref 4.22–5.81)
RDW: 13.6 % (ref 11.5–15.5)
WBC: 2.9 10*3/uL — ABNORMAL LOW (ref 4.0–10.5)
nRBC: 0 % (ref 0.0–0.2)

## 2019-10-16 LAB — C-REACTIVE PROTEIN: CRP: 1.9 mg/dL — ABNORMAL HIGH (ref <1.0)

## 2019-10-16 LAB — COMPREHENSIVE METABOLIC PANEL
ALT: 14 U/L (ref 0–44)
AST: 26 U/L (ref 15–41)
Albumin: 2.7 g/dL — ABNORMAL LOW (ref 3.5–5.0)
Alkaline Phosphatase: 30 U/L — ABNORMAL LOW (ref 38–126)
Anion gap: 9 (ref 5–15)
BUN: 29 mg/dL — ABNORMAL HIGH (ref 8–23)
CO2: 16 mmol/L — ABNORMAL LOW (ref 22–32)
Calcium: 8 mg/dL — ABNORMAL LOW (ref 8.9–10.3)
Chloride: 109 mmol/L (ref 98–111)
Creatinine, Ser: 1.22 mg/dL (ref 0.61–1.24)
GFR calc Af Amer: >60 mL/min (ref >60)
GFR calc non Af Amer: 53 mL/min — ABNORMAL LOW (ref >60)
Glucose, Bld: 159 mg/dL — ABNORMAL HIGH (ref 70–99)
Potassium: 4.7 mmol/L (ref 3.5–5.1)
Sodium: 134 mmol/L — ABNORMAL LOW (ref 135–145)
Total Bilirubin: 0.4 mg/dL (ref 0.3–1.2)
Total Protein: 5.3 g/dL — ABNORMAL LOW (ref 6.5–8.1)

## 2019-10-16 LAB — MAGNESIUM: Magnesium: 1.8 mg/dL (ref 1.7–2.4)

## 2019-10-16 LAB — MRSA PCR SCREENING: MRSA by PCR: NEGATIVE

## 2019-10-16 LAB — FERRITIN: Ferritin: 299 ng/mL (ref 24–336)

## 2019-10-16 MED ORDER — ENSURE ENLIVE PO LIQD
237.0000 mL | Freq: Three times a day (TID) | ORAL | Status: DC
  Administered 2019-10-16 – 2019-10-26 (×27): 237 mL via ORAL

## 2019-10-16 MED ORDER — ALBUTEROL SULFATE HFA 108 (90 BASE) MCG/ACT IN AERS
2.0000 | INHALATION_SPRAY | Freq: Three times a day (TID) | RESPIRATORY_TRACT | Status: DC
  Administered 2019-10-16 – 2019-10-18 (×8): 2 via RESPIRATORY_TRACT

## 2019-10-16 MED ORDER — LIP MEDEX EX OINT
TOPICAL_OINTMENT | CUTANEOUS | Status: DC | PRN
  Filled 2019-10-16: qty 7

## 2019-10-16 NOTE — Plan of Care (Signed)
Patient lying in bed this morning; drowsy but arousable. No needs expressed; pain controlled. Will continue to monitor.

## 2019-10-16 NOTE — Evaluation (Signed)
Clinical/Bedside Swallow Evaluation Patient Details  Name: Victor Clarke MRN: [id number] Date of Birth: [birth year]  Today's Date: 10/16/2019 Time: SLP Start Time (ACUTE ONLY): 1515 SLP Stop Time (ACUTE ONLY): 1549 SLP Time Calculation (min) (ACUTE ONLY): 34 min  Past Medical History:  Past Medical History:  Diagnosis Date  . Alcoholic peripheral neuropathy ([place])   . Allergy   . Anemia   . Arthritis   . Cataract    left eye cataract removed  . COPD (chronic obstructive pulmonary disease) ([place])    pt denies 02-03-17  . Dehydration   . Detached retina    a. s/p surgical correction on the right.  . Ectopic atrial tachycardia ([place])    a. 08/2014 Echo: EF 55-60%, mildly dil LA.  Marland Kitchen EtOH dependence ([place])   . Fall   . Falls frequently 11/2018  . Gout   . Gout   . Hypercholesteremia   . Hypertension   . Left inguinal hernia    a. s/p repair ~ 10 yrs ago.  . Malnutrition ([place])   . Orthostatic hypotension   . Parkinson's disease ([place])   . Prostate cancer ([place])    a. s/p TURP.   Past Surgical History:  Past Surgical History:  Procedure Laterality Date  . CATARACT EXTRACTION     on eye, possibly right eye  . COLONOSCOPY    . COLONOSCOPY W/ POLYPECTOMY  2001, 2006  . HERNIA REPAIR    . PROSTATE SURGERY     HPI:  85 yo adm to [facility] with pna, COPD, ETOH, fever, Parkinson's with prior h/o smoking 41 years Pt on Benzo's priro to admit and lives in ALF.   Assessment / Plan / Recommendation Clinical Impression  Pt without indication of dysphagia or aspiration.  His CN exam is unremarkable and his dentition intact.  Pt able to feed himself - Consumption of thin water and 4 ounces of pudding clinically with functional clearance.  Pt admits to issues with coughing up secretions in the morning -.  He denies Parkinson's and states it keeps following him on his record.  Pt's voice is strong but cough is mildly weak volitional.  He admits to poor appetite but likes ensure vanilla. Advised RN  to findings, pt's enjoyment of Ensure. SLP Visit Diagnosis: Dysphagia, oropharyngeal phase (R13.12)    Aspiration Risk       Diet Recommendation Regular;Thin liquid   Liquid Administration via: Cup;Straw Medication Administration: Whole meds with liquid Supervision: Patient able to self feed Compensations: Slow rate;Small sips/bites    Other  Recommendations Oral Care Recommendations: Oral care BID   Follow up Recommendations None      Frequency and Duration     n/a       Prognosis    n/a    Swallow Study   General Date of Onset: 10/16/19 HPI: 85 yo adm to [facility] with pna, COPD, ETOH, fever, Parkinson's with prior h/o smoking 41 years Pt on Benzo's priro to admit and lives in ALF. Type of Study: Bedside Swallow Evaluation Diet Prior to this Study: Thin liquids;Regular Temperature Spikes Noted: No Respiratory Status: Room air History of Recent Intubation: No Behavior/Cognition: Alert;Cooperative;Pleasant mood Oral Cavity Assessment: Within Functional Limits Oral Care Completed by SLP: No Oral Cavity - Dentition: Adequate natural dentition Vision: Functional for self-feeding Self-Feeding Abilities: Able to feed self Patient Positioning: Upright in bed Baseline Vocal Quality: Normal Volitional Cough: Strong Volitional Swallow: Able to elicit    Oral/Motor/Sensory Function Overall Oral Motor/Sensory Function: Within functional  limits   Ice Chips Ice chips: Not tested   Thin Liquid Thin Liquid: Within functional limits Presentation: Self Fed;Straw    Nectar Thick Nectar Thick Liquid: Not tested   Honey Thick Honey Thick Liquid: Not tested   Puree Puree: Within functional limits Presentation: Self Fed;Spoon   Solid     Solid: Not tested Other Comments: pt politely declined to consume solids , stating he has no appetite      Victor Clarke 10/16/2019,4:05 PM  Victor Clarke, [facility] SLP Acute Rehab Services Pager [phone] Office [phone]

## 2019-10-16 NOTE — Progress Notes (Signed)
PROGRESS NOTE    Victor Clarke  [id number] DOB: 1929-08-05 DOA: 10/14/2019 PCP: Sherald Hess., MD    Brief Narrative:  85 year old with peripheral neuropathy, copd, hypertension, hyperlipidemia from ALF sent to the emergency room with fever, cough and recently tested COVID-19.  Patient's only complaint was lightheadedness and fatigue. In the emergency room, he was hemodynamically stable.  His creatinine was slightly elevated from baseline.  Chest x-ray showed bilateral infiltrates.  COVID-19 test was positive.  Emergency room report states that he was 88% on ambulation.  He has mostly remained on room air.  Assessment & Plan:   Principal Problem:   Pneumonia due to COVID-19 virus Active Problems:   Hypertension   Hypercholesteremia   Cognitive impairment   Hyponatremia  Pneumonia due to COVID-19 virus with hypoxia: Also rule out aspiration pneumonia. continue Rocephin and azithromycin due to atypical distribution of infiltrates on chest x-ray.  Speech therapy evaluation.  Continue antibiotics. Oxygen to keep saturation more than 90%. Patient is on dexamethasone, will continue for 10 days. Remdesivir for 2/5 days or until discharge from the hospital.  Daily inflammatory biomarkers and monitoring. Cough medications, breathing exercises. PT OT.  Acute kidney injury: Probably due to above.  Treated with IV fluids.  Improved.  ACE inhibitor on hold.  Hypertension: Blood pressures are fairly stable.  ACE inhibitor is on hold due to abnormal renal functions.  Will resume once stable.  Cognitive impairment with early dementia, questionable Parkinson's disease: Lives at ALF.  Is on multiple medications including benzodiazepines, Lexapro that he will continue. Work with PT OT.  Will refer to skilled nursing facility.   DVT prophylaxis: Lovenox subcu Code Status: DNR Family Communication: Ms. Tobie Poet, patient's close friend and local guardian.  Disposition Plan: SNF when bed is  available.   Consultants:   None  Procedures:   None  Antimicrobials:   Rocephin, azithromycin: 10/14/2019----   Subjective:  patient seen and examined. Afebrile .  No other overnight events.  Patient himself denies any complaints. "I could only take few steps, I still has some dizziness on getting up"  Objective: Vitals:   10/15/19 1209 10/15/19 2028 10/16/19 0515 10/16/19 1147  BP: 106/62 104/60 (!) 110/59 (!) 110/59  Pulse: 65 64 (!) 59 (!) 59  Resp:  16 17 17   Temp: (!) 97.5 F (36.4 C) 98.9 F (37.2 C) 97.9 F (36.6 C) 97.9 F (36.6 C)  TempSrc: Oral Oral Oral Oral  SpO2: 97% 95% 94%   Weight:    73.2 kg  Height:    5\' 6"  (1.676 m)    Intake/Output Summary (Last 24 hours) at 10/16/2019 1256 Last data filed at 10/16/2019 0900 Gross per 24 hour  Intake 1544.64 ml  Output 775 ml  Net 769.64 ml   Filed Weights   10/16/19 1147  Weight: 73.2 kg    Examination:  General exam: Appears calm and comfortable, chronically sick looking.  Not in any distress. Respiratory system: Clear to auscultation. Respiratory effort normal.  Mostly on room air. Cardiovascular system: S1 & S2 heard, RRR. No JVD, murmurs, rubs, gallops or clicks. No pedal edema. Gastrointestinal system: Abdomen is nondistended, soft and nontender. No organomegaly or masses felt. Normal bowel sounds heard. Central nervous system: Alert and mostly oriented.  May have some cognitive delay.  No focal neurological deficits. Extremities: Symmetric 5 x 5 power. Skin: No rashes, lesions or ulcers Psychiatry: Judgement and insight appear normal. Mood & affect appropriate.  Data Reviewed: I have personally reviewed following labs and imaging studies  CBC: Recent Labs  Lab 10/14/19 1756 10/15/19 0348 10/16/19 0345  WBC 3.1* 3.4* 2.9*  NEUTROABS 2.1 2.6 2.2  HGB 13.8 13.4 12.6*  HCT 41.7 42.1 39.5  MCV 91.4 92.9 94.7  PLT 135* 120* 123XX123*   Basic Metabolic Panel: Recent Labs  Lab 10/14/19  1756 10/15/19 0348 10/16/19 0345  NA 133* 134* 134*  K 4.8 4.4 4.7  CL 102 105 109  CO2 23 22 16*  GLUCOSE 118* 123* 159*  BUN 26* 26* 29*  CREATININE 1.72* 1.45* 1.22  CALCIUM 8.5* 8.2* 8.0*  MG  --  1.8 1.8   GFR: Estimated Creatinine Clearance: 37.8 mL/min (by C-G formula based on SCr of 1.22 mg/dL). Liver Function Tests: Recent Labs  Lab 10/14/19 2052 10/15/19 0348 10/16/19 0345  AST 25 21 26   ALT 14 11 14   ALKPHOS 39 36* 30*  BILITOT 0.9 0.4 0.4  PROT 6.9 6.0* 5.3*  ALBUMIN 3.7 3.2* 2.7*   No results for input(s): LIPASE, AMYLASE in the last 168 hours. No results for input(s): AMMONIA in the last 168 hours. Coagulation Profile: No results for input(s): INR, PROTIME in the last 168 hours. Cardiac Enzymes: No results for input(s): CKTOTAL, CKMB, CKMBINDEX, TROPONINI in the last 168 hours. BNP (last 3 results) No results for input(s): PROBNP in the last 8760 hours. HbA1C: No results for input(s): HGBA1C in the last 72 hours. CBG: No results for input(s): GLUCAP in the last 168 hours. Lipid Profile: Recent Labs    10/14/19 2013  TRIG 77   Thyroid Function Tests: No results for input(s): TSH, T4TOTAL, FREET4, T3FREE, THYROIDAB in the last 72 hours. Anemia Panel: Recent Labs    10/15/19 0348 10/16/19 0345  FERRITIN 206 299   Sepsis Labs: Recent Labs  Lab 10/14/19 2013 10/14/19 2213  PROCALCITON <0.10  --   LATICACIDVEN 1.1 0.9    Recent Results (from the past 240 hour(s))  Blood Culture (routine x 2)     Status: None (Preliminary result)   Collection Time: 10/14/19  8:13 PM   Specimen: BLOOD  Result Value Ref Range Status   Specimen Description   Final    BLOOD LEFT ANTECUBITAL Performed at [facility], [place] [address], [place], [place] [address]    Special Requests   Final    BOTTLES DRAWN AEROBIC AND ANAEROBIC Blood Culture adequate volume Performed at [place] [address], [place], [place]  [address]    Culture  Setup Time   Final    IN BOTH AEROBIC AND ANAEROBIC BOTTLES GRAM POSITIVE COCCI CRITICAL RESULT CALLED TO, READ BACK BY AND VERIFIED WITH: T GREEN PHARMD 10/15/19 1753 JDW    Culture   Final    CULTURE REINCUBATED FOR BETTER GROWTH Performed at [facility], [place] [address] [place], [place] [address]    Report Status PENDING  Incomplete  Blood Culture (routine x 2)     Status: None (Preliminary result)   Collection Time: 10/14/19  8:18 PM   Specimen: BLOOD LEFT FOREARM  Result Value Ref Range Status   Specimen Description   Final    BLOOD LEFT FOREARM Performed at [facility], [place] [address] [place], [place] [address]    Special Requests   Final    BOTTLES DRAWN AEROBIC AND ANAEROBIC Blood Culture results may not be optimal due to an excessive volume of blood received in culture  bottles Performed at [place] [address], [place], [place] [address]    Culture   Final    NO GROWTH < 24 HOURS Performed at [place] [address] [place], [place] [address]    Report Status PENDING  Incomplete  MRSA PCR Screening     Status: None   Collection Time: 10/16/19  7:41 AM   Specimen: Nasal Mucosa; Nasopharyngeal  Result Value Ref Range Status   MRSA by PCR NEGATIVE NEGATIVE Final    Comment:        The GeneXpert MRSA Assay (FDA approved for NASAL specimens only), is one component of a comprehensive MRSA colonization surveillance program. It is not intended to diagnose MRSA infection nor to guide or monitor treatment for MRSA infections. Performed at [facility], [place] [address], [place], [place] [address]          Radiology Studies: Dg Chest Port 1 View  Result Date: 10/14/2019 CLINICAL DATA:  Cough, fever EXAM: PORTABLE CHEST 1 VIEW COMPARISON:  Chest radiograph dated 12/01/2018 FINDINGS: The heart is enlarged. Vascular calcifications are seen in the aortic arch. Bibasilar  interstitial and airspace opacities are noted. There is no pleural effusion or pneumothorax. The osseous structures are intact. IMPRESSION: 1. Bibasilar interstitial and airspace opacities. This likely represents a combination of underlying chronic interstitial lung disease with superimposed aspiration/pneumonia. 2. Cardiomegaly. Aortic Atherosclerosis (ICD10-I70.0). Electronically Signed   By: Zerita Boers M.D.   On: 10/14/2019 18:14        Scheduled Meds: . albuterol  2 puff Inhalation TID  . ALPRAZolam  0.125 mg Oral BID  . aspirin EC  81 mg Oral Q M,W,F  . cholecalciferol  1,000 Units Oral Daily  . dexamethasone (DECADRON) injection  6 mg Intravenous Q24H  . enoxaparin (LOVENOX) injection  40 mg Subcutaneous Daily  . escitalopram  10 mg Oral Daily  . magnesium oxide  400 mg Oral Daily  . multivitamin with minerals  1 tablet Oral Daily  . rosuvastatin  10 mg Oral QHS   Continuous Infusions: . sodium chloride 50 mL/hr at 10/16/19 0905  . azithromycin 500 mg (10/15/19 2327)  . cefTRIAXone (ROCEPHIN)  IV 1 g (10/15/19 2255)  . remdesivir 100 mg in NS 250 mL Stopped (10/15/19 1839)     LOS: 2 days    Time spent: 30 minutes    Barb Merino, MD [facility] Pager [phone]

## 2019-10-17 LAB — CBC WITH DIFFERENTIAL/PLATELET
Abs Immature Granulocytes: 0.03 10*3/uL (ref 0.00–0.07)
Basophils Absolute: 0 10*3/uL (ref 0.0–0.1)
Basophils Relative: 0 %
Eosinophils Absolute: 0 10*3/uL (ref 0.0–0.5)
Eosinophils Relative: 0 %
HCT: 40.1 % (ref 39.0–52.0)
Hemoglobin: 13.1 g/dL (ref 13.0–17.0)
Immature Granulocytes: 1 %
Lymphocytes Relative: 10 %
Lymphs Abs: 0.5 10*3/uL — ABNORMAL LOW (ref 0.7–4.0)
MCH: 30.3 pg (ref 26.0–34.0)
MCHC: 32.7 g/dL (ref 30.0–36.0)
MCV: 92.6 fL (ref 80.0–100.0)
Monocytes Absolute: 0.3 10*3/uL (ref 0.1–1.0)
Monocytes Relative: 5 %
Neutro Abs: 4.6 10*3/uL (ref 1.7–7.7)
Neutrophils Relative %: 84 %
Platelets: 147 10*3/uL — ABNORMAL LOW (ref 150–400)
RBC: 4.33 MIL/uL (ref 4.22–5.81)
RDW: 13.4 % (ref 11.5–15.5)
WBC: 5.4 10*3/uL (ref 4.0–10.5)
nRBC: 0 % (ref 0.0–0.2)

## 2019-10-17 LAB — COMPREHENSIVE METABOLIC PANEL
ALT: 17 U/L (ref 0–44)
AST: 33 U/L (ref 15–41)
Albumin: 2.7 g/dL — ABNORMAL LOW (ref 3.5–5.0)
Alkaline Phosphatase: 31 U/L — ABNORMAL LOW (ref 38–126)
Anion gap: 10 (ref 5–15)
BUN: 29 mg/dL — ABNORMAL HIGH (ref 8–23)
CO2: 19 mmol/L — ABNORMAL LOW (ref 22–32)
Calcium: 8.2 mg/dL — ABNORMAL LOW (ref 8.9–10.3)
Chloride: 107 mmol/L (ref 98–111)
Creatinine, Ser: 1.19 mg/dL (ref 0.61–1.24)
GFR calc Af Amer: >60 mL/min (ref >60)
GFR calc non Af Amer: 54 mL/min — ABNORMAL LOW (ref >60)
Glucose, Bld: 166 mg/dL — ABNORMAL HIGH (ref 70–99)
Potassium: 4.6 mmol/L (ref 3.5–5.1)
Sodium: 136 mmol/L (ref 135–145)
Total Bilirubin: 0.4 mg/dL (ref 0.3–1.2)
Total Protein: 5.4 g/dL — ABNORMAL LOW (ref 6.5–8.1)

## 2019-10-17 LAB — FERRITIN: Ferritin: 461 ng/mL — ABNORMAL HIGH (ref 24–336)

## 2019-10-17 LAB — C-REACTIVE PROTEIN: CRP: 0.9 mg/dL (ref <1.0)

## 2019-10-17 LAB — MAGNESIUM: Magnesium: 1.8 mg/dL (ref 1.7–2.4)

## 2019-10-17 MED ORDER — SODIUM CHLORIDE 0.9 % IV SOLN
INTRAVENOUS | Status: DC
  Administered 2019-10-17 – 2019-10-22 (×7): via INTRAVENOUS

## 2019-10-17 MED ORDER — HYDROCORTISONE 1 % EX LOTN
TOPICAL_LOTION | Freq: Two times a day (BID) | CUTANEOUS | Status: DC
  Administered 2019-10-17 – 2019-10-26 (×11): via TOPICAL
  Filled 2019-10-17: qty 118

## 2019-10-17 MED ORDER — DEXAMETHASONE 4 MG PO TABS
6.0000 mg | ORAL_TABLET | Freq: Every day | ORAL | Status: AC
  Administered 2019-10-17 – 2019-10-23 (×7): 6 mg via ORAL
  Filled 2019-10-17 (×7): qty 2

## 2019-10-17 MED ORDER — AZITHROMYCIN 250 MG PO TABS
500.0000 mg | ORAL_TABLET | Freq: Every day | ORAL | Status: DC
  Administered 2019-10-17 – 2019-10-18 (×2): 500 mg via ORAL
  Filled 2019-10-17 (×2): qty 2

## 2019-10-17 NOTE — Progress Notes (Signed)
OT Cancellation Note  Patient Details Name: ZAMIRE TIET MRN: [id number] DOB: 06-Jan-1929   Cancelled Treatment:    Reason Eval/Treat Not Completed: Medical issues which prohibited therapy.  RN just took orthostatics and pt was symptomatic. Will check back another day  Tarnesha Ulloa 10/17/2019, 3:18 PM  [name], OTR/L Acute Rehabilitation Services [phone] [facility] pager [phone] office 10/17/2019

## 2019-10-17 NOTE — Progress Notes (Addendum)
PROGRESS NOTE    Victor Clarke  [id number] DOB: 1929/06/08 DOA: 10/14/2019 PCP: Sherald Hess., MD    Brief Narrative:  85 year old with peripheral neuropathy, copd, hypertension, hyperlipidemia from ALF sent to the emergency room with fever, cough and recently tested COVID-19.  Patient's only complaint was lightheadedness, fatigue and lightheadedness. In the emergency room, he was hemodynamically stable.  His creatinine was slightly elevated from baseline.  Chest x-ray showed bilateral infiltrates.  COVID-19 test was positive.  Emergency room reported that he was 88% on ambulation.  He has mostly remained on room air.  Assessment & Plan:   Principal Problem:   Pneumonia due to COVID-19 virus Active Problems:   Hypertension   Hypercholesteremia   Cognitive impairment   Hyponatremia  Pneumonia due to COVID-19 virus with hypoxia: Also suspect bacterial pneumonia.   Treating with Rocephin and azithromycin, day 4 out of 5.   Seen by speech therapy, no overt aspiration.  Oxygen to keep saturation more than 90%. Patient is on dexamethasone, will continue for 10 days.  Change to oral. Remdesivir day 3/5 days or until discharge from the hospital.  Adequately improving, discontinue daily labs. Cough medications, breathing exercises. PT OT. Will check orthostatic blood pressures.  Acute kidney injury: Probably due to above.  Treated with IV fluids.  Improved.  ACE inhibitor on hold.  Blood pressures are low normal.  Trying to avoid further IV fluids with Covid pneumonia.  His creatinine has normalized.  Hypertension: Blood pressures are fairly stable.  Low normal.  Check orthostatic.  ACE inhibitor were on hold due to abnormal renal functions.  He is not needing any antihypertensives.  Cognitive impairment with early dementia, questionable Parkinson's disease: Lives at ALF.  Is on multiple medications including benzodiazepines, Lexapro that he will continue.  Work with PT OT.   Referred to skilled nursing rehab before returning to assisted living place.   Patient had a friends number on the chart, patient gave me permission to talk to her so I updated her. Last night , her brother called back asked not to talk to her so her contact information is off the chart. Brother Hammack called, unable to reach Niece Adela Lank called and updated , discussed details about his care.  They live in [place]. Updated to them that patient will go to skilled nursing facility before going back to assisted living place. Family and patient agreeable.  Addendum: positive orthostatic 21 points systolic. Will hydrate with iv fluids overnight. Will check am cortisol.    DVT prophylaxis: Lovenox subcu Code Status: DNR Family Communication: patient's Niece  Disposition Plan: SNF when bed is available.   Consultants:   None  Procedures:   None  Antimicrobials:   Rocephin, azithromycin: 10/14/2019----   Subjective: Patient seen and examined.  No overnight events.  Afebrile overnight.  Poor historian.  Still feels dizzy when he gets up.  Objective: Vitals:   10/16/19 2135 10/17/19 0532 10/17/19 0917 10/17/19 1344  BP: (!) 121/54 (!) 108/48  (!) 95/54  Pulse: (!) 50 86  66  Resp: 20 18  20   Temp: 97.9 F (36.6 C) (!) 97.3 F (36.3 C)  97.7 F (36.5 C)  TempSrc: Oral Oral  Oral  SpO2: 94% (!) 88% 95% 94%  Weight:      Height:        Intake/Output Summary (Last 24 hours) at 10/17/2019 1348 Last data filed at 10/17/2019 0600 Gross per 24 hour  Intake 1190 ml  Output  500 ml  Net 690 ml   Filed Weights   10/16/19 1147  Weight: 73.2 kg    Examination:  General exam: Appears calm and comfortable, chronically sick looking.  Not in any distress. Respiratory system: Clear to auscultation. Respiratory effort normal.  Mostly on room air. Cardiovascular system: S1 & S2 heard, RRR. No JVD, murmurs, rubs, gallops or clicks. No pedal edema. Gastrointestinal system: Abdomen  is nondistended, soft and nontender. No organomegaly or masses felt. Normal bowel sounds heard. Central nervous system: Alert and mostly oriented.  May have some cognitive delay.  No focal neurological deficits. Extremities: Symmetric 5 x 5 power. Skin: No rashes, lesions or ulcers Psychiatry: Judgement and insight appear normal. Mood & affect appropriate.     Data Reviewed: I have personally reviewed following labs and imaging studies  CBC: Recent Labs  Lab 10/14/19 1756 10/15/19 0348 10/16/19 0345 10/17/19 0432  WBC 3.1* 3.4* 2.9* 5.4  NEUTROABS 2.1 2.6 2.2 4.6  HGB 13.8 13.4 12.6* 13.1  HCT 41.7 42.1 39.5 40.1  MCV 91.4 92.9 94.7 92.6  PLT 135* 120* 122* [id number]*   Basic Metabolic Panel: Recent Labs  Lab 10/14/19 1756 10/15/19 0348 10/16/19 0345 10/17/19 0432  NA 133* 134* 134* 136  K 4.8 4.4 4.7 4.6  CL 102 105 109 107  CO2 23 22 16* 19*  GLUCOSE 118* 123* 159* 166*  BUN 26* 26* 29* 29*  CREATININE 1.72* 1.45* 1.22 1.19  CALCIUM 8.5* 8.2* 8.0* 8.2*  MG  --  1.8 1.8 1.8   GFR: Estimated Creatinine Clearance: 38.7 mL/min (by C-G formula based on SCr of 1.19 mg/dL). Liver Function Tests: Recent Labs  Lab 10/14/19 2052 10/15/19 0348 10/16/19 0345 10/17/19 0432  AST 25 21 26  33  ALT 14 11 14 17   ALKPHOS 39 36* 30* 31*  BILITOT 0.9 0.4 0.4 0.4  PROT 6.9 6.0* 5.3* 5.4*  ALBUMIN 3.7 3.2* 2.7* 2.7*   No results for input(s): LIPASE, AMYLASE in the last 168 hours. No results for input(s): AMMONIA in the last 168 hours. Coagulation Profile: No results for input(s): INR, PROTIME in the last 168 hours. Cardiac Enzymes: No results for input(s): CKTOTAL, CKMB, CKMBINDEX, TROPONINI in the last 168 hours. BNP (last 3 results) No results for input(s): PROBNP in the last 8760 hours. HbA1C: No results for input(s): HGBA1C in the last 72 hours. CBG: No results for input(s): GLUCAP in the last 168 hours. Lipid Profile: Recent Labs    10/14/19 2013  TRIG 77    Thyroid Function Tests: No results for input(s): TSH, T4TOTAL, FREET4, T3FREE, THYROIDAB in the last 72 hours. Anemia Panel: Recent Labs    10/16/19 0345 10/17/19 0432  FERRITIN 299 461*   Sepsis Labs: Recent Labs  Lab 10/14/19 2013 10/14/19 2213  PROCALCITON <0.10  --   LATICACIDVEN 1.1 0.9    Recent Results (from the past 240 hour(s))  Blood Culture (routine x 2)     Status: Abnormal (Preliminary result)   Collection Time: 10/14/19  8:13 PM   Specimen: BLOOD  Result Value Ref Range Status   Specimen Description   Final    BLOOD LEFT ANTECUBITAL Performed at [facility], [place] [address], [place], [place] [address]    Special Requests   Final    BOTTLES DRAWN AEROBIC AND ANAEROBIC Blood Culture adequate volume Performed at [place] [address] [place], [place] [address]    Culture  Setup Time   Final  IN BOTH AEROBIC AND ANAEROBIC BOTTLES GRAM POSITIVE COCCI CRITICAL RESULT CALLED TO, READ BACK BY AND VERIFIED WITH: T GREEN PHARMD 10/15/19 1753 JDW    Culture (A)  Final    STAPHYLOCOCCUS SPECIES (COAGULASE NEGATIVE) THE SIGNIFICANCE OF ISOLATING THIS ORGANISM FROM A SINGLE SET OF BLOOD CULTURES WHEN MULTIPLE SETS ARE DRAWN IS UNCERTAIN. PLEASE NOTIFY THE MICROBIOLOGY DEPARTMENT WITHIN ONE WEEK IF SPECIATION AND SENSITIVITIES ARE REQUIRED. Performed at [facility], [place] [address], [place], [place] [address]    Report Status PENDING  Incomplete  Blood Culture (routine x 2)     Status: None (Preliminary result)   Collection Time: 10/14/19  8:18 PM   Specimen: BLOOD LEFT FOREARM  Result Value Ref Range Status   Specimen Description   Final    BLOOD LEFT FOREARM Performed at [facility], [place] [address], [place], [place] [address]    Special Requests   Final    BOTTLES DRAWN AEROBIC AND ANAEROBIC Blood Culture results may not be optimal due to an excessive volume of blood received in culture bottles Performed  at [place] [address], [place], [place] [address]    Culture   Final    NO GROWTH 3 DAYS Performed at [facility], [place] [address], [place], [place] [address]    Report Status PENDING  Incomplete  MRSA PCR Screening     Status: None   Collection Time: 10/16/19  7:41 AM   Specimen: Nasal Mucosa; Nasopharyngeal  Result Value Ref Range Status   MRSA by PCR NEGATIVE NEGATIVE Final    Comment:        The GeneXpert MRSA Assay (FDA approved for NASAL specimens only), is one component of a comprehensive MRSA colonization surveillance program. It is not intended to diagnose MRSA infection nor to guide or monitor treatment for MRSA infections. Performed at [facility], [place] [address], [place], [place] [address]          Radiology Studies: No results found.      Scheduled Meds: . albuterol  2 puff Inhalation TID  . ALPRAZolam  0.125 mg Oral BID  . aspirin EC  81 mg Oral Q M,W,F  . azithromycin  500 mg Oral QHS  . cholecalciferol  1,000 Units Oral Daily  . dexamethasone  6 mg Oral Daily  . enoxaparin (LOVENOX) injection  40 mg Subcutaneous Daily  . escitalopram  10 mg Oral Daily  . feeding supplement (ENSURE ENLIVE)  237 mL Oral TID BM  . magnesium oxide  400 mg Oral Daily  . multivitamin with minerals  1 tablet Oral Daily  . rosuvastatin  10 mg Oral QHS   Continuous Infusions: . cefTRIAXone (ROCEPHIN)  IV Stopped (10/16/19 2332)  . remdesivir 100 mg in NS 250 mL Stopped (10/16/19 1816)     LOS: 3 days    Time spent: 25 minutes    Barb Merino, MD [facility] Pager [phone]

## 2019-10-17 NOTE — Progress Notes (Signed)
Pharmacy IV to PO conversion  This patient is receiving Azithromycin by the intravenous route. Based on criteria approved by the Pharmacy and Therapeutics Committee, and the Infectious Disease Division, the antibiotic(s) is/are being converted to equivalent oral dose form(s). These criteria include:   Patient being treated for a respiratory tract infection, urinary tract infection, cellulitis, or Clostridium Difficile Associated Diarrhea  The patient is not neutropenic and does not exhibit a GI malabsorption state  The patient is eating (either orally or per tube) and/or has been taking other orally administered medications for at least 24 hours.  The patient is improving clinically (physician assessment and a 24-hour Tmax of <=100.5 F)  If you have any questions about this conversion, please contact the Pharmacy Department (ext [phone]).  Thank you.  Reuel Boom, PharmD, BCPS [phone] 10/17/2019, 10:26 AM

## 2019-10-18 LAB — CULTURE, BLOOD (ROUTINE X 2): Special Requests: ADEQUATE

## 2019-10-18 LAB — CORTISOL-AM, BLOOD: Cortisol - AM: 1.2 ug/dL — ABNORMAL LOW (ref 6.7–22.6)

## 2019-10-18 MED ORDER — ALBUTEROL SULFATE HFA 108 (90 BASE) MCG/ACT IN AERS: 2.0000 | INHALATION_SPRAY | Freq: Four times a day (QID) | RESPIRATORY_TRACT | Status: DC | PRN

## 2019-10-18 MED ORDER — ALBUTEROL SULFATE HFA 108 (90 BASE) MCG/ACT IN AERS: 2.0000 | INHALATION_SPRAY | Freq: Three times a day (TID) | RESPIRATORY_TRACT | 0 refills | Status: DC

## 2019-10-18 MED ORDER — MIDODRINE HCL 5 MG PO TABS
5.0000 mg | ORAL_TABLET | Freq: Three times a day (TID) | ORAL | Status: DC
  Administered 2019-10-18 – 2019-10-21 (×9): 5 mg via ORAL
  Filled 2019-10-18 (×10): qty 1

## 2019-10-18 MED ORDER — GUAIFENESIN-DM 100-10 MG/5ML PO SYRP: 10.0000 mL | ORAL_SOLUTION | ORAL | 0 refills | Status: DC | PRN

## 2019-10-18 MED ORDER — AZITHROMYCIN 250 MG PO TABS: 250.0000 mg | ORAL_TABLET | Freq: Once | ORAL | 0 refills | Status: DC

## 2019-10-18 MED ORDER — DEXAMETHASONE 6 MG PO TABS: 6.0000 mg | ORAL_TABLET | Freq: Every day | ORAL | 0 refills | Status: DC

## 2019-10-18 MED ORDER — ALPRAZOLAM 0.25 MG PO TABS: 0.1250 mg | ORAL_TABLET | Freq: Two times a day (BID) | ORAL | 0 refills | Status: DC

## 2019-10-18 MED ORDER — SODIUM CHLORIDE 0.9 % IV BOLUS
1000.0000 mL | Freq: Once | INTRAVENOUS | Status: AC
  Administered 2019-10-18: 1000 mL via INTRAVENOUS

## 2019-10-18 NOTE — TOC Initial Note (Signed)
Transition of Care [facility]) - Initial/Assessment Note    Patient Details  Name: Victor Clarke MRN: [id number] Date of Birth: 12-24-1928  [facility] CM/SW Contact:    Purcell Mouton, RN Phone Number: 10/18/2019, 10:49 AM  Clinical Narrative:                  Spoke with niece Fredric Mare [phone] concerning discharge plans to SNF or back to ALF. [name] states that she will call [place] at [facility] to consult on pt coming back there or going to SNF.  Will continue to follow for disposition. Will start SNF process.  Expected Discharge Plan: Skilled Nursing Facility Barriers to Discharge: No Barriers Identified   Patient Goals and CMS Choice Patient states their goals for this hospitalization and ongoing recovery are:: Confused   Choice offered to / list presented to : [facility] POA / Guardian  Expected Discharge Plan and Services Expected Discharge Plan: [place]   Discharge Planning Services: CM Consult   Living arrangements for the past 2 months: [place] Expected Discharge Date: 10/18/19                                    Prior Living Arrangements/Services Living arrangements for the past 2 months: [place] Lives with:: Facility Resident Patient language and need for interpreter reviewed:: No Do you feel safe going back to the place where you live?: Yes               Activities of Daily Living Home Assistive Devices/Equipment: Cane (specify quad or straight) ADL Screening (condition at time of admission) Patient's cognitive ability adequate to safely complete daily activities?: Yes Is the patient deaf or have difficulty hearing?: No Does the patient have difficulty seeing, even when wearing glasses/contacts?: No Does the patient have difficulty concentrating, remembering, or making decisions?: No Patient able to express need for assistance with ADLs?: Yes Does the patient have difficulty  dressing or bathing?: No Independently performs ADLs?: Yes (appropriate for developmental age) Does the patient have difficulty walking or climbing stairs?: No Weakness of Legs: None Weakness of Arms/Hands: None  Permission Sought/Granted Permission sought to share information with : Case Manager                Emotional Assessment Appearance:: Appears stated age     Orientation: : Oriented to Self, Oriented to  Time      Admission diagnosis:  Healthcare-associated pneumonia [J18.9] Elevated serum creatinine [R79.89] 2019 novel coronavirus disease (COVID-19) [U07.1] Pneumonia due to COVID-19 virus [U07.1, J12.89] Patient Active Problem List   Diagnosis Date Noted  . Pneumonia due to COVID-19 virus 10/14/2019  . Hyponatremia 10/14/2019  . Cognitive impairment 01/05/2019  . Problematic consumption of alcohol 12/01/2018  . Tooth impaction 12/01/2018  . Ataxia 12/01/2018  . First degree AV block   . Hypotension due to hypovolemia   . Syncope 06/18/2018  . Leukopenia 06/18/2018  . AV block, [place] II 06/18/2018  . Memory loss 10/22/2016  . Malnutrition of moderate degree 11/15/2015  . Fall from steps 11/07/2015  . Anemia 11/07/2015  . Alcohol dependence ([place]) 11/07/2015  . Pyuria 11/07/2015  . Dehydration 08/08/2015  . UTI (urinary tract infection) 08/08/2015  . High anion gap metabolic acidosis [id number]  . Dizziness 07/31/2015  . Orthostatic hypotension 07/31/2015  . Lactic acidosis 07/31/2015  . Ectopic atrial tachycardia ([place])   .  Hypercholesteremia   . Dyspnea on exertion 09/16/2014  . Hypertension 09/16/2014   PCP:  Sherald Hess., MD Pharmacy:   [facility] [place], [place] [address] [place] [address] Phone: [phone] Fax: [phone]     Social Determinants of Health (SDOH) Interventions    Readmission Risk Interventions No flowsheet data  found.

## 2019-10-18 NOTE — Care Management Important Message (Deleted)
Important Message  Patient Details IM Letter given to Cookie McGibboney RN to present to the Patient Name: Victor Clarke MRN: [id number] Date of Birth: 12-18-1928   Medicare Important Message Given:  Yes     Kerin Salen 10/18/2019, 11:30 AM

## 2019-10-18 NOTE — NC FL2 (Signed)
[place] LEVEL OF CARE SCREENING TOOL     IDENTIFICATION  Patient Name: Victor Clarke Birthdate: 04-Apr-1929 Sex: male Admission Date (Current Location): 10/14/2019  [facility] and [place] Number:  Herbalist and Address:         Provider Number: [id number]  Attending Physician Name and Address:  Rodena Goldmann, DO  Relative Name and Phone Number:  Kaidon Castellano [phone]    Current Level of Care: Hospital Recommended Level of Care: [place] Prior Approval Number:    Date Approved/Denied:   PASRR Number: [id number]  Discharge Plan: SNF    Current Diagnoses: Patient Active Problem List   Diagnosis Date Noted  . Pneumonia due to COVID-19 virus 10/14/2019  . Hyponatremia 10/14/2019  . Cognitive impairment 01/05/2019  . Problematic consumption of alcohol 12/01/2018  . Tooth impaction 12/01/2018  . Ataxia 12/01/2018  . First degree AV block   . Hypotension due to hypovolemia   . Syncope 06/18/2018  . Leukopenia 06/18/2018  . AV block, [place] II 06/18/2018  . Memory loss 10/22/2016  . Malnutrition of moderate degree 11/15/2015  . Fall from steps 11/07/2015  . Anemia 11/07/2015  . Alcohol dependence ([place]) 11/07/2015  . Pyuria 11/07/2015  . Dehydration 08/08/2015  . UTI (urinary tract infection) 08/08/2015  . High anion gap metabolic acidosis [id number]  . Dizziness 07/31/2015  . Orthostatic hypotension 07/31/2015  . Lactic acidosis 07/31/2015  . Ectopic atrial tachycardia ([place])   . Hypercholesteremia   . Dyspnea on exertion 09/16/2014  . Hypertension 09/16/2014    Orientation RESPIRATION BLADDER Height & Weight     Self, Time  Normal Continent Weight: 73.2 kg Height:  5\' 6"  (167.6 cm)  BEHAVIORAL SYMPTOMS/MOOD NEUROLOGICAL BOWEL NUTRITION STATUS      Continent Diet(Heart Healthy)  AMBULATORY STATUS COMMUNICATION OF NEEDS Skin   Extensive Assist Verbally Other (Comment)(Moisture Damaged, Rash Groin)                       Personal Care Assistance Level of Assistance  Bathing Bathing Assistance: Limited assistance         Functional Limitations Info  Hearing, Sight, Speech Sight Info: Adequate Hearing Info: Impaired(HOH) Speech Info: Adequate    SPECIAL CARE FACTORS FREQUENCY  PT (By licensed PT), OT (By licensed OT)     PT Frequency: Eval and Treat OT Frequency: Eval and Treat            Contractures Contractures Info: Not present    Additional Factors Info  Code Status, Allergies Code Status Info: DNR Allergies Info: No Known Allergies           Current Medications (10/18/2019):  This is the current hospital active medication list Current Facility-Administered Medications  Medication Dose Route Frequency Provider Last Rate Last Dose  . 0.9 %  sodium chloride infusion   Intravenous Continuous Barb Merino, MD 75 mL/hr at 10/18/19 0814    . acetaminophen (TYLENOL) tablet 650 mg  650 mg Oral Q6H PRN Gala Romney L, MD      . albuterol (VENTOLIN HFA) 108 (90 Base) MCG/ACT inhaler 2 puff  2 puff Inhalation TID Barb Merino, MD   2 puff at 10/18/19 0817  . ALPRAZolam Duanne Moron) tablet 0.125 mg  0.125 mg Oral BID Gala Romney L, MD   0.125 mg at 10/18/19 0818  . aspirin EC tablet 81 mg  81 mg Oral Q M,W,F Elwyn Reach, MD   81 mg at 10/18/19  0818  . azithromycin (ZITHROMAX) tablet 500 mg  500 mg Oral QHS Polly Cobia, RPH   500 mg at 10/17/19 2246  . cefTRIAXone (ROCEPHIN) 1 g in sodium chloride 0.9 % 100 mL IVPB  1 g Intravenous Q24H Elwyn Reach, MD   Stopped at 10/17/19 2323  . chlorpheniramine-HYDROcodone (TUSSIONEX) 10-8 MG/5ML suspension 5 mL  5 mL Oral Q12H PRN Elwyn Reach, MD   5 mL at 10/17/19 2245  . cholecalciferol (VITAMIN D3) tablet 1,000 Units  1,000 Units Oral Daily Elwyn Reach, MD   1,000 Units at 10/18/19 0817  . dexamethasone (DECADRON) tablet 6 mg  6 mg Oral Daily Barb Merino, MD   6 mg at 10/18/19 0819  . enoxaparin  (LOVENOX) injection 40 mg  40 mg Subcutaneous Daily Elwyn Reach, MD   40 mg at 10/18/19 0817  . escitalopram (LEXAPRO) tablet 10 mg  10 mg Oral Daily Elwyn Reach, MD   10 mg at 10/18/19 0817  . feeding supplement (ENSURE ENLIVE) (ENSURE ENLIVE) liquid 237 mL  237 mL Oral TID BM Barb Merino, MD   237 mL at 10/18/19 0817  . guaiFENesin-dextromethorphan (ROBITUSSIN DM) 100-10 MG/5ML syrup 10 mL  10 mL Oral Q4H PRN Gala Romney L, MD      . hydrocortisone 1 % lotion   Topical BID Barb Merino, MD      . lip balm (CARMEX) ointment   Topical PRN Barb Merino, MD      . magnesium oxide (MAG-OX) tablet 400 mg  400 mg Oral Daily Gala Romney L, MD   400 mg at 10/18/19 0817  . multivitamin with minerals tablet 1 tablet  1 tablet Oral Daily Elwyn Reach, MD   1 tablet at 10/18/19 0817  . ondansetron (ZOFRAN) tablet 4 mg  4 mg Oral Q6H PRN Elwyn Reach, MD       Or  . ondansetron (ZOFRAN) injection 4 mg  4 mg Intravenous Q6H PRN Elwyn Reach, MD      . remdesivir 100 mg in sodium chloride 0.9 % 250 mL IVPB  100 mg Intravenous Q24H Dorrene German, [facility]   Stopped at 10/17/19 1737  . rosuvastatin (CRESTOR) tablet 10 mg  10 mg Oral QHS Elwyn Reach, MD   10 mg at 10/17/19 2246     Discharge Medications: Please see discharge summary for a list of discharge medications.  Relevant Imaging Results:  Relevant Lab Results:   Additional Information [phone]  Purcell Mouton, RN

## 2019-10-18 NOTE — Progress Notes (Signed)
Physical Therapy Treatment Patient Details Name: Victor Clarke MRN: [id number] DOB: 12/22/1928 Today's Date: 10/18/2019    History of Present Illness 85 year old man admitted with fever/cough and dx'd with pna 2* COVID  PMH:  Parkinson's, Etoh, and COPD    PT Comments    RN reports pt was orthostatic this morning so assisted with applying TED hose prior to mobilizing.  Pt assisted with ambulating in room and denied any dizziness or SOB.  Recommend assist for mobility upon d/c for safety.  If ALF unable to provide current assist level, pt may need SNF.    Follow Up Recommendations  SNF     Equipment Recommendations  None recommended by PT    Recommendations for Other Services       Precautions / Restrictions Precautions Precautions: Fall Precaution Comments: RN assisted with placing TED hose due to pt orthostatic earlier this morning    Mobility  Bed Mobility Overal bed mobility: Needs Assistance Bed Mobility: Supine to Sit     Supine to sit: Min guard;HOB elevated     General bed mobility comments: cues for technique  Transfers Overall transfer level: Needs assistance Equipment used: None Transfers: Sit to/from Stand Sit to Stand: Min assist         General transfer comment: assist required to steady upon rising, pt with effortful rise  Ambulation/Gait Ambulation/Gait assistance: Min guard Gait Distance (Feet): 64 Feet Assistive device: Rolling walker (2 wheeled) Gait Pattern/deviations: Step-through pattern;Decreased stride length;Narrow base of support     General Gait Details: pt pushed IV pole and declined using SPC or RW however did need UE support; min/guard for safety, denies any dizziness or SOB   Stairs             Wheelchair Mobility    Modified Rankin (Stroke Patients Only)       Balance                                            Cognition Arousal/Alertness: Awake/alert Behavior During Therapy: WFL for  tasks assessed/performed Overall Cognitive Status: No family/caregiver present to determine baseline cognitive functioning                                 General Comments: decreased memory; cues for safety      Exercises      General Comments        Pertinent Vitals/Pain Pain Assessment: No/denies pain    Home Living                      Prior Function            PT Goals (current goals can now be found in the care plan section) Acute Rehab PT Goals PT Goal Formulation: With patient Time For Goal Achievement: 11/01/19 Potential to Achieve Goals: Good Progress towards PT goals: Progressing toward goals    Frequency    Min 2X/week      PT Plan Current plan remains appropriate    Co-evaluation              AM-PAC PT "6 Clicks" Mobility   Outcome Measure  Help needed turning from your back to your side while in a flat bed without using bedrails?: A Little Help needed moving from lying  on your back to sitting on the side of a flat bed without using bedrails?: A Little Help needed moving to and from a bed to a chair (including a wheelchair)?: A Little Help needed standing up from a chair using your arms (e.g., wheelchair or bedside chair)?: A Little Help needed to walk in hospital room?: A Little Help needed climbing 3-5 steps with a railing? : A Lot 6 Click Score: 17    End of Session Equipment Utilized During Treatment: Gait belt Activity Tolerance: Patient tolerated treatment well Patient left: with call bell/phone within reach;in chair;with chair alarm set Nurse Communication: Mobility status PT Visit Diagnosis: Muscle weakness (generalized) (M62.81);Difficulty in walking, not elsewhere classified (R26.2)     Time: [id number] PT Time Calculation (min) (ACUTE ONLY): 19 min  Charges:  $Gait Training: 8-22 mins                    Carmelia Bake, PT, DPT Acute Rehabilitation Services Office: [phone] Pager:  [phone]  Trena Platt 10/18/2019, 2:17 PM

## 2019-10-18 NOTE — Plan of Care (Signed)
  Problem: Education: Goal: Knowledge of General Education information will improve Description: Including pain rating scale, medication(s)/side effects and non-pharmacologic comfort measures Outcome: Completed/Met   Problem: Health Behavior/Discharge Planning: Goal: Ability to manage health-related needs will improve Outcome: Progressing   Problem: Clinical Measurements: Goal: Ability to maintain clinical measurements within normal limits will improve Outcome: Progressing Goal: Will remain free from infection Outcome: Progressing Goal: Diagnostic test results will improve Outcome: Progressing Goal: Respiratory complications will improve Outcome: Completed/Met Goal: Cardiovascular complication will be avoided Outcome: Completed/Met   Problem: Activity: Goal: Risk for activity intolerance will decrease Outcome: Progressing   Problem: Nutrition: Goal: Adequate nutrition will be maintained Outcome: Progressing   Problem: Coping: Goal: Level of anxiety will decrease Outcome: Completed/Met   Problem: Elimination: Goal: Will not experience complications related to bowel motility Outcome: Completed/Met Goal: Will not experience complications related to urinary retention Outcome: Completed/Met   Problem: Pain Managment: Goal: General experience of comfort will improve Outcome: Progressing   Problem: Safety: Goal: Ability to remain free from injury will improve Outcome: Progressing   Problem: Skin Integrity: Goal: Risk for impaired skin integrity will decrease Outcome: Completed/Met   Problem: Education: Goal: Knowledge of risk factors and measures for prevention of condition will improve Outcome: Progressing   Problem: Coping: Goal: Psychosocial and spiritual needs will be supported Outcome: Completed/Met   Problem: Respiratory: Goal: Will maintain a patent airway Outcome: Completed/Met Goal: Complications related to the disease process, condition or treatment  will be avoided or minimized Outcome: Progressing

## 2019-10-18 NOTE — Discharge Summary (Signed)
Physician Discharge Summary  Victor Clarke [id number] DOB: 11/26/1928 DOA: 10/14/2019  PCP: Sherald Hess., MD  Admit date: 10/14/2019  Discharge date: 10/18/2019  Admitted From:ALF  Disposition:  ALF/SNF  Recommendations for Outpatient Follow-up:  1. Follow up with PCP in 1-2 weeks, recheck BMP in 1 week. 2. Continue on azithromycin for 1 more day as prescribed to complete 5-day course of treatment 3. Continue on dexamethasone as prescribed for several more days to finish course of treatment 4. Avoid home antihypertensives and recheck blood pressures, may resume once blood pressures improved as well as renal function  Home Health: None  Equipment/Devices: None  Discharge Condition: Stable  CODE STATUS: DNR  Diet recommendation: Heart Healthy  Brief/Interim Summary: Per HPI: 85 year old with peripheral neuropathy, copd, hypertension, hyperlipidemia from ALF sent to the emergency room with fever, cough and recently tested COVID-19.  Patient's only complaint was lightheadedness, fatigue and lightheadedness. In the emergency room, he was hemodynamically stable.  His creatinine was slightly elevated from baseline.  Chest x-ray showed bilateral infiltrates.  COVID-19 test was positive.  Emergency room reported that he was 88% on ambulation.  He has mostly remained on room air.  He has been seen and evaluated on the day of discharge and is stable for discharge.  He will need to complete a few more days of dexamethasone as well as 1 more day of azithromycin to finish course of treatment.  He has acute kidney injury has been improving and he will require repeat BMP in 1 week to reassess.  He will also need to ensure that his blood pressures are stable on discharge prior to resuming antihypertensives.  Pneumonia due to COVID-19 virus with hypoxemia -Continue azithromycin for 1 more day to complete course of treatment -Continue dexamethasone as prescribed -4 days of remdesivir  completed while in hospital  AKI-improved -Orthostatics rechecked and improved -Continue to hold home antihypertensives due to soft blood pressure readings, recheck BMP and blood pressure readings outpatient and restart as appropriate  Hypertension -As above  Cognitive impairment with early dementia and questionable Parkinson's -Continue home medications to include benzodiazepines and Lexapro -Continue at ALF versus SNF as appropriate  Discharge Diagnoses:  Principal Problem:   Pneumonia due to COVID-19 virus Active Problems:   Hypertension   Hypercholesteremia   Cognitive impairment   Hyponatremia  Principal discharge diagnosis: Acute hypoxemic respiratory failure secondary to COVID-19 pneumonia.  Discharge Instructions  Discharge Instructions    Diet - low sodium heart healthy   Complete by: As directed    Increase activity slowly   Complete by: As directed      Allergies as of 10/18/2019   No Known Allergies     Medication List    STOP taking these medications   amLODipine-benazepril 5-20 MG capsule Commonly known as: LOTREL     TAKE these medications   albuterol 108 (90 Base) MCG/ACT inhaler Commonly known as: VENTOLIN HFA Inhale 2 puffs into the lungs 3 (three) times daily.   ALPRAZolam 0.25 MG tablet Commonly known as: XANAX Take 0.5 tablets (0.125 mg total) by mouth 2 (two) times daily.   aspirin EC 81 MG tablet Take 81 mg by mouth every Monday, Wednesday, and Friday.   azithromycin 250 MG tablet Commonly known as: ZITHROMAX Take 1 tablet (250 mg total) by mouth once for 1 dose.   cholecalciferol 25 MCG (1000 UT) tablet Commonly known as: VITAMIN D3 Take 1,000 Units by mouth daily.   Coal Tar Extract 2 %  Sham Apply 1 application topically 2 (two) times a week.   dexamethasone 6 MG tablet Commonly known as: DECADRON Take 1 tablet (6 mg total) by mouth daily for 5 days. Start taking on: October 19, 2019   escitalopram 10 MG tablet Commonly  known as: LEXAPRO Take 10 mg by mouth daily.   guaiFENesin-dextromethorphan 100-10 MG/5ML syrup Commonly known as: ROBITUSSIN DM Take 10 mLs by mouth every 4 (four) hours as needed for cough.   Magnesium Oxide 250 MG Tabs Take 500 mg by mouth daily.   miconazole 2 % cream Commonly known as: MICOTIN Apply 1 application topically daily as needed (rash, jock itch).   multivitamin tablet Take 1 tablet by mouth daily.   nystatin cream Commonly known as: MYCOSTATIN Apply topically 2 (two) times daily. Apply on bilateral groins What changed:   how much to take  when to take this  reasons to take this   nystatin-triamcinolone cream Commonly known as: MYCOLOG II Apply 1 application topically 2 (two) times daily.   rosuvastatin 10 MG tablet Commonly known as: CRESTOR Take 10 mg by mouth at bedtime.   triamcinolone cream 0.1 % Commonly known as: KENALOG Apply 1 application topically 2 (two) times daily.   VITAMIN D PO Take 1,000 Units by mouth daily.      Follow-up Information    Sherald Hess., MD Follow up in 1 week(s).   Specialty: Family Medicine Contact information: [place] [id number] [phone]          No Known Allergies  Consultations:  None   Procedures/Studies: Dg Chest Port 1 View  Result Date: 10/14/2019 CLINICAL DATA:  Cough, fever EXAM: PORTABLE CHEST 1 VIEW COMPARISON:  Chest radiograph dated 12/01/2018 FINDINGS: The heart is enlarged. Vascular calcifications are seen in the aortic arch. Bibasilar interstitial and airspace opacities are noted. There is no pleural effusion or pneumothorax. The osseous structures are intact. IMPRESSION: 1. Bibasilar interstitial and airspace opacities. This likely represents a combination of underlying chronic interstitial lung disease with superimposed aspiration/pneumonia. 2. Cardiomegaly. Aortic Atherosclerosis (ICD10-I70.0). Electronically Signed   By: Zerita Boers M.D.   On:  10/14/2019 18:14     Discharge Exam: Vitals:   10/17/19 2134 10/18/19 0526  BP:  125/60  Pulse:  (!) 53  Resp:  20  Temp: 97.9 F (36.6 C) 98 F (36.7 C)  SpO2: 92% 94%   Vitals:   10/17/19 1344 10/17/19 2109 10/17/19 2134 10/18/19 0526  BP: (!) 95/54   125/60  Pulse: 66   (!) 53  Resp: 20   20  Temp: 97.7 F (36.5 C) 97.9 F (36.6 C) 97.9 F (36.6 C) 98 F (36.7 C)  TempSrc: Oral Oral Oral Oral  SpO2: 94% 91% 92% 94%  Weight:      Height:        General: Pt is alert, awake, not in acute distress Cardiovascular: RRR, S1/S2 +, no rubs, no gallops Respiratory: CTA bilaterally, no wheezing, no rhonchi Abdominal: Soft, NT, ND, bowel sounds + Extremities: no edema, no cyanosis    The results of significant diagnostics from this hospitalization (including imaging, microbiology, ancillary and laboratory) are listed below for reference.     Microbiology: Recent Results (from the past 240 hour(s))  Blood Culture (routine x 2)     Status: Abnormal   Collection Time: 10/14/19  8:13 PM   Specimen: BLOOD  Result Value Ref Range Status   Specimen Description   Final  BLOOD LEFT ANTECUBITAL Performed at [facility] [address], [place], [place] [address]    Special Requests   Final    BOTTLES DRAWN AEROBIC AND ANAEROBIC Blood Culture adequate volume Performed at [facility] [address] [place], [place] [address]    Culture  Setup Time   Final    IN BOTH AEROBIC AND ANAEROBIC BOTTLES GRAM POSITIVE COCCI CRITICAL RESULT CALLED TO, READ BACK BY AND VERIFIED WITH: T GREEN PHARMD 10/15/19 1753 JDW    Culture (A)  Final    STAPHYLOCOCCUS SPECIES (COAGULASE NEGATIVE) THE SIGNIFICANCE OF ISOLATING THIS ORGANISM FROM A SINGLE SET OF BLOOD CULTURES WHEN MULTIPLE SETS ARE DRAWN IS UNCERTAIN. PLEASE NOTIFY THE MICROBIOLOGY DEPARTMENT WITHIN ONE WEEK IF SPECIATION AND SENSITIVITIES ARE REQUIRED. Performed at [facility], [place] [address], [place], [place] [address]    Report Status 10/18/2019 FINAL  Final  Blood Culture (routine x 2)     Status: None (Preliminary result)   Collection Time: 10/14/19  8:18 PM   Specimen: BLOOD LEFT FOREARM  Result Value Ref Range Status   Specimen Description   Final    BLOOD LEFT FOREARM Performed at [facility], [place] [address] [place], [place] [address]    Special Requests   Final    BOTTLES DRAWN AEROBIC AND ANAEROBIC Blood Culture results may not be optimal due to an excessive volume of blood received in culture bottles Performed at [facility] [address] [place], [place] [address]    Culture   Final    NO GROWTH 4 DAYS Performed at [facility], [place] [address], [place], [place] [address]    Report Status PENDING  Incomplete  MRSA PCR Screening     Status: None   Collection Time: 10/16/19  7:41 AM   Specimen: Nasal Mucosa; Nasopharyngeal  Result Value Ref Range Status   MRSA by PCR NEGATIVE NEGATIVE Final    Comment:        The GeneXpert MRSA Assay (FDA approved for NASAL specimens only), is one component of a comprehensive MRSA colonization surveillance program. It is not intended to diagnose MRSA infection nor to guide or monitor treatment for MRSA infections. Performed at [facility], [place] [address], [place], [place] [address]      Labs: BNP (last 3 results) No results for input(s): BNP in the last 8760 hours. Basic Metabolic Panel: Recent Labs  Lab 10/14/19 1756 10/15/19 0348 10/16/19 0345 10/17/19 0432  NA 133* 134* 134* 136  K 4.8 4.4 4.7 4.6  CL 102 105 109 107  CO2 23 22 16* 19*  GLUCOSE 118* 123* 159* 166*  BUN 26* 26* 29* 29*  CREATININE 1.72* 1.45* 1.22 1.19  CALCIUM 8.5* 8.2* 8.0* 8.2*  MG  --  1.8 1.8 1.8   Liver Function Tests: Recent Labs  Lab 10/14/19 2052 10/15/19 0348 10/16/19 0345 10/17/19 0432  AST 25 21 26  33  ALT 14 11 14 17   ALKPHOS 39 36* 30* 31*   BILITOT 0.9 0.4 0.4 0.4  PROT 6.9 6.0* 5.3* 5.4*  ALBUMIN 3.7 3.2* 2.7* 2.7*   No results for input(s): LIPASE, AMYLASE in the last 168 hours. No results for input(s): AMMONIA in the last 168 hours. CBC: Recent Labs  Lab 10/14/19 1756 10/15/19 0348 10/16/19 0345 10/17/19 0432  WBC 3.1* 3.4* 2.9* 5.4  NEUTROABS 2.1 2.6 2.2 4.6  HGB 13.8 13.4 12.6* 13.1  HCT  41.7 42.1 39.5 40.1  MCV 91.4 92.9 94.7 92.6  PLT 135* 120* 122* 147*   Cardiac Enzymes: No results for input(s): CKTOTAL, CKMB, CKMBINDEX, TROPONINI in the last 168 hours. BNP: Invalid input(s): POCBNP CBG: No results for input(s): GLUCAP in the last 168 hours. D-Dimer No results for input(s): DDIMER in the last 72 hours. Hgb A1c No results for input(s): HGBA1C in the last 72 hours. Lipid Profile No results for input(s): CHOL, HDL, LDLCALC, TRIG, CHOLHDL, LDLDIRECT in the last 72 hours. Thyroid function studies No results for input(s): TSH, T4TOTAL, T3FREE, THYROIDAB in the last 72 hours.  Invalid input(s): FREET3 Anemia work up Recent Labs    10/16/19 0345 10/17/19 0432  FERRITIN 299 461*   Urinalysis    Component Value Date/Time   COLORURINE YELLOW 12/01/2018 [place] 12/01/2018 1850   LABSPEC 1.023 12/01/2018 1850   PHURINE 6.0 12/01/2018 1850   GLUCOSEU 50 (A) 12/01/2018 1850   HGBUR NEGATIVE 12/01/2018 1850   BILIRUBINUR NEGATIVE 12/01/2018 1850   KETONESUR 5 (A) 12/01/2018 1850   PROTEINUR 30 (A) 12/01/2018 1850   UROBILINOGEN 1.0 08/05/2015 1155   NITRITE NEGATIVE 12/01/2018 1850   LEUKOCYTESUR NEGATIVE 12/01/2018 1850   Sepsis Labs Invalid input(s): PROCALCITONIN,  WBC,  LACTICIDVEN Microbiology Recent Results (from the past 240 hour(s))  Blood Culture (routine x 2)     Status: Abnormal   Collection Time: 10/14/19  8:13 PM   Specimen: BLOOD  Result Value Ref Range Status   Specimen Description   Final    BLOOD LEFT ANTECUBITAL Performed at [facility], [place] [address], [place], [place] [address]    Special Requests   Final    BOTTLES DRAWN AEROBIC AND ANAEROBIC Blood Culture adequate volume Performed at [facility] [address] [place], [place] [address]    Culture  Setup Time   Final    IN BOTH AEROBIC AND ANAEROBIC BOTTLES GRAM POSITIVE COCCI CRITICAL RESULT CALLED TO, READ BACK BY AND VERIFIED WITH: T GREEN PHARMD 10/15/19 1753 JDW    Culture (A)  Final    STAPHYLOCOCCUS SPECIES (COAGULASE NEGATIVE) THE SIGNIFICANCE OF ISOLATING THIS ORGANISM FROM A SINGLE SET OF BLOOD CULTURES WHEN MULTIPLE SETS ARE DRAWN IS UNCERTAIN. PLEASE NOTIFY THE MICROBIOLOGY DEPARTMENT WITHIN ONE WEEK IF SPECIATION AND SENSITIVITIES ARE REQUIRED. Performed at [facility], [place] [address], [place], [place] [address]    Report Status 10/18/2019 FINAL  Final  Blood Culture (routine x 2)     Status: None (Preliminary result)   Collection Time: 10/14/19  8:18 PM   Specimen: BLOOD LEFT FOREARM  Result Value Ref Range Status   Specimen Description   Final    BLOOD LEFT FOREARM Performed at [facility], [place] [address], [place], [place] [address]    Special Requests   Final    BOTTLES DRAWN AEROBIC AND ANAEROBIC Blood Culture results may not be optimal due to an excessive volume of blood received in culture bottles Performed at [facility] [address], [place], [place] [address]    Culture   Final    NO GROWTH 4 DAYS Performed at [facility], [place] [address], [place], [place] [address]    Report Status PENDING  Incomplete  MRSA PCR Screening     Status: None   Collection Time: 10/16/19  7:41 AM   Specimen: Nasal Mucosa; Nasopharyngeal  Result Value Ref Range Status   MRSA  by PCR NEGATIVE NEGATIVE Final    Comment:        The GeneXpert MRSA Assay (FDA approved for NASAL specimens only), is one component of a comprehensive MRSA colonization surveillance program. It is  not intended to diagnose MRSA infection nor to guide or monitor treatment for MRSA infections. Performed at [facility], [place] [address], [place], [place] [address]      Time coordinating discharge: 35 minutes  SIGNED:   Rodena Goldmann, DO [facility] 10/18/2019, 9:50 AM  If 7PM-7AM, please contact night-coverage [email or web address]

## 2019-10-18 NOTE — Plan of Care (Signed)
  Problem: Health Behavior/Discharge Planning: Goal: Ability to manage health-related needs will improve Outcome: Progressing   Problem: Clinical Measurements: Goal: Ability to maintain clinical measurements within normal limits will improve Outcome: Progressing Goal: Will remain free from infection Outcome: Progressing Goal: Diagnostic test results will improve Outcome: Progressing   Problem: Activity: Goal: Risk for activity intolerance will decrease Outcome: Progressing   Problem: Nutrition: Goal: Adequate nutrition will be maintained Outcome: Progressing   Problem: Pain Managment: Goal: General experience of comfort will improve Outcome: Progressing   Problem: Safety: Goal: Ability to remain free from injury will improve Outcome: Progressing   Problem: Education: Goal: Knowledge of risk factors and measures for prevention of condition will improve Outcome: Progressing   Problem: Respiratory: Goal: Complications related to the disease process, condition or treatment will be avoided or minimized Outcome: Progressing

## 2019-10-18 NOTE — Care Management Important Message (Signed)
Important Message  Patient Details IM Letter given Cookie McGibboney RN to present to the Patient Name: Victor Clarke MRN: [id number] Date of Birth: 1929-04-10   Medicare Important Message Given:  Yes     Kerin Salen 10/18/2019, 11:43 AM

## 2019-10-19 ENCOUNTER — Inpatient Hospital Stay (HOSPITAL_COMMUNITY): Payer: Medicare Other

## 2019-10-19 DIAGNOSIS — I340 Nonrheumatic mitral (valve) insufficiency: Secondary | ICD-10-CM

## 2019-10-19 LAB — CULTURE, BLOOD (ROUTINE X 2): Culture: NO GROWTH

## 2019-10-19 LAB — ECHOCARDIOGRAM COMPLETE
Height: 66 in
Weight: 2582.03 oz

## 2019-10-19 MED ORDER — FLUDROCORTISONE ACETATE 0.1 MG PO TABS
0.1000 mg | ORAL_TABLET | Freq: Every day | ORAL | Status: DC
  Administered 2019-10-19 – 2019-10-23 (×5): 0.1 mg via ORAL
  Filled 2019-10-19 (×5): qty 1

## 2019-10-19 NOTE — Progress Notes (Signed)
PROGRESS NOTE    Victor Clarke  [id number] DOB: 1929/07/25 DOA: 10/14/2019 PCP: Sherald Hess., MD   Brief Narrative:  Per HPI: 85 year old with peripheral neuropathy, copd, hypertension, hyperlipidemia from ALF sent to the emergency room with fever, cough and recently tested COVID-19. Patient's only complaint was lightheadedness, fatigue and lightheadedness. In the emergency room, he was hemodynamically stable. His creatinine was slightly elevated from baseline. Chest x-ray showed bilateral infiltrates. COVID-19 test was positive. Emergency room reportedthat he was 88% on ambulation. He has mostly remained on room air.  12/1: He was thought to be ready for discharge on 11/30, however he continued to have persistent orthostatic hypotension and was started on some midodrine and also given a fluid bolus.  Despite these interventions, he continues to have persistent orthostasis and is asymptomatic with ambulation despite his blood pressures being quite low.  I will plan to start him on Florinef today and maintain on midodrine.  Discontinue Xanax as well since this can be contributing to his symptoms.  We will also recheck 2D echocardiogram as prior was done in 2019.  He may discharge to SNF once blood pressures are more stable.  Assessment & Plan:   Principal Problem:   Pneumonia due to COVID-19 virus Active Problems:   Hypertension   Hypercholesteremia   Cognitive impairment   Hyponatremia  Pneumonia due to COVID-19 virus with hypoxemia -Discontinue azithromycin and Rocephin today as he has completed course of treatment -Continue dexamethasone as prescribed - remdesivir completed  Persistent orthostatic hypotension-asymptomatic -Started midodrine on 11/30 without much benefit and repeat fluid boluses have not seemed to help.  Patient does not appear volume depleted. -We will start Florinef 0.1 mg today -Continue with TED hoses with ambulation -2D echocardiogram  ordered today for further evaluation for any potential valvular disorders -Xanax discontinued -Monitor repeat orthostatics in a.m. and if maintaining systolic above 90 without any symptoms may be stable for discharge to SNF.  AKI-improved -Recheck BMP in a.m.  Hypertension -As above -Antihypertensives held  Cognitive impairment with early dementia and questionable Parkinson's -Continue home medications to include benzodiazepines and Lexapro -Continue SNF as appropriate  DVT prophylaxis: Lovenox Code Status: DNR Family Communication: Discussed with niece on phone Disposition Plan: Continue investigation for orthostatic hypotension with 2D echocardiogram ordered.  Florinef restarted as well.  Plan to recheck orthostatics in a.m.  If improving in the next 1 to 2 days may go to rehab as intended.  Discontinue antibiotics today as he has completed course of treatment.   Consultants:   None  Procedures:   None  Antimicrobials:  Anti-infectives (From admission, onward)   Start     Dose/Rate Route Frequency Ordered Stop   10/18/19 0000  azithromycin (ZITHROMAX) 250 MG tablet     250 mg Oral  Once 10/18/19 0918 10/18/19 2359   10/17/19 2200  azithromycin (ZITHROMAX) tablet 500 mg  Status:  Discontinued     500 mg Oral Daily at bedtime 10/17/19 1025 10/19/19 1233   10/15/19 1800  remdesivir 100 mg in sodium chloride 0.9 % 250 mL IVPB     100 mg 500 mL/hr over 30 Minutes Intravenous Every 24 hours 10/14/19 2156 10/18/19 2110   10/14/19 2345  azithromycin (ZITHROMAX) 500 mg in sodium chloride 0.9 % 250 mL IVPB  Status:  Discontinued     500 mg 250 mL/hr over 60 Minutes Intravenous Every 24 hours 10/14/19 2335 10/17/19 1025   10/14/19 2345  cefTRIAXone (ROCEPHIN) 1 g in sodium  chloride 0.9 % 100 mL IVPB  Status:  Discontinued     1 g 200 mL/hr over 30 Minutes Intravenous Every 24 hours 10/14/19 2335 10/19/19 1233   10/14/19 2230  remdesivir 200 mg in sodium chloride 0.9 % 250 mL  IVPB     200 mg 500 mL/hr over 30 Minutes Intravenous Once 10/14/19 2155 10/14/19 2304       Subjective: Patient seen and evaluated today and is quite somnolent this morning.  His repeat orthostatics have demonstrated significant blood pressure drops with standing.  He remains asymptomatic, however.  Objective: Vitals:   10/18/19 1056 10/18/19 1310 10/18/19 2010 10/19/19 0436  BP:  133/70 (!) 155/69 110/60  Pulse:  (!) 47 (!) 48 (!) 43  Resp:  19 18 18   Temp:  (!) 97.5 F (36.4 C) 98.6 F (37 C) 97.7 F (36.5 C)  TempSrc:  Axillary    SpO2: 94% 95% 94% 96%  Weight:      Height:        Intake/Output Summary (Last 24 hours) at 10/19/2019 1234 Last data filed at 10/19/2019 0600 Gross per 24 hour  Intake 2841.54 ml  Output -  Net 2841.54 ml   Filed Weights   10/16/19 1147  Weight: 73.2 kg    Examination:  General exam: Appears calm and comfortable  Respiratory system: Clear to auscultation. Respiratory effort normal. Cardiovascular system: S1 & S2 heard, RRR. No JVD, murmurs, rubs, gallops or clicks. No pedal edema. Gastrointestinal system: Abdomen is nondistended, soft and nontender. No organomegaly or masses felt. Normal bowel sounds heard. Central nervous system: Somnolent Extremities: No significant edema Skin: No rashes, lesions or ulcers Psychiatry: Cannot be assessed given patient condition.    Data Reviewed: I have personally reviewed following labs and imaging studies  CBC: Recent Labs  Lab 10/14/19 1756 10/15/19 0348 10/16/19 0345 10/17/19 0432  WBC 3.1* 3.4* 2.9* 5.4  NEUTROABS 2.1 2.6 2.2 4.6  HGB 13.8 13.4 12.6* 13.1  HCT 41.7 42.1 39.5 40.1  MCV 91.4 92.9 94.7 92.6  PLT 135* 120* 122* [id number]*   Basic Metabolic Panel: Recent Labs  Lab 10/14/19 1756 10/15/19 0348 10/16/19 0345 10/17/19 0432  NA 133* 134* 134* 136  K 4.8 4.4 4.7 4.6  CL 102 105 109 107  CO2 23 22 16* 19*  GLUCOSE 118* 123* 159* 166*  BUN 26* 26* 29* 29*  CREATININE  1.72* 1.45* 1.22 1.19  CALCIUM 8.5* 8.2* 8.0* 8.2*  MG  --  1.8 1.8 1.8   GFR: Estimated Creatinine Clearance: 38.7 mL/min (by C-G formula based on SCr of 1.19 mg/dL). Liver Function Tests: Recent Labs  Lab 10/14/19 2052 10/15/19 0348 10/16/19 0345 10/17/19 0432  AST 25 21 26  33  ALT 14 11 14 17   ALKPHOS 39 36* 30* 31*  BILITOT 0.9 0.4 0.4 0.4  PROT 6.9 6.0* 5.3* 5.4*  ALBUMIN 3.7 3.2* 2.7* 2.7*   No results for input(s): LIPASE, AMYLASE in the last 168 hours. No results for input(s): AMMONIA in the last 168 hours. Coagulation Profile: No results for input(s): INR, PROTIME in the last 168 hours. Cardiac Enzymes: No results for input(s): CKTOTAL, CKMB, CKMBINDEX, TROPONINI in the last 168 hours. BNP (last 3 results) No results for input(s): PROBNP in the last 8760 hours. HbA1C: No results for input(s): HGBA1C in the last 72 hours. CBG: No results for input(s): GLUCAP in the last 168 hours. Lipid Profile: No results for input(s): CHOL, HDL, LDLCALC, TRIG, CHOLHDL, LDLDIRECT in the last  72 hours. Thyroid Function Tests: No results for input(s): TSH, T4TOTAL, FREET4, T3FREE, THYROIDAB in the last 72 hours. Anemia Panel: Recent Labs    10/17/19 0432  FERRITIN 461*   Sepsis Labs: Recent Labs  Lab 10/14/19 2013 10/14/19 2213  PROCALCITON <0.10  --   LATICACIDVEN 1.1 0.9    Recent Results (from the past 240 hour(s))  Blood Culture (routine x 2)     Status: Abnormal   Collection Time: 10/14/19  8:13 PM   Specimen: BLOOD  Result Value Ref Range Status   Specimen Description   Final    BLOOD LEFT ANTECUBITAL Performed at [facility] [address] [place], [place] [address]    Special Requests   Final    BOTTLES DRAWN AEROBIC AND ANAEROBIC Blood Culture adequate volume Performed at [facility] [address] [place], [place] [address]    Culture  Setup Time   Final    IN BOTH AEROBIC AND ANAEROBIC BOTTLES GRAM POSITIVE  [facility] CRITICAL RESULT CALLED TO, READ BACK BY AND VERIFIED WITH: T GREEN PHARMD 10/15/19 1753 JDW    Culture (A)  Final    STAPHYLOCOCCUS SPECIES (COAGULASE NEGATIVE) THE SIGNIFICANCE OF ISOLATING THIS ORGANISM FROM A SINGLE SET OF BLOOD CULTURES WHEN MULTIPLE SETS ARE DRAWN IS UNCERTAIN. PLEASE NOTIFY THE MICROBIOLOGY DEPARTMENT WITHIN ONE WEEK IF SPECIATION AND SENSITIVITIES ARE REQUIRED. Performed at [facility], [place] [address], [place], [place] [address]    Report Status 10/18/2019 FINAL  Final  Blood Culture (routine x 2)     Status: None   Collection Time: 10/14/19  8:18 PM   Specimen: BLOOD LEFT FOREARM  Result Value Ref Range Status   Specimen Description   Final    BLOOD LEFT FOREARM Performed at [facility], [place] [address], [place], [place] [address]    Special Requests   Final    BOTTLES DRAWN AEROBIC AND ANAEROBIC Blood Culture results may not be optimal due to an excessive volume of blood received in culture bottles Performed at [facility] [address], [place], [place] [address]    Culture   Final    NO GROWTH 5 DAYS Performed at [facility], [place] [address], [place], [place] [address]    Report Status 10/19/2019 FINAL  Final  MRSA PCR Screening     Status: None   Collection Time: 10/16/19  7:41 AM   Specimen: Nasal Mucosa; Nasopharyngeal  Result Value Ref Range Status   MRSA by PCR NEGATIVE NEGATIVE Final    Comment:        The GeneXpert MRSA Assay (FDA approved for NASAL specimens only), is one component of a comprehensive MRSA colonization surveillance program. It is not intended to diagnose MRSA infection nor to guide or monitor treatment for MRSA infections. Performed at [facility], [place] [address], [place], [place] [address]          Radiology Studies: No results found.      Scheduled Meds: . aspirin EC  81 mg Oral Q M,W,F  . cholecalciferol  1,000 Units Oral Daily  .  dexamethasone  6 mg Oral Daily  . enoxaparin (LOVENOX) injection  40 mg Subcutaneous Daily  . escitalopram  10 mg Oral Daily  . feeding supplement (ENSURE ENLIVE)  237 mL Oral TID BM  . fludrocortisone  0.1 mg Oral Daily  . hydrocortisone   Topical BID  . magnesium oxide  400 mg  Oral Daily  . midodrine  5 mg Oral TID WC  . multivitamin with minerals  1 tablet Oral Daily  . rosuvastatin  10 mg Oral QHS   Continuous Infusions: . sodium chloride 75 mL/hr at 10/19/19 0600     LOS: 5 days    Time spent: 30 minutes    Kentrell Hallahan Darleen Crocker, DO [facility] Pager [phone]  If 7PM-7AM, please contact night-coverage [email or web address] Password [facility] 10/19/2019, 12:34 PM

## 2019-10-19 NOTE — Progress Notes (Signed)
Pt has had 3 type 7 stools this shift. Incontinent x2. MD on call, [place], notified of stools. Will continue to monitor.

## 2019-10-19 NOTE — Progress Notes (Signed)
  Echocardiogram 2D Echocardiogram has been performed.  Burnett Kanaris 10/19/2019, 3:49 PM

## 2019-10-19 NOTE — Progress Notes (Signed)
Pt not dizzy when up to bathroom, generalized weakness. Pt states that his dizziness is random, not when he stands up

## 2019-10-19 NOTE — Plan of Care (Signed)
  Problem: Health Behavior/Discharge Planning: Goal: Ability to manage health-related needs will improve Outcome: Progressing   Problem: Clinical Measurements: Goal: Ability to maintain clinical measurements within normal limits will improve Outcome: Progressing Goal: Will remain free from infection Outcome: Progressing Goal: Diagnostic test results will improve Outcome: Progressing   Problem: Activity: Goal: Risk for activity intolerance will decrease Outcome: Progressing   Problem: Nutrition: Goal: Adequate nutrition will be maintained Outcome: Progressing   Problem: Pain Managment: Goal: General experience of comfort will improve Outcome: Progressing   Problem: Safety: Goal: Ability to remain free from injury will improve Outcome: Progressing   Problem: Education: Goal: Knowledge of risk factors and measures for prevention of condition will improve Outcome: Progressing   Problem: Respiratory: Goal: Complications related to the disease process, condition or treatment will be avoided or minimized Outcome: Progressing

## 2019-10-20 LAB — BASIC METABOLIC PANEL
Anion gap: 12 (ref 5–15)
BUN: 33 mg/dL — ABNORMAL HIGH (ref 8–23)
CO2: 21 mmol/L — ABNORMAL LOW (ref 22–32)
Calcium: 8.2 mg/dL — ABNORMAL LOW (ref 8.9–10.3)
Chloride: 103 mmol/L (ref 98–111)
Creatinine, Ser: 1.03 mg/dL (ref 0.61–1.24)
GFR calc Af Amer: >60 mL/min (ref >60)
GFR calc non Af Amer: >60 mL/min (ref >60)
Glucose, Bld: 176 mg/dL — ABNORMAL HIGH (ref 70–99)
Potassium: 3.4 mmol/L — ABNORMAL LOW (ref 3.5–5.1)
Sodium: 136 mmol/L (ref 135–145)

## 2019-10-20 LAB — CBC
HCT: 42 % (ref 39.0–52.0)
Hemoglobin: 14.2 g/dL (ref 13.0–17.0)
MCH: 29.8 pg (ref 26.0–34.0)
MCHC: 33.8 g/dL (ref 30.0–36.0)
MCV: 88.1 fL (ref 80.0–100.0)
Platelets: 181 10*3/uL (ref 150–400)
RBC: 4.77 MIL/uL (ref 4.22–5.81)
RDW: 13.1 % (ref 11.5–15.5)
WBC: 5.1 10*3/uL (ref 4.0–10.5)
nRBC: 0 % (ref 0.0–0.2)

## 2019-10-20 NOTE — Progress Notes (Signed)
Night RN reports: Pt Orthostatics noted this am, pt voiced dizziness while standing, will cont to monitor. SRP, RN

## 2019-10-20 NOTE — Progress Notes (Signed)
Occupational Therapy Treatment Patient Details Name: Victor Clarke MRN: [id number] DOB: 1929/03/25 Today's Date: 10/20/2019    History of present illness 85 year old man admitted with fever/cough and dx'd with pna 2* COVID  PMH:  Parkinson's, Etoh, and COPD   OT comments  Pt fatiqued this pm.  BPs low in sitting and standing (see vitals section of chart)  Pt initially said he was dizzy but then said he felt more unsteady and tired.  He did reach one hand out to stabilize himself in standing.     Follow Up Recommendations  SNF    Equipment Recommendations  3 in 1 bedside commode    Recommendations for Other Services      Precautions / Restrictions Precautions Precautions: Fall Restrictions Weight Bearing Restrictions: No       Mobility Bed Mobility         Supine to sit: Min guard;HOB elevated(extra time) Sit to supine: Min guard   General bed mobility comments: pt used bed rail and assist given to watch catheter  Transfers   Equipment used: None   Sit to Stand: Min assist         General transfer comment: steadying assistance    Balance                                           ADL either performed or assessed with clinical judgement   ADL                                         General ADL Comments: pt fatiqued.  sat up and stood up for orthostatics:  he initially said he felt dizzy then just unsteady and weak.  See vitals section of chart     Vision       Perception     Praxis      Cognition Arousal/Alertness: Awake/alert Behavior During Therapy: WFL for tasks assessed/performed Overall Cognitive Status: No family/caregiver present to determine baseline cognitive functioning                                          Exercises     Shoulder Instructions       General Comments      Pertinent Vitals/ Pain       Pain Assessment: No/denies pain  Home Living                                           Prior Functioning/Environment              Frequency  Min 2X/week        Progress Toward Goals  OT Goals(current goals can now be found in the care plan section)  Progress towards OT goals: Progressing toward goals     Plan      Co-evaluation                 AM-PAC OT "6 Clicks" Daily Activity     Outcome Measure   Help from another person eating meals?: A Little Help from another person  taking care of personal grooming?: A Little Help from another person toileting, which includes using toliet, bedpan, or urinal?: A Little Help from another person bathing (including washing, rinsing, drying)?: A Little Help from another person to put on and taking off regular upper body clothing?: A Little Help from another person to put on and taking off regular lower body clothing?: A Little 6 Click Score: 18    End of Session    OT Visit Diagnosis: Unsteadiness on feet (R26.81)   Activity Tolerance Patient limited by fatigue   Patient Left in bed;with call bell/phone within reach;with bed alarm set   Nurse Communication          Time: [id number] OT Time Calculation (min): 10 min  Charges: OT General Charges $OT Visit: 1 Visit OT Treatments $Therapeutic Activity: 8-22 mins  [name], OTR/L Acute Rehabilitation Services [phone] [facility] pager [phone] office 10/20/2019   [place] 10/20/2019, 4:02 PM

## 2019-10-20 NOTE — Plan of Care (Signed)
  Problem: Health Behavior/Discharge Planning: Goal: Ability to manage health-related needs will improve Outcome: Progressing   Problem: Clinical Measurements: Goal: Ability to maintain clinical measurements within normal limits will improve Outcome: Progressing Goal: Will remain free from infection Outcome: Progressing Goal: Diagnostic test results will improve Outcome: Progressing   Problem: Activity: Goal: Risk for activity intolerance will decrease Outcome: Progressing   Problem: Nutrition: Goal: Adequate nutrition will be maintained Outcome: Progressing   Problem: Pain Managment: Goal: General experience of comfort will improve Outcome: Progressing   Problem: Safety: Goal: Ability to remain free from injury will improve Outcome: Progressing   Problem: Education: Goal: Knowledge of risk factors and measures for prevention of condition will improve Outcome: Progressing   Problem: Respiratory: Goal: Complications related to the disease process, condition or treatment will be avoided or minimized Outcome: Progressing

## 2019-10-20 NOTE — Progress Notes (Signed)
PROGRESS NOTE    Victor Clarke  [id number] DOB: Apr 11, 1929 DOA: 10/14/2019 PCP: Sherald Hess., MD   Brief Narrative:  As per previous attending :86 year old with peripheral neuropathy, copd, hypertension, hyperlipidemia from ALF sent to the emergency room with fever, cough and recently tested COVID-19. Patient's only complaint was lightheadedness, fatigue and lightheadedness. In the emergency room, he was hemodynamically stable. His creatinine was slightly elevated from baseline. Chest x-ray showed bilateral infiltrates. COVID-19 test was positive. Emergency room reportedthat he was 88% on ambulation. He has mostly remained on room air.  12/1: He was thought to be ready for discharge on 11/30, however he continued to have persistent orthostatic hypotension and was started on some midodrine and also given a fluid bolus.  Despite these interventions, he continues to have persistent orthostasis and is asymptomatic with ambulation despite his blood pressures being quite low.  I will plan to start him on Florinef today and maintain on midodrine.  Discontinue Xanax as well since this can be contributing to his symptoms.  We will also recheck 2D echocardiogram as prior was done in 2019.  He may discharge to SNF once blood pressures are more stable.  Subjective: Resting on bed, feeling weak. Still orthostatic this am from 150- to 60 on standing and was dizzy 95% on [place], afebrile overnight  k was 3.4, co2 at 21 wbc stable  Assessment & Plan:   Pneumonia due to COVID-19 virus: Off antibiotics completed course.  Patient completed remdesivir, currently on Decadron continue.  Encourage ambulation.  Orthostatic hypotension: Needs to be orthostatic on midodrine since 11/30 as repeated.  Boluses did not help and he did not appear to be volume depleted.  Patient was placed on Florinef 12/1-continue to monitor for any edema/supine hypertension and can adjust dose slowly.  Again monitor  supine hypertension while on midodrine. Echocardiogram obtained reviewed and has normal EF no acute finding.  Continue.  Continue gentle hydration, Florinef, and TED stockings, monitor.  HTN- meds on hold  Hypercholesteremia:  AKI resolved  Hyponatremia: Resolved.  Cognitive impairment with early dementia/questionable Parkinson's continue home Lexapro.  Supportive care  Body mass index is 26.05 kg/m.    DVT prophylaxis:lovenox Code Status: DNR Family Communication: plan of care discussed with patient at bedside. Disposition Plan: Remains inpatient pending improvement in patient's orthostatic vitals.  Patient will eventually need skilled nursing facility  Consultants: none Procedures: none Echocardiogram 12/1  1. Left ventricular ejection fraction, by visual estimation, is 60 to 65%. The left ventricle has normal function. There is no left ventricular hypertrophy.  2. Global right ventricle has normal systolic function.The right ventricular size is normal. No increase in right ventricular wall thickness.  3. Left atrial size was mildly dilated.  4. Right atrial size was normal.  5. Moderate mitral annular calcification.  6. The mitral valve is normal in structure. Mild mitral valve regurgitation. No evidence of mitral stenosis.  7. The tricuspid valve is normal in structure. Tricuspid valve regurgitation is not demonstrated.  8. The aortic valve was not well visualized. Aortic valve regurgitation is not visualized. leaflets calcified with some restricted motion Poor CW signal likely mild AS.  9. The pulmonic valve was normal in structure. Pulmonic valve regurgitation is not visualized. 10. The aortic root was not well visualized. 11. Normal pulmonary artery systolic pressure. 12. The inferior vena cava is normal in size with greater than 50% respiratory variability, suggesting right atrial pressure of 3 mmHg.  Microbiology: coag neg staph in  one set-  contaminant  Antimicrobials: Anti-infectives (From admission, onward)   Start     Dose/Rate Route Frequency Ordered Stop   10/18/19 0000  azithromycin (ZITHROMAX) 250 MG tablet     250 mg Oral  Once 10/18/19 0918 10/18/19 2359   10/17/19 2200  azithromycin (ZITHROMAX) tablet 500 mg  Status:  Discontinued     500 mg Oral Daily at bedtime 10/17/19 1025 10/19/19 1233   10/15/19 1800  remdesivir 100 mg in sodium chloride 0.9 % 250 mL IVPB     100 mg 500 mL/hr over 30 Minutes Intravenous Every 24 hours 10/14/19 2156 10/18/19 2110   10/14/19 2345  azithromycin (ZITHROMAX) 500 mg in sodium chloride 0.9 % 250 mL IVPB  Status:  Discontinued     500 mg 250 mL/hr over 60 Minutes Intravenous Every 24 hours 10/14/19 2335 10/17/19 1025   10/14/19 2345  cefTRIAXone (ROCEPHIN) 1 g in sodium chloride 0.9 % 100 mL IVPB  Status:  Discontinued     1 g 200 mL/hr over 30 Minutes Intravenous Every 24 hours 10/14/19 2335 10/19/19 1233   10/14/19 2230  remdesivir 200 mg in sodium chloride 0.9 % 250 mL IVPB     200 mg 500 mL/hr over 30 Minutes Intravenous Once 10/14/19 2155 10/14/19 2304       Objective: Vitals:   10/19/19 0436 10/19/19 1500 10/19/19 2101 10/20/19 0551  BP: 110/60 131/69 (!) 159/74 (!) 150/70  Pulse: (!) 43 (!) 50 80 73  Resp: 18 13 16 18   Temp: 97.7 F (36.5 C) 97.7 F (36.5 C) 97.8 F (36.6 C) 98.7 F (37.1 C)  TempSrc:  Oral Oral   SpO2: 96%  97% 95%  Weight:      Height:        Intake/Output Summary (Last 24 hours) at 10/20/2019 1059 Last data filed at 10/20/2019 0600 Gross per 24 hour  Intake 1855.07 ml  Output 1800 ml  Net 55.07 ml   Filed Weights   10/16/19 1147  Weight: 73.2 kg   Weight change:   Body mass index is 26.05 kg/m.  Intake/Output from previous day: 12/01 0701 - 12/02 0700 In: 1855.1 [P.O.:267; I.V.:1588.1] Out: 1800 [Urine:1800] Intake/Output this shift: No intake/output data recorded.  Examination:  General exam: AAO,NAD, Weak  appearing. HEENT:Oral mucosa moist, Ear/Nose WNL grossly, dentition normal. Respiratory system: Diminished at the base,no wheezing or crackles,no use of accessory muscle Cardiovascular system: S1 & S2 +, No JVD,. Gastrointestinal system: Abdomen soft, NT,ND, BS+ Nervous System:Alert, awake, moving extremities and grossly nonfocal Extremities: No edema, distal peripheral pulses palpable.  Skin: No rashes,no icterus. MSK: Normal muscle bulk,tone, power  Medications:  Scheduled Meds:  aspirin EC  81 mg Oral Q M,W,F   cholecalciferol  1,000 Units Oral Daily   dexamethasone  6 mg Oral Daily   enoxaparin (LOVENOX) injection  40 mg Subcutaneous Daily   escitalopram  10 mg Oral Daily   feeding supplement (ENSURE ENLIVE)  237 mL Oral TID BM   fludrocortisone  0.1 mg Oral Daily   hydrocortisone   Topical BID   magnesium oxide  400 mg Oral Daily   midodrine  5 mg Oral TID WC   multivitamin with minerals  1 tablet Oral Daily   rosuvastatin  10 mg Oral QHS   Continuous Infusions:  sodium chloride 75 mL/hr at 10/20/19 0600    Data Reviewed: I have personally reviewed following labs and imaging studies  CBC: Recent Labs  Lab 10/14/19 1756 10/15/19 0348  10/16/19 0345 10/17/19 0432 10/20/19 0330  WBC 3.1* 3.4* 2.9* 5.4 5.1  NEUTROABS 2.1 2.6 2.2 4.6  --   HGB 13.8 13.4 12.6* 13.1 14.2  HCT 41.7 42.1 39.5 40.1 42.0  MCV 91.4 92.9 94.7 92.6 88.1  PLT 135* 120* 122* 147* 0000000   Basic Metabolic Panel: Recent Labs  Lab 10/14/19 1756 10/15/19 0348 10/16/19 0345 10/17/19 0432 10/20/19 0330  NA 133* 134* 134* 136 136  K 4.8 4.4 4.7 4.6 3.4*  CL 102 105 109 107 103  CO2 23 22 16* 19* 21*  GLUCOSE 118* 123* 159* 166* 176*  BUN 26* 26* 29* 29* 33*  CREATININE 1.72* 1.45* 1.22 1.19 1.03  CALCIUM 8.5* 8.2* 8.0* 8.2* 8.2*  MG  --  1.8 1.8 1.8  --    GFR: Estimated Creatinine Clearance: 44.7 mL/min (by C-G formula based on SCr of 1.03 mg/dL). Liver Function  Tests: Recent Labs  Lab 10/14/19 2052 10/15/19 0348 10/16/19 0345 10/17/19 0432  AST 25 21 26  33  ALT 14 11 14 17   ALKPHOS 39 36* 30* 31*  BILITOT 0.9 0.4 0.4 0.4  PROT 6.9 6.0* 5.3* 5.4*  ALBUMIN 3.7 3.2* 2.7* 2.7*   No results for input(s): LIPASE, AMYLASE in the last 168 hours. No results for input(s): AMMONIA in the last 168 hours. Coagulation Profile: No results for input(s): INR, PROTIME in the last 168 hours. Cardiac Enzymes: No results for input(s): CKTOTAL, CKMB, CKMBINDEX, TROPONINI in the last 168 hours. BNP (last 3 results) No results for input(s): PROBNP in the last 8760 hours. HbA1C: No results for input(s): HGBA1C in the last 72 hours. CBG: No results for input(s): GLUCAP in the last 168 hours. Lipid Profile: No results for input(s): CHOL, HDL, LDLCALC, TRIG, CHOLHDL, LDLDIRECT in the last 72 hours. Thyroid Function Tests: No results for input(s): TSH, T4TOTAL, FREET4, T3FREE, THYROIDAB in the last 72 hours. Anemia Panel: No results for input(s): VITAMINB12, FOLATE, FERRITIN, TIBC, IRON, RETICCTPCT in the last 72 hours. Sepsis Labs: Recent Labs  Lab 10/14/19 2013 10/14/19 2213  PROCALCITON <0.10  --   LATICACIDVEN 1.1 0.9    Recent Results (from the past 240 hour(s))  Blood Culture (routine x 2)     Status: Abnormal   Collection Time: 10/14/19  8:13 PM   Specimen: BLOOD  Result Value Ref Range Status   Specimen Description   Final    BLOOD LEFT ANTECUBITAL Performed at [facility], [place] [address], [place], [place] [address]    Special Requests   Final    BOTTLES DRAWN AEROBIC AND ANAEROBIC Blood Culture adequate volume Performed at [facility] [address], [place], [place] [address]    Culture  Setup Time   Final    IN BOTH AEROBIC AND ANAEROBIC BOTTLES GRAM POSITIVE COCCI CRITICAL RESULT CALLED TO, READ BACK BY AND VERIFIED WITH: T GREEN PHARMD 10/15/19 1753 JDW    Culture (A)  Final     STAPHYLOCOCCUS SPECIES (COAGULASE NEGATIVE) THE SIGNIFICANCE OF ISOLATING THIS ORGANISM FROM A SINGLE SET OF BLOOD CULTURES WHEN MULTIPLE SETS ARE DRAWN IS UNCERTAIN. PLEASE NOTIFY THE MICROBIOLOGY DEPARTMENT WITHIN ONE WEEK IF SPECIATION AND SENSITIVITIES ARE REQUIRED. Performed at [facility], [place] [address], [place], [place] [address]    Report Status 10/18/2019 FINAL  Final  Blood Culture (routine x 2)     Status: None   Collection Time: 10/14/19  8:18 PM   Specimen: BLOOD LEFT FOREARM  Result Value  Ref Range Status   Specimen Description   Final    BLOOD LEFT FOREARM Performed at [facility], [place] [address] [place], [place] [address]    Special Requests   Final    BOTTLES DRAWN AEROBIC AND ANAEROBIC Blood Culture results may not be optimal due to an excessive volume of blood received in culture bottles Performed at [facility] [address] [place], [place] [address]    Culture   Final    NO GROWTH 5 DAYS Performed at [facility], [place] [address] [place], [place] [address]    Report Status 10/19/2019 FINAL  Final  MRSA PCR Screening     Status: None   Collection Time: 10/16/19  7:41 AM   Specimen: Nasal Mucosa; Nasopharyngeal  Result Value Ref Range Status   MRSA by PCR NEGATIVE NEGATIVE Final    Comment:        The GeneXpert MRSA Assay (FDA approved for NASAL specimens only), is one component of a comprehensive MRSA colonization surveillance program. It is not intended to diagnose MRSA infection nor to guide or monitor treatment for MRSA infections. Performed at [facility], [place] [address], [place], [place] [address]       Radiology Studies: No results found.    LOS: 6 days   Time spent: More than 50% of that time was spent in counseling and/or coordination of care.  Antonieta Pert, MD [facility]  10/20/2019, 10:59 AM

## 2019-10-20 NOTE — Plan of Care (Signed)
  Problem: Health Behavior/Discharge Planning: Goal: Ability to manage health-related needs will improve Outcome: Progressing   Problem: Clinical Measurements: Goal: Will remain free from infection Outcome: Progressing   

## 2019-10-21 MED ORDER — POTASSIUM CHLORIDE CRYS ER 20 MEQ PO TBCR
40.0000 meq | EXTENDED_RELEASE_TABLET | Freq: Once | ORAL | Status: AC
  Administered 2019-10-21: 40 meq via ORAL
  Filled 2019-10-21: qty 2

## 2019-10-21 MED ORDER — MIDODRINE HCL 5 MG PO TABS
10.0000 mg | ORAL_TABLET | Freq: Three times a day (TID) | ORAL | Status: DC
  Administered 2019-10-21 – 2019-10-22 (×4): 10 mg via ORAL
  Filled 2019-10-21 (×6): qty 2

## 2019-10-21 NOTE — Progress Notes (Addendum)
PROGRESS NOTE    Victor Clarke  [id number] DOB: [birth year] DOA: 10/14/2019 PCP: Sherald Hess., MD   Brief Narrative:  As per previous review:85 year old with peripheral neuropathy, copd, hypertension, hyperlipidemia from ALF sent to the emergency room with fever, cough and recently tested COVID-19. Patient's only complaint was lightheadedness, fatigue and lightheadedness. In the emergency room, he was hemodynamically stable. His creatinine was slightly elevated from baseline. Chest x-ray showed bilateral infiltrates. COVID-19 test was positive. Emergency room reportedthat he was 88% on ambulation. He has mostly remained on room air.  12/1:He was thought to be ready for discharge on 11/30, however he continued to have persistent orthostatic hypotension and was started on some midodrine and also given a fluid bolus.  Despite these interventions, he continues to have persistent orthostasis and is asymptomatic with ambulation despite his blood pressures being quite low.started him on Florinef and maintained on midodrine. Discontinued Xanax as well since this can be contributing to his symptoms. ordered 2D echocardiogram as prior was done in 2019.    Continues to orthostatic and symptomatic  Subjective: Seen this morning, patient was dizzy on sitting up in the bed blood pressure dropped to 888/53 from 111/58 and patient was laid on the bed and standing blood pressure was not measured since blood pressure started to drop.  Overnight no acute events.Afebrile.Saturating 95% on room air. HR in sinus bradycardia but not symptomatic.  Assessment & Plan:   Pneumonia due to COVID-19 virus: Patient finished the course of  remdesivir antibiotics and on p.o. Decadron. No respiratory symptoms currently, doing well on room air.  Orthostatic hypotension: again orthostatic this morning, increase midodrine to 10 mg 3 times daily 12/3,  continue Florinef 0.1 mg- started 12/1. IVF Boluses did  not help and he did not appear to be volume depleted. In 75 ml/hr nss. Lower leg stockings was added 12/2. Watch for supine hypertension.  Continue to monitor orthostatics and supine hypertension. Echocardiogram obtained reviewed and has normal EF with no acute finding.  Cont to monitor and adjust. If persistent will consider cardio eval for recommendations.  Sinus Bradycardia Hr in 40s-was in 70s yesterday during orthostatic vitals mostly- otherwise in 40s-resume on tele monitor.Not on beta-blockers. Check tsh,TTE stable w Nl LVEF. Will obtain Cardio c/s as he is also orthostatics to see if they have any recommendations.  HTN-meds on hold given orthostatic hypotension.  Hypercholesteremia: On Crestor  AKI resolved  Hyponatremia: Resolved.  Cognitive impairment with early dementia/questionable Parkinson's continue home Lexapro.  Supportive care.  Addendum at 15:10: Discussed w Dr Marisue Ivan- he reviewed in ekg today and tele  few days back and bradycardia is not new, discussed abt orthostatics- cont on current plans as above, supportive care. Repeat EKG in am, cardio will see him in am unless anything changes today/overnight. Repleted magnesium and potassium. Body mass index is 26.05 kg/m.   DVT prophylaxis:lovenox Code Status: DNR Family Communication: plan of care discussed with patient at bedside. Called his brother [name] to update- no answer. Disposition Plan: Remains inpatient pending improvement in patient's orthostatic vitals.  Patient will eventually need skilled nursing facility once stable.  Consultants: none Procedures: none Echocardiogram 12/1  1. Left ventricular ejection fraction, by visual estimation, is 60 to 65%. The left ventricle has normal function. There is no left ventricular hypertrophy.  2. Global right ventricle has normal systolic function.The right ventricular size is normal. No increase in right ventricular wall thickness.  3. Left atrial size was mildly  dilated.  4. Right atrial size was normal.  5. Moderate mitral annular calcification.  6. The mitral valve is normal in structure. Mild mitral valve regurgitation. No evidence of mitral stenosis.  7. The tricuspid valve is normal in structure. Tricuspid valve regurgitation is not demonstrated.  8. The aortic valve was not well visualized. Aortic valve regurgitation is not visualized. leaflets calcified with some restricted motion Poor CW signal likely mild AS.  9. The pulmonic valve was normal in structure. Pulmonic valve regurgitation is not visualized. 10. The aortic root was not well visualized. 11. Normal pulmonary artery systolic pressure. 12. The inferior vena cava is normal in size with greater than 50% respiratory variability, suggesting right atrial pressure of 3 mmHg.  Microbiology: coag neg staph in one set- contaminant  Antimicrobials: Anti-infectives (From admission, onward)   Start     Dose/Rate Route Frequency Ordered Stop   10/18/19 0000  azithromycin (ZITHROMAX) 250 MG tablet     250 mg Oral  Once 10/18/19 0918 10/18/19 2359   10/17/19 2200  azithromycin (ZITHROMAX) tablet 500 mg  Status:  Discontinued     500 mg Oral Daily at bedtime 10/17/19 1025 10/19/19 1233   10/15/19 1800  remdesivir 100 mg in sodium chloride 0.9 % 250 mL IVPB     100 mg 500 mL/hr over 30 Minutes Intravenous Every 24 hours 10/14/19 2156 10/18/19 2110   10/14/19 2345  azithromycin (ZITHROMAX) 500 mg in sodium chloride 0.9 % 250 mL IVPB  Status:  Discontinued     500 mg 250 mL/hr over 60 Minutes Intravenous Every 24 hours 10/14/19 2335 10/17/19 1025   10/14/19 2345  cefTRIAXone (ROCEPHIN) 1 g in sodium chloride 0.9 % 100 mL IVPB  Status:  Discontinued     1 g 200 mL/hr over 30 Minutes Intravenous Every 24 hours 10/14/19 2335 10/19/19 1233   10/14/19 2230  remdesivir 200 mg in sodium chloride 0.9 % 250 mL IVPB     200 mg 500 mL/hr over 30 Minutes Intravenous Once 10/14/19 2155 10/14/19 2304        Objective: Vitals:   10/21/19 0533 10/21/19 0828 10/21/19 0941 10/21/19 0944  BP: (!) 154/91 (!) 143/65    Pulse: (!) 42 (!) 47    Resp: 18 18    Temp: 97.9 F (36.6 C) 98.2 F (36.8 C)    TempSrc: Oral Oral    SpO2: 92% 93% 96% 95%  Weight:      Height:        Intake/Output Summary (Last 24 hours) at 10/21/2019 1142 Last data filed at 10/21/2019 0800 Gross per 24 hour  Intake 2010.99 ml  Output 1500 ml  Net 510.99 ml   Filed Weights   10/16/19 1147  Weight: 73.2 kg   Weight change:   Body mass index is 26.05 kg/m.  Intake/Output from previous day: 12/02 0701 - 12/03 0700 In: 1630.1 [I.V.:1630.1] Out: 1500 [Urine:1500] Intake/Output this shift: Total I/O In: 380.9 [P.O.:240; I.V.:140.9] Out: -   Examination:  General exam: AAO,NAD, weak/frail. HEENT:Oral mucosa moist, Ear/Nose WNL grossly, dentition normal. Respiratory system:b/l clear without wheezing or crackles,no use of accessory muscle Cardiovascular system: S1 & S2 +, No JVD, regular RR. Gastrointestinal system: Abdomen soft, NT,ND, BS+ Nervous System:Alert, awake, moving extremities and grossly nonfocal Extremities: No edema, distal peripheral pulses palpable.  Skin: No rashes,no icterus. MSK: Normal muscle bulk,tone, power   Medications:  Scheduled Meds: . aspirin EC  81 mg Oral Q M,W,F  . cholecalciferol  1,000 Units Oral  Daily  . dexamethasone  6 mg Oral Daily  . enoxaparin (LOVENOX) injection  40 mg Subcutaneous Daily  . escitalopram  10 mg Oral Daily  . feeding supplement (ENSURE ENLIVE)  237 mL Oral TID BM  . fludrocortisone  0.1 mg Oral Daily  . hydrocortisone   Topical BID  . magnesium oxide  400 mg Oral Daily  . midodrine  10 mg Oral TID WC  . multivitamin with minerals  1 tablet Oral Daily  . rosuvastatin  10 mg Oral QHS   Continuous Infusions: . sodium chloride 75 mL/hr at 10/21/19 0800    Data Reviewed: I have personally reviewed following labs and imaging studies  CBC:  Recent Labs  Lab 10/14/19 1756 10/15/19 0348 10/16/19 0345 10/17/19 0432 10/20/19 0330  WBC 3.1* 3.4* 2.9* 5.4 5.1  NEUTROABS 2.1 2.6 2.2 4.6  --   HGB 13.8 13.4 12.6* 13.1 14.2  HCT 41.7 42.1 39.5 40.1 42.0  MCV 91.4 92.9 94.7 92.6 88.1  PLT 135* 120* 122* 147* 0000000   Basic Metabolic Panel: Recent Labs  Lab 10/14/19 1756 10/15/19 0348 10/16/19 0345 10/17/19 0432 10/20/19 0330  NA 133* 134* 134* 136 136  K 4.8 4.4 4.7 4.6 3.4*  CL 102 105 109 107 103  CO2 23 22 16* 19* 21*  GLUCOSE 118* 123* 159* 166* 176*  BUN 26* 26* 29* 29* 33*  CREATININE 1.72* 1.45* 1.22 1.19 1.03  CALCIUM 8.5* 8.2* 8.0* 8.2* 8.2*  MG  --  1.8 1.8 1.8  --    GFR: Estimated Creatinine Clearance: 44.7 mL/min (by C-G formula based on SCr of 1.03 mg/dL). Liver Function Tests: Recent Labs  Lab 10/14/19 2052 10/15/19 0348 10/16/19 0345 10/17/19 0432  AST 25 21 26  33  ALT 14 11 14 17   ALKPHOS 39 36* 30* 31*  BILITOT 0.9 0.4 0.4 0.4  PROT 6.9 6.0* 5.3* 5.4*  ALBUMIN 3.7 3.2* 2.7* 2.7*   No results for input(s): LIPASE, AMYLASE in the last 168 hours. No results for input(s): AMMONIA in the last 168 hours. Coagulation Profile: No results for input(s): INR, PROTIME in the last 168 hours. Cardiac Enzymes: No results for input(s): CKTOTAL, CKMB, CKMBINDEX, TROPONINI in the last 168 hours. BNP (last 3 results) No results for input(s): PROBNP in the last 8760 hours. HbA1C: No results for input(s): HGBA1C in the last 72 hours. CBG: No results for input(s): GLUCAP in the last 168 hours. Lipid Profile: No results for input(s): CHOL, HDL, LDLCALC, TRIG, CHOLHDL, LDLDIRECT in the last 72 hours. Thyroid Function Tests: No results for input(s): TSH, T4TOTAL, FREET4, T3FREE, THYROIDAB in the last 72 hours. Anemia Panel: No results for input(s): VITAMINB12, FOLATE, FERRITIN, TIBC, IRON, RETICCTPCT in the last 72 hours. Sepsis Labs: Recent Labs  Lab 10/14/19 2013 10/14/19 2213  PROCALCITON <0.10  --    LATICACIDVEN 1.1 0.9    Recent Results (from the past 240 hour(s))  Blood Culture (routine x 2)     Status: Abnormal   Collection Time: 10/14/19  8:13 PM   Specimen: BLOOD  Result Value Ref Range Status   Specimen Description   Final    BLOOD LEFT ANTECUBITAL Performed at [facility], [place] [address], [place], [place] [address]    Special Requests   Final    BOTTLES DRAWN AEROBIC AND ANAEROBIC Blood Culture adequate volume Performed at [facility] [address] [place], [place] [address]    Culture  Setup Time   Final  IN BOTH AEROBIC AND ANAEROBIC BOTTLES GRAM POSITIVE COCCI CRITICAL RESULT CALLED TO, READ BACK BY AND VERIFIED WITH: T GREEN PHARMD 10/15/19 1753 JDW    Culture (A)  Final    STAPHYLOCOCCUS SPECIES (COAGULASE NEGATIVE) THE SIGNIFICANCE OF ISOLATING THIS ORGANISM FROM A SINGLE SET OF BLOOD CULTURES WHEN MULTIPLE SETS ARE DRAWN IS UNCERTAIN. PLEASE NOTIFY THE MICROBIOLOGY DEPARTMENT WITHIN ONE WEEK IF SPECIATION AND SENSITIVITIES ARE REQUIRED. Performed at [facility], [place] [address] [place], [place] [address]    Report Status 10/18/2019 FINAL  Final  Blood Culture (routine x 2)     Status: None   Collection Time: 10/14/19  8:18 PM   Specimen: BLOOD LEFT FOREARM  Result Value Ref Range Status   Specimen Description   Final    BLOOD LEFT FOREARM Performed at [facility], [place] [address] [place], [place] [address]    Special Requests   Final    BOTTLES DRAWN AEROBIC AND ANAEROBIC Blood Culture results may not be optimal due to an excessive volume of blood received in culture bottles Performed at [facility] [address], [place], [place] [address]    Culture   Final    NO GROWTH 5 DAYS Performed at [facility], [place] [address] [place], [place] [address]    Report Status 10/19/2019 FINAL  Final  MRSA PCR Screening     Status: None   Collection Time: 10/16/19  7:41 AM    Specimen: Nasal Mucosa; Nasopharyngeal  Result Value Ref Range Status   MRSA by PCR NEGATIVE NEGATIVE Final    Comment:        The GeneXpert MRSA Assay (FDA approved for NASAL specimens only), is one component of a comprehensive MRSA colonization surveillance program. It is not intended to diagnose MRSA infection nor to guide or monitor treatment for MRSA infections. Performed at [facility], [place] [address] [place], [place] [address]       Radiology Studies: No results found.    LOS: 7 days   Time spent: More than 50% of that time was spent in counseling and/or coordination of care.  Antonieta Pert, MD [facility]  10/21/2019, 11:42 AM

## 2019-10-21 NOTE — Progress Notes (Signed)
Physical Therapy Treatment Patient Details Name: Victor Clarke MRN: [id number] DOB: Jan 24, 1929 Today's Date: 10/21/2019    History of Present Illness 85 year old man admitted with fever/cough and dx'd with pna 2* COVID  PMH:  Parkinson's, Etoh, and COPD    PT Comments    Pt continues to need min guard to min A for transfers and gait.  He needs cues for safety.  Pt was limited due to orthostatic hypotension.  Required rest breaks between transfers.  Cont POC.    Follow Up Recommendations  SNF     Equipment Recommendations  None recommended by PT    Recommendations for Other Services       Precautions / Restrictions Precautions Precautions: Fall Precaution Comments: RN reports still orthostatic and bradycardiac (MDs aware) - monitor vitals    Mobility  Bed Mobility Overal bed mobility: Needs Assistance Bed Mobility: Supine to Sit     Supine to sit: Min guard;HOB elevated Sit to supine: Min guard   General bed mobility comments: HOB elevated, use of bed rails;  performed each transfer twice  Transfers Overall transfer level: Needs assistance Equipment used: Rolling walker (2 wheeled) Transfers: Sit to/from Stand Sit to Stand: Min guard         General transfer comment: performed x 4; toielting ADLs with min guard for balance  Ambulation/Gait Ambulation/Gait assistance: Min guard Gait Distance (Feet): 50 Feet Assistive device: IV Pole Gait Pattern/deviations: Step-through pattern;Decreased stride length;Narrow base of support     General Gait Details: 50'x2; cues for safety; limited by fatigue/dizziness   Stairs             Wheelchair Mobility    Modified Rankin (Stroke Patients Only)       Balance Overall balance assessment: Needs assistance Sitting-balance support: Feet supported Sitting balance-Leahy Scale: Good     Standing balance support: During functional activity;No upper extremity supported Standing balance-Leahy Scale: Fair                               Cognition Arousal/Alertness: Awake/alert Behavior During Therapy: WFL for tasks assessed/performed Overall Cognitive Status: No family/caregiver present to determine baseline cognitive functioning                                 General Comments: decreased memory; cues for safety      Exercises      General Comments        Pertinent Vitals/Pain Pain Assessment: No/denies pain    Vitals as follows: Sitting: 136/76 , HR 55 bpm, 95% post 1st walk: 95/51; HR 75 bpm; O2 93% post 2nd walk: 78/50; HR 73 bpm; O2 93% Return to supine: 126/72; HR 55 bpm; O2 95%  Home Living                      Prior Function            PT Goals (current goals can now be found in the care plan section) Progress towards PT goals: Progressing toward goals    Frequency    Min 2X/week      PT Plan Current plan remains appropriate    Co-evaluation              AM-PAC PT "6 Clicks" Mobility   Outcome Measure  Help needed turning from your back to your side while  in a flat bed without using bedrails?: None Help needed moving from lying on your back to sitting on the side of a flat bed without using bedrails?: None Help needed moving to and from a bed to a chair (including a wheelchair)?: None Help needed standing up from a chair using your arms (e.g., wheelchair or bedside chair)?: None Help needed to walk in hospital room?: A Little Help needed climbing 3-5 steps with a railing? : A Lot 6 Click Score: 21    End of Session Equipment Utilized During Treatment: Gait belt Activity Tolerance: Patient tolerated treatment well Patient left: with call bell/phone within reach;in bed;with bed alarm set Nurse Communication: Mobility status PT Visit Diagnosis: Muscle weakness (generalized) (M62.81);Difficulty in walking, not elsewhere classified (R26.2)     Time: 1450-1520 PT Time Calculation (min) (ACUTE ONLY): 30  min  Charges:  $Gait Training: 8-22 mins $Therapeutic Activity: 8-22 mins                     Maggie Font, PT Acute Rehab Services Pager [phone] [facility] [phone] [facility] 10/21/2019, 4:10 PM

## 2019-10-21 NOTE — Plan of Care (Signed)
Continue current POC 

## 2019-10-21 NOTE — Care Management Important Message (Signed)
Important Message  Patient Details IM Letter given to [facility] SW to present to the Patient Name: Victor Clarke MRN: [id number] Date of Birth: 06-14-1929   Medicare Important Message Given:  Yes     Kerin Salen 10/21/2019, 12:25 PM

## 2019-10-22 ENCOUNTER — Other Ambulatory Visit: Payer: Self-pay

## 2019-10-22 DIAGNOSIS — I951 Orthostatic hypotension: Secondary | ICD-10-CM

## 2019-10-22 DIAGNOSIS — J189 Pneumonia, unspecified organism: Secondary | ICD-10-CM

## 2019-10-22 DIAGNOSIS — U071 COVID-19: Principal | ICD-10-CM

## 2019-10-22 LAB — BASIC METABOLIC PANEL
Anion gap: 11 (ref 5–15)
BUN: 31 mg/dL — ABNORMAL HIGH (ref 8–23)
CO2: 21 mmol/L — ABNORMAL LOW (ref 22–32)
Calcium: 8 mg/dL — ABNORMAL LOW (ref 8.9–10.3)
Chloride: 103 mmol/L (ref 98–111)
Creatinine, Ser: 0.76 mg/dL (ref 0.61–1.24)
GFR calc Af Amer: >60 mL/min (ref >60)
GFR calc non Af Amer: >60 mL/min (ref >60)
Glucose, Bld: 179 mg/dL — ABNORMAL HIGH (ref 70–99)
Potassium: 3.2 mmol/L — ABNORMAL LOW (ref 3.5–5.1)
Sodium: 135 mmol/L (ref 135–145)

## 2019-10-22 LAB — T4, FREE: Free T4: 1.18 ng/dL — ABNORMAL HIGH (ref 0.61–1.12)

## 2019-10-22 LAB — TSH: TSH: 0.261 u[IU]/mL — ABNORMAL LOW (ref 0.350–4.500)

## 2019-10-22 MED ORDER — MIDODRINE HCL 5 MG PO TABS
5.0000 mg | ORAL_TABLET | Freq: Three times a day (TID) | ORAL | Status: DC
  Administered 2019-10-23 – 2019-10-26 (×9): 5 mg via ORAL
  Filled 2019-10-22 (×12): qty 1

## 2019-10-22 MED ORDER — POTASSIUM CHLORIDE CRYS ER 20 MEQ PO TBCR
40.0000 meq | EXTENDED_RELEASE_TABLET | Freq: Once | ORAL | Status: AC
  Administered 2019-10-22: 40 meq via ORAL
  Filled 2019-10-22: qty 2

## 2019-10-22 MED ORDER — POTASSIUM CHLORIDE CRYS ER 20 MEQ PO TBCR
20.0000 meq | EXTENDED_RELEASE_TABLET | Freq: Every day | ORAL | Status: DC
  Administered 2019-10-22 – 2019-10-26 (×5): 20 meq via ORAL
  Filled 2019-10-22 (×5): qty 1

## 2019-10-22 MED ORDER — HYDRALAZINE HCL 25 MG PO TABS
25.0000 mg | ORAL_TABLET | Freq: Once | ORAL | Status: AC
  Administered 2019-10-22: 25 mg via ORAL
  Filled 2019-10-22: qty 1

## 2019-10-22 MED ORDER — POTASSIUM CHLORIDE CRYS ER 20 MEQ PO TBCR
60.0000 meq | EXTENDED_RELEASE_TABLET | Freq: Once | ORAL | Status: AC
  Administered 2019-10-22: 60 meq via ORAL
  Filled 2019-10-22: qty 3

## 2019-10-22 NOTE — Progress Notes (Signed)
PROGRESS NOTE    Victor Clarke  [id number] DOB: May 08, 1929 DOA: 10/14/2019 PCP: Sherald Hess., MD   Brief Narrative:  As per previous review:85 year old with peripheral neuropathy, copd, hypertension, hyperlipidemia from ALF sent to the emergency room with fever, cough and recently tested COVID-19. Patient's only complaint was lightheadedness, fatigue and lightheadedness. In the emergency room, he was hemodynamically stable. His creatinine was slightly elevated from baseline. Chest x-ray showed bilateral infiltrates. COVID-19 test was positive. Emergency room reportedthat he was 88% on ambulation. He has mostly remained on room air.  12/1:He was thought to be ready for discharge on 11/30, however he continued to have persistent orthostatic hypotension and was started on some midodrine and also given a fluid bolus.  Despite these interventions, he continues to have persistent orthostasis and is asymptomatic with ambulation despite his blood pressures being quite low.started him on Florinef and maintained on midodrine. Discontinued Xanax as well since this can be contributing to his symptoms. ordered 2D echocardiogram as prior was done in 2019.   Continues to be orthostatic and symptomatic, also bradycardic. Cardi consulted  Subjective: Seen this am, no new complaints. Bp was in  morning, patient was dizzy on sitting up in the bed blood pressure dropped to 888/53 from 111/58 and patient was laid on the bed and standing blood pressure was not measured since blood pressure started to drop.  Overnight no acute events.Afebrile.Saturating 95% on room air. HR in sinus bradycardia but not symptomatic.  Assessment & Plan:   Pneumonia due to COVID-19 virus: Patient finished the course of  remdesivir antibiotics and on p.o. Decadron. No respiratory symptoms and doing well on RA.   Orthostatic hypotension: still orthostatic BP laying down 155/70 then 130/73 while sitting. Did not drop  in 80s like other days. Midodrine was increased to 10 mg 3 times daily 12/3 and on Florinef 0.1 mg (12/1)- now with supine hypertension- will stop iv nss-if BP still high will cut down midodrine.Cont on Lower leg stockings-added 12/2. Monitor  for supine hypertension.  Continue to monitor orthostatics, cardio on board- pt on decadron for 7 days now, cortisol suppressed likely form being on steroid, less likely adrenal insufficiency, discussed with pharmacy ?  Value of cosyntropin test as he is on a steroid. Echocardiogram shows normal EF with no acute finding. He may essentially be bed bound if it is does not improve, wonder if it due to his ?parkinson's/autonomic dysregulation.  Sinus Bradycardia Hr in 40s-goes up some with activity.  TSH suppressed check free T4.  Echo stable.  Cardio is consulted.    Hypokalemia replete.  HTN-meds on hold given orthostatic hypotension. Bp in 190s will write for hydralazine po x1. Avoid aggressive control due to #1  Hypercholesteremia: On Crestor  AKI resolved  Hyponatremia: Resolved.  Cognitive impairment with early dementia/questionable Parkinson's continue home Lexapro.  Supportive care.  Body mass index is 26.05 kg/m.   DVT prophylaxis:lovenox Code Status: DNR Family Communication: plan of care discussed with patient at bedside.  Unable to contact his brother [name] to update Disposition Plan: Remains inpatient pending improvement in patient's orthostatic vitals.  Will need to skilled nursing facility once stable.   Consultants: none Procedures: none Echocardiogram 12/1  1. Left ventricular ejection fraction, by visual estimation, is 60 to 65%. The left ventricle has normal function. There is no left ventricular hypertrophy.  2. Global right ventricle has normal systolic function.The right ventricular size is normal. No increase in right ventricular wall thickness.  3. Left atrial size was mildly dilated.  4. Right atrial size was normal.  5.  Moderate mitral annular calcification.  6. The mitral valve is normal in structure. Mild mitral valve regurgitation. No evidence of mitral stenosis.  7. The tricuspid valve is normal in structure. Tricuspid valve regurgitation is not demonstrated.  8. The aortic valve was not well visualized. Aortic valve regurgitation is not visualized. leaflets calcified with some restricted motion Poor CW signal likely mild AS.  9. The pulmonic valve was normal in structure. Pulmonic valve regurgitation is not visualized. 10. The aortic root was not well visualized. 11. Normal pulmonary artery systolic pressure. 12. The inferior vena cava is normal in size with greater than 50% respiratory variability, suggesting right atrial pressure of 3 mmHg.  Microbiology: coag neg staph in one set- contaminant  Antimicrobials: Anti-infectives (From admission, onward)   Start     Dose/Rate Route Frequency Ordered Stop   10/18/19 0000  azithromycin (ZITHROMAX) 250 MG tablet     250 mg Oral  Once 10/18/19 0918 10/18/19 2359   10/17/19 2200  azithromycin (ZITHROMAX) tablet 500 mg  Status:  Discontinued     500 mg Oral Daily at bedtime 10/17/19 1025 10/19/19 1233   10/15/19 1800  remdesivir 100 mg in sodium chloride 0.9 % 250 mL IVPB     100 mg 500 mL/hr over 30 Minutes Intravenous Every 24 hours 10/14/19 2156 10/18/19 2110   10/14/19 2345  azithromycin (ZITHROMAX) 500 mg in sodium chloride 0.9 % 250 mL IVPB  Status:  Discontinued     500 mg 250 mL/hr over 60 Minutes Intravenous Every 24 hours 10/14/19 2335 10/17/19 1025   10/14/19 2345  cefTRIAXone (ROCEPHIN) 1 g in sodium chloride 0.9 % 100 mL IVPB  Status:  Discontinued     1 g 200 mL/hr over 30 Minutes Intravenous Every 24 hours 10/14/19 2335 10/19/19 1233   10/14/19 2230  remdesivir 200 mg in sodium chloride 0.9 % 250 mL IVPB     200 mg 500 mL/hr over 30 Minutes Intravenous Once 10/14/19 2155 10/14/19 2304       Objective: Vitals:   10/21/19 1413 10/21/19  2119 10/22/19 0419 10/22/19 1419  BP: (!) 168/74 (!) 190/77 128/71 (!) 190/97  Pulse: (!) 44 (!) 51 (!) 41 (!) 52  Resp: 20 18 20 20   Temp: (!) 97.3 F (36.3 C) 97.9 F (36.6 C) (!) 97.3 F (36.3 C) 97.7 F (36.5 C)  TempSrc: Oral Oral Oral Oral  SpO2: 93% 93% 95% 96%  Weight:      Height:        Intake/Output Summary (Last 24 hours) at 10/22/2019 1527 Last data filed at 10/22/2019 1521 Gross per 24 hour  Intake 1656.81 ml  Output 2200 ml  Net -543.19 ml   Filed Weights   10/16/19 1147  Weight: 73.2 kg   Weight change:   Body mass index is 26.05 kg/m.  Intake/Output from previous day: 12/03 0701 - 12/04 0700 In: 1280.9 [P.O.:240; I.V.:1040.9] Out: 1301 [Urine:1301] Intake/Output this shift: Total I/O In: 756.8 [P.O.:60; I.V.:696.8] Out: 900 [Urine:900]  Examination:  General exam: Alert,awake oriented at baseline, not in acute distress, weak and frail, on room air  HEENT:Oral mucosa moist, Ear/Nose WNL grossly, dentition normal. Respiratory system: bilaterally clear with no wheezing or crackles. Cardiovascular system: S1 & S2 +, No JVD, regular RR. Gastrointestinal system: Abdomen soft, NT,ND, BS+ Nervous System:Alert, awake, moving extremities and grossly nonfocal Extremities: No edema, distal peripheral pulses palpable.  Skin: No rashes,no icterus. MSK: Normal muscle bulk,tone, power   Medications:  Scheduled Meds: . aspirin EC  81 mg Oral Q M,W,F  . cholecalciferol  1,000 Units Oral Daily  . dexamethasone  6 mg Oral Daily  . enoxaparin (LOVENOX) injection  40 mg Subcutaneous Daily  . escitalopram  10 mg Oral Daily  . feeding supplement (ENSURE ENLIVE)  237 mL Oral TID BM  . fludrocortisone  0.1 mg Oral Daily  . hydrocortisone   Topical BID  . magnesium oxide  400 mg Oral Daily  . midodrine  10 mg Oral TID WC  . multivitamin with minerals  1 tablet Oral Daily  . potassium chloride  20 mEq Oral Daily  . rosuvastatin  10 mg Oral QHS   Continuous  Infusions:   Data Reviewed: I have personally reviewed following labs and imaging studies  CBC: Recent Labs  Lab 10/16/19 0345 10/17/19 0432 10/20/19 0330  WBC 2.9* 5.4 5.1  NEUTROABS 2.2 4.6  --   HGB 12.6* 13.1 14.2  HCT 39.5 40.1 42.0  MCV 94.7 92.6 88.1  PLT 122* 147* 0000000   Basic Metabolic Panel: Recent Labs  Lab 10/16/19 0345 10/17/19 0432 10/20/19 0330 10/22/19 0354  NA 134* 136 136 135  K 4.7 4.6 3.4* 3.2*  CL 109 107 103 103  CO2 16* 19* 21* 21*  GLUCOSE 159* 166* 176* 179*  BUN 29* 29* 33* 31*  CREATININE 1.22 1.19 1.03 0.76  CALCIUM 8.0* 8.2* 8.2* 8.0*  MG 1.8 1.8  --   --    GFR: Estimated Creatinine Clearance: 57.6 mL/min (by C-G formula based on SCr of 0.76 mg/dL). Liver Function Tests: Recent Labs  Lab 10/16/19 0345 10/17/19 0432  AST 26 33  ALT 14 17  ALKPHOS 30* 31*  BILITOT 0.4 0.4  PROT 5.3* 5.4*  ALBUMIN 2.7* 2.7*   No results for input(s): LIPASE, AMYLASE in the last 168 hours. No results for input(s): AMMONIA in the last 168 hours. Coagulation Profile: No results for input(s): INR, PROTIME in the last 168 hours. Cardiac Enzymes: No results for input(s): CKTOTAL, CKMB, CKMBINDEX, TROPONINI in the last 168 hours. BNP (last 3 results) No results for input(s): PROBNP in the last 8760 hours. HbA1C: No results for input(s): HGBA1C in the last 72 hours. CBG: No results for input(s): GLUCAP in the last 168 hours. Lipid Profile: No results for input(s): CHOL, HDL, LDLCALC, TRIG, CHOLHDL, LDLDIRECT in the last 72 hours. Thyroid Function Tests: Recent Labs    10/22/19 0354  TSH 0.261*   Anemia Panel: No results for input(s): VITAMINB12, FOLATE, FERRITIN, TIBC, IRON, RETICCTPCT in the last 72 hours. Sepsis Labs: No results for input(s): PROCALCITON, LATICACIDVEN in the last 168 hours.  Recent Results (from the past 240 hour(s))  Blood Culture (routine x 2)     Status: Abnormal   Collection Time: 10/14/19  8:13 PM   Specimen: BLOOD   Result Value Ref Range Status   Specimen Description   Final    BLOOD LEFT ANTECUBITAL Performed at [facility] [address] [place], [place] [address]    Special Requests   Final    BOTTLES DRAWN AEROBIC AND ANAEROBIC Blood Culture adequate volume Performed at [facility] [address], [place], [place] [address]    Culture  Setup Time   Final    IN BOTH AEROBIC AND ANAEROBIC BOTTLES GRAM POSITIVE COCCI CRITICAL RESULT CALLED TO, READ BACK BY AND VERIFIED WITH: T GREEN  PHARMD 10/15/19 1753 JDW    Culture (A)  Final    STAPHYLOCOCCUS SPECIES (COAGULASE NEGATIVE) THE SIGNIFICANCE OF ISOLATING THIS ORGANISM FROM A SINGLE SET OF BLOOD CULTURES WHEN MULTIPLE SETS ARE DRAWN IS UNCERTAIN. PLEASE NOTIFY THE MICROBIOLOGY DEPARTMENT WITHIN ONE WEEK IF SPECIATION AND SENSITIVITIES ARE REQUIRED. Performed at [facility], [place] [address], [place], [place] [address]    Report Status 10/18/2019 FINAL  Final  Blood Culture (routine x 2)     Status: None   Collection Time: 10/14/19  8:18 PM   Specimen: BLOOD LEFT FOREARM  Result Value Ref Range Status   Specimen Description   Final    BLOOD LEFT FOREARM Performed at [facility], [place] [address], [place], [place] [address]    Special Requests   Final    BOTTLES DRAWN AEROBIC AND ANAEROBIC Blood Culture results may not be optimal due to an excessive volume of blood received in culture bottles Performed at [facility] [address] [place], [place] [address]    Culture   Final    NO GROWTH 5 DAYS Performed at [facility], [place] [address] [place], [place] [address]    Report Status 10/19/2019 FINAL  Final  MRSA PCR Screening     Status: None   Collection Time: 10/16/19  7:41 AM   Specimen: Nasal Mucosa; Nasopharyngeal  Result Value Ref Range Status   MRSA by PCR NEGATIVE NEGATIVE Final    Comment:        The GeneXpert MRSA Assay (FDA approved for NASAL  specimens only), is one component of a comprehensive MRSA colonization surveillance program. It is not intended to diagnose MRSA infection nor to guide or monitor treatment for MRSA infections. Performed at [facility], [place] [address] [place], [place] [address]       Radiology Studies: No results found.    LOS: 8 days   Time spent: More than 50% of that time was spent in counseling and/or coordination of care.  Antonieta Pert, MD [facility]  10/22/2019, 3:27 PM

## 2019-10-22 NOTE — Consult Note (Signed)
Cardiology Consultation:   Patient ID: Victor Clarke; [id number]; 1929-05-10   Admit date: 10/14/2019 Date of Consult: 10/22/2019  Primary Care Provider: Sherald Clarke., MD Primary Cardiologist: Victor Dresser, MD Primary Electrophysiologist:  None  Patient Profile:   Victor Clarke is a 85 y.o. male with a PMH of HTN, HLD, COPD, peripheral neuropathy, currently admitted with COVID-19, who is being seen today for the evaluation of bradycardia at the request of Dr. Lupita Clarke.  History of Present Illness:   Victor Clarke was admitted to the hospital 10/14/2019, after presenting with fever, cough, and lightheadedness. He tested positive for COVID-19 and was found to have bilateral infiltrates on CXR. He ws treated with IV antibiotics, remdesivir, and decadron. His hospital course has been complicated by orthostatic hypotension, for which he was started on florinef and midodrine. He has also been experiencing bradycardia. EKG this morning with sinus bradycardia, rate 47 bpm, with PACs, no STE/D, no TWI. He has a 1st degree AV block at baseline and is not on any AV nodal blocking agents at this time or prior to this admission. Cardiology was asked to evaluate.   He was last seen by cardiology at an outpatient visit with Victor Clarke 06/2018 in follow-up of recent hospitalization for syncope which was felt to be 2/2 dehydration. He was without recurrent syncope at that time but reported occasional lightheadedness. He was recommended to undergo a 2 week monitor, however patient declined and was lost to follow-up. Echo this admission showed EF 60-65%, moderate MAC with mild MR, mild AS, and mild LAE. No prior ischemic evaluation, though has been noted to have coronary artery calcifications and aortic atherosclerosis on CT Chest.   Past Medical History:  Diagnosis Date  . Alcoholic peripheral neuropathy ([place])   . Allergy   . Anemia   . Arthritis   . Cataract    left eye cataract removed   . COPD (chronic obstructive pulmonary disease) ([place])    pt denies 02-03-17  . Dehydration   . Detached retina    a. s/p surgical correction on the right.  . Ectopic atrial tachycardia ([place])    a. 08/2014 Echo: EF 55-60%, mildly dil LA.  Marland Kitchen EtOH dependence ([place])   . Fall   . Falls frequently 11/2018  . Gout   . Gout   . Hypercholesteremia   . Hypertension   . Left inguinal hernia    a. s/p repair ~ 10 yrs ago.  . Malnutrition ([place])   . Orthostatic hypotension   . Parkinson's disease ([place])   . Prostate cancer ([place])    a. s/p TURP.    Past Surgical History:  Procedure Laterality Date  . CATARACT EXTRACTION     on eye, possibly right eye  . COLONOSCOPY    . COLONOSCOPY W/ POLYPECTOMY  2001, 2006  . HERNIA REPAIR    . PROSTATE SURGERY       Home Medications:  Prior to Admission medications   Medication Sig Start Date End Date Taking? Authorizing Provider  amLODipine-benazepril (LOTREL) 5-20 MG capsule Take 1 capsule by mouth daily.   Yes [provider]  aspirin EC 81 MG tablet Take 81 mg by mouth every Monday, Wednesday, and Friday.    Yes [provider]  cholecalciferol (VITAMIN D3) 25 MCG (1000 UT) tablet Take 1,000 Units by mouth daily.   Yes [provider]  [employer] Extract 2 % SHAM Apply 1 application topically 2 (two) times a week.  Yes [provider]  escitalopram (LEXAPRO) 10 MG tablet Take 10 mg by mouth daily.   Yes [provider]  Magnesium Oxide 250 MG TABS Take 500 mg by mouth daily.   Yes [provider]  miconazole (MICOTIN) 2 % cream Apply 1 application topically daily as needed (rash, jock itch).   Yes [provider]  Multiple Vitamin (MULTIVITAMIN) tablet Take 1 tablet by mouth daily.   Yes [provider]  nystatin cream (MYCOSTATIN) Apply topically 2 (two) times daily. Apply on bilateral groins Patient taking differently: Apply 1 application topically 2 (two) times daily as needed  for dry skin. Apply on bilateral groins 12/09/18  Yes Adhikari, Tamsen Meek, MD  nystatin-triamcinolone (MYCOLOG II) cream Apply 1 application topically 2 (two) times daily.   Yes [provider]  rosuvastatin (CRESTOR) 10 MG tablet Take 10 mg by mouth at bedtime.  03/17/18  Yes [provider]  triamcinolone cream (KENALOG) 0.1 % Apply 1 application topically 2 (two) times daily.   Yes [provider]  VITAMIN D PO Take 1,000 Units by mouth daily.   Yes [provider]  albuterol (VENTOLIN HFA) 108 (90 Base) MCG/ACT inhaler Inhale 2 puffs into the lungs 3 (three) times daily. 10/18/19   Manuella Ghazi, Pratik D, DO  ALPRAZolam Duanne Moron) 0.25 MG tablet Take 0.5 tablets (0.125 mg total) by mouth 2 (two) times daily. 10/18/19   Manuella Ghazi, Pratik D, DO  dexamethasone (DECADRON) 6 MG tablet Take 1 tablet (6 mg total) by mouth daily for 5 days. 10/19/19 10/24/19  Manuella Ghazi, Pratik D, DO  guaiFENesin-dextromethorphan (ROBITUSSIN DM) 100-10 MG/5ML syrup Take 10 mLs by mouth every 4 (four) hours as needed for cough. 10/18/19   Heath Lark D, DO    Inpatient Medications: Scheduled Meds: . aspirin EC  81 mg Oral Q M,W,F  . cholecalciferol  1,000 Units Oral Daily  . dexamethasone  6 mg Oral Daily  . enoxaparin (LOVENOX) injection  40 mg Subcutaneous Daily  . escitalopram  10 mg Oral Daily  . feeding supplement (ENSURE ENLIVE)  237 mL Oral TID BM  . fludrocortisone  0.1 mg Oral Daily  . hydrocortisone   Topical BID  . magnesium oxide  400 mg Oral Daily  . midodrine  10 mg Oral TID WC  . multivitamin with minerals  1 tablet Oral Daily  . potassium chloride  20 mEq Oral Daily  . rosuvastatin  10 mg Oral QHS   Continuous Infusions: . sodium chloride 75 mL/hr at 10/22/19 0523   PRN Meds: acetaminophen, albuterol, chlorpheniramine-HYDROcodone, guaiFENesin-dextromethorphan, lip balm, ondansetron **OR** ondansetron (ZOFRAN) IV  Allergies:   No Known Allergies  Social History:   Social History    Socioeconomic History  . Marital status: Widowed    Spouse name: Not on file  . Number of children: 0  . Years of education: College  . Highest education level: Not on file  Occupational History  . Occupation: Retired    Fish farm manager: OTHER  Social Needs  . Financial resource strain: Not on file  . Food insecurity    Worry: Not on file    Inability: Not on file  . Transportation needs    Medical: Not on file    Non-medical: Not on file  Tobacco Use  . Smoking status: Former Smoker    Packs/day: 2.00    Years: 22.00    Pack years: 44.00    Types: Cigarettes    Quit date: 11/18/1977    Years since quitting:  41.9  . Smokeless tobacco: Never Used  Substance and Sexual Activity  . Alcohol use: No    Alcohol/week: 2.0 standard drinks    Types: 1 Shots of liquor, 1 Standard drinks or equivalent per week    Comment: update 01/05/2019 none in past month   . Drug use: No  . Sexual activity: Never  Lifestyle  . Physical activity    Days per week: Not on file    Minutes per session: Not on file  . Stress: Not on file  Relationships  . Social Herbalist on phone: Not on file    Gets together: Not on file    Attends religious service: Not on file    Active member of club or organization: Not on file    Attends meetings of clubs or organizations: Not on file    Relationship status: Not on file  . Intimate partner violence    Fear of current or ex partner: Not on file    Emotionally abused: Not on file    Physically abused: Not on file    Forced sexual activity: Not on file  Other Topics Concern  . Not on file  Social History Narrative   Patient lives at an assisted living facility, [facility].  Has no children.     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     Education: college degree.   Caffeine Use: 1 cup occasionally    Family History:    Family History  Problem Relation Age of Onset  . Stroke Father        died @ 64.  Marland Kitchen Heart attack  Mother        died @ 30  . Stroke Brother        died in his 29's  . Heart attack Brother        died in his 10's  . Colon cancer Neg Hx   . Esophageal cancer Neg Hx   . Rectal cancer Neg Hx   . Stomach cancer Neg Hx   . Prostate cancer Neg Hx      ROS:  Please see the history of present illness.  ROS  All other ROS reviewed and negative.     Physical Exam/Data:   Vitals:   10/21/19 0944 10/21/19 1413 10/21/19 2119 10/22/19 0419  BP:  (!) 168/74 (!) 190/77 128/71  Pulse:  (!) 44 (!) 51 (!) 41  Resp:  _0 Temp:  (!) 97.3 F (36.3 C) 97.9 F (36.6 C) (!) 97.3 F (36.3 C)  TempSrc:  Oral Oral Oral  SpO2: 95% 93% 93% 95%  Weight:      Height:        Intake/Output Summary (Last 24 hours) at 10/22/2019 0949 Last data filed at 10/22/2019 0900 Gross per 24 hour  Intake 960 ml  Output 1301 ml  Net -341 ml   Filed Weights   10/16/19 1147  Weight: 73.2 kg   Body mass index is 26.05 kg/m.  Physical exam per MD below  EKG:  The EKG was personally reviewed and demonstrates:  sinus bradycardia, rate 47 bpm, with PACs, no STE/D, no TWI. Telemetry:  Telemetry was personally reviewed and demonstrates:  Sinus rhythm/sinus bradycardia with 1st degree AV block; no evidence of high grade AV block on review.  Relevant CV Studies: Echocardiogram 10/19/2019: IMPRESSIONS    1. Left ventricular ejection fraction, by visual estimation, is 60 to 65%. The left  ventricle has normal function. There is no left ventricular hypertrophy.  2. Global right ventricle has normal systolic function.The right ventricular size is normal. No increase in right ventricular wall thickness.  3. Left atrial size was mildly dilated.  4. Right atrial size was normal.  5. Moderate mitral annular calcification.  6. The mitral valve is normal in structure. Mild mitral valve regurgitation. No evidence of mitral stenosis.  7. The tricuspid valve is normal in structure. Tricuspid valve regurgitation is  not demonstrated.  8. The aortic valve was not well visualized. Aortic valve regurgitation is not visualized. leaflets calcified with some restricted motion Poor CW signal likely mild AS.  9. The pulmonic valve was normal in structure. Pulmonic valve regurgitation is not visualized. 10. The aortic root was not well visualized. 11. Normal pulmonary artery systolic pressure. 12. The inferior vena cava is normal in size with greater than 50% respiratory variability, suggesting right atrial pressure of 3 mmHg  Laboratory Data:  Chemistry Recent Labs  Lab 10/17/19 0432 10/20/19 0330 10/22/19 0354  NA 136 136 135  K 4.6 3.4* 3.2*  CL 107 103 103  CO2 19* 21* 21*  GLUCOSE 166* 176* 179*  BUN 29* 33* 31*  CREATININE 1.19 1.03 0.76  CALCIUM 8.2* 8.2* 8.0*  GFRNONAA 54* >60 >60  GFRAA >60 >60 >60  ANIONGAP _0 Recent Labs  Lab 10/16/19 0345 10/17/19 0432  PROT 5.3* 5.4*  ALBUMIN 2.7* 2.7*  AST 26 33  ALT 14 17  ALKPHOS 30* 31*  BILITOT 0.4 0.4   Hematology Recent Labs  Lab 10/16/19 0345 10/17/19 0432 10/20/19 0330  WBC 2.9* 5.4 5.1  RBC 4.17* 4.33 4.77  HGB 12.6* 13.1 14.2  HCT 39.5 40.1 42.0  MCV 94.7 92.6 88.1  MCH 30.2 30.3 29.8  MCHC 31.9 32.7 33.8  RDW 13.6 13.4 13.1  PLT 122* 147* 181   Cardiac EnzymesNo results for input(s): TROPONINI in the last 168 hours. No results for input(s): TROPIPOC in the last 168 hours.  BNPNo results for input(s): BNP, PROBNP in the last 168 hours.  DDimer No results for input(s): DDIMER in the last 168 hours.  Radiology/Studies:  No results found.  Assessment and Plan:   1. Orthostatic hypotension: noted throughout admission. Started on florinef and midodrine. Orthostatics still positive today. AM cortisol level was low - possible this is related to adrenal insufficiency - Can continue florinef and midodrine - Consider cortisol stim test to further evaluate for adrenal insufficiency - will defer to primary team.  -  Supportive care with compression stockings and abdominal binder - Caution with position changes  2. Bradycardia: patient has sinus bradycardia with 1st degree AV block at baseline. No evidence of high grade AV block. Was recommended for an outpatient zio patch monitor in the past, however failed to follow-up. - Will follow-up outpatient after recovery of present illness.  - Likely undergo outpatient cardiac monitor with a 2 week zio patch to further evaluate bradycardia  3. COVID-19 PNA: patient presented with fever, cough, lightheadedness, and weakness. COVID-19 + 10/14/2019. CXR with bilateral infiltrates. He completed a course of IV antibiotics and remdesivir. Currently on po steroids and supportive care.  - Continue management per primary team.  4. HTN: home medications on hold given #1. BP generally elevated after starting florinef and midodrine, though allowing permissive hypertension in the setting of #1.  - Continue to monitor.    For questions or updates, please contact [facility] Please  consult [email or web address] for contact info under Cardiology/STEMI.   Signed, Abigail Butts, PA-C  10/22/2019 9:49 AM [phone]

## 2019-10-23 MED ORDER — POTASSIUM CHLORIDE CRYS ER 20 MEQ PO TBCR
40.0000 meq | EXTENDED_RELEASE_TABLET | Freq: Once | ORAL | Status: AC
  Administered 2019-10-23: 40 meq via ORAL
  Filled 2019-10-23: qty 2

## 2019-10-23 MED ORDER — SODIUM CHLORIDE 0.9 % IV SOLN
INTRAVENOUS | Status: AC
  Administered 2019-10-23: 13:00:00 via INTRAVENOUS

## 2019-10-23 MED ORDER — LOPERAMIDE HCL 2 MG PO CAPS: 2.0000 mg | ORAL_CAPSULE | Freq: Four times a day (QID) | ORAL | Status: DC | PRN

## 2019-10-23 NOTE — Progress Notes (Signed)
PROGRESS NOTE    Victor Clarke  [id number] DOB: [birth year] DOA: 10/14/2019 PCP: Sherald Hess., MD   Brief Narrative:  As per previous review:85 year old with peripheral neuropathy, copd, hypertension, hyperlipidemia from ALF sent to the emergency room with fever, cough and recently tested COVID-19. Patient's only complaint was lightheadedness, fatigue and lightheadedness. In the emergency room, he was hemodynamically stable. His creatinine was slightly elevated from baseline. Chest x-ray showed bilateral infiltrates. COVID-19 test was positive. Emergency room reportedthat he was 88% on ambulation. He has mostly remained on room air.  12/1:He was thought to be ready for discharge on 11/30, however he continued to have persistent orthostatic hypotension and was started on some midodrine and also given a fluid bolus.  Despite these interventions, he continues to have persistent orthostasis and is asymptomatic with ambulation despite his blood pressures being quite low.started him on Florinef and maintained on midodrine. Discontinued Xanax as well since this can be contributing to his symptoms. ordered 2D echocardiogram as prior was done in 2019.   Continues to be orthostatic and symptomatic, also bradycardic. Cardi consulted  Subjective:  Seen this morning patient was getting up to the bedside commode with nursing take and was hypotensive in 60s and symptomatic with brief episode of decreased responsiveness, no confusion no seizure-nursing reported similar episode yesterday morning.   Patient denies any symptoms.   Having somewhat loose stool.  Assessment & Plan:   Pneumonia due to COVID-19 virus:Patient finished the course of  remdesivir antibiotics and on p.o. Decadron. No respiratory symptoms and doing well on RA.   Orthostatic hypotension:Patient remains orthostatic and symptomatic.  Appreciate cardiology input, will hold off on Florinef while he is on Decadron,  midodrine is adjusted to 5 mg 3 times daily due to supine hypertension.  We will keep him on gentle IV fluid hydration x12 hrs, given some loose stool.continue Imodium. Cont on Lower leg stockings.Cortisol suppressed likely from being on steroid, less likely adrenal insufficiency, discussed with pharmacy - given he has been on steroid for several days Cosyntropin test less likely of value and less adrenal issues since he is on a steroid already.Echocardiogram shows normal EF with no acute finding.He may essentially be bed bound if it is does not improve, wonder if it due to his ?parkinson's/autonomic dysregulation. Repeat cortisol in am.  Appreciate cardiology input in this matter. I was able to get hold of [name]- she tells me taht pt may have had simialr issues and that's why not abel to drive. We discussed abt different treatment done here and if it does not improve pt will need to be on bed to avoid syncope/fall and injury- they agree, also spoke w his brother who was with her.  Sinus Bradycardia -in 40s- ongoing. HR up to 60s on standing.  Seen by cardiology.  Abnormal thyroid function test with TSH suppressed  nd free T4 slightly up-suspect due to acute illness, not clinical evidence of hyperthyroidism in fact has been bradycardic.  Repeat TSH.FT4 in 2-3 wels  Hypokalemia repleting.  HTN-meds on hold given orthostatic hypotension. Had supine hypertension, use prn meds  Hypercholesteremia: On Crestor  AKI resolved  Hyponatremia: Resolved.  Cognitive impairment with early dementia/questionable Parkinson's: family not aware:Continue home Lexapro.  Supportive care.  Body mass index is 26.05 kg/m.   DVT prophylaxis:lovenox Code Status: DNR Family Communication: plan of care discussed with patient at bedside.  Unable to contact his brother on phone. 12/4- able to contact his niece and  also his brother Disposition Plan: Remains inpatient pending improvement in patient's orthostatic  vitals.  Consultants: none Procedures: none Echocardiogram 12/1  1. Left ventricular ejection fraction, by visual estimation, is 60 to 65%. The left ventricle has normal function. There is no left ventricular hypertrophy.  2. Global right ventricle has normal systolic function.The right ventricular size is normal. No increase in right ventricular wall thickness.  3. Left atrial size was mildly dilated.  4. Right atrial size was normal.  5. Moderate mitral annular calcification.  6. The mitral valve is normal in structure. Mild mitral valve regurgitation. No evidence of mitral stenosis.  7. The tricuspid valve is normal in structure. Tricuspid valve regurgitation is not demonstrated.  8. The aortic valve was not well visualized. Aortic valve regurgitation is not visualized. leaflets calcified with some restricted motion Poor CW signal likely mild AS.  9. The pulmonic valve was normal in structure. Pulmonic valve regurgitation is not visualized. 10. The aortic root was not well visualized. 11. Normal pulmonary artery systolic pressure. 12. The inferior vena cava is normal in size with greater than 50% respiratory variability, suggesting right atrial pressure of 3 mmHg.  Microbiology: coag neg staph in one set- contaminant  Antimicrobials: Anti-infectives (From admission, onward)   Start     Dose/Rate Route Frequency Ordered Stop   10/18/19 0000  azithromycin (ZITHROMAX) 250 MG tablet     250 mg Oral  Once 10/18/19 0918 10/18/19 2359   10/17/19 2200  azithromycin (ZITHROMAX) tablet 500 mg  Status:  Discontinued     500 mg Oral Daily at bedtime 10/17/19 1025 10/19/19 1233   10/15/19 1800  remdesivir 100 mg in sodium chloride 0.9 % 250 mL IVPB     100 mg 500 mL/hr over 30 Minutes Intravenous Every 24 hours 10/14/19 2156 10/18/19 2110   10/14/19 2345  azithromycin (ZITHROMAX) 500 mg in sodium chloride 0.9 % 250 mL IVPB  Status:  Discontinued     500 mg 250 mL/hr over 60 Minutes Intravenous  Every 24 hours 10/14/19 2335 10/17/19 1025   10/14/19 2345  cefTRIAXone (ROCEPHIN) 1 g in sodium chloride 0.9 % 100 mL IVPB  Status:  Discontinued     1 g 200 mL/hr over 30 Minutes Intravenous Every 24 hours 10/14/19 2335 10/19/19 1233   10/14/19 2230  remdesivir 200 mg in sodium chloride 0.9 % 250 mL IVPB     200 mg 500 mL/hr over 30 Minutes Intravenous Once 10/14/19 2155 10/14/19 2304       Objective: Vitals:   10/22/19 1632 10/22/19 2108 10/23/19 0544 10/23/19 0933  BP: (!) 172/82 (!) 160/79 136/63 (!) 62/49  Pulse: (!) 51 75 (!) 51 65  Resp:  17 20   Temp:  97.6 F (36.4 C) 97.9 F (36.6 C)   TempSrc:  Oral Oral   SpO2:  94% 93% 96%  Weight:      Height:        Intake/Output Summary (Last 24 hours) at 10/23/2019 1157 Last data filed at 10/23/2019 0600 Gross per 24 hour  Intake 936.81 ml  Output 2700 ml  Net -1763.19 ml   Filed Weights   10/16/19 1147  Weight: 73.2 kg   Weight change:   Body mass index is 26.05 kg/m.  Intake/Output from previous day: 12/04 0701 - 12/05 0700 In: 996.8 [P.O.:300; I.V.:696.8] Out: 2700 [Urine:2700] Intake/Output this shift: No intake/output data recorded.  Examination:  General exam: Alert, awake oriented appears weak and frail, on room air.  HEENT:Oral mucosa moist, Ear/Nose WNL grossly, dentition normal. Respiratory system: Bilaterally clear breath sounds no wheezing or crackles.   Cardiovascular system: S1 & S2 +, No JVD, regular RR. Gastrointestinal system: Abdomen soft, NT,ND, BS+ Nervous System:Alert, awake, oriented, moving all his extremities and nonfocal.  Extremities: No leg edema, distal pulses intact. Skin: No rashes,no icterus. MSK: Normal muscle bulk,tone, power.  Medications:  Scheduled Meds: . aspirin EC  81 mg Oral Q M,W,F  . cholecalciferol  1,000 Units Oral Daily  . enoxaparin (LOVENOX) injection  40 mg Subcutaneous Daily  . escitalopram  10 mg Oral Daily  . feeding supplement (ENSURE ENLIVE)  237 mL  Oral TID BM  . fludrocortisone  0.1 mg Oral Daily  . hydrocortisone   Topical BID  . magnesium oxide  400 mg Oral Daily  . midodrine  5 mg Oral TID WC  . multivitamin with minerals  1 tablet Oral Daily  . potassium chloride  20 mEq Oral Daily  . rosuvastatin  10 mg Oral QHS   Continuous Infusions:   Data Reviewed: I have personally reviewed following labs and imaging studies  CBC: Recent Labs  Lab 10/17/19 0432 10/20/19 0330  WBC 5.4 5.1  NEUTROABS 4.6  --   HGB 13.1 14.2  HCT 40.1 42.0  MCV 92.6 88.1  PLT 147* 0000000   Basic Metabolic Panel: Recent Labs  Lab 10/17/19 0432 10/20/19 0330 10/22/19 0354  NA 136 136 135  K 4.6 3.4* 3.2*  CL 107 103 103  CO2 19* 21* 21*  GLUCOSE 166* 176* 179*  BUN 29* 33* 31*  CREATININE 1.19 1.03 0.76  CALCIUM 8.2* 8.2* 8.0*  MG 1.8  --   --    GFR: Estimated Creatinine Clearance: 57.6 mL/min (by C-G formula based on SCr of 0.76 mg/dL). Liver Function Tests: Recent Labs  Lab 10/17/19 0432  AST 33  ALT 17  ALKPHOS 31*  BILITOT 0.4  PROT 5.4*  ALBUMIN 2.7*   No results for input(s): LIPASE, AMYLASE in the last 168 hours. No results for input(s): AMMONIA in the last 168 hours. Coagulation Profile: No results for input(s): INR, PROTIME in the last 168 hours. Cardiac Enzymes: No results for input(s): CKTOTAL, CKMB, CKMBINDEX, TROPONINI in the last 168 hours. BNP (last 3 results) No results for input(s): PROBNP in the last 8760 hours. HbA1C: No results for input(s): HGBA1C in the last 72 hours. CBG: No results for input(s): GLUCAP in the last 168 hours. Lipid Profile: No results for input(s): CHOL, HDL, LDLCALC, TRIG, CHOLHDL, LDLDIRECT in the last 72 hours. Thyroid Function Tests: Recent Labs    10/22/19 0354  TSH 0.261*  FREET4 1.18*   Anemia Panel: No results for input(s): VITAMINB12, FOLATE, FERRITIN, TIBC, IRON, RETICCTPCT in the last 72 hours. Sepsis Labs: No results for input(s): PROCALCITON, LATICACIDVEN in  the last 168 hours.  Recent Results (from the past 240 hour(s))  Blood Culture (routine x 2)     Status: Abnormal   Collection Time: 10/14/19  8:13 PM   Specimen: BLOOD  Result Value Ref Range Status   Specimen Description   Final    BLOOD LEFT ANTECUBITAL Performed at [facility] [address] [place], [place] [address]    Special Requests   Final    BOTTLES DRAWN AEROBIC AND ANAEROBIC Blood Culture adequate volume Performed at [facility] [address] [place], [place] [address]    Culture  Setup Time   Final  IN BOTH AEROBIC AND ANAEROBIC BOTTLES GRAM POSITIVE COCCI CRITICAL RESULT CALLED TO, READ BACK BY AND VERIFIED WITH: T GREEN PHARMD 10/15/19 1753 JDW    Culture (A)  Final    STAPHYLOCOCCUS SPECIES (COAGULASE NEGATIVE) THE SIGNIFICANCE OF ISOLATING THIS ORGANISM FROM A SINGLE SET OF BLOOD CULTURES WHEN MULTIPLE SETS ARE DRAWN IS UNCERTAIN. PLEASE NOTIFY THE MICROBIOLOGY DEPARTMENT WITHIN ONE WEEK IF SPECIATION AND SENSITIVITIES ARE REQUIRED. Performed at [facility], [place] [address], [place], [place] [address]    Report Status 10/18/2019 FINAL  Final  Blood Culture (routine x 2)     Status: None   Collection Time: 10/14/19  8:18 PM   Specimen: BLOOD LEFT FOREARM  Result Value Ref Range Status   Specimen Description   Final    BLOOD LEFT FOREARM Performed at [facility], [place] [address] [place], [place] [address]    Special Requests   Final    BOTTLES DRAWN AEROBIC AND ANAEROBIC Blood Culture results may not be optimal due to an excessive volume of blood received in culture bottles Performed at [facility] [address] [place], [place] [address]    Culture   Final    NO GROWTH 5 DAYS Performed at [facility], [place] [address] [place], [place] [address]    Report Status 10/19/2019 FINAL  Final  MRSA PCR Screening     Status: None   Collection Time: 10/16/19  7:41 AM   Specimen:  Nasal Mucosa; Nasopharyngeal  Result Value Ref Range Status   MRSA by PCR NEGATIVE NEGATIVE Final    Comment:        The GeneXpert MRSA Assay (FDA approved for NASAL specimens only), is one component of a comprehensive MRSA colonization surveillance program. It is not intended to diagnose MRSA infection nor to guide or monitor treatment for MRSA infections. Performed at [facility], [place] [address] [place], [place] [address]       Radiology Studies: No results found.    LOS: 9 days   Time spent: More than 50% of that time was spent in counseling and/or coordination of care.  Antonieta Pert, MD [facility]  10/23/2019, 11:57 AM

## 2019-10-24 LAB — BASIC METABOLIC PANEL
Anion gap: 12 (ref 5–15)
BUN: 38 mg/dL — ABNORMAL HIGH (ref 8–23)
CO2: 20 mmol/L — ABNORMAL LOW (ref 22–32)
Calcium: 8.5 mg/dL — ABNORMAL LOW (ref 8.9–10.3)
Chloride: 104 mmol/L (ref 98–111)
Creatinine, Ser: 0.99 mg/dL (ref 0.61–1.24)
GFR calc Af Amer: >60 mL/min (ref >60)
GFR calc non Af Amer: >60 mL/min (ref >60)
Glucose, Bld: 182 mg/dL — ABNORMAL HIGH (ref 70–99)
Potassium: 4.4 mmol/L (ref 3.5–5.1)
Sodium: 136 mmol/L (ref 135–145)

## 2019-10-24 LAB — CBC
HCT: 43.3 % (ref 39.0–52.0)
Hemoglobin: 14.5 g/dL (ref 13.0–17.0)
MCH: 29.7 pg (ref 26.0–34.0)
MCHC: 33.5 g/dL (ref 30.0–36.0)
MCV: 88.7 fL (ref 80.0–100.0)
Platelets: 230 10*3/uL (ref 150–400)
RBC: 4.88 MIL/uL (ref 4.22–5.81)
RDW: 13.6 % (ref 11.5–15.5)
WBC: 8.9 10*3/uL (ref 4.0–10.5)
nRBC: 0 % (ref 0.0–0.2)

## 2019-10-24 LAB — CORTISOL: Cortisol, Plasma: 1.5 ug/dL

## 2019-10-24 MED ORDER — FLUDROCORTISONE ACETATE 0.1 MG PO TABS
0.1000 mg | ORAL_TABLET | Freq: Every day | ORAL | Status: DC
  Administered 2019-10-24 – 2019-10-26 (×3): 0.1 mg via ORAL
  Filled 2019-10-24 (×3): qty 1

## 2019-10-24 NOTE — Progress Notes (Signed)
PROGRESS NOTE    Victor Clarke  [id number] DOB: 08-Mar-1929 DOA: 10/14/2019 PCP: Sherald Hess., MD   Brief Narrative:  As per previous review:85 year old with peripheral neuropathy, copd, hypertension, hyperlipidemia from ALF sent to the emergency room with fever, cough and recently tested COVID-19. Patient's only complaint was lightheadedness, fatigue and lightheadedness. In the emergency room, he was hemodynamically stable. His creatinine was slightly elevated from baseline. Chest x-ray showed bilateral infiltrates. COVID-19 test was positive. Emergency room reportedthat he was 88% on ambulation. He has mostly remained on room air.  12/1:He was thought to be ready for discharge on 11/30, however he continued to have persistent orthostatic hypotension and was started on some midodrine and also given a fluid bolus.  Despite these interventions, he continues to have persistent orthostasis and is asymptomatic with ambulation despite his blood pressures being quite low.started him on Florinef and maintained on midodrine. Discontinued Xanax as well since this can be contributing to his symptoms. ordered 2D echocardiogram as prior was done in 2019.   Continues to be orthostatic and symptomatic, also bradycardic. Cardio consulted and no further inpatient recommendation.  Subjective:  Seen this morning. No acute events overnight.  Patient denies nausea vomiting.  No bowel movement today.  Blood pressure doing well no supine hypertension  Assessment & Plan:   Pneumonia due to COVID-19 virus:Patient finished the course of  remdesivir antibiotics a nd Decadron. No respiratory symptoms and doing well on RA.   Orthostatic hypotension:Patient remains orthostatic and symptomatic. Appreciate cardiology input.  This TSH is low but already on Decadron for 10 days likely suppressed from decadron and cosyntropin test will likely be of questionable value, completed Decadron 12/5 so we will  put him on Florinef starting 12/6. He is on midodrine will continue with holding parameters to avoid supine hypertension. Echocardiogram shows normal EF with no acute finding.He may essentially be bed bound if it is does not improve, wonder if it due to his ?parkinson's/autonomic dysregulation.Appreciate cardiology input in this matter. I was able to get hold of [name]- she tells me that pt may have had simialr issues and that's why not able to drive. We discussed abt different treatment modalities.  Continue above treatment, continue bedrest, close supervision for mobility. I discussed with the patient he understands and agreeable with the current plan  Sinus Bradycardia -in 40s- ongoing. HR up to 60s on standing.  Seen by cardiology.  No high-grade AV block.  Abnormal thyroid function test with TSH suppressed  and free T4 slightly up-suspect due to acute illness, not clinical evidence of hyperthyroidism in fact has been bradycardic.  Repeat TSH.FT4 in 2-3 weeks  Hypokalemia repleted.  HTN-meds on hold given orthostatic hypotension. Had supine hypertension, use prn meds. On midodrine.  Hypercholesteremia: On Crestor  AKI resolved  Hyponatremia: Resolved.  Cognitive impairment with early dementia/questionable Parkinson's: family not aware:Continue home Lexapro.  Supportive care.  Body mass index is 26.05 kg/m.   DVT prophylaxis:lovenox Code Status: DNR Family Communication: plan of care discussed with patient at bedside.  I discussed with patient brother and niece and plan is to return to a skilled nursing facility Disposition Plan: Anticipate discharge tomorrow if remains clinically stable   Consultants: none Procedures: none Echocardiogram 12/1  1. Left ventricular ejection fraction, by visual estimation, is 60 to 65%. The left ventricle has normal function. There is no left ventricular hypertrophy.  2. Global right ventricle has normal systolic function.The right  ventricular size is normal.  No increase in right ventricular wall thickness.  3. Left atrial size was mildly dilated.  4. Right atrial size was normal.  5. Moderate mitral annular calcification.  6. The mitral valve is normal in structure. Mild mitral valve regurgitation. No evidence of mitral stenosis.  7. The tricuspid valve is normal in structure. Tricuspid valve regurgitation is not demonstrated.  8. The aortic valve was not well visualized. Aortic valve regurgitation is not visualized. leaflets calcified with some restricted motion Poor CW signal likely mild AS.  9. The pulmonic valve was normal in structure. Pulmonic valve regurgitation is not visualized. 10. The aortic root was not well visualized. 11. Normal pulmonary artery systolic pressure. 12. The inferior vena cava is normal in size with greater than 50% respiratory variability, suggesting right atrial pressure of 3 mmHg.  Microbiology: coag neg staph in one set- contaminant  Antimicrobials: Anti-infectives (From admission, onward)   Start     Dose/Rate Route Frequency Ordered Stop   10/18/19 0000  azithromycin (ZITHROMAX) 250 MG tablet     250 mg Oral  Once 10/18/19 0918 10/18/19 2359   10/17/19 2200  azithromycin (ZITHROMAX) tablet 500 mg  Status:  Discontinued     500 mg Oral Daily at bedtime 10/17/19 1025 10/19/19 1233   10/15/19 1800  remdesivir 100 mg in sodium chloride 0.9 % 250 mL IVPB     100 mg 500 mL/hr over 30 Minutes Intravenous Every 24 hours 10/14/19 2156 10/18/19 2110   10/14/19 2345  azithromycin (ZITHROMAX) 500 mg in sodium chloride 0.9 % 250 mL IVPB  Status:  Discontinued     500 mg 250 mL/hr over 60 Minutes Intravenous Every 24 hours 10/14/19 2335 10/17/19 1025   10/14/19 2345  cefTRIAXone (ROCEPHIN) 1 g in sodium chloride 0.9 % 100 mL IVPB  Status:  Discontinued     1 g 200 mL/hr over 30 Minutes Intravenous Every 24 hours 10/14/19 2335 10/19/19 1233   10/14/19 2230  remdesivir 200 mg in sodium chloride  0.9 % 250 mL IVPB     200 mg 500 mL/hr over 30 Minutes Intravenous Once 10/14/19 2155 10/14/19 2304       Objective: Vitals:   10/23/19 1622 10/23/19 2141 10/24/19 0541 10/24/19 1155  BP: (!) 178/79 (!) 161/85 (!) 153/73 118/60  Pulse: (!) 50 (!) 47 88 (!) 51  Resp:  20 20 15   Temp:  97.7 F (36.5 C) 97.6 F (36.4 C) 97.7 F (36.5 C)  TempSrc:  Oral Oral Oral  SpO2:  94% 99% 94%  Weight:      Height:        Intake/Output Summary (Last 24 hours) at 10/24/2019 1511 Last data filed at 10/24/2019 1100 Gross per 24 hour  Intake 636.84 ml  Output 925 ml  Net -288.16 ml   Filed Weights   10/16/19 1147  Weight: 73.2 kg   Weight change:   Body mass index is 26.05 kg/m.  Intake/Output from previous day: 12/05 0701 - 12/06 0700 In: 763.5 [P.O.:120; I.V.:643.5] Out: 925 [Urine:925] Intake/Output this shift: No intake/output data recorded.  Examination:  General exam: Alert awake oriented at baseline, weak and frail, on room air.  HEENT:Oral mucosa moist, Ear/Nose WNL grossly, dentition normal. Respiratory system: Clear Breath Sounds No Wheezing or Crackles.   Cardiovascular system: S1 & S2 +, No JVD, regular RR. Gastrointestinal system: Abdomen soft, NT,ND, BS+ Nervous System:Alert, awake, oriented, moving all his extremities and nonfocal.  Extremities: No leg edema, distal pulses intact. Skin: No  rashes,no icterus. MSK: Normal muscle bulk,tone, power.  Medications:  Scheduled Meds: . aspirin EC  81 mg Oral Q M,W,F  . cholecalciferol  1,000 Units Oral Daily  . enoxaparin (LOVENOX) injection  40 mg Subcutaneous Daily  . escitalopram  10 mg Oral Daily  . feeding supplement (ENSURE ENLIVE)  237 mL Oral TID BM  . fludrocortisone  0.1 mg Oral Daily  . hydrocortisone   Topical BID  . magnesium oxide  400 mg Oral Daily  . midodrine  5 mg Oral TID WC  . multivitamin with minerals  1 tablet Oral Daily  . potassium chloride  20 mEq Oral Daily  . rosuvastatin  10 mg Oral  QHS   Continuous Infusions:   Data Reviewed: I have personally reviewed following labs and imaging studies  CBC: Recent Labs  Lab 10/20/19 0330 10/24/19 0424  WBC 5.1 8.9  HGB 14.2 14.5  HCT 42.0 43.3  MCV 88.1 88.7  PLT 181 [id number]   Basic Metabolic Panel: Recent Labs  Lab 10/20/19 0330 10/22/19 0354 10/24/19 0424  NA 136 135 136  K 3.4* 3.2* 4.4  CL 103 103 104  CO2 21* 21* 20*  GLUCOSE 176* 179* 182*  BUN 33* 31* 38*  CREATININE 1.03 0.76 0.99  CALCIUM 8.2* 8.0* 8.5*   GFR: Estimated Creatinine Clearance: 46.5 mL/min (by C-G formula based on SCr of 0.99 mg/dL). Liver Function Tests: No results for input(s): AST, ALT, ALKPHOS, BILITOT, PROT, ALBUMIN in the last 168 hours. No results for input(s): LIPASE, AMYLASE in the last 168 hours. No results for input(s): AMMONIA in the last 168 hours. Coagulation Profile: No results for input(s): INR, PROTIME in the last 168 hours. Cardiac Enzymes: No results for input(s): CKTOTAL, CKMB, CKMBINDEX, TROPONINI in the last 168 hours. BNP (last 3 results) No results for input(s): PROBNP in the last 8760 hours. HbA1C: No results for input(s): HGBA1C in the last 72 hours. CBG: No results for input(s): GLUCAP in the last 168 hours. Lipid Profile: No results for input(s): CHOL, HDL, LDLCALC, TRIG, CHOLHDL, LDLDIRECT in the last 72 hours. Thyroid Function Tests: Recent Labs    10/22/19 0354  TSH 0.261*  FREET4 1.18*   Anemia Panel: No results for input(s): VITAMINB12, FOLATE, FERRITIN, TIBC, IRON, RETICCTPCT in the last 72 hours. Sepsis Labs: No results for input(s): PROCALCITON, LATICACIDVEN in the last 168 hours.  Recent Results (from the past 240 hour(s))  Blood Culture (routine x 2)     Status: Abnormal   Collection Time: 10/14/19  8:13 PM   Specimen: BLOOD  Result Value Ref Range Status   Specimen Description   Final    BLOOD LEFT ANTECUBITAL Performed at [facility] [address],  [place], [place] [address]    Special Requests   Final    BOTTLES DRAWN AEROBIC AND ANAEROBIC Blood Culture adequate volume Performed at [facility] [address], [place], [place] [address]    Culture  Setup Time   Final    IN BOTH AEROBIC AND ANAEROBIC BOTTLES GRAM POSITIVE COCCI CRITICAL RESULT CALLED TO, READ BACK BY AND VERIFIED WITH: T GREEN PHARMD 10/15/19 1753 JDW    Culture (A)  Final    STAPHYLOCOCCUS SPECIES (COAGULASE NEGATIVE) THE SIGNIFICANCE OF ISOLATING THIS ORGANISM FROM A SINGLE SET OF BLOOD CULTURES WHEN MULTIPLE SETS ARE DRAWN IS UNCERTAIN. PLEASE NOTIFY THE MICROBIOLOGY DEPARTMENT WITHIN ONE WEEK IF SPECIATION AND SENSITIVITIES ARE REQUIRED. Performed at [facility], [place]  [address] [place], [place] [address]    Report Status 10/18/2019 FINAL  Final  Blood Culture (routine x 2)     Status: None   Collection Time: 10/14/19  8:18 PM   Specimen: BLOOD LEFT FOREARM  Result Value Ref Range Status   Specimen Description   Final    BLOOD LEFT FOREARM Performed at [facility], [place] [address] [place], [place] [address]    Special Requests   Final    BOTTLES DRAWN AEROBIC AND ANAEROBIC Blood Culture results may not be optimal due to an excessive volume of blood received in culture bottles Performed at [facility] [address], [place], [place] [address]    Culture   Final    NO GROWTH 5 DAYS Performed at [facility], [place] [address] [place], [place] [address]    Report Status 10/19/2019 FINAL  Final  MRSA PCR Screening     Status: None   Collection Time: 10/16/19  7:41 AM   Specimen: Nasal Mucosa; Nasopharyngeal  Result Value Ref Range Status   MRSA by PCR NEGATIVE NEGATIVE Final    Comment:        The GeneXpert MRSA Assay (FDA approved for NASAL specimens only), is one component of a comprehensive MRSA colonization surveillance program. It is not intended to diagnose MRSA infection nor to guide or  monitor treatment for MRSA infections. Performed at [facility], [place] [address], [place], [place] [address]       Radiology Studies: No results found.    LOS: 10 days   Time spent: More than 50% of that time was spent in counseling and/or coordination of care.  Antonieta Pert, MD [facility]  10/24/2019, 3:11 PM

## 2019-10-24 NOTE — TOC Progression Note (Addendum)
Transition of Care [facility]) - Progression Note    Patient Details  Name: DYQUAN KRIMMER MRN: [id number] Date of Birth: 02-27-1929  Transition of Care [facility]) CM/SW Contact  Servando Snare, LCSW Phone Number: 10/24/2019, 12:39 PM  Clinical Narrative:   Patient ready for dc today. Facility asked for [facility] and stated he can possibly return in the morning. LCSW will fax over [facility] and other clinical docs. LCSW notified floor RN.     Expected Discharge Plan: [place] Barriers to Discharge: No Barriers Identified  Expected Discharge Plan and Services Expected Discharge Plan: [place]   Discharge Planning Services: CM Consult   Living arrangements for the past 2 months: [place] Expected Discharge Date: 10/18/19                                     Social Determinants of Health (SDOH) Interventions    Readmission Risk Interventions No flowsheet data found.

## 2019-10-24 NOTE — Progress Notes (Signed)
Occupational Therapy Treatment Patient Details Name: Victor Clarke MRN: [id number] DOB: 06-Jun-1929 Today's Date: 10/24/2019    History of present illness 85 year old man admitted with fever/cough and dx'd with pna 2* COVID  PMH:  Parkinson's, Etoh, and COPD   OT comments  Pt progressing towards goals. Pt able to sit EOB for grooming tasks with min guard, after sitting, able to stand with min A for balance and vc for safe hand placement. Steps up bed at min A and returned supine. Current POC remains appropriate, OT will continue to follow acutely.     Follow Up Recommendations  SNF    Equipment Recommendations  3 in 1 bedside commode    Recommendations for Other Services      Precautions / Restrictions Precautions Precautions: Fall Precaution Comments: RN reports still orthostatic and bradycardiac (MDs aware) - monitor vitals Restrictions Weight Bearing Restrictions: No       Mobility Bed Mobility Overal bed mobility: Needs Assistance Bed Mobility: Supine to Sit;Sit to Supine     Supine to sit: Min guard;HOB elevated Sit to supine: Min guard   General bed mobility comments: HOB, use of bed rails, increased time required  Transfers Overall transfer level: Needs assistance Equipment used: Rolling walker (2 wheeled) Transfers: Sit to/from Stand Sit to Stand: Min assist         General transfer comment: for balance and safety    Balance Overall balance assessment: Needs assistance Sitting-balance support: Feet supported Sitting balance-Leahy Scale: Good     Standing balance support: During functional activity;No upper extremity supported Standing balance-Leahy Scale: Fair                             ADL either performed or assessed with clinical judgement   ADL Overall ADL's : Needs assistance/impaired     Grooming: Wash/dry hands;Wash/dry face;Set up;Sitting Grooming Details (indicate cue type and reason): EOB                 Toilet  Transfer: Minimal assistance Toilet Transfer Details (indicate cue type and reason): HHA for balance           General ADL Comments: decreased activity tolerance and symptomatic for orthostatics     Vision       Perception     Praxis      Cognition Arousal/Alertness: Awake/alert Behavior During Therapy: WFL for tasks assessed/performed Overall Cognitive Status: No family/caregiver present to determine baseline cognitive functioning                                 General Comments: decreased memory; cues for safety        Exercises     Shoulder Instructions       General Comments      Pertinent Vitals/ Pain       Pain Assessment: No/denies pain  Home Living                                          Prior Functioning/Environment              Frequency  Min 2X/week        Progress Toward Goals  OT Goals(current goals can now be found in the care plan section)  Progress towards OT  goals: Progressing toward goals  Acute Rehab OT Goals Patient Stated Goal: feel better OT Goal Formulation: With patient Time For Goal Achievement: 10/29/19 Potential to Achieve Goals: Good  Plan Discharge plan remains appropriate;Frequency remains appropriate    Co-evaluation                 AM-PAC OT "6 Clicks" Daily Activity     Outcome Measure   Help from another person eating meals?: A Little Help from another person taking care of personal grooming?: A Little Help from another person toileting, which includes using toliet, bedpan, or urinal?: A Little Help from another person bathing (including washing, rinsing, drying)?: A Little Help from another person to put on and taking off regular upper body clothing?: A Little Help from another person to put on and taking off regular lower body clothing?: A Little 6 Click Score: 18    End of Session Equipment Utilized During Treatment: Gait belt;Rolling walker  OT Visit  Diagnosis: Unsteadiness on feet (R26.81)   Activity Tolerance Patient tolerated treatment well   Patient Left in bed;with call bell/phone within reach;with bed alarm set   Nurse Communication Mobility status        Time: [id number] OT Time Calculation (min): 16 min  Charges: OT General Charges $OT Visit: 1 Visit OT Treatments $Self Care/Home Management : 8-22 mins  Hulda Humphrey OTR/L Acute Rehabilitation Services Pager: [phone] Office: [place] 10/24/2019, 5:38 PM

## 2019-10-24 NOTE — NC FL2 (Signed)
[place] LEVEL OF CARE SCREENING TOOL     IDENTIFICATION  Patient Name: Victor Clarke Birthdate: 1929-08-02 Sex: male Admission Date (Current Location): 10/14/2019  [facility] and [place] Number:  Herbalist and Address:  [facility],  [place] [address], [place]      Provider Number: [id number]  Attending Physician Name and Address:  Antonieta Pert, MD  Relative Name and Phone Number:  Trence Patao [phone]    Current Level of Care: Hospital Recommended Level of Care: [place] Prior Approval Number:    Date Approved/Denied:   PASRR Number: [id number]  Discharge Plan: Other (Comment)(ALF)    Current Diagnoses: Patient Active Problem List   Diagnosis Date Noted  . Pneumonia due to COVID-19 virus 10/14/2019  . Hyponatremia 10/14/2019  . Cognitive impairment 01/05/2019  . Problematic consumption of alcohol 12/01/2018  . Tooth impaction 12/01/2018  . Ataxia 12/01/2018  . First degree AV block   . Hypotension due to hypovolemia   . Syncope 06/18/2018  . Leukopenia 06/18/2018  . AV block, [place] II 06/18/2018  . Memory loss 10/22/2016  . Malnutrition of moderate degree 11/15/2015  . Fall from steps 11/07/2015  . Anemia 11/07/2015  . Alcohol dependence ([place]) 11/07/2015  . Pyuria 11/07/2015  . Dehydration 08/08/2015  . UTI (urinary tract infection) 08/08/2015  . High anion gap metabolic acidosis [id number]  . Dizziness 07/31/2015  . Orthostatic hypotension 07/31/2015  . Lactic acidosis 07/31/2015  . Ectopic atrial tachycardia ([place])   . Hypercholesteremia   . Dyspnea on exertion 09/16/2014  . Hypertension 09/16/2014    Orientation RESPIRATION BLADDER Height & Weight     Self, Place  Normal Continent Weight: 161 lb 6 oz (73.2 kg) Height:  5\' 6"  (167.6 cm)  BEHAVIORAL SYMPTOMS/MOOD NEUROLOGICAL BOWEL NUTRITION STATUS      Continent Diet(See dc summary)  AMBULATORY STATUS COMMUNICATION OF NEEDS  Skin   Extensive Assist Verbally Other (Comment)(Moisture Damaged, Rash Groin)                       Personal Care Assistance Level of Assistance  Bathing, Feeding, Dressing Bathing Assistance: Limited assistance Feeding assistance: Independent Dressing Assistance: Limited assistance     Functional Limitations Info  Sight, Hearing, Speech Sight Info: Adequate Hearing Info: Impaired Speech Info: Adequate    SPECIAL CARE FACTORS FREQUENCY  PT (By licensed PT), OT (By licensed OT)     PT Frequency: Eval and Treat OT Frequency: Eval and Treat            Contractures Contractures Info: Present    Additional Factors Info  Code Status, Allergies Code Status Info: DNR Allergies Info: NKA           Current Medications (10/24/2019):  This is the current hospital active medication list Current Facility-Administered Medications  Medication Dose Route Frequency Provider Last Rate Last Dose  . acetaminophen (TYLENOL) tablet 650 mg  650 mg Oral Q6H PRN Elwyn Reach, MD   650 mg at 10/20/19 0903  . albuterol (VENTOLIN HFA) 108 (90 Base) MCG/ACT inhaler 2 puff  2 puff Inhalation Q6H PRN Manuella Ghazi, Pratik D, DO      . aspirin EC tablet 81 mg  81 mg Oral Q M,W,F Elwyn Reach, MD   81 mg at 10/22/19 0943  . chlorpheniramine-HYDROcodone (TUSSIONEX) 10-8 MG/5ML suspension 5 mL  5 mL Oral Q12H PRN Elwyn Reach, MD   5 mL at 10/17/19 2245  .  cholecalciferol (VITAMIN D3) tablet 1,000 Units  1,000 Units Oral Daily Elwyn Reach, MD   1,000 Units at 10/24/19 1055  . enoxaparin (LOVENOX) injection 40 mg  40 mg Subcutaneous Daily Gala Romney L, MD   40 mg at 10/24/19 1056  . escitalopram (LEXAPRO) tablet 10 mg  10 mg Oral Daily Elwyn Reach, MD   10 mg at 10/24/19 1054  . feeding supplement (ENSURE ENLIVE) (ENSURE ENLIVE) liquid 237 mL  237 mL Oral TID BM Barb Merino, MD   237 mL at 10/24/19 1056  . fludrocortisone (FLORINEF) tablet 0.1 mg  0.1 mg Oral Daily Kc,  Ramesh, MD      . guaiFENesin-dextromethorphan (ROBITUSSIN DM) 100-10 MG/5ML syrup 10 mL  10 mL Oral Q4H PRN Gala Romney L, MD      . hydrocortisone 1 % lotion   Topical BID Barb Merino, MD      . lip balm (CARMEX) ointment   Topical PRN Barb Merino, MD      . loperamide (IMODIUM) capsule 2 mg  2 mg Oral Q6H PRN Kc, Ramesh, MD      . magnesium oxide (MAG-OX) tablet 400 mg  400 mg Oral Daily Gala Romney L, MD   400 mg at 10/24/19 1055  . midodrine (PROAMATINE) tablet 5 mg  5 mg Oral TID WC Kc, Ramesh, MD   5 mg at 10/24/19 0831  . multivitamin with minerals tablet 1 tablet  1 tablet Oral Daily Elwyn Reach, MD   1 tablet at 10/24/19 1056  . ondansetron (ZOFRAN) tablet 4 mg  4 mg Oral Q6H PRN Elwyn Reach, MD       Or  . ondansetron (ZOFRAN) injection 4 mg  4 mg Intravenous Q6H PRN Jonelle Sidle, Mohammad L, MD      . potassium chloride SA (KLOR-CON) CR tablet 20 mEq  20 mEq Oral Daily Kc, Ramesh, MD   20 mEq at 10/24/19 1055  . rosuvastatin (CRESTOR) tablet 10 mg  10 mg Oral QHS Elwyn Reach, MD   10 mg at 10/23/19 2220     Discharge Medications: Please see discharge summary for a list of discharge medications.  Relevant Imaging Results:  Relevant Lab Results:   Additional Information [id number]  Servando Snare, LCSW

## 2019-10-25 NOTE — Progress Notes (Signed)
Physical Therapy Treatment Patient Details Name: Victor Clarke MRN: [id number] DOB: 1929-11-05 Today's Date: 10/25/2019    History of Present Illness 85 year old man admitted with fever/cough and dx'd with pna 2* COVID  PMH:  Parkinson's, Etoh, and COPD    PT Comments    Pt reports not feeling well on today. He needed some encouragement to participate with PT. Pt remains unsteady. Continue to recommend SNF. If pt chooses to return to ALF, recommend HHPT f/u.    Follow Up Recommendations  SNF;Supervision/Assistance - 24 hour(HHPT if pt returns to ALF)     Equipment Recommendations  None recommended by PT    Recommendations for Other Services       Precautions / Restrictions Precautions Precautions: Fall Precaution Comments: RN reports still orthostatic and bradycardiac (MDs aware) - monitor vitals Restrictions Weight Bearing Restrictions: No    Mobility  Bed Mobility Overal bed mobility: Needs Assistance Bed Mobility: Supine to Sit;Sit to Supine     Supine to sit: Supervision;HOB elevated Sit to supine: Supervision;HOB elevated   General bed mobility comments: increased time.  Transfers Overall transfer level: Needs assistance Equipment used: None Transfers: Sit to/from Stand Sit to Stand: Min assist         General transfer comment: pt declined cane/rw. assist to rise, steady, control descent. increased ant-post swaying.  Ambulation/Gait Ambulation/Gait assistance: Min assist           General Gait Details: side steps along side of bed only. pt declined ambulation 2* not feeling up to it.   Stairs             Wheelchair Mobility    Modified Rankin (Stroke Patients Only)       Balance                                            Cognition Arousal/Alertness: Awake/alert Behavior During Therapy: WFL for tasks assessed/performed Overall Cognitive Status: No family/caregiver present to determine baseline cognitive  functioning                                 General Comments: decreased memory; cues for safety      Exercises General Exercises - Lower Extremity Long Arc Quad: AROM;Both;10 reps;Seated    General Comments        Pertinent Vitals/Pain Pain Assessment: No/denies pain    Home Living                      Prior Function            PT Goals (current goals can now be found in the care plan section) Progress towards PT goals: Progressing toward goals    Frequency    Min 2X/week      PT Plan Current plan remains appropriate    Co-evaluation              AM-PAC PT "6 Clicks" Mobility   Outcome Measure  Help needed turning from your back to your side while in a flat bed without using bedrails?: None Help needed moving from lying on your back to sitting on the side of a flat bed without using bedrails?: None Help needed moving to and from a bed to a chair (including a wheelchair)?: A Little Help needed standing up  from a chair using your arms (e.g., wheelchair or bedside chair)?: A Little Help needed to walk in hospital room?: A Little Help needed climbing 3-5 steps with a railing? : A Little 6 Click Score: 20    End of Session Equipment Utilized During Treatment: Gait belt Activity Tolerance: Patient limited by fatigue Patient left: in bed;with call bell/phone within reach;with bed alarm set   PT Visit Diagnosis: Muscle weakness (generalized) (M62.81);Difficulty in walking, not elsewhere classified (R26.2)     Time: [id number] PT Time Calculation (min) (ACUTE ONLY): 16 min  Charges:  $Therapeutic Activity: 8-22 mins                        Weston Anna, PT Acute Rehabilitation Services Pager: [phone] Office: [phone]

## 2019-10-25 NOTE — Progress Notes (Signed)
Initial Nutrition Assessment  RD working remotely.   DOCUMENTATION CODES:   Not applicable  INTERVENTION:  - will order Magic Cup BID with meals, each supplement provides 290 kcal and 9 grams of protein. - will order Hormel Shake with breakfast meals, each supplement provides 500 kcal, 22 grams protein. - weigh patient today.     NUTRITION DIAGNOSIS:   Increased nutrient needs related to acute illness(COVID-19) as evidenced by estimated needs.  GOAL:   Patient will meet greater than or equal to 90% of their needs  MONITOR:   PO intake, Supplement acceptance, Labs, Weight trends  REASON FOR ASSESSMENT:   LOS(day #11)  ASSESSMENT:   85 year old with peripheral neuropathy, COPD, HTN, and hyperlipidemia. He resides at an ALF and was sent to the ED due to fever, cough, and recently testing positive for COVID-19. CXR in the ED showed bilateral infiltrates.  Flow sheet documentation indicates that patient consumed 0% of breakfast and lunch on 12/3; 0% of breakfast on 12/4; 0% of breakfast and lunch on 12/6; 0% of breakfast today. Will order supplements as outlined above.   Patient was only weighed on date of admission (11/28) at which time he weighed 161 lb. Weight on that date was the highest it had been in >1 year.  Per notes, possible discharge as soon as tomorrow (12/8).   Labs reviewed; BUN: 38 mg/dl, Ca: 8.5 mg/dl. Medications reviewed; 1000 units cholecalciferol/day, 400 mg mag-ox/day, daily multivitamin with minerals, 20 mEq Klor-Con/day.    NUTRITION - FOCUSED PHYSICAL EXAM:  unable to complete for COVID-19 positive patient.   Diet Order:   Diet Order            Diet - low sodium heart healthy        Diet Heart Room service appropriate? Yes; Fluid consistency: Thin  Diet effective now              EDUCATION NEEDS:   No education needs have been identified at this time  Skin:  Skin Assessment: Reviewed RN Assessment  Last BM:  12/7  Height:   Ht  Readings from Last 1 Encounters:  10/16/19 5\' 6"  (1.676 m)    Weight:   Wt Readings from Last 1 Encounters:  10/16/19 73.2 kg    Ideal Body Weight:  64.5 kg  BMI:  Body mass index is 26.05 kg/m.  Estimated Nutritional Needs:   Kcal:  1800-2015 kcal  Protein:  90-100 grams  Fluid:  >/= 2L/day      Jarome Matin, MS, RD, LDN, CNSC Inpatient Clinical Dietitian Pager # [phone] After hours/weekend pager # [phone]

## 2019-10-25 NOTE — Progress Notes (Signed)
PROGRESS NOTE    Victor Clarke  [id number] DOB: 17-May-1929 DOA: 10/14/2019 PCP: Sherald Hess., MD   Brief Narrative:  As per previous review:85 year old with peripheral neuropathy, copd, hypertension, hyperlipidemia from ALF sent to the emergency room with fever, cough and recently tested COVID-19. Patient's only complaint was lightheadedness, fatigue and lightheadedness. In the emergency room, he was hemodynamically stable. His creatinine was slightly elevated from baseline. Chest x-ray showed bilateral infiltrates. COVID-19 test was positive. Emergency room reportedthat he was 88% on ambulation. He has mostly remained on room air.  12/1:He was thought to be ready for discharge on 11/30, however he continued to have persistent orthostatic hypotension and was started on some midodrine and also given a fluid bolus.  Despite these interventions, he continues to have persistent orthostasis and is asymptomatic with ambulation despite his blood pressures being quite low.started him on Florinef and maintained on midodrine. Discontinued Xanax as well since this can be contributing to his symptoms. ordered 2D echocardiogram as prior was done in 2019.   Continues to be orthostatic and symptomatic, also bradycardic. Cardio consulted and no further inpatient recommendation.  Subjective:  Seen this morning. Patient endorses not feeling well today.  Complains of weakness. Appears to be orthostatic and symptomatic on getting up  Assessment & Plan:   Pneumonia due to COVID-19 virus:Patient finished the course of  remdesivir antibiotics and Decadron. No respiratory symptoms and doing well on RA.   Orthostatic hypotension:Patient remains orthostatic and symptomatic-Appreciate cardiology input.  His cortisol was low but already on Decadron for 10 days likely suppressed from decadron and cosyntropin test will likely be of questionable value, completed Decadron 12/5. Now placed on florinef  starting 12/6.He is on midodrine will continue with holding parameters to avoid supine hypertension. Echocardiogram shows normal EF with no acute finding.He may essentially be bed bound if it is does not improve, wonder if it due to his ?parkinson's/autonomic dysregulation.Appreciate cardiology input in this matter. Change stocking form knee to thigh.I was able to get hold of [name]- she tells me that pt may have had simialr issues and that's why not able to drive. We discussed abt different treatment modalities.  Continue above treatment, continue bedrest, close supervision for mobility. I discussed with the patient he understands and agreeable with the current plan.  Sinus Bradycardia:in 40s- ongoing. HR up to 60s on standing.  Seen by cardiology.  No high-grade AV block.  Abnormal thyroid function test with TSH suppressed  and free T4 slightly up-suspect due to acute illness, not clinical evidence of hyperthyroidism in fact has been bradycardic.  Repeat TSH.FT4 in 2-3 weeks  Hypokalemia repleted.  HTN-Bp stable at rest today. Home meds on hold given orthostatic hypotension. Had supine hypertension, use prn meds. On midodrine.  Hypercholesteremia: On Crestor  AKI resolved  Hyponatremia: Resolved.  Cognitive impairment with early dementia/questionable Parkinson's: Continue home Lexapro. Supportive care.  Body mass index is 26.05 kg/m.   DVT prophylaxis:lovenox Code Status: DNR Family Communication: plan of care discussed with patient at bedside.  I had discussed with patient brother and niece and plan is to return to a skilled nursing facility Disposition Plan: Anticipate discharge tomorrow , CM spoke w SNF who are reviewing his chart  Consultants: none Procedures: none Echocardiogram 12/1  1. Left ventricular ejection fraction, by visual estimation, is 60 to 65%. The left ventricle has normal function. There is no left ventricular hypertrophy.  2. Global right ventricle has  normal systolic function.The right  ventricular size is normal. No increase in right ventricular wall thickness.  3. Left atrial size was mildly dilated.  4. Right atrial size was normal.  5. Moderate mitral annular calcification.  6. The mitral valve is normal in structure. Mild mitral valve regurgitation. No evidence of mitral stenosis.  7. The tricuspid valve is normal in structure. Tricuspid valve regurgitation is not demonstrated.  8. The aortic valve was not well visualized. Aortic valve regurgitation is not visualized. leaflets calcified with some restricted motion Poor CW signal likely mild AS.  9. The pulmonic valve was normal in structure. Pulmonic valve regurgitation is not visualized. 10. The aortic root was not well visualized. 11. Normal pulmonary artery systolic pressure. 12. The inferior vena cava is normal in size with greater than 50% respiratory variability, suggesting right atrial pressure of 3 mmHg.  Microbiology: coag neg staph in one set- contaminant  Antimicrobials: Anti-infectives (From admission, onward)   Start     Dose/Rate Route Frequency Ordered Stop   10/18/19 0000  azithromycin (ZITHROMAX) 250 MG tablet     250 mg Oral  Once 10/18/19 0918 10/18/19 2359   10/17/19 2200  azithromycin (ZITHROMAX) tablet 500 mg  Status:  Discontinued     500 mg Oral Daily at bedtime 10/17/19 1025 10/19/19 1233   10/15/19 1800  remdesivir 100 mg in sodium chloride 0.9 % 250 mL IVPB     100 mg 500 mL/hr over 30 Minutes Intravenous Every 24 hours 10/14/19 2156 10/18/19 2110   10/14/19 2345  azithromycin (ZITHROMAX) 500 mg in sodium chloride 0.9 % 250 mL IVPB  Status:  Discontinued     500 mg 250 mL/hr over 60 Minutes Intravenous Every 24 hours 10/14/19 2335 10/17/19 1025   10/14/19 2345  cefTRIAXone (ROCEPHIN) 1 g in sodium chloride 0.9 % 100 mL IVPB  Status:  Discontinued     1 g 200 mL/hr over 30 Minutes Intravenous Every 24 hours 10/14/19 2335 10/19/19 1233   10/14/19 2230   remdesivir 200 mg in sodium chloride 0.9 % 250 mL IVPB     200 mg 500 mL/hr over 30 Minutes Intravenous Once 10/14/19 2155 10/14/19 2304       Objective: Vitals:   10/24/19 1155 10/24/19 2055 10/25/19 0539 10/25/19 1135  BP: 118/60 (!) 169/80 132/75 130/78  Pulse: (!) 51 (!) 50 (!) 49 (!) 51  Resp: 15   19  Temp: 97.7 F (36.5 C) 98.9 F (37.2 C) 98 F (36.7 C)   TempSrc: Oral Oral Oral   SpO2: 94% 93% 97% 96%  Weight:      Height:        Intake/Output Summary (Last 24 hours) at 10/25/2019 1611 Last data filed at 10/25/2019 1110 Gross per 24 hour  Intake 240 ml  Output 730 ml  Net -490 ml   Filed Weights   10/16/19 1147  Weight: 73.2 kg   Weight change:   Body mass index is 26.05 kg/m.  Intake/Output from previous day: 12/06 0701 - 12/07 0700 In: 0  Out: 650 [Urine:650] Intake/Output this shift: Total I/O In: 240 [P.O.:240] Out: 80 [Stool:80]  Examination:  General exam: AAOX3, weak, not in distress, on room air.   HEENT:Oral mucosa moist, Ear/Nose WNL grossly, dentition normal. Respiratory system:  bilaterally clear breath sounds without wheezing.  No use of accessory muscle. Cardiovascular system: S1 & S2 +, No JVD, regular RR. Gastrointestinal system: Abdomen soft, NT,ND, BS+ Nervous System:Alert, awake, oriented, moving all his extremities and nonfocal.  Extremities:  No leg edema, distal pulses intact. Skin: No rashes,no icterus. MSK: Normal muscle bulk,tone, power.  Medications:  Scheduled Meds: . aspirin EC  81 mg Oral Q M,W,F  . cholecalciferol  1,000 Units Oral Daily  . enoxaparin (LOVENOX) injection  40 mg Subcutaneous Daily  . escitalopram  10 mg Oral Daily  . feeding supplement (ENSURE ENLIVE)  237 mL Oral TID BM  . fludrocortisone  0.1 mg Oral Daily  . hydrocortisone   Topical BID  . magnesium oxide  400 mg Oral Daily  . midodrine  5 mg Oral TID WC  . multivitamin with minerals  1 tablet Oral Daily  . potassium chloride  20 mEq Oral  Daily  . rosuvastatin  10 mg Oral QHS   Continuous Infusions:   Data Reviewed: I have personally reviewed following labs and imaging studies  CBC: Recent Labs  Lab 10/20/19 0330 10/24/19 0424  WBC 5.1 8.9  HGB 14.2 14.5  HCT 42.0 43.3  MCV 88.1 88.7  PLT 181 [id number]   Basic Metabolic Panel: Recent Labs  Lab 10/20/19 0330 10/22/19 0354 10/24/19 0424  NA 136 135 136  K 3.4* 3.2* 4.4  CL 103 103 104  CO2 21* 21* 20*  GLUCOSE 176* 179* 182*  BUN 33* 31* 38*  CREATININE 1.03 0.76 0.99  CALCIUM 8.2* 8.0* 8.5*   GFR: Estimated Creatinine Clearance: 46.5 mL/min (by C-G formula based on SCr of 0.99 mg/dL). Liver Function Tests: No results for input(s): AST, ALT, ALKPHOS, BILITOT, PROT, ALBUMIN in the last 168 hours. No results for input(s): LIPASE, AMYLASE in the last 168 hours. No results for input(s): AMMONIA in the last 168 hours. Coagulation Profile: No results for input(s): INR, PROTIME in the last 168 hours. Cardiac Enzymes: No results for input(s): CKTOTAL, CKMB, CKMBINDEX, TROPONINI in the last 168 hours. BNP (last 3 results) No results for input(s): PROBNP in the last 8760 hours. HbA1C: No results for input(s): HGBA1C in the last 72 hours. CBG: No results for input(s): GLUCAP in the last 168 hours. Lipid Profile: No results for input(s): CHOL, HDL, LDLCALC, TRIG, CHOLHDL, LDLDIRECT in the last 72 hours. Thyroid Function Tests: No results for input(s): TSH, T4TOTAL, FREET4, T3FREE, THYROIDAB in the last 72 hours. Anemia Panel: No results for input(s): VITAMINB12, FOLATE, FERRITIN, TIBC, IRON, RETICCTPCT in the last 72 hours. Sepsis Labs: No results for input(s): PROCALCITON, LATICACIDVEN in the last 168 hours.  Recent Results (from the past 240 hour(s))  MRSA PCR Screening     Status: None   Collection Time: 10/16/19  7:41 AM   Specimen: Nasal Mucosa; Nasopharyngeal  Result Value Ref Range Status   MRSA by PCR NEGATIVE NEGATIVE Final    Comment:        The  GeneXpert MRSA Assay (FDA approved for NASAL specimens only), is one component of a comprehensive MRSA colonization surveillance program. It is not intended to diagnose MRSA infection nor to guide or monitor treatment for MRSA infections. Performed at [facility], [place] [address], [place], [place] [address]       Radiology Studies: No results found.    LOS: 11 days   Time spent: More than 50% of that time was spent in counseling and/or coordination of care.  Antonieta Pert, MD [facility]  10/25/2019, 4:11 PM

## 2019-10-25 NOTE — TOC Progression Note (Addendum)
Transition of Care [facility]) - Progression Note    Patient Details  Name: Victor Clarke MRN: [id number] Date of Birth: 12-Jun-1929  Transition of Care [facility]) CM/SW [place], [place] Phone Number: 10/25/2019, 4:24 PM  Clinical Narrative:    CSW spoke with facility staff about the patient return today. The facility will not accept the patient today because the admitting nurse Gladstone Pih was not onsite today. The facility requires the paperwork be reviewed before the patient can admit. The facility will review the patient clincial paperwork tomorrow.  The physician and patient brother updated.  TOC staff will continue to assist with discharge needs.    Expected Discharge Plan: Skilled Nursing Facility Barriers to Discharge: Other (comment)(Facilitiy RN staff not available)  Expected Discharge Plan and Services Expected Discharge Plan: [place]   Discharge Planning Services: CM Consult   Living arrangements for the past 2 months: [place] Expected Discharge Date: 10/18/19                                     Social Determinants of Health (SDOH) Interventions    Readmission Risk Interventions No flowsheet data found.

## 2019-10-26 DIAGNOSIS — Z515 Encounter for palliative care: Secondary | ICD-10-CM

## 2019-10-26 DIAGNOSIS — Z7189 Other specified counseling: Secondary | ICD-10-CM

## 2019-10-26 LAB — TSH: TSH: 1.064 u[IU]/mL (ref 0.350–4.500)

## 2019-10-26 LAB — T4, FREE: Free T4: 1.27 ng/dL — ABNORMAL HIGH (ref 0.61–1.12)

## 2019-10-26 MED ORDER — FLUDROCORTISONE ACETATE 0.1 MG PO TABS: 0.1000 mg | ORAL_TABLET | Freq: Every day | ORAL | 0 refills | Status: AC

## 2019-10-26 MED ORDER — ENSURE ENLIVE PO LIQD: 237.0000 mL | Freq: Three times a day (TID) | ORAL | 0 refills | Status: AC

## 2019-10-26 MED ORDER — MIDODRINE HCL 5 MG PO TABS: 5.0000 mg | ORAL_TABLET | Freq: Three times a day (TID) | ORAL | 0 refills | Status: AC

## 2019-10-26 NOTE — Discharge Summary (Addendum)
Physician Discharge Summary  Victor Clarke [id number] DOB: 09/26/1929 DOA: 10/14/2019  PCP: Victor Clarke., MD  Admit date: 10/14/2019 Discharge date: 10/26/2019  Admitted From: ALF Disposition: ALF with HOSPICE TO EVAL AND TREAT  Recommendations for Outpatient Follow-up:  1. Follow up with PCP in 1-2 weeks  Home Health:NONE  Equipment/Devices:NONE  Discharge Condition: Stable CODE STATUS: FULL Diet recommendation: Regular Diet  Brief/Interim Summary:  As per previous review:85 year old with peripheral neuropathy, copd, hypertension, hyperlipidemia from ALF sent to the emergency room with fever, cough and recently tested COVID-19. Patient's only complaint was lightheadedness, fatigue and lightheadedness. In the emergency room, he was hemodynamically stable. His creatinine was slightly elevated from baseline. Chest x-ray showed bilateral infiltrates. COVID-19 test was positive. Emergency room reportedthat he was 88% on ambulation. He has mostly remained on room air.  12/1:He was thought to be ready for discharge on 11/30, however he continued to have persistent orthostatic hypotension and was started on some midodrine and also given a fluid bolus. Despite these interventions, he continues to have persistent orthostasis and is asymptomatic with ambulation despite his blood pressures being quite low.started him on Florinef and maintained on midodrine. Discontinued Xanax as well since this can be contributing to his symptoms. ordered 2D echocardiogram as prior was done in 2019.  Continue to be orthostatic and symptomatic, also bradycardic. Cardio consulted and no further inpatient recommendation. At this point patient remains very weak, frail has lost weight and not much appetite and not eating much. Difficult disposition due to his continued symptomatic orthostasis and with failure to thrive. Seen by palliative care at this point likely has less than 6 months, and  advised hospice service to evaluate and treat at the assisted living facility. Discussed with patient's brother Mr. Victor Clarke updated and agrees with the plan of care. Patient will be discharged back to assisted living facility and to evaluate and start hospice there  Discharge Diagnoses:  Principal Problem:   Pneumonia due to COVID-19 virus Active Problems:   Hypertension   Hypercholesteremia   Cognitive impairment   Hyponatremia  Pneumonia due to COVID-19 virus:Patient finished the course of  remdesivir antibiotics and Decadron. No respiratory symptoms and doing well on RA.  Orthostatic hypotension:Patient remains orthostatic and symptomatic-Appreciate cardiology input.  His cortisol was low but already on Decadron for 10 days likely suppressed from decadron and cosyntropin test will likely be of questionable value, completed Decadron 12/5. Now placed on florinef starting 12/6.He is on midodrine will continue with holding parameters to avoid supine hypertension. Echocardiogram shows normal EF with no acute finding.He may essentially be bed bound if it is does not improve, wonder if it due to his ?parkinson's/autonomic dysregulation.Appreciate cardiology input in this matter. Cont thigh stocking ., diucussed with pt's niece Victor Clarke- she tells me that pt may have had simialr issues and that's why not able to drive. We discussed abt different treatment modalities.  Continue above treatment, continue bedrest, close supervision for mobility. I discussed with the patient he understands and agreeable with the current plan.  Sinus Bradycardia:in 40s- ongoing. HR up to 60s on standing.  Seen by cardiology.  No high-grade AV block.  Abnormal thyroid function test with TSH suppressed  and free T4 slightly up-suspect due to acute illness, not clinical evidence of hyperthyroidism in fact has been bradycardic.  Repeat TSH.FT4 in 2-3 weeks  Hypokalemia repleted.  HTN-Bp stable at rest today. Home  meds on hold given orthostatic hypotension. Had supine hypertension, use prn  meds. On midodrine.  Hypercholesteremia: On Crestor  AKI resolved  Hyponatremia: Resolved.  Cognitive impairment with early dementia/questionable Parkinson's: Continue home Lexapro. Supportive care  Failure to thrive/weight loss/poor appetite: Patient continues to decline.  Seen by palliative care is DNR and has advised to return to assisted facility and start hospice.  Updated patient's brother over the phone who is in agreement.  Discharge Instructions  Discharge Instructions    Diet general   Complete by: As directed    Discharge instructions   Complete by: As directed    Person Under Monitoring Name: Victor Clarke  Location: [place] [address]  Please stay in quarantine for 3 weeks from the positive test date 10/14/2019 or onset of symptoms which ever is first  Infection Prevention Recommendations for Individuals Confirmed to have, or Being Evaluated for, 2019 Novel Coronavirus (COVID-19) Infection Who Receive Care at Home  Individuals who are confirmed to have, or are being evaluated for, COVID-19 should follow the prevention steps below until a healthcare provider or local or state health department says they can return to normal activities.  Stay home except to get medical care You should restrict activities outside your home, except for getting medical care. Do not go to work, school, or public areas, and do not use public transportation or taxis.  Call ahead before visiting your doctor Before your medical appointment, call the healthcare provider and tell them that you have, or are being evaluated for, COVID-19 infection. This will help the healthcare provider's office take steps to keep other people from getting infected. Ask your healthcare provider to call the local or state health department.  Monitor your symptoms Seek prompt medical attention if your illness is  worsening (e.g., difficulty breathing). Before going to your medical appointment, call the healthcare provider and tell them that you have, or are being evaluated for, COVID-19 infection. Ask your healthcare provider to call the local or state health department.  Wear a facemask You should wear a facemask that covers your nose and mouth when you are in the same room with other people and when you visit a healthcare provider. People who live with or visit you should also wear a facemask while they are in the same room with you.  Separate yourself from other people in your home As much as possible, you should stay in a different room from other people in your home. Also, you should use a separate bathroom, if available.  Avoid sharing household items You should not share dishes, drinking glasses, cups, eating utensils, towels, bedding, or other items with other people in your home. After using these items, you should wash them thoroughly with soap and water.  Cover your coughs and sneezes Cover your mouth and nose with a tissue when you cough or sneeze, or you can cough or sneeze into your sleeve. Throw used tissues in a lined trash can, and immediately wash your hands with soap and water for at least 20 seconds or use an alcohol-based hand rub.  Wash your [facility] your hands often and thoroughly with soap and water for at least 20 seconds. You can use an alcohol-based hand sanitizer if soap and water are not available and if your hands are not visibly dirty. Avoid touching your eyes, nose, and mouth with unwashed hands.   Prevention Steps for Caregivers and Household Members of Individuals Confirmed to have, or Being Evaluated for, COVID-19 Infection Being Cared for in the Home  If you  live with, or provide care at home for, a person confirmed to have, or being evaluated for, COVID-19 infection please follow these guidelines to prevent infection:  Follow healthcare provider's  instructions Make sure that you understand and can help the patient follow any healthcare provider instructions for all care.  Provide for the patient's basic needs You should help the patient with basic needs in the home and provide support for getting groceries, prescriptions, and other personal needs.  Monitor the patient's symptoms If they are getting sicker, call his or her medical provider and tell them that the patient has, or is being evaluated for, COVID-19 infection. This will help the healthcare provider's office take steps to keep other people from getting infected. Ask the healthcare provider to call the local or state health department.  Limit the number of people who have contact with the patient If possible, have only one caregiver for the patient. Other household members should stay in another home or place of residence. If this is not possible, they should stay in another room, or be separated from the patient as much as possible. Use a separate bathroom, if available. Restrict visitors who do not have an essential need to be in the home.  Keep older adults, very young children, and other sick people away from the patient Keep older adults, very young children, and those who have compromised immune systems or chronic health conditions away from the patient. This includes people with chronic heart, lung, or kidney conditions, diabetes, and cancer.  Ensure good ventilation Make sure that shared spaces in the home have good air flow, such as from an air conditioner or an opened window, weather permitting.  Wash your hands often Wash your hands often and thoroughly with soap and water for at least 20 seconds. You can use an alcohol based hand sanitizer if soap and water are not available and if your hands are not visibly dirty. Avoid touching your eyes, nose, and mouth with unwashed hands. Use disposable paper towels to dry your hands. If not available, use dedicated cloth  towels and replace them when they become wet.  Wear a facemask and gloves Wear a disposable facemask at all times in the room and gloves when you touch or have contact with the patient's blood, body fluids, and/or secretions or excretions, such as sweat, saliva, sputum, nasal mucus, vomit, urine, or feces.  Ensure the mask fits over your nose and mouth tightly, and do not touch it during use. Throw out disposable facemasks and gloves after using them. Do not reuse. Wash your hands immediately after removing your facemask and gloves. If your personal clothing becomes contaminated, carefully remove clothing and launder. Wash your hands after handling contaminated clothing. Place all used disposable facemasks, gloves, and other waste in a lined container before disposing them with other household waste. Remove gloves and wash your hands immediately after handling these items.  Do not share dishes, glasses, or other household items with the patient Avoid sharing household items. You should not share dishes, drinking glasses, cups, eating utensils, towels, bedding, or other items with a patient who is confirmed to have, or being evaluated for, COVID-19 infection. After the person uses these items, you should wash them thoroughly with soap and water.  Wash laundry thoroughly Immediately remove and wash clothes or bedding that have blood, body fluids, and/or secretions or excretions, such as sweat, saliva, sputum, nasal mucus, vomit, urine, or feces, on them. Wear gloves when handling laundry from the patient.  Read and follow directions on labels of laundry or clothing items and detergent. In general, wash and dry with the warmest temperatures recommended on the label.  Clean all areas the individual has used often Clean all touchable surfaces, such as counters, tabletops, doorknobs, bathroom fixtures, toilets, phones, keyboards, tablets, and bedside tables, every day. Also, clean any surfaces that may  have blood, body fluids, and/or secretions or excretions on them. Wear gloves when cleaning surfaces the patient has come in contact with. Use a diluted bleach solution (e.g., dilute bleach with 1 part bleach and 10 parts water) or a household disinfectant with a label that says EPA-registered for coronaviruses. To make a bleach solution at home, add 1 tablespoon of bleach to 1 quart (4 cups) of water. For a larger supply, add  cup of bleach to 1 gallon (16 cups) of water. Read labels of cleaning products and follow recommendations provided on product labels. Labels contain instructions for safe and effective use of the cleaning product including precautions you should take when applying the product, such as wearing gloves or eye protection and making sure you have good ventilation during use of the product. Remove gloves and wash hands immediately after cleaning.  Monitor yourself for signs and symptoms of illness Caregivers and household members are considered close contacts, should monitor their health, and will be asked to limit movement outside of the home to the extent possible. Follow the monitoring steps for close contacts listed on the symptom monitoring form.   ? If you have additional questions, contact your local health department or call the epidemiologist on call at [phone] (available 24/7). ? This guidance is subject to change. For the most up-to-date guidance from [facility], please refer to their website: YouBlogs.pl   Increase activity slowly   Complete by: As directed      Allergies as of 10/26/2019   No Known Allergies     Medication List    STOP taking these medications   amLODipine-benazepril 5-20 MG capsule Commonly known as: LOTREL     TAKE these medications   albuterol 108 (90 Base) MCG/ACT inhaler Commonly known as: VENTOLIN HFA Inhale 2 puffs into the lungs 3 (three) times daily.   ALPRAZolam  0.25 MG tablet Commonly known as: XANAX Take 0.5 tablets (0.125 mg total) by mouth 2 (two) times daily.   aspirin EC 81 MG tablet Take 81 mg by mouth every Monday, Wednesday, and Friday.   cholecalciferol 25 MCG (1000 UT) tablet Commonly known as: VITAMIN D3 Take 1,000 Units by mouth daily.   Coal Tar Extract 2 % Sham Apply 1 application topically 2 (two) times a week.   escitalopram 10 MG tablet Commonly known as: LEXAPRO Take 10 mg by mouth daily.   feeding supplement (ENSURE ENLIVE) Liqd Take 237 mLs by mouth 3 (three) times daily between meals.   fludrocortisone 0.1 MG tablet Commonly known as: FLORINEF Take 1 tablet (0.1 mg total) by mouth daily. Start taking on: October 27, 2019   guaiFENesin-dextromethorphan 100-10 MG/5ML syrup Commonly known as: ROBITUSSIN DM Take 10 mLs by mouth every 4 (four) hours as needed for cough.   Magnesium Oxide 250 MG Tabs Take 500 mg by mouth daily.   miconazole 2 % cream Commonly known as: MICOTIN Apply 1 application topically daily as needed (rash, jock itch).   midodrine 5 MG tablet Commonly known as: PROAMATINE Take 1 tablet (5 mg total) by mouth 3 (three) times daily with meals.   multivitamin tablet Take 1 tablet  by mouth daily.   nystatin cream Commonly known as: MYCOSTATIN Apply topically 2 (two) times daily. Apply on bilateral groins What changed:   how much to take  when to take this  reasons to take this   nystatin-triamcinolone cream Commonly known as: MYCOLOG II Apply 1 application topically 2 (two) times daily.   rosuvastatin 10 MG tablet Commonly known as: CRESTOR Take 10 mg by mouth at bedtime.   triamcinolone cream 0.1 % Commonly known as: KENALOG Apply 1 application topically 2 (two) times daily.   VITAMIN D PO Take 1,000 Units by mouth daily.      Follow-up Information    Victor Clarke., MD Follow up in 1 week(s).   Specialty: Family Medicine Why: Hospice service to eval and start  on return to Assisted living facility Contact information: [place] [address] [phone]          No Known Allergies  Procedures/Studies: Dg Chest Port 1 View  Result Date: 10/14/2019 CLINICAL DATA:  Cough, fever EXAM: PORTABLE CHEST 1 VIEW COMPARISON:  Chest radiograph dated 12/01/2018 FINDINGS: The heart is enlarged. Vascular calcifications are seen in the aortic arch. Bibasilar interstitial and airspace opacities are noted. There is no pleural effusion or pneumothorax. The osseous structures are intact. IMPRESSION: 1. Bibasilar interstitial and airspace opacities. This likely represents a combination of underlying chronic interstitial lung disease with superimposed aspiration/pneumonia. 2. Cardiomegaly. Aortic Atherosclerosis (ICD10-I70.0). Electronically Signed   By: Zerita Boers M.D.   On: 10/14/2019 18:14    Subjective: Alert awake not in acute distress, frail, elderly and sick looking. No new complaints has poor appetite.  Discharge Exam: Vitals:   10/26/19 0500 10/26/19 1148  BP: 113/85 130/66  Pulse: (!) 51 (!) 52  Resp: 18 19  Temp: 98.1 F (36.7 C) 98.1 F (36.7 C)  SpO2: 95% 97%   Vitals:   10/25/19 1800 10/25/19 2004 10/26/19 0500 10/26/19 1148  BP:  (!) 143/77 113/85 130/66  Pulse:  (!) 51 (!) 51 (!) 52  Resp:  18 18 19   Temp:  (!) 97.3 F (36.3 C) 98.1 F (36.7 C) 98.1 F (36.7 C)  TempSrc:  Oral Oral Oral  SpO2:  96% 95% 97%  Weight: 66.7 kg     Height:        General: Pt is alert, awake, elderly, frail, on room air not in acute distress Cardiovascular: RRR, S1/S2 +, no rubs, no gallops Respiratory: CTA bilaterally, no wheezing, no rhonchi Abdominal: Soft, NT, ND, bowel sounds + Extremities: no edema, no cyanosis   The results of significant diagnostics from this hospitalization (including imaging, microbiology, ancillary and laboratory) are listed below for reference.     Microbiology: No results found for this or any  previous visit (from the past 240 hour(s)).   Labs: BNP (last 3 results) No results for input(s): BNP in the last 8760 hours. Basic Metabolic Panel: Recent Labs  Lab 10/20/19 0330 10/22/19 0354 10/24/19 0424  NA 136 135 136  K 3.4* 3.2* 4.4  CL 103 103 104  CO2 21* 21* 20*  GLUCOSE 176* 179* 182*  BUN 33* 31* 38*  CREATININE 1.03 0.76 0.99  CALCIUM 8.2* 8.0* 8.5*   Liver Function Tests: No results for input(s): AST, ALT, ALKPHOS, BILITOT, PROT, ALBUMIN in the last 168 hours. No results for input(s): LIPASE, AMYLASE in the last 168 hours. No results for input(s): AMMONIA in the last 168 hours. CBC: Recent Labs  Lab 10/20/19 0330  10/24/19 0424  WBC 5.1 8.9  HGB 14.2 14.5  HCT 42.0 43.3  MCV 88.1 88.7  PLT 181 230   Cardiac Enzymes: No results for input(s): CKTOTAL, CKMB, CKMBINDEX, TROPONINI in the last 168 hours. BNP: Invalid input(s): POCBNP CBG: No results for input(s): GLUCAP in the last 168 hours. D-Dimer No results for input(s): DDIMER in the last 72 hours. Hgb A1c No results for input(s): HGBA1C in the last 72 hours. Lipid Profile No results for input(s): CHOL, HDL, LDLCALC, TRIG, CHOLHDL, LDLDIRECT in the last 72 hours. Thyroid function studies Recent Labs    10/26/19 1325  TSH 1.064   Anemia work up No results for input(s): VITAMINB12, FOLATE, FERRITIN, TIBC, IRON, RETICCTPCT in the last 72 hours. Urinalysis    Component Value Date/Time   COLORURINE YELLOW 12/01/2018 1850   APPEARANCEUR CLEAR 12/01/2018 1850   LABSPEC 1.023 12/01/2018 1850   PHURINE 6.0 12/01/2018 1850   GLUCOSEU 50 (A) 12/01/2018 1850   HGBUR NEGATIVE 12/01/2018 1850   BILIRUBINUR NEGATIVE 12/01/2018 1850   KETONESUR 5 (A) 12/01/2018 1850   PROTEINUR 30 (A) 12/01/2018 1850   UROBILINOGEN 1.0 08/05/2015 1155   NITRITE NEGATIVE 12/01/2018 1850   LEUKOCYTESUR NEGATIVE 12/01/2018 1850   Sepsis Labs Invalid input(s): PROCALCITONIN,  WBC,  LACTICIDVEN Microbiology No results  found for this or any previous visit (from the past 240 hour(s)).  Time coordinating discharge: 35 minutes  SIGNED:  Antonieta Pert, MD  [facility] 10/26/2019, 2:56 PM  If 7PM-7AM, please contact night-coverage [email or web address]

## 2019-10-26 NOTE — Progress Notes (Signed)
OT Cancellation Note  Patient Details Name: Victor Clarke MRN: [id number] DOB: Apr 20, 1929   Cancelled Treatment:    Reason Eval/Treat Not Completed: Other (comment).  RN reported that pt nearly passed out getting onto 3;1 commode earlier. Not eating/drinking. Palliative consulted. Will check back to see if we need to continue or sign off  [name] 10/26/2019, 11:25 AM  [name], OTR/L Acute Rehabilitation Services [phone] [facility] pager [phone] office 10/26/2019

## 2019-10-26 NOTE — Consult Note (Signed)
Consultation Note Date: 10/26/2019   Patient Name: Victor Clarke  DOB: [birth year]  MRN: [id number]  Age / Sex: 85 y.o., male  PCP: Sherald Hess., MD Referring Physician: Antonieta Pert, MD  Reason for Consultation: Disposition  HPI/Patient Profile: 85 y.o. male  with past medical history of peripheral neuropathy, copd, hypertension, hyperlipidemia admitted on 10/14/2019 with lightheadedness, fatigue, fever, cough, COVID-19+ .   Clinical Assessment and Goals of Care: Patient was admitted to hospital medicine service at [facility].  He underwent treatment for pneumonia due to COVID-19 virus with remdesivir, antibiotics and Decadron.  Hospital course complicated by orthostatic hypotension for which the patient underwent cosyntropin test as well as cardiology consultation.  Patient has underlying sinus bradycardia and abnormal thyroid function tests.  Palliative medicine consultation for determining appropriate disposition was requested.  Discussed with RN case manager as well as chart reviewed.  Patient is awake alert resting in bed.  He is a little hard of hearing.  He does not appear to be in any acute distress.  Palliative medicine is specialized medical care for people living with serious illness. It focuses on providing relief from the symptoms and stress of a serious illness. The goal is to improve quality of life for both the patient and the family.  Goals of care: Broad aims of medical therapy in relation to the patient's values and preferences. Our aim is to provide medical care aimed at enabling patients to achieve the goals that matter most to them, given the circumstances of their particular medical situation and their constraints.   Patient is able to answer questions appropriately.  He does not appear to have any acute symptoms such as pain or shortness of breath.  He is not confused.   I reviewed with the patient about his current hospitalization.  He denies cough, denies shortness of breath.  He wants to find out what his appropriate discharge plan is going to be.  He states that he is from [facility].  See below.  NEXT OF KIN  brother   SUMMARY OF RECOMMENDATIONS    Recommend discharge back to ALF with hospice, patient, in my opinion doesn't appear to be residential hospice appropriate: he is awake/alert, doesn't have symptom burden, has reasonable PO intake.   Patient however could to have a life expectancy of less than 6 months, thus making him hospice eligible, due to age, recent pneumonia due to COVID-19 virus, underlying past medical conditions. Thank you for the consult.   Code Status/Advance Care Planning:  DNR    Symptom Management:    no acute needs at the moment.   Palliative Prophylaxis:   Delirium Protocol   Psycho-social/Spiritual:   Desire for further Chaplaincy support:yes  Additional Recommendations: Caregiving  Support/Resources  Prognosis:   < 3 months  Discharge Planning: recommend discharge back to ALF with hospice.       Primary Diagnoses: Present on Admission: . Pneumonia due to COVID-19 virus . Hypertension . Hypercholesteremia . Cognitive impairment .  Hyponatremia   I have reviewed the medical record, interviewed the patient and family, and examined the patient. The following aspects are pertinent.  Past Medical History:  Diagnosis Date  . Alcoholic peripheral neuropathy ([place])   . Allergy   . Anemia   . Arthritis   . Cataract    left eye cataract removed  . COPD (chronic obstructive pulmonary disease) ([place])    pt denies 02-03-17  . Dehydration   . Detached retina    a. s/p surgical correction on the right.  . Ectopic atrial tachycardia ([place])    a. 08/2014 Echo: EF 55-60%, mildly dil LA.  Marland Kitchen EtOH dependence ([place])   . Fall   . Falls frequently 11/2018  . Gout   . Gout   .  Hypercholesteremia   . Hypertension   . Left inguinal hernia    a. s/p repair ~ 10 yrs ago.  . Malnutrition ([place])   . Orthostatic hypotension   . Parkinson's disease ([place])   . Prostate cancer ([place])    a. s/p TURP.   Social History   Socioeconomic History  . Marital status: Widowed    Spouse name: Not on file  . Number of children: 0  . Years of education: College  . Highest education level: Not on file  Occupational History  . Occupation: Retired    Fish farm manager: OTHER  Social Needs  . Financial resource strain: Not on file  . Food insecurity    Worry: Not on file    Inability: Not on file  . Transportation needs    Medical: Not on file    Non-medical: Not on file  Tobacco Use  . Smoking status: Former Smoker    Packs/day: 2.00    Years: 22.00    Pack years: 44.00    Types: Cigarettes    Quit date: 11/18/1977    Years since quitting: 41.9  . Smokeless tobacco: Never Used  Substance and Sexual Activity  . Alcohol use: No    Alcohol/week: 2.0 standard drinks    Types: 1 Shots of liquor, 1 Standard drinks or equivalent per week    Comment: update 01/05/2019 none in past month   . Drug use: No  . Sexual activity: Never  Lifestyle  . Physical activity    Days per week: Not on file    Minutes per session: Not on file  . Stress: Not on file  Relationships  . Social Herbalist on phone: Not on file    Gets together: Not on file    Attends religious service: Not on file    Active member of club or organization: Not on file    Attends meetings of clubs or organizations: Not on file    Relationship status: Not on file  Other Topics Concern  . Not on file  Social History Narrative   Patient lives at an assisted living facility, [facility].  Has no children.     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     Education: college degree.   Caffeine Use: 1 cup occasionally   Family History  Problem Relation Age of Onset  . Stroke Father         died @ 41.  Marland Kitchen Heart attack Mother        died @ 7  . Stroke Brother        died in his 52's  . Heart attack Brother  died in his 79's  . Colon cancer Neg Hx   . Esophageal cancer Neg Hx   . Rectal cancer Neg Hx   . Stomach cancer Neg Hx   . Prostate cancer Neg Hx    Scheduled Meds: . aspirin EC  81 mg Oral Q M,W,F  . cholecalciferol  1,000 Units Oral Daily  . enoxaparin (LOVENOX) injection  40 mg Subcutaneous Daily  . escitalopram  10 mg Oral Daily  . feeding supplement (ENSURE ENLIVE)  237 mL Oral TID BM  . fludrocortisone  0.1 mg Oral Daily  . hydrocortisone   Topical BID  . magnesium oxide  400 mg Oral Daily  . midodrine  5 mg Oral TID WC  . multivitamin with minerals  1 tablet Oral Daily  . potassium chloride  20 mEq Oral Daily  . rosuvastatin  10 mg Oral QHS   Continuous Infusions: PRN Meds:.acetaminophen, albuterol, chlorpheniramine-HYDROcodone, guaiFENesin-dextromethorphan, lip balm, loperamide, ondansetron **OR** ondansetron (ZOFRAN) IV Medications Prior to Admission:  Prior to Admission medications   Medication Sig Start Date End Date Taking? Authorizing Provider  amLODipine-benazepril (LOTREL) 5-20 MG capsule Take 1 capsule by mouth daily.   Yes [provider]  aspirin EC 81 MG tablet Take 81 mg by mouth every Monday, Wednesday, and Friday.    Yes [provider]  cholecalciferol (VITAMIN D3) 25 MCG (1000 UT) tablet Take 1,000 Units by mouth daily.   Yes [provider]  [employer] Extract 2 % SHAM Apply 1 application topically 2 (two) times a week.   Yes [provider]  escitalopram (LEXAPRO) 10 MG tablet Take 10 mg by mouth daily.   Yes [provider]  Magnesium Oxide 250 MG TABS Take 500 mg by mouth daily.   Yes [provider]  miconazole (MICOTIN) 2 % cream Apply 1 application topically daily as needed (rash, jock itch).   Yes [provider]  Multiple Vitamin (MULTIVITAMIN) tablet Take 1  tablet by mouth daily.   Yes [provider]  nystatin cream (MYCOSTATIN) Apply topically 2 (two) times daily. Apply on bilateral groins Patient taking differently: Apply 1 application topically 2 (two) times daily as needed for dry skin. Apply on bilateral groins 12/09/18  Yes Adhikari, Tamsen Meek, MD  nystatin-triamcinolone (MYCOLOG II) cream Apply 1 application topically 2 (two) times daily.   Yes [provider]  rosuvastatin (CRESTOR) 10 MG tablet Take 10 mg by mouth at bedtime.  03/17/18  Yes [provider]  triamcinolone cream (KENALOG) 0.1 % Apply 1 application topically 2 (two) times daily.   Yes [provider]  VITAMIN D PO Take 1,000 Units by mouth daily.   Yes [provider]  albuterol (VENTOLIN HFA) 108 (90 Base) MCG/ACT inhaler Inhale 2 puffs into the lungs 3 (three) times daily. 10/18/19   Manuella Ghazi, Pratik D, DO  ALPRAZolam Duanne Moron) 0.25 MG tablet Take 0.5 tablets (0.125 mg total) by mouth 2 (two) times daily. 10/18/19   Manuella Ghazi, Pratik D, DO  guaiFENesin-dextromethorphan (ROBITUSSIN DM) 100-10 MG/5ML syrup Take 10 mLs by mouth every 4 (four) hours as needed for cough. 10/18/19   Manuella Ghazi, Pratik D, DO   No Known Allergies Review of Systems Denies pain  Physical Exam Awake alert resting in bed Regular work of breathing No edema Nonfocal Abdomen not distended  Vital Signs: BP 130/66 (BP Location: Right Arm)   Pulse (!) 52   Temp 98.1 F (36.7 C) (Oral)   Resp 19   Ht 5\' 6"  (1.676  m)   Wt 66.7 kg   SpO2 97%   BMI 23.73 kg/m  Pain Scale: 0-10   Pain Score: 0-No pain   SpO2: SpO2: 97 % O2 Device:SpO2: 97 % O2 Flow Rate: .O2 Flow Rate (L/min): 0 L/min  IO: Intake/output summary:   Intake/Output Summary (Last 24 hours) at 10/26/2019 1334 Last data filed at 10/26/2019 1300 Gross per 24 hour  Intake 800 ml  Output 675 ml  Net 125 ml    LBM: Last BM Date: 10/26/19 Baseline Weight: Weight: 73.2 kg Most recent weight: Weight: 66.7 kg      Palliative Assessment/Data:   PPS 50%  Time In:  12 Time Out:  1300 Time Total:  60  Greater than 50%  of this time was spent counseling and coordinating care related to the above assessment and plan.  Signed by: Loistine Chance, MD   Please contact Palliative Medicine Team phone at [phone] for questions and concerns.  For individual provider: See Shea Evans

## 2019-10-26 NOTE — Progress Notes (Signed)
Message left on the on call phone for palliative care.

## 2020-01-27 ENCOUNTER — Telehealth: Payer: Self-pay | Admitting: Family Medicine

## 2020-01-27 ENCOUNTER — Ambulatory Visit (INDEPENDENT_AMBULATORY_CARE_PROVIDER_SITE_OTHER): Admitting: Family Medicine

## 2020-01-27 ENCOUNTER — Other Ambulatory Visit: Payer: Self-pay

## 2020-01-27 ENCOUNTER — Encounter: Payer: Self-pay | Admitting: Family Medicine

## 2020-01-27 VITALS — BP 130/78 | HR 87 | Temp 97.7°F | Ht 66.0 in | Wt 153.4 lb

## 2020-01-27 DIAGNOSIS — F0390 Unspecified dementia without behavioral disturbance: Secondary | ICD-10-CM | POA: Diagnosis not present

## 2020-01-27 NOTE — Telephone Encounter (Signed)
error 

## 2020-01-27 NOTE — Progress Notes (Addendum)
PATIENT: Victor Clarke DOB: [birth year]  REASON FOR VISIT: follow up HISTORY FROM: patient  Chief Complaint  Patient presents with  . Follow-up    6 mon f/u. Alone. Rm 1. No new concerns at this time.      HISTORY OF PRESENT ILLNESS: Today 01/27/20 Victor Clarke is a 86 y.o. male here today for follow up for cognitive impairment. Patient is here alone and having difficulty providing history. He continues to reside at [facility]. He reports that he is eating well. Review of chart shows weight is fairly stable, although there has been a 6 pound weight loss since 07/2019. No pain. He denies alcohol use. He does not drive.  He seems to be very concerned that we have taken his driving privileges away.  He is requesting that we reevaluate his ability to drive.  Review of epic reveals hospitalization with Covid in November 2020.  Patient claims that this was not an accurate diagnosis.  HISTORY: (copied from Victor Clarke note on 08/05/2019)   Mr. Victor Clarke is an 86 year old male with a history of cognitive impairment.  He returns today for follow-up.  [facility] is [facility] living at [facility].  He reports that he has not had any alcohol since the beginning of the year.  He is here today alone.  He reports that his memory has remained stable.  He is able to complete all ADLs independently.  He states that he eats all his meals at [facility].  Denies any significant changes with his mood or behavior.  He does not operate a motor vehicle.  The patient spends a lot of the visit discussing his driving privileges.  He feels that they were revoked unnecessarily.  He denies any new issues.  He returns today for evaluation.   HISTORY (copied from Dr. Cathren Clarke note)  Interval history January 05, 2019: Patient is here for follow-up. I saw him back in 2017. In the meantime he is also seen other neurologist including Dr. Narda Clarke at [facility] who is an excellent physician.  He is here and  doesn't know why he is here. He says he doesn't use alcohol and he has never had a problem with alcohol. He presented to the emergency room 12/01/2018 after a fall with elevated ethanol levels in the blood. Gait difficulty thought to be due to alcoholism and thiamine deficiency. MRi of the brain did not show anything aute. Started on high-dose Thiamine, tsh and b-12 were normal. Long history of alcohol abuse. He is being restricted from driving and he is upset, that he is a danger on the road.   He declines another driving test. He declines repeat memory testing which was recommended. He is quiyte angry he cannot drive, he has poor insight into the dangers he poses  REVIEW OF SYSTEMS: Out of a complete 14 system review of symptoms, the patient complains only of the following symptoms, none and all other reviewed systems are negative.   ALLERGIES: No Known Allergies  HOME MEDICATIONS: Outpatient Medications Prior to Visit  Medication Sig Dispense Refill  . acetaminophen (TYLENOL) 325 MG tablet Take 650 mg by mouth every 4 (four) hours as needed.    Marland Kitchen albuterol (VENTOLIN HFA) 108 (90 Base) MCG/ACT inhaler Inhale 2 puffs into the lungs 3 (three) times daily. 8 g 0  . ALPRAZolam (XANAX) 0.25 MG tablet Take 0.5 tablets (0.125 mg total) by mouth 2 (two) times daily. 10 tablet 0  . aspirin EC 81 MG tablet  Take 81 mg by mouth every Monday, Wednesday, and Friday.     . cholecalciferol (VITAMIN D3) 25 MCG (1000 UT) tablet Take 1,000 Units by mouth daily.    . divalproex (DEPAKOTE SPRINKLE) 125 MG capsule Take 125 mg by mouth daily.    . Ensure (ENSURE) Take 237 mLs by mouth 3 (three) times daily between meals.    Marland Kitchen escitalopram (LEXAPRO) 20 MG tablet Take 20 mg by mouth daily.    Marland Kitchen guaiFENesin-dextromethorphan (ROBITUSSIN DM) 100-10 MG/5ML syrup Take 10 mLs by mouth every 4 (four) hours as needed for cough. 118 mL 0  . Magnesium Oxide 250 MG TABS Take 500 mg by mouth daily.    . miconazole (MICOTIN) 2 %  cream Apply 1 application topically daily as needed (rash, jock itch).    . Multiple Vitamin (MULTIVITAMIN) tablet Take 1 tablet by mouth daily.    Marland Kitchen nystatin cream (MYCOSTATIN) Apply topically 2 (two) times daily. Apply on bilateral groins 30 g 0  . rosuvastatin (CRESTOR) 10 MG tablet Take 10 mg by mouth at bedtime.   1  . triamcinolone cream (KENALOG) 0.1 % Apply 1 application topically 2 (two) times daily.    Marland Kitchen escitalopram (LEXAPRO) 10 MG tablet Take 10 mg by mouth at bedtime.     Marland Kitchen nystatin-triamcinolone (MYCOLOG II) cream Apply 1 application topically 2 (two) times daily.    Marland Kitchen [employer] Extract 2 % SHAM Apply 1 application topically 2 (two) times a week.    . fludrocortisone (FLORINEF) 0.1 MG tablet Take 100 mcg by mouth daily.    Marland Kitchen VITAMIN D PO Take 1,000 Units by mouth daily.     No facility-administered medications prior to visit.    PAST MEDICAL HISTORY: Past Medical History:  Diagnosis Date  . Alcoholic peripheral neuropathy ([place])   . Allergy   . Anemia   . Arthritis   . Cataract    left eye cataract removed  . COPD (chronic obstructive pulmonary disease) ([place])    pt denies 02-03-17  . Dehydration   . Detached retina    a. s/p surgical correction on the right.  . Ectopic atrial tachycardia ([place])    a. 08/2014 Echo: EF 55-60%, mildly dil LA.  Marland Kitchen EtOH dependence ([place])   . Fall   . Falls frequently 11/2018  . Gout   . Gout   . Hypercholesteremia   . Hypertension   . Left inguinal hernia    a. s/p repair ~ 10 yrs ago.  . Malnutrition ([place])   . Orthostatic hypotension   . Parkinson's disease ([place])   . Prostate cancer ([place])    a. s/p TURP.    PAST SURGICAL HISTORY: Past Surgical History:  Procedure Laterality Date  . CATARACT EXTRACTION     on eye, possibly right eye  . COLONOSCOPY    . COLONOSCOPY W/ POLYPECTOMY  2001, 2006  . HERNIA REPAIR    . PROSTATE SURGERY      FAMILY HISTORY: Family History  Problem Relation Age of Onset  . Stroke Father         died @ 12.  Marland Kitchen Heart attack Mother        died @ 28  . Stroke Brother        died in his 32's  . Heart attack Brother        died in his 70's  . Colon cancer Neg Hx   . Esophageal cancer Neg Hx   . Rectal cancer Neg Hx   .  Stomach cancer Neg Hx   . Prostate cancer Neg Hx     SOCIAL HISTORY: Social History   Socioeconomic History  . Marital status: Widowed    Spouse name: Not on file  . Number of children: 0  . Years of education: College  . Highest education level: Not on file  Occupational History  . Occupation: Retired    Fish farm manager: OTHER  Tobacco Use  . Smoking status: Former Smoker    Packs/day: 2.00    Years: 22.00    Pack years: 44.00    Types: Cigarettes    Quit date: 11/18/1977    Years since quitting: 42.2  . Smokeless tobacco: Never Used  Substance and Sexual Activity  . Alcohol use: No    Alcohol/week: 2.0 standard drinks    Types: 1 Shots of liquor, 1 Standard drinks or equivalent per week    Comment: update 01/05/2019 none in past month   . Drug use: No  . Sexual activity: Never  Other Topics Concern  . Not on file  Social History Narrative   Patient lives at an assisted living facility, [facility].  Has no children.     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     Education: college degree.   Caffeine Use: 1 cup occasionally   Social Determinants of Health   Financial Resource Strain:   . Difficulty of Paying Living Expenses:   Food Insecurity:   . Worried About Charity fundraiser in the Last Year:   . Arboriculturist in the Last Year:   Transportation Needs:   . Film/video editor (Medical):   Marland Kitchen Lack of Transportation (Non-Medical):   Physical Activity:   . Days of Exercise per Week:   . Minutes of Exercise per Session:   Stress:   . Feeling of Stress :   Social Connections:   . Frequency of Communication with Friends and Family:   . Frequency of Social Gatherings with Friends and Family:   . Attends Religious  Services:   . Active Member of Clubs or Organizations:   . Attends Archivist Meetings:   Marland Kitchen Marital Status:   Intimate Partner Violence:   . Fear of Current or Ex-Partner:   . Emotionally Abused:   Marland Kitchen Physically Abused:   . Sexually Abused:       PHYSICAL EXAM  Vitals:   01/27/20 1330  BP: 130/78  Pulse: 87  Temp: 97.7 F (36.5 C)  TempSrc: Oral  Weight: 153 lb 6.4 oz (69.6 kg)  Height: 5\' 6"  (1.676 m)   Body mass index is 24.76 kg/m.  Generalized: Well developed, in no acute distress  Cardiology: normal rate and rhythm, no murmur noted Respiratory: clear to auscultation bilaterally  Neurological examination  Mentation: Alert, he is oriented to day of week, month and year but unable to tell me the correct season or specific date. He is oriented to place and some history taking but does have conversation not related to today's visit and seems confused about why he is here. He is requesting to been evaluated so that he can get his license back. Follows intermittent commands speech and language fluent Cranial nerve II-XII: Pupils were equal round reactive to light. Extraocular movements were full, visual field were full on confrontational test. Facial sensation and strength were normal. Uvula tongue midline. Head turning and shoulder shrug  were normal and symmetric. Motor: The motor testing reveals 5 over 5 strength of  all 4 extremities. Good symmetric motor tone is noted throughout.  Sensory: Sensory testing is intact to soft touch on all 4 extremities. No evidence of extinction is noted.  Coordination: Cerebellar testing reveals mildly ataxic finger-nose-finger and heel-to-shin bilaterally.  Gait and station: Gait is wide but stable with cane   DIAGNOSTIC DATA (LABS, IMAGING, TESTING) - I reviewed patient records, labs, notes, testing and imaging myself where available.  MMSE - Mini Mental State Exam 01/27/2020 08/05/2019 07/24/2017  Not completed: (No Data) - Unable  to complete  Orientation to time 3 3 -  Orientation to Place 3 4 -  Registration 3 3 -  Attention/ Calculation 1 5 -  Recall 0 0 -  Language- name 2 objects 2 2 -  Language- repeat 0 1 -  Language- repeat-comments he said if ands or buts - -  Language- follow 3 step command 2 3 -  Language- follow 3 step command-comments he didnt fold the paper - -  Language- read & follow direction 1 1 -  Language-read & follow direction-comments - named 10 animals -  Write a sentence 0 1 -  Copy design 0 0 -  Total score 15 23 -     Lab Results  Component Value Date   WBC 8.9 10/24/2019   HGB 14.5 10/24/2019   HCT 43.3 10/24/2019   MCV 88.7 10/24/2019   PLT 230 10/24/2019      Component Value Date/Time   NA 136 10/24/2019 0424   NA 143 06/30/2018 1021   K 4.4 10/24/2019 0424   CL 104 10/24/2019 0424   CO2 20 (L) 10/24/2019 0424   GLUCOSE 182 (H) 10/24/2019 0424   BUN 38 (H) 10/24/2019 0424   BUN 19 06/30/2018 1021   CREATININE 0.99 10/24/2019 0424   CALCIUM 8.5 (L) 10/24/2019 0424   PROT 5.4 (L) 10/17/2019 0432   ALBUMIN 2.7 (L) 10/17/2019 0432   AST 33 10/17/2019 0432   ALT 17 10/17/2019 0432   ALKPHOS 31 (L) 10/17/2019 0432   BILITOT 0.4 10/17/2019 0432   GFRNONAA >60 10/24/2019 0424   GFRAA >60 10/24/2019 0424   Lab Results  Component Value Date   CHOL 167 06/30/2018   HDL 45 06/30/2018   LDLCALC 105 (H) 06/30/2018   TRIG 77 10/14/2019   CHOLHDL 3.7 06/30/2018   Lab Results  Component Value Date   HGBA1C 5.4 12/01/2018   Lab Results  Component Value Date   VITAMINB12 363 12/01/2018   Lab Results  Component Value Date   TSH 1.064 10/26/2019       ASSESSMENT AND PLAN 86 y.o. year old male  has a past medical history of Alcoholic peripheral neuropathy ([place]), Allergy, Anemia, Arthritis, Cataract, COPD (chronic obstructive pulmonary disease) ([place]), Dehydration, Detached retina, Ectopic atrial tachycardia ([place]), EtOH dependence ([place]), Fall, Falls frequently  (11/2018), Gout, Gout, Hypercholesteremia, Hypertension, Left inguinal hernia, Malnutrition ([place]), Orthostatic hypotension, Parkinson's disease ([place]), and Prostate cancer ([place]). here with     ICD-10-CM   1. Dementia without behavioral disturbance, unspecified dementia type ([place])  F03.90     Curly presents today for follow-up.  Unfortunately, he is alone.  Cognitive impairment makes it very difficult to obtain an accurate HPI today.  There is a noted decline in MMSE today in comparison to previous visit with [facility] in September 2020.  We could consider adding Namenda, however, I would like to make contact with [facility] to discuss patient's condition with caregivers.  I will also speak with Dr.  Jaynee Eagles and update her on this case.  We will continue current treatment plan for now.  He is not currently on any memory medications.  I recommend well-balanced diet and regular physical activity as tolerated.  We will discussed fall precautions with the [facility].  We will follow-up pending assessment with caregiver and discussion with Dr. Jaynee Eagles.   No orders of the defined types were placed in this encounter.    No orders of the defined types were placed in this encounter.     I spent 30 minutes with the patient. 50% of this time was spent counseling and educating patient on plan of care and medications.    Debbora Presto, FNP-C 01/27/2020, 4:26 PM [facility] [address] [place], [place] [address] [phone]  Made any corrections needed, and agree with history, physical, neuro exam,assessment and plan as stated.     Sarina Ill, MD [facility]

## 2020-01-27 NOTE — Patient Instructions (Signed)
  Continue current treatment plan   Follow up closely with PCP as directed.   Follow up with [place] as needed   Memory Compensation Strategies  1. Use "WARM" strategy.  W= write it down  A= associate it  R= repeat it  M= make a mental note  2.   You can keep a Social worker.  Use a 3-ring notebook with sections for the following: calendar, important names and phone numbers,  medications, doctors' names/phone numbers, lists/reminders, and a section to journal what you did  each day.   3.    Use a calendar to write appointments down.  4.    Write yourself a schedule for the day.  This can be placed on the calendar or in a separate section of the Memory Notebook.  Keeping a  regular schedule can help memory.  5.    Use medication organizer with sections for each day or morning/evening pills.  You may need help loading it  6.    Keep a basket, or pegboard by the door.  Place items that you need to take out with you in the basket or on the pegboard.  You may also want to  include a message board for reminders.  7.    Use sticky notes.  Place sticky notes with reminders in a place where the task is performed.  For example: " turn off the  stove" placed by the stove, "lock the door" placed on the door at eye level, " take your medications" on  the bathroom mirror or by the place where you normally take your medications.  8.    Use alarms/timers.  Use while cooking to remind yourself to check on food or as a reminder to take your medicine, or as a  reminder to make a call, or as a reminder to perform another task, etc.

## 2020-10-06 ENCOUNTER — Emergency Department (HOSPITAL_COMMUNITY)

## 2020-10-06 ENCOUNTER — Emergency Department (HOSPITAL_COMMUNITY)
Admission: EM | Admit: 2020-10-06 | Discharge: 2020-10-06 | Disposition: A | Discharge status: Home / Self Care | Attending: Emergency Medicine | Admitting: Emergency Medicine

## 2020-10-06 ENCOUNTER — Other Ambulatory Visit: Payer: Self-pay

## 2020-10-06 ENCOUNTER — Encounter (HOSPITAL_COMMUNITY): Payer: Self-pay

## 2020-10-06 DIAGNOSIS — J449 Chronic obstructive pulmonary disease, unspecified: Secondary | ICD-10-CM | POA: Insufficient documentation

## 2020-10-06 DIAGNOSIS — Z87891 Personal history of nicotine dependence: Secondary | ICD-10-CM | POA: Insufficient documentation

## 2020-10-06 DIAGNOSIS — S0181XA Laceration without foreign body of other part of head, initial encounter: Secondary | ICD-10-CM | POA: Insufficient documentation

## 2020-10-06 DIAGNOSIS — S0093XA Contusion of unspecified part of head, initial encounter: Secondary | ICD-10-CM | POA: Insufficient documentation

## 2020-10-06 DIAGNOSIS — Z20822 Contact with and (suspected) exposure to covid-19: Secondary | ICD-10-CM | POA: Insufficient documentation

## 2020-10-06 DIAGNOSIS — F0390 Unspecified dementia without behavioral disturbance: Secondary | ICD-10-CM | POA: Diagnosis not present

## 2020-10-06 DIAGNOSIS — Z7982 Long term (current) use of aspirin: Secondary | ICD-10-CM | POA: Diagnosis not present

## 2020-10-06 DIAGNOSIS — R509 Fever, unspecified: Secondary | ICD-10-CM | POA: Diagnosis not present

## 2020-10-06 DIAGNOSIS — J181 Lobar pneumonia, unspecified organism: Secondary | ICD-10-CM | POA: Insufficient documentation

## 2020-10-06 DIAGNOSIS — S0990XA Unspecified injury of head, initial encounter: Secondary | ICD-10-CM | POA: Diagnosis present

## 2020-10-06 DIAGNOSIS — Z8616 Personal history of COVID-19: Secondary | ICD-10-CM | POA: Diagnosis not present

## 2020-10-06 DIAGNOSIS — Y92828 Other wilderness area as the place of occurrence of the external cause: Secondary | ICD-10-CM | POA: Diagnosis not present

## 2020-10-06 DIAGNOSIS — S0031XA Abrasion of nose, initial encounter: Secondary | ICD-10-CM

## 2020-10-06 DIAGNOSIS — W19XXXA Unspecified fall, initial encounter: Secondary | ICD-10-CM | POA: Diagnosis not present

## 2020-10-06 DIAGNOSIS — Z8546 Personal history of malignant neoplasm of prostate: Secondary | ICD-10-CM | POA: Insufficient documentation

## 2020-10-06 DIAGNOSIS — J189 Pneumonia, unspecified organism: Secondary | ICD-10-CM

## 2020-10-06 LAB — COMPREHENSIVE METABOLIC PANEL
ALT: 10 U/L (ref 0–44)
AST: 17 U/L (ref 15–41)
Albumin: 3.4 g/dL — ABNORMAL LOW (ref 3.5–5.0)
Alkaline Phosphatase: 46 U/L (ref 38–126)
Anion gap: 11 (ref 5–15)
BUN: 14 mg/dL (ref 8–23)
CO2: 25 mmol/L (ref 22–32)
Calcium: 9 mg/dL (ref 8.9–10.3)
Chloride: 102 mmol/L (ref 98–111)
Creatinine, Ser: 1.21 mg/dL (ref 0.61–1.24)
GFR, Estimated: 57 mL/min — ABNORMAL LOW (ref >60)
Glucose, Bld: 107 mg/dL — ABNORMAL HIGH (ref 70–99)
Potassium: 4 mmol/L (ref 3.5–5.1)
Sodium: 138 mmol/L (ref 135–145)
Total Bilirubin: 1.1 mg/dL (ref 0.3–1.2)
Total Protein: 6.3 g/dL — ABNORMAL LOW (ref 6.5–8.1)

## 2020-10-06 LAB — PROTIME-INR
INR: 1.3 — ABNORMAL HIGH (ref 0.8–1.2)
Prothrombin Time: 15.6 seconds — ABNORMAL HIGH (ref 11.4–15.2)

## 2020-10-06 LAB — URINALYSIS, ROUTINE W REFLEX MICROSCOPIC
Bilirubin Urine: NEGATIVE
Glucose, UA: NEGATIVE mg/dL
Hgb urine dipstick: NEGATIVE
Ketones, ur: NEGATIVE mg/dL
Leukocytes,Ua: NEGATIVE
Nitrite: NEGATIVE
Protein, ur: NEGATIVE mg/dL
Specific Gravity, Urine: 1.025 (ref 1.005–1.030)
pH: 5 (ref 5.0–8.0)

## 2020-10-06 LAB — CBC WITH DIFFERENTIAL/PLATELET
Abs Immature Granulocytes: 0.03 10*3/uL (ref 0.00–0.07)
Basophils Absolute: 0 10*3/uL (ref 0.0–0.1)
Basophils Relative: 1 %
Eosinophils Absolute: 0.1 10*3/uL (ref 0.0–0.5)
Eosinophils Relative: 2 %
HCT: 44 % (ref 39.0–52.0)
Hemoglobin: 13.9 g/dL (ref 13.0–17.0)
Immature Granulocytes: 1 %
Lymphocytes Relative: 13 %
Lymphs Abs: 0.8 10*3/uL (ref 0.7–4.0)
MCH: 29.6 pg (ref 26.0–34.0)
MCHC: 31.6 g/dL (ref 30.0–36.0)
MCV: 93.8 fL (ref 80.0–100.0)
Monocytes Absolute: 0.7 10*3/uL (ref 0.1–1.0)
Monocytes Relative: 11 %
Neutro Abs: 4.4 10*3/uL (ref 1.7–7.7)
Neutrophils Relative %: 72 %
Platelets: 170 10*3/uL (ref 150–400)
RBC: 4.69 MIL/uL (ref 4.22–5.81)
RDW: 13.8 % (ref 11.5–15.5)
WBC: 6 10*3/uL (ref 4.0–10.5)
nRBC: 0 % (ref 0.0–0.2)

## 2020-10-06 LAB — APTT: aPTT: 39 seconds — ABNORMAL HIGH (ref 24–36)

## 2020-10-06 LAB — RESPIRATORY PANEL BY RT PCR (FLU A&B, COVID)
Influenza A by PCR: NEGATIVE
Influenza B by PCR: NEGATIVE
SARS Coronavirus 2 by RT PCR: NEGATIVE

## 2020-10-06 LAB — LACTIC ACID, PLASMA: Lactic Acid, Venous: 1.8 mmol/L (ref 0.5–1.9)

## 2020-10-06 MED ORDER — SODIUM CHLORIDE 0.9 % IV SOLN
1.0000 g | Freq: Once | INTRAVENOUS | Status: AC
  Administered 2020-10-06: 1 g via INTRAVENOUS
  Filled 2020-10-06: qty 10

## 2020-10-06 MED ORDER — DOXYCYCLINE HYCLATE 100 MG PO TABS
100.0000 mg | ORAL_TABLET | Freq: Once | ORAL | Status: AC
  Administered 2020-10-06: 100 mg via ORAL
  Filled 2020-10-06: qty 1

## 2020-10-06 MED ORDER — CEFTRIAXONE SODIUM 1 G IJ SOLR: 1.0000 g | Freq: Once | INTRAMUSCULAR | Status: DC

## 2020-10-06 MED ORDER — DOXYCYCLINE HYCLATE 100 MG PO CAPS: 100.0000 mg | ORAL_CAPSULE | Freq: Two times a day (BID) | ORAL | 0 refills | Status: DC

## 2020-10-06 NOTE — ED Provider Notes (Signed)
[facility] EMERGENCY DEPARTMENT Provider Note   CSN: [id number] Arrival date & time: 10/06/20  1257     History Chief Complaint  Patient presents with  . Fall  . Fever    Victor Clarke is a 86 y.o. male.  Victor Clarke is a 86 y.o. male with a history of hypertension, hyperlipidemia, atrial tachycardia, Parkinson's, dementia, prostate cancer, COPD, who presents to the emergency department via EMS from [facility] for evaluation after an unwitnessed fall.  Facility reported to EMS unknown downtime, but patient estimates that he was on the ground 15 to 20 minutes at most.  He states that he is not sure exactly what made him fall.  He states that he thinks he moved too quickly and suddenly felt a bit dizzy and then fell forward striking his face.  Patient sustained a small laceration to the forehead as well as an abrasion to the nose without significant deformity of the nose.  Patient denies feeling any dizziness now, no lightheadedness.  Reports mild frontal headache.  Denies any numbness tingling or weakness.  Patient does have a history of frequent falls.  EMS also reported patient was found to be febrile to 101.1 on arrival.  Patient denies known fevers over the past few days.  He denies focal infectious symptoms.  Denies cough, chest pain or shortness of breath.  Denies any abdominal pain, nausea, vomiting or diarrhea.  Denies any burning or pain with urination.  Denies any wounds.  Called and spoke with patient's niece [name] who is power of attorney, she did not know of any fevers or symptoms the past few days.        Past Medical History:  Diagnosis Date  . Alcoholic peripheral neuropathy ([place])   . Allergy   . Anemia   . Arthritis   . Cataract    left eye cataract removed  . COPD (chronic obstructive pulmonary disease) ([place])    pt denies 02-03-17  . Dehydration   . Detached retina    a. s/p surgical correction on the right.    . Ectopic atrial tachycardia ([place])    a. 08/2014 Echo: EF 55-60%, mildly dil LA.  Marland Kitchen EtOH dependence ([place])   . Fall   . Falls frequently 11/2018  . Gout   . Gout   . Hypercholesteremia   . Hypertension   . Left inguinal hernia    a. s/p repair ~ 10 yrs ago.  . Malnutrition ([place])   . Orthostatic hypotension   . Parkinson's disease ([place])   . Prostate cancer ([place])    a. s/p TURP.    Patient Active Problem List   Diagnosis Date Noted  . Pneumonia due to COVID-19 virus 10/14/2019  . Hyponatremia 10/14/2019  . Cognitive impairment 01/05/2019  . Problematic consumption of alcohol 12/01/2018  . Tooth impaction 12/01/2018  . Ataxia 12/01/2018  . First degree AV block   . Hypotension due to hypovolemia   . Syncope 06/18/2018  . Leukopenia 06/18/2018  . AV block, [place] II 06/18/2018  . Memory loss 10/22/2016  . Malnutrition of moderate degree 11/15/2015  . Fall from steps 11/07/2015  . Anemia 11/07/2015  . Alcohol dependence ([place]) 11/07/2015  . Pyuria 11/07/2015  . Dehydration 08/08/2015  . UTI (urinary tract infection) 08/08/2015  . High anion gap metabolic acidosis 50/93/2671  . Dizziness 07/31/2015  . Orthostatic hypotension 07/31/2015  . Lactic acidosis 07/31/2015  . Ectopic atrial tachycardia ([place])   .  Hypercholesteremia   . Dyspnea on exertion 09/16/2014  . Hypertension 09/16/2014    Past Surgical History:  Procedure Laterality Date  . CATARACT EXTRACTION     on eye, possibly right eye  . COLONOSCOPY    . COLONOSCOPY W/ POLYPECTOMY  2001, 2006  . HERNIA REPAIR    . PROSTATE SURGERY         Family History  Problem Relation Age of Onset  . Stroke Father        died @ 82.  Marland Kitchen Heart attack Mother        died @ 18  . Stroke Brother        died in his 50's  . Heart attack Brother        died in his 47's  . Colon cancer Neg Hx   . Esophageal cancer Neg Hx   . Rectal cancer Neg Hx   . Stomach cancer Neg Hx   . Prostate cancer Neg Hx     Social History    Tobacco Use  . Smoking status: Former Smoker    Packs/day: 2.00    Years: 22.00    Pack years: 44.00    Types: Cigarettes    Quit date: 11/18/1977    Years since quitting: 42.9  . Smokeless tobacco: Never Used  Vaping Use  . Vaping Use: Never used  Substance Use Topics  . Alcohol use: No    Alcohol/week: 2.0 standard drinks    Types: 1 Shots of liquor, 1 Standard drinks or equivalent per week    Comment: update 01/05/2019 none in past month   . Drug use: No    Home Medications Prior to Admission medications   Medication Sig Start Date End Date Taking? Authorizing Provider  acetaminophen (TYLENOL) 325 MG tablet Take 650 mg by mouth every 4 (four) hours as needed.    [provider]  albuterol (VENTOLIN HFA) 108 (90 Base) MCG/ACT inhaler Inhale 2 puffs into the lungs 3 (three) times daily. 10/18/19   Manuella Ghazi, Pratik D, DO  ALPRAZolam Duanne Moron) 0.25 MG tablet Take 0.5 tablets (0.125 mg total) by mouth 2 (two) times daily. 10/18/19   Heath Lark D, DO  aspirin EC 81 MG tablet Take 81 mg by mouth every Monday, Wednesday, and Friday.     [provider]  cholecalciferol (VITAMIN D3) 25 MCG (1000 UT) tablet Take 1,000 Units by mouth daily.    [provider]  divalproex (DEPAKOTE SPRINKLE) 125 MG capsule Take 125 mg by mouth daily.    [provider]  doxycycline (VIBRAMYCIN) 100 MG capsule Take 1 capsule (100 mg total) by mouth 2 (two) times daily. 10/06/20   Jacqlyn Larsen, PA-C  Ensure (ENSURE) Take 237 mLs by mouth 3 (three) times daily between meals.    [provider]  escitalopram (LEXAPRO) 20 MG tablet Take 20 mg by mouth daily.    [provider]  guaiFENesin-dextromethorphan (ROBITUSSIN DM) 100-10 MG/5ML syrup Take 10 mLs by mouth every 4 (four) hours as needed for cough. 10/18/19   Manuella Ghazi, Pratik D, DO  Magnesium Oxide 250 MG TABS Take 500 mg by mouth daily.    [provider]  miconazole (MICOTIN) 2 % cream Apply 1  application topically daily as needed (rash, jock itch).    [provider]  Multiple Vitamin (MULTIVITAMIN) tablet Take 1 tablet by mouth daily.    [provider]  nystatin cream (MYCOSTATIN) Apply topically 2 (two) times daily. Apply on bilateral groins  12/09/18   Shelly Coss, MD  rosuvastatin (CRESTOR) 10 MG tablet Take 10 mg by mouth at bedtime.  03/17/18   [provider]  triamcinolone cream (KENALOG) 0.1 % Apply 1 application topically 2 (two) times daily.    [provider]    Allergies    Patient has no known allergies.  Review of Systems   Review of Systems  Constitutional: Negative for chills and fever.  HENT: Negative.   Eyes: Negative for visual disturbance.  Respiratory: Negative for cough and shortness of breath.   Cardiovascular: Negative for chest pain.  Gastrointestinal: Negative for abdominal pain, diarrhea, nausea and vomiting.  Genitourinary: Negative for dysuria and frequency.  Musculoskeletal: Negative for arthralgias and myalgias.  Skin: Negative for color change and rash.  Neurological: Positive for headaches. Negative for dizziness, facial asymmetry, weakness, light-headedness and numbness.    Physical Exam Updated Vital Signs BP (!) 185/88   Pulse 74   Temp 98.7 F (37.1 C) (Oral)   Resp 19   Ht 5\' 6"  (1.676 m)   Wt 70.8 kg   SpO2 96%   BMI 25.18 kg/m   Physical Exam Vitals and nursing note reviewed.  Constitutional:      General: He is not in acute distress.    Appearance: Normal appearance. He is well-developed. He is not ill-appearing or diaphoretic.     Comments: Elderly gentleman alert and oriented, in no acute distress  HENT:     Head: Normocephalic.     Comments: Abrasion and two small lacerations to the right side of the forehead with small surrounding hematoma, no hematoma, deformity or step-off over the scalp, negative battle sign    Nose:     Comments: Abrasion to the left side of the nose  without significant deformity, slight swelling, no epistaxis    Mouth/Throat:     Mouth: Mucous membranes are moist.     Pharynx: Oropharynx is clear.     Comments: No tenderness over the jaw, no malocclusion, posterior oropharynx clear Eyes:     General:        Right eye: No discharge.        Left eye: No discharge.     Extraocular Movements: Extraocular movements intact.     Pupils: Pupils are equal, round, and reactive to light.  Neck:     Comments: C-collar in place, no focal midline tenderness on exam, no ecchymosis, step-off or deformity Cardiovascular:     Rate and Rhythm: Normal rate and regular rhythm.     Heart sounds: Normal heart sounds. No murmur heard.  No friction rub. No gallop.   Pulmonary:     Effort: Pulmonary effort is normal. No respiratory distress.     Breath sounds: Normal breath sounds. No wheezing or rales.     Comments: Respirations equal and unlabored, patient able to speak in full sentences, lungs clear to auscultation bilaterally, no chest wall tenderness Abdominal:     General: Bowel sounds are normal. There is no distension.     Palpations: Abdomen is soft. There is no mass.     Tenderness: There is no abdominal tenderness. There is no guarding.     Comments: Abdomen soft, nondistended, nontender to palpation in all quadrants without guarding or peritoneal signs  Musculoskeletal:        General: No deformity.     Cervical back: Neck supple. Tenderness present.     Comments: T-spine and L-spine nontender to palpation at midline.No tenderness or instability over  the pelvis Patient moves all extremities without difficulty. All joints supple and easily movable, no erythema, swelling or palpable deformity, all compartments soft.   Skin:    General: Skin is warm and dry.     Capillary Refill: Capillary refill takes less than 2 seconds.  Neurological:     Mental Status: He is alert.     Coordination: Coordination normal.     Comments: Speech is clear,  able to follow commands CN III-XII intact Normal strength in upper and lower extremities bilaterally including dorsiflexion and plantar flexion, strong and equal grip strength Sensation normal to light and sharp touch Moves extremities without ataxia, coordination intact      ED Results / Procedures / Treatments   Labs (all labs ordered are listed, but only abnormal results are displayed) Labs Reviewed  COMPREHENSIVE METABOLIC PANEL - Abnormal; Notable for the following components:      Result Value   Glucose, Bld 107 (*)    Total Protein 6.3 (*)    Albumin 3.4 (*)    GFR, Estimated 57 (*)    All other components within normal limits  PROTIME-INR - Abnormal; Notable for the following components:   Prothrombin Time 15.6 (*)    INR 1.3 (*)    All other components within normal limits  APTT - Abnormal; Notable for the following components:   aPTT 39 (*)    All other components within normal limits  URINALYSIS, ROUTINE W REFLEX MICROSCOPIC - Abnormal; Notable for the following components:   Color, Urine AMBER (*)    APPearance HAZY (*)    All other components within normal limits  RESPIRATORY PANEL BY RT PCR (FLU A&B, COVID)  CULTURE, BLOOD (SINGLE)  URINE CULTURE  LACTIC ACID, PLASMA  CBC WITH DIFFERENTIAL/PLATELET    EKG EKG Interpretation  Date/Time:  Friday October 06 2020 13:06:28 EST Ventricular Rate:  65 PR Interval:    QRS Duration: 114 QT Interval:  449 QTC Calculation: 467 R Axis:   -65 Text Interpretation: Sinus or ectopic atrial rhythm Prolonged PR interval Left anterior fascicular block Abnormal R-wave progression, late transition Abnormal T, consider ischemia, lateral leads No STEMI Confirmed by Octaviano Glow [phone]) on 10/06/2020 1:49:30 PM   Radiology CT Head Wo Contrast  Result Date: 10/06/2020 CLINICAL DATA:  Trauma. Fall. Laceration to forehead. Abrasion to nose. EXAM: CT HEAD WITHOUT CONTRAST CT MAXILLOFACIAL WITHOUT CONTRAST CT CERVICAL SPINE  WITHOUT CONTRAST TECHNIQUE: Multidetector CT imaging of the head, cervical spine, and maxillofacial structures were performed using the standard protocol without intravenous contrast. Multiplanar CT image reconstructions of the cervical spine and maxillofacial structures were also generated. COMPARISON:  CT head September 18, 2018. CT cervical spine 12/01/2018 FINDINGS: CT HEAD FINDINGS Brain: No evidence of acute infarction, hemorrhage, hydrocephalus, extra-axial collection or mass lesion/mass effect. Similar generalized cerebral volume loss with ex vacuo ventricular dilation. Similar white matter hypoattenuation, compatible with chronic microvascular ischemic disease. Vascular: Calcific atherosclerosis. Skull: Right frontal scalp contusion without acute fracture. Other: No mastoid effusions. CT MAXILLOFACIAL FINDINGS Osseous: Mild deformity of the right nasal bone, which appears similar to 06/18/2018. No acute fracture or mandibular dislocation. No destructive process. Orbits: Negative. No traumatic or inflammatory finding. Sinuses: Mild bilateral inferior maxillary sinus mucosal thickening. Remaining sinuses are clear. Soft tissues: Right frontal scalp contusion CT CERVICAL SPINE FINDINGS Alignment: Similar mild anterolisthesis of C2 on C3. Mild broad levocurvature. No new/traumatic subluxation. Skull base and vertebrae: No acute fracture. No primary bone lesion or focal pathologic process. Soft tissues  and spinal canal: No prevertebral fluid or swelling. No visible canal hematoma. Disc levels: Similar moderate to severe degenerative disc disease at C3-C4 through C6-C7 with partial osseous fusion across disc spaces at C4-C5 and C5-C6. Upper chest: Emphysema. Other: Calcific atherosclerosis of the carotids. IMPRESSION: 1. No evidence of acute intracranial abnormality. 2. Right frontal scalp contusion without acute fracture. 3. No evidence of acute fracture or traumatic malalignment in the cervical spine.  Electronically Signed   By: Margaretha Sheffield MD   On: 10/06/2020 15:38   CT Cervical Spine Wo Contrast  Result Date: 10/06/2020 CLINICAL DATA:  Trauma. Fall. Laceration to forehead. Abrasion to nose. EXAM: CT HEAD WITHOUT CONTRAST CT MAXILLOFACIAL WITHOUT CONTRAST CT CERVICAL SPINE WITHOUT CONTRAST TECHNIQUE: Multidetector CT imaging of the head, cervical spine, and maxillofacial structures were performed using the standard protocol without intravenous contrast. Multiplanar CT image reconstructions of the cervical spine and maxillofacial structures were also generated. COMPARISON:  CT head September 18, 2018. CT cervical spine 12/01/2018 FINDINGS: CT HEAD FINDINGS Brain: No evidence of acute infarction, hemorrhage, hydrocephalus, extra-axial collection or mass lesion/mass effect. Similar generalized cerebral volume loss with ex vacuo ventricular dilation. Similar white matter hypoattenuation, compatible with chronic microvascular ischemic disease. Vascular: Calcific atherosclerosis. Skull: Right frontal scalp contusion without acute fracture. Other: No mastoid effusions. CT MAXILLOFACIAL FINDINGS Osseous: Mild deformity of the right nasal bone, which appears similar to 06/18/2018. No acute fracture or mandibular dislocation. No destructive process. Orbits: Negative. No traumatic or inflammatory finding. Sinuses: Mild bilateral inferior maxillary sinus mucosal thickening. Remaining sinuses are clear. Soft tissues: Right frontal scalp contusion CT CERVICAL SPINE FINDINGS Alignment: Similar mild anterolisthesis of C2 on C3. Mild broad levocurvature. No new/traumatic subluxation. Skull base and vertebrae: No acute fracture. No primary bone lesion or focal pathologic process. Soft tissues and spinal canal: No prevertebral fluid or swelling. No visible canal hematoma. Disc levels: Similar moderate to severe degenerative disc disease at C3-C4 through C6-C7 with partial osseous fusion across disc spaces at C4-C5 and  C5-C6. Upper chest: Emphysema. Other: Calcific atherosclerosis of the carotids. IMPRESSION: 1. No evidence of acute intracranial abnormality. 2. Right frontal scalp contusion without acute fracture. 3. No evidence of acute fracture or traumatic malalignment in the cervical spine. Electronically Signed   By: Margaretha Sheffield MD   On: 10/06/2020 15:38   DG Chest Port 1 View  Result Date: 10/06/2020 CLINICAL DATA:  Fall, fever.  Question sepsis EXAM: PORTABLE CHEST 1 VIEW COMPARISON:  10/14/2019 FINDINGS: Bibasilar airspace disease has progressed in the interval. No significant effusion Mild cardiac enlargement without heart failure. Apical emphysema with COPD. Atherosclerotic aortic arch. IMPRESSION: COPD.  Progression of bibasilar airspace disease possible pneumonia. Electronically Signed   By: Franchot Gallo M.D.   On: 10/06/2020 14:18   CT Maxillofacial WO CM  Result Date: 10/06/2020 CLINICAL DATA:  Trauma. Fall. Laceration to forehead. Abrasion to nose. EXAM: CT HEAD WITHOUT CONTRAST CT MAXILLOFACIAL WITHOUT CONTRAST CT CERVICAL SPINE WITHOUT CONTRAST TECHNIQUE: Multidetector CT imaging of the head, cervical spine, and maxillofacial structures were performed using the standard protocol without intravenous contrast. Multiplanar CT image reconstructions of the cervical spine and maxillofacial structures were also generated. COMPARISON:  CT head September 18, 2018. CT cervical spine 12/01/2018 FINDINGS: CT HEAD FINDINGS Brain: No evidence of acute infarction, hemorrhage, hydrocephalus, extra-axial collection or mass lesion/mass effect. Similar generalized cerebral volume loss with ex vacuo ventricular dilation. Similar white matter hypoattenuation, compatible with chronic microvascular ischemic disease. Vascular: Calcific atherosclerosis. Skull: Right frontal scalp  contusion without acute fracture. Other: No mastoid effusions. CT MAXILLOFACIAL FINDINGS Osseous: Mild deformity of the right nasal bone, which  appears similar to 06/18/2018. No acute fracture or mandibular dislocation. No destructive process. Orbits: Negative. No traumatic or inflammatory finding. Sinuses: Mild bilateral inferior maxillary sinus mucosal thickening. Remaining sinuses are clear. Soft tissues: Right frontal scalp contusion CT CERVICAL SPINE FINDINGS Alignment: Similar mild anterolisthesis of C2 on C3. Mild broad levocurvature. No new/traumatic subluxation. Skull base and vertebrae: No acute fracture. No primary bone lesion or focal pathologic process. Soft tissues and spinal canal: No prevertebral fluid or swelling. No visible canal hematoma. Disc levels: Similar moderate to severe degenerative disc disease at C3-C4 through C6-C7 with partial osseous fusion across disc spaces at C4-C5 and C5-C6. Upper chest: Emphysema. Other: Calcific atherosclerosis of the carotids. IMPRESSION: 1. No evidence of acute intracranial abnormality. 2. Right frontal scalp contusion without acute fracture. 3. No evidence of acute fracture or traumatic malalignment in the cervical spine. Electronically Signed   By: Margaretha Sheffield MD   On: 10/06/2020 15:38    Procedures Procedures (including critical care time)  Medications Ordered in ED Medications  cefTRIAXone (ROCEPHIN) 1 g in sodium chloride 0.9 % 100 mL IVPB (1 g Intravenous New Bag/Given 10/06/20 1620)  doxycycline (VIBRA-TABS) tablet 100 mg (100 mg Oral Given 10/06/20 1622)    ED Course  I have reviewed the triage vital signs and the nursing notes.  Pertinent labs & imaging results that were available during my care of the patient were reviewed by me and considered in my medical decision making (see chart for details).    MDM Rules/Calculators/A&P                         86 year old male presents after unwitnessed fall, sustained small lacerations to right side of the forehead as well as an abrasion to the nose.  Aside from mild pain over the head patient denies any other pain or  injuries from fall.  He was also found to have a fever on EMS arrival, he was given Tylenol and on arrival to the ED he is not febrile and all other vitals are normal.  Patient is well-appearing and in no distress.  Will get CTs of the head, cervical spine and maxillofacial bones.  For evaluation of reported fever will check basic labs, lactic acid, single blood culture, chest x-ray and urinalysis.  Patient with no focal infectious symptoms.  No abdominal tenderness, vomiting or diarrhea.  We will also check Covid and flu swab.  We will continue to monitor vitals here in the ED.  ED evaluation very reassuring.  Labs with no leukocytosis, no significant electrolyte derangements, normal renal and liver function.  Urinalysis without signs of infection, lactic acid is not elevated.  Covid and flu tests are negative.  Chest x-ray does show possible bilateral lower lobe pneumonia, given fever, feel it would be reasonable to treat with antibiotics.  Patient given dose of Rocephin in the ED as well as doxycycline and will send home with prescription for doxycycline.  CT scans of the head, cervical spine and maxillofacial bones without evidence of acute traumatic injury aside from small right forehead hematoma.  2 small lacerations were closed with Dermabond, discussed wound care and return precautions.  Also called and updated patient's niece [name] who is power of attorney.  Patient appears to be at baseline mental status, no additional fever or abnormal vitals here in the ED.  No hypoxia, cough, tachypnea or increased work of breathing.  Feel he is stable for discharge back to skilled nursing facility with strict return precautions.  Patient discussed with Dr. Langston Masker, who saw patient as well and agrees with plan.   Final Clinical Impression(s) / ED Diagnoses Final diagnoses:  Fall, initial encounter  Laceration of forehead, initial encounter  Abrasion of nose, initial encounter  Community acquired  bilateral lower lobe pneumonia    Rx / DC Orders ED Discharge Orders         Ordered    doxycycline (VIBRAMYCIN) 100 MG capsule  2 times daily        10/06/20 1611           Jacqlyn Larsen, Hershal Coria 10/06/20 2202    Wyvonnia Dusky, MD 10/07/20 [phone]

## 2020-10-06 NOTE — ED Triage Notes (Addendum)
Pt from [facility] facility for an unwitnessed fall with syncope. Pt has laceration to forehead and abrasion to nose. Pt is not on a blood thinner. EMS also reports pt had a fever of 101, given tylenol en route.  Pt alert, oriented x3. VSS. C collar in place.

## 2020-10-06 NOTE — ED Notes (Signed)
Patient verbalizes understanding of discharge instructions. Opportunity for questioning and answers were provided. Armband removed by staff, pt discharged from ED via [place].

## 2020-10-06 NOTE — ED Notes (Signed)
Patient transported to CT 

## 2020-10-06 NOTE — Discharge Instructions (Signed)
Please take antibiotics twice daily for the next week to treat for mild pneumonia seen on your chest x-ray.  If you develop worsening fevers, cough, chest pain or shortness of breath you should return for reevaluation.  The rest of your work-up today was reassuring, we do not see signs of urinary tract infection or other abnormal lab work.  Your head, face and neck imaging after fall was very reassuring.  The laceration to your forehead was repaired with Dermabond glue and this will come off on its own after about 1 week.  You can shower regularly but do not scrub the area and try and avoid touching it to help keep the Dermabond in place.  Monitor for any signs of infection such as redness, swelling, increasing pain or drainage, if this occurs please return for reevaluation.

## 2020-10-06 NOTE — ED Notes (Signed)
Secretary called [name], confirmed that patient is now 3rd on the list.

## 2020-10-06 NOTE — ED Notes (Signed)
Pt to ED 40. Pt resting in bed. NADN. Awaiting [name]

## 2020-10-06 NOTE — Care Management (Addendum)
Patient is from [facility], and is noted to have the [facility] on chart, CM will continue to follow and  contact on call hospice nurse.

## 2020-10-07 LAB — URINE CULTURE: Culture: NO GROWTH

## 2020-10-11 LAB — CULTURE, BLOOD (SINGLE)
Culture: NO GROWTH
Special Requests: ADEQUATE

## 2021-11-22 ENCOUNTER — Emergency Department (HOSPITAL_COMMUNITY)

## 2021-11-22 ENCOUNTER — Other Ambulatory Visit: Payer: Self-pay

## 2021-11-22 ENCOUNTER — Inpatient Hospital Stay (HOSPITAL_COMMUNITY)
Admission: EM | Admit: 2021-11-22 | Discharge: 2021-11-26 | DRG: 064 | Disposition: A | Discharge status: Skilled Nursing Facility | Source: Skilled Nursing Facility | Attending: Family Medicine | Admitting: Family Medicine

## 2021-11-22 ENCOUNTER — Encounter (HOSPITAL_COMMUNITY): Payer: Self-pay

## 2021-11-22 DIAGNOSIS — M1990 Unspecified osteoarthritis, unspecified site: Secondary | ICD-10-CM | POA: Diagnosis present

## 2021-11-22 DIAGNOSIS — R410 Disorientation, unspecified: Secondary | ICD-10-CM | POA: Diagnosis present

## 2021-11-22 DIAGNOSIS — F0283 Dementia in other diseases classified elsewhere, unspecified severity, with mood disturbance: Secondary | ICD-10-CM | POA: Diagnosis present

## 2021-11-22 DIAGNOSIS — Z8546 Personal history of malignant neoplasm of prostate: Secondary | ICD-10-CM

## 2021-11-22 DIAGNOSIS — Z7982 Long term (current) use of aspirin: Secondary | ICD-10-CM

## 2021-11-22 DIAGNOSIS — I63441 Cerebral infarction due to embolism of right cerebellar artery: Principal | ICD-10-CM | POA: Diagnosis present

## 2021-11-22 DIAGNOSIS — J9611 Chronic respiratory failure with hypoxia: Secondary | ICD-10-CM | POA: Diagnosis present

## 2021-11-22 DIAGNOSIS — Z87891 Personal history of nicotine dependence: Secondary | ICD-10-CM

## 2021-11-22 DIAGNOSIS — J449 Chronic obstructive pulmonary disease, unspecified: Secondary | ICD-10-CM | POA: Diagnosis present

## 2021-11-22 DIAGNOSIS — I700 Atherosclerosis of aorta: Secondary | ICD-10-CM | POA: Diagnosis present

## 2021-11-22 DIAGNOSIS — Z66 Do not resuscitate: Secondary | ICD-10-CM | POA: Diagnosis present

## 2021-11-22 DIAGNOSIS — R29701 NIHSS score 1: Secondary | ICD-10-CM | POA: Diagnosis present

## 2021-11-22 DIAGNOSIS — Z20822 Contact with and (suspected) exposure to covid-19: Secondary | ICD-10-CM | POA: Diagnosis present

## 2021-11-22 DIAGNOSIS — I10 Essential (primary) hypertension: Secondary | ICD-10-CM | POA: Diagnosis present

## 2021-11-22 DIAGNOSIS — Z79899 Other long term (current) drug therapy: Secondary | ICD-10-CM

## 2021-11-22 DIAGNOSIS — G20 Parkinson's disease: Secondary | ICD-10-CM | POA: Diagnosis present

## 2021-11-22 DIAGNOSIS — Z823 Family history of stroke: Secondary | ICD-10-CM

## 2021-11-22 DIAGNOSIS — I639 Cerebral infarction, unspecified: Secondary | ICD-10-CM | POA: Diagnosis present

## 2021-11-22 DIAGNOSIS — F32A Depression, unspecified: Secondary | ICD-10-CM | POA: Diagnosis present

## 2021-11-22 DIAGNOSIS — G621 Alcoholic polyneuropathy: Secondary | ICD-10-CM | POA: Diagnosis present

## 2021-11-22 DIAGNOSIS — R471 Dysarthria and anarthria: Secondary | ICD-10-CM | POA: Diagnosis present

## 2021-11-22 DIAGNOSIS — E876 Hypokalemia: Secondary | ICD-10-CM | POA: Diagnosis present

## 2021-11-22 DIAGNOSIS — I619 Nontraumatic intracerebral hemorrhage, unspecified: Secondary | ICD-10-CM | POA: Diagnosis present

## 2021-11-22 DIAGNOSIS — F0390 Unspecified dementia without behavioral disturbance: Secondary | ICD-10-CM | POA: Diagnosis present

## 2021-11-22 DIAGNOSIS — Z8673 Personal history of transient ischemic attack (TIA), and cerebral infarction without residual deficits: Secondary | ICD-10-CM

## 2021-11-22 DIAGNOSIS — Z8249 Family history of ischemic heart disease and other diseases of the circulatory system: Secondary | ICD-10-CM

## 2021-11-22 DIAGNOSIS — Z9981 Dependence on supplemental oxygen: Secondary | ICD-10-CM

## 2021-11-22 DIAGNOSIS — E7800 Pure hypercholesterolemia, unspecified: Secondary | ICD-10-CM | POA: Diagnosis present

## 2021-11-22 DIAGNOSIS — R270 Ataxia, unspecified: Secondary | ICD-10-CM | POA: Diagnosis present

## 2021-11-22 DIAGNOSIS — R001 Bradycardia, unspecified: Secondary | ICD-10-CM | POA: Diagnosis present

## 2021-11-22 DIAGNOSIS — I951 Orthostatic hypotension: Secondary | ICD-10-CM | POA: Diagnosis present

## 2021-11-22 LAB — COMPREHENSIVE METABOLIC PANEL
ALT: 12 U/L (ref 0–44)
AST: 16 U/L (ref 15–41)
Albumin: 3.4 g/dL — ABNORMAL LOW (ref 3.5–5.0)
Alkaline Phosphatase: 41 U/L (ref 38–126)
Anion gap: 5 (ref 5–15)
BUN: 17 mg/dL (ref 8–23)
CO2: 25 mmol/L (ref 22–32)
Calcium: 8.3 mg/dL — ABNORMAL LOW (ref 8.9–10.3)
Chloride: 107 mmol/L (ref 98–111)
Creatinine, Ser: 1.03 mg/dL (ref 0.61–1.24)
GFR, Estimated: >60 mL/min (ref >60)
Glucose, Bld: 114 mg/dL — ABNORMAL HIGH (ref 70–99)
Potassium: 3.4 mmol/L — ABNORMAL LOW (ref 3.5–5.1)
Sodium: 137 mmol/L (ref 135–145)
Total Bilirubin: 1 mg/dL (ref 0.3–1.2)
Total Protein: 6.9 g/dL (ref 6.5–8.1)

## 2021-11-22 LAB — CBC WITH DIFFERENTIAL/PLATELET
Abs Immature Granulocytes: 0.05 10*3/uL (ref 0.00–0.07)
Basophils Absolute: 0 10*3/uL (ref 0.0–0.1)
Basophils Relative: 0 %
Eosinophils Absolute: 0.1 10*3/uL (ref 0.0–0.5)
Eosinophils Relative: 1 %
HCT: 38.5 % — ABNORMAL LOW (ref 39.0–52.0)
Hemoglobin: 12.6 g/dL — ABNORMAL LOW (ref 13.0–17.0)
Immature Granulocytes: 1 %
Lymphocytes Relative: 18 %
Lymphs Abs: 1.1 10*3/uL (ref 0.7–4.0)
MCH: 28.6 pg (ref 26.0–34.0)
MCHC: 32.7 g/dL (ref 30.0–36.0)
MCV: 87.5 fL (ref 80.0–100.0)
Monocytes Absolute: 0.6 10*3/uL (ref 0.1–1.0)
Monocytes Relative: 10 %
Neutro Abs: 4.1 10*3/uL (ref 1.7–7.7)
Neutrophils Relative %: 70 %
Platelets: 193 10*3/uL (ref 150–400)
RBC: 4.4 MIL/uL (ref 4.22–5.81)
RDW: 14.2 % (ref 11.5–15.5)
WBC: 5.8 10*3/uL (ref 4.0–10.5)
nRBC: 0 % (ref 0.0–0.2)

## 2021-11-22 LAB — RESP PANEL BY RT-PCR (FLU A&B, COVID) ARPGX2
Influenza A by PCR: NEGATIVE
Influenza B by PCR: NEGATIVE
SARS Coronavirus 2 by RT PCR: NEGATIVE

## 2021-11-22 LAB — VALPROIC ACID LEVEL: Valproic Acid Lvl: 10 ug/mL — ABNORMAL LOW (ref 50.0–100.0)

## 2021-11-22 LAB — URINALYSIS, ROUTINE W REFLEX MICROSCOPIC
Bacteria, UA: NONE SEEN
Bilirubin Urine: NEGATIVE
Glucose, UA: NEGATIVE mg/dL
Hgb urine dipstick: NEGATIVE
Ketones, ur: 5 mg/dL — AB
Leukocytes,Ua: NEGATIVE
Nitrite: NEGATIVE
Protein, ur: 30 mg/dL — AB
Specific Gravity, Urine: 1.024 (ref 1.005–1.030)
pH: 7 (ref 5.0–8.0)

## 2021-11-22 LAB — LACTIC ACID, PLASMA: Lactic Acid, Venous: 1.1 mmol/L (ref 0.5–1.9)

## 2021-11-22 LAB — PROTIME-INR
INR: 1.3 — ABNORMAL HIGH (ref 0.8–1.2)
Prothrombin Time: 15.9 seconds — ABNORMAL HIGH (ref 11.4–15.2)

## 2021-11-22 LAB — APTT: aPTT: 33 seconds (ref 24–36)

## 2021-11-22 MED ORDER — ACETAMINOPHEN 650 MG RE SUPP: 650.0000 mg | RECTAL | Status: DC | PRN

## 2021-11-22 MED ORDER — FLUDROCORTISONE ACETATE 0.1 MG PO TABS
0.1000 mg | ORAL_TABLET | Freq: Every morning | ORAL | Status: DC
  Administered 2021-11-23 – 2021-11-26 (×4): 0.1 mg via ORAL
  Filled 2021-11-22 (×5): qty 1

## 2021-11-22 MED ORDER — ESCITALOPRAM OXALATE 10 MG PO TABS
10.0000 mg | ORAL_TABLET | Freq: Every day | ORAL | Status: DC
  Administered 2021-11-22 – 2021-11-25 (×4): 10 mg via ORAL
  Filled 2021-11-22 (×4): qty 1

## 2021-11-22 MED ORDER — POTASSIUM CHLORIDE 10 MEQ/100ML IV SOLN
10.0000 meq | INTRAVENOUS | Status: AC
  Administered 2021-11-22 – 2021-11-23 (×3): 10 meq via INTRAVENOUS
  Filled 2021-11-22 (×3): qty 100

## 2021-11-22 MED ORDER — LACTATED RINGERS IV SOLN: INTRAVENOUS | Status: DC

## 2021-11-22 MED ORDER — ACETAMINOPHEN 160 MG/5ML PO SOLN: 650.0000 mg | ORAL | Status: DC | PRN

## 2021-11-22 MED ORDER — LORAZEPAM 0.5 MG PO TABS
0.2500 mg | ORAL_TABLET | Freq: Two times a day (BID) | ORAL | Status: DC
  Administered 2021-11-22 – 2021-11-26 (×7): 0.25 mg via ORAL
  Filled 2021-11-22 (×7): qty 1

## 2021-11-22 MED ORDER — ALBUTEROL SULFATE (2.5 MG/3ML) 0.083% IN NEBU: 2.5000 mg | INHALATION_SOLUTION | Freq: Four times a day (QID) | RESPIRATORY_TRACT | Status: DC | PRN

## 2021-11-22 MED ORDER — SENNOSIDES-DOCUSATE SODIUM 8.6-50 MG PO TABS: 1.0000 | ORAL_TABLET | Freq: Every evening | ORAL | Status: DC | PRN

## 2021-11-22 MED ORDER — STROKE: EARLY STAGES OF RECOVERY BOOK
Freq: Once | Status: DC
  Filled 2021-11-22: qty 1

## 2021-11-22 MED ORDER — HYDRALAZINE HCL 20 MG/ML IJ SOLN: 10.0000 mg | Freq: Four times a day (QID) | INTRAMUSCULAR | Status: DC | PRN

## 2021-11-22 MED ORDER — GADOBUTROL 1 MMOL/ML IV SOLN
7.0000 mL | Freq: Once | INTRAVENOUS | Status: AC | PRN
  Administered 2021-11-22: 7 mL via INTRAVENOUS

## 2021-11-22 MED ORDER — DIVALPROEX SODIUM 125 MG PO DR TAB
125.0000 mg | DELAYED_RELEASE_TABLET | Freq: Every day | ORAL | Status: DC
  Administered 2021-11-22 – 2021-11-25 (×4): 125 mg via ORAL
  Filled 2021-11-22 (×6): qty 1

## 2021-11-22 MED ORDER — ACETAMINOPHEN 325 MG PO TABS: 650.0000 mg | ORAL_TABLET | ORAL | Status: DC | PRN

## 2021-11-22 NOTE — Progress Notes (Signed)
Manufacturing [employer] [facility]) Hospital Liaison Note  Victor Clarke is a current [place] patient and will be followed by hospital liaisons while admitted.   Please call with any questions or hospice related concerns.    [facility] Liaison [phone]

## 2021-11-22 NOTE — ED Notes (Signed)
Patients niece would like a call back with an update: [name] [phone]

## 2021-11-22 NOTE — ED Triage Notes (Signed)
Pt arriving form [facility] via EMS for increased confusion from baseline in the last 24 hours different than baseline. Pt has hx of dementia.

## 2021-11-22 NOTE — ED Provider Notes (Addendum)
Patient seen after prior EDP.  Patient is comfortable.  Patient was sent in for evaluation of increased confusion.  Onset of confusion was noted earlier today.  Exact timeframe of last known normal is unclear.  Patient is reporting that he feels better than when he first arrived.  Patient was noted to have low-grade temperature of 99.9 on arrival.  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  Imaging and presentation discussed with Dr. Leonel Ramsay of neurology.  He recommends MRI brain referred to the edition of this lesion.  If patient has acute infarct on MRI patient will require transfer over to [facility].  2045 Imaging results and case discussed with covering neurologist Dr. Lorrin Goodell.  Patient to be admitted by the hospitalist service with bed placement at [facility].  Dr. Lorrin Goodell will see the patient upon their arrival at [facility].  He recommends that aspirin to be held.  Patient's systolic BP should be kept less than 180. Most recent BP is 164/58.  Hospitalist service is aware of case and will evaluate.  CRITICAL CARE Performed by: Valarie Merino   Total critical care time: 30 minutes  Critical care time was exclusive of separately billable procedures and treating other patients.  Critical care was necessary to treat or prevent imminent or life-threatening deterioration.  Critical care was time spent personally by me on the following activities: development of treatment plan with patient and/or surrogate as well as nursing, discussions with consultants, evaluation of patient's response to treatment, examination of patient, obtaining history from patient or surrogate, ordering and performing treatments and interventions, ordering and review of laboratory studies, ordering and review of radiographic studies, pulse oximetry and re-evaluation of patient's condition.    Valarie Merino, MD 11/22/21 2101    Valarie Merino, MD 11/22/21  2106

## 2021-11-22 NOTE — ED Provider Notes (Signed)
[facility] DEPT Provider Note   CSN: [id number] Arrival date & time: 11/22/21  1345     History  Chief Complaint  Patient presents with   Altered Mental Status    Victor Clarke is a 88 y.o. male.  88 year old male presents due to decreased mental status today at this facility.  Was found to have low-grade temperature.  Does have a baseline history of dementia and is on hospice at this time.  Patient himself denies any complaints currently.  He denies any cough or congestion.  No urinary symptoms.  Was brought here by EMS      Home Medications Prior to Admission medications   Medication Sig Start Date End Date Taking? Authorizing Provider  acetaminophen (TYLENOL) 325 MG tablet Take 650 mg by mouth every 4 (four) hours as needed.    [provider]  albuterol (VENTOLIN HFA) 108 (90 Base) MCG/ACT inhaler Inhale 2 puffs into the lungs 3 (three) times daily. 10/18/19   Manuella Ghazi, Pratik D, DO  ALPRAZolam Duanne Moron) 0.25 MG tablet Take 0.5 tablets (0.125 mg total) by mouth 2 (two) times daily. 10/18/19   Heath Lark D, DO  aspirin EC 81 MG tablet Take 81 mg by mouth every Monday, Wednesday, and Friday.     [provider]  cholecalciferol (VITAMIN D3) 25 MCG (1000 UT) tablet Take 1,000 Units by mouth daily.    [provider]  divalproex (DEPAKOTE SPRINKLE) 125 MG capsule Take 125 mg by mouth daily.    [provider]  doxycycline (VIBRAMYCIN) 100 MG capsule Take 1 capsule (100 mg total) by mouth 2 (two) times daily. 10/06/20   Kinnie Feil, PA-C  Ensure (ENSURE) Take 237 mLs by mouth 3 (three) times daily between meals.    [provider]  escitalopram (LEXAPRO) 20 MG tablet Take 20 mg by mouth daily.    [provider]  guaiFENesin-dextromethorphan (ROBITUSSIN DM) 100-10 MG/5ML syrup Take 10 mLs by mouth every 4 (four) hours as needed for cough. 10/18/19   Manuella Ghazi, Pratik D, DO  Magnesium Oxide 250 MG  TABS Take 500 mg by mouth daily.    [provider]  miconazole (MICOTIN) 2 % cream Apply 1 application topically daily as needed (rash, jock itch).    [provider]  Multiple Vitamin (MULTIVITAMIN) tablet Take 1 tablet by mouth daily.    [provider]  nystatin cream (MYCOSTATIN) Apply topically 2 (two) times daily. Apply on bilateral groins 12/09/18   Shelly Coss, MD  rosuvastatin (CRESTOR) 10 MG tablet Take 10 mg by mouth at bedtime.  03/17/18   [provider]  triamcinolone cream (KENALOG) 0.1 % Apply 1 application topically 2 (two) times daily.    [provider]      Allergies    Patient has no known allergies.    Review of Systems   Review of Systems  Unable to perform ROS: Dementia   Physical Exam Updated Vital Signs SpO2 95%  Physical Exam Vitals and nursing note reviewed.  Constitutional:      General: He is not in acute distress.    Appearance: Normal appearance. He is well-developed. He is not toxic-appearing.  HENT:     Head: Normocephalic and atraumatic.  Eyes:     General: Lids are normal.     Conjunctiva/sclera: Conjunctivae normal.     Pupils: Pupils are equal, round, and reactive to light.  Neck:     Thyroid: No thyroid mass.  Trachea: No tracheal deviation.  Cardiovascular:     Rate and Rhythm: Normal rate and regular rhythm.     Heart sounds: Normal heart sounds. No murmur heard.   No gallop.  Pulmonary:     Effort: Pulmonary effort is normal. No respiratory distress.     Breath sounds: Normal breath sounds. No stridor. No decreased breath sounds, wheezing, rhonchi or rales.  Abdominal:     General: There is no distension.     Palpations: Abdomen is soft.     Tenderness: There is no abdominal tenderness. There is no rebound.  Musculoskeletal:        General: No tenderness. Normal range of motion.     Cervical back: Normal range of motion and neck supple.  Skin:    General: Skin is warm and dry.      Findings: No abrasion or rash.  Neurological:     General: No focal deficit present.     Mental Status: He is alert and oriented to person, place, and time.     GCS: GCS eye subscore is 4. GCS verbal subscore is 5. GCS motor subscore is 6.     Cranial Nerves: No cranial nerve deficit.     Sensory: No sensory deficit.     Motor: Motor function is intact.  Psychiatric:        Attention and Perception: Attention normal.        Speech: Speech normal.        Behavior: Behavior normal.    ED Results / Procedures / Treatments   Labs (all labs ordered are listed, but only abnormal results are displayed) Labs Reviewed  CULTURE, BLOOD (ROUTINE X 2)  CULTURE, BLOOD (ROUTINE X 2)  URINE CULTURE  RESP PANEL BY RT-PCR (FLU A&B, COVID) ARPGX2  LACTIC ACID, PLASMA  LACTIC ACID, PLASMA  COMPREHENSIVE METABOLIC PANEL  CBC WITH DIFFERENTIAL/PLATELET  PROTIME-INR  APTT  URINALYSIS, ROUTINE W REFLEX MICROSCOPIC  VALPROIC ACID LEVEL    EKG EKG Interpretation  Date/Time:  Thursday November 22 2021 14:07:33 EST Ventricular Rate:  70 PR Interval:  302 QRS Duration: 110 QT Interval:  421 QTC Calculation: 455 R Axis:   265 Text Interpretation: Sinus or ectopic atrial rhythm Prolonged PR interval Consider left atrial enlargement Incomplete RBBB and LAFB Anterior infarct, old No significant change since last tracing Confirmed by Lacretia Leigh ([id number]) on 11/22/2021 2:14:07 PM  Radiology No results found.  Procedures Procedures    Medications Ordered in ED Medications  lactated ringers infusion (has no administration in time range)    ED Course/ Medical Decision Making/ A&P                           Medical Decision Making  Patient with altered mental status likely from infectious etiology.  Blood work is pending at this time.  Concern for possible septic.  Head CT has been ordered to evaluate for possible intracranial process.  Work-up is pending and care signed to Dr.  Francia Greaves        Final Clinical Impression(s) / ED Diagnoses Final diagnoses:  None    Rx / DC Orders ED Discharge Orders     None         Lacretia Leigh, MD 11/22/21 1507

## 2021-11-22 NOTE — H&P (Signed)
History and Physical    Victor Clarke MGN:[id number] DOB: [birth year] DOA: 11/22/2021  PCP: Sherald Hess., MD  Patient coming from: [facility]  I have personally briefly reviewed patient's old medical records in [place]  Chief Complaint: Confusion  HPI: Victor Clarke is a 88 y.o. male with medical history significant for dementia, COPD, HTN, HLD who presented to the ED for evaluation of increased confusion from baseline.  History is limited from patient due to dementia and is otherwise supplemented by EDP and chart review.  Patient currently resides at an assisted living facility.  He was noted to be increasingly confused over the last 24 hours compared to his baseline by facility staff.  He was subsequently sent to the ED for further evaluation.  Patient currently has no complaints.  Work-up revealed acute or subacute infarct in the right cerebellar hemisphere with hemorrhage as well as punctate acute infarcts in the left parietal and right frontal lobe.  ED Course:  Initial vitals showed BP 161/94, pulse 69, RR 20, temp 99.9 F rectally, SPO2 98% on room air.  Labs show WBC 5.8, hemoglobin 12.6, platelets 193,000, sodium 137, potassium 3.4, bicarb 25, BUN 17, creatinine 1.03, serum glucose 114, LFTs within normal limits, lactic acid 1.1, valproate level 10.  Blood cultures, urinalysis, urine culture ordered and pending.  SARS-CoV-2 and influenza PCR negative.  Portable chest x-ray shows chronic changes in the lung bases similar to prior imaging questioning interstitial lung disease.  CT head without contrast showed new low-density in the right cerebellar hemisphere which could represent an acute or subacute infarct.  Chronic atrophy and evidence for chronic small vessel ischemic changes noted.  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  Mild associated edema with  some mass-effect on fourth ventricle noted without evidence of obstruction or hydrocephalus.  Additional punctate acute infarcts in the left parietal and right frontal lobe noted.  Right frontal convexity meningioma also reported.  Patient was given IV fluids.  EDP discussed with on-call neurology who recommended admission to [facility], holding aspirin, and to keep SBP less than 180.  The hospitalist service was consulted to admit for further evaluation and management.  Review of Systems: All systems reviewed and are negative except as documented in history of present illness above.   Past Medical History:  Diagnosis Date   Alcoholic peripheral neuropathy (HCC)    Allergy    Anemia    Arthritis    Cataract    left eye cataract removed   COPD (chronic obstructive pulmonary disease) ([place])    pt denies 02-03-17   Dehydration    Detached retina    a. s/p surgical correction on the right.   Ectopic atrial tachycardia ([place])    a. 08/2014 Echo: EF 55-60%, mildly dil LA.   EtOH dependence ([place])    Fall    Falls frequently 11/2018   Gout    Gout    Hypercholesteremia    Hypertension    Left inguinal hernia    a. s/p repair ~ 10 yrs ago.   Malnutrition ([place])    Orthostatic hypotension    Parkinson's disease ([place])    Prostate cancer ([place])    a. s/p TURP.    Past Surgical History:  Procedure Laterality Date   CATARACT EXTRACTION     on eye, possibly right eye   COLONOSCOPY     COLONOSCOPY  W/ POLYPECTOMY  2001, 2006   HERNIA REPAIR     PROSTATE SURGERY      Social History:  reports that he quit smoking about 44 years ago. His smoking use included cigarettes. He has a 44.00 pack-year smoking history. He has never used smokeless tobacco. He reports that he does not drink alcohol and does not use drugs.  No Known Allergies  Family History  Problem Relation Age of Onset   Stroke Father        died @ 61.   Heart attack Mother        died @ 13   Stroke Brother         died in his 37's   Heart attack Brother        died in his 41's   Colon cancer Neg Hx    Esophageal cancer Neg Hx    Rectal cancer Neg Hx    Stomach cancer Neg Hx    Prostate cancer Neg Hx      Prior to Admission medications   Medication Sig Start Date End Date Taking? Authorizing Provider  acetaminophen (TYLENOL) 325 MG tablet Take 650 mg by mouth every 4 (four) hours as needed for fever.   Yes [provider]  albuterol (PROVENTIL) (2.5 MG/3ML) 0.083% nebulizer solution Take 2.5 mg by nebulization every 6 (six) hours as needed for wheezing or shortness of breath.   Yes [provider]  aspirin EC 81 MG tablet Take 81 mg by mouth every Monday, Wednesday, and Friday.    Yes [provider]  BIOFREEZE 4 % GEL Apply 1 application topically 3 (three) times daily as needed (to affected sites).   Yes [provider]  cholecalciferol (VITAMIN D3) 25 MCG (1000 UT) tablet Take 1,000 Units by mouth daily.   Yes [provider]  Coal Tar 20 % SOLN Apply 1 application topically See admin instructions. Shampoo the hair when bathing (re: Hospice)   Yes [provider]  divalproex (DEPAKOTE) 125 MG DR tablet Take 125 mg by mouth at bedtime.   Yes [provider]  escitalopram (LEXAPRO) 10 MG tablet Take 10 mg by mouth at bedtime.   Yes [provider]  fludrocortisone (FLORINEF) 0.1 MG tablet Take 0.1 mg by mouth in the morning. 11/02/21  Yes [provider]  guaiFENesin-dextromethorphan (ROBITUSSIN DM) 100-10 MG/5ML syrup Take 10 mLs by mouth every 4 (four) hours as needed for cough. 10/18/19  Yes Shah, Pratik D, DO  LORazepam (ATIVAN) 0.5 MG tablet Take 0.25 mg by mouth in the morning and at bedtime. 10/30/21  Yes [provider]  Magnesium Oxide 250 MG TABS Take 500 mg by mouth at bedtime.   Yes [provider]  miconazole (MICOTIN) 2 % cream Apply 1 application topically daily as needed (rash, jock  itch).   Yes [provider]  Multiple Vitamin (DAILY-VITE MULTIVITAMIN) TABS Take 1 tablet by mouth in the morning.   Yes [provider]  OXYGEN Inhale 2 L/min into the lungs as needed (for shortness of breath).   Yes [provider]  albuterol (VENTOLIN HFA) 108 (90 Base) MCG/ACT inhaler Inhale 2 puffs into the lungs 3 (three) times daily. Patient not taking: Reported on 11/22/2021 10/18/19   Heath Lark D, DO  ALPRAZolam Duanne Moron) 0.25 MG tablet Take 0.5 tablets (0.125 mg total) by mouth 2 (two) times daily. Patient not taking: Reported on 11/22/2021 10/18/19   Heath Lark D, DO  doxycycline (VIBRAMYCIN) 100 MG  capsule Take 1 capsule (100 mg total) by mouth 2 (two) times daily. Patient not taking: Reported on 11/22/2021 10/06/20   Kinnie Feil, PA-C  nystatin cream (MYCOSTATIN) Apply topically 2 (two) times daily. Apply on bilateral groins Patient not taking: Reported on 11/22/2021 12/09/18   Shelly Coss, MD    Physical Exam: Vitals:   11/22/21 1730 11/22/21 1745 11/22/21 1956 11/22/21 2108  BP:   (!) 164/58 (!) 158/69  Pulse: 72 67 62 (!) 55  Resp:   20 16  Temp:      TempSrc:      SpO2: 96% 95% 99% 93%  Weight:   71.2 kg   Height:   5\' 8"  (1.727 m)    Constitutional: Elderly man resting in bed, NAD, calm, comfortable Eyes: PERRL, EOMI, lids and conjunctivae normal ENMT: Mucous membranes are moist. Posterior pharynx clear of any exudate or lesions.Normal dentition.  Neck: normal, supple, no masses. Respiratory: clear to auscultation bilaterally, no wheezing, no crackles. Normal respiratory effort. No accessory muscle use.  Cardiovascular: Bradycardic with systolic murmur. No extremity edema. 2+ pedal pulses. Abdomen: no tenderness, no masses palpated. No hepatosplenomegaly. Bowel sounds positive.  Musculoskeletal: no clubbing / cyanosis. No joint deformity upper and lower extremities. Good ROM, no contractures. Normal muscle tone.  Skin: no rashes,  lesions, ulcers. No induration Neurologic: CN 2-12 grossly intact. Sensation intact. Strength 5/5 in all 4.  Unable to perform FTN due to comprehension. Psychiatric: Awake, alert, oriented to self and year but not to situation. Normal mood.   Labs on Admission: I have personally reviewed following labs and imaging studies  CBC: Recent Labs  Lab 11/22/21 1455  WBC 5.8  NEUTROABS 4.1  HGB 12.6*  HCT 38.5*  MCV 87.5  PLT 956   Basic Metabolic Panel: Recent Labs  Lab 11/22/21 1455  NA 137  K 3.4*  CL 107  CO2 25  GLUCOSE 114*  BUN 17  CREATININE 1.03  CALCIUM 8.3*   GFR: Estimated Creatinine Clearance: 46.1 mL/min (by C-G formula based on SCr of 1.03 mg/dL). Liver Function Tests: Recent Labs  Lab 11/22/21 1455  AST 16  ALT 12  ALKPHOS 41  BILITOT 1.0  PROT 6.9  ALBUMIN 3.4*   No results for input(s): LIPASE, AMYLASE in the last 168 hours. No results for input(s): AMMONIA in the last 168 hours. Coagulation Profile: Recent Labs  Lab 11/22/21 1455  INR 1.3*   Cardiac Enzymes: No results for input(s): CKTOTAL, CKMB, CKMBINDEX, TROPONINI in the last 168 hours. BNP (last 3 results) No results for input(s): PROBNP in the last 8760 hours. HbA1C: No results for input(s): HGBA1C in the last 72 hours. CBG: No results for input(s): GLUCAP in the last 168 hours. Lipid Profile: No results for input(s): CHOL, HDL, LDLCALC, TRIG, CHOLHDL, LDLDIRECT in the last 72 hours. Thyroid Function Tests: No results for input(s): TSH, T4TOTAL, FREET4, T3FREE, THYROIDAB in the last 72 hours. Anemia Panel: No results for input(s): VITAMINB12, FOLATE, FERRITIN, TIBC, IRON, RETICCTPCT in the last 72 hours. Urine analysis:    Component Value Date/Time   COLORURINE AMBER (A) 10/06/2020 1544   APPEARANCEUR HAZY (A) 10/06/2020 1544   LABSPEC 1.025 10/06/2020 1544   PHURINE 5.0 10/06/2020 [place] 10/06/2020 1544   HGBUR NEGATIVE 10/06/2020 [place]  10/06/2020 [place] 10/06/2020 1544   PROTEINUR NEGATIVE 10/06/2020 1544   UROBILINOGEN 1.0 08/05/2015 1155   NITRITE NEGATIVE 10/06/2020 1544   LEUKOCYTESUR  NEGATIVE 10/06/2020 1544    Radiological Exams on Admission: CT Head Wo Contrast  Result Date: 11/22/2021 CLINICAL DATA:  Mental status change, unknown cause. EXAM: CT HEAD WITHOUT CONTRAST TECHNIQUE: Contiguous axial images were obtained from the base of the skull through the vertex without intravenous contrast. COMPARISON:  10/06/2020 FINDINGS: Brain: New low-density involving the posteromedial aspect of the right cerebellar hemisphere. There may be minimal mass effect in this area. No evidence for acute hemorrhage. Stable cerebral atrophy with dilatation of the ventricles. The ventriculomegaly is likely secondary to atrophy. Again noted is low-density in the periventricular white matter and suggestive for chronic small vessel ischemic changes. Vascular: Atherosclerotic calcifications in the vertebral arteries. Skull: Normal. Negative for fracture or focal lesion. Sinuses/Orbits: No acute finding. Other: None. IMPRESSION: 1. New low-density in the right cerebellar hemisphere. This could represent an acute or subacute infarct. Difficult to exclude an underlying lesion and recommend further characterization with brain MRI. 2. Chronic atrophy and evidence for chronic small vessel ischemic changes. These results were called by telephone at the time of interpretation on 11/22/2021 at 4:06 pm to provider Dr. Francia Greaves, Who verbally acknowledged these results. Electronically Signed   By: Markus Daft M.D.   On: 11/22/2021 16:12   MR Brain W and Wo Contrast  Result Date: 11/22/2021 CLINICAL DATA:  Neuro deficit, stroke suspected EXAM: MRI HEAD WITHOUT AND WITH CONTRAST TECHNIQUE: Multiplanar, multiecho pulse sequences of the brain and surrounding structures were obtained without and with intravenous contrast. CONTRAST:  23mL GADAVIST GADOBUTROL  1 MMOL/ML IV SOLN COMPARISON:  12/01/2018, correlation is also made with CT head 11/22/2021 FINDINGS: Brain: Wedge-shaped area of restricted diffusion in the right cerebellum with mildly decreased ADC signal (series 5, images 52-62), likely subacute infarct with some normalization of the ADC. Decreased signal on susceptibility weighted imaging in this area (series 9, image 20), consistent with hemosiderin deposition. An area at the more lateral aspect of this lesion demonstrates more hypointense ADC correlate and is without hemosiderin deposition and may reflect a more acute infarct (series 5, image 558 and series 6, image 10). This area is associated with increased T2 signal, likely edema, which causes mild mass effect on the fourth ventricle which remains patent. Additional punctate foci of mildly increased signal on diffusion-weighted imaging with ADC correlates in the left parietal lobe (series 5, image 85) and vertex right frontal lobe (series 5, image 91), consistent with acute infarcts. Focus of hemosiderin deposition in the left medulla (series 9, image 18), likely sequela of prior hypertensive microhemorrhage. No other acute intraparenchymal hemorrhage, mass, mass effect, or midline shift. No abnormal parenchymal enhancement. Dilatation of the ventricles, which is commensurate with cerebral volume loss and sulcal size. No extra-axial collection. Along the right frontal convexity, there is an enhancing extradural mass, which measures approximately 1.5 x 0.8 x 0.4 cm (series 16, image 44 and series 17, image 21). Confluent T2 hyperintense signal in the periventricular white matter and pons, likely the sequela of severe chronic small vessel ischemic disease. Vascular: Normal flow voids. Skull and upper cervical spine: Normal marrow signal. Sinuses/Orbits: Status post left lens replacement. Otherwise negative. Other: None. IMPRESSION: 1. Acute on subacute infarct in the right cerebellar hemisphere, with the  more subacute portion demonstrating hemosiderin deposition, consistent with hemorrhage. There is mild associated edema, with some mass effect on the fourth ventricle but no evidence of obstruction or hydrocephalus. 2. Additional punctate acute infarcts in the left parietal and right frontal lobe. Given multiple vascular territories, an embolic  etiology is suspected. 3. Right frontal convexity meningioma. These results were called by telephone at the time of interpretation on 11/22/2021 at 8:01 pm to provider [facility] , who verbally acknowledged these results. Electronically Signed   By: Merilyn Baba M.D.   On: 11/22/2021 20:02   DG Chest Port 1 View  Result Date: 11/22/2021 CLINICAL DATA:  Will sepsis in a 88 year old male. EXAM: PORTABLE CHEST 1 VIEW COMPARISON:  Comparison is made with October 06, 2020. FINDINGS: EKG leads project over the chest. Trachea is midline. Cardiomediastinal contours with mild cardiac enlargement similar to prior imaging. No lobar consolidation. Chronic changes at the lung bases are similar to previous imaging. Interstitial and airspace opacities at the lung bases as before. On limited assessment there is no acute skeletal process.  Numerous IMPRESSION: 1. Chronic changes in the lung bases, similar to previous imaging. Question interstitial lung disease, it would be difficult to exclude superimposed infection. Electronically Signed   By: Zetta Bills M.D.   On: 11/22/2021 14:26    EKG: Personally reviewed. Normal sinus rhythm with first-degree AV block, incomplete RBBB and LAFB.  Not significantly changed when compared to prior.  Assessment/Plan Principal Problem:   Cerebellar stroke, acute ([place]) Active Problems:   Hypertension   Orthostatic hypotension   Dementia without behavioral disturbance (HCC)   COPD (chronic obstructive pulmonary disease) (HCC)   Acute CVA (cerebrovascular accident) ([place])   Victor Clarke is a 88 y.o. male with medical history significant  for dementia, COPD, HTN, HLD who is admitted with acute to subacute right cerebellar hemisphere infarct with hemorrhage and acute punctate left parietal and right frontal lobe infarcts.  Acute to subacute right cerebellar hemisphere infarct with hemorrhage Acute punctate left parietal and right frontal lobe infarcts: Above findings seen on MRI brain.  Neurology recommend admission to [facility] for further evaluation. -Neurology to evaluate on arrival to [place] echocardiogram -Hold aspirin and keep SBP <180 given findings concerning for hemorrhage, IV hydralazine ordered as needed -Keep on telemetry, continue neurochecks -PT/OT/SLP eval  Orthostatic hypotension: Continue home [place].  Hypokalemia: Supplement and repeat labs in AM.  COPD: Stable, continue albuterol as needed.  Dementia/anxiety/depression: Continue Lexapro, Ativan, Depakote, delirium precautions.  Resides at ALF and active with hospice.  DVT prophylaxis: SCDs Code Status: DNR Family Communication: None available on admission Disposition Plan: From [facility], dispo pending clinical progress Consults called: Neurology Level of care: Telemetry Medical Admission status:   Status is: Observation  The patient remains OBS appropriate and will d/c before 2 midnights.   Zada Finders MD [facility]  If 7PM-7AM, please contact night-coverage [email or web address]  11/22/2021, 9:28 PM

## 2021-11-22 NOTE — ED Notes (Signed)
Pt at MRI

## 2021-11-23 ENCOUNTER — Observation Stay (HOSPITAL_COMMUNITY)

## 2021-11-23 DIAGNOSIS — E7800 Pure hypercholesterolemia, unspecified: Secondary | ICD-10-CM | POA: Diagnosis present

## 2021-11-23 DIAGNOSIS — Z9981 Dependence on supplemental oxygen: Secondary | ICD-10-CM | POA: Diagnosis not present

## 2021-11-23 DIAGNOSIS — R001 Bradycardia, unspecified: Secondary | ICD-10-CM | POA: Diagnosis present

## 2021-11-23 DIAGNOSIS — R410 Disorientation, unspecified: Secondary | ICD-10-CM | POA: Diagnosis present

## 2021-11-23 DIAGNOSIS — F32A Depression, unspecified: Secondary | ICD-10-CM | POA: Diagnosis present

## 2021-11-23 DIAGNOSIS — I700 Atherosclerosis of aorta: Secondary | ICD-10-CM | POA: Diagnosis present

## 2021-11-23 DIAGNOSIS — I10 Essential (primary) hypertension: Secondary | ICD-10-CM | POA: Diagnosis present

## 2021-11-23 DIAGNOSIS — F0390 Unspecified dementia without behavioral disturbance: Secondary | ICD-10-CM | POA: Diagnosis not present

## 2021-11-23 DIAGNOSIS — Z66 Do not resuscitate: Secondary | ICD-10-CM | POA: Diagnosis present

## 2021-11-23 DIAGNOSIS — M1990 Unspecified osteoarthritis, unspecified site: Secondary | ICD-10-CM | POA: Diagnosis present

## 2021-11-23 DIAGNOSIS — I63441 Cerebral infarction due to embolism of right cerebellar artery: Secondary | ICD-10-CM | POA: Diagnosis present

## 2021-11-23 DIAGNOSIS — G621 Alcoholic polyneuropathy: Secondary | ICD-10-CM | POA: Diagnosis present

## 2021-11-23 DIAGNOSIS — I951 Orthostatic hypotension: Secondary | ICD-10-CM | POA: Diagnosis present

## 2021-11-23 DIAGNOSIS — R471 Dysarthria and anarthria: Secondary | ICD-10-CM | POA: Diagnosis present

## 2021-11-23 DIAGNOSIS — I6389 Other cerebral infarction: Secondary | ICD-10-CM | POA: Diagnosis not present

## 2021-11-23 DIAGNOSIS — Z7982 Long term (current) use of aspirin: Secondary | ICD-10-CM | POA: Diagnosis not present

## 2021-11-23 DIAGNOSIS — J449 Chronic obstructive pulmonary disease, unspecified: Secondary | ICD-10-CM | POA: Diagnosis present

## 2021-11-23 DIAGNOSIS — Z8249 Family history of ischemic heart disease and other diseases of the circulatory system: Secondary | ICD-10-CM | POA: Diagnosis not present

## 2021-11-23 DIAGNOSIS — G20 Parkinson's disease: Secondary | ICD-10-CM | POA: Diagnosis present

## 2021-11-23 DIAGNOSIS — Z20822 Contact with and (suspected) exposure to covid-19: Secondary | ICD-10-CM | POA: Diagnosis present

## 2021-11-23 DIAGNOSIS — I619 Nontraumatic intracerebral hemorrhage, unspecified: Secondary | ICD-10-CM | POA: Diagnosis present

## 2021-11-23 DIAGNOSIS — R270 Ataxia, unspecified: Secondary | ICD-10-CM | POA: Diagnosis present

## 2021-11-23 DIAGNOSIS — I639 Cerebral infarction, unspecified: Secondary | ICD-10-CM | POA: Diagnosis present

## 2021-11-23 DIAGNOSIS — R29701 NIHSS score 1: Secondary | ICD-10-CM | POA: Diagnosis present

## 2021-11-23 DIAGNOSIS — E876 Hypokalemia: Secondary | ICD-10-CM | POA: Diagnosis present

## 2021-11-23 DIAGNOSIS — F0283 Dementia in other diseases classified elsewhere, unspecified severity, with mood disturbance: Secondary | ICD-10-CM | POA: Diagnosis present

## 2021-11-23 DIAGNOSIS — J9611 Chronic respiratory failure with hypoxia: Secondary | ICD-10-CM | POA: Diagnosis present

## 2021-11-23 LAB — BASIC METABOLIC PANEL
Anion gap: 8 (ref 5–15)
BUN: 16 mg/dL (ref 8–23)
CO2: 25 mmol/L (ref 22–32)
Calcium: 8.6 mg/dL — ABNORMAL LOW (ref 8.9–10.3)
Chloride: 104 mmol/L (ref 98–111)
Creatinine, Ser: 0.93 mg/dL (ref 0.61–1.24)
GFR, Estimated: >60 mL/min (ref >60)
Glucose, Bld: 101 mg/dL — ABNORMAL HIGH (ref 70–99)
Potassium: 3.7 mmol/L (ref 3.5–5.1)
Sodium: 137 mmol/L (ref 135–145)

## 2021-11-23 LAB — CBC
HCT: 38.7 % — ABNORMAL LOW (ref 39.0–52.0)
Hemoglobin: 12.6 g/dL — ABNORMAL LOW (ref 13.0–17.0)
MCH: 28.7 pg (ref 26.0–34.0)
MCHC: 32.6 g/dL (ref 30.0–36.0)
MCV: 88.2 fL (ref 80.0–100.0)
Platelets: 192 10*3/uL (ref 150–400)
RBC: 4.39 MIL/uL (ref 4.22–5.81)
RDW: 14.1 % (ref 11.5–15.5)
WBC: 6.8 10*3/uL (ref 4.0–10.5)
nRBC: 0 % (ref 0.0–0.2)

## 2021-11-23 LAB — LIPID PANEL
Cholesterol: 137 mg/dL (ref 0–200)
HDL: 26 mg/dL — ABNORMAL LOW (ref >40)
LDL Cholesterol: 94 mg/dL (ref 0–99)
Total CHOL/HDL Ratio: 5.3 RATIO
Triglycerides: 85 mg/dL (ref <150)
VLDL: 17 mg/dL (ref 0–40)

## 2021-11-23 LAB — HEMOGLOBIN A1C
Hgb A1c MFr Bld: 5.7 % — ABNORMAL HIGH (ref 4.8–5.6)
Mean Plasma Glucose: 116.89 mg/dL

## 2021-11-23 LAB — ECHOCARDIOGRAM COMPLETE
AR max vel: 1.31 cm2
AV Area VTI: 1.2 cm2
AV Area mean vel: 1.16 cm2
AV Mean grad: 7.8 mmHg
AV Peak grad: 13.9 mmHg
AV Vena cont: 0.2 cm
Ao pk vel: 1.87 m/s
Area-P 1/2: 3.12 cm2
Height: 68 in
S' Lateral: 2.4 cm
Weight: 2512 oz

## 2021-11-23 LAB — LACTIC ACID, PLASMA: Lactic Acid, Venous: 1.7 mmol/L (ref 0.5–1.9)

## 2021-11-23 MED ORDER — THIAMINE HCL 100 MG PO TABS
100.0000 mg | ORAL_TABLET | Freq: Every day | ORAL | Status: DC
  Administered 2021-11-24 – 2021-11-26 (×3): 100 mg via ORAL
  Filled 2021-11-23 (×3): qty 1

## 2021-11-23 MED ORDER — IOHEXOL 350 MG/ML SOLN
80.0000 mL | Freq: Once | INTRAVENOUS | Status: AC | PRN
  Administered 2021-11-23: 80 mL via INTRAVENOUS

## 2021-11-23 MED ORDER — THIAMINE HCL 100 MG/ML IJ SOLN
100.0000 mg | Freq: Every day | INTRAMUSCULAR | Status: DC
  Administered 2021-11-23: 100 mg via INTRAVENOUS
  Filled 2021-11-23 (×3): qty 2

## 2021-11-23 MED ORDER — SODIUM CHLORIDE (PF) 0.9 % IJ SOLN
INTRAMUSCULAR | Status: AC
  Filled 2021-11-23: qty 50

## 2021-11-23 NOTE — Progress Notes (Signed)
PROGRESS NOTE    Victor Clarke  KGM:[id number] DOB: 1929/04/13 DOA: 11/22/2021  PCP: Sherald Hess., MD   Brief Narrative: This 88 years old male with PMH significant for dementia, COPD, hypertension, hyperlipidemia presented in ED for the evaluation of increased confusion from baseline.  History is limited since patient has dementia history is obtained from ED note.  Patient currently resides at assisted living facility,  noted to be increasingly confused over the last 24 hours and compared to the baseline.  Work-up in the ED revealed acute or subacute infarct in the right cerebellar hemisphere with hemorrhagic conversion as well as punctate acute infarct in the left parietal and right frontal lobe.  Neurology is consulted,  recommended complete stroke work-up.  Assessment & Plan:   Principal Problem:   Cerebellar stroke, acute ([place]) Active Problems:   Hypertension   Orthostatic hypotension   Dementia without behavioral disturbance (HCC)   COPD (chronic obstructive pulmonary disease) (HCC)   Acute CVA (cerebrovascular accident) ([place])  Acute /subacute right cerebellar hemisphere infarct with hemorrhage: Acute punctate left parietal and right frontal lobe infarct: Patient was found more confused at assisted facility. MRI confirms acute strokes with hemorrhage. Neurology recommended complete  stroke work-up. Hold aspirin, keep SBP< 180 given finding concerning for hemorrhage. Frequent neuro checks every 4 hours. Obtain 2D echocardiogram, carotid duplex N.p.o. until speech evaluation. PT and OT evaluation.  Orthostatic hypotension: Continue home [place]  Hypokalemia: Replaced, continue to monitor  COPD: Stable, continue albuterol as needed  Anxiety and depression: Continue Ativan, Lexapro, Depakote.  Dementia: Continue delirium precautions Patient resides at assisted living and active with hospice   DVT prophylaxis: SCDs Code Status: DNR Family Communication: No  family at bed side. Disposition Plan:   Status is: Inpatient  Remains inpatient appropriate because:  Admitted for acute stroke with hemorrhage.  Requiring complete stroke work-up.   Anticipated discharge to skilled nursing facility in few days.    Consultants:   Neurology  Procedures: CT head, MRI, echocardiogram, carotid duplex  Antimicrobials: None.  Subjective: Patient was seen and examined at bedside.  Overnight events noted.   Patient reports feeling improved.  Patient is able to move all extremities. Unable to comprehend the instructions.  Objective: Vitals:   11/23/21 1215 11/23/21 1230 11/23/21 1245 11/23/21 1400  BP:    (!) 142/75  Pulse: (!) 48 (!) 45 (!) 45 (!) 55  Resp: 13 14 15  (!) 29  Temp:      TempSrc:      SpO2: 100% 99% 94% 96%  Weight:      Height:        Intake/Output Summary (Last 24 hours) at 11/23/2021 1504 Last data filed at 11/23/2021 0324 Gross per 24 hour  Intake 1300 ml  Output --  Net 1300 ml   Filed Weights   11/22/21 1407 11/22/21 1956  Weight: 71.2 kg 71.2 kg    Examination:  General exam: Appears comfortable, elderly male, not in any acute distress. Respiratory system: Clear to auscultation bilaterally, respiratory effort normal, RR 15 Cardiovascular system: S1-S2 heard, regular rate and rhythm, no murmur. Gastrointestinal system: Soft, nontender, nondistended, BS+ Central nervous system: Alert and oriented x 2. No focal neurological deficits.  Unable to comprehend the instructions. Extremities: No edema, no cyanosis, no clubbing Skin: No rashes, lesions or ulcers Psychiatry: Judgement and insight appear normal. Mood & affect appropriate.     Data Reviewed: I have personally reviewed following labs and imaging studies  CBC: Recent Labs  Lab 11/22/21 1455 11/23/21 0342  WBC 5.8 6.8  NEUTROABS 4.1  --   HGB 12.6* 12.6*  HCT 38.5* 38.7*  MCV 87.5 88.2  PLT 193 093   Basic Metabolic Panel: Recent Labs  Lab  11/22/21 1455 11/23/21 0342  NA 137 137  K 3.4* 3.7  CL 107 104  CO2 25 25  GLUCOSE 114* 101*  BUN 17 16  CREATININE 1.03 0.93  CALCIUM 8.3* 8.6*   GFR: Estimated Creatinine Clearance: 51.1 mL/min (by C-G formula based on SCr of 0.93 mg/dL). Liver Function Tests: Recent Labs  Lab 11/22/21 1455  AST 16  ALT 12  ALKPHOS 41  BILITOT 1.0  PROT 6.9  ALBUMIN 3.4*   No results for input(s): LIPASE, AMYLASE in the last 168 hours. No results for input(s): AMMONIA in the last 168 hours. Coagulation Profile: Recent Labs  Lab 11/22/21 1455  INR 1.3*   Cardiac Enzymes: No results for input(s): CKTOTAL, CKMB, CKMBINDEX, TROPONINI in the last 168 hours. BNP (last 3 results) No results for input(s): PROBNP in the last 8760 hours. HbA1C: Recent Labs    11/23/21 0342  HGBA1C 5.7*   CBG: No results for input(s): GLUCAP in the last 168 hours. Lipid Profile: Recent Labs    11/23/21 0342  CHOL 137  HDL 26*  LDLCALC 94  TRIG 85  CHOLHDL 5.3   Thyroid Function Tests: No results for input(s): TSH, T4TOTAL, FREET4, T3FREE, THYROIDAB in the last 72 hours. Anemia Panel: No results for input(s): VITAMINB12, FOLATE, FERRITIN, TIBC, IRON, RETICCTPCT in the last 72 hours. Sepsis Labs: Recent Labs  Lab 11/22/21 1455  LATICACIDVEN 1.1    Recent Results (from the past 240 hour(s))  Blood Culture (routine x 2)     Status: None (Preliminary result)   Collection Time: 11/22/21  2:55 PM   Specimen: BLOOD  Result Value Ref Range Status   Specimen Description   Final    BLOOD BLOOD RIGHT FOREARM Performed at [facility] [address], [place], [place] [address]    Special Requests   Final    BOTTLES DRAWN AEROBIC AND ANAEROBIC Blood Culture adequate volume Performed at [facility] [address] [place], [place] [address]    Culture   Final    NO GROWTH < 24 HOURS Performed at [facility] [address] [place], [place]  [address]    Report Status PENDING  Incomplete  Resp Panel by RT-PCR (Flu A&B, Covid) Nasopharyngeal Swab     Status: None   Collection Time: 11/22/21  3:13 PM   Specimen: Nasopharyngeal Swab; Nasopharyngeal(NP) swabs in vial transport medium  Result Value Ref Range Status   SARS Coronavirus 2 by RT PCR NEGATIVE NEGATIVE Final    Comment: (NOTE) SARS-CoV-2 target nucleic acids are NOT DETECTED.  The SARS-CoV-2 RNA is generally detectable in upper respiratory specimens during the acute phase of infection. The lowest concentration of SARS-CoV-2 viral copies this assay can detect is 138 copies/mL. A negative result does not preclude SARS-Cov-2 infection and should not be used as the sole basis for treatment or other patient management decisions. A negative result may occur with  improper specimen collection/handling, submission of specimen other than nasopharyngeal swab, presence of viral mutation(s) within the areas targeted by this assay, and inadequate number of viral copies(<138 copies/mL). A negative result must be combined with clinical observations, patient history, and epidemiological information. The expected result is Negative.  Fact Sheet for Patients:  EntrepreneurPulse.com.au  Fact Sheet for Healthcare Providers:  IncredibleEmployment.be  This test is no t yet approved or cleared by the [place] FDA and  has been authorized for detection and/or diagnosis of SARS-CoV-2 by FDA under an Emergency Use Authorization (EUA). This EUA will remain  in effect (meaning this test can be used) for the duration of the COVID-19 declaration under Section 564(b)(1) of the Act, 21 U.S.C.section 360bbb-3(b)(1), unless the authorization is terminated  or revoked sooner.       Influenza A by PCR NEGATIVE NEGATIVE Final   Influenza B by PCR NEGATIVE NEGATIVE Final    Comment: (NOTE) The Xpert Xpress SARS-CoV-2/FLU/RSV plus assay is intended as an  aid in the diagnosis of influenza from Nasopharyngeal swab specimens and should not be used as a sole basis for treatment. Nasal washings and aspirates are unacceptable for Xpert Xpress SARS-CoV-2/FLU/RSV testing.  Fact Sheet for Patients: EntrepreneurPulse.com.au  Fact Sheet for Healthcare Providers: IncredibleEmployment.be  This test is not yet approved or cleared by the [place] FDA and has been authorized for detection and/or diagnosis of SARS-CoV-2 by FDA under an Emergency Use Authorization (EUA). This EUA will remain in effect (meaning this test can be used) for the duration of the COVID-19 declaration under Section 564(b)(1) of the Act, 21 U.S.C. section 360bbb-3(b)(1), unless the authorization is terminated or revoked.  Performed at [facility], [place] [address] [place], [place] [address]          Radiology Studies: CT ANGIO HEAD W OR WO CONTRAST  Result Date: 11/23/2021 CLINICAL DATA:  88 year old male with altered mental status and 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 EXAM: CT ANGIOGRAPHY HEAD AND NECK TECHNIQUE: Multidetector CT imaging of the head and neck was performed using the standard protocol during bolus administration of intravenous contrast. Multiplanar CT image reconstructions and MIPs were obtained to evaluate the vascular anatomy. Carotid stenosis measurements (when applicable) are obtained utilizing NASCET criteria, using the distal internal carotid diameter as the denominator. CONTRAST:  9mL OMNIPAQUE IOHEXOL 350 MG/ML SOLN COMPARISON:  Brain MRI and head CT 11/22/2021 FINDINGS: CT HEAD Brain: Stable to mildly increased edema in the right cerebellar hemisphere since the CT yesterday, but no posterior fossa mass effect. No acute intracranial hemorrhage identified. Chronic supratentorial cerebral volume loss and ventriculomegaly. Stable supratentorial gray-white matter  differentiation. Calvarium and skull base: Negative. Paranasal sinuses: Visualized paranasal sinuses and mastoids are stable and well aerated. Orbits: No acute orbit or scalp soft tissue finding. CTA NECK Skeleton: Degenerative ankylosis of multiple cervical spine segments. No acute osseous abnormality identified. Upper chest: Emphysema with Patchy and confluent peripheral and dependent opacity in the upper lungs greater on the right. No superior mediastinal lymphadenopathy. Visible major airways are patent. Visible central pulmonary arteries appear patent. Other neck: Negative. Aortic arch: Tortuous aortic arch with calcified atherosclerosis. Three vessel arch configuration. Right carotid system: Brachiocephalic artery and intermittent right CCA plaque without stenosis. Mild to moderate calcified plaque at the right ICA origin and bulb but less than 50 % stenosis with respect to the distal vessel. Left carotid system: Left CCA origin and intermittent calcified plaque proximal to the bifurcation without stenosis. Mild to moderate calcified plaque at the left ICA origin and bulb with less than 50 % stenosis with respect to the distal vessel. Vertebral arteries: Proximal right subclavian artery calcified atherosclerosis with less than 50 % stenosis with respect to the distal vessel. Bulky calcified plaque involving the right vertebral artery origin with moderate stenosis  on series 12, image 145. The right vertebral artery is otherwise patent and normal to the skull base. Proximal left subclavian artery plaque without significant stenosis. Calcified plaque at the left vertebral artery origin with moderate to severe origin stenosis on series 11, image 267. But patent and otherwise normal left vertebral artery to the skull base. CTA HEAD Posterior circulation: Fairly codominant distal vertebral arteries are patent to the basilar with normal PICA origins. No proximal right PICA occlusion. Both a ICAs appear diminutive.  Mild left V4 calcified plaque. No distal vertebral stenosis. Patent basilar artery without stenosis. Normal SCA and PCA origins. No proximal SCA occlusion. Posterior communicating arteries are diminutive or absent. Bilateral PCA branches are within normal limits. Anterior circulation: Both ICA siphons are patent but heavily calcified. On the left there is mild to moderate stenosis at both the proximal cavernous segment and anterior genu. On the right there is mild to moderate long segment cavernous stenosis. Normal ophthalmic artery origins. Patent carotid termini. Normal MCA and ACA origins. Normal anterior communicating artery but azygous type ACA A2 anatomy. ACA branches are within normal limits. Left MCA M1 segment and bifurcation are patent without stenosis. Middle and posterior MCA branches appear within normal limits, although there is at least moderate stenosis of the anterior proximal M2 branch on series 16, image 27. Right MCA M1 segment is patent with mild calcified plaque and tortuosity but no significant stenosis. Right MCA trifurcation and right MCA branches are within normal limits. Venous sinuses: Early contrast timing, but grossly patent. Anatomic variants: Azygous type ACA A2 anatomy. Review of the MIP images confirms the above findings IMPRESSION: 1. Negative for large vessel occlusion. 2. 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 But no distal vertebral stenosis, and patent cerebellar artery origins. 3. Heavily calcified ICA siphons with mild to moderate bilateral stenosis. And moderate stenosis of the left MCA anterior M2 branch. 4. Expected CT appearance of the right cerebellar infarct. No hemorrhagic transformation or mass effect. 5. Aortic Atherosclerosis (ICD10-I70.0) and Emphysema (ICD10-J43.9). Electronically Signed   By: Genevie Ann M.D.   On: 11/23/2021 11:12   CT Head Wo Contrast  Result Date: 11/22/2021 CLINICAL DATA:  Mental status change,  unknown cause. EXAM: CT HEAD WITHOUT CONTRAST TECHNIQUE: Contiguous axial images were obtained from the base of the skull through the vertex without intravenous contrast. COMPARISON:  10/06/2020 FINDINGS: Brain: New low-density involving the posteromedial aspect of the right cerebellar hemisphere. There may be minimal mass effect in this area. No evidence for acute hemorrhage. Stable cerebral atrophy with dilatation of the ventricles. The ventriculomegaly is likely secondary to atrophy. Again noted is low-density in the periventricular white matter and suggestive for chronic small vessel ischemic changes. Vascular: Atherosclerotic calcifications in the vertebral arteries. Skull: Normal. Negative for fracture or focal lesion. Sinuses/Orbits: No acute finding. Other: None. IMPRESSION: 1. New low-density in the right cerebellar hemisphere. This could represent an acute or subacute infarct. Difficult to exclude an underlying lesion and recommend further characterization with brain MRI. 2. Chronic atrophy and evidence for chronic small vessel ischemic changes. These results were called by telephone at the time of interpretation on 11/22/2021 at 4:06 pm to provider Dr. Francia Greaves, Who verbally acknowledged these results. Electronically Signed   By: Markus Daft M.D.   On: 11/22/2021 16:12   CT ANGIO NECK W OR WO CONTRAST  Result Date: 11/23/2021 CLINICAL DATA:  88 year old male with altered mental status and acute on chronic right cerebellar  infarct on MRI yesterday. EXAM: CT ANGIOGRAPHY HEAD AND NECK TECHNIQUE: Multidetector CT imaging of the head and neck was performed using the standard protocol during bolus administration of intravenous contrast. Multiplanar CT image reconstructions and MIPs were obtained to evaluate the vascular anatomy. Carotid stenosis measurements (when applicable) are obtained utilizing NASCET criteria, using the distal internal carotid diameter as the denominator. CONTRAST:  25mL OMNIPAQUE IOHEXOL  350 MG/ML SOLN COMPARISON:  Brain MRI and head CT 11/22/2021 FINDINGS: CT HEAD Brain: Stable to mildly increased edema in the right cerebellar hemisphere since the CT yesterday, but no posterior fossa mass effect. No acute intracranial hemorrhage identified. Chronic supratentorial cerebral volume loss and ventriculomegaly. Stable supratentorial gray-white matter differentiation. Calvarium and skull base: Negative. Paranasal sinuses: Visualized paranasal sinuses and mastoids are stable and well aerated. Orbits: No acute orbit or scalp soft tissue finding. CTA NECK Skeleton: Degenerative ankylosis of multiple cervical spine segments. No acute osseous abnormality identified. Upper chest: Emphysema with Patchy and confluent peripheral and dependent opacity in the upper lungs greater on the right. No superior mediastinal lymphadenopathy. Visible major airways are patent. Visible central pulmonary arteries appear patent. Other neck: Negative. Aortic arch: Tortuous aortic arch with calcified atherosclerosis. Three vessel arch configuration. Right carotid system: Brachiocephalic artery and intermittent right CCA plaque without stenosis. Mild to moderate calcified plaque at the right ICA origin and bulb but less than 50 % stenosis with respect to the distal vessel. Left carotid system: Left CCA origin and intermittent calcified plaque proximal to the bifurcation without stenosis. Mild to moderate calcified plaque at the left ICA origin and bulb with less than 50 % stenosis with respect to the distal vessel. Vertebral arteries: Proximal right subclavian artery calcified atherosclerosis with less than 50 % stenosis with respect to the distal vessel. Bulky calcified plaque involving the right vertebral artery origin with moderate stenosis on series 12, image 145. The right vertebral artery is otherwise patent and normal to the skull base. Proximal left subclavian artery plaque without significant stenosis. Calcified plaque at  the left vertebral artery origin with moderate to severe origin stenosis on series 11, image 267. But patent and otherwise normal left vertebral artery to the skull base. CTA HEAD Posterior circulation: Fairly codominant distal vertebral arteries are patent to the basilar with normal PICA origins. No proximal right PICA occlusion. Both a ICAs appear diminutive. Mild left V4 calcified plaque. No distal vertebral stenosis. Patent basilar artery without stenosis. Normal SCA and PCA origins. No proximal SCA occlusion. Posterior communicating arteries are diminutive or absent. Bilateral PCA branches are within normal limits. Anterior circulation: Both ICA siphons are patent but heavily calcified. On the left there is mild to moderate stenosis at both the proximal cavernous segment and anterior genu. On the right there is mild to moderate long segment cavernous stenosis. Normal ophthalmic artery origins. Patent carotid termini. Normal MCA and ACA origins. Normal anterior communicating artery but azygous type ACA A2 anatomy. ACA branches are within normal limits. Left MCA M1 segment and bifurcation are patent without stenosis. Middle and posterior MCA branches appear within normal limits, although there is at least moderate stenosis of the anterior proximal M2 branch on series 16, image 27. Right MCA M1 segment is patent with mild calcified plaque and tortuosity but no significant stenosis. Right MCA trifurcation and right MCA branches are within normal limits. Venous sinuses: Early contrast timing, but grossly patent. Anatomic variants: Azygous type ACA A2 anatomy. Review of the MIP images confirms the above findings IMPRESSION: 1.  Negative for large vessel occlusion. 2. 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 But no distal vertebral stenosis, and patent cerebellar artery origins. 3. Heavily calcified ICA siphons with mild to moderate bilateral stenosis. And moderate stenosis  of the left MCA anterior M2 branch. 4. Expected CT appearance of the right cerebellar infarct. No hemorrhagic transformation or mass effect. 5. Aortic Atherosclerosis (ICD10-I70.0) and Emphysema (ICD10-J43.9). Electronically Signed   By: Genevie Ann M.D.   On: 11/23/2021 11:12   MR Brain W and Wo Contrast  Result Date: 11/22/2021 CLINICAL DATA:  Neuro deficit, stroke suspected EXAM: MRI HEAD WITHOUT AND WITH CONTRAST TECHNIQUE: Multiplanar, multiecho pulse sequences of the brain and surrounding structures were obtained without and with intravenous contrast. CONTRAST:  19mL GADAVIST GADOBUTROL 1 MMOL/ML IV SOLN COMPARISON:  12/01/2018, correlation is also made with CT head 11/22/2021 FINDINGS: Brain: Wedge-shaped area of restricted diffusion in the right cerebellum with mildly decreased ADC signal (series 5, images 52-62), likely subacute infarct with some normalization of the ADC. Decreased signal on susceptibility weighted imaging in this area (series 9, image 20), consistent with hemosiderin deposition. An area at the more lateral aspect of this lesion demonstrates more hypointense ADC correlate and is without hemosiderin deposition and may reflect a more acute infarct (series 5, image 558 and series 6, image 10). This area is associated with increased T2 signal, likely edema, which causes mild mass effect on the fourth ventricle which remains patent. Additional punctate foci of mildly increased signal on diffusion-weighted imaging with ADC correlates in the left parietal lobe (series 5, image 85) and vertex right frontal lobe (series 5, image 91), consistent with acute infarcts. Focus of hemosiderin deposition in the left medulla (series 9, image 18), likely sequela of prior hypertensive microhemorrhage. No other acute intraparenchymal hemorrhage, mass, mass effect, or midline shift. No abnormal parenchymal enhancement. Dilatation of the ventricles, which is commensurate with cerebral volume loss and sulcal size.  No extra-axial collection. Along the right frontal convexity, there is an enhancing extradural mass, which measures approximately 1.5 x 0.8 x 0.4 cm (series 16, image 44 and series 17, image 21). Confluent T2 hyperintense signal in the periventricular white matter and pons, likely the sequela of severe chronic small vessel ischemic disease. Vascular: Normal flow voids. Skull and upper cervical spine: Normal marrow signal. Sinuses/Orbits: Status post left lens replacement. Otherwise negative. Other: None. IMPRESSION: 1. Acute on subacute infarct in the right cerebellar hemisphere, with the more subacute portion demonstrating hemosiderin deposition, consistent with hemorrhage. There is mild associated edema, with some mass effect on the fourth ventricle but no evidence of obstruction or hydrocephalus. 2. Additional punctate acute infarcts in the left parietal and right frontal lobe. Given multiple vascular territories, an embolic etiology is suspected. 3. Right frontal convexity meningioma. These results were called by telephone at the time of interpretation on 11/22/2021 at 8:01 pm to provider [facility] , who verbally acknowledged these results. Electronically Signed   By: Merilyn Baba M.D.   On: 11/22/2021 20:02   DG Chest Port 1 View  Result Date: 11/22/2021 CLINICAL DATA:  Will sepsis in a 88 year old male. EXAM: PORTABLE CHEST 1 VIEW COMPARISON:  Comparison is made with October 06, 2020. FINDINGS: EKG leads project over the chest. Trachea is midline. Cardiomediastinal contours with mild cardiac enlargement similar to prior imaging. No lobar consolidation. Chronic changes at the lung bases are similar to previous imaging. Interstitial and airspace opacities at the lung bases as before.  On limited assessment there is no acute skeletal process.  Numerous IMPRESSION: 1. Chronic changes in the lung bases, similar to previous imaging. Question interstitial lung disease, it would be difficult to exclude  superimposed infection. Electronically Signed   By: Zetta Bills M.D.   On: 11/22/2021 14:26   ECHOCARDIOGRAM COMPLETE  Result Date: 11/23/2021    ECHOCARDIOGRAM REPORT   Patient Name:   CARLA WHILDEN Date of Exam: 11/23/2021 Medical Rec #:  [id number]       Height:       68.0 in Accession #:    [id number]      Weight:       157.0 lb Date of Birth:  18-Aug-1929        BSA:          1.844 m Patient Age:    51 years        BP:           136/68 mmHg Patient Gender: M               HR:           46 bpm. Exam Location:  Inpatient Procedure: 2D Echo, Cardiac Doppler and Color Doppler Indications:    Stroke  History:        Patient has prior history of Echocardiogram examinations, most                 recent 10/19/2019. COPD; Risk Factors:Hypertension and ETOH.  Sonographer:    Bernadene Person RDCS Referring Phys: [id number] Cleaster Corin PATEL  Sonographer Comments: Image acquisition challenging due to respiratory motion and Image acquisition challenging due to uncooperative patient. IMPRESSIONS  1. Left ventricular ejection fraction, by estimation, is 60 to 65%. The left ventricle has normal function. The left ventricle has no regional wall motion abnormalities. There is mild concentric left ventricular hypertrophy. Left ventricular diastolic parameters are consistent with Grade I diastolic dysfunction (impaired relaxation).  2. Right ventricular systolic function is normal. The right ventricular size is normal. There is normal pulmonary artery systolic pressure.  3. The mitral valve is normal in structure. Trivial mitral valve regurgitation. No evidence of mitral stenosis.  4. The aortic valve is tricuspid. There is mild calcification of the aortic valve. There is mild thickening of the aortic valve. Aortic valve regurgitation is trivial. No aortic stenosis is present.  5. The inferior vena cava is normal in size with greater than 50% respiratory variability, suggesting right atrial pressure of 3 mmHg. FINDINGS  Left Ventricle:  Left ventricular ejection fraction, by estimation, is 60 to 65%. The left ventricle has normal function. The left ventricle has no regional wall motion abnormalities. The left ventricular internal cavity size was normal in size. There is  mild concentric left ventricular hypertrophy. Left ventricular diastolic parameters are consistent with Grade I diastolic dysfunction (impaired relaxation). Indeterminate filling pressures. Right Ventricle: The right ventricular size is normal. No increase in right ventricular wall thickness. Right ventricular systolic function is normal. There is normal pulmonary artery systolic pressure. The tricuspid regurgitant velocity is 2.85 m/s, and  with an assumed right atrial pressure of 3 mmHg, the estimated right ventricular systolic pressure is 83.3 mmHg. Left Atrium: Left atrial size was normal in size. Right Atrium: Right atrial size was normal in size. Pericardium: There is no evidence of pericardial effusion. Mitral Valve: The mitral valve is normal in structure. Mild mitral annular calcification. Trivial mitral valve regurgitation. No evidence of mitral valve stenosis. Tricuspid Valve: The  tricuspid valve is normal in structure. Tricuspid valve regurgitation is trivial. No evidence of tricuspid stenosis. Aortic Valve: The aortic valve is tricuspid. There is mild calcification of the aortic valve. There is mild thickening of the aortic valve. Aortic valve regurgitation is trivial. No aortic stenosis is present. Aortic valve mean gradient measures 7.8 mmHg. Aortic valve peak gradient measures 13.9 mmHg. Aortic valve area, by VTI measures 1.20 cm. Pulmonic Valve: The pulmonic valve was normal in structure. Pulmonic valve regurgitation is trivial. No evidence of pulmonic stenosis. Aorta: The aortic root is normal in size and structure. Venous: The inferior vena cava is normal in size with greater than 50% respiratory variability, suggesting right atrial pressure of 3 mmHg.  IAS/Shunts: No atrial level shunt detected by color flow Doppler.  LEFT VENTRICLE PLAX 2D LVIDd:         4.50 cm   Diastology LVIDs:         2.40 cm   LV e' medial:    5.50 cm/s LV PW:         1.10 cm   LV E/e' medial:  12.3 LV IVS:        1.30 cm   LV e' lateral:   5.43 cm/s LVOT diam:     2.20 cm   LV E/e' lateral: 12.5 LV SV:         64 LV SV Index:   35 LVOT Area:     3.80 cm  RIGHT VENTRICLE RV S prime:     11.10 cm/s TAPSE (M-mode): 1.7 cm LEFT ATRIUM             Index        RIGHT ATRIUM           Index LA diam:        3.80 cm 2.06 cm/m   RA Area:     16.40 cm LA Vol (A2C):   34.2 ml 18.55 ml/m  RA Volume:   41.60 ml  22.56 ml/m LA Vol (A4C):   49.2 ml 26.68 ml/m LA Biplane Vol: 42.0 ml 22.78 ml/m  AORTIC VALVE                     PULMONIC VALVE AV Area (Vmax):    1.31 cm      PR End Diast Vel: 5.48 msec AV Area (Vmean):   1.16 cm AV Area (VTI):     1.20 cm AV Vmax:           186.50 cm/s AV Vmean:          130.500 cm/s AV VTI:            0.536 m AV Peak Grad:      13.9 mmHg AV Mean Grad:      7.8 mmHg LVOT Vmax:         64.30 cm/s LVOT Vmean:        39.800 cm/s LVOT VTI:          0.169 m LVOT/AV VTI ratio: 0.32 AR Vena Contracta: 0.20 cm  AORTA Ao Root diam: 3.50 cm Ao Asc diam:  3.50 cm MITRAL VALVE               TRICUSPID VALVE MV Area (PHT): 3.12 cm    TR Peak grad:   32.5 mmHg MV Decel Time: 243 msec    TR Vmax:        285.00 cm/s MV E velocity: 67.70 cm/s MV A velocity:  81.80 cm/s  SHUNTS MV E/A ratio:  0.83        Systemic VTI:  0.17 m                            Systemic Diam: 2.20 cm Skeet Latch MD Electronically signed by Skeet Latch MD Signature Date/Time: 11/23/2021/12:57:18 PM    Final     Scheduled Meds:   stroke: mapping our early stages of recovery book   Does not apply Once   divalproex  125 mg Oral QHS   escitalopram  10 mg Oral QHS   fludrocortisone  0.1 mg Oral q AM   LORazepam  0.25 mg Oral BID   sodium chloride (PF)       Continuous Infusions:   LOS: 0 days     Time spent: 50 mins    Shawna Clamp, MD [facility]   If 7PM-7AM, please contact night-coverage

## 2021-11-23 NOTE — Evaluation (Signed)
Speech Language Pathology Evaluation Patient Details Name: Victor Clarke MRN: [id number] DOB: 1929-06-11 Today's Date: 11/23/2021 Time:  -     Problem List:  Patient Active Problem List   Diagnosis Date Noted   Cerebellar stroke, acute ([place]) 11/22/2021   Acute CVA (cerebrovascular accident) ([place]) 11/22/2021   COPD (chronic obstructive pulmonary disease) ([place])    Pneumonia due to COVID-19 virus 10/14/2019   Hyponatremia 10/14/2019   Dementia without behavioral disturbance ([place]) 01/05/2019   Problematic consumption of alcohol 12/01/2018   Tooth impaction 12/01/2018   Ataxia 12/01/2018   First degree AV block    Hypotension due to hypovolemia    Syncope 06/18/2018   Leukopenia 06/18/2018   AV block, [place] II 06/18/2018   Memory loss 10/22/2016   Malnutrition of moderate degree 11/15/2015   Fall from steps 11/07/2015   Anemia 11/07/2015   Alcohol dependence ([place]) 11/07/2015   Pyuria 11/07/2015   Dehydration 08/08/2015   UTI (urinary tract infection) 08/08/2015   High anion gap metabolic acidosis 77/93/9030   Dizziness 07/31/2015   Orthostatic hypotension 07/31/2015   Lactic acidosis 07/31/2015   Ectopic atrial tachycardia ([place])    Hypercholesteremia    Dyspnea on exertion 09/16/2014   Hypertension 09/16/2014   Past Medical History:  Past Medical History:  Diagnosis Date   Alcoholic peripheral neuropathy (HCC)    Allergy    Anemia    Arthritis    Cataract    left eye cataract removed   COPD (chronic obstructive pulmonary disease) ([place])    pt denies 02-03-17   Dehydration    Detached retina    a. s/p surgical correction on the right.   Ectopic atrial tachycardia ([place])    a. 08/2014 Echo: EF 55-60%, mildly dil LA.   EtOH dependence ([place])    Fall    Falls frequently 11/2018   Gout    Gout    Hypercholesteremia    Hypertension    Left inguinal hernia    a. s/p repair ~ 10 yrs ago.   Malnutrition ([place])    Orthostatic hypotension    Parkinson's disease ([place])     Prostate cancer ([place])    a. s/p TURP.   Past Surgical History:  Past Surgical History:  Procedure Laterality Date   CATARACT EXTRACTION     on eye, possibly right eye   COLONOSCOPY     COLONOSCOPY W/ POLYPECTOMY  2001, 2006   HERNIA REPAIR     PROSTATE SURGERY     HPI:  Victor Clarke is a 88 y.o. male with medical history significant for dementia, COPD, HTN, HLD who presented to the ED for evaluation of increased confusion from baseline. Patient currently resides at an assisted living facility.  He was noted to be increasingly confused over the last 24 hours compared to his baseline by facility staff.   Work-up revealed acute or subacute infarct in the right cerebellar hemisphere with hemorrhage as well as punctate acute infarcts in the left parietal and right frontal lobe.   Assessment / Plan / Recommendation Clinical Impression  Pt demosntrates AMS, primarily impacted by lethargy with baseline dementia at the time of eval. Pt is able to participate briefly in basic conversation with a few stuttering type errors. He is able to repeat and name simple objects. He cannot stay alert enough long enough to count to 10 or name three fruits. He will benefit from f/u at SNF level of care for cognitive assesment to determine needs based on his prior  baseline function. Will defer further acute interventions at this time.    SLP Assessment  SLP Recommendation/Assessment: All further Speech Lanaguage Pathology  needs can be addressed in the next venue of care SLP Visit Diagnosis: Cognitive communication deficit (R41.841)    Recommendations for follow up therapy are one component of a multi-disciplinary discharge planning process, led by the attending physician.  Recommendations may be updated based on patient status, additional functional criteria and insurance authorization.    Follow Up Recommendations  Skilled nursing-short term rehab (<3 hours/day)    Assistance Recommended at Discharge      Functional Status Assessment    Frequency and Duration           SLP Evaluation Cognition  Overall Cognitive Status: History of cognitive impairments - at baseline Arousal/Alertness: Lethargic Orientation Level: Oriented to person;Disoriented to place;Disoriented to time;Disoriented to situation Memory: Impaired       Comprehension  Auditory Comprehension Overall Auditory Comprehension: Impaired Yes/No Questions: Not tested Commands: Within Functional Limits Conversation: Simple    Expression Verbal Expression Overall Verbal Expression: Appears within functional limits for tasks assessed Automatic Speech: Counting;Social Response;Name Level of Generative/Spontaneous Verbalization: Phrase Repetition: No impairment Naming: No impairment Written Expression Dominant Hand: Right   Oral / Motor  Oral Motor/Sensory Function Overall Oral Motor/Sensory Function: Mild impairment Facial ROM: Within Functional Limits Facial Symmetry: Within Functional Limits Facial Strength: Within Functional Limits Lingual Symmetry: Within Functional Limits Lingual Strength: Within Functional Limits Lingual Sensation: Within Functional Limits Motor Speech Overall Motor Speech: Impaired Respiration: Within functional limits Phonation: Low vocal intensity            Dontel Harshberger, Katherene Ponto 11/23/2021, 1:48 PM

## 2021-11-23 NOTE — ED Notes (Signed)
Report given to IP nurse and [facility] called.

## 2021-11-23 NOTE — Progress Notes (Signed)
°  Echocardiogram 2D Echocardiogram has been performed.  Fidel Levy 11/23/2021, 12:28 PM

## 2021-11-23 NOTE — Consult Note (Signed)
NEUROLOGY CONSULTATION NOTE   Date of service: November 23, 2021 Patient Name: Victor Clarke MRN:  [id number] DOB:  [birth year] Reason for consult: "R cerebellar strokes" Requesting Provider: Shawna Clamp, MD _ _ _   _ __   _ __ _ _  __ __   _ __   __ _  History of Present Illness  Victor Clarke is a 88 y.o. male with PMH significant for hypertension, hypercholesterolemia, Parkinson's disease, orthostatic hypotension, COPD, anemia, arthritis, history of prostate cancer, history of dementia who presented with increased confusion over 24 hours.  He lives in assisted living facility was sent to ED by staff for further evaluation.  He had work-up with MRI brain with and without contrast which demonstrated acute on subacute infarct in the right cerebellar hemisphere. Also noted to have hemosiderin staining consistent with a prior hemorrhage. Also noted to have additional punctate acute infarcts in the left parietal and R frontal lobe along with a right frontal convexity meningioma.  On my evaluation, he is encephalopathic and disoriented.  He does not know where he is, unaware of the time of the day, unaware of the month and year.  Further work-up with chest x-ray with chronic change in lung bases and questionable interstitial lung disease and difficult to exclude superimposed infection.  COVID-19 is negative, flu is negative.  UA is not particularly concerning for UTI.  Vitals with bradycardia with elevated blood pressure but no fever. He is satting in high 90s on room air.  mRS: unclear. tNKASE/thrombectomy: outside window NIHSS components Score: Comment  1a Level of Conscious 0[x]  1[]  2[]  3[]      1b LOC Questions 0[]  1[x]  2[]       1c LOC Commands 0[x]  1[]  2[]       2 Best Gaze 0[x]  1[]  2[]       3 Visual 0[x]  1[]  2[]  3[]      4 Facial Palsy 0[x]  1[]  2[]  3[]      5a Motor Arm - left 0[x]  1[]  2[]  3[]  4[]  UN[]    5b Motor Arm - Right 0[x]  1[]  2[]  3[]  4[]  UN[]    6a Motor Leg - Left 0[x]  1[]  2[]   3[]  4[]  UN[]    6b Motor Leg - Right 0[x]  1[]  2[]  3[]  4[]  UN[]    7 Limb Ataxia 0[x]  1[]  2[]  3[]  UN[]     8 Sensory 0[x]  1[]  2[]  UN[]      9 Best Language 0[x]  1[]  2[]  3[]      10 Dysarthria 0[x]  1[]  2[]  UN[]      11 Extinct. and Inattention 0[x]  1[]  2[]       TOTAL: 1       ROS   Difficult to obtain a detailed review of system due to encephalopathy.  He denies any pain.  He denies any headache. Past History   Past Medical History:  Diagnosis Date   Alcoholic peripheral neuropathy (HCC)    Allergy    Anemia    Arthritis    Cataract    left eye cataract removed   COPD (chronic obstructive pulmonary disease) ([place])    pt denies 02-03-17   Dehydration    Detached retina    a. s/p surgical correction on the right.   Ectopic atrial tachycardia ([place])    a. 08/2014 Echo: EF 55-60%, mildly dil LA.   EtOH dependence ([place])    Fall    Falls frequently 11/2018   Gout    Gout    Hypercholesteremia    Hypertension    Left inguinal hernia  a. s/p repair ~ 10 yrs ago.   Malnutrition ([place])    Orthostatic hypotension    Parkinson's disease ([place])    Prostate cancer ([place])    a. s/p TURP.   Past Surgical History:  Procedure Laterality Date   CATARACT EXTRACTION     on eye, possibly right eye   COLONOSCOPY     COLONOSCOPY W/ POLYPECTOMY  2001, 2006   HERNIA REPAIR     PROSTATE SURGERY     Family History  Problem Relation Age of Onset   Stroke Father        died @ 90.   Heart attack Mother        died @ 34   Stroke Brother        died in his 40's   Heart attack Brother        died in his 62's   Colon cancer Neg Hx    Esophageal cancer Neg Hx    Rectal cancer Neg Hx    Stomach cancer Neg Hx    Prostate cancer Neg Hx    Social History   Socioeconomic History   Marital status: Widowed    Spouse name: Not on file   Number of children: 0   Years of education: College   Highest education level: Not on file  Occupational History   Occupation: Retired    Fish farm manager: OTHER   Tobacco Use   Smoking status: Former    Packs/day: 2.00    Years: 22.00    Pack years: 44.00    Types: Cigarettes    Quit date: 11/18/1977    Years since quitting: 44.0   Smokeless tobacco: Never  Vaping Use   Vaping Use: Never used  Substance and Sexual Activity   Alcohol use: No    Alcohol/week: 2.0 standard drinks    Types: 1 Shots of liquor, 1 Standard drinks or equivalent per week    Comment: update 01/05/2019 none in past month    Drug use: No   Sexual activity: Never  Other Topics Concern   Not on file  Social History Narrative   Patient lives at an assisted living facility, [facility].  Has no children.     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     Education: college degree.   Caffeine Use: 1 cup occasionally   Social Determinants of Health   Financial Resource Strain: Not on file  Food Insecurity: Not on file  Transportation Needs: Not on file  Physical Activity: Not on file  Stress: Not on file  Social Connections: Not on file   No Known Allergies  Medications   Medications Prior to Admission  Medication Sig Dispense Refill Last Dose   acetaminophen (TYLENOL) 325 MG tablet Take 650 mg by mouth every 4 (four) hours as needed for fever.   unk   albuterol (PROVENTIL) (2.5 MG/3ML) 0.083% nebulizer solution Take 2.5 mg by nebulization every 6 (six) hours as needed for wheezing or shortness of breath.   unk   aspirin EC 81 MG tablet Take 81 mg by mouth every Monday, Wednesday, and Friday.    11/21/2021 at 0800   BIOFREEZE 4 % GEL Apply 1 application topically 3 (three) times daily as needed (to affected sites).   unk   cholecalciferol (VITAMIN D3) 25 MCG (1000 UT) tablet Take 1,000 Units by mouth daily.   11/22/2021 at 0800   Coal Tar 20 % SOLN Apply 1 application topically See admin instructions.  Shampoo the hair when bathing (re: Hospice)   unk   divalproex (DEPAKOTE) 125 MG DR tablet Take 125 mg by mouth at bedtime.   11/21/2021 at 2000    escitalopram (LEXAPRO) 10 MG tablet Take 10 mg by mouth at bedtime.   11/21/2021 at 2000   fludrocortisone (FLORINEF) 0.1 MG tablet Take 0.1 mg by mouth in the morning.   11/22/2021 at 0800   guaiFENesin-dextromethorphan (ROBITUSSIN DM) 100-10 MG/5ML syrup Take 10 mLs by mouth every 4 (four) hours as needed for cough. 118 mL 0 unk   LORazepam (ATIVAN) 0.5 MG tablet Take 0.25 mg by mouth in the morning and at bedtime.   11/22/2021   Magnesium Oxide 250 MG TABS Take 500 mg by mouth at bedtime.   11/21/2021 at 2000   miconazole (MICOTIN) 2 % cream Apply 1 application topically daily as needed (rash, jock itch).   unk   Multiple Vitamin (DAILY-VITE MULTIVITAMIN) TABS Take 1 tablet by mouth in the morning.   11/22/2021 at 0800   OXYGEN Inhale 2 L/min into the lungs as needed (for shortness of breath).   unk   albuterol (VENTOLIN HFA) 108 (90 Base) MCG/ACT inhaler Inhale 2 puffs into the lungs 3 (three) times daily. (Patient not taking: Reported on 11/22/2021) 8 g 0 Not Taking   ALPRAZolam (XANAX) 0.25 MG tablet Take 0.5 tablets (0.125 mg total) by mouth 2 (two) times daily. (Patient not taking: Reported on 11/22/2021) 10 tablet 0 Not Taking     Vitals   Vitals:   11/23/21 1600 11/23/21 1700 11/23/21 1831 11/23/21 2011  BP: (!) 174/85 (!) 170/63 (!) 150/68 (!) 156/114  Pulse: (!) 46 (!) 46 (!) 52 (!) 55  Resp: 17 20 20 18   Temp:   (!) 97.1 F (36.2 C) 98.7 F (37.1 C)  TempSrc:   Axillary Oral  SpO2: 100% 100% 100% 98%  Weight:      Height:         Body mass index is 23.87 kg/m.  Physical Exam   General: Laying comfortably in bed; in no acute distress. HENT: Normal oropharynx and mucosa. Normal external appearance of ears and nose.  Neck: Supple, no pain or tenderness  CV: No JVD. No peripheral edema.  Pulmonary: Symmetric Chest rise. Normal respiratory effort.  Abdomen: Soft to touch, non-tender.  Ext: No cyanosis, edema, hammertoe deformity bilaterally. Skin: No rash. Normal palpation of skin.    Musculoskeletal: Normal digits and nails by inspection. No clubbing.   Neurologic Examination  Mental status/Cognition: Alert, bradyphrenic, oriented to self only, good attention.  Speech/language: Fluent, comprehension intact, object naming intact, repetition intact.  Cranial nerves:   CN II Pupils equal and reactive to light, no VF deficits    CN III,IV,VI EOM intact, no gaze preference or deviation, no nystagmus    CN V normal sensation in V1, V2, and V3 segments bilaterally    CN VII no asymmetry, no nasolabial fold flattening    CN VIII normal hearing to speech    CN IX & X normal palatal elevation, no uvular deviation    CN XI 5/5 head turn and 5/5 shoulder shrug bilaterally    CN XII midline tongue protrusion    Motor:  Muscle bulk: poor, tone normal, pronator drift none tremor none Mvmt Root Nerve  Muscle Right Left Comments  SA C5/6 Ax Deltoid     EF C5/6 Mc Biceps 5 5   EE C6/7/8 Rad Triceps 5 5   WF C6/7 Med  FCR     WE C7/8 PIN ECU     F Ab C8/T1 U ADM/FDI 5 5   HF L1/2/3 Fem Illopsoas 5 5   KE L2/3/4 Fem Quad 5 5   DF L4/5 D Peron Tib Ant 5 5   PF S1/2 Tibial Grc/Sol 5 5    Reflexes:  Right Left Comments  Pectoralis      Biceps (C5/6) 2 2   Brachioradialis (C5/6) 2 2    Triceps (C6/7) 2 2    Patellar (L3/4) 2 2    Achilles (S1)      Hoffman      Plantar mute mute   Jaw jerk    Sensation:  Light touch Intact throughout.   Pin prick    Temperature    Vibration   Proprioception    Coordination/Complex Motor:  - Finger to Nose intact bilaterally. - Heel to shin intact bilaterally. - Rapid alternating movement are slowed throughout. - Gait: Deferred for patient's safety given his encephalopathy. Labs   CBC:  Recent Labs  Lab 11/22/21 1455 11/23/21 0342  WBC 5.8 6.8  NEUTROABS 4.1  --   HGB 12.6* 12.6*  HCT 38.5* 38.7*  MCV 87.5 88.2  PLT 193 009    Basic Metabolic Panel:  Lab Results  Component Value Date   NA 137 11/23/2021   K 3.7  11/23/2021   CO2 25 11/23/2021   GLUCOSE 101 (H) 11/23/2021   BUN 16 11/23/2021   CREATININE 0.93 11/23/2021   CALCIUM 8.6 (L) 11/23/2021   GFRNONAA >60 11/23/2021   GFRAA >60 10/24/2019   Lipid Panel:  Lab Results  Component Value Date   LDLCALC 94 11/23/2021   HgbA1c:  Lab Results  Component Value Date   HGBA1C 5.7 (H) 11/23/2021   Urine Drug Screen:     Component Value Date/Time   LABOPIA NONE DETECTED 06/18/2018 1827   COCAINSCRNUR NONE DETECTED 06/18/2018 1827   LABBENZ NONE DETECTED 06/18/2018 1827   AMPHETMU NONE DETECTED 06/18/2018 1827   THCU NONE DETECTED 06/18/2018 1827   LABBARB NONE DETECTED 06/18/2018 1827    Alcohol Level     Component Value Date/Time   ETH 196 (H) 12/01/2018 1059    CT Head without contrast(Personally reviewed): No right cerebellar hypodensity concerning for an acute/subacute infarct.  CT angio Head and Neck with contrast(Personally reviewed): No LVO.  MRI Brain(Personally reviewed): Acute on subacute infarct in the right cerebellum.  Also has some hemosiderin staining consistent with hemorrhage and mild associated edema with some mass-effect on the fourth ventricle but no obstruction or hydrocephalus. Also has additional punctate infarcts in left parietal and right frontal lobe.  rEEG:  pending  Impression   Victor Clarke is a 88 y.o. male with PMH significant for hypertension, hypercholesterolemia, Parkinson's disease, orthostatic hypotension, COPD, anemia, arthritis, history of prostate cancer, history of dementia who presented with increased confusion over 24 hours.  He lives in assisted living facility was sent to ED by staff for further evaluation.  Unlikely for his confusion to be explained by the cerebellar stroke.  Metabolic/infectious work-up so far negative for UTI, no clear respiratory illness.  Will get standard stroke wokrup for the strokes along with B12, folate, MMA, TSH, routine EEG.  He could just be  delirious at this time dur to hospitalization or this could be progressive worsening of his dementia.  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   Secondary Diagnosis: Essential (primary) hypertension  Recommendations  Plan:   - Frequent Neuro checks per stroke unit protocol - TTE with EF of 60-65% - Recommend obtaining Lipid panel with LDL - Please start statin if LDL > 70 - Recommend HbA1c - Antithrombotic - Aspirin 81mg  daily. - Recommend DVT ppx - SBP goal - permissive hypertension first 24 h < 220/110. Held home meds.  - Recommend Telemetry monitoring for arrythmia - Recommend bedside swallow screen prior to PO intake. - Stroke education booklet - Recommend PT/OT/SLP consult - routine EEG - Vit B12, folate, TSH, Ammonia. - Thiamine 100mg  daily PO or IV.  ______________________________________________________________________  Plan discussed with overnight hospitalist team.  Thank you for the opportunity to take part in the care of this patient. If you have any further questions, please contact the neurology consultation attending.  Signed,  [place] Pager Number [phone] _ _ _   _ __   _ __ _ _  __ __   _ __   __ _

## 2021-11-23 NOTE — Progress Notes (Signed)
[facility] ED - [facility]) Hospitalized patient visit.  This is a current [facility] hospice patient from [facility] with a terminal diagnosis of Sever Protein Calorie Malnutrition.  Patient was transported to ED for evaluation following symptoms of increased confusion from baseline in the past 24 hours. Patient was admitted on 11/23/2021 with a diagnosis of cerebellar stroke. Per Dr. Eulas Post with [facility], this is a related hospital admission.   Visited patient at bedside. Resting/sleeping. No visitors at bedside. Did not respond to my voice. No apparent distress noted. Received report from RN.   VS: 142/75, 62, 20, 100% RA  Abnormal labs:  11/23/21 03:42 Glucose: 101 (H) Calcium: 8.6 (L) Hemoglobin: 12.6 (L) HCT: 38.7 (L) Hemoglobin: 12.6 (L) HCT: 38.7 (L) 11/22/21 14:55 Prothrombin Time: 15.9 (H) INR: 1.3 (H) APTT: 33 Valproic Acid,S: 10 (L) Albumin: 3.4 (L)  Diagnostics: CT head   IMPRESSION: 1. Negative for large vessel occlusion.   2. 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 But no distal vertebral stenosis, and patent cerebellar artery origins.   3. Heavily calcified ICA siphons with mild to moderate bilateral stenosis. And moderate stenosis of the left MCA anterior M2 branch.   4. Expected CT appearance of the right cerebellar infarct. No hemorrhagic transformation or mass effect.  CXR IMPRESSION: 1. Chronic changes in the lung bases, similar to previous imaging. Question interstitial lung disease, it would be difficult to exclude superimposed infection.  Problem list:  Acute /subacute right cerebellar hemisphere infarct with hemorrhage: Acute punctate left parietal and right frontal lobe infarct: Patient was found more confused at assisted facility. MRI confirms acute strokes with hemorrhage. Neurology recommended complete  stroke work-up. Hold aspirin, keep SBP< 180 given finding concerning for  hemorrhage. Frequent neuro checks every 4 hours. Obtain 2D echocardiogram, carotid duplex N.p.o. until speech evaluation. PT and OT evaluation.   Orthostatic hypotension: Continue home Florinef   Hypokalemia: Replaced, continue to monitor   COPD: Stable, continue albuterol as needed   Anxiety and depression: Continue Ativan, Lexapro, Depakote.   Dementia: Continue delirium precautions Patient resides at assisted living and active with hospice  Discharge Planning: Ongoing Family Contact: [name], spoke with by phone.  IDG: Updated GOC: DNR   Please use [facility] for ambulance transport.  Please do not hesitate to call with questions.   Thank you,   Farrel Gordon, RN, [facility] Liaison   [phone]

## 2021-11-23 NOTE — Evaluation (Signed)
Occupational Therapy Evaluation Patient Details Name: Victor Clarke MRN: [id number] DOB: 07/29/1929 Today's Date: 11/23/2021   History of Present Illness Victor Clarke is a 88 y.o. male with medical history significant for dementia, COPD, HTN, HLD who presented to the ED for evaluation of increased confusion from baseline. 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  Mild associated edema with some mass-effect on fourth ventricle noted without evidence of obstruction or hydrocephalus.  Additional punctate acute infarcts in the left parietal and right frontal lobe noted.  Right frontal convexity meningioma also reported. Of note, prior therapy notes report a history of Parkinson's (?)   Clinical Impression   Victor Clarke is a pleasant 88 year old man who presents with above medical history. He is confused and an unreliable historian. He is from an ALF and followed by Hospice. H and supposedly unreliable. He is only oriented to self and reports it is 2022. On evaluation patient presents with chronic bilateral shoulder range of motion deficits but otherwise functional ROM, strength and coordination of upper extremity overall generalized weakness, decreased activity tolerance and impaired balance.  He was mod assist x 2 transfer to side of bed but min assist to stand and take steps. He was min guard to return to supine. He requires max-total assist for LB ADLs and toileting and setup to mod assist for UB ADLs. Patient requires verbal cue for sequencing.  Patient will benefit from skilled OT services while in hospital to improve deficits and maintain functional abilities in order to reduce caregiver burden. Unsure of patient's prior level at facility but expect he needed assistance with ambulation and ADLs. Patient is near his baseline and could return to facility if he has assistance for both mobility and  ADLs. If not - will need to change recommendation to SNF.     Recommendations for follow up therapy are one component of a multi-disciplinary discharge planning process, led by the attending physician.  Recommendations may be updated based on patient status, additional functional criteria and insurance authorization.   Follow Up Recommendations  Long-term institutional care without follow-up therapy (at ALF)    Assistance Recommended at Discharge Frequent or constant Supervision/Assistance  Patient can return home with the following A little help with walking and/or transfers;A lot of help with bathing/dressing/bathroom    Functional Status Assessment  Patient has had a recent decline in their functional status and demonstrates the ability to make significant improvements in function in a reasonable and predictable amount of time.  Equipment Recommendations  None recommended by OT    Recommendations for Other Services       Precautions / Restrictions Precautions Precautions: Fall Restrictions Weight Bearing Restrictions: No      Mobility Bed Mobility Overal bed mobility: Needs Assistance Bed Mobility: Supine to Sit;Sit to Supine     Supine to sit: Mod assist;+2 for physical assistance Sit to supine: Min guard   General bed mobility comments: Mod x 2 to transfer to edge of stretcher. Min guard to return to supine with increased time and effort.    Transfers Overall transfer level: Needs assistance Equipment used: Rolling walker (2 wheels) Transfers: Sit to/from Stand;Bed to chair/wheelchair/BSC Sit to Stand: Min assist;+2 safety/equipment           General transfer comment: Min assist to stand and take steps forward and then to head of bed with user of walker. +2 for safety  Balance Overall balance assessment: Needs assistance Sitting-balance support: No upper extremity supported;Feet supported Sitting balance-Leahy Scale: Fair     Standing balance  support: Bilateral upper extremity supported;Reliant on assistive device for balance Standing balance-Leahy Scale: Poor                             ADL either performed or assessed with clinical judgement   ADL Overall ADL's : Needs assistance/impaired Eating/Feeding: Set up;Sitting   Grooming: Sitting;Wash/dry face;Set up   Upper Body Bathing: Moderate assistance;Cueing for sequencing;Sitting   Lower Body Bathing: Maximal assistance;Sit to/from stand;Cueing for sequencing   Upper Body Dressing : Moderate assistance;Sitting;Cueing for sequencing   Lower Body Dressing: Maximal assistance;Sit to/from stand;Cueing for sequencing   Toilet Transfer: Minimal assistance;+2 for safety/equipment;BSC/3in1;Stand-pivot   Toileting- Clothing Manipulation and Hygiene: Total assistance;Sit to/from stand       Functional mobility during ADLs: Minimal assistance;+2 for safety/equipment;Rolling walker (2 wheels)       Vision   Vision Assessment?: No apparent visual deficits     Perception     Praxis      Pertinent Vitals/Pain Pain Assessment: No/denies pain     Hand Dominance Right   Extremity/Trunk Assessment Upper Extremity Assessment Upper Extremity Assessment: RUE deficits/detail;LUE deficits/detail RUE Deficits / Details: Shoulder ROM approx 60 degrees; shoulder 3-/5, elbow 5/5, wrist, 5/5, grip 4/5 RUE Coordination:  (grossly functional) LUE Deficits / Details: Shoulder ROM approx 60 degrees; shoulder 3-/5, elbow 5/5, wrist 5/5, grip 4/5 LUE Coordination:  (grossly functional)   Lower Extremity Assessment Lower Extremity Assessment: Defer to PT evaluation   Cervical / Trunk Assessment Cervical / Trunk Assessment: Normal   Communication Communication Communication: No difficulties   Cognition Arousal/Alertness: Awake/alert Behavior During Therapy: WFL for tasks assessed/performed Overall Cognitive Status: History of cognitive impairments - at baseline                                  General Comments: Hx of dementia. Patient alert to self - reports it is year 2022. Otherwise PLOF answers are incorrect. He is able to follow all commands. [facility]     General Comments       Exercises     Shoulder Instructions      Home Living Family/patient expects to be discharged to:: Assisted living   Available Help at Discharge: Other (Comment) (assisted living) Type of Home: Assisted living [facility])                                  Prior Functioning/Environment Prior Level of Function : Needs assist             Mobility Comments: unsure - assume use of walker and physical assist. ADLs Comments: unsure - assume assistance required due to dementia        OT Problem List: Decreased strength;Decreased activity tolerance;Impaired balance (sitting and/or standing);Decreased cognition;Decreased knowledge of use of DME or AE;Impaired UE functional use      OT Treatment/Interventions: Self-care/ADL training;Therapeutic exercise;DME and/or AE instruction;Therapeutic activities;Patient/family education;Neuromuscular education;Balance training    OT Goals(Current goals can be found in the care plan section) Acute Rehab OT Goals OT Goal Formulation: Patient unable to participate in goal setting Time For Goal Achievement: 12/07/21 Potential to Achieve Goals: Fair  OT Frequency: Min 2X/week    Co-evaluation  AM-PAC OT "6 Clicks" Daily Activity     Outcome Measure Help from another person eating meals?: A Little Help from another person taking care of personal grooming?: A Little Help from another person toileting, which includes using toliet, bedpan, or urinal?: Total Help from another person bathing (including washing, rinsing, drying)?: A Lot Help from another person to put on and taking off regular upper body clothing?: A Lot Help from another person to put on and taking off regular lower body  clothing?: A Lot 6 Click Score: 13   End of Session Equipment Utilized During Treatment: Rolling walker (2 wheels) Nurse Communication: Mobility status  Activity Tolerance: Patient tolerated treatment well Patient left: in bed;with call bell/phone within reach;with bed alarm set  OT Visit Diagnosis: Unsteadiness on feet (R26.81);Muscle weakness (generalized) (M62.81);Other symptoms and signs involving the nervous system (R29.898)                Time: 3335-4562 OT Time Calculation (min): 15 min Charges:  OT General Charges $OT Visit: 1 Visit OT Evaluation $OT Eval Low Complexity: 1 Low  Kellar Westberg, OTR/L [place]  Office [phone] Pager: [place] 11/23/2021, 12:54 PM

## 2021-11-23 NOTE — ED Notes (Signed)
Spoke with patient's niece [name] to update her.

## 2021-11-24 ENCOUNTER — Inpatient Hospital Stay (HOSPITAL_COMMUNITY)

## 2021-11-24 DIAGNOSIS — F0390 Unspecified dementia without behavioral disturbance: Secondary | ICD-10-CM

## 2021-11-24 DIAGNOSIS — J449 Chronic obstructive pulmonary disease, unspecified: Secondary | ICD-10-CM

## 2021-11-24 DIAGNOSIS — I951 Orthostatic hypotension: Secondary | ICD-10-CM

## 2021-11-24 DIAGNOSIS — I10 Essential (primary) hypertension: Secondary | ICD-10-CM

## 2021-11-24 DIAGNOSIS — I639 Cerebral infarction, unspecified: Secondary | ICD-10-CM

## 2021-11-24 LAB — URINE CULTURE

## 2021-11-24 LAB — FOLATE: Folate: 28.7 ng/mL (ref >5.9)

## 2021-11-24 LAB — AMMONIA: Ammonia: 18 umol/L (ref 9–35)

## 2021-11-24 LAB — TSH: TSH: 2.891 u[IU]/mL (ref 0.350–4.500)

## 2021-11-24 LAB — VITAMIN B12: Vitamin B-12: 644 pg/mL (ref 180–914)

## 2021-11-24 MED ORDER — ASPIRIN EC 81 MG PO TBEC
81.0000 mg | DELAYED_RELEASE_TABLET | ORAL | Status: DC
  Administered 2021-11-26: 81 mg via ORAL
  Filled 2021-11-24: qty 1

## 2021-11-24 MED ORDER — ATORVASTATIN CALCIUM 40 MG PO TABS
40.0000 mg | ORAL_TABLET | Freq: Every day | ORAL | Status: DC
  Administered 2021-11-24 – 2021-11-26 (×3): 40 mg via ORAL
  Filled 2021-11-24 (×3): qty 1

## 2021-11-24 NOTE — Plan of Care (Signed)
Patient remains free from injury/falls throughout shift. Patient bed alarm remains on. This RN spoke with niece [name] to update her that patient still hospitalized. Bed in lowest position, call bell within reach.   Problem: Education: Goal: Knowledge of disease or condition will improve Outcome: Progressing   Problem: Education: Goal: Knowledge of General Education information will improve Description: Including pain rating scale, medication(s)/side effects and non-pharmacologic comfort measures Outcome: Progressing   Problem: Health Behavior/Discharge Planning: Goal: Ability to manage health-related needs will improve Outcome: Progressing   Problem: Pain Managment: Goal: General experience of comfort will improve Outcome: Progressing   Problem: Safety: Goal: Ability to remain free from injury will improve Outcome: Progressing   Problem: Skin Integrity: Goal: Risk for impaired skin integrity will decrease Outcome: Progressing

## 2021-11-24 NOTE — Progress Notes (Signed)
EEG done at bedside. No skin breakdown noted. Results pending. 

## 2021-11-24 NOTE — Progress Notes (Signed)
PROGRESS NOTE  Victor Clarke  JWJ:[id number] DOB: 10-09-1929 DOA: 11/22/2021 PCP: Sherald Hess., MD   Brief Narrative: Victor Clarke is a 88 years old male hospice patient with PMH significant for dementia, COPD, hypertension, hyperlipidemia who presented on 1/5 to the ED from ALF for the evaluation of increased confusion from baseline. Work-up in the ED revealed acute or subacute infarct in the right cerebellar hemisphere with hemorrhagic conversion as well as punctate acute infarct in the left parietal and right frontal lobe. Neurology is following for stroke work up and management. Therapy has recommended SNF level of care at discharge.  Assessment & Plan: Principal Problem:   Cerebellar stroke, acute ([place]) Active Problems:   Hypertension   Orthostatic hypotension   Dementia without behavioral disturbance (HCC)   COPD (chronic obstructive pulmonary disease) (HCC)   Acute CVA (cerebrovascular accident) ([place])  Altered mental status on chronic dementia: Not felt to be attributable to CVA. Primary considerations are acute delirium vs. progression in dementia. Metabolic work up unrevealing, no strong suggestion of infection with normal WBC, afebrile. EEG wnl.  - Delirium precautions.   Acute /subacute right cerebellar hemisphere infarct with hemorrhage: Acute punctate left parietal and right frontal lobe infarct: - Stroke neurology following. Starting low dose aspirin.  - CTA no LVO - PT recommends SNF level of care.  - Statin is indicated with LDL 94. Atorvastatin started, defer to outpatient hospice provider whether this should be continued.   Orthostatic hypotension: - Continue florinef   Hypokalemia: Replaced and resolved.   COPD: Stable. CXR demonstrated chronic changes ?ILD - Continue prn bronchodilator - On 2L O2 prn SOB at baseline.  Anxiety and depression: - Continue ativan, lexapro, depakote.   DNR: POA, followed by hospice as outpatient.   DVT prophylaxis:  SCD Code Status: DNR Family Communication: None at bedside Disposition Plan:  Status is: Inpatient pending completion of stroke work up, clearance from neurology, and safe disposition venue  Consultants:  Neurology  Procedures:  EEG: Normal  Subjective: Pt confused but interactive without complaints. Slept ok. Follows some commands but struggles.   Objective: BP (!) 161/81 (BP Location: Left Arm)    Pulse (!) 55    Temp 97.8 F (36.6 C) (Oral)    Resp 14    Ht 5\' 8"  (1.727 m)    Wt 71.2 kg    SpO2 98%    BMI 23.87 kg/m   Gen: Elderly male in no distress Pulm: Non-labored breathing room air. Clear to auscultation bilaterally.  CV: Regular rate and rhythm. No murmur, rub, or gallop. No JVD, no pitting pedal edema. GI: Abdomen soft, non-tender, non-distended, with normoactive bowel sounds. No organomegaly or masses felt. Ext: Warm, no deformities Skin: No rashes, lesions or ulcers on visualized skin Neuro: Alert. Disoriented. No focal neurological deficits on exam limited by cognition. Psych: Judgement and insight appear impaired by very limited recall.  WBC: normal Cr 1.03 > 0.93 HbA1C: 5.7% LDL: 94, HDL: 26 TSH 2.891 B12 644 Folate 28.7 Urinalysis: negative leukocytes, nitrite, hgb. Urine culture: nonclonal Blood culture 1/5, 1/6: NGTD Covid, flu: Negative 1/5  CT Head Wo Contrast 11/22/2021 1. New low-density in the right cerebellar hemisphere. This could represent an acute or subacute infarct. Difficult to exclude an underlying lesion and recommend further characterization with brain MRI. 2. Chronic atrophy and evidence for chronic small vessel ischemic changes.  MR Brain 11/22/2021 1. Acute on subacute infarct in the right cerebellar hemisphere, with the more subacute portion demonstrating  hemosiderin deposition, consistent with hemorrhage. There is mild associated edema, with some mass effect on the fourth ventricle but no evidence of obstruction or hydrocephalus. 2.  Additional punctate acute infarcts in the left parietal and right frontal lobe. Given multiple vascular territories, an embolic etiology is suspected. 3. Right frontal convexity meningioma.  CT ANGIO HEAD 11/23/2021 1. Negative for large vessel occlusion. 2. 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 But no distal vertebral stenosis, and patent cerebellar artery origins. 3. Heavily calcified ICA siphons with mild to moderate bilateral stenosis. And moderate stenosis of the left MCA anterior M2 branch. 4. Expected CT appearance of the right cerebellar infarct. No hemorrhagic transformation or mass effect.   EEG 11/24/2021 per Dr. Leonel Ramsay:  Interpretation: This normal EEG is recorded in the waking and drowsy state. There was no seizure or seizure predisposition recorded on this study.   ECHOCARDIOGRAM  11/23/2021 1. LVEF 60-65%, no RWMA, mild LVH, G1DD. Normal RV, PA pressure. No significant valvular disease or CES.   Patrecia Pour, MD [facility] [email or web address] 11/24/2021, 3:18 PM

## 2021-11-24 NOTE — Procedures (Signed)
History: 88 year old male being evaluated for confusion  Sedation: None  Technique: This EEG was acquired with electrodes placed according to the International 10-20 electrode system (including Fp1, Fp2, F3, F4, C3, C4, P3, P4, O1, O2, T3, T4, T5, T6, A1, A2, Fz, Cz, Pz). The following electrodes were missing or displaced: none.   Background: The background consists of intermixed alpha and beta activities. There is a well defined posterior dominant rhythm of 8-9 hz that attenuates with eye opening.  With drowsiness there is an increase in slow activity as well as anterior shifting of the posterior dominant rhythm.  Sleep is not recorded.  Photic stimulation: Physiologic driving is not performed  EEG Abnormalities: None  Clinical Interpretation: This normal EEG is recorded in the waking and drowsy state. There was no seizure or seizure predisposition recorded on this study. Please note that lack of epileptiform activity on EEG does not preclude the possibility of epilepsy.   Roland Rack, MD [facility] [phone]  If 7pm- 7am, please page neurology on call as listed in [place].

## 2021-11-24 NOTE — Progress Notes (Addendum)
Victor Clarke [id number] - [facility]) Hospitalized Patient    This is a current [facility] hospice patient from [facility] with a terminal diagnosis of Sever Protein Calorie Malnutrition.  Patient was transported to ED for evaluation following symptoms of increased confusion from baseline in the past 24 hours. Patient was admitted on 11/23/2021 with a diagnosis of cerebellar stroke. Per Dr. Eulas Post with [facility], this is a related hospital admission.    Visited patient at bedside. Resting/sleeping. No visitors at bedside. Did not respond to my voice. Called and updated [name].   Remains inpatient appropriate for treatment of stroke.    VS: 97.8, 161/81, HR 55, RR 14, SPO2 98% on RA I&O: 240/ no output recorded Labs: none collected today Diagnostics: none today IV/PRNs: nothing overnight   Problem list:  Acute /subacute right cerebellar hemisphere infarct with hemorrhage: Acute punctate left parietal and right frontal lobe infarct: Patient was found more confused at assisted facility. MRI confirms acute strokes with hemorrhage. Neurology recommended complete stroke work-up. Hold aspirin, keep SBP< 180 given finding concerning for hemorrhage. Frequent neuro checks every 4 hours. Obtain 2D echocardiogram, carotid duplex N.p.o. until speech evaluation. PT and OT evaluation.   Orthostatic hypotension: Continue home Florinef   Hypokalemia: Replaced, continue to monitor   COPD: Stable, continue albuterol as needed   Anxiety and depression: Continue Ativan, Lexapro, Depakote.   Dementia: Continue delirium precautions Patient resides at assisted living and active with hospice   Discharge Planning: Ongoing, he is from [place], where he was ambulatory at baseline Family Contact: [name], spoke with by phone.  IDG: Updated GOC: Ongoing, current hospice patient but would like to return to his AL if able.    Please use [facility] for ambulance transport.  Venia Carbon RN, BSN,  [place] [facility] Liaison

## 2021-11-24 NOTE — Evaluation (Signed)
Physical Therapy Evaluation Patient Details Name: Victor Clarke MRN: [id number] DOB: 1929-02-16 Today's Date: 11/24/2021  History of Present Illness  Victor Clarke is a 88 y.o. male with medical history significant for dementia, COPD, HTN, HLD who presented to the ED for evaluation of increased confusion from baseline. 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  Mild associated edema with some mass-effect on fourth ventricle noted without evidence of obstruction or hydrocephalus.  Additional punctate acute infarcts in the left parietal and right frontal lobe noted.  Right frontal convexity meningioma also reported. Of note, prior therapy notes report a history of Parkinson's (?)   Clinical Impression  Pt admitted with above diagnosis. PTA pt resided at ALF and active with hospice. Pt currently with functional limitations due to the deficits listed below (see PT Problem List).On eval, pt required mod assist bed mobility, +2 min assist transfers, and +2 min/HHA ambulation 25'. Pt declining OOB to recliner, and, therefore, returned to bed.  Pt will benefit from skilled PT to increase their independence and safety with mobility to allow discharge to the venue listed below.  Recommend SNF at discharge unless ALF able to provide needed level of assist.        Recommendations for follow up therapy are one component of a multi-disciplinary discharge planning process, led by the attending physician.  Recommendations may be updated based on patient status, additional functional criteria and insurance authorization.  Follow Up Recommendations Skilled nursing-short term rehab (<3 hours/day)    Assistance Recommended at Discharge Frequent or constant Supervision/Assistance  Patient can return home with the following  A lot of help with bathing/dressing/bathroom;Assistance with cooking/housework;Direct  supervision/assist for medications management;Assist for transportation;A lot of help with walking and/or transfers    Equipment Recommendations Rolling walker (2 wheels)  Recommendations for Other Services       Functional Status Assessment Patient has had a recent decline in their functional status and demonstrates the ability to make significant improvements in function in a reasonable and predictable amount of time.     Precautions / Restrictions Precautions Precautions: Fall      Mobility  Bed Mobility Overal bed mobility: Needs Assistance Bed Mobility: Supine to Sit;Sit to Supine     Supine to sit: Mod assist;HOB elevated Sit to supine: Min assist;HOB elevated   General bed mobility comments: +rail, increased time, cues for sequencing    Transfers Overall transfer level: Needs assistance Equipment used: 2 person hand held assist Transfers: Sit to/from Stand Sit to Stand: +2 physical assistance;Min assist           General transfer comment: assist to power up and stabilize balance, cues for sequencing    Ambulation/Gait Ambulation/Gait assistance: Min assist;+2 physical assistance Gait Distance (Feet): 25 Feet Assistive device: 2 person hand held assist Gait Pattern/deviations: Step-through pattern;Decreased stride length;Narrow base of support Gait velocity: decreased Gait velocity interpretation: <1.8 ft/sec, indicate of risk for recurrent falls   General Gait Details: unsteady gait with occassional posterior lean  Stairs            Wheelchair Mobility    Modified Rankin (Stroke Patients Only) Modified Rankin (Stroke Patients Only) Pre-Morbid Rankin Score: Moderately severe disability Modified Rankin: Moderately severe disability     Balance Overall balance assessment: Needs assistance Sitting-balance support: No upper extremity supported;Feet supported Sitting balance-Leahy Scale: Fair     Standing balance support: Bilateral upper  extremity supported;During functional  activity Standing balance-Leahy Scale: Poor Standing balance comment: reliant on external support                             Pertinent Vitals/Pain Pain Assessment: No/denies pain    Home Living Family/patient expects to be discharged to:: Assisted living   Available Help at Discharge: Other (Comment) (ALF and hospice) Type of Home: Assisted living [facility])           Home Equipment: Cane - single point;Shower seat - built in      Prior Function Prior Level of Function : Needs assist             Mobility Comments: unsure - assume use of walker and physical assist. When asked "do you use a walker?" pt states "I can but I don't have to." ADLs Comments: unsure - assume assistance required due to dementia     Hand Dominance   Dominant Hand: Right    Extremity/Trunk Assessment   Upper Extremity Assessment Upper Extremity Assessment: Defer to OT evaluation    Lower Extremity Assessment Lower Extremity Assessment: Generalized weakness    Cervical / Trunk Assessment Cervical / Trunk Assessment: Kyphotic  Communication   Communication: No difficulties  Cognition Arousal/Alertness: Awake/alert Behavior During Therapy: WFL for tasks assessed/performed Overall Cognitive Status: History of cognitive impairments - at baseline                                 General Comments: Dementia at baseline. Oriented to self. When asked "do you know where you are?" pt stated "in bed." Following all commands. [facility]        General Comments      Exercises     Assessment/Plan    PT Assessment Patient needs continued PT services  PT Problem List Decreased strength;Decreased mobility;Decreased safety awareness;Decreased knowledge of precautions;Decreased activity tolerance;Decreased balance;Decreased knowledge of use of DME       PT Treatment Interventions DME instruction;Therapeutic activities;Gait  training;Therapeutic exercise;Patient/family education;Balance training;Functional mobility training    PT Goals (Current goals can be found in the Care Plan section)  Acute Rehab PT Goals Patient Stated Goal: unable to state PT Goal Formulation: Patient unable to participate in goal setting Time For Goal Achievement: 12/08/21 Potential to Achieve Goals: Fair    Frequency Min 3X/week     Co-evaluation               AM-PAC PT "6 Clicks" Mobility  Outcome Measure Help needed turning from your back to your side while in a flat bed without using bedrails?: A Little Help needed moving from lying on your back to sitting on the side of a flat bed without using bedrails?: A Lot Help needed moving to and from a bed to a chair (including a wheelchair)?: A Lot Help needed standing up from a chair using your arms (e.g., wheelchair or bedside chair)?: A Little Help needed to walk in hospital room?: A Lot Help needed climbing 3-5 steps with a railing? : Total 6 Click Score: 13    End of Session Equipment Utilized During Treatment: Gait belt Activity Tolerance: Patient limited by fatigue Patient left: in bed;with call bell/phone within reach;with bed alarm set Nurse Communication: Mobility status PT Visit Diagnosis: Unsteadiness on feet (R26.81);Muscle weakness (generalized) (M62.81);Difficulty in walking, not elsewhere classified (R26.2)    Time: 6222-9798 PT Time Calculation (min) (ACUTE ONLY):  12 min   Charges:   PT Evaluation $PT Eval Moderate Complexity: 1 Mod          Lorrin Goodell, [place]  Office # [phone] Pager [phone]   Lorriane Shire 11/24/2021, 9:11 AM

## 2021-11-24 NOTE — Progress Notes (Signed)
STROKE TEAM PROGRESS NOTE   INTERVAL HISTORY No family is at the bedside.  Pt reclining in bed for dinner and just finished dinner. He is awake alert but not orientated but also no focal neuro deficit.    OBJECTIVE Vitals:   11/23/21 2011 11/24/21 0033 11/24/21 0356 11/24/21 0854  BP: (!) 156/114 (!) 156/75 (!) 153/72 (!) 161/81  Pulse: (!) 55 61 (!) 55 (!) 55  Resp: 18 18 20 14   Temp: 98.7 F (37.1 C) 98.4 F (36.9 C) 97.8 F (36.6 C) 97.8 F (36.6 C)  TempSrc: Oral Axillary Axillary Oral  SpO2: 98% 98% 97% 98%  Weight:      Height:        CBC:  Recent Labs  Lab 11/22/21 1455 11/23/21 0342  WBC 5.8 6.8  NEUTROABS 4.1  --   HGB 12.6* 12.6*  HCT 38.5* 38.7*  MCV 87.5 88.2  PLT 193 263    Basic Metabolic Panel:  Recent Labs  Lab 11/22/21 1455 11/23/21 0342  NA 137 137  K 3.4* 3.7  CL 107 104  CO2 25 25  GLUCOSE 114* 101*  BUN 17 16  CREATININE 1.03 0.93  CALCIUM 8.3* 8.6*    Lipid Panel:     Component Value Date/Time   CHOL 137 11/23/2021 0342   CHOL 167 06/30/2018 1021   TRIG 85 11/23/2021 0342   HDL 26 (L) 11/23/2021 0342   HDL 45 06/30/2018 1021   CHOLHDL 5.3 11/23/2021 0342   VLDL 17 11/23/2021 0342   LDLCALC 94 11/23/2021 0342   LDLCALC 105 (H) 06/30/2018 1021   HgbA1c:  Lab Results  Component Value Date   HGBA1C 5.7 (H) 11/23/2021   Urine Drug Screen:     Component Value Date/Time   LABOPIA NONE DETECTED 06/18/2018 1827   COCAINSCRNUR NONE DETECTED 06/18/2018 1827   LABBENZ NONE DETECTED 06/18/2018 1827   AMPHETMU NONE DETECTED 06/18/2018 1827   THCU NONE DETECTED 06/18/2018 1827   LABBARB NONE DETECTED 06/18/2018 1827    Alcohol Level     Component Value Date/Time   ETH 196 (H) 12/01/2018 1059    IMAGING   CT ANGIO HEAD W OR WO CONTRAST  Result Date: 11/23/2021 CLINICAL DATA:  88 year old male with altered mental status and 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 EXAM: CT ANGIOGRAPHY HEAD AND NECK  TECHNIQUE: Multidetector CT imaging of the head and neck was performed using the standard protocol during bolus administration of intravenous contrast. Multiplanar CT image reconstructions and MIPs were obtained to evaluate the vascular anatomy. Carotid stenosis measurements (when applicable) are obtained utilizing NASCET criteria, using the distal internal carotid diameter as the denominator. CONTRAST:  32mL OMNIPAQUE IOHEXOL 350 MG/ML SOLN COMPARISON:  Brain MRI and head CT 11/22/2021 FINDINGS: CT HEAD Brain: Stable to mildly increased edema in the right cerebellar hemisphere since the CT yesterday, but no posterior fossa mass effect. No acute intracranial hemorrhage identified. Chronic supratentorial cerebral volume loss and ventriculomegaly. Stable supratentorial gray-white matter differentiation. Calvarium and skull base: Negative. Paranasal sinuses: Visualized paranasal sinuses and mastoids are stable and well aerated. Orbits: No acute orbit or scalp soft tissue finding. CTA NECK Skeleton: Degenerative ankylosis of multiple cervical spine segments. No acute osseous abnormality identified. Upper chest: Emphysema with Patchy and confluent peripheral and dependent opacity in the upper lungs greater on the right. No superior mediastinal lymphadenopathy. Visible major airways are patent. Visible central pulmonary arteries appear patent. Other neck: Negative. Aortic arch: Tortuous aortic arch with  calcified atherosclerosis. Three vessel arch configuration. Right carotid system: Brachiocephalic artery and intermittent right CCA plaque without stenosis. Mild to moderate calcified plaque at the right ICA origin and bulb but less than 50 % stenosis with respect to the distal vessel. Left carotid system: Left CCA origin and intermittent calcified plaque proximal to the bifurcation without stenosis. Mild to moderate calcified plaque at the left ICA origin and bulb with less than 50 % stenosis with respect to the distal  vessel. Vertebral arteries: Proximal right subclavian artery calcified atherosclerosis with less than 50 % stenosis with respect to the distal vessel. Bulky calcified plaque involving the right vertebral artery origin with moderate stenosis on series 12, image 145. The right vertebral artery is otherwise patent and normal to the skull base. Proximal left subclavian artery plaque without significant stenosis. Calcified plaque at the left vertebral artery origin with moderate to severe origin stenosis on series 11, image 267. But patent and otherwise normal left vertebral artery to the skull base. CTA HEAD Posterior circulation: Fairly codominant distal vertebral arteries are patent to the basilar with normal PICA origins. No proximal right PICA occlusion. Both a ICAs appear diminutive. Mild left V4 calcified plaque. No distal vertebral stenosis. Patent basilar artery without stenosis. Normal SCA and PCA origins. No proximal SCA occlusion. Posterior communicating arteries are diminutive or absent. Bilateral PCA branches are within normal limits. Anterior circulation: Both ICA siphons are patent but heavily calcified. On the left there is mild to moderate stenosis at both the proximal cavernous segment and anterior genu. On the right there is mild to moderate long segment cavernous stenosis. Normal ophthalmic artery origins. Patent carotid termini. Normal MCA and ACA origins. Normal anterior communicating artery but azygous type ACA A2 anatomy. ACA branches are within normal limits. Left MCA M1 segment and bifurcation are patent without stenosis. Middle and posterior MCA branches appear within normal limits, although there is at least moderate stenosis of the anterior proximal M2 branch on series 16, image 27. Right MCA M1 segment is patent with mild calcified plaque and tortuosity but no significant stenosis. Right MCA trifurcation and right MCA branches are within normal limits. Venous sinuses: Early contrast timing,  but grossly patent. Anatomic variants: Azygous type ACA A2 anatomy. Review of the MIP images confirms the above findings IMPRESSION: 1. Negative for large vessel occlusion. 2. 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 But no distal vertebral stenosis, and patent cerebellar artery origins. 3. Heavily calcified ICA siphons with mild to moderate bilateral stenosis. And moderate stenosis of the left MCA anterior M2 branch. 4. Expected CT appearance of the right cerebellar infarct. No hemorrhagic transformation or mass effect. 5. Aortic Atherosclerosis (ICD10-I70.0) and Emphysema (ICD10-J43.9). Electronically Signed   By: Genevie Ann M.D.   On: 11/23/2021 11:12   CT Head Wo Contrast  Result Date: 11/22/2021 CLINICAL DATA:  Mental status change, unknown cause. EXAM: CT HEAD WITHOUT CONTRAST TECHNIQUE: Contiguous axial images were obtained from the base of the skull through the vertex without intravenous contrast. COMPARISON:  10/06/2020 FINDINGS: Brain: New low-density involving the posteromedial aspect of the right cerebellar hemisphere. There may be minimal mass effect in this area. No evidence for acute hemorrhage. Stable cerebral atrophy with dilatation of the ventricles. The ventriculomegaly is likely secondary to atrophy. Again noted is low-density in the periventricular white matter and suggestive for chronic small vessel ischemic changes. Vascular: Atherosclerotic calcifications in the vertebral arteries. Skull: Normal. Negative for fracture or focal lesion.  Sinuses/Orbits: No acute finding. Other: None. IMPRESSION: 1. New low-density in the right cerebellar hemisphere. This could represent an acute or subacute infarct. Difficult to exclude an underlying lesion and recommend further characterization with brain MRI. 2. Chronic atrophy and evidence for chronic small vessel ischemic changes. These results were called by telephone at the time of interpretation on 11/22/2021 at  4:06 pm to provider Dr. Francia Greaves, Who verbally acknowledged these results. Electronically Signed   By: Markus Daft M.D.   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 EXAM: CT ANGIOGRAPHY HEAD AND NECK TECHNIQUE: Multidetector CT imaging of the head and neck was performed using the standard protocol during bolus administration of intravenous contrast. Multiplanar CT image reconstructions and MIPs were obtained to evaluate the vascular anatomy. Carotid stenosis measurements (when applicable) are obtained utilizing NASCET criteria, using the distal internal carotid diameter as the denominator. CONTRAST:  44mL OMNIPAQUE IOHEXOL 350 MG/ML SOLN COMPARISON:  Brain MRI and head CT 11/22/2021 FINDINGS: CT HEAD Brain: Stable to mildly increased edema in the right cerebellar hemisphere since the CT yesterday, but no posterior fossa mass effect. No acute intracranial hemorrhage identified. Chronic supratentorial cerebral volume loss and ventriculomegaly. Stable supratentorial gray-white matter differentiation. Calvarium and skull base: Negative. Paranasal sinuses: Visualized paranasal sinuses and mastoids are stable and well aerated. Orbits: No acute orbit or scalp soft tissue finding. CTA NECK Skeleton: Degenerative ankylosis of multiple cervical spine segments. No acute osseous abnormality identified. Upper chest: Emphysema with Patchy and confluent peripheral and dependent opacity in the upper lungs greater on the right. No superior mediastinal lymphadenopathy. Visible major airways are patent. Visible central pulmonary arteries appear patent. Other neck: Negative. Aortic arch: Tortuous aortic arch with calcified atherosclerosis. Three vessel arch configuration. Right carotid system: Brachiocephalic artery and intermittent right CCA plaque without stenosis. Mild to  moderate calcified plaque at the right ICA origin and bulb but less than 50 % stenosis with respect to the distal vessel. Left carotid system: Left CCA origin and intermittent calcified plaque proximal to the bifurcation without stenosis. Mild to moderate calcified plaque at the left ICA origin and bulb with less than 50 % stenosis with respect to the distal vessel. Vertebral arteries: Proximal right subclavian artery calcified atherosclerosis with less than 50 % stenosis with respect to the distal vessel. Bulky calcified plaque involving the right vertebral artery origin with moderate stenosis on series 12, image 145. The right vertebral artery is otherwise patent and normal to the skull base. Proximal left subclavian artery plaque without significant stenosis. Calcified plaque at the left vertebral artery origin with moderate to severe origin stenosis on series 11, image 267. But patent and otherwise normal left vertebral artery to the skull base. CTA HEAD Posterior circulation: Fairly codominant distal vertebral arteries are patent to the basilar with normal PICA origins. No proximal right PICA occlusion. Both a ICAs appear diminutive. Mild left V4 calcified plaque. No distal vertebral stenosis. Patent basilar artery without stenosis. Normal SCA and PCA origins. No proximal SCA occlusion. Posterior communicating arteries are diminutive or absent. Bilateral PCA branches are within normal limits. Anterior circulation: Both ICA siphons are patent but heavily calcified. On the left there is mild to moderate stenosis at both the proximal cavernous segment and anterior genu. On the right there is mild to moderate long segment cavernous stenosis. Normal ophthalmic artery origins.  Patent carotid termini. Normal MCA and ACA origins. Normal anterior communicating artery but azygous type ACA A2 anatomy. ACA branches are within normal limits. Left MCA M1 segment and bifurcation are patent without stenosis. Middle and  posterior MCA branches appear within normal limits, although there is at least moderate stenosis of the anterior proximal M2 branch on series 16, image 27. Right MCA M1 segment is patent with mild calcified plaque and tortuosity but no significant stenosis. Right MCA trifurcation and right MCA branches are within normal limits. Venous sinuses: Early contrast timing, but grossly patent. Anatomic variants: Azygous type ACA A2 anatomy. Review of the MIP images confirms the above findings IMPRESSION: 1. Negative for large vessel occlusion. 2. 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 But no distal vertebral stenosis, and patent cerebellar artery origins. 3. Heavily calcified ICA siphons with mild to moderate bilateral stenosis. And moderate stenosis of the left MCA anterior M2 branch. 4. Expected CT appearance of the right cerebellar infarct. No hemorrhagic transformation or mass effect. 5. Aortic Atherosclerosis (ICD10-I70.0) and Emphysema (ICD10-J43.9). Electronically Signed   By: Genevie Ann M.D.   On: 11/23/2021 11:12   MR Brain W and Wo Contrast  Result Date: 11/22/2021 CLINICAL DATA:  Neuro deficit, stroke suspected EXAM: MRI HEAD WITHOUT AND WITH CONTRAST TECHNIQUE: Multiplanar, multiecho pulse sequences of the brain and surrounding structures were obtained without and with intravenous contrast. CONTRAST:  35mL GADAVIST GADOBUTROL 1 MMOL/ML IV SOLN COMPARISON:  12/01/2018, correlation is also made with CT head 11/22/2021 FINDINGS: Brain: Wedge-shaped area of restricted diffusion in the right cerebellum with mildly decreased ADC signal (series 5, images 52-62), likely subacute infarct with some normalization of the ADC. Decreased signal on susceptibility weighted imaging in this area (series 9, image 20), consistent with hemosiderin deposition. An area at the more lateral aspect of this lesion demonstrates more hypointense ADC correlate and is without hemosiderin  deposition and may reflect a more acute infarct (series 5, image 558 and series 6, image 10). This area is associated with increased T2 signal, likely edema, which causes mild mass effect on the fourth ventricle which remains patent. Additional punctate foci of mildly increased signal on diffusion-weighted imaging with ADC correlates in the left parietal lobe (series 5, image 85) and vertex right frontal lobe (series 5, image 91), consistent with acute infarcts. Focus of hemosiderin deposition in the left medulla (series 9, image 18), likely sequela of prior hypertensive microhemorrhage. No other acute intraparenchymal hemorrhage, mass, mass effect, or midline shift. No abnormal parenchymal enhancement. Dilatation of the ventricles, which is commensurate with cerebral volume loss and sulcal size. No extra-axial collection. Along the right frontal convexity, there is an enhancing extradural mass, which measures approximately 1.5 x 0.8 x 0.4 cm (series 16, image 44 and series 17, image 21). Confluent T2 hyperintense signal in the periventricular white matter and pons, likely the sequela of severe chronic small vessel ischemic disease. Vascular: Normal flow voids. Skull and upper cervical spine: Normal marrow signal. Sinuses/Orbits: Status post left lens replacement. Otherwise negative. Other: None. IMPRESSION: 1. Acute on subacute infarct in the right cerebellar hemisphere, with the more subacute portion demonstrating hemosiderin deposition, consistent with hemorrhage. There is mild associated edema, with some mass effect on the fourth ventricle but no evidence of obstruction or hydrocephalus. 2. Additional punctate acute infarcts in the left parietal and right frontal lobe. Given multiple vascular territories, an embolic etiology is suspected. 3. Right frontal convexity meningioma. These results were called  by telephone at the time of interpretation on 11/22/2021 at 8:01 pm to provider [facility] , who verbally  acknowledged these results. Electronically Signed   By: Merilyn Baba M.D.   On: 11/22/2021 20:02   DG Chest Port 1 View  Result Date: 11/22/2021 CLINICAL DATA:  Will sepsis in a 88 year old male. EXAM: PORTABLE CHEST 1 VIEW COMPARISON:  Comparison is made with October 06, 2020. FINDINGS: EKG leads project over the chest. Trachea is midline. Cardiomediastinal contours with mild cardiac enlargement similar to prior imaging. No lobar consolidation. Chronic changes at the lung bases are similar to previous imaging. Interstitial and airspace opacities at the lung bases as before. On limited assessment there is no acute skeletal process.  Numerous IMPRESSION: 1. Chronic changes in the lung bases, similar to previous imaging. Question interstitial lung disease, it would be difficult to exclude superimposed infection. Electronically Signed   By: Zetta Bills M.D.   On: 11/22/2021 14:26   ECHOCARDIOGRAM COMPLETE  Result Date: 11/23/2021    ECHOCARDIOGRAM REPORT   Patient Name:   Victor Clarke Date of Exam: 11/23/2021 Medical Rec #:  [id number]       Height:       68.0 in Accession #:    [id number]      Weight:       157.0 lb Date of Birth:  09/04/1929        BSA:          1.844 m Patient Age:    88 years        BP:           136/68 mmHg Patient Gender: M               HR:           46 bpm. Exam Location:  Inpatient Procedure: 2D Echo, Cardiac Doppler and Color Doppler Indications:    Stroke  History:        Patient has prior history of Echocardiogram examinations, most                 recent 10/19/2019. COPD; Risk Factors:Hypertension and ETOH.  Sonographer:    Bernadene Person RDCS Referring Phys: [id number] Cleaster Corin PATEL  Sonographer Comments: Image acquisition challenging due to respiratory motion and Image acquisition challenging due to uncooperative patient. IMPRESSIONS  1. Left ventricular ejection fraction, by estimation, is 60 to 65%. The left ventricle has normal function. The left ventricle has no regional wall  motion abnormalities. There is mild concentric left ventricular hypertrophy. Left ventricular diastolic parameters are consistent with Grade I diastolic dysfunction (impaired relaxation).  2. Right ventricular systolic function is normal. The right ventricular size is normal. There is normal pulmonary artery systolic pressure.  3. The mitral valve is normal in structure. Trivial mitral valve regurgitation. No evidence of mitral stenosis.  4. The aortic valve is tricuspid. There is mild calcification of the aortic valve. There is mild thickening of the aortic valve. Aortic valve regurgitation is trivial. No aortic stenosis is present.  5. The inferior vena cava is normal in size with greater than 50% respiratory variability, suggesting right atrial pressure of 3 mmHg. FINDINGS  Left Ventricle: Left ventricular ejection fraction, by estimation, is 60 to 65%. The left ventricle has normal function. The left ventricle has no regional wall motion abnormalities. The left ventricular internal cavity size was normal in size. There is  mild concentric left ventricular hypertrophy. Left ventricular diastolic parameters are consistent with Grade I  diastolic dysfunction (impaired relaxation). Indeterminate filling pressures. Right Ventricle: The right ventricular size is normal. No increase in right ventricular wall thickness. Right ventricular systolic function is normal. There is normal pulmonary artery systolic pressure. The tricuspid regurgitant velocity is 2.85 m/s, and  with an assumed right atrial pressure of 3 mmHg, the estimated right ventricular systolic pressure is 93.7 mmHg. Left Atrium: Left atrial size was normal in size. Right Atrium: Right atrial size was normal in size. Pericardium: There is no evidence of pericardial effusion. Mitral Valve: The mitral valve is normal in structure. Mild mitral annular calcification. Trivial mitral valve regurgitation. No evidence of mitral valve stenosis. Tricuspid Valve: The  tricuspid valve is normal in structure. Tricuspid valve regurgitation is trivial. No evidence of tricuspid stenosis. Aortic Valve: The aortic valve is tricuspid. There is mild calcification of the aortic valve. There is mild thickening of the aortic valve. Aortic valve regurgitation is trivial. No aortic stenosis is present. Aortic valve mean gradient measures 7.8 mmHg. Aortic valve peak gradient measures 13.9 mmHg. Aortic valve area, by VTI measures 1.20 cm. Pulmonic Valve: The pulmonic valve was normal in structure. Pulmonic valve regurgitation is trivial. No evidence of pulmonic stenosis. Aorta: The aortic root is normal in size and structure. Venous: The inferior vena cava is normal in size with greater than 50% respiratory variability, suggesting right atrial pressure of 3 mmHg. IAS/Shunts: No atrial level shunt detected by color flow Doppler.  LEFT VENTRICLE PLAX 2D LVIDd:         4.50 cm   Diastology LVIDs:         2.40 cm   LV e' medial:    5.50 cm/s LV PW:         1.10 cm   LV E/e' medial:  12.3 LV IVS:        1.30 cm   LV e' lateral:   5.43 cm/s LVOT diam:     2.20 cm   LV E/e' lateral: 12.5 LV SV:         64 LV SV Index:   35 LVOT Area:     3.80 cm  RIGHT VENTRICLE RV S prime:     11.10 cm/s TAPSE (M-mode): 1.7 cm LEFT ATRIUM             Index        RIGHT ATRIUM           Index LA diam:        3.80 cm 2.06 cm/m   RA Area:     16.40 cm LA Vol (A2C):   34.2 ml 18.55 ml/m  RA Volume:   41.60 ml  22.56 ml/m LA Vol (A4C):   49.2 ml 26.68 ml/m LA Biplane Vol: 42.0 ml 22.78 ml/m  AORTIC VALVE                     PULMONIC VALVE AV Area (Vmax):    1.31 cm      PR End Diast Vel: 5.48 msec AV Area (Vmean):   1.16 cm AV Area (VTI):     1.20 cm AV Vmax:           186.50 cm/s AV Vmean:          130.500 cm/s AV VTI:            0.536 m AV Peak Grad:      13.9 mmHg AV Mean Grad:      7.8 mmHg LVOT Vmax:  64.30 cm/s LVOT Vmean:        39.800 cm/s LVOT VTI:          0.169 m LVOT/AV VTI ratio: 0.32 AR Vena  Contracta: 0.20 cm  AORTA Ao Root diam: 3.50 cm Ao Asc diam:  3.50 cm MITRAL VALVE               TRICUSPID VALVE MV Area (PHT): 3.12 cm    TR Peak grad:   32.5 mmHg MV Decel Time: 243 msec    TR Vmax:        285.00 cm/s MV E velocity: 67.70 cm/s MV A velocity: 81.80 cm/s  SHUNTS MV E/A ratio:  0.83        Systemic VTI:  0.17 m                            Systemic Diam: 2.20 cm Skeet Latch MD Electronically signed by Skeet Latch MD Signature Date/Time: 11/23/2021/12:57:18 PM    Final     ECG - SR vs ectopic atrial rhythm - rate 70 BPM. (See cardiology reading for complete details)  EEG - normal EEG is recorded in the waking and drowsy state. There was no seizure or seizure predisposition recorded on this study.  PHYSICAL EXAM  Temp:  [97.8 F (36.6 C)-98.4 F (36.9 C)] 98.4 F (36.9 C) (01/07 2343) Pulse Rate:  [55-59] 55 (01/07 2343) Resp:  [14-21] 20 (01/07 2343) BP: (153-161)/(67-81) 157/67 (01/07 2343) SpO2:  [94 %-98 %] 94 % (01/07 2343)  General - Well nourished, well developed, in no apparent distress.  Ophthalmologic - fundi not visualized due to noncooperation.  Cardiovascular - Regular rhythm and rate.  Neuro - awake, alert, eyes open, orientated to age, and people but not orientated to place, time. No aphasia but paucity of speech, hard of hearing but able to follow all simple commands. Able to name and repeat. No gaze palsy, tracking bilaterally, visual field full, PERRL. No facial droop. Tongue midline. Bilateral UEs 5/5, no drift. Bilaterally LEs 4/5, no drift. Sensation symmetrical bilaterally, b/l FTN intact, gait not tested.    ASSESSMENT/PLAN Victor Clarke is a 88 y.o. male with history hypertension, hypercholesterolemia, Parkinson's disease, orthostatic hypotension, COPD, hx of ETOH abuse, anemia, arthritis, history of prostate cancer, and a history of dementia who presented with increased confusion over 24 hours. MRI c/w subacute infarct. CXR abnormal. He  did not receive IV t-PA due to late presentation (>4.5 hours from time of onset).  Stroke: multiple infarcts in multiple territories with HT - embolic - source unclear CT head - New low-density in the right cerebellar hemisphere. Chronic atrophy and evidence for chronic small vessel ischemic changes. MRI head W & WO - Acute on subacute infarct in the right cerebellar hemisphere, with the more subacute portion demonstrating hemosiderin deposition, consistent with hemorrhage. There is mild associated edema, with some mass effect on the fourth ventricle but no evidence of obstruction or hydrocephalus. Additional punctate acute infarcts in the left parietal and right frontal lobe. Given multiple vascular territories, an embolic etiology is suspected. Right frontal convexity meningioma. CTA H&N - Negative for large vessel occlusion. 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 But no distal vertebral stenosis, and patent cerebellar artery origins. Heavily calcified ICA siphons with mild to moderate bilateral stenosis. And moderate stenosis of the left MCA anterior M2 branch. No hemorrhagic transformation or mass effect. EEG normal  2D Echo - EF 60 - 65%. No cardiac source of emboli identified.  Recommend 30-day cardiac event monitoring to rule out A. fib Lacey Jensen Virus 2 - negative LDL - 94 HgbA1c - 5.7 UDS - not ordered VTE prophylaxis - SCDs Aspirin 81 mg M-W-F  prior to admission, now on aspirin 81 mg daily given hemorrhagic transformation.  Continue on discharge Ongoing aggressive stroke risk factor management Therapy recommendations:  pending Disposition:  Pending, current hospice patient but would like to return to his AL if able  Hypertension Hx of orthostatic hypotension Home BP meds: none  Current BP meds: hydralazine prn - Florinef 0.1 mg Q AM Stable Systolic blood pressure goal < 180 mm Hg  Long-term BP goal  normotensive  Hyperlipidemia Home Lipid lowering medication: none LDL 94, goal < 70 Current lipid lowering medication: Lipitor 40 mg daily  Continue statin at discharge  Other Stroke Risk Factors Advanced age Former cigarette smoker - quit Family hx stroke (father and brother)  Hx of ETOH abuse with neuropathy  Other Active Problems, Findings, Recommendations and/or Plan Code status - DNR  Aortic Atherosclerosis (ICD10-I70.0)  Emphysema (ICD10-J43.9). Dementia  PD   Hospital day # 1  Neurology will sign off. Please call with questions. Pt will follow up with NP at [facility] in about 4 weeks. Thanks for the consult.  Rosalin Hawking, MD PhD Stroke Neurology 11/25/2021 12:51 AM    To contact Stroke Continuity provider, please refer to [email or web address] After hours, contact General Neurology

## 2021-11-24 NOTE — Evaluation (Signed)
Speech Language Pathology Evaluation Patient Details Name: Victor Clarke MRN: [id number] DOB: 08/12/1929 Today's Date: 11/24/2021 Time: 5465-0354 SLP Time Calculation (min) (ACUTE ONLY): 22 min  Problem List:  Patient Active Problem List   Diagnosis Date Noted   Cerebellar stroke, acute ([place]) 11/22/2021   Acute CVA (cerebrovascular accident) ([place]) 11/22/2021   COPD (chronic obstructive pulmonary disease) ([place])    Pneumonia due to COVID-19 virus 10/14/2019   Hyponatremia 10/14/2019   Dementia without behavioral disturbance ([place]) 01/05/2019   Problematic consumption of alcohol 12/01/2018   Tooth impaction 12/01/2018   Ataxia 12/01/2018   First degree AV block    Hypotension due to hypovolemia    Syncope 06/18/2018   Leukopenia 06/18/2018   AV block, Mobitz II 06/18/2018   Memory loss 10/22/2016   Malnutrition of moderate degree 11/15/2015   Fall from steps 11/07/2015   Anemia 11/07/2015   Alcohol dependence ([place]) 11/07/2015   Pyuria 11/07/2015   Dehydration 08/08/2015   UTI (urinary tract infection) 08/08/2015   High anion gap metabolic acidosis 65/68/1275   Dizziness 07/31/2015   Orthostatic hypotension 07/31/2015   Lactic acidosis 07/31/2015   Ectopic atrial tachycardia ([place])    Hypercholesteremia    Dyspnea on exertion 09/16/2014   Hypertension 09/16/2014   Past Medical History:  Past Medical History:  Diagnosis Date   Alcoholic peripheral neuropathy (HCC)    Allergy    Anemia    Arthritis    Cataract    left eye cataract removed   COPD (chronic obstructive pulmonary disease) ([place])    pt denies 02-03-17   Dehydration    Detached retina    a. s/p surgical correction on the right.   Ectopic atrial tachycardia ([place])    a. 08/2014 Echo: EF 55-60%, mildly dil LA.   EtOH dependence ([place])    Fall    Falls frequently 11/2018   Gout    Gout    Hypercholesteremia    Hypertension    Left inguinal hernia    a. s/p repair ~ 10 yrs ago.   Malnutrition ([place])     Orthostatic hypotension    Parkinson's disease ([place])    Prostate cancer ([place])    a. s/p TURP.   Past Surgical History:  Past Surgical History:  Procedure Laterality Date   CATARACT EXTRACTION     on eye, possibly right eye   COLONOSCOPY     COLONOSCOPY W/ POLYPECTOMY  2001, 2006   HERNIA REPAIR     PROSTATE SURGERY     HPI:  88 y.o. male with medical history significant for dementia, COPD, HTN, HLD who presented to the ED for evaluation of increased confusion from baseline.  History is limited from patient due to dementia and is otherwise supplemented by EDP and chart review.    Patient currently resides at an assisted living facility.  He was noted to be increasingly confused over the last 24 hours compared to his baseline by facility staff.  He was subsequently sent to the ED for further evaluation.  Patient currently has no complaints.  Work-up revealed acute or subacute infarct in the right cerebellar hemisphere with hemorrhage as well as punctate acute infarcts in the left parietal and right frontal lobe. Speech/language evaluation generated.  Assessment / Plan / Recommendation Clinical Impression  Pt seen for speech/language cognitive assessment via SLUMS ([place] Mental Status Examination) with a score of 8/26 obtained eliminating the written portion of the assessment (pt's glasses were not available for assessment) with deficits  noted within the areas of attention, memory, organization, orientation, and auditory comprehension.  Pt unable to recall details from short paragraph, perform simple calculation subtraction task, repeat digits past 3 backwards, problem solve simple information without mod cueing provided. Pt's speech was intelligible within simple conversation and he was oriented to name only, but disoriented to place, situation and date. Baseline cognitive status unable to be determined d/t no family present for evaluation.  Pt with significant cognitive impairment  noted with this assessment and likely has regressed from baseline cognitive function as he was a resident at an ALF prior to admission.  ST will f/u for cognitive/linguistic impairment while in acute setting and recommend ST f/u at next venue of care for above deficits.    SLP Assessment  SLP Recommendation/Assessment: Patient needs continued Speech Language Pathology Services SLP Visit Diagnosis: Cognitive communication deficit (R41.841);Attention and concentration deficit Attention and concentration deficit following: Cerebral infarction    Recommendations for follow up therapy are one component of a multi-disciplinary discharge planning process, led by the attending physician.  Recommendations may be updated based on patient status, additional functional criteria and insurance authorization.    Follow Up Recommendations  Follow physician's recommendations for discharge plan and follow up therapies    Assistance Recommended at Discharge  Frequent or constant Supervision/Assistance  Functional Status Assessment Patient has had a recent decline in their functional status and demonstrates the ability to make significant improvements in function in a reasonable and predictable amount of time.  Frequency and Duration min 1 x/week  1 week      SLP Evaluation Cognition  Overall Cognitive Status: No family/caregiver present to determine baseline cognitive functioning Arousal/Alertness: Awake/alert Orientation Level: Oriented to person;Disoriented to situation;Disoriented to time;Disoriented to place Year: Other (Comment) (none stated) Month: February Day of Week: Incorrect Attention: Sustained Sustained Attention: Impaired Sustained Attention Impairment: Verbal basic;Functional basic Memory: Impaired Memory Impairment: Decreased recall of new information;Retrieval deficit;Decreased short term memory Decreased Short Term Memory: Verbal basic;Functional basic Immediate Memory Recall:  Sock;Blue;Bed Memory Recall Sock: Without Cue Memory Recall Blue: Without Cue Memory Recall Bed: Not able to recall Awareness: Impaired Awareness Impairment: Intellectual impairment Problem Solving: Impaired Problem Solving Impairment: Verbal basic;Functional basic Behaviors: Perseveration       Comprehension  Auditory Comprehension Overall Auditory Comprehension: Impaired at baseline Yes/No Questions: Within Functional Limits Commands: Impaired Multistep Basic Commands: 25-49% accurate Conversation: Simple Interfering Components: Attention;Working memory;Processing speed EffectiveTechniques: Public house manager: Not tested Reading Comprehension Reading Status: Not tested (Pt did not have glasses available)    Expression Expression Primary Mode of Expression: Verbal Verbal Expression Overall Verbal Expression: Impaired Level of Generative/Spontaneous Verbalization: Conversation Repetition: Impaired Level of Impairment: Phrase level;Sentence level Naming: Impairment Responsive: 51-75% accurate Confrontation: Impaired Convergent: 50-74% accurate Divergent: 50-74% accurate Verbal Errors: Perseveration Interfering Components: Attention Non-Verbal Means of Communication: Not applicable Written Expression Dominant Hand: Right Written Expression: Not tested (pt did not have glasses available; refused task)   Oral / Motor  Oral Motor/Sensory Function Overall Oral Motor/Sensory Function: Within functional limits Motor Speech Overall Motor Speech: Appears within functional limits for tasks assessed Respiration: Within functional limits Phonation: Normal Resonance: Within functional limits Articulation: Within functional limitis Intelligibility: Intelligible Motor Planning: Witnin functional limits Motor Speech Errors: Not applicable            Elvina Sidle, M.S., CCC-SLP 11/24/2021, 3:44  PM

## 2021-11-25 LAB — SARS CORONAVIRUS 2 (TAT 6-24 HRS): SARS Coronavirus 2: NEGATIVE

## 2021-11-25 NOTE — Progress Notes (Signed)
PROGRESS NOTE  Victor Clarke  QAS:[id number] DOB: 23-Jan-1929 DOA: 11/22/2021 PCP: Sherald Hess., MD   Brief Narrative: Victor Clarke is a 88 years old male hospice patient with PMH significant for dementia, COPD, hypertension, hyperlipidemia who presented on 1/5 to the ED from ALF for the evaluation of increased confusion from baseline. Work-up in the ED revealed acute or subacute infarct in the right cerebellar hemisphere with hemorrhagic conversion as well as punctate acute infarct in the left parietal and right frontal lobe. Neurology is following for stroke work up and management. Therapy has recommended SNF level of care at discharge.  Assessment & Plan: Principal Problem:   Cerebellar stroke, acute ([place]) Active Problems:   Hypertension   Orthostatic hypotension   Dementia without behavioral disturbance (HCC)   COPD (chronic obstructive pulmonary disease) (HCC)   Acute CVA (cerebrovascular accident) ([place])  Altered mental status on chronic dementia: Not felt to be attributable to CVA. Primary considerations are acute delirium vs. progression in dementia. Metabolic work up unrevealing, no strong suggestion of infection with normal WBC, afebrile. EEG wnl.  - Delirium precautions.   Acute /subacute right cerebellar hemisphere infarct with hemorrhage: Acute punctate left parietal and right frontal lobe infarct: CTA no LVO - Stroke neurology final recommendations: ASA 81mg  daily, statin, 30-day cardiac event monitor (high suspicion for embolic etiology in multiple distributions), and neurology f/u at [facility] in about 4 weeks.  - PT recommends SNF level of care.  - Statin is indicated with LDL 94. Atorvastatin started, defer to outpatient hospice provider whether this should be continued.   Orthostatic hypotension: - Continue florinef   Hypokalemia: Replaced and resolved.   COPD: Stable. CXR demonstrated chronic changes ?ILD - Continue prn bronchodilator - On 2L O2 prn SOB at  baseline.  Anxiety and depression: - Continue ativan, lexapro, depakote.   DNR: POA, followed by hospice as outpatient.   DVT prophylaxis: SCD Code Status: DNR Family Communication: None at bedside Disposition Plan:  Status is: Unsafe DC. Pt from ALF and is currently requiring max/total assistance for LB ADLs, moderate assist for bed mobility. May require higher level of care at discharge. We will check to see what [facility] can accommodate.  Consultants:  Neurology  Procedures:  EEG: Normal  Subjective: Pt remains disoriented but conversant. No pain. He reports he feels like people tell him when to go to the bathroom, when to eat, when he can and when he can't shop. Finds this frustrating.   Objective: BP (!) 167/76 (BP Location: Left Arm) Comment: RN Notified   Pulse (!) 58    Temp 98.2 F (36.8 C) (Oral)    Resp 17    Ht 5\' 8"  (1.727 m)    Wt 71.2 kg    SpO2 95%    BMI 23.87 kg/m   Gen: Elderly, pleasantly confused male in no distress Pulm: Nonlabored breathing. Clear. CV: Regular rate and rhythm. No murmur, rub, or gallop. No JVD, no dependent edema. GI: Abdomen soft, non-tender, non-distended, with normoactive bowel sounds.  Ext: Warm, no deformities Skin: No rashes, lesions or ulcers on visualized skin. Neuro: Alert and disoriented, bradyphrenic and finds commands difficult to understand. Psych: Judgement and insight appear impaired. Mood euthymic & affect congruent. Behavior is appropriate.    WBC: normal Cr 1.03 > 0.93 HbA1C: 5.7% LDL: 94, HDL: 26 TSH 2.891 B12 644 Folate 28.7 Urinalysis: negative leukocytes, nitrite, hgb. Urine culture: nonclonal Blood culture 1/5, 1/6: NGTD Covid, flu: Negative 1/5  CT  Head Wo Contrast 11/22/2021 1. New low-density in the right cerebellar hemisphere. This could represent an acute or subacute infarct. Difficult to exclude an underlying lesion and recommend further characterization with brain MRI. 2. Chronic atrophy and  evidence for chronic small vessel ischemic changes.  MR Brain 11/22/2021 1. Acute on subacute infarct in the right cerebellar hemisphere, with the more subacute portion demonstrating hemosiderin deposition, consistent with hemorrhage. There is mild associated edema, with some mass effect on the fourth ventricle but no evidence of obstruction or hydrocephalus. 2. Additional punctate acute infarcts in the left parietal and right frontal lobe. Given multiple vascular territories, an embolic etiology is suspected. 3. Right frontal convexity meningioma.  CT ANGIO HEAD 11/23/2021 1. Negative for large vessel occlusion. 2. 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 But no distal vertebral stenosis, and patent cerebellar artery origins. 3. Heavily calcified ICA siphons with mild to moderate bilateral stenosis. And moderate stenosis of the left MCA anterior M2 branch. 4. Expected CT appearance of the right cerebellar infarct. No hemorrhagic transformation or mass effect.   EEG 11/24/2021 per Dr. Leonel Ramsay:  Interpretation: This normal EEG is recorded in the waking and drowsy state. There was no seizure or seizure predisposition recorded on this study.   ECHOCARDIOGRAM  11/23/2021 1. LVEF 60-65%, no RWMA, mild LVH, G1DD. Normal RV, PA pressure. No significant valvular disease or CES.   Patrecia Pour, MD [facility] [email or web address] 11/25/2021, 11:09 AM

## 2021-11-25 NOTE — Progress Notes (Signed)
Victor Clarke 0O45 - [facility]) Hospitalized Patient    This is a current [facility] hospice patient from [facility] with a terminal diagnosis of Sever Protein Calorie Malnutrition.  Patient was transported to ED for evaluation following symptoms of increased confusion from baseline in the past 24 hours. Patient was admitted on 11/23/2021 with a diagnosis of cerebellar stroke. Per Dr. Eulas Post with [facility], this is a related hospital admission.    Visited patient at bedside, he is alert and oriented to his baseline. He reports feeling well and ready to go back home. It appears that he is near his baseline functionality and hopefully he can return to his AL. Called and updated [name].  Remains inpatient appropriate for treatment of stroke.    VS: 97.7, 142/68, HR 67, RR 18, SPO2 98% on RA I&O: 240/700 Labs: none collected today Diagnostics: none today IV/PRNs: nothing overnight   Problem list:  Acute /subacute right cerebellar hemisphere infarct with hemorrhage: Acute punctate left parietal and right frontal lobe infarct: alert and oriented to his baseline   COPD: Stable, continue albuterol as needed   Anxiety and depression: Continue Ativan, Lexapro, Depakote.   Dementia: Continue delirium precautions Patient resides at assisted living and active with hospice   Discharge Planning: Ongoing, he is from [place], where he was ambulatory at baseline Family Contact: [name], spoke with by phone.  IDG: Updated GOC: Ongoing, current hospice patient but would like to return to his AL if able.    Please use [facility] for ambulance transport.   Venia Carbon RN, BSN, [facility] Liaison

## 2021-11-25 NOTE — TOC Initial Note (Signed)
Transition of Care [facility]) - Initial/Assessment Note    Patient Details  Name: Victor Clarke MRN: [id number] Date of Birth: 12/06/1928  Transition of [facility]) CM/SW Contact:    Coralee Pesa, [place] Phone Number: 11/25/2021, 12:26 PM  Clinical Narrative:                 CSW followed up with [facility] to discuss pt returning. They are agreeable to pt returning, but will need to update his care plan, as he is needing more assistance. Head Nurse Kristeen Miss, will be back tomorrow and TOC will follow up on discharge plan. CSW spoke with Niece [name] and she is agreeable to pt returning to [facility].  CSW requested covid test, medical team updated on plan. TOC will continue to follow.   Expected Discharge Plan: Assisted Living Barriers to Discharge: Continued Medical Work up, Barriers Unresolved (comment) (Facility cannot take over the weekend)   Patient Goals and CMS Choice Patient states their goals for this hospitalization and ongoing recovery are:: Pt cannot participate in goal setting due to disorientation. CMS Medicare.gov Compare Post Acute Care list provided to:: Patient Represenative (must comment) Choice offered to / list presented to :  (Niece, [name])  Expected Discharge Plan and Services Expected Discharge Plan: Assisted Living     Post Acute Care Choice: Resumption of Svcs/PTA Provider Living arrangements for the past 2 months: [place]                                      Prior Living Arrangements/Services Living arrangements for the past 2 months: [place] Lives with:: Facility Resident Patient language and need for interpreter reviewed:: Yes Do you feel safe going back to the place where you live?: Yes      Need for Family Participation in Patient Care: Yes (Comment) Care giver support system in place?: Yes (comment)   Criminal Activity/Legal Involvement Pertinent to Current Situation/Hospitalization: No - Comment as  needed  Activities of Daily Living Home Assistive Devices/Equipment: Walker (specify type), Shower chair with back, Grab bars around toilet, Grab bars in shower, Blood pressure cuff    Permission Sought/Granted Permission sought to share information with : Family Supports Permission granted to share information with : Yes, Verbal Permission Granted  Share Information with NAME: [name]     Permission granted to share info w Relationship: Niece  Permission granted to share info w Contact Information: [phone]  Emotional Assessment Appearance:: Appears stated age Attitude/Demeanor/Rapport: Unable to Assess Affect (typically observed): Unable to Assess Orientation: : Oriented to Self, Oriented to Situation Alcohol / Substance Use: Not Applicable Psych Involvement: No (comment)  Admission diagnosis:  Acute CVA (cerebrovascular accident) [facility]) [I63.9] Cerebrovascular accident (CVA), unspecified mechanism ([place]) [I63.9] Patient Active Problem List   Diagnosis Date Noted   Cerebellar stroke, acute ([place]) 11/22/2021   Acute CVA (cerebrovascular accident) ([place]) 11/22/2021   COPD (chronic obstructive pulmonary disease) ([place])    Pneumonia due to COVID-19 virus 10/14/2019   Hyponatremia 10/14/2019   Dementia without behavioral disturbance ([place]) 01/05/2019   Problematic consumption of alcohol 12/01/2018   Tooth impaction 12/01/2018   Ataxia 12/01/2018   First degree AV block    Hypotension due to hypovolemia    Syncope 06/18/2018   Leukopenia 06/18/2018   AV block, Mobitz II 06/18/2018   Memory loss 10/22/2016   Malnutrition of moderate degree 11/15/2015  Fall from steps 11/07/2015   Anemia 11/07/2015   Alcohol dependence ([place]) 11/07/2015   Pyuria 11/07/2015   Dehydration 08/08/2015   UTI (urinary tract infection) 08/08/2015   High anion gap metabolic acidosis 97/35/3299   Dizziness 07/31/2015   Orthostatic hypotension 07/31/2015   Lactic acidosis 07/31/2015   Ectopic atrial  tachycardia (HCC)    Hypercholesteremia    Dyspnea on exertion 09/16/2014   Hypertension 09/16/2014   PCP:  Sherald Hess., MD Pharmacy:   [facility], [place] [address] [place] [address] Phone: [phone] Fax: [phone]     Social Determinants of Health (SDOH) Interventions    Readmission Risk Interventions No flowsheet data found.

## 2021-11-26 MED ORDER — ASPIRIN EC 81 MG PO TBEC: 81.0000 mg | DELAYED_RELEASE_TABLET | Freq: Every day | ORAL | 0 refills | Status: AC

## 2021-11-26 MED ORDER — ATORVASTATIN CALCIUM 40 MG PO TABS: 40.0000 mg | ORAL_TABLET | Freq: Every day | ORAL | 0 refills | Status: DC

## 2021-11-26 MED ORDER — LORAZEPAM 0.5 MG PO TABS: 0.2500 mg | ORAL_TABLET | Freq: Two times a day (BID) | ORAL | 0 refills | Status: AC

## 2021-11-26 NOTE — TOC Transition Note (Signed)
Transition of Care [facility] - CM/SW Discharge Note   Patient Details  Name: Victor Clarke MRN: [id number] Date of Birth: [birth year]  Transition of Care [facility]) CM/SW Contact:  Geralynn Ochs, LCSW Phone Number: 11/26/2021, 1:49 PM   Clinical Narrative:   Nurse to call report to [phone].    Final next level of care: Memory Care Barriers to Discharge: Barriers Resolved   Patient Goals and CMS Choice Patient states their goals for this hospitalization and ongoing recovery are:: Pt cannot participate in goal setting due to disorientation. CMS Medicare.gov Compare Post Acute Care list provided to:: Patient Represenative (must comment) Choice offered to / list presented to :  (Niece, [name])  Discharge Placement              Patient chooses bed at: [facility] Patient to be transferred to facility by: [facility] Name of family member notified: [name] Patient and family notified of of transfer: 11/26/21  Discharge Plan and Services     Post Acute Care Choice: Resumption of Svcs/PTA Provider                               Social Determinants of Health (SDOH) Interventions     Readmission Risk Interventions No flowsheet data found.

## 2021-11-26 NOTE — Care Management Important Message (Signed)
Important Message  Patient Details  Name: Victor Clarke MRN: [id number] Date of Birth: 12-24-1928   Medicare Important Message Given:  Yes     Destyn Schuyler 11/26/2021, 2:24 PM

## 2021-11-26 NOTE — Discharge Summary (Signed)
Physician Discharge Summary  BAYLEY HURN FUX:[id number] DOB: Apr 05, 1929 DOA: 11/22/2021  PCP: Sherald Hess., MD  Admit date: 11/22/2021 Discharge date: 11/26/2021  Admitted From: [place] Disposition: [place]   Recommendations for Outpatient Follow-up:  Follow up with [facility] hospice Aspirin daily (was MWF) and statin started at discharge. Cardiac event monitoring also recommended, though the patient has been removing telemetry incessantly during hospitalization, so this is unlikely to be practical. Follow up in 4 weeks at [facility] is recommended, at which time this can be discussed further.  Home Health: Per SNF Equipment/Devices: None new, continue 2L O2 prn Discharge Condition: Stable CODE STATUS: DNR Diet recommendation: Heart healthy  Brief/Interim Summary: RODOLFO GASTER is a 88 year old male hospice patient with PMH significant for dementia, COPD, hypertension, hyperlipidemia who presented on 1/5 to the ED from ALF for the evaluation of increased confusion from baseline. Work-up in the ED revealed acute or subacute infarct in the right cerebellar hemisphere with hemorrhagic conversion as well as punctate acute infarct in the left parietal and right frontal lobe. Neurology followed for stroke work up and management, recommending statin initiation, aspirin 81mg  daily, 30-day cardiac event monitoring (high suspicion for embolic etiology with multiple distributions affected), and neurology follow up in 4 weeks. Therapy has recommended SNF level of care at discharge. At his current functional state, he can safely be discharged back to [facility] with an updated care plan, still to be followed by hospice.   Discharge Diagnoses:  Principal Problem:   Cerebellar stroke, acute ([place]) Active Problems:   Hypertension   Orthostatic hypotension   Dementia without behavioral disturbance (HCC)   COPD (chronic obstructive pulmonary  disease) (HCC)   Acute CVA (cerebrovascular accident) ([place])  Altered mental status on chronic dementia: Not felt to be attributable to CVA. Primary considerations are acute delirium vs. progression in dementia. Metabolic work up unrevealing, no strong suggestion of infection with normal WBC, afebrile. EEG wnl.  - Delirium precautions.    Acute /subacute right cerebellar hemisphere infarct with hemorrhage: Acute punctate left parietal and right frontal lobe infarct: CTA no LVO - Stroke neurology final recommendations: ASA 81mg  daily, statin, 30-day cardiac event monitor (high suspicion for embolic etiology in multiple distributions), and neurology f/u at [facility] in about 4 weeks.  - PT recommends SNF level of care.  - Statin is indicated with LDL 94. Atorvastatin started, defer to outpatient hospice provider whether this should be continued.    Orthostatic hypotension: - Continue florinef   Hypokalemia: Replaced and resolved.   COPD: Stable. CXR demonstrated chronic changes ?ILD - Continue prn bronchodilator - On 2L O2 prn SOB at baseline.   Anxiety and depression: - Continue ativan, lexapro, depakote.   DNR: POA, followed by hospice as outpatient.   Discharge Instructions Discharge Instructions     Ambulatory referral to Neurology   Complete by: As directed    Follow up with [place] NP at [facility] in about 4 weeks. Pt is her pt. Thanks.      Allergies as of 11/26/2021   No Known Allergies      Medication List     STOP taking these medications    ALPRAZolam 0.25 MG tablet Commonly known as: XANAX       TAKE these medications    acetaminophen 325 MG tablet Commonly known as: TYLENOL Take 650 mg by mouth every 4 (four) hours as needed for fever.   albuterol (2.5 MG/3ML) 0.083% nebulizer solution Commonly  known as: PROVENTIL Take 2.5 mg by nebulization every 6 (six) hours as needed for wheezing or shortness of breath.   albuterol 108 (90 Base) MCG/ACT inhaler Commonly  known as: VENTOLIN HFA Inhale 2 puffs into the lungs 3 (three) times daily.   aspirin EC 81 MG tablet Take 1 tablet (81 mg total) by mouth daily. What changed: when to take this   atorvastatin 40 MG tablet Commonly known as: LIPITOR Take 1 tablet (40 mg total) by mouth daily.   Biofreeze 4 % Gel Generic drug: Menthol (Topical Analgesic) Apply 1 application topically 3 (three) times daily as needed (to affected sites).   cholecalciferol 25 MCG (1000 UNIT) tablet Commonly known as: VITAMIN D3 Take 1,000 Units by mouth daily.   Coal Tar 20 % Soln Apply 1 application topically See admin instructions. Shampoo the hair when bathing (re: Hospice)   Daily-Vite Multivitamin Tabs Take 1 tablet by mouth in the morning.   divalproex 125 MG DR tablet Commonly known as: DEPAKOTE Take 125 mg by mouth at bedtime.   escitalopram 10 MG tablet Commonly known as: LEXAPRO Take 10 mg by mouth at bedtime.   fludrocortisone 0.1 MG tablet Commonly known as: FLORINEF Take 0.1 mg by mouth in the morning.   guaiFENesin-dextromethorphan 100-10 MG/5ML syrup Commonly known as: ROBITUSSIN DM Take 10 mLs by mouth every 4 (four) hours as needed for cough.   LORazepam 0.5 MG tablet Commonly known as: ATIVAN Take 0.5 tablets (0.25 mg total) by mouth in the morning and at bedtime.   Magnesium Oxide 250 MG Tabs Take 500 mg by mouth at bedtime.   miconazole 2 % cream Commonly known as: MICOTIN Apply 1 application topically daily as needed (rash, jock itch).   OXYGEN Inhale 2 L/min into the lungs as needed (for shortness of breath).        Follow-up Information     Ward Givens, NP. Schedule an appointment as soon as possible for a visit in 1 month(s).   Specialty: Neurology Contact information: [place] [address] [phone]         Sherald Hess., MD Follow up.   Specialty: Family Medicine Contact information: [place]  [address] [phone]         Buford Dresser, MD Follow up.   Specialty: Cardiology Why: if cardiac monitoring is to be pursued Contact information: [address] [place] [address] [phone]                No Known Allergies  Consultations: Neurology  Procedures/Studies: CT ANGIO HEAD W OR WO CONTRAST  Result Date: 11/23/2021 CLINICAL DATA:  88 year old male with altered mental status and 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 EXAM: CT ANGIOGRAPHY HEAD AND NECK TECHNIQUE: Multidetector CT imaging of the head and neck was performed using the standard protocol during bolus administration of intravenous contrast. Multiplanar CT image reconstructions and MIPs were obtained to evaluate the vascular anatomy. Carotid stenosis measurements (when applicable) are obtained utilizing NASCET criteria, using the distal internal carotid diameter as the denominator. CONTRAST:  71mL OMNIPAQUE IOHEXOL 350 MG/ML SOLN COMPARISON:  Brain MRI and head CT 11/22/2021 FINDINGS: CT HEAD Brain: Stable to mildly increased edema in the right cerebellar hemisphere since the CT yesterday, but no posterior fossa mass effect. No acute intracranial hemorrhage identified. Chronic supratentorial cerebral volume loss and ventriculomegaly. Stable supratentorial gray-white matter differentiation. Calvarium and skull base: Negative. Paranasal sinuses: Visualized  paranasal sinuses and mastoids are stable and well aerated. Orbits: No acute orbit or scalp soft tissue finding. CTA NECK Skeleton: Degenerative ankylosis of multiple cervical spine segments. No acute osseous abnormality identified. Upper chest: Emphysema with Patchy and confluent peripheral and dependent opacity in the upper lungs greater on the right. No superior mediastinal lymphadenopathy. Visible major airways are patent. Visible central pulmonary arteries appear patent. Other neck: Negative. Aortic arch: Tortuous  aortic arch with calcified atherosclerosis. Three vessel arch configuration. Right carotid system: Brachiocephalic artery and intermittent right CCA plaque without stenosis. Mild to moderate calcified plaque at the right ICA origin and bulb but less than 50 % stenosis with respect to the distal vessel. Left carotid system: Left CCA origin and intermittent calcified plaque proximal to the bifurcation without stenosis. Mild to moderate calcified plaque at the left ICA origin and bulb with less than 50 % stenosis with respect to the distal vessel. Vertebral arteries: Proximal right subclavian artery calcified atherosclerosis with less than 50 % stenosis with respect to the distal vessel. Bulky calcified plaque involving the right vertebral artery origin with moderate stenosis on series 12, image 145. The right vertebral artery is otherwise patent and normal to the skull base. Proximal left subclavian artery plaque without significant stenosis. Calcified plaque at the left vertebral artery origin with moderate to severe origin stenosis on series 11, image 267. But patent and otherwise normal left vertebral artery to the skull base. CTA HEAD Posterior circulation: Fairly codominant distal vertebral arteries are patent to the basilar with normal PICA origins. No proximal right PICA occlusion. Both a ICAs appear diminutive. Mild left V4 calcified plaque. No distal vertebral stenosis. Patent basilar artery without stenosis. Normal SCA and PCA origins. No proximal SCA occlusion. Posterior communicating arteries are diminutive or absent. Bilateral PCA branches are within normal limits. Anterior circulation: Both ICA siphons are patent but heavily calcified. On the left there is mild to moderate stenosis at both the proximal cavernous segment and anterior genu. On the right there is mild to moderate long segment cavernous stenosis. Normal ophthalmic artery origins. Patent carotid termini. Normal MCA and ACA origins. Normal  anterior communicating artery but azygous type ACA A2 anatomy. ACA branches are within normal limits. Left MCA M1 segment and bifurcation are patent without stenosis. Middle and posterior MCA branches appear within normal limits, although there is at least moderate stenosis of the anterior proximal M2 branch on series 16, image 27. Right MCA M1 segment is patent with mild calcified plaque and tortuosity but no significant stenosis. Right MCA trifurcation and right MCA branches are within normal limits. Venous sinuses: Early contrast timing, but grossly patent. Anatomic variants: Azygous type ACA A2 anatomy. Review of the MIP images confirms the above findings IMPRESSION: 1. Negative for large vessel occlusion. 2. 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 But no distal vertebral stenosis, and patent cerebellar artery origins. 3. Heavily calcified ICA siphons with mild to moderate bilateral stenosis. And moderate stenosis of the left MCA anterior M2 branch. 4. Expected CT appearance of the right cerebellar infarct. No hemorrhagic transformation or mass effect. 5. Aortic Atherosclerosis (ICD10-I70.0) and Emphysema (ICD10-J43.9). Electronically Signed   By: Genevie Ann M.D.   On: 11/23/2021 11:12   CT Head Wo Contrast  Result Date: 11/22/2021 CLINICAL DATA:  Mental status change, unknown cause. EXAM: CT HEAD WITHOUT CONTRAST TECHNIQUE: Contiguous axial images were obtained from the base of the skull through the vertex without intravenous contrast. COMPARISON:  10/06/2020 FINDINGS: Brain: New low-density involving the posteromedial aspect of the right cerebellar hemisphere. There may be minimal mass effect in this area. No evidence for acute hemorrhage. Stable cerebral atrophy with dilatation of the ventricles. The ventriculomegaly is likely secondary to atrophy. Again noted is low-density in the periventricular white matter and suggestive for chronic small vessel ischemic  changes. Vascular: Atherosclerotic calcifications in the vertebral arteries. Skull: Normal. Negative for fracture or focal lesion. Sinuses/Orbits: No acute finding. Other: None. IMPRESSION: 1. New low-density in the right cerebellar hemisphere. This could represent an acute or subacute infarct. Difficult to exclude an underlying lesion and recommend further characterization with brain MRI. 2. Chronic atrophy and evidence for chronic small vessel ischemic changes. These results were called by telephone at the time of interpretation on 11/22/2021 at 4:06 pm to provider Dr. Francia Greaves, Who verbally acknowledged these results. Electronically Signed   By: Markus Daft M.D.   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 EXAM: CT ANGIOGRAPHY HEAD AND NECK TECHNIQUE: Multidetector CT imaging of the head and neck was performed using the standard protocol during bolus administration of intravenous contrast. Multiplanar CT image reconstructions and MIPs were obtained to evaluate the vascular anatomy. Carotid stenosis measurements (when applicable) are obtained utilizing NASCET criteria, using the distal internal carotid diameter as the denominator. CONTRAST:  19mL OMNIPAQUE IOHEXOL 350 MG/ML SOLN COMPARISON:  Brain MRI and head CT 11/22/2021 FINDINGS: CT HEAD Brain: Stable to mildly increased edema in the right cerebellar hemisphere since the CT yesterday, but no posterior fossa mass effect. No acute intracranial hemorrhage identified. Chronic supratentorial cerebral volume loss and ventriculomegaly. Stable supratentorial gray-white matter differentiation. Calvarium and skull base: Negative. Paranasal sinuses: Visualized paranasal sinuses and mastoids are stable and well aerated. Orbits: No acute orbit or scalp soft tissue finding. CTA NECK Skeleton: Degenerative ankylosis of  multiple cervical spine segments. No acute osseous abnormality identified. Upper chest: Emphysema with Patchy and confluent peripheral and dependent opacity in the upper lungs greater on the right. No superior mediastinal lymphadenopathy. Visible major airways are patent. Visible central pulmonary arteries appear patent. Other neck: Negative. Aortic arch: Tortuous aortic arch with calcified atherosclerosis. Three vessel arch configuration. Right carotid system: Brachiocephalic artery and intermittent right CCA plaque without stenosis. Mild to moderate calcified plaque at the right ICA origin and bulb but less than 50 % stenosis with respect to the distal vessel. Left carotid system: Left CCA origin and intermittent calcified plaque proximal to the bifurcation without stenosis. Mild to moderate calcified plaque at the left ICA origin and bulb with less than 50 % stenosis with respect to the distal vessel. Vertebral arteries: Proximal right subclavian artery calcified atherosclerosis with less than 50 % stenosis with respect to the distal vessel. Bulky calcified plaque involving the right vertebral artery origin with moderate stenosis on series 12, image 145. The right vertebral artery is otherwise patent and normal to the skull base. Proximal left subclavian artery plaque without significant stenosis. Calcified plaque at the left vertebral artery origin with moderate to severe origin stenosis on series 11, image 267. But patent and otherwise normal left vertebral artery to the skull base. CTA HEAD Posterior circulation: Fairly codominant distal vertebral arteries are patent to the basilar with normal PICA origins. No proximal right PICA occlusion. Both a ICAs appear diminutive. Mild left V4 calcified plaque. No  distal vertebral stenosis. Patent basilar artery without stenosis. Normal SCA and PCA origins. No proximal SCA occlusion. Posterior communicating arteries are diminutive or absent. Bilateral PCA branches are  within normal limits. Anterior circulation: Both ICA siphons are patent but heavily calcified. On the left there is mild to moderate stenosis at both the proximal cavernous segment and anterior genu. On the right there is mild to moderate long segment cavernous stenosis. Normal ophthalmic artery origins. Patent carotid termini. Normal MCA and ACA origins. Normal anterior communicating artery but azygous type ACA A2 anatomy. ACA branches are within normal limits. Left MCA M1 segment and bifurcation are patent without stenosis. Middle and posterior MCA branches appear within normal limits, although there is at least moderate stenosis of the anterior proximal M2 branch on series 16, image 27. Right MCA M1 segment is patent with mild calcified plaque and tortuosity but no significant stenosis. Right MCA trifurcation and right MCA branches are within normal limits. Venous sinuses: Early contrast timing, but grossly patent. Anatomic variants: Azygous type ACA A2 anatomy. Review of the MIP images confirms the above findings IMPRESSION: 1. Negative for large vessel occlusion. 2. 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 But no distal vertebral stenosis, and patent cerebellar artery origins. 3. Heavily calcified ICA siphons with mild to moderate bilateral stenosis. And moderate stenosis of the left MCA anterior M2 branch. 4. Expected CT appearance of the right cerebellar infarct. No hemorrhagic transformation or mass effect. 5. Aortic Atherosclerosis (ICD10-I70.0) and Emphysema (ICD10-J43.9). Electronically Signed   By: Genevie Ann M.D.   On: 11/23/2021 11:12   MR Brain W and Wo Contrast  Result Date: 11/22/2021 CLINICAL DATA:  Neuro deficit, stroke suspected EXAM: MRI HEAD WITHOUT AND WITH CONTRAST TECHNIQUE: Multiplanar, multiecho pulse sequences of the brain and surrounding structures were obtained without and with intravenous contrast. CONTRAST:  22mL GADAVIST GADOBUTROL 1  MMOL/ML IV SOLN COMPARISON:  12/01/2018, correlation is also made with CT head 11/22/2021 FINDINGS: Brain: Wedge-shaped area of restricted diffusion in the right cerebellum with mildly decreased ADC signal (series 5, images 52-62), likely subacute infarct with some normalization of the ADC. Decreased signal on susceptibility weighted imaging in this area (series 9, image 20), consistent with hemosiderin deposition. An area at the more lateral aspect of this lesion demonstrates more hypointense ADC correlate and is without hemosiderin deposition and may reflect a more acute infarct (series 5, image 558 and series 6, image 10). This area is associated with increased T2 signal, likely edema, which causes mild mass effect on the fourth ventricle which remains patent. Additional punctate foci of mildly increased signal on diffusion-weighted imaging with ADC correlates in the left parietal lobe (series 5, image 85) and vertex right frontal lobe (series 5, image 91), consistent with acute infarcts. Focus of hemosiderin deposition in the left medulla (series 9, image 18), likely sequela of prior hypertensive microhemorrhage. No other acute intraparenchymal hemorrhage, mass, mass effect, or midline shift. No abnormal parenchymal enhancement. Dilatation of the ventricles, which is commensurate with cerebral volume loss and sulcal size. No extra-axial collection. Along the right frontal convexity, there is an enhancing extradural mass, which measures approximately 1.5 x 0.8 x 0.4 cm (series 16, image 44 and series 17, image 21). Confluent T2 hyperintense signal in the periventricular white matter and pons, likely the sequela of severe chronic small vessel ischemic disease. Vascular: Normal flow voids. Skull and upper cervical spine: Normal marrow signal. Sinuses/Orbits: Status post left lens replacement. Otherwise negative.  Other: None. IMPRESSION: 1. Acute on subacute infarct in the right cerebellar hemisphere, with the more  subacute portion demonstrating hemosiderin deposition, consistent with hemorrhage. There is mild associated edema, with some mass effect on the fourth ventricle but no evidence of obstruction or hydrocephalus. 2. Additional punctate acute infarcts in the left parietal and right frontal lobe. Given multiple vascular territories, an embolic etiology is suspected. 3. Right frontal convexity meningioma. These results were called by telephone at the time of interpretation on 11/22/2021 at 8:01 pm to provider [facility] , who verbally acknowledged these results. Electronically Signed   By: Merilyn Baba M.D.   On: 11/22/2021 20:02   DG Chest Port 1 View  Result Date: 11/22/2021 CLINICAL DATA:  Will sepsis in a 88 year old male. EXAM: PORTABLE CHEST 1 VIEW COMPARISON:  Comparison is made with October 06, 2020. FINDINGS: EKG leads project over the chest. Trachea is midline. Cardiomediastinal contours with mild cardiac enlargement similar to prior imaging. No lobar consolidation. Chronic changes at the lung bases are similar to previous imaging. Interstitial and airspace opacities at the lung bases as before. On limited assessment there is no acute skeletal process.  Numerous IMPRESSION: 1. Chronic changes in the lung bases, similar to previous imaging. Question interstitial lung disease, it would be difficult to exclude superimposed infection. Electronically Signed   By: Zetta Bills M.D.   On: 11/22/2021 14:26   EEG adult  Result Date: 11/24/2021 Greta Doom, MD     11/24/2021  1:46 PM History: 88 year old male being evaluated for confusion Sedation: None Technique: This EEG was acquired with electrodes placed according to the International 10-20 electrode system (including Fp1, Fp2, F3, F4, C3, C4, P3, P4, O1, O2, T3, T4, T5, T6, A1, A2, Fz, Cz, Pz). The following electrodes were missing or displaced: none. Background: The background consists of intermixed alpha and beta activities. There is a well  defined posterior dominant rhythm of 8-9 hz that attenuates with eye opening.  With drowsiness there is an increase in slow activity as well as anterior shifting of the posterior dominant rhythm.  Sleep is not recorded. Photic stimulation: Physiologic driving is not performed EEG Abnormalities: None Clinical Interpretation: This normal EEG is recorded in the waking and drowsy state. There was no seizure or seizure predisposition recorded on this study. Please note that lack of epileptiform activity on EEG does not preclude the possibility of epilepsy. Roland Rack, MD [facility] [phone] If 7pm- 7am, please page neurology on call as listed in [place].   ECHOCARDIOGRAM COMPLETE  Result Date: 11/23/2021    ECHOCARDIOGRAM REPORT   Patient Name:   SHAKA CARDIN Date of Exam: 11/23/2021 Medical Rec #:  [id number]       Height:       68.0 in Accession #:    [id number]      Weight:       157.0 lb Date of Birth:  February 02, 1929        BSA:          1.844 m Patient Age:    88 years        BP:           136/68 mmHg Patient Gender: M               HR:           46 bpm. Exam Location:  Inpatient Procedure: 2D Echo, Cardiac Doppler and Color Doppler Indications:    Stroke  History:  Patient has prior history of Echocardiogram examinations, most                 recent 10/19/2019. COPD; Risk Factors:Hypertension and ETOH.  Sonographer:    Bernadene Person RDCS Referring Phys: [id number] Cleaster Corin PATEL  Sonographer Comments: Image acquisition challenging due to respiratory motion and Image acquisition challenging due to uncooperative patient. IMPRESSIONS  1. Left ventricular ejection fraction, by estimation, is 60 to 65%. The left ventricle has normal function. The left ventricle has no regional wall motion abnormalities. There is mild concentric left ventricular hypertrophy. Left ventricular diastolic parameters are consistent with Grade I diastolic dysfunction (impaired relaxation).  2. Right ventricular  systolic function is normal. The right ventricular size is normal. There is normal pulmonary artery systolic pressure.  3. The mitral valve is normal in structure. Trivial mitral valve regurgitation. No evidence of mitral stenosis.  4. The aortic valve is tricuspid. There is mild calcification of the aortic valve. There is mild thickening of the aortic valve. Aortic valve regurgitation is trivial. No aortic stenosis is present.  5. The inferior vena cava is normal in size with greater than 50% respiratory variability, suggesting right atrial pressure of 3 mmHg. FINDINGS  Left Ventricle: Left ventricular ejection fraction, by estimation, is 60 to 65%. The left ventricle has normal function. The left ventricle has no regional wall motion abnormalities. The left ventricular internal cavity size was normal in size. There is  mild concentric left ventricular hypertrophy. Left ventricular diastolic parameters are consistent with Grade I diastolic dysfunction (impaired relaxation). Indeterminate filling pressures. Right Ventricle: The right ventricular size is normal. No increase in right ventricular wall thickness. Right ventricular systolic function is normal. There is normal pulmonary artery systolic pressure. The tricuspid regurgitant velocity is 2.85 m/s, and  with an assumed right atrial pressure of 3 mmHg, the estimated right ventricular systolic pressure is 51.0 mmHg. Left Atrium: Left atrial size was normal in size. Right Atrium: Right atrial size was normal in size. Pericardium: There is no evidence of pericardial effusion. Mitral Valve: The mitral valve is normal in structure. Mild mitral annular calcification. Trivial mitral valve regurgitation. No evidence of mitral valve stenosis. Tricuspid Valve: The tricuspid valve is normal in structure. Tricuspid valve regurgitation is trivial. No evidence of tricuspid stenosis. Aortic Valve: The aortic valve is tricuspid. There is mild calcification of the aortic valve.  There is mild thickening of the aortic valve. Aortic valve regurgitation is trivial. No aortic stenosis is present. Aortic valve mean gradient measures 7.8 mmHg. Aortic valve peak gradient measures 13.9 mmHg. Aortic valve area, by VTI measures 1.20 cm. Pulmonic Valve: The pulmonic valve was normal in structure. Pulmonic valve regurgitation is trivial. No evidence of pulmonic stenosis. Aorta: The aortic root is normal in size and structure. Venous: The inferior vena cava is normal in size with greater than 50% respiratory variability, suggesting right atrial pressure of 3 mmHg. IAS/Shunts: No atrial level shunt detected by color flow Doppler.  LEFT VENTRICLE PLAX 2D LVIDd:         4.50 cm   Diastology LVIDs:         2.40 cm   LV e' medial:    5.50 cm/s LV PW:         1.10 cm   LV E/e' medial:  12.3 LV IVS:        1.30 cm   LV e' lateral:   5.43 cm/s LVOT diam:     2.20 cm   LV  E/e' lateral: 12.5 LV SV:         64 LV SV Index:   35 LVOT Area:     3.80 cm  RIGHT VENTRICLE RV S prime:     11.10 cm/s TAPSE (M-mode): 1.7 cm LEFT ATRIUM             Index        RIGHT ATRIUM           Index LA diam:        3.80 cm 2.06 cm/m   RA Area:     16.40 cm LA Vol (A2C):   34.2 ml 18.55 ml/m  RA Volume:   41.60 ml  22.56 ml/m LA Vol (A4C):   49.2 ml 26.68 ml/m LA Biplane Vol: 42.0 ml 22.78 ml/m  AORTIC VALVE                     PULMONIC VALVE AV Area (Vmax):    1.31 cm      PR End Diast Vel: 5.48 msec AV Area (Vmean):   1.16 cm AV Area (VTI):     1.20 cm AV Vmax:           186.50 cm/s AV Vmean:          130.500 cm/s AV VTI:            0.536 m AV Peak Grad:      13.9 mmHg AV Mean Grad:      7.8 mmHg LVOT Vmax:         64.30 cm/s LVOT Vmean:        39.800 cm/s LVOT VTI:          0.169 m LVOT/AV VTI ratio: 0.32 AR Vena Contracta: 0.20 cm  AORTA Ao Root diam: 3.50 cm Ao Asc diam:  3.50 cm MITRAL VALVE               TRICUSPID VALVE MV Area (PHT): 3.12 cm    TR Peak grad:   32.5 mmHg MV Decel Time: 243 msec    TR Vmax:         285.00 cm/s MV E velocity: 67.70 cm/s MV A velocity: 81.80 cm/s  SHUNTS MV E/A ratio:  0.83        Systemic VTI:  0.17 m                            Systemic Diam: 2.20 cm Skeet Latch MD Electronically signed by Skeet Latch MD Signature Date/Time: 11/23/2021/12:57:18 PM    Final      Subjective: No acute events overnight. Has continued removing cardiac telemetry leads despite frequent redirection. Has no complaints this morning except that he would like to go home.   Discharge Exam: Vitals:   11/26/21 0033 11/26/21 0350  BP: (!) 155/100 (!) 150/88  Pulse: (!) 56 61  Resp: 20 20  Temp: (!) 97.5 F (36.4 C) 98 F (36.7 C)  SpO2: 100% 96%   General: Pt is alert, awake, not in acute distress Cardiovascular: Regular borderline bradycardia (no AFib noted on recorded sections of telemetry), S1/S2 +, no rubs, no gallops Respiratory: CTA bilaterally, no wheezing, no rhonchi Abdominal: Soft, NT, ND, bowel sounds + Extremities: No edema, no cyanosis  Labs: Basic Metabolic Panel: Recent Labs  Lab 11/22/21 1455 11/23/21 0342  NA 137 137  K 3.4* 3.7  CL 107 104  CO2 25 25  GLUCOSE 114*  101*  BUN 17 16  CREATININE 1.03 0.93  CALCIUM 8.3* 8.6*   Liver Function Tests: Recent Labs  Lab 11/22/21 1455  AST 16  ALT 12  ALKPHOS 41  BILITOT 1.0  PROT 6.9  ALBUMIN 3.4*   Recent Labs  Lab 11/24/21 0158  AMMONIA 18   CBC: Recent Labs  Lab 11/22/21 1455 11/23/21 0342  WBC 5.8 6.8  NEUTROABS 4.1  --   HGB 12.6* 12.6*  HCT 38.5* 38.7*  MCV 87.5 88.2  PLT 193 192   Thyroid function studies Recent Labs    11/24/21 0158  TSH 2.891   Anemia work up Recent Labs    11/24/21 0158  VITAMINB12 644  FOLATE 28.7   Urinalysis    Component Value Date/Time   COLORURINE YELLOW 11/22/2021 2132   APPEARANCEUR CLEAR 11/22/2021 2132   LABSPEC 1.024 11/22/2021 2132   PHURINE 7.0 11/22/2021 2132   GLUCOSEU NEGATIVE 11/22/2021 2132   HGBUR NEGATIVE 11/22/2021 2132    BILIRUBINUR NEGATIVE 11/22/2021 2132   KETONESUR 5 (A) 11/22/2021 2132   PROTEINUR 30 (A) 11/22/2021 2132   UROBILINOGEN 1.0 08/05/2015 1155   NITRITE NEGATIVE 11/22/2021 2132   LEUKOCYTESUR NEGATIVE 11/22/2021 2132    Microbiology Recent Results (from the past 240 hour(s))  Blood Culture (routine x 2)     Status: None (Preliminary result)   Collection Time: 11/22/21  2:55 PM   Specimen: BLOOD  Result Value Ref Range Status   Specimen Description   Final    BLOOD BLOOD RIGHT FOREARM Performed at [facility], [place] [address], [place], [place] [address]    Special Requests   Final    BOTTLES DRAWN AEROBIC AND ANAEROBIC Blood Culture adequate volume Performed at [place] [address], [place], [place] [address]    Culture   Final    NO GROWTH 3 DAYS Performed at [facility], [place] [address], [place], [place] [address]    Report Status PENDING  Incomplete  Resp Panel by RT-PCR (Flu A&B, Covid) Nasopharyngeal Swab     Status: None   Collection Time: 11/22/21  3:13 PM   Specimen: Nasopharyngeal Swab; Nasopharyngeal(NP) swabs in vial transport medium  Result Value Ref Range Status   SARS Coronavirus 2 by RT PCR NEGATIVE NEGATIVE Final   Influenza A by PCR NEGATIVE NEGATIVE Final   Influenza B by PCR NEGATIVE NEGATIVE Final  Urine Culture     Status: Abnormal   Collection Time: 11/22/21  9:32 PM   Specimen: In/Out Cath Urine  Result Value Ref Range Status   Specimen Description   Final    IN/OUT CATH URINE Performed at [facility], [place] [address], [place], [place] [address]    Special Requests   Final    NONE Performed at [facility], [place] [address], [place], [place] [address]    Culture MULTIPLE SPECIES PRESENT, SUGGEST RECOLLECTION (A)  Final   Report Status 11/24/2021 FINAL  Final  Blood Culture (routine x 2)     Status: None (Preliminary result)   Collection Time: 11/23/21  3:42 AM    Specimen: BLOOD LEFT HAND  Result Value Ref Range Status   Specimen Description   Final    BLOOD LEFT HAND Performed at [place] [address], [place], [place] [address]    Special Requests   Final    BOTTLES DRAWN AEROBIC AND ANAEROBIC Blood Culture adequate volume Performed at [facility], [place] [address], [place], [place] [address]    Culture   Final    NO GROWTH 2 DAYS Performed at [place] [address], [place], [place] [address]    Report Status PENDING  Incomplete  SARS CORONAVIRUS 2 (TAT 6-24 HRS) Nasopharyngeal Nasopharyngeal Swab     Status: None   Collection Time: 11/25/21 12:22 PM   Specimen: Nasopharyngeal Swab  Result Value Ref Range Status   SARS Coronavirus 2 NEGATIVE NEGATIVE Final    Time coordinating discharge: Approximately 40 minutes  Patrecia Pour, MD  [facility] 11/26/2021, 7:37 AM

## 2021-11-26 NOTE — NC FL2 (Signed)
[place] LEVEL OF CARE SCREENING TOOL     IDENTIFICATION  Patient Name: Victor Clarke Birthdate: [birth year] Sex: male Admission Date (Current Location): 11/22/2021  [facility] and [place] Number:  Herbalist and Address:  [facility], [place] [address] [place], [place] [address]      Provider Number: [id number]  Attending Physician Name and Address:  Patrecia Pour, MD  Relative Name and Phone Number:  Fredric Mare, [phone]    Current Level of Care: Hospital Recommended Level of Care: Memory Care Prior Approval Number:    Date Approved/Denied:   PASRR Number:    Discharge Plan: Other (Comment) (Memory care)    Current Diagnoses: Patient Active Problem List   Diagnosis Date Noted   Cerebellar stroke, acute ([place]) 11/22/2021   Acute CVA (cerebrovascular accident) ([place]) 11/22/2021   COPD (chronic obstructive pulmonary disease) ([place])    Pneumonia due to COVID-19 virus 10/14/2019   Hyponatremia 10/14/2019   Dementia without behavioral disturbance ([place]) 01/05/2019   Problematic consumption of alcohol 12/01/2018   Tooth impaction 12/01/2018   Ataxia 12/01/2018   First degree AV block    Hypotension due to hypovolemia    Syncope 06/18/2018   Leukopenia 06/18/2018   AV block, Mobitz II 06/18/2018   Memory loss 10/22/2016   Malnutrition of moderate degree 11/15/2015   Fall from steps 11/07/2015   Anemia 11/07/2015   Alcohol dependence ([place]) 11/07/2015   Pyuria 11/07/2015   Dehydration 08/08/2015   UTI (urinary tract infection) 08/08/2015   High anion gap metabolic acidosis 72/53/6644   Dizziness 07/31/2015   Orthostatic hypotension 07/31/2015   Lactic acidosis 07/31/2015   Ectopic atrial tachycardia (HCC)    Hypercholesteremia    Dyspnea on exertion 09/16/2014   Hypertension 09/16/2014    Orientation RESPIRATION BLADDER Height & Weight     Self  Normal Incontinent Weight: 157 lb (71.2 kg) Height:  5\' 8"  (172.7  cm)  BEHAVIORAL SYMPTOMS/MOOD NEUROLOGICAL BOWEL NUTRITION STATUS      Continent Diet (See DC summary)  AMBULATORY STATUS COMMUNICATION OF NEEDS Skin   Extensive Assist Verbally Normal                       Personal Care Assistance Level of Assistance  Bathing, Feeding, Dressing Bathing Assistance: Limited assistance Feeding assistance: Limited assistance Dressing Assistance: Limited assistance     Functional Limitations Info  Sight, Hearing, Speech Sight Info: Adequate Hearing Info: Impaired Speech Info: Adequate    SPECIAL CARE FACTORS FREQUENCY                       Contractures Contractures Info: Not present    Additional Factors Info  Code Status, Allergies Code Status Info: DNR Allergies Info: NKA           Current Medications (11/26/2021):  This is the current hospital active medication list Current Facility-Administered Medications  Medication Dose Route Frequency Provider Last Rate Last Admin    stroke: mapping our early stages of recovery book   Does not apply Once Lenore Cordia, MD       acetaminophen (TYLENOL) tablet 650 mg  650 mg Oral Q4H PRN Lenore Cordia, MD       Or   acetaminophen (TYLENOL) 160 MG/5ML solution 650 mg  650 mg Per Tube Q4H PRN Lenore Cordia, MD       Or   acetaminophen (TYLENOL) suppository 650 mg  650 mg Rectal Q4H PRN Zada Finders R, MD       albuterol (PROVENTIL) (2.5 MG/3ML) 0.083% nebulizer solution 2.5 mg  2.5 mg Nebulization Q6H PRN Lenore Cordia, MD       aspirin EC tablet 81 mg  81 mg Oral Q M,W,F Rosalin Hawking, MD   81 mg at 11/26/21 1043   atorvastatin (LIPITOR) tablet 40 mg  40 mg Oral Daily Rosalin Hawking, MD   40 mg at 11/26/21 1043   divalproex (DEPAKOTE) DR tablet 125 mg  125 mg Oral QHS Zada Finders R, MD   125 mg at 11/25/21 2104   escitalopram (LEXAPRO) tablet 10 mg  10 mg Oral QHS Zada Finders R, MD   10 mg at 11/25/21 2104   fludrocortisone (FLORINEF) tablet 0.1 mg  0.1 mg Oral q AM Zada Finders R,  MD   0.1 mg at 11/26/21 0630   hydrALAZINE (APRESOLINE) injection 10 mg  10 mg Intravenous Q6H PRN Lenore Cordia, MD       LORazepam (ATIVAN) tablet 0.25 mg  0.25 mg Oral BID Zada Finders R, MD   0.25 mg at 11/26/21 1044   senna-docusate (Senokot-S) tablet 1 tablet  1 tablet Oral QHS PRN Lenore Cordia, MD       thiamine (B-1) injection 100 mg  100 mg Intravenous Daily Donnetta Simpers, MD   100 mg at 11/23/21 2341   Or   thiamine tablet 100 mg  100 mg Oral Daily Donnetta Simpers, MD   100 mg at 11/26/21 1043     Discharge Medications: acetaminophen 325 MG tablet Commonly known as: TYLENOL Take 650 mg by mouth every 4 (four) hours as needed for fever.    albuterol (2.5 MG/3ML) 0.083% nebulizer solution Commonly known as: PROVENTIL Take 2.5 mg by nebulization every 6 (six) hours as needed for wheezing or shortness of breath.    albuterol 108 (90 Base) MCG/ACT inhaler Commonly known as: VENTOLIN HFA Inhale 2 puffs into the lungs 3 (three) times daily.    aspirin EC 81 MG tablet Take 1 tablet (81 mg total) by mouth daily. What changed: when to take this    atorvastatin 40 MG tablet Commonly known as: LIPITOR Take 1 tablet (40 mg total) by mouth daily.    Biofreeze 4 % Gel Generic drug: Menthol (Topical Analgesic) Apply 1 application topically 3 (three) times daily as needed (to affected sites).    cholecalciferol 25 MCG (1000 UNIT) tablet Commonly known as: VITAMIN D3 Take 1,000 Units by mouth daily.    Coal Tar 20 % Soln Apply 1 application topically See admin instructions. Shampoo the hair when bathing (re: Hospice)    Daily-Vite Multivitamin Tabs Take 1 tablet by mouth in the morning.    divalproex 125 MG DR tablet Commonly known as: DEPAKOTE Take 125 mg by mouth at bedtime.    escitalopram 10 MG tablet Commonly known as: LEXAPRO Take 10 mg by mouth at bedtime.    fludrocortisone 0.1 MG tablet Commonly known as: FLORINEF Take 0.1 mg by mouth in the  morning.    guaiFENesin-dextromethorphan 100-10 MG/5ML syrup Commonly known as: ROBITUSSIN DM Take 10 mLs by mouth every 4 (four) hours as needed for cough.    LORazepam 0.5 MG tablet Commonly known as: ATIVAN Take 0.5 tablets (0.25 mg total) by mouth in the morning and at bedtime.    Magnesium Oxide 250 MG Tabs Take 500 mg by mouth at bedtime.    miconazole 2 % cream  Commonly known as: MICOTIN Apply 1 application topically daily as needed (rash, jock itch).    OXYGEN Inhale 2 L/min into the lungs as needed (for shortness of breath).    Relevant Imaging Results:  Relevant Lab Results:   Additional Information [phone]  Geralynn Ochs, [place]

## 2021-11-26 NOTE — Progress Notes (Signed)
Attempted to give report. Not able to get in touch with the nurse. Spoke with Network engineer who stated they are aware pt is returning and he should be "fine" if we wanted to give report to give a call Inn the morning as administration is not available.

## 2021-11-26 NOTE — Care Management Important Message (Signed)
Important Message  Patient Details  Name: Victor Clarke MRN: [id number] Date of Birth: [birth year]   Medicare Important Message Given:  Yes     [name] 11/26/2021, 2:22 PM

## 2021-11-27 LAB — CULTURE, BLOOD (ROUTINE X 2)
Culture: NO GROWTH
Special Requests: ADEQUATE

## 2021-11-28 LAB — CULTURE, BLOOD (ROUTINE X 2)
Culture: NO GROWTH
Special Requests: ADEQUATE

## 2021-11-30 LAB — METHYLMALONIC ACID, SERUM: Methylmalonic Acid, Quantitative: 89 nmol/L (ref 0–378)

## 2022-01-28 NOTE — Progress Notes (Unsigned)
PATIENT: Victor Clarke DOB: 09-10-1929  REASON FOR VISIT: follow up HISTORY FROM: patient PRIMARY NEUROLOGIST:   HISTORY OF PRESENT ILLNESS: Today 01/28/22:  [name] is a 88 year old male who presented to the emergency room with increased confusion over 24 hours.  MRI of the head with and without contrast showed acute on subacute infarct in the right cerebellar hemisphere with a more subacute portion demonstrating hemosiderin deposition, consistent with hemorrhage. Additional punctate acute infarcts in the left parietal and right frontal lobe. Also right frontal convexity meningioma.  CTA head and neck: moderate right, and moderate to severe left Vertebral Artery Origin Stenosis. But no distal vertebral stenosis, and patent cerebellar artery origins. Heavily calcified ICA siphons with mild to moderate bilateral stenosis. And moderate stenosis of the left MCA anterior M2 branch. No hemorrhagic transformation or mass effect.  2D Echo - EF 60 - 65%. No cardiac source of emboli identified. LDL 94, currently on lipitor 40 mg daily  Hypertension: Hypotension an issue in hospital- on Florinef in hospital  Discharged from hospital on aspirin 81 mg daily.   HISTORY   REVIEW OF SYSTEMS: Out of a complete 14 system review of symptoms, the patient complains only of the following symptoms, and all other reviewed systems are negative.  ALLERGIES: No Known Allergies  HOME MEDICATIONS: Outpatient Medications Prior to Visit  Medication Sig Dispense Refill   acetaminophen (TYLENOL) 325 MG tablet Take 650 mg by mouth every 4 (four) hours as needed for fever.     albuterol (PROVENTIL) (2.5 MG/3ML) 0.083% nebulizer solution Take 2.5 mg by nebulization every 6 (six) hours as needed for wheezing or shortness of breath.     albuterol (VENTOLIN HFA) 108 (90 Base) MCG/ACT inhaler Inhale 2 puffs into the lungs 3 (three) times daily. (Patient not taking: Reported on 11/22/2021) 8 g 0   aspirin EC 81 MG  tablet Take 1 tablet (81 mg total) by mouth daily. 30 tablet 0   atorvastatin (LIPITOR) 40 MG tablet Take 1 tablet (40 mg total) by mouth daily. 30 tablet 0   BIOFREEZE 4 % GEL Apply 1 application topically 3 (three) times daily as needed (to affected sites).     cholecalciferol (VITAMIN D3) 25 MCG (1000 UT) tablet Take 1,000 Units by mouth daily.     Coal Tar 20 % SOLN Apply 1 application topically See admin instructions. Shampoo the hair when bathing (re: Hospice)     divalproex (DEPAKOTE) 125 MG DR tablet Take 125 mg by mouth at bedtime.     escitalopram (LEXAPRO) 10 MG tablet Take 10 mg by mouth at bedtime.     fludrocortisone (FLORINEF) 0.1 MG tablet Take 0.1 mg by mouth in the morning.     guaiFENesin-dextromethorphan (ROBITUSSIN DM) 100-10 MG/5ML syrup Take 10 mLs by mouth every 4 (four) hours as needed for cough. 118 mL 0   LORazepam (ATIVAN) 0.5 MG tablet Take 0.5 tablets (0.25 mg total) by mouth in the morning and at bedtime. 3 tablet 0   Magnesium Oxide 250 MG TABS Take 500 mg by mouth at bedtime.     miconazole (MICOTIN) 2 % cream Apply 1 application topically daily as needed (rash, jock itch).     Multiple Vitamin (DAILY-VITE MULTIVITAMIN) TABS Take 1 tablet by mouth in the morning.     OXYGEN Inhale 2 L/min into the lungs as needed (for shortness of breath).     No facility-administered medications prior to visit.    PAST MEDICAL HISTORY: Past Medical  History:  Diagnosis Date   Alcoholic peripheral neuropathy (HCC)    Allergy    Anemia    Arthritis    Cataract    left eye cataract removed   COPD (chronic obstructive pulmonary disease) ([place])    pt denies 02-03-17   Dehydration    Detached retina    a. s/p surgical correction on the right.   Ectopic atrial tachycardia ([place])    a. 08/2014 Echo: EF 55-60%, mildly dil LA.   EtOH dependence ([place])    Fall    Falls frequently 11/2018   Gout    Gout    Hypercholesteremia    Hypertension    Left inguinal hernia    a. s/p  repair ~ 10 yrs ago.   Malnutrition ([place])    Orthostatic hypotension    Parkinson's disease ([place])    Prostate cancer ([place])    a. s/p TURP.    PAST SURGICAL HISTORY: Past Surgical History:  Procedure Laterality Date   CATARACT EXTRACTION     on eye, possibly right eye   COLONOSCOPY     COLONOSCOPY W/ POLYPECTOMY  2001, 2006   HERNIA REPAIR     PROSTATE SURGERY      FAMILY HISTORY: Family History  Problem Relation Age of Onset   Stroke Father        died @ 84.   Heart attack Mother        died @ 76   Stroke Brother        died in his 28's   Heart attack Brother        died in his 52's   Colon cancer Neg Hx    Esophageal cancer Neg Hx    Rectal cancer Neg Hx    Stomach cancer Neg Hx    Prostate cancer Neg Hx     SOCIAL HISTORY: Social History   Socioeconomic History   Marital status: Widowed    Spouse name: Not on file   Number of children: 0   Years of education: College   Highest education level: Not on file  Occupational History   Occupation: Retired    Fish farm manager: OTHER  Tobacco Use   Smoking status: Former    Packs/day: 2.00    Years: 22.00    Pack years: 44.00    Types: Cigarettes    Quit date: 11/18/1977    Years since quitting: 44.2   Smokeless tobacco: Never  Vaping Use   Vaping Use: Never used  Substance and Sexual Activity   Alcohol use: No    Alcohol/week: 2.0 standard drinks    Types: 1 Shots of liquor, 1 Standard drinks or equivalent per week    Comment: update 01/05/2019 none in past month    Drug use: No   Sexual activity: Never  Other Topics Concern   Not on file  Social History Narrative   Patient lives at an assisted living facility, [facility].  Has no children.     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     Education: college degree.   Caffeine Use: 1 cup occasionally   Social Determinants of Health   Financial Resource Strain: Not on file  Food Insecurity: Not on file  Transportation Needs: Not on file   Physical Activity: Not on file  Stress: Not on file  Social Connections: Not on file  Intimate Partner Violence: Not on file      PHYSICAL EXAM  There were no vitals filed  for this visit. There is no height or weight on file to calculate BMI.  Generalized: Well developed, in no acute distress   Neurological examination  Mentation: Alert oriented to time, place, history taking. Follows all commands speech and language fluent Cranial nerve II-XII: Pupils were equal round reactive to light. Extraocular movements were full, visual field were full on confrontational test. Facial sensation and strength were normal. Uvula tongue midline. Head turning and shoulder shrug  were normal and symmetric. Motor: The motor testing reveals 5 over 5 strength of all 4 extremities. Good symmetric motor tone is noted throughout.  Sensory: Sensory testing is intact to soft touch on all 4 extremities. No evidence of extinction is noted.  Coordination: Cerebellar testing reveals good finger-nose-finger and heel-to-shin bilaterally.  Gait and station: Gait is normal. Tandem gait is normal. Romberg is negative. No drift is seen.  Reflexes: Deep tendon reflexes are symmetric and normal bilaterally.   DIAGNOSTIC DATA (LABS, IMAGING, TESTING) - I reviewed patient records, labs, notes, testing and imaging myself where available.  Lab Results  Component Value Date   WBC 6.8 11/23/2021   HGB 12.6 (L) 11/23/2021   HCT 38.7 (L) 11/23/2021   MCV 88.2 11/23/2021   PLT 192 11/23/2021      Component Value Date/Time   NA 137 11/23/2021 0342   NA 143 06/30/2018 1021   K 3.7 11/23/2021 0342   CL 104 11/23/2021 0342   CO2 25 11/23/2021 0342   GLUCOSE 101 (H) 11/23/2021 0342   BUN 16 11/23/2021 0342   BUN 19 06/30/2018 1021   CREATININE 0.93 11/23/2021 0342   CALCIUM 8.6 (L) 11/23/2021 0342   PROT 6.9 11/22/2021 1455   ALBUMIN 3.4 (L) 11/22/2021 1455   AST 16 11/22/2021 1455   ALT 12 11/22/2021 1455    ALKPHOS 41 11/22/2021 1455   BILITOT 1.0 11/22/2021 1455   GFRNONAA >60 11/23/2021 0342   GFRAA >60 10/24/2019 0424   Lab Results  Component Value Date   CHOL 137 11/23/2021   HDL 26 (L) 11/23/2021   LDLCALC 94 11/23/2021   TRIG 85 11/23/2021   CHOLHDL 5.3 11/23/2021   Lab Results  Component Value Date   HGBA1C 5.7 (H) 11/23/2021   Lab Results  Component Value Date   [id number] 852 11/24/2021   Lab Results  Component Value Date   TSH 2.891 11/24/2021      ASSESSMENT AND PLAN 88 y.o. year old male  has a past medical history of Alcoholic peripheral neuropathy ([place]), Allergy, Anemia, Arthritis, Cataract, COPD (chronic obstructive pulmonary disease) ([place]), Dehydration, Detached retina, Ectopic atrial tachycardia ([place]), EtOH dependence ([place]), Fall, Falls frequently (11/2018), Gout, Gout, Hypercholesteremia, Hypertension, Left inguinal hernia, Malnutrition ([place]), Orthostatic hypotension, Parkinson's disease ([place]), and Prostate cancer ([place]). here with ***  Cardiac Monitor    Ward Givens, MSN, NP-C 01/28/2022, 3:22 PM [facility] [address], [place], [place] [address] [phone]

## 2022-01-29 ENCOUNTER — Encounter: Payer: Self-pay | Admitting: Adult Health

## 2022-01-29 ENCOUNTER — Ambulatory Visit (INDEPENDENT_AMBULATORY_CARE_PROVIDER_SITE_OTHER): Payer: Medicare Other | Admitting: Adult Health

## 2022-01-29 VITALS — BP 132/69 | HR 73 | Ht 69.0 in | Wt 161.4 lb

## 2022-01-29 DIAGNOSIS — I639 Cerebral infarction, unspecified: Secondary | ICD-10-CM

## 2022-02-07 NOTE — Progress Notes (Signed)
I agree with the above plan 

## 2022-08-01 ENCOUNTER — Encounter: Payer: Self-pay | Admitting: Adult Health

## 2022-08-01 ENCOUNTER — Ambulatory Visit (INDEPENDENT_AMBULATORY_CARE_PROVIDER_SITE_OTHER): Payer: Medicare Other | Admitting: Adult Health

## 2022-08-01 VITALS — BP 142/81 | HR 65 | Ht 68.0 in | Wt 160.0 lb

## 2022-08-01 DIAGNOSIS — F0390 Unspecified dementia without behavioral disturbance: Secondary | ICD-10-CM

## 2022-08-01 DIAGNOSIS — I639 Cerebral infarction, unspecified: Secondary | ICD-10-CM

## 2022-08-01 NOTE — Progress Notes (Signed)
PATIENT: Victor Clarke DOB: 1929/01/17  REASON FOR VISIT: follow up HISTORY FROM: patient PRIMARY NEUROLOGIST: Dr. Jaynee Eagles  Chief Complaint  Patient presents with   Follow-up    Pt in 7 Pt states memory is worse       HISTORY OF PRESENT ILLNESS: Today 08/01/22: Victor Clarke is a 88 year old male with a history of memory disturbance.  Reports memory is worse. Lives at [place]. Reports that he can do most things for himself in regards to ADLs. Reports decreased appetite. No changes in mood or behavior.   no stroke-like symptoms. Remains on ASA.  He comes to the visit alone today.  01/29/22: Victor Clarke is a 88 year old male who presented to the emergency room with increased confusion over 24 hours.  MRI of the head with and without contrast showed acute on subacute infarct in the right cerebellar hemisphere with a more subacute portion demonstrating hemosiderin deposition, consistent with hemorrhage. Additional punctate acute infarcts in the left parietal and right frontal lobe. Also right frontal convexity meningioma.  CTA head and neck: moderate right, and moderate to severe left Vertebral Artery Origin Stenosis. But no distal vertebral stenosis, and patent cerebellar artery origins. Heavily calcified ICA siphons with mild to moderate bilateral stenosis. And moderate stenosis of the left MCA anterior M2 branch. No hemorrhagic transformation or mass effect.  2D Echo - EF 60 - 65%. No cardiac source of emboli identified. LDL 94, currently on lipitor 40 mg daily  Hypertension: Hypotension an issue in hospital- on Florinef in hospital, remains on Florinef  Discharged from hospital on aspirin 81 mg daily.  Today, patient is here from [facility].  He does not have a caregiver or family member with him.  He does not remember being in the hospital or what for. He does not recall why he is here today. I reviewed med list from facility. He remains on aspirin and lipitor.       REVIEW OF SYSTEMS: Out of a complete 14 system review of symptoms, the patient complains only of the following symptoms, and all other reviewed systems are negative.  ALLERGIES: No Known Allergies  HOME MEDICATIONS: Outpatient Medications Prior to Visit  Medication Sig Dispense Refill   acetaminophen (TYLENOL) 325 MG tablet Take 650 mg by mouth every 4 (four) hours as needed for fever.     albuterol (PROVENTIL) (2.5 MG/3ML) 0.083% nebulizer solution Take 2.5 mg by nebulization every 6 (six) hours as needed for wheezing or shortness of breath.     albuterol (VENTOLIN HFA) 108 (90 Base) MCG/ACT inhaler Inhale 2 puffs into the lungs 3 (three) times daily. 8 g 0   aspirin EC 81 MG tablet Take 1 tablet (81 mg total) by mouth daily. 30 tablet 0   atorvastatin (LIPITOR) 40 MG tablet Take 1 tablet (40 mg total) by mouth daily. 30 tablet 0   BIOFREEZE 4 % GEL Apply 1 application topically 3 (three) times daily as needed (to affected sites).     cholecalciferol (VITAMIN D3) 25 MCG (1000 UT) tablet Take 1,000 Units by mouth daily.     Coal Tar 20 % SOLN Apply 1 application topically See admin instructions. Shampoo the hair when bathing (re: Hospice)     divalproex (DEPAKOTE) 125 MG DR tablet Take 125 mg by mouth at bedtime.     escitalopram (LEXAPRO) 10 MG tablet Take 10 mg by mouth at bedtime.     fludrocortisone (FLORINEF) 0.1 MG tablet Take 0.1 mg by  mouth in the morning.     guaiFENesin-dextromethorphan (ROBITUSSIN DM) 100-10 MG/5ML syrup Take 10 mLs by mouth every 4 (four) hours as needed for cough. 118 mL 0   LORazepam (ATIVAN) 0.5 MG tablet Take 0.5 tablets (0.25 mg total) by mouth in the morning and at bedtime. 3 tablet 0   Magnesium Oxide 250 MG TABS Take 500 mg by mouth at bedtime.     miconazole (MICOTIN) 2 % cream Apply 1 application topically daily as needed (rash, jock itch).     Multiple Vitamin (DAILY-VITE MULTIVITAMIN) TABS Take 1 tablet by mouth in the morning.     OXYGEN  Inhale 2 L/min into the lungs as needed (for shortness of breath).     No facility-administered medications prior to visit.    PAST MEDICAL HISTORY: Past Medical History:  Diagnosis Date   Alcoholic peripheral neuropathy (HCC)    Allergy    Anemia    Arthritis    Cataract    left eye cataract removed   COPD (chronic obstructive pulmonary disease) ([place])    pt denies 02-03-17   Dehydration    Detached retina    a. s/p surgical correction on the right.   Ectopic atrial tachycardia ([place])    a. 08/2014 Echo: EF 55-60%, mildly dil LA.   EtOH dependence ([place])    Fall    Falls frequently 11/2018   Gout    Gout    Hypercholesteremia    Hypertension    Left inguinal hernia    a. s/p repair ~ 10 yrs ago.   Malnutrition ([place])    Orthostatic hypotension    Parkinson's disease ([place])    Prostate cancer ([place])    a. s/p TURP.    PAST SURGICAL HISTORY: Past Surgical History:  Procedure Laterality Date   CATARACT EXTRACTION     on eye, possibly right eye   COLONOSCOPY     COLONOSCOPY W/ POLYPECTOMY  2001, 2006   HERNIA REPAIR     PROSTATE SURGERY      FAMILY HISTORY: Family History  Problem Relation Age of Onset   Stroke Father        died @ 18.   Heart attack Mother        died @ 24   Stroke Brother        died in his 84's   Heart attack Brother        died in his 105's   Colon cancer Neg Hx    Esophageal cancer Neg Hx    Rectal cancer Neg Hx    Stomach cancer Neg Hx    Prostate cancer Neg Hx     SOCIAL HISTORY: Social History   Socioeconomic History   Marital status: Widowed    Spouse name: Not on file   Number of children: 0   Years of education: College   Highest education level: Not on file  Occupational History   Occupation: Retired    Fish farm manager: OTHER  Tobacco Use   Smoking status: Former    Packs/day: 2.00    Years: 22.00    Total pack years: 44.00    Types: Cigarettes    Quit date: 11/18/1977    Years since quitting: 44.7   Smokeless tobacco:  Never  Vaping Use   Vaping Use: Never used  Substance and Sexual Activity   Alcohol use: No    Alcohol/week: 2.0 standard drinks of alcohol    Types: 1 Shots of liquor, 1 Standard drinks or equivalent  per week    Comment: update 01/05/2019 none in past month    Drug use: No   Sexual activity: Never  Other Topics Concern   Not on file  Social History Narrative   Patient lives at an assisted living facility, [facility].  Has no children.     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     Education: college degree.   Caffeine Use: 1 cup occasionally   Social Determinants of Health   Financial Resource Strain: Not on file  Food Insecurity: Not on file  Transportation Needs: Not on file  Physical Activity: Not on file  Stress: Not on file  Social Connections: Not on file  Intimate Partner Violence: Not on file      PHYSICAL EXAM  Vitals:   08/01/22 1010  BP: (!) 142/81  Pulse: 65  Weight: 160 lb (72.6 kg)  Height: '5\' 8"'$  (1.727 m)   Body mass index is 24.33 kg/m.     08/01/2022   10:12 AM 01/29/2022    9:21 AM 01/27/2020    1:36 PM  MMSE - Mini Mental State Exam  Orientation to time 0 3 3  Orientation to Place '3 2 3  '$ Registration '3 3 3  '$ Attention/ Calculation '2 2 1  '$ Recall 0 1 0  Language- name 2 objects '2 2 2  '$ Language- repeat 1 1 0  Language- repeat-comments   he said if ands or buts  Language- follow 3 step command '2 3 2  '$ Language- follow 3 step command-comments   he didnt fold the paper  Language- read & follow direction '1 1 1  '$ Write a sentence 1 0 0  Copy design 0 0 0  Total score '15 18 15     '$ Generalized: Well developed, in no acute distress   Neurological examination  Mentation: Alert. Follows all commands speech and language fluent Cranial nerve II-XII: Pupils were equal round reactive to light. Extraocular movements were full, visual field were full on confrontational test. Facial sensation and strength were normal. Head turning  and shoulder shrug  were normal and symmetric. Motor: The motor testing reveals 5 over 5 strength of all 4 extremities. Good symmetric motor tone is noted throughout.  Sensory: Sensory testing is intact to soft touch on all 4 extremities. No evidence of extinction is noted.  Coordination: Cerebellar testing reveals good finger-nose-finger and heel-to-shin bilaterally.  Gait and station: Uses a cane when ambulating.   DIAGNOSTIC DATA (LABS, IMAGING, TESTING) - I reviewed patient records, labs, notes, testing and imaging myself where available.  Lab Results  Component Value Date   WBC 6.8 11/23/2021   HGB 12.6 (L) 11/23/2021   HCT 38.7 (L) 11/23/2021   MCV 88.2 11/23/2021   PLT 192 11/23/2021      Component Value Date/Time   NA 137 11/23/2021 0342   NA 143 06/30/2018 1021   K 3.7 11/23/2021 0342   CL 104 11/23/2021 0342   CO2 25 11/23/2021 0342   GLUCOSE 101 (H) 11/23/2021 0342   BUN 16 11/23/2021 0342   BUN 19 06/30/2018 1021   CREATININE 0.93 11/23/2021 0342   CALCIUM 8.6 (L) 11/23/2021 0342   PROT 6.9 11/22/2021 1455   ALBUMIN 3.4 (L) 11/22/2021 1455   AST 16 11/22/2021 1455   ALT 12 11/22/2021 1455   ALKPHOS 41 11/22/2021 1455   BILITOT 1.0 11/22/2021 1455   GFRNONAA >60 11/23/2021 0342   GFRAA >60 10/24/2019 0424   Lab Results  Component Value Date   CHOL 137 11/23/2021   HDL 26 (L) 11/23/2021   LDLCALC 94 11/23/2021   TRIG 85 11/23/2021   CHOLHDL 5.3 11/23/2021   Lab Results  Component Value Date   HGBA1C 5.7 (H) 11/23/2021   Lab Results  Component Value Date   VITAMINB12 644 11/24/2021   Lab Results  Component Value Date   TSH 2.891 11/24/2021      ASSESSMENT AND PLAN 88 y.o. year old male  has a past medical history of Alcoholic peripheral neuropathy ([place]), Allergy, Anemia, Arthritis, Cataract, COPD (chronic obstructive pulmonary disease) ([place]), Dehydration, Detached retina, Ectopic atrial tachycardia ([place]), EtOH dependence ([place]), Fall, Falls  frequently (11/2018), Gout, Gout, Hypercholesteremia, Hypertension, Left inguinal hernia, Malnutrition ([place]), Orthostatic hypotension, Parkinson's disease ([place]), and Prostate cancer ([place]). here with    Cerebellar stroke  Continue aspirin 81 mg daily Continue Lipitor 40 mg with LDL goal less than 70  Memory disturbance  MMSE 15/30 Continue to monitor   Patient will follow-up with our office as needed.  He has regular follow-ups with his primary care through his facility   Ward Givens, MSN, NP-C 08/01/2022, 10:30 AM [facility] [address] [place],  [address] [phone]

## 2022-10-04 ENCOUNTER — Emergency Department (HOSPITAL_COMMUNITY)
Admission: EM | Admit: 2022-10-04 | Discharge: 2022-10-04 | Disposition: A | Discharge status: Skilled Nursing Facility | Payer: Medicare Other | Attending: Emergency Medicine | Admitting: Emergency Medicine

## 2022-10-04 ENCOUNTER — Other Ambulatory Visit: Payer: Self-pay

## 2022-10-04 ENCOUNTER — Emergency Department (HOSPITAL_COMMUNITY): Payer: Medicare Other

## 2022-10-04 ENCOUNTER — Encounter (HOSPITAL_COMMUNITY): Payer: Self-pay | Admitting: Emergency Medicine

## 2022-10-04 DIAGNOSIS — R4182 Altered mental status, unspecified: Secondary | ICD-10-CM | POA: Diagnosis present

## 2022-10-04 DIAGNOSIS — R451 Restlessness and agitation: Secondary | ICD-10-CM | POA: Insufficient documentation

## 2022-10-04 DIAGNOSIS — Z7982 Long term (current) use of aspirin: Secondary | ICD-10-CM | POA: Diagnosis not present

## 2022-10-04 LAB — URINALYSIS, ROUTINE W REFLEX MICROSCOPIC
Bilirubin Urine: NEGATIVE
Glucose, UA: NEGATIVE mg/dL
Hgb urine dipstick: NEGATIVE
Ketones, ur: NEGATIVE mg/dL
Leukocytes,Ua: NEGATIVE
Nitrite: NEGATIVE
Protein, ur: NEGATIVE mg/dL
Specific Gravity, Urine: 1.01 (ref 1.005–1.030)
pH: 7 (ref 5.0–8.0)

## 2022-10-04 LAB — CBC WITH DIFFERENTIAL/PLATELET
Abs Immature Granulocytes: 0.03 10*3/uL (ref 0.00–0.07)
Basophils Absolute: 0 10*3/uL (ref 0.0–0.1)
Basophils Relative: 1 %
Eosinophils Absolute: 0.2 10*3/uL (ref 0.0–0.5)
Eosinophils Relative: 3 %
HCT: 37.9 % — ABNORMAL LOW (ref 39.0–52.0)
Hemoglobin: 12.6 g/dL — ABNORMAL LOW (ref 13.0–17.0)
Immature Granulocytes: 1 %
Lymphocytes Relative: 23 %
Lymphs Abs: 1.3 10*3/uL (ref 0.7–4.0)
MCH: 29 pg (ref 26.0–34.0)
MCHC: 33.2 g/dL (ref 30.0–36.0)
MCV: 87.3 fL (ref 80.0–100.0)
Monocytes Absolute: 0.5 10*3/uL (ref 0.1–1.0)
Monocytes Relative: 10 %
Neutro Abs: 3.4 10*3/uL (ref 1.7–7.7)
Neutrophils Relative %: 62 %
Platelets: 205 10*3/uL (ref 150–400)
RBC: 4.34 MIL/uL (ref 4.22–5.81)
RDW: 14.6 % (ref 11.5–15.5)
WBC: 5.4 10*3/uL (ref 4.0–10.5)
nRBC: 0 % (ref 0.0–0.2)

## 2022-10-04 LAB — COMPREHENSIVE METABOLIC PANEL
ALT: 11 U/L (ref 0–44)
AST: 18 U/L (ref 15–41)
Albumin: 3.4 g/dL — ABNORMAL LOW (ref 3.5–5.0)
Alkaline Phosphatase: 59 U/L (ref 38–126)
Anion gap: 8 (ref 5–15)
BUN: 18 mg/dL (ref 8–23)
CO2: 26 mmol/L (ref 22–32)
Calcium: 9.1 mg/dL (ref 8.9–10.3)
Chloride: 103 mmol/L (ref 98–111)
Creatinine, Ser: 1.2 mg/dL (ref 0.61–1.24)
GFR, Estimated: 57 mL/min — ABNORMAL LOW (ref >60)
Glucose, Bld: 104 mg/dL — ABNORMAL HIGH (ref 70–99)
Potassium: 3.9 mmol/L (ref 3.5–5.1)
Sodium: 137 mmol/L (ref 135–145)
Total Bilirubin: 0.6 mg/dL (ref 0.3–1.2)
Total Protein: 6.9 g/dL (ref 6.5–8.1)

## 2022-10-04 MED ORDER — DOXYCYCLINE HYCLATE 100 MG PO CAPS: 100.0000 mg | ORAL_CAPSULE | Freq: Two times a day (BID) | ORAL | 0 refills | Status: AC

## 2022-10-04 MED ORDER — DOXYCYCLINE HYCLATE 100 MG PO TABS
100.0000 mg | ORAL_TABLET | Freq: Once | ORAL | Status: AC
  Administered 2022-10-04: 100 mg via ORAL
  Filled 2022-10-04: qty 1

## 2022-10-04 NOTE — ED Notes (Signed)
[name] called pt is number 4 on the list 

## 2022-10-04 NOTE — ED Provider Notes (Signed)
[facility] EMERGENCY DEPARTMENT Provider Note   CSN: [id number] Arrival date & time: 10/04/22  1734     History  Chief Complaint  Patient presents with   Altered Mental Status    Victor Clarke is a 88 y.o. male.  Patient here with altered mental status and increased agitation from assisted living facility.  Patient has no complaints.  He denies any chest pain or shortness of breath.  He can tell me his name.  Does not have any complaints.  He had normal vitals with EMS.  He denies any falls.  He uses a walker to ambulate.  He has not had any trouble eating or drinking.  He denies any headache or abdominal pain or nausea or vomiting.  No pain with urination.  The history is provided by the patient.       Home Medications Prior to Admission medications   Medication Sig Start Date End Date Taking? Authorizing Provider  doxycycline (VIBRAMYCIN) 100 MG capsule Take 1 capsule (100 mg total) by mouth 2 (two) times daily for 7 days. 10/04/22 10/11/22 Yes Mory Herrman, DO  acetaminophen (TYLENOL) 325 MG tablet Take 650 mg by mouth every 4 (four) hours as needed for fever.    [provider]  albuterol (PROVENTIL) (2.5 MG/3ML) 0.083% nebulizer solution Take 2.5 mg by nebulization every 6 (six) hours as needed for wheezing or shortness of breath.    [provider]  albuterol (VENTOLIN HFA) 108 (90 Base) MCG/ACT inhaler Inhale 2 puffs into the lungs 3 (three) times daily. 10/18/19   Manuella Ghazi, Pratik D, DO  aspirin EC 81 MG tablet Take 1 tablet (81 mg total) by mouth daily. 11/26/21   Patrecia Pour, MD  atorvastatin (LIPITOR) 40 MG tablet Take 1 tablet (40 mg total) by mouth daily. 11/26/21   Patrecia Pour, MD  BIOFREEZE 4 % GEL Apply 1 application topically 3 (three) times daily as needed (to affected sites).    [provider]  cholecalciferol (VITAMIN D3) 25 MCG (1000 UT) tablet Take 1,000 Units by mouth daily.    [provider]  Coal Tar  20 % SOLN Apply 1 application topically See admin instructions. Shampoo the hair when bathing (re: Hospice)    [provider]  divalproex (DEPAKOTE) 125 MG DR tablet Take 125 mg by mouth at bedtime.    [provider]  escitalopram (LEXAPRO) 10 MG tablet Take 10 mg by mouth at bedtime.    [provider]  fludrocortisone (FLORINEF) 0.1 MG tablet Take 0.1 mg by mouth in the morning. 11/02/21   [provider]  guaiFENesin-dextromethorphan (ROBITUSSIN DM) 100-10 MG/5ML syrup Take 10 mLs by mouth every 4 (four) hours as needed for cough. 10/18/19   Manuella Ghazi, Pratik D, DO  LORazepam (ATIVAN) 0.5 MG tablet Take 0.5 tablets (0.25 mg total) by mouth in the morning and at bedtime. 11/26/21   Patrecia Pour, MD  Magnesium Oxide 250 MG TABS Take 500 mg by mouth at bedtime.    [provider]  miconazole (MICOTIN) 2 % cream Apply 1 application topically daily as needed (rash, jock itch).    [provider]  Multiple Vitamin (DAILY-VITE MULTIVITAMIN) TABS Take 1 tablet by mouth in the morning.    [provider]  OXYGEN Inhale 2 L/min into the lungs as needed (for shortness of breath).    [provider]      Allergies    Patient has no known allergies.  Review of Systems   Review of Systems  Physical Exam Updated Vital Signs BP (!) 153/83   Pulse 60   Temp 97.9 F (36.6 C) (Oral)   Resp 18   SpO2 94%  Physical Exam Vitals and nursing note reviewed.  Constitutional:      General: He is not in acute distress.    Appearance: He is well-developed. He is not ill-appearing.  HENT:     Head: Normocephalic and atraumatic.     Nose: Nose normal.  Eyes:     Extraocular Movements: Extraocular movements intact.     Conjunctiva/sclera: Conjunctivae normal.     Pupils: Pupils are equal, round, and reactive to light.  Cardiovascular:     Rate and Rhythm: Normal rate and regular rhythm.     Pulses: Normal pulses.     Heart sounds:  Normal heart sounds. No murmur heard. Pulmonary:     Effort: Pulmonary effort is normal. No respiratory distress.     Breath sounds: Normal breath sounds.  Abdominal:     Palpations: Abdomen is soft.     Tenderness: There is no abdominal tenderness.  Musculoskeletal:        General: No swelling.     Cervical back: Normal range of motion and neck supple.  Skin:    General: Skin is warm and dry.     Capillary Refill: Capillary refill takes less than 2 seconds.  Neurological:     General: No focal deficit present.     Mental Status: He is alert.     Cranial Nerves: No cranial nerve deficit.     Sensory: No sensory deficit.     Motor: No weakness.     Coordination: Coordination normal.     Comments: 5+ out of 5 strength throughout, normal sensation, no drift, normal speech, pleasant, does not appear to be confused and answers questions appropriately  Psychiatric:        Mood and Affect: Mood normal.     ED Results / Procedures / Treatments   Labs (all labs ordered are listed, but only abnormal results are displayed) Labs Reviewed  CBC WITH DIFFERENTIAL/PLATELET - Abnormal; Notable for the following components:      Result Value   Hemoglobin 12.6 (*)    HCT 37.9 (*)    All other components within normal limits  COMPREHENSIVE METABOLIC PANEL - Abnormal; Notable for the following components:   Glucose, Bld 104 (*)    Albumin 3.4 (*)    GFR, Estimated 57 (*)    All other components within normal limits  URINALYSIS, ROUTINE W REFLEX MICROSCOPIC    EKG EKG Interpretation  Date/Time:  Friday October 04 2022 17:43:18 EST Ventricular Rate:  66 PR Interval:  309 QRS Duration: 108 QT Interval:  431 QTC Calculation: 452 R Axis:   254 Text Interpretation: Sinus or ectopic atrial rhythm V ertrophy Confirmed by Lennice Sites ([id number]) on 10/04/2022 6:08:42 PM  Radiology CT HEAD WO CONTRAST (5MM)  Result Date: 10/04/2022 CLINICAL DATA:  Altered mental status EXAM: CT HEAD WITHOUT  CONTRAST TECHNIQUE: Contiguous axial images were obtained from the base of the skull through the vertex without intravenous contrast. RADIATION DOSE REDUCTION: This exam was performed according to the departmental dose-optimization program which includes automated exposure control, adjustment of the mA and/or kV according to patient size and/or use of iterative reconstruction technique. COMPARISON:  11/23/2021 FINDINGS: Brain: No acute intracranial findings are seen. There are no signs of bleeding within the cranium. There is a  low-density in the posterior right cerebellum suggesting encephalomalacia from old infarction. Cortical sulci are prominent. There is decreased density in periventricular and subcortical white matter. Vascular: Scattered arterial calcifications are seen. Skull: No fracture is seen in calvarium. There is contusion/hematoma in the right frontal scalp. Sinuses/Orbits: Unremarkable. Other: None. IMPRESSION: Old infarct is seen in right cerebellum. Atrophy. Small-vessel disease. No acute intracranial findings are seen. Electronically Signed   By: Elmer Picker M.D.   On: 10/04/2022 18:19   DG Chest Portable 1 View  Result Date: 10/04/2022 CLINICAL DATA:  Altered mental status. EXAM: PORTABLE CHEST 1 VIEW COMPARISON:  Chest x-ray 11/22/2021 FINDINGS: Heart is enlarged, unchanged. The aorta is ectatic. There are patchy bibasilar opacities, right greater than left. No pleural effusion or pneumothorax identified. No acute fractures. IMPRESSION: Patchy bibasilar opacities, right greater than left, suspicious for pneumonia. Electronically Signed   By: Ronney Asters M.D.   On: 10/04/2022 18:13    Procedures Procedures    Medications Ordered in ED Medications  doxycycline (VIBRA-TABS) tablet 100 mg (has no administration in time range)    ED Course/ Medical Decision Making/ A&P                           Medical Decision Making Amount and/or Complexity of Data Reviewed Labs:  ordered. Radiology: ordered.  Risk Prescription drug management.   Victor Clarke is here with altered mental status.  Normal vitals.  No fever.  History of high cholesterol, may be some memory deficit per chart review.  History of COPD, Parkinson's, CVA.  Patient has no complaints.  He was sent here from facility due to some confusion this afternoon but he appears appropriate on exam with me.  Denies any falls.  Denies any chest pain or shortness of breath.  Neurologically appears to be intact.  His vital signs are normal.  He has no fever.  Will initiate broad work-up to evaluate for differential diagnosis of possibly dehydration versus anemia versus head bleed versus UTI or pneumonia.  We will get CBC, CMP, chest x-ray, urinalysis, CT scan of head.  EKG per my review and interpretation shows sinus rhythm.  No ischemic changes.  Per my review and interpretation of labs is no significant anemia or electrolyte abnormality or kidney injury.  Chest x-ray per radiology for maybe with pneumonia.  Clinically does not seem consistent with pneumonia but will do a short course of antibiotics for this.  CT scan of the head unremarkable.  Urinalysis negative for infection.  Discussed with family that I will be sending him home with antibiotic for possible pneumonia but he is stable for discharge.  No concern for sepsis.  He has been stable here throughout my care.  This chart was dictated using voice recognition software.  Despite best efforts to proofread,  errors can occur which can change the documentation meaning.         Final Clinical Impression(s) / ED Diagnoses Final diagnoses:  Altered mental status, unspecified altered mental status type    Rx / DC Orders ED Discharge Orders          Ordered    doxycycline (VIBRAMYCIN) 100 MG capsule  2 times daily        10/04/22 2039              Lennice Sites, DO 10/04/22 2039

## 2022-10-04 NOTE — ED Triage Notes (Signed)
Per EMS, Pt, from [facility], presents d/t acute confusion and increased agitation.  Staff reported onset around 1hr prior to EMS arrival.  Pt has no complaints.   Pt A&Ox4 w/ EMS.

## 2022-10-04 NOTE — Discharge Instructions (Addendum)
Overall may be a pneumonia on chest x-ray but work-up today is normal.  Continue antibiotics as prescribed.  Next dose antibiotic tomorrow morning.

## 2022-10-04 NOTE — ED Notes (Signed)
Attempted to call [facility] x3 for discharge report, no response. Report will be given to transport services

## 2022-10-18 ENCOUNTER — Non-Acute Institutional Stay: Payer: Medicare Other | Admitting: Hospice

## 2022-10-18 DIAGNOSIS — F03918 Unspecified dementia, unspecified severity, with other behavioral disturbance: Secondary | ICD-10-CM

## 2022-10-18 DIAGNOSIS — F419 Anxiety disorder, unspecified: Secondary | ICD-10-CM

## 2022-10-18 DIAGNOSIS — Z515 Encounter for palliative care: Secondary | ICD-10-CM

## 2022-10-18 DIAGNOSIS — G20A2 Parkinson's disease without dyskinesia, with fluctuations: Secondary | ICD-10-CM

## 2022-10-18 DIAGNOSIS — J449 Chronic obstructive pulmonary disease, unspecified: Secondary | ICD-10-CM

## 2022-10-18 NOTE — Progress Notes (Signed)
[facility] Consult Note Telephone: [phone]  Fax: [phone]  PATIENT NAME: Victor Clarke [id number] [phone] (home)  DOB: 05/09/1929 MRN: [id number]  PRIMARY CARE PROVIDER:    Sherald Hess., MD,  [address] [place] [address] [phone]  REFERRING PROVIDER:   Sherald Hess., MD [place],  [place] [address] [phone]  RESPONSIBLE PARTY:   [place]     Name Relation Home Work Hanna Niece [phone]  [phone]   Raymond Gurney Niece   [phone]        I met face to face with patient at facility. Visit to build trust and highlight Palliative Medicine as specialized medical care for people living with serious illness, aimed at facilitating better quality of life through symptoms relief, assisting with advance care planning and complex medical decision making.  NP called [name] and obtained additional information since patients with dementia. ASSESSMENT AND / RECOMMENDATIONS:   Advance Care Planning: Our advance care planning conversation included a discussion about:    The value and importance of advance care planning  Difference between Hospice and Palliative care Exploration of goals of care in the event of a sudden injury or illness  Identification and preparation of a healthcare agent  Review and updating or creation of an  advance directive document . Decision not to resuscitate or to de-escalate disease focused treatments due to poor prognosis.  CODE STATUS: Do Not Resuscitate.  Goals of Care: Goals include to maximize quality of life and symptom management. [name] reported that patient was a previous hospice patient. Family is interested in patient getting back to hospice service when he qualifies for it.  I spent 20  minutes providing this initial consultation. More than 50% of the time in this  consultation was spent on counseling patient and coordinating communication. --------------------------------------------------------------------------------------------------------------------------------------  Symptom Management/Plan: Dementia: Memory loss/confusion is ongoing.  Continue ongoing supportive care.  Fall and safety precautions. COPD: No recent exacerbation.  Albuterol as needed.  Robitussin as ordered for cough. Anxiety with agitation: Continue Ativan, Depakote as ordered. Routine CBC CMP. Follow up: Palliative care will continue to follow for complex medical decision making, advance care planning, and clarification of goals. Return 6 weeks or prn. Encouraged to call provider sooner with any concerns.   Family /Caregiver/Community Supports: Patient in Assisted Living for ongoing care  HOSPICE ELIGIBILITY/DIAGNOSIS: TBD  Chief Complaint: Initial Palliative care visit  HISTORY OF PRESENT ILLNESS:  Victor Clarke is a 88 y.o. year old male  with multiple morbidities requiring close monitoring and with high risk of complications and  mortality:   History of COPD, dementia Parkinson's disease, CVA, anxiety with agitation. History obtained from review of EMR, discussion with primary team, caregiver, family and/or Victor Clarke.  Review and summarization of Epic records shows history from other than patient. Rest of 10 point ROS asked and negative. Independent interpretation of tests and reviewed as needed, available labs, patient records, imaging, studies and related documents from the EMR.  PAST MEDICAL HISTORY:  Active Ambulatory Problems    Diagnosis Date Noted   Dyspnea on exertion 09/16/2014   Hypertension 09/16/2014   Ectopic atrial tachycardia    Hypercholesteremia    High anion gap metabolic acidosis 50/56/9794   Dizziness 07/31/2015   Orthostatic hypotension 07/31/2015   Lactic acidosis 07/31/2015   Dehydration 08/08/2015   UTI (urinary tract infection) 08/08/2015  Fall from steps 11/07/2015   Anemia 11/07/2015   Alcohol dependence ([place]) 11/07/2015   Pyuria 11/07/2015   Malnutrition of moderate degree 11/15/2015   Memory loss 10/22/2016   Syncope 06/18/2018   Leukopenia 06/18/2018   AV block, Mobitz II 06/18/2018   First degree AV block    Hypotension due to hypovolemia    Problematic consumption of alcohol 12/01/2018   Tooth impaction 12/01/2018   Ataxia 12/01/2018   Dementia without behavioral disturbance ([place]) 01/05/2019   Pneumonia due to COVID-19 virus 10/14/2019   Hyponatremia 10/14/2019   Cerebellar stroke, acute ([place]) 11/22/2021   COPD (chronic obstructive pulmonary disease) ([place])    Acute CVA (cerebrovascular accident) ([place]) 11/22/2021   Resolved Ambulatory Problems    Diagnosis Date Noted   Parkinsonism 08/22/2014   New onset a-fib ([place]) 09/16/2014   Alcohol withdrawal delirium ([place]) 08/08/2015   Altered mental status 11/07/2015   Acute encephalopathy 11/07/2015   Thrombocytopenia ([place]) 11/07/2015   Acute respiratory failure with hypercapnia (HCC)    Acute respiratory failure (HCC)    Alcohol intoxication ([place])    Altered mental state    Acute respiratory failure with hypoxemia ([place])    Acute renal failure superimposed on stage 3 chronic kidney disease ([place]) 06/18/2018   Past Medical History:  Diagnosis Date   Alcoholic peripheral neuropathy ([facility])    Allergy    Arthritis    Cataract    Detached retina    EtOH dependence ([place])    Fall    Falls frequently 11/2018   Gout    Gout    Left inguinal hernia    Malnutrition (HCC)    Parkinson's disease    Prostate cancer ([place])     SOCIAL HX:  Social History   Tobacco Use   Smoking status: Former    Packs/day: 2.00    Years: 22.00    Total pack years: 44.00    Types: Cigarettes    Quit date: 11/18/1977    Years since quitting: 44.9   Smokeless tobacco: Never  Substance Use Topics   Alcohol use: No    Alcohol/week: 2.0 standard drinks of alcohol    Types: 1  Shots of liquor, 1 Standard drinks or equivalent per week    Comment: update 01/05/2019 none in past month      FAMILY HX:  Family History  Problem Relation Age of Onset   Stroke Father        died @ 79.   Heart attack Mother        died @ 71   Stroke Brother        died in his 60's   Heart attack Brother        died in his 54's   Colon cancer Neg Hx    Esophageal cancer Neg Hx    Rectal cancer Neg Hx    Stomach cancer Neg Hx    Prostate cancer Neg Hx       ALLERGIES: No Known Allergies    PERTINENT MEDICATIONS:  Outpatient Encounter Medications as of 10/18/2022  Medication Sig   acetaminophen (TYLENOL) 325 MG tablet Take 650 mg by mouth every 4 (four) hours as needed for fever.   albuterol (PROVENTIL) (2.5 MG/3ML) 0.083% nebulizer solution Take 2.5 mg by nebulization every 6 (six) hours as needed for wheezing or shortness of breath.   albuterol (VENTOLIN HFA) 108 (90 Base) MCG/ACT inhaler Inhale 2 puffs into the lungs 3 (three) times daily.   aspirin EC 81 MG  tablet Take 1 tablet (81 mg total) by mouth daily.   atorvastatin (LIPITOR) 40 MG tablet Take 1 tablet (40 mg total) by mouth daily.   BIOFREEZE 4 % GEL Apply 1 application topically 3 (three) times daily as needed (to affected sites).   cholecalciferol (VITAMIN D3) 25 MCG (1000 UT) tablet Take 1,000 Units by mouth daily.   Coal Tar 20 % SOLN Apply 1 application topically See admin instructions. Shampoo the hair when bathing (re: Hospice)   divalproex (DEPAKOTE) 125 MG DR tablet Take 125 mg by mouth at bedtime.   escitalopram (LEXAPRO) 10 MG tablet Take 10 mg by mouth at bedtime.   fludrocortisone (FLORINEF) 0.1 MG tablet Take 0.1 mg by mouth in the morning.   guaiFENesin-dextromethorphan (ROBITUSSIN DM) 100-10 MG/5ML syrup Take 10 mLs by mouth every 4 (four) hours as needed for cough.   LORazepam (ATIVAN) 0.5 MG tablet Take 0.5 tablets (0.25 mg total) by mouth in the morning and at bedtime.   Magnesium Oxide 250 MG TABS  Take 500 mg by mouth at bedtime.   miconazole (MICOTIN) 2 % cream Apply 1 application topically daily as needed (rash, jock itch).   Multiple Vitamin (DAILY-VITE MULTIVITAMIN) TABS Take 1 tablet by mouth in the morning.   OXYGEN Inhale 2 L/min into the lungs as needed (for shortness of breath).   No facility-administered encounter medications on file as of 10/18/2022.     Thank you for the opportunity to participate in the care of Victor Clarke.  The palliative care team will continue to follow. Please call our office at [phone] if we can be of additional assistance.   Note: Portions of this note were generated with Lobbyist. Dictation errors may occur despite best attempts at proofreading.  Teodoro Spray, NP

## 2022-11-19 ENCOUNTER — Non-Acute Institutional Stay: Payer: Medicare Other | Admitting: Hospice

## 2022-11-19 DIAGNOSIS — F419 Anxiety disorder, unspecified: Secondary | ICD-10-CM

## 2022-11-19 DIAGNOSIS — F03918 Unspecified dementia, unspecified severity, with other behavioral disturbance: Secondary | ICD-10-CM

## 2022-11-19 DIAGNOSIS — Z515 Encounter for palliative care: Secondary | ICD-10-CM

## 2022-11-19 DIAGNOSIS — J449 Chronic obstructive pulmonary disease, unspecified: Secondary | ICD-10-CM

## 2022-11-19 NOTE — Progress Notes (Signed)
[facility] Consult Note Telephone: [phone]  Fax: [phone]  PATIENT NAME: Victor Clarke [id number] (home)  DOB: July 15, 1929 MRN: [id number]  PRIMARY CARE PROVIDER:    Sherald Hess., MD,  [address] [place] [address] [phone]  REFERRING PROVIDER:   Sherald Hess., MD [place],  [place] [address] [phone]  RESPONSIBLE PARTY:   [place]     Name Relation Home Work Metaline Niece [phone]  [phone]   Victor Clarke Niece   [phone]        I met face to face with patient at facility. Visit to build trust and highlight Palliative Medicine as specialized medical care for people living with serious illness, aimed at facilitating better quality of life through symptoms relief, assisting with advance care planning and complex medical decision making.   ASSESSMENT AND / RECOMMENDATIONS:   CODE STATUS: Do Not Resuscitate.  Goals of Care: Goals include to maximize quality of life and symptom management. Family is interested in patient getting back to hospice service when he qualifies for it. Symptom Management/Plan: Dementia: Memory loss/confusion is ongoing.  FAST 6A. Continue ongoing supportive care.  Fall and safety precautions. COPD: No recent exacerbation.  Albuterol as needed.  Robitussin as ordered for cough. Anxiety with agitation: Continue Ativan, Depakote as ordered. Routine CBC CMP. Follow up: Palliative care will continue to follow for complex medical decision making, advance care planning, and clarification of goals. Return 6 weeks or prn. Encouraged to call provider sooner with any concerns.   Family /Caregiver/Community Supports: Patient in Assisted Living for ongoing care  HOSPICE ELIGIBILITY/DIAGNOSIS: TBD  Chief Complaint: Initial Palliative care visit  HISTORY OF PRESENT ILLNESS:   Victor Clarke is a 89 y.o. year old male  with multiple morbidities requiring close monitoring and with high risk of complications and  mortality:   History of COPD, dementia Parkinson's disease, CVA, anxiety with agitation. History obtained from review of EMR, discussion with primary team, caregiver, family and/or Victor Clarke.  Review and summarization of Epic records shows history from other than patient. Rest of 10 point ROS asked and negative. Independent interpretation of tests and reviewed as needed, available labs, patient records, imaging, studies and related documents from the EMR.  PAST MEDICAL HISTORY:  Active Ambulatory Problems    Diagnosis Date Noted   Dyspnea on exertion 09/16/2014   Hypertension 09/16/2014   Ectopic atrial tachycardia    Hypercholesteremia    High anion gap metabolic acidosis 67/10/4579   Dizziness 07/31/2015   Orthostatic hypotension 07/31/2015   Lactic acidosis 07/31/2015   Dehydration 08/08/2015   UTI (urinary tract infection) 08/08/2015   Fall from steps 11/07/2015   Anemia 11/07/2015   Alcohol dependence ([place]) 11/07/2015   Pyuria 11/07/2015   Malnutrition of moderate degree 11/15/2015   Memory loss 10/22/2016   Syncope 06/18/2018   Leukopenia 06/18/2018   AV block, Mobitz II 06/18/2018   First degree AV block    Hypotension due to hypovolemia    Problematic consumption of alcohol 12/01/2018   Tooth impaction 12/01/2018   Ataxia 12/01/2018   Dementia without behavioral disturbance ([place]) 01/05/2019   Pneumonia due to COVID-19 virus 10/14/2019   Hyponatremia 10/14/2019   Cerebellar stroke, acute ([place]) 11/22/2021   COPD (chronic obstructive pulmonary disease) ([place])    Acute CVA (cerebrovascular accident) ([place]) 11/22/2021   Resolved Ambulatory Problems  Diagnosis Date Noted   Parkinsonism 08/22/2014   New onset a-fib ([place]) 09/16/2014   Alcohol withdrawal delirium ([place]) 08/08/2015   Altered mental status 11/07/2015   Acute encephalopathy  11/07/2015   Thrombocytopenia ([place]) 11/07/2015   Acute respiratory failure with hypercapnia (HCC)    Acute respiratory failure (HCC)    Alcohol intoxication ([place])    Altered mental state    Acute respiratory failure with hypoxemia ([place])    Acute renal failure superimposed on stage 3 chronic kidney disease ([place]) 06/18/2018   Past Medical History:  Diagnosis Date   Alcoholic peripheral neuropathy ([place])    Allergy    Arthritis    Cataract    Detached retina    EtOH dependence ([place])    Fall    Falls frequently 11/2018   Gout    Gout    Left inguinal hernia    Malnutrition (HCC)    Parkinson's disease    Prostate cancer ([place])     SOCIAL HX:  Social History   Tobacco Use   Smoking status: Former    Packs/day: 2.00    Years: 22.00    Total pack years: 44.00    Types: Cigarettes    Quit date: 11/18/1977    Years since quitting: 45.0   Smokeless tobacco: Never  Substance Use Topics   Alcohol use: No    Alcohol/week: 2.0 standard drinks of alcohol    Types: 1 Shots of liquor, 1 Standard drinks or equivalent per week    Comment: update 01/05/2019 none in past month      FAMILY HX:  Family History  Problem Relation Age of Onset   Stroke Father        died @ 9.   Heart attack Mother        died @ 51   Stroke Brother        died in his 20's   Heart attack Brother        died in his 35's   Colon cancer Neg Hx    Esophageal cancer Neg Hx    Rectal cancer Neg Hx    Stomach cancer Neg Hx    Prostate cancer Neg Hx       ALLERGIES: No Known Allergies    PERTINENT MEDICATIONS:  Outpatient Encounter Medications as of 11/19/2022  Medication Sig   acetaminophen (TYLENOL) 325 MG tablet Take 650 mg by mouth every 4 (four) hours as needed for fever.   albuterol (PROVENTIL) (2.5 MG/3ML) 0.083% nebulizer solution Take 2.5 mg by nebulization every 6 (six) hours as needed for wheezing or shortness of breath.   albuterol (VENTOLIN HFA) 108 (90 Base) MCG/ACT inhaler Inhale 2 puffs  into the lungs 3 (three) times daily.   aspirin EC 81 MG tablet Take 1 tablet (81 mg total) by mouth daily.   atorvastatin (LIPITOR) 40 MG tablet Take 1 tablet (40 mg total) by mouth daily.   BIOFREEZE 4 % GEL Apply 1 application topically 3 (three) times daily as needed (to affected sites).   cholecalciferol (VITAMIN D3) 25 MCG (1000 UT) tablet Take 1,000 Units by mouth daily.   Coal Tar 20 % SOLN Apply 1 application topically See admin instructions. Shampoo the hair when bathing (re: Hospice)   divalproex (DEPAKOTE) 125 MG DR tablet Take 125 mg by mouth at bedtime.   escitalopram (LEXAPRO) 10 MG tablet Take 10 mg by mouth at bedtime.   fludrocortisone (FLORINEF) 0.1 MG tablet Take 0.1 mg by mouth in the morning.  guaiFENesin-dextromethorphan (ROBITUSSIN DM) 100-10 MG/5ML syrup Take 10 mLs by mouth every 4 (four) hours as needed for cough.   LORazepam (ATIVAN) 0.5 MG tablet Take 0.5 tablets (0.25 mg total) by mouth in the morning and at bedtime.   Magnesium Oxide 250 MG TABS Take 500 mg by mouth at bedtime.   miconazole (MICOTIN) 2 % cream Apply 1 application topically daily as needed (rash, jock itch).   Multiple Vitamin (DAILY-VITE MULTIVITAMIN) TABS Take 1 tablet by mouth in the morning.   OXYGEN Inhale 2 L/min into the lungs as needed (for shortness of breath).   No facility-administered encounter medications on file as of 11/19/2022.     Thank you for the opportunity to participate in the care of Mr. Mccaskey.  The palliative care team will continue to follow. Please call our office at [phone] if we can be of additional assistance.   Note: Portions of this note were generated with Lobbyist. Dictation errors may occur despite best attempts at proofreading.  Teodoro Spray, NP

## 2022-12-09 ENCOUNTER — Other Ambulatory Visit: Payer: Self-pay

## 2022-12-09 ENCOUNTER — Observation Stay (HOSPITAL_COMMUNITY)
Admission: EM | Admit: 2022-12-09 | Discharge: 2022-12-10 | Disposition: A | Discharge status: Skilled Nursing Facility | Payer: Medicare Other | Attending: Internal Medicine | Admitting: Internal Medicine

## 2022-12-09 ENCOUNTER — Encounter (HOSPITAL_COMMUNITY): Payer: Self-pay

## 2022-12-09 ENCOUNTER — Emergency Department (HOSPITAL_COMMUNITY): Payer: Medicare Other

## 2022-12-09 DIAGNOSIS — R0902 Hypoxemia: Secondary | ICD-10-CM

## 2022-12-09 DIAGNOSIS — R778 Other specified abnormalities of plasma proteins: Secondary | ICD-10-CM | POA: Diagnosis not present

## 2022-12-09 DIAGNOSIS — Z79899 Other long term (current) drug therapy: Secondary | ICD-10-CM | POA: Diagnosis not present

## 2022-12-09 DIAGNOSIS — Z7189 Other specified counseling: Secondary | ICD-10-CM

## 2022-12-09 DIAGNOSIS — Z515 Encounter for palliative care: Secondary | ICD-10-CM | POA: Diagnosis not present

## 2022-12-09 DIAGNOSIS — Z8546 Personal history of malignant neoplasm of prostate: Secondary | ICD-10-CM | POA: Diagnosis not present

## 2022-12-09 DIAGNOSIS — Z8673 Personal history of transient ischemic attack (TIA), and cerebral infarction without residual deficits: Secondary | ICD-10-CM | POA: Diagnosis not present

## 2022-12-09 DIAGNOSIS — R4182 Altered mental status, unspecified: Secondary | ICD-10-CM | POA: Diagnosis present

## 2022-12-09 DIAGNOSIS — Z7982 Long term (current) use of aspirin: Secondary | ICD-10-CM | POA: Diagnosis not present

## 2022-12-09 DIAGNOSIS — Z1152 Encounter for screening for COVID-19: Secondary | ICD-10-CM | POA: Insufficient documentation

## 2022-12-09 DIAGNOSIS — R918 Other nonspecific abnormal finding of lung field: Secondary | ICD-10-CM | POA: Diagnosis not present

## 2022-12-09 DIAGNOSIS — J189 Pneumonia, unspecified organism: Principal | ICD-10-CM | POA: Diagnosis present

## 2022-12-09 DIAGNOSIS — J449 Chronic obstructive pulmonary disease, unspecified: Secondary | ICD-10-CM | POA: Insufficient documentation

## 2022-12-09 DIAGNOSIS — F0390 Unspecified dementia without behavioral disturbance: Secondary | ICD-10-CM | POA: Diagnosis not present

## 2022-12-09 DIAGNOSIS — Z66 Do not resuscitate: Secondary | ICD-10-CM | POA: Diagnosis not present

## 2022-12-09 LAB — TROPONIN I (HIGH SENSITIVITY)
Troponin I (High Sensitivity): 102 ng/L
Troponin I (High Sensitivity): 320 ng/L (ref <18)
Troponin I (High Sensitivity): 685 ng/L (ref <18)
Troponin I (High Sensitivity): 576 ng/L (ref <18)

## 2022-12-09 LAB — LIPASE, BLOOD: Lipase: 28 U/L (ref 11–51)

## 2022-12-09 LAB — CBC WITH DIFFERENTIAL/PLATELET
Abs Immature Granulocytes: 0.04 10*3/uL (ref 0.00–0.07)
Basophils Absolute: 0 10*3/uL (ref 0.0–0.1)
Basophils Relative: 0 %
Eosinophils Absolute: 0 10*3/uL (ref 0.0–0.5)
Eosinophils Relative: 0 %
HCT: 40.8 % (ref 39.0–52.0)
Hemoglobin: 12.9 g/dL — ABNORMAL LOW (ref 13.0–17.0)
Immature Granulocytes: 1 %
Lymphocytes Relative: 10 %
Lymphs Abs: 0.6 10*3/uL — ABNORMAL LOW (ref 0.7–4.0)
MCH: 28 pg (ref 26.0–34.0)
MCHC: 31.6 g/dL (ref 30.0–36.0)
MCV: 88.5 fL (ref 80.0–100.0)
Monocytes Absolute: 0.2 10*3/uL (ref 0.1–1.0)
Monocytes Relative: 3 %
Neutro Abs: 4.9 10*3/uL (ref 1.7–7.7)
Neutrophils Relative %: 86 %
Platelets: 173 10*3/uL (ref 150–400)
RBC: 4.61 MIL/uL (ref 4.22–5.81)
RDW: 15 % (ref 11.5–15.5)
WBC: 5.7 10*3/uL (ref 4.0–10.5)
nRBC: 0 % (ref 0.0–0.2)

## 2022-12-09 LAB — I-STAT VENOUS BLOOD GAS, ED
Acid-Base Excess: 0 mmol/L (ref 0.0–2.0)
Bicarbonate: 23.7 mmol/L (ref 20.0–28.0)
Calcium, Ion: 1.11 mmol/L — ABNORMAL LOW (ref 1.15–1.40)
HCT: 38 % — ABNORMAL LOW (ref 39.0–52.0)
Hemoglobin: 12.9 g/dL — ABNORMAL LOW (ref 13.0–17.0)
O2 Saturation: 72 %
Potassium: 3.4 mmol/L — ABNORMAL LOW (ref 3.5–5.1)
Sodium: 141 mmol/L (ref 135–145)
TCO2: 25 mmol/L (ref 22–32)
pCO2, Ven: 34.8 mmHg — ABNORMAL LOW (ref 44–60)
pH, Ven: 7.44 — ABNORMAL HIGH (ref 7.25–7.43)
pO2, Ven: 36 mmHg (ref 32–45)

## 2022-12-09 LAB — COMPREHENSIVE METABOLIC PANEL WITH GFR
ALT: 19 U/L (ref 0–44)
AST: 30 U/L (ref 15–41)
Albumin: 3.2 g/dL — ABNORMAL LOW (ref 3.5–5.0)
Alkaline Phosphatase: 57 U/L (ref 38–126)
Anion gap: 12 (ref 5–15)
BUN: 17 mg/dL (ref 8–23)
CO2: 24 mmol/L (ref 22–32)
Calcium: 8.7 mg/dL — ABNORMAL LOW (ref 8.9–10.3)
Chloride: 104 mmol/L (ref 98–111)
Creatinine, Ser: 1.17 mg/dL (ref 0.61–1.24)
GFR, Estimated: 59 mL/min — ABNORMAL LOW
Glucose, Bld: 135 mg/dL — ABNORMAL HIGH (ref 70–99)
Potassium: 3.5 mmol/L (ref 3.5–5.1)
Sodium: 140 mmol/L (ref 135–145)
Total Bilirubin: 0.9 mg/dL (ref 0.3–1.2)
Total Protein: 6.8 g/dL (ref 6.5–8.1)

## 2022-12-09 LAB — I-STAT CHEM 8, ED
BUN: 17 mg/dL (ref 8–23)
Calcium, Ion: 0.91 mmol/L — ABNORMAL LOW (ref 1.15–1.40)
Chloride: 103 mmol/L (ref 98–111)
Creatinine, Ser: 1 mg/dL (ref 0.61–1.24)
Glucose, Bld: 130 mg/dL — ABNORMAL HIGH (ref 70–99)
HCT: 40 % (ref 39.0–52.0)
Hemoglobin: 13.6 g/dL (ref 13.0–17.0)
Potassium: 3.4 mmol/L — ABNORMAL LOW (ref 3.5–5.1)
Sodium: 141 mmol/L (ref 135–145)
TCO2: 22 mmol/L (ref 22–32)

## 2022-12-09 LAB — LACTIC ACID, PLASMA: Lactic Acid, Venous: 2.2 mmol/L (ref 0.5–1.9)

## 2022-12-09 LAB — RESP PANEL BY RT-PCR (RSV, FLU A&B, COVID)  RVPGX2
Influenza A by PCR: NEGATIVE
Influenza B by PCR: NEGATIVE
Resp Syncytial Virus by PCR: NEGATIVE
SARS Coronavirus 2 by RT PCR: NEGATIVE

## 2022-12-09 MED ORDER — DIVALPROEX SODIUM 125 MG PO DR TAB
125.0000 mg | DELAYED_RELEASE_TABLET | Freq: Every day | ORAL | Status: DC
  Administered 2022-12-09 – 2022-12-10 (×2): 125 mg via ORAL
  Filled 2022-12-09 (×2): qty 1

## 2022-12-09 MED ORDER — LACTATED RINGERS IV BOLUS
500.0000 mL | Freq: Once | INTRAVENOUS | Status: AC
  Administered 2022-12-09: 500 mL via INTRAVENOUS

## 2022-12-09 MED ORDER — ASPIRIN 81 MG PO TBEC
81.0000 mg | DELAYED_RELEASE_TABLET | Freq: Every day | ORAL | Status: DC
  Administered 2022-12-09 – 2022-12-10 (×2): 81 mg via ORAL
  Filled 2022-12-09 (×2): qty 1

## 2022-12-09 MED ORDER — SODIUM CHLORIDE 0.9 % IV SOLN: 2.0000 g | Freq: Two times a day (BID) | INTRAVENOUS | Status: DC

## 2022-12-09 MED ORDER — VANCOMYCIN HCL IN DEXTROSE 1-5 GM/200ML-% IV SOLN: 1000.0000 mg | Freq: Once | INTRAVENOUS | Status: DC

## 2022-12-09 MED ORDER — ENOXAPARIN SODIUM 40 MG/0.4ML IJ SOSY
40.0000 mg | PREFILLED_SYRINGE | INTRAMUSCULAR | Status: DC
  Administered 2022-12-10: 40 mg via SUBCUTANEOUS
  Filled 2022-12-09 (×2): qty 0.4

## 2022-12-09 MED ORDER — ONDANSETRON HCL 4 MG/2ML IJ SOLN: 4.0000 mg | Freq: Three times a day (TID) | INTRAMUSCULAR | Status: DC | PRN

## 2022-12-09 MED ORDER — SODIUM CHLORIDE 0.9 % IV SOLN
2.0000 g | Freq: Once | INTRAVENOUS | Status: AC
  Administered 2022-12-09: 2 g via INTRAVENOUS
  Filled 2022-12-09: qty 12.5

## 2022-12-09 MED ORDER — VANCOMYCIN HCL 1500 MG/300ML IV SOLN
1500.0000 mg | Freq: Once | INTRAVENOUS | Status: DC
  Administered 2022-12-09: 1500 mg via INTRAVENOUS
  Filled 2022-12-09 (×2): qty 300

## 2022-12-09 MED ORDER — SODIUM CHLORIDE 0.9 % IV SOLN
1.0000 g | INTRAVENOUS | Status: DC
  Administered 2022-12-09: 1 g via INTRAVENOUS
  Filled 2022-12-09: qty 10

## 2022-12-09 MED ORDER — ATORVASTATIN CALCIUM 10 MG PO TABS
20.0000 mg | ORAL_TABLET | Freq: Every day | ORAL | Status: DC
  Administered 2022-12-09 – 2022-12-10 (×2): 20 mg via ORAL
  Filled 2022-12-09 (×2): qty 2

## 2022-12-09 MED ORDER — FLUDROCORTISONE ACETATE 0.1 MG PO TABS
0.1000 mg | ORAL_TABLET | Freq: Every day | ORAL | Status: DC
  Administered 2022-12-10: 0.1 mg via ORAL
  Filled 2022-12-09 (×2): qty 1

## 2022-12-09 MED ORDER — ACETAMINOPHEN 650 MG RE SUPP
650.0000 mg | RECTAL | Status: AC
  Administered 2022-12-09: 650 mg via RECTAL
  Filled 2022-12-09: qty 1

## 2022-12-09 MED ORDER — ESCITALOPRAM OXALATE 10 MG PO TABS
10.0000 mg | ORAL_TABLET | Freq: Every day | ORAL | Status: DC
  Administered 2022-12-09 – 2022-12-10 (×2): 10 mg via ORAL
  Filled 2022-12-09 (×2): qty 1

## 2022-12-09 MED ORDER — SODIUM CHLORIDE 0.9 % IV BOLUS (SEPSIS): 500.0000 mL | Freq: Once | INTRAVENOUS | Status: DC

## 2022-12-09 MED ORDER — METRONIDAZOLE 500 MG/100ML IV SOLN
500.0000 mg | Freq: Once | INTRAVENOUS | Status: AC
  Administered 2022-12-09: 500 mg via INTRAVENOUS
  Filled 2022-12-09: qty 100

## 2022-12-09 MED ORDER — VANCOMYCIN HCL IN DEXTROSE 1-5 GM/200ML-% IV SOLN: 1000.0000 mg | INTRAVENOUS | Status: DC

## 2022-12-09 MED ORDER — AZITHROMYCIN 500 MG PO TABS
250.0000 mg | ORAL_TABLET | Freq: Every day | ORAL | Status: DC
  Administered 2022-12-10: 250 mg via ORAL
  Filled 2022-12-09: qty 1

## 2022-12-09 MED ORDER — ONDANSETRON HCL 4 MG/2ML IJ SOLN
4.0000 mg | Freq: Once | INTRAMUSCULAR | Status: AC
  Administered 2022-12-09: 4 mg via INTRAVENOUS
  Filled 2022-12-09: qty 2

## 2022-12-09 MED ORDER — ALBUTEROL SULFATE (2.5 MG/3ML) 0.083% IN NEBU: 3.0000 mL | INHALATION_SOLUTION | RESPIRATORY_TRACT | Status: DC | PRN

## 2022-12-09 MED ORDER — IOHEXOL 350 MG/ML SOLN
75.0000 mL | Freq: Once | INTRAVENOUS | Status: AC | PRN
  Administered 2022-12-09: 75 mL via INTRAVENOUS

## 2022-12-09 NOTE — Progress Notes (Signed)
Pharmacy Antibiotic Note  JOANNE BRANDER is a 89 y.o. male admitted on 12/09/2022 with sepsis. Pharmacy has been consulted for vancomycin and cefepime dosing. Pt is febrile with Tmax 103.4 and WBC is WNL. SCr is 1.17.   Plan: Cefepime 2g IV Q12H Vanc '1500mg'$  IV x 1 then 1g IV Q24H  F/u renal fxn, C&S, clinical status and peak/trough at SS  Height: '5\' 8"'$  (172.7 cm) Weight: 72.6 kg (160 lb) IBW/kg (Calculated) : 68.4  Temp (24hrs), Avg:103.4 F (39.7 C), Min:103.4 F (39.7 C), Max:103.4 F (39.7 C)  Recent Labs  Lab 12/09/22 0721 12/09/22 0737  WBC 5.7  --   CREATININE 1.17 1.00  LATICACIDVEN 2.2*  --     Estimated Creatinine Clearance: 46.6 mL/min (by C-G formula based on SCr of 1 mg/dL).    No Known Allergies  Antimicrobials this admission: Vanc 1/22>> Cefepime 1/22>> Flagyl x 1 1/22  Dose adjustments this admission: N/A  Microbiology results: Pending  Thank you for allowing pharmacy to be a part of this patient's care.  Deaun Rocha, Rande Lawman 12/09/2022 7:25 AM

## 2022-12-09 NOTE — Evaluation (Signed)
Clinical/Bedside Swallow Evaluation Patient Details  Name: Victor Clarke MRN: [id number] Date of Birth: [birth year]  Today's Date: 12/09/2022 Time: SLP Start Time (ACUTE ONLY): 1454 SLP Stop Time (ACUTE ONLY): 1506 SLP Time Calculation (min) (ACUTE ONLY): 12 min  Past Medical History:  Past Medical History:  Diagnosis Date   Alcoholic peripheral neuropathy (HCC)    Allergy    Anemia    Arthritis    Cataract    left eye cataract removed   COPD (chronic obstructive pulmonary disease) ([place])    pt denies 02-03-17   Dehydration    Detached retina    a. s/p surgical correction on the right.   Ectopic atrial tachycardia    a. 08/2014 Echo: EF 55-60%, mildly dil LA.   EtOH dependence ([place])    Fall    Falls frequently 11/2018   Gout    Gout    Hypercholesteremia    Hypertension    Left inguinal hernia    a. s/p repair ~ 10 yrs ago.   Malnutrition ([place])    Orthostatic hypotension    Parkinson's disease    Prostate cancer ([place])    a. s/p TURP.   Past Surgical History:  Past Surgical History:  Procedure Laterality Date   CATARACT EXTRACTION     on eye, possibly right eye   COLONOSCOPY     COLONOSCOPY W/ POLYPECTOMY  2001, 2006   HERNIA REPAIR     PROSTATE SURGERY     HPI:  Pt is a 89 y/o male who presented with vomiting and altered mental status. CT chest 1/22: Right lower lobe lung mass, highly suspicious for primary bronchogenic carcinoma. Along the cephalad aspect of the mass is airspace disease and concurrent infection or aspiration cannot be excluded. PMH: parkinson's disease, advanced dementia, prostate cancer s/p TURP, COPD. BSE 10/16/19 with functional swallow mechanism; regular texture diet and thin liquids recommended at that time.    Assessment / Plan / Recommendation  Clinical Impression  Pt was seen for bedside swallow evaluation. He was unable to provide any meaningful history regarding his swallowing, but staff at [facility] reported to this SLP that the pt  typically consumes a regular texture diet with thin liquids and does not receive SLP services for dysphagia. Oral mechanism exam was [facility] and he presented with adequate, natural dentition. His overall swallow mechanism was [facility] without signs of aspiration or symptoms of significant oral phase dysphagia. Referring MD (i.e., Dr. Collene Gobble) requested that the pt be started on a clear liquid diet due to his recent episodes of emesis; however, his diet may be advanced to regular texture solids and and thin liquids thereafter. SLP will follow pt to ensure tolerance and to assess need for further assessment. SLP Visit Diagnosis: Dysphagia, unspecified (R13.10)    Aspiration Risk  Mild aspiration risk    Diet Recommendation Regular;Thin liquid (MD has requested that a clear liquid diet be started due to emesis, but pt may be advanced to regular texture solids and thin liquids thereafter)   Liquid Administration via: Cup;Straw Medication Administration: Whole meds with liquid Supervision: Staff to assist with self feeding Compensations: Slow rate;Small sips/bites;Minimize environmental distractions Postural Changes: Seated upright at 90 degrees    Other  Recommendations Oral Care Recommendations: Oral care BID    Recommendations for follow up therapy are one component of a multi-disciplinary discharge planning process, led by the attending physician.  Recommendations may be updated based on patient status, additional functional criteria and insurance authorization.  Follow  up Recommendations        Assistance Recommended at Discharge    Functional Status Assessment Patient has not had a recent decline in their functional status  Frequency and Duration min 1 x/week  1 week       Prognosis Prognosis for Safe Diet Advancement: Good      Swallow Study   General Date of Onset: 12/09/22 HPI: Pt is a 89 y/o male who presented with vomiting and altered mental status. CT chest 1/22: Right lower lobe  lung mass, highly suspicious for primary bronchogenic carcinoma. Along the cephalad aspect of the mass is airspace disease and concurrent infection or aspiration cannot be excluded. PMH: parkinson's disease, advanced dementia, prostate cancer s/p TURP, COPD. BSE 10/16/19 with functional swallow mechanism; regular texture diet and thin liquids recommended at that time. Type of Study: Bedside Swallow Evaluation Previous Swallow Assessment: See HPI Diet Prior to this Study: NPO Temperature Spikes Noted: No Respiratory Status: Nasal cannula History of Recent Intubation: No Behavior/Cognition: Alert;Cooperative;Pleasant mood Oral Cavity Assessment: Within Functional Limits Oral Care Completed by SLP: No Oral Cavity - Dentition: Adequate natural dentition Vision: Functional for self-feeding Self-Feeding Abilities: Needs assist Patient Positioning: Upright in bed;Postural control adequate for testing Baseline Vocal Quality: Normal Volitional Cough: Cognitively unable to elicit Volitional Swallow: Unable to elicit    Oral/Motor/Sensory Function Overall Oral Motor/Sensory Function: Within functional limits   Ice Chips Ice chips: Within functional limits Presentation: Spoon   Thin Liquid Thin Liquid: Within functional limits Presentation: Straw    Nectar Thick Nectar Thick Liquid: Not tested   Honey Thick Honey Thick Liquid: Not tested   Puree Puree: Within functional limits Presentation: Spoon   Solid   Kyrian Stage I. Hardin Negus, MS, [place] Office number [phone]  Solid: Within functional limits Presentation: Self Fed      Horton Marshall 12/09/2022,3:51 PM

## 2022-12-09 NOTE — ED Notes (Signed)
Md notified about critical troponin and lactic level.

## 2022-12-09 NOTE — Consult Note (Signed)
Palliative Care Consult Note                                  Date: 12/09/2022   Patient Name: Victor Clarke  DOB: Sep 06, 1929  MRN: [id number]  Age / Sex: 89 y.o., male  PCP: Victor Clarke., MD Referring Physician: Aldine Contes, MD  Reason for Consultation: Establishing goals of care  HPI/Patient Profile: 89 y.o. male  with past medical history of advanced dementia, prostate cancer s/p TURP, COPD presenting to [facility] wiith vomiting and altered mental status. He was admitted on 12/09/2022 with community-acquired pneumonia, advanced dementia, new lung mass, COPD, and others.   Of note the patient was previously on hospice at his assisted living facility, recently graduated to palliative care.  PMT was consulted for [name] conversations.  Past Medical History:  Diagnosis Date   Alcoholic peripheral neuropathy (HCC)    Allergy    Anemia    Arthritis    Cataract    left eye cataract removed   COPD (chronic obstructive pulmonary disease) ([place])    pt denies 02-03-17   Dehydration    Detached retina    a. s/p surgical correction on the right.   Ectopic atrial tachycardia    a. 08/2014 Echo: EF 55-60%, mildly dil LA.   EtOH dependence ([place])    Fall    Falls frequently 11/2018   Gout    Gout    Hypercholesteremia    Hypertension    Left inguinal hernia    a. s/p repair ~ 10 yrs ago.   Malnutrition ([place])    Orthostatic hypotension    Parkinson's disease    Prostate cancer ([place])    a. s/p TURP.    Subjective:   This NP Victor Clarke reviewed medical records, received report from team, assessed the patient and then meet at the patient's bedside to discuss diagnosis, prognosis, GOC, EOL wishes disposition and options.  I met with the patient at the bedside. I spoke with his niece/HCPOA Victor Clarke via telephone.   Concept of Palliative Care was introduced as specialized medical care for people and their families living with  serious illness.  If focuses on providing relief from the symptoms and stress of a serious illness.  The goal is to improve quality of life for both the patient and the family. Values and goals of care important to patient and family were attempted to be elicited.  Created space and opportunity for patient  and family to explore thoughts and feelings regarding current medical situation   Natural trajectory and current clinical status were discussed. Questions and concerns addressed. Patient  encouraged to call with questions or concerns.    Patient/Family Understanding of Illness: With his advanced dementia the patient does not seem to fully understand why he is here.  He may momentarily, but then quickly forgets.  When I spoke with his niece Victor Munro she understands that he has pneumonia and has a mass on the lung concerning for cancer.  Regarding his dementia she states that when she came to visit in September he did not recognize her until she told him who she was.  The following day he did not remember that she had been there the day before.  This often happens with close friends in the area that come to visit him as well.  She states he was previously on hospice secondary to falls, breathing problems, and  advanced dementia.  This was in place at his long-term care/ALF.  However, he improved and was eating better so he graduated to palliative care also at his ALF.  Life Review: The patient is lifelong Nature conservation officer.  He is a widower and has no surviving children.  His closest relative is his niece Victor Munro who lives in [place].  He also has a niece named Victor Clarke who lives in [place].  Victor Clarke describes him as a previously very independent and private person.  It bothers him that he has a memory problem.  We discussed quality of life and she feels that given his previous independence that his quality of life is probably not as good as he would like.  Patient Values: Independence  Goals: Ongoing  discussions  Today's Discussion: The patient today initially stated he could not remember anything that happened yesterday.  He does not remember being sick, feeling bad.  He is not sure why he is in the hospital.  It was explained to him by the internal medicine team that he was admitted for pneumonia and he had some vomiting earlier.  We also pointed out that he had a fever in the emergency room.  He seemed a bit surprised by this.  I also informed him there is a suspicious spot on his lungs that could be cancer.  After the medical team left, about 10 or 15 minutes later, I asked the patient orientation questions.  He knows his name, that he is in the hospital, but does not know the time.  When asked why he is in the hospital it took quite a bit of time for him to think and he felt he was not sure why.  I reminded him that he has pneumonia and he stated "oh yeah, that is right."  He denies any current symptoms including dyspnea, nausea, vomiting, pain.  He tells me his closest relative is his niece Victor Munro.  He gave me permission to reach out to [name] and update her.  I called Victor Munro and we had further extensive discussions (in addition to what is noted above) about the patient's current status and goals moving forward.  At this point she does not think that he would want extensive workup for diagnostics related to his lung mass.  We discussed whether or not he would even tolerate that and she states that previously they wanted to do surgery for his hernia but he was not a surgical candidate.  Regardless, she does not think that he would want to go through chemotherapy, surgery, radiation even if it was an option, at his age.  We discussed that there is no plan to seek treatment that putting him through extensive workup is likely unnecessary.  She agreed.  I confirmed desire for DNR.  At this point we agreed to continue to treat the treatable such as IV fluids and antibiotics.  We discussed if he  decompensates whether or not he would want escalation.  She seems to be leaning toward no escalation of care, however cannot commit to this and we settled on notifying her of any worsening clinical status for her to make a decision at that time.  In the interim she will call her other cousin to further discuss.  Given current situation she would like to have hospice reinvolved.  I provided emotional and general support through therapeutic listening, empathy, sharing of stories, and other techniques. I answered all questions and addressed all concerns to the best of my ability.  Review  of Systems  Respiratory:  Negative for cough, chest tightness and shortness of breath.   Cardiovascular:  Negative for chest pain.  Gastrointestinal:  Negative for abdominal pain, nausea and vomiting.    Objective:   Primary Diagnoses: Present on Admission:  Community acquired pneumonia   Physical Exam Vitals and nursing note reviewed.  Constitutional:      General: He is not in acute distress.    Appearance: He is ill-appearing.  HENT:     Head: Normocephalic and atraumatic.  Cardiovascular:     Rate and Rhythm: Normal rate.  Pulmonary:     Effort: Pulmonary effort is normal. No respiratory distress.  Abdominal:     General: Abdomen is flat. Bowel sounds are normal. There is no distension.     Palpations: Abdomen is soft.     Tenderness: There is no abdominal tenderness.  Skin:    General: Skin is warm and dry.  Neurological:     Mental Status: He is alert. He is disoriented and confused.  Psychiatric:        Mood and Affect: Mood normal.        Behavior: Behavior normal.     Vital Signs:  BP 114/81 (BP Location: Right Arm)   Pulse 63   Temp 99.8 F (37.7 C) (Axillary)   Resp 20   Ht '5\' 8"'$  (1.727 m)   Wt 71.4 kg   SpO2 100%   BMI 23.93 kg/m   Palliative Assessment/Data: 30-40%    Advanced Care Planning:   Existing Vynca/ACP Documentation: DNR (completed 12/09/2018)  Primary  Decision Maker: HCPOA  Code Status/Advance Care Planning: DNR  A discussion was had today regarding advanced directives. Concepts specific to code status, artifical feeding and hydration, continued IV antibiotics and rehospitalization was had.  The difference between a aggressive medical intervention path and a palliative comfort care path for this patient at this time was had.   Decisions/Changes to ACP: None today  Assessment & Plan:   Impression: 89 year old male with chronic comorbidities and acute presentation as described above.  Essentially he is admitted for pneumonia and apparent new lung mass/cancer.  He was previously on hospice for his advanced dementia but graduated recently to palliative care.  After discussing with his HCPOA (given his lack of capacity due to significant dementia and memory problems) we decided to treat the treatable, notify Victor Munro for any deep compensation to discuss whether or not escalation is desired, reinvolve hospice for when he is ready for discharge from the hospital.  Overall long-term prognosis is poor.  SUMMARY OF RECOMMENDATIONS   Confirmed DNR Treated treatable Notify Victor Munro [facility] POA/niece) of any clinical change to discuss escalation TOC consult for hospice reinvolvement Allow time for outcomes PMT will continue to follow  Symptom Management:  Per primary team PMT is available to assist as needed  Prognosis:  Unable to determine  Discharge Planning:  To Be Determined   Discussed with: Patient, patient's family/HCPOA, medical team, nursing team, [facility] team    Thank you for allowing [place] to participate in the care of [name] PMT will continue to support holistically.  Time Total: 95 min  Greater than 50%  of this time was spent counseling and coordinating care related to the above assessment and plan.  Signed by: Victor Field, NP Palliative Medicine Team  Team Phone # [phone] (Nights/Weekends)  12/09/2022, 3:00 PM

## 2022-12-09 NOTE — ED Notes (Signed)
Pt had one episode of emesis

## 2022-12-09 NOTE — ED Notes (Signed)
ED TO INPATIENT HANDOFF REPORT  ED Nurse Name and Phone #: [phone]  S Name/Age/Gender Victor Clarke 89 y.o. male Room/Bed: 045C/045C  Code Status   Code Status: DNR  Home/SNF/Other Nursing Home Patient oriented to: self Is this baseline? No   Triage Complete: Triage complete  Chief Complaint Community acquired pneumonia [J18.9]  Triage Note Pt BIBGEMS coming from [place] home. Per facility patient is not acting at baseline, Pt normally speaking in full complete sentences with a hx of dementia. Pt found covered in dry emesis by workers and called EMS. Pt not responding to questions at this time.    Allergies No Known Allergies  Level of Care/Admitting Diagnosis ED Disposition     ED Disposition  Admit   Condition  --   Comment  Hospital Area: [facility] [[id number]]  Level of Care: Med-Surg [16]  May place patient in observation at [facility] or [place] if equivalent level of care is available:: No  Covid Evaluation: Asymptomatic - no recent exposure (last 10 days) testing not required  Diagnosis: Community acquired pneumonia [510258]  Admitting Physician: Aldine Contes [phone]  Attending Physician: Aldine Contes [phone]          B Medical/Surgery History Past Medical History:  Diagnosis Date   Alcoholic peripheral neuropathy ([place])    Allergy    Anemia    Arthritis    Cataract    left eye cataract removed   COPD (chronic obstructive pulmonary disease) ([place])    pt denies 02-03-17   Dehydration    Detached retina    a. s/p surgical correction on the right.   Ectopic atrial tachycardia    a. 08/2014 Echo: EF 55-60%, mildly dil LA.   EtOH dependence ([place])    Fall    Falls frequently 11/2018   Gout    Gout    Hypercholesteremia    Hypertension    Left inguinal hernia    a. s/p repair ~ 10 yrs ago.   Malnutrition ([place])    Orthostatic hypotension    Parkinson's disease    Prostate cancer ([place])    a. s/p  TURP.   Past Surgical History:  Procedure Laterality Date   CATARACT EXTRACTION     on eye, possibly right eye   COLONOSCOPY     COLONOSCOPY W/ POLYPECTOMY  2001, 2006   [place]       A IV Location/Drains/Wounds Patient Lines/Drains/Airways Status     Active Line/Drains/Airways     Name Placement date Placement time Site Days   Peripheral IV 12/09/22 20 G Anterior;Proximal;Right Forearm 12/09/22  0725  Forearm  less than 1   Peripheral IV 12/09/22 20 G Left Antecubital 12/09/22  0730  Antecubital  less than 1   External Urinary Catheter 12/09/22  0914  --  less than 1            Intake/Output Last 24 hours  Intake/Output Summary (Last 24 hours) at 12/09/2022 1316 Last data filed at 12/09/2022 4315 Gross per 24 hour  Intake 70.31 ml  Output --  Net 70.31 ml    Labs/Imaging Results for orders placed or performed during the hospital encounter of 12/09/22 (from the past 48 hour(s))  Comprehensive metabolic panel     Status: Abnormal   Collection Time: 12/09/22  7:21 AM  Result Value Ref Range   Sodium 140 135 - 145 mmol/L   Potassium 3.5 3.5 - 5.1 mmol/L  Chloride 104 98 - 111 mmol/L   CO2 24 22 - 32 mmol/L   Glucose, Bld 135 (H) 70 - 99 mg/dL    Comment: Glucose reference range applies only to samples taken after fasting for at least 8 hours.   BUN 17 8 - 23 mg/dL   Creatinine, Ser 1.17 0.61 - 1.24 mg/dL   Calcium 8.7 (L) 8.9 - 10.3 mg/dL   Total Protein 6.8 6.5 - 8.1 g/dL   Albumin 3.2 (L) 3.5 - 5.0 g/dL   AST 30 15 - 41 U/L   ALT 19 0 - 44 U/L   Alkaline Phosphatase 57 38 - 126 U/L   Total Bilirubin 0.9 0.3 - 1.2 mg/dL   GFR, Estimated 59 (L) >60 mL/min    Comment: (NOTE) Calculated using the CKD-EPI Creatinine Equation (2021)    Anion gap 12 5 - 15    Comment: Performed at [facility] [address] [place], [place] [address]  Lipase, blood     Status: None   Collection Time: 12/09/22  7:21 AM  Result Value Ref  Range   Lipase 28 11 - 51 U/L    Comment: Performed at [facility], [place] [address] [place], [place] [address]  CBC with Diff     Status: Abnormal   Collection Time: 12/09/22  7:21 AM  Result Value Ref Range   WBC 5.7 4.0 - 10.5 K/uL   RBC 4.61 4.22 - 5.81 MIL/uL   Hemoglobin 12.9 (L) 13.0 - 17.0 g/dL   HCT 40.8 39.0 - 52.0 %   MCV 88.5 80.0 - 100.0 fL   MCH 28.0 26.0 - 34.0 pg   MCHC 31.6 30.0 - 36.0 g/dL   RDW 15.0 11.5 - 15.5 %   Platelets 173 150 - 400 K/uL   nRBC 0.0 0.0 - 0.2 %   Neutrophils Relative % 86 %   Neutro Abs 4.9 1.7 - 7.7 K/uL   Lymphocytes Relative 10 %   Lymphs Abs 0.6 (L) 0.7 - 4.0 K/uL   Monocytes Relative 3 %   Monocytes Absolute 0.2 0.1 - 1.0 K/uL   Eosinophils Relative 0 %   Eosinophils Absolute 0.0 0.0 - 0.5 K/uL   Basophils Relative 0 %   Basophils Absolute 0.0 0.0 - 0.1 K/uL   Immature Granulocytes 1 %   Abs Immature Granulocytes 0.04 0.00 - 0.07 K/uL    Comment: Performed at [facility] [address] [place], [place] [address]  Lactic acid, plasma     Status: Abnormal   Collection Time: 12/09/22  7:21 AM  Result Value Ref Range   Lactic Acid, Venous 2.2 (HH) 0.5 - 1.9 mmol/L    Comment: CRITICAL RESULT CALLED TO, READ BACK BY AND VERIFIED WITH E. ANELLO RN 12/09/22 '@0811'$  BY J. WHITE Performed at [facility] [address], [place],  [address]   Troponin I (High Sensitivity)     Status: Abnormal   Collection Time: 12/09/22  7:21 AM  Result Value Ref Range   Troponin I (High Sensitivity) 102 (HH) <18 ng/L    Comment: CRITICAL RESULT CALLED TO, READ BACK BY AND VERIFIED WITH E. ANELLO RN 12/09/22 '@0816'$  BY J. WHITE (NOTE) Elevated high sensitivity troponin I (hsTnI) values and significant  changes across serial measurements may suggest ACS but many other  chronic and acute conditions are known to elevate hsTnI results.  Refer to the "Links" section for chest pain algorithms and additional  guidance. Performed at  [facility], [place] [address], [place], [place] [address]   Resp panel by RT-PCR (RSV, Flu A&B, Covid)     Status: None   Collection Time: 12/09/22  7:23 AM   Specimen: Nasal Swab  Result Value Ref Range   SARS Coronavirus 2 by RT PCR NEGATIVE NEGATIVE    Comment: (NOTE) SARS-CoV-2 target nucleic acids are NOT DETECTED.  The SARS-CoV-2 RNA is generally detectable in upper respiratory specimens during the acute phase of infection. The lowest concentration of SARS-CoV-2 viral copies this assay can detect is 138 copies/mL. A negative result does not preclude SARS-Cov-2 infection and should not be used as the sole basis for treatment or other patient management decisions. A negative result may occur with  improper specimen collection/handling, submission of specimen other than nasopharyngeal swab, presence of viral mutation(s) within the areas targeted by this assay, and inadequate number of viral copies(<138 copies/mL). A negative result must be combined with clinical observations, patient history, and epidemiological information. The expected result is Negative.  Fact Sheet for Patients:  EntrepreneurPulse.com.au  Fact Sheet for Healthcare Providers:  IncredibleEmployment.be  This test is no t yet approved or cleared by the [place] FDA and  has been authorized for detection and/or diagnosis of SARS-CoV-2 by FDA under an Emergency Use Authorization (EUA). This EUA will remain  in effect (meaning this test can be used) for the duration of the COVID-19 declaration under Section 564(b)(1) of the Act, 21 U.S.C.section 360bbb-3(b)(1), unless the authorization is terminated  or revoked sooner.       Influenza A by PCR NEGATIVE NEGATIVE   Influenza B by PCR NEGATIVE NEGATIVE    Comment: (NOTE) The Xpert Xpress SARS-CoV-2/FLU/RSV plus assay is intended as an aid in the diagnosis of influenza from Nasopharyngeal swab specimens and should  not be used as a sole basis for treatment. Nasal washings and aspirates are unacceptable for Xpert Xpress SARS-CoV-2/FLU/RSV testing.  Fact Sheet for Patients: EntrepreneurPulse.com.au  Fact Sheet for Healthcare Providers: IncredibleEmployment.be  This test is not yet approved or cleared by the [place] FDA and has been authorized for detection and/or diagnosis of SARS-CoV-2 by FDA under an Emergency Use Authorization (EUA). This EUA will remain in effect (meaning this test can be used) for the duration of the COVID-19 declaration under Section 564(b)(1) of the Act, 21 U.S.C. section 360bbb-3(b)(1), unless the authorization is terminated or revoked.     Resp Syncytial Virus by PCR NEGATIVE NEGATIVE    Comment: (NOTE) Fact Sheet for Patients: EntrepreneurPulse.com.au  Fact Sheet for Healthcare Providers: IncredibleEmployment.be  This test is not yet approved or cleared by the [place] FDA and has been authorized for detection and/or diagnosis of SARS-CoV-2 by FDA under an Emergency Use Authorization (EUA). This EUA will remain in effect (meaning this test can be used) for the duration of the COVID-19 declaration under Section 564(b)(1) of the Act, 21 U.S.C. section 360bbb-3(b)(1), unless the authorization is terminated or revoked.  Performed at [facility], [place] [address], [place], [place] [address]   I-Stat Chem 8, ED     Status: Abnormal   Collection Time: 12/09/22  7:37 AM  Result Value Ref Range   Sodium 141 135 - 145 mmol/L   Potassium 3.4 (L) 3.5 - 5.1 mmol/L   Chloride 103 98 - 111 mmol/L   BUN 17 8 - 23 mg/dL   Creatinine, Ser 1.00 0.61 - 1.24 mg/dL   Glucose, Bld 130 (H) 70 -  99 mg/dL    Comment: Glucose reference range applies only to samples taken after fasting for at least 8 hours.   Calcium, Ion 0.91 (L) 1.15 - 1.40 mmol/L   TCO2 22 22 - 32 mmol/L   Hemoglobin 13.6 13.0  - 17.0 g/dL   HCT 40.0 39.0 - 52.0 %  I-Stat venous blood gas, ED     Status: Abnormal   Collection Time: 12/09/22  7:37 AM  Result Value Ref Range   pH, Ven 7.440 (H) 7.25 - 7.43   pCO2, Ven 34.8 (L) 44 - 60 mmHg   pO2, Ven 36 32 - 45 mmHg   Bicarbonate 23.7 20.0 - 28.0 mmol/L   TCO2 25 22 - 32 mmol/L   O2 Saturation 72 %   Acid-Base Excess 0.0 0.0 - 2.0 mmol/L   Sodium 141 135 - 145 mmol/L   Potassium 3.4 (L) 3.5 - 5.1 mmol/L   Calcium, Ion 1.11 (L) 1.15 - 1.40 mmol/L   HCT 38.0 (L) 39.0 - 52.0 %   Hemoglobin 12.9 (L) 13.0 - 17.0 g/dL   Sample type VENOUS    Comment NOTIFIED PHYSICIAN   Troponin I (High Sensitivity)     Status: Abnormal   Collection Time: 12/09/22  9:30 AM  Result Value Ref Range   Troponin I (High Sensitivity) 320 (HH) <18 ng/L    Comment: CRITICAL VALUE NOTED. VALUE IS CONSISTENT WITH PREVIOUSLY REPORTED/CALLED VALUE DELTA CHECK NOTED (NOTE) Elevated high sensitivity troponin I (hsTnI) values and significant  changes across serial measurements may suggest ACS but many other  chronic and acute conditions are known to elevate hsTnI results.  Refer to the "Links" section for chest pain algorithms and additional  guidance. Performed at [facility], [place] [address] [place], [place] [address]   Troponin I (High Sensitivity)     Status: Abnormal   Collection Time: 12/09/22 11:16 AM  Result Value Ref Range   Troponin I (High Sensitivity) 685 (HH) <18 ng/L    Comment: CRITICAL VALUE NOTED. VALUE IS CONSISTENT WITH PREVIOUSLY REPORTED/CALLED VALUE DELTA CHECK NOTED (NOTE) Elevated high sensitivity troponin I (hsTnI) values and significant  changes across serial measurements may suggest ACS but many other  chronic and acute conditions are known to elevate hsTnI results.  Refer to the "Links" section for chest pain algorithms and additional  guidance. Performed at [facility], [place] [address], [place], [place] [address]    CT CHEST ABDOMEN  PELVIS W CONTRAST  Result Date: 12/09/2022 CLINICAL DATA:  Mental status changes. Pneumonia. Dementia. Known COPD. * Tracking Code: BO * EXAM: CT CHEST, ABDOMEN, AND PELVIS WITH CONTRAST TECHNIQUE: Multidetector CT imaging of the chest, abdomen and pelvis was performed following the standard protocol during bolus administration of intravenous contrast. RADIATION DOSE REDUCTION: This exam was performed according to the departmental dose-optimization program which includes automated exposure control, adjustment of the mA and/or kV according to patient size and/or use of iterative reconstruction technique. CONTRAST:  89m OMNIPAQUE IOHEXOL 350 MG/ML SOLN COMPARISON:  Plain film of earlier today.  05/01/2018 CTA chest FINDINGS: CT CHEST FINDINGS Cardiovascular: Mild motion degradation. Exam also degraded by patient arm position, not raised above the head. Aortic atherosclerosis. Tortuous thoracic aorta. Moderate cardiomegaly, without pericardial effusion. Three vessel coronary artery calcification. No central pulmonary embolism, on this non-dedicated study. Pulmonary artery enlargement, including a 3.2 cm right pulmonary artery. Mediastinum/Nodes: 11 mm node within the azygoesophageal recess on 34/3. No hilar adenopathy. Tiny hiatal hernia. Lungs/Pleura: Trace  right pleural fluid. Advanced centrilobular and paraseptal emphysema. Basilar predominant interstitial thickening is similar to 2019. Right lower lobe lung mass measures 4.9 x 4.7 cm on 100/5. Along the superior aspect of this mass is consolidation with air bronchograms on 80/5. Musculoskeletal: No acute osseous abnormality. CT ABDOMEN PELVIS FINDINGS Hepatobiliary: Normal liver. Normal gallbladder, without biliary ductal dilatation. Pancreas: Normal, without mass or ductal dilatation. Spleen: Normal in size, without focal abnormality. Adrenals/Urinary Tract: Normal adrenal glands. Mild renal cortical thinning bilaterally. Bilateral too small to characterize  renal lesions. A dominant central inter/lower pole left renal cyst measures 8.3 x 7.7 cm . In the absence of clinically indicated signs/symptoms require(s) no independent follow-up. No hydronephrosis. Normal urinary bladder. Stomach/Bowel: Normal remainder of the stomach. Normal colon, appendix, and terminal ileum. Nonobstructive small bowel enters a moderate-sized right inguinal hernia including on 136/3. Vascular/Lymphatic: Aortic atherosclerosis. No abdominopelvic adenopathy. Prior pelvic node dissection. Reproductive: Prostatectomy. Other: No significant free fluid. Musculoskeletal: No acute osseous abnormality. IMPRESSION: 1. Motion and patient arm position degraded exam. 2. Right lower lobe lung mass, highly suspicious for primary bronchogenic carcinoma. Along the cephalad aspect of the mass is airspace disease and concurrent infection or aspiration cannot be excluded. 3. Isolated mild lower mediastinal adenopathy could be reactive or represent nodal metastasis. 4. No acute process or evidence of metastatic disease in the abdomen or pelvis. 5. Nonobstructive small bowel containing right inguinal hernia. 6. Pulmonary artery enlargement suggests pulmonary arterial hypertension. 7. Aortic atherosclerosis (ICD10-I70.0), coronary artery atherosclerosis and emphysema (ICD10-J43.9). These results were called by telephone at the time of interpretation on 12/09/2022 at 9:16 am to provider Margaretmary Eddy , who verbally acknowledged these results. Electronically Signed   By: Abigail Miyamoto M.D.   On: 12/09/2022 09:16   CT Head Wo Contrast  Result Date: 12/09/2022 CLINICAL DATA:  Mental status change, unknown cause. EXAM: CT HEAD WITHOUT CONTRAST TECHNIQUE: Contiguous axial images were obtained from the base of the skull through the vertex without intravenous contrast. RADIATION DOSE REDUCTION: This exam was performed according to the departmental dose-optimization program which includes automated exposure control,  adjustment of the mA and/or kV according to patient size and/or use of iterative reconstruction technique. COMPARISON:  Head CT 10/04/2022. FINDINGS: Brain: No acute hemorrhage. Unchanged severe chronic small-vessel disease. 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 Cortical gray-white differentiation is otherwise preserved. Stable central pattern of volume loss. No extra-axial collection. Basilar cisterns are patent. Vascular: Unchanged atherosclerotic calcifications of the bilateral MCAs and basilar artery. Skull: No calvarial fracture or suspicious bone lesion. Skull base is unremarkable within limitations of mild motion artifact. Sinuses/Orbits: Paranasal sinuses, mastoid air cells, and middle ear cavities are well aerated. Orbits are unremarkable. Other: No scalp hematoma. IMPRESSION: 1. No acute intracranial abnormalities. 2. 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 Electronically Signed   By: Emmit Alexanders M.D.   On: 12/09/2022 09:00   DG Chest Portable 1 View  Result Date: 12/09/2022 CLINICAL DATA:  89 year old male with history of aspiration. Altered mental status. EXAM: PORTABLE CHEST 1 VIEW COMPARISON:  Chest x-ray 10/04/2022. FINDINGS: Lung volumes are low. Extensive interstitial prominence throughout the mid to lower lungs, suggesting a background of interstitial lung disease. There are also emphysematous changes in the upper lungs. Bibasilar opacities which may reflect areas of atelectasis and/or consolidation. Small bilateral pleural effusions. Mild cardiomegaly. The patient is rotated to the right on today's exam, resulting in distortion of the mediastinal contours and reduced diagnostic sensitivity  and specificity for mediastinal pathology. Atherosclerotic calcifications are noted in the thoracic aorta. IMPRESSION: 1. Low lung volumes with bibasilar areas of atelectasis and/or consolidation and superimposed small bilateral  pleural effusions. 2. The appearance of the lungs is similar to prior studies, likely reflecting a background of emphysema and chronic interstitial lung disease. 3. Aortic atherosclerosis. 4. Mild cardiomegaly. Electronically Signed   By: Vinnie Langton M.D.   On: 12/09/2022 07:48    Pending Labs Unresulted Labs (From admission, onward)     Start     Ordered   12/09/22 0726  Blood gas, venous  Once,   R        12/09/22 0725   12/09/22 0724  Blood culture (routine x 2)  BLOOD CULTURE X 2,   R      12/09/22 0723            Vitals/Pain Today's Vitals   12/09/22 1100 12/09/22 1145 12/09/22 1146 12/09/22 1200  BP: 119/64 129/67  122/60  Pulse: 74 77  74  Resp: (!) 23 20  (!) 22  Temp:   99.8 F (37.7 C)   TempSrc:   Axillary   SpO2: 97% 95%  91%  Weight:      Height:        Isolation Precautions Airborne and Contact precautions  Medications Medications  enoxaparin (LOVENOX) injection 40 mg (0 mg Subcutaneous Hold 12/09/22 1042)  lactated ringers bolus 500 mL (has no administration in time range)  ondansetron (ZOFRAN) injection 4 mg (has no administration in time range)  cefTRIAXone (ROCEPHIN) 1 g in sodium chloride 0.9 % 100 mL IVPB (has no administration in time range)  azithromycin (ZITHROMAX) tablet 250 mg (has no administration in time range)  aspirin EC tablet 81 mg (has no administration in time range)  atorvastatin (LIPITOR) tablet 20 mg (has no administration in time range)  escitalopram (LEXAPRO) tablet 10 mg (has no administration in time range)  albuterol (PROVENTIL) (2.5 MG/3ML) 0.083% nebulizer solution 3 mL (has no administration in time range)  divalproex (DEPAKOTE) DR tablet 125 mg (has no administration in time range)  fludrocortisone (FLORINEF) tablet 0.1 mg (has no administration in time range)  ondansetron (ZOFRAN) injection 4 mg (4 mg Intravenous Given 12/09/22 0735)  ceFEPIme (MAXIPIME) 2 g in sodium chloride 0.9 % 100 mL IVPB (0 g Intravenous Stopped  12/09/22 0914)  metroNIDAZOLE (FLAGYL) IVPB 500 mg (0 mg Intravenous Stopped 12/09/22 0953)  acetaminophen (TYLENOL) suppository 650 mg (650 mg Rectal Given 12/09/22 0914)  iohexol (OMNIPAQUE) 350 MG/ML injection 75 mL (75 mLs Intravenous Contrast Given 12/09/22 0849)    Mobility Have not assessed how he walks      Focused Assessments    R Recommendations: See Admitting Provider Note  Report given to:   Additional Notes:

## 2022-12-09 NOTE — ED Notes (Signed)
Internalist at bedside

## 2022-12-09 NOTE — ED Notes (Signed)
Help get patient cleaned up placed a clean sheet and a pad placed a external cath patient is resting with call bell in reach

## 2022-12-09 NOTE — ED Triage Notes (Signed)
Pt BIBGEMS coming from [facility] home. Per facility patient is not acting at baseline, Pt normally speaking in full complete sentences with a hx of dementia. Pt found covered in dry emesis by workers and called EMS. Pt not responding to questions at this time.

## 2022-12-09 NOTE — ED Provider Notes (Signed)
[facility] Provider Note   CSN: [id number] Arrival date & time: 12/09/22  1856     History  Chief Complaint  Patient presents with   Emesis   Altered Mental Status    Victor Clarke is a 89 y.o. male.  89 yo M with hx of dementia, parkinson's, HTN, HLD, prostate cancer sp TURP, and COPD who presents with vomiting and altered mental status.  Patient was transported [facility].  Patient unable to give additional history due to his dementia.  Unclear what his baseline is but per staff he started experiencing vomiting and was complaining of pain.  Is alert and oriented to self only at this time.   Per staff has been very confused.  Typically alert and oriented only to self but can hold a conversation.  Less conversational recently.  Didn't eat last night. Went to give him his medicine this morning and he threw up everywhere. Has been having stomach pains since over the weekend. Vomiting started Thursday or Friday of last week. No diarrhea. Typically is able to dress himself. Someone else manages his medications.        Home Medications Prior to Admission medications   Medication Sig Start Date End Date Taking? Authorizing Provider  acetaminophen (TYLENOL) 325 MG tablet Take 650 mg by mouth every 4 (four) hours as needed for fever.   Yes [provider]  aspirin EC 81 MG tablet Take 1 tablet (81 mg total) by mouth daily. 11/26/21  Yes Patrecia Pour, MD  atorvastatin (LIPITOR) 40 MG tablet Take 1 tablet (40 mg total) by mouth daily. Patient taking differently: Take 20 mg by mouth daily. 11/26/21  Yes Patrecia Pour, MD  BIOFREEZE 4 % GEL Apply 1 application topically 3 (three) times daily as needed (to affected sites).   Yes [provider]  cholecalciferol (VITAMIN D3) 25 MCG (1000 UT) tablet Take 1,000 Units by mouth daily.   Yes [provider]  divalproex (DEPAKOTE) 125 MG DR tablet  Take 125 mg by mouth at bedtime.   Yes [provider]  Ensure (ENSURE) Take 237 mLs by mouth 3 (three) times daily between meals.   Yes [provider]  escitalopram (LEXAPRO) 10 MG tablet Take 10 mg by mouth at bedtime.   Yes [provider]  fludrocortisone (FLORINEF) 0.1 MG tablet Take 0.1 mg by mouth in the morning. 11/02/21  Yes [provider]  guaiFENesin-dextromethorphan (ROBITUSSIN DM) 100-10 MG/5ML syrup Take 10 mLs by mouth every 4 (four) hours as needed for cough. 10/18/19  Yes Shah, Pratik D, DO  LORazepam (ATIVAN) 0.5 MG tablet Take 0.5 tablets (0.25 mg total) by mouth in the morning and at bedtime. 11/26/21  Yes Patrecia Pour, MD  Magnesium Oxide 250 MG TABS Take 500 mg by mouth at bedtime.   Yes [provider]  Multiple Vitamin (DAILY-VITE MULTIVITAMIN) TABS Take 1 tablet by mouth in the morning.   Yes [provider]  albuterol (PROVENTIL) (2.5 MG/3ML) 0.083% nebulizer solution Take 2.5 mg by nebulization every 6 (six) hours as needed for wheezing or shortness of breath. Patient not taking: Reported on 12/09/2022    [provider]  albuterol (VENTOLIN HFA) 108 (90 Base) MCG/ACT inhaler Inhale 2 puffs into the lungs 3 (three) times daily. Patient not taking: Reported on 12/09/2022 10/18/19   Heath Lark D, DO  Coal Tar 20 % SOLN Apply 1 application topically See  admin instructions. Shampoo the hair when bathing (re: Hospice) Patient not taking: Reported on 12/09/2022    [provider]  miconazole (MICOTIN) 2 % cream Apply 1 application topically daily as needed (rash, jock itch). Patient not taking: Reported on 12/09/2022    [provider]  ondansetron (ZOFRAN-ODT) 8 MG disintegrating tablet Take 8 mg by mouth every 8 (eight) hours. Patient not taking: Reported on 12/09/2022 11/26/22   [provider]  OXYGEN Inhale 2 L/min into the lungs as needed (for shortness of breath).    [provider]      Allergies    Patient has no known allergies.    Review of Systems   Review of Systems  Physical Exam Updated Vital Signs BP 114/81 (BP Location: Right Arm)   Pulse 63   Temp 99.8 F (37.7 C) (Axillary)   Resp 20   Ht '5\' 8"'$  (1.727 m)   Wt 71.4 kg   SpO2 100%   BMI 23.93 kg/m  Physical Exam Vitals and nursing note reviewed.  Constitutional:      Appearance: He is well-developed. He is ill-appearing. He is not toxic-appearing.     Comments: Vomiting nonbloody nonbilious emesis during exam  HENT:     Head: Normocephalic and atraumatic.     Right Ear: External ear normal.     Left Ear: External ear normal.     Nose: Nose normal.  Eyes:     Extraocular Movements: Extraocular movements intact.     Conjunctiva/sclera: Conjunctivae normal.     Pupils: Pupils are equal, round, and reactive to light.  Cardiovascular:     Rate and Rhythm: Normal rate and regular rhythm.     Heart sounds: Normal heart sounds.  Pulmonary:     Effort: No respiratory distress.     Breath sounds: Normal breath sounds.     Comments: Tachypnea present.  On 6 L nasal cannula Abdominal:     General: There is no distension.     Palpations: Abdomen is soft. There is no mass.     Tenderness: There is no abdominal tenderness. There is no guarding.     Hernia: A hernia (Right lower quadrant.  Reducible.) is present.  Genitourinary:    Comments: No sacral ulcers or wounds to his back noted. Musculoskeletal:     Cervical back: Normal range of motion and neck supple.  Skin:    General: Skin is warm and dry.  Neurological:     General: No focal deficit present.     Mental Status: He is alert.     Comments: Unclear baseline.  Oriented to self only.  Psychiatric:        Mood and Affect: Mood normal.        Behavior: Behavior normal.     ED Results / Procedures / Treatments   Labs (all labs ordered are listed, but only abnormal results are displayed) Labs Reviewed  COMPREHENSIVE  METABOLIC PANEL - Abnormal; Notable for the following components:      Result Value   Glucose, Bld 135 (*)    Calcium 8.7 (*)    Albumin 3.2 (*)    GFR, Estimated 59 (*)    All other components within normal limits  CBC WITH DIFFERENTIAL/PLATELET - Abnormal; Notable for the following components:   Hemoglobin 12.9 (*)    Lymphs Abs 0.6 (*)    All other components within normal limits  LACTIC ACID, PLASMA - Abnormal; Notable for the following components:  Lactic Acid, Venous 2.2 (*)    All other components within normal limits  I-STAT CHEM 8, ED - Abnormal; Notable for the following components:   Potassium 3.4 (*)    Glucose, Bld 130 (*)    Calcium, Ion 0.91 (*)    All other components within normal limits  I-STAT VENOUS BLOOD GAS, ED - Abnormal; Notable for the following components:   pH, Ven 7.440 (*)    pCO2, Ven 34.8 (*)    Potassium 3.4 (*)    Calcium, Ion 1.11 (*)    HCT 38.0 (*)    Hemoglobin 12.9 (*)    All other components within normal limits  TROPONIN I (HIGH SENSITIVITY) - Abnormal; Notable for the following components:   Troponin I (High Sensitivity) 102 (*)    All other components within normal limits  TROPONIN I (HIGH SENSITIVITY) - Abnormal; Notable for the following components:   Troponin I (High Sensitivity) 320 (*)    All other components within normal limits  TROPONIN I (HIGH SENSITIVITY) - Abnormal; Notable for the following components:   Troponin I (High Sensitivity) 685 (*)    All other components within normal limits  TROPONIN I (HIGH SENSITIVITY) - Abnormal; Notable for the following components:   Troponin I (High Sensitivity) 576 (*)    All other components within normal limits  RESP PANEL BY RT-PCR (RSV, FLU A&B, COVID)  [id number]  CULTURE, BLOOD (ROUTINE X 2)  CULTURE, BLOOD (ROUTINE X 2)  LIPASE, BLOOD  BASIC METABOLIC PANEL  CBC    EKG EKG Interpretation  Date/Time:  Monday December 09 2022 07:25:57 EST Ventricular Rate:  87 PR  Interval:  257 QRS Duration: 106 QT Interval:  381 QTC Calculation: 459 R Axis:   266 Text Interpretation: Sinus or ectopic atrial rhythm Prolonged PR interval Consider left atrial enlargement Incomplete RBBB and LAFB Probable right ventricular hypertrophy No significant change since last tracing Confirmed by Margaretmary Eddy [phone]) on 12/09/2022 7:36:50 AM  Radiology CT CHEST ABDOMEN PELVIS W CONTRAST  Result Date: 12/09/2022 CLINICAL DATA:  Mental status changes. Pneumonia. Dementia. Known COPD. * Tracking Code: BO * EXAM: CT CHEST, ABDOMEN, AND PELVIS WITH CONTRAST TECHNIQUE: Multidetector CT imaging of the chest, abdomen and pelvis was performed following the standard protocol during bolus administration of intravenous contrast. RADIATION DOSE REDUCTION: This exam was performed according to the departmental dose-optimization program which includes automated exposure control, adjustment of the mA and/or kV according to patient size and/or use of iterative reconstruction technique. CONTRAST:  13m OMNIPAQUE IOHEXOL 350 MG/ML SOLN COMPARISON:  Plain film of earlier today.  05/01/2018 CTA chest FINDINGS: CT CHEST FINDINGS Cardiovascular: Mild motion degradation. Exam also degraded by patient arm position, not raised above the head. Aortic atherosclerosis. Tortuous thoracic aorta. Moderate cardiomegaly, without pericardial effusion. Three vessel coronary artery calcification. No central pulmonary embolism, on this non-dedicated study. Pulmonary artery enlargement, including a 3.2 cm right pulmonary artery. Mediastinum/Nodes: 11 mm node within the azygoesophageal recess on 34/3. No hilar adenopathy. Tiny hiatal hernia. Lungs/Pleura: Trace right pleural fluid. Advanced centrilobular and paraseptal emphysema. Basilar predominant interstitial thickening is similar to 2019. Right lower lobe lung mass measures 4.9 x 4.7 cm on 100/5. Along the superior aspect of this mass is consolidation with air bronchograms on  80/5. Musculoskeletal: No acute osseous abnormality. CT ABDOMEN PELVIS FINDINGS Hepatobiliary: Normal liver. Normal gallbladder, without biliary ductal dilatation. Pancreas: Normal, without mass or ductal dilatation. Spleen: Normal in size, without focal abnormality. Adrenals/Urinary Tract: Normal adrenal glands. Mild  renal cortical thinning bilaterally. Bilateral too small to characterize renal lesions. A dominant central inter/lower pole left renal cyst measures 8.3 x 7.7 cm . In the absence of clinically indicated signs/symptoms require(s) no independent follow-up. No hydronephrosis. Normal urinary bladder. Stomach/Bowel: Normal remainder of the stomach. Normal colon, appendix, and terminal ileum. Nonobstructive small bowel enters a moderate-sized right inguinal hernia including on 136/3. Vascular/Lymphatic: Aortic atherosclerosis. No abdominopelvic adenopathy. Prior pelvic node dissection. Reproductive: Prostatectomy. Other: No significant free fluid. Musculoskeletal: No acute osseous abnormality. IMPRESSION: 1. Motion and patient arm position degraded exam. 2. Right lower lobe lung mass, highly suspicious for primary bronchogenic carcinoma. Along the cephalad aspect of the mass is airspace disease and concurrent infection or aspiration cannot be excluded. 3. Isolated mild lower mediastinal adenopathy could be reactive or represent nodal metastasis. 4. No acute process or evidence of metastatic disease in the abdomen or pelvis. 5. Nonobstructive small bowel containing right inguinal hernia. 6. Pulmonary artery enlargement suggests pulmonary arterial hypertension. 7. Aortic atherosclerosis (ICD10-I70.0), coronary artery atherosclerosis and emphysema (ICD10-J43.9). These results were called by telephone at the time of interpretation on 12/09/2022 at 9:16 am to provider Margaretmary Eddy , who verbally acknowledged these results. Electronically Signed   By: Abigail Miyamoto M.D.   On: 12/09/2022 09:16   CT Head Wo  Contrast  Result Date: 12/09/2022 CLINICAL DATA:  Mental status change, unknown cause. EXAM: CT HEAD WITHOUT CONTRAST TECHNIQUE: Contiguous axial images were obtained from the base of the skull through the vertex without intravenous contrast. RADIATION DOSE REDUCTION: This exam was performed according to the departmental dose-optimization program which includes automated exposure control, adjustment of the mA and/or kV according to patient size and/or use of iterative reconstruction technique. COMPARISON:  Head CT 10/04/2022. FINDINGS: Brain: No acute hemorrhage. Unchanged severe chronic small-vessel disease. 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 Cortical gray-white differentiation is otherwise preserved. Stable central pattern of volume loss. No extra-axial collection. Basilar cisterns are patent. Vascular: Unchanged atherosclerotic calcifications of the bilateral MCAs and basilar artery. Skull: No calvarial fracture or suspicious bone lesion. Skull base is unremarkable within limitations of mild motion artifact. Sinuses/Orbits: Paranasal sinuses, mastoid air cells, and middle ear cavities are well aerated. Orbits are unremarkable. Other: No scalp hematoma. IMPRESSION: 1. No acute intracranial abnormalities. 2. 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 Electronically Signed   By: Emmit Alexanders M.D.   On: 12/09/2022 09:00   DG Chest Portable 1 View  Result Date: 12/09/2022 CLINICAL DATA:  89 year old male with history of aspiration. Altered mental status. EXAM: PORTABLE CHEST 1 VIEW COMPARISON:  Chest x-ray 10/04/2022. FINDINGS: Lung volumes are low. Extensive interstitial prominence throughout the mid to lower lungs, suggesting a background of interstitial lung disease. There are also emphysematous changes in the upper lungs. Bibasilar opacities which may reflect areas of atelectasis and/or consolidation. Small bilateral pleural  effusions. Mild cardiomegaly. The patient is rotated to the right on today's exam, resulting in distortion of the mediastinal contours and reduced diagnostic sensitivity and specificity for mediastinal pathology. Atherosclerotic calcifications are noted in the thoracic aorta. IMPRESSION: 1. Low lung volumes with bibasilar areas of atelectasis and/or consolidation and superimposed small bilateral pleural effusions. 2. The appearance of the lungs is similar to prior studies, likely reflecting a background of emphysema and chronic interstitial lung disease. 3. Aortic atherosclerosis. 4. Mild cardiomegaly. Electronically Signed   By: Vinnie Langton M.D.   On: 12/09/2022 07:48    Procedures Procedures  Medications Ordered in ED Medications  enoxaparin (LOVENOX) injection 40 mg (0 mg Subcutaneous Hold 12/09/22 1042)  ondansetron (ZOFRAN) injection 4 mg (has no administration in time range)  cefTRIAXone (ROCEPHIN) 1 g in sodium chloride 0.9 % 100 mL IVPB (has no administration in time range)  azithromycin (ZITHROMAX) tablet 250 mg (has no administration in time range)  aspirin EC tablet 81 mg (81 mg Oral Given 12/09/22 1327)  atorvastatin (LIPITOR) tablet 20 mg (20 mg Oral Given 12/09/22 1326)  escitalopram (LEXAPRO) tablet 10 mg (10 mg Oral Given 12/09/22 1335)  albuterol (PROVENTIL) (2.5 MG/3ML) 0.083% nebulizer solution 3 mL (has no administration in time range)  divalproex (DEPAKOTE) DR tablet 125 mg (has no administration in time range)  fludrocortisone (FLORINEF) tablet 0.1 mg (has no administration in time range)  ondansetron (ZOFRAN) injection 4 mg (4 mg Intravenous Given 12/09/22 0735)  ceFEPIme (MAXIPIME) 2 g in sodium chloride 0.9 % 100 mL IVPB (0 g Intravenous Stopped 12/09/22 0914)  metroNIDAZOLE (FLAGYL) IVPB 500 mg (0 mg Intravenous Stopped 12/09/22 0953)  acetaminophen (TYLENOL) suppository 650 mg (650 mg Rectal Given 12/09/22 0914)  iohexol (OMNIPAQUE) 350 MG/ML injection 75 mL (75 mLs  Intravenous Contrast Given 12/09/22 0849)  lactated ringers bolus 500 mL ( Intravenous Stopped 12/09/22 1430)    ED Course/ Medical Decision Making/ A&P Clinical Course as of 12/09/22 1756  Mon Dec 09, 2022  0920 Discussed with Fredric Mare (niece) updated on pt status and lung mass. Took him off hospice 2 months ago since he had been on it and doing well. She is in [place] and there is no family here to help. She is his Scientist, research (medical).  Would like for him to be admitted for IV antibiotics.  Will consult palliative care care. [RP]  [phone] Spoke with Junie Panning from palliative care. Will let the providers on their team know. Unsure if they will be able to see today though.  [RP]  78 Spoke with Dr Tamsen Snider who will admit pt. [RP]    Clinical Course User Index [RP] Fransico Meadow, MD                            Medical Decision Making Amount and/or Complexity of Data Reviewed Labs: ordered. Radiology: ordered.  Risk OTC drugs. Prescription drug management. Decision regarding hospitalization.   PHU RECORD is a 89 y.o. male with comorbidities that complicate the patient evaluation including dementia, parkinson's, HTN, HLD, prostate cancer sp TURP, and COPD who presents with vomiting and altered mental status.   Initial Ddx:  Small bowel obstruction, intra-abdominal abscess/infection, ICH, pneumonia/URI, MI, sepsis  MDM:  Initially was concerned about possible intra-abdominal process given the patient's vomiting.  Does have a hernia in his right groin but it is reducible.  His altered mental status could reflect an [place] as well so we will obtain head imaging.  Does appear to be hypoxic at this time so we will evaluate him for pneumonia with a chest CT and COVID/flu.  Patient is febrile at this time so we will treat him with broad-spectrum antibiotics.  Plan:  Labs Lactate Troponin COVID and flu CT head CT chest, abdomen, pelvis Blood cultures Broad-spectrum  antibiotics  ED Summary/Re-evaluation:  Patient underwent the above workup which did show a large right-sided lung mass with possible superimposed infection.  No additional concerning findings were found aside from mildly elevated troponin which will be repeated.  Suspect that this is due  to demand.  Given his hypoxia will admit to medicine for further management of his suspected pneumonia.  Did call family and give him an update.  They stated that they wanted him to remain on palliative care and possibly evaluated for hospice.  Did reach out to our palliative care who will see the patient.  Patient was then admitted to the hospital for further management.  This patient presents to the ED for concern of complaints listed in HPI, this involves an extensive number of treatment options, and is a complaint that carries with it a high risk of complications and morbidity. Disposition including potential need for admission considered.   Dispo: DC Home. Return precautions discussed including, but not limited to, those listed in the AVS. Allowed pt time to ask questions which were answered fully prior to dc.  Additional history obtained from son Records reviewed Outpatient Clinic Notes The following labs were independently interpreted: Chemistry and show no acute abnormality I independently reviewed the following imaging with scope of interpretation limited to determining acute life threatening conditions related to emergency care: CT Head and agree with the radiologist interpretation with the following exceptions: none I personally reviewed and interpreted cardiac monitoring: sinus tachycardia I personally reviewed and interpreted the pt's EKG: see above for interpretation  I have reviewed the patients home medications and made adjustments as needed Consults:  Palliative care and hospitalist Social Determinants of health:  Elderly, dementia  Final Clinical Impression(s) / ED Diagnoses Final diagnoses:   Hypoxia  Lung mass  Pneumonia due to infectious organism, unspecified laterality, unspecified part of lung    Rx / DC Orders ED Discharge Orders     None         Fransico Meadow, MD 12/09/22 1756

## 2022-12-09 NOTE — H&P (Signed)
NAME:  Victor Clarke, MRN:  [id number], DOB:  26-Nov-1928, LOS: 0 ADMISSION DATE:  12/09/2022, Primary: Victor Clarke., MD  CHIEF COMPLAINT:  altered mental status   Medical Service: Internal Medicine Teaching Service         Attending Physician: Dr. Aldine Contes, MD    First Contact: Dr. Carin Primrose Pager: [phone]  Second Contact: Dr. Lisabeth Devoid Pager: [phone]       After Hours (After 5p/  First Contact Pager: [phone]  weekends / holidays): Second Contact Pager: [place]   Victor Clarke is 89yo person with advanced dementia, prostate cancer s/p TURP, COPD presenting to [facility] wiith vomiting and altered mental status. This morning Victor Clarke reports he feels at baseline and is unsure why he is here at the hospital. He does endorse mild non-productive cough, however does not state he feels unwell. Denies any current chest pain, dyspnea, abdominal pain, nausea, vomiting, diarrhea, dysuria, weakness, fevers. Per staff at [facility], patient has been less conversational recently and has had a few episodes of vomiting over the last few days.   PCP: Victor Clarke., MD  ED COURSE   In ED, patient febrile at 103F, hemodynamically stable on 4L nasal cannula. Initial labs revealed mildly elevated lactic acid and troponin, no leukocytosis. Imaging revealed right lower lobe lung mass with likely concurrent infection, no acute abdominal processes. Patient was initiated on IV antibiotics and IMTS subsequently consulted for admission for community acquired pneumonia.   PAST MEDICAL HISTORY   He,  has a past medical history of Alcoholic peripheral neuropathy ([place]), Allergy, Anemia, Arthritis, Cataract, COPD (chronic obstructive pulmonary disease) ([place]), Dehydration, Detached retina, Ectopic atrial tachycardia, EtOH dependence ([place]), Fall, Falls frequently (11/2018), Gout, Gout, Hypercholesteremia, Hypertension, Left inguinal hernia, Malnutrition  ([place]), Orthostatic hypotension, Parkinson's disease, and Prostate cancer ([place]).   HOME MEDICATIONS   Prior to Admission medications   Medication Sig Start Date End Date Taking? Authorizing Provider  acetaminophen (TYLENOL) 325 MG tablet Take 650 mg by mouth every 4 (four) hours as needed for fever.   Yes [provider]  aspirin EC 81 MG tablet Take 1 tablet (81 mg total) by mouth daily. 11/26/21  Yes Patrecia Pour, MD  atorvastatin (LIPITOR) 40 MG tablet Take 1 tablet (40 mg total) by mouth daily. Patient taking differently: Take 20 mg by mouth daily. 11/26/21  Yes Patrecia Pour, MD  BIOFREEZE 4 % GEL Apply 1 application topically 3 (three) times daily as needed (to affected sites).   Yes [provider]  cholecalciferol (VITAMIN D3) 25 MCG (1000 UT) tablet Take 1,000 Units by mouth daily.   Yes [provider]  divalproex (DEPAKOTE) 125 MG DR tablet Take 125 mg by mouth at bedtime.   Yes [provider]  Ensure (ENSURE) Take 237 mLs by mouth 3 (three) times daily between meals.   Yes [provider]  escitalopram (LEXAPRO) 10 MG tablet Take 10 mg by mouth at bedtime.   Yes [provider]  fludrocortisone (FLORINEF) 0.1 MG tablet Take 0.1 mg by mouth in the morning. 11/02/21  Yes [provider]  guaiFENesin-dextromethorphan (ROBITUSSIN DM) 100-10 MG/5ML syrup Take 10 mLs by mouth every 4 (four) hours as needed for cough. 10/18/19  Yes Shah, Pratik D, DO  LORazepam (ATIVAN) 0.5 MG tablet Take 0.5 tablets (0.25 mg total) by mouth in the morning and at bedtime. 11/26/21  Yes Patrecia Pour, MD  Magnesium  Oxide 250 MG TABS Take 500 mg by mouth at bedtime.   Yes [provider]  Multiple Vitamin (DAILY-VITE MULTIVITAMIN) TABS Take 1 tablet by mouth in the morning.   Yes [provider]  albuterol (PROVENTIL) (2.5 MG/3ML) 0.083% nebulizer solution Take 2.5 mg by nebulization every 6 (six) hours as needed for wheezing or  shortness of breath. Patient not taking: Reported on 12/09/2022    [provider]  albuterol (VENTOLIN HFA) 108 (90 Base) MCG/ACT inhaler Inhale 2 puffs into the lungs 3 (three) times daily. Patient not taking: Reported on 12/09/2022 10/18/19   Heath Lark D, DO  [place] Tar 20 % SOLN Apply 1 application topically See admin instructions. Shampoo the hair when bathing (re: Hospice) Patient not taking: Reported on 12/09/2022    [provider]  miconazole (MICOTIN) 2 % cream Apply 1 application topically daily as needed (rash, jock itch). Patient not taking: Reported on 12/09/2022    [provider]  ondansetron (ZOFRAN-ODT) 8 MG disintegrating tablet Take 8 mg by mouth every 8 (eight) hours. Patient not taking: Reported on 12/09/2022 11/26/22   [provider]  OXYGEN Inhale 2 L/min into the lungs as needed (for shortness of breath).    [provider]    ALLERGIES   Allergies as of 12/09/2022   (No Known Allergies)    SOCIAL HISTORY   Difficult to obtain 2/2 patient's mental status. Currently lives in [facility] and can reportedly dress himself. Needs assistance with most IADL's, including medications.   FAMILY HISTORY   His family history includes Heart attack in his brother and mother; Stroke in his brother and father. There is no history of Colon cancer, Esophageal cancer, Rectal cancer, Stomach cancer, or Prostate cancer.   REVIEW OF SYSTEMS   ROS per history of present illness.  PHYSICAL EXAMINATION   Blood pressure 119/64, pulse 74, temperature (!) 103.4 F (39.7 C), temperature source Rectal, resp. rate (!) 23, height '5\' 8"'$  (1.727 m), weight 72.6 kg, SpO2 97 %.    Filed Weights   12/09/22 0700  Weight: 72.6 kg   GENERAL: Elderly appearing person in no acute distress HENT: Normocephalic, atraumatic. Dry mucous membranes. Poor dentition. EYES: Vision grossly in tact. No conjunctival injection or scleral  icterus. CV: Regular rate, rhythm. 2/6 systolic murmur best heard at LUSB. Distal pulses 2+ bilaterally PULM: Normal work of breathing on 4L supp O2 via [place]. Decreased breath sounds in bilateral lower lung fields.  GI: Abdomen soft, non-tender, non-distended. Normoactive bowel sounds. MSK: Atrophied muscles throughout. No peripheral edema appreciated. SKIN: Warm, dry. No rashes or lesions appreciated.  NEURO: Awake, alert, oriented to self only. Grossly non-focal, no facial droop. Strength 4/5 throughout. Sensation in tact throughout.  PSYCH: Normal mood, affect, speech.   SIGNIFICANT DIAGNOSTIC TESTS   ECG: Sinus rhythm, no significant ST changes from previous.  I personally reviewed patient's ECG with my interpretation as above.  CXR: Rotated film, difficult to determine right heart border for potential aspiration. Bilateral hazy opacities likely representing atelectasis.   I personally reviewed patient's CXR with my interpretation as above.  CT CHEST/ABDOMEN/PELVIS: Right lower lobe lung mass, highly suspicious for primary bronchogenic carcinoma. Isolated mild lower mediastinal adenopathy could be reactive or nodal metastasis. No acute process or evidence of metastatic disease in abdomen or pelvis. Pulmonary artery enlargement suggests pulmonary artery hypertension. Aortic atherosclerosis, coronary artery atherosclerosis, and emphysema.    LABS      Latest Ref Rng & Units  12/09/2022    7:37 AM 12/09/2022    7:21 AM 10/04/2022    6:27 PM  CBC  WBC 4.0 - 10.5 K/uL  5.7  5.4   Hemoglobin 13.0 - 17.0 g/dL 13.0 - 17.0 g/dL 13.6    12.9  12.9  12.6   Hematocrit 39.0 - 52.0 % 39.0 - 52.0 % 40.0    38.0  40.8  37.9   Platelets 150 - 400 K/uL  173  205       Latest Ref Rng & Units 12/09/2022    7:37 AM 12/09/2022    7:21 AM 10/04/2022    6:27 PM  BMP  Glucose 70 - 99 mg/dL 130  135  104   BUN 8 - 23 mg/dL '17  17  18   '$ Creatinine 0.61 - 1.24 mg/dL 1.00  1.17  1.20   Sodium 135 -  145 mmol/L 135 - 145 mmol/L 141    141  140  137   Potassium 3.5 - 5.1 mmol/L 3.5 - 5.1 mmol/L 3.4    3.4  3.5  3.9   Chloride 98 - 111 mmol/L 103  104  103   CO2 22 - 32 mmol/L  24  26   Calcium 8.9 - 10.3 mg/dL  8.7  9.1     CONSULTS   na  ASSESSMENT   Ladarius Seubert is 89yo person with advanced dementia, prostate cancer s/p TURP, COPD admitted 1/22 with community acquired pneumonia and found to have new lung mass.  PLAN   Principal Problem:   Community acquired pneumonia  #Community acquired pneumonia Patient presenting febrile with increased oxygen requirement and cough with likely infectious changes on CT chest. There is mild increase in lactic acid and troponin however he remains hemodynamically stable, does not meet sepsis criteria. Vomiting most likely 2/2 to this, abdominal exam is benign and electrolytes are within normal limits. Of note, patient does have reported history of aspiration in the past. Patient was previously under hospice care but is just under palliative care at this time. ED provider discussed with niece listed in chart, who would like to continue with fluids and antibiotics for now. We will reach out to her as well. Patient initially received vancomycin, cefepime, and metronidazole for broad-spectrum antibiotics, will de-escalate.  - Continue with ceftriaxone, azithromycin - Trend lactic acid - Follow-up blood cultures - Zofran PRN - SLP for aspiration   #Elevated troponin Troponin initially elevated to 100, increased to 320 at next check. Patient is currently asymptomatic without chest pain or dyspnea. ECG without new ischemic changes present. Most likely demand related, will follow-up repeat troponin. - F/u repeat trop  #Advanced dementia On my assessment, patient appears to be at baseline, pleasant, oriented to self only. Patient likely previously more somnolent and confused 2/2 current infection. Electrolytes are within normal limits, no focal  deficits on neuro exam. - Delirium precautions  #New lung mass #History prostate cancer s/p TURP New lung mass thought to likely be primary bronchogenic carcinoma based on imaging findings. This also appears to be an isolated case of pneumonia, more likely related to aspiration event than obstructive. Patient is currently in palliative care and was previously under hospice care, will discuss with family regarding further work-up. However, given age and co-morbidities I do not think extensive work-up is warranted in this case.   #History of orthostatic hypotension - Continue home fludrocortisone  #COPD Patient does report previous history of smoking, ~20 pack-year history. Not on any maintenance inhalers  per MAR at facility. - Albuterol as needed  #History CVA - Continue daily aspirin '81mg'$  - Continue daily atorvastatin '20mg'$   #Depression #Anxiety - Continue daily escitalopram '10mg'$  - Continue daily Depakote '125mg'$  QHS  BEST PRACTICE   DIET: NPO pending SLP; aspiration precautions IVF: 500cc bolus LR DVT PPX: lovenox BOWEL: na CODE: DNR FAM COM: will speak with niece this afternoon  DISPO: Admit patient to Observation with expected length of stay less than 2 midnights.  Sanjuan Dame, MD Internal Medicine Resident PGY-3 Pager [phone] 12/09/2022 11:36 AM

## 2022-12-09 NOTE — ED Notes (Signed)
Room air oxygen saturation at 88%. Patient placed on 2LPM, oxygen increased to 92%, then placed on 4LPM, Oxygen increased to 96%.

## 2022-12-10 DIAGNOSIS — Z7189 Other specified counseling: Secondary | ICD-10-CM | POA: Diagnosis not present

## 2022-12-10 DIAGNOSIS — R918 Other nonspecific abnormal finding of lung field: Secondary | ICD-10-CM | POA: Diagnosis not present

## 2022-12-10 DIAGNOSIS — Z87891 Personal history of nicotine dependence: Secondary | ICD-10-CM | POA: Diagnosis not present

## 2022-12-10 DIAGNOSIS — J189 Pneumonia, unspecified organism: Secondary | ICD-10-CM | POA: Diagnosis not present

## 2022-12-10 DIAGNOSIS — R0902 Hypoxemia: Secondary | ICD-10-CM | POA: Diagnosis not present

## 2022-12-10 DIAGNOSIS — Z515 Encounter for palliative care: Secondary | ICD-10-CM | POA: Diagnosis not present

## 2022-12-10 DIAGNOSIS — Z66 Do not resuscitate: Secondary | ICD-10-CM | POA: Diagnosis not present

## 2022-12-10 LAB — CBC
HCT: 34.4 % — ABNORMAL LOW (ref 39.0–52.0)
Hemoglobin: 10.8 g/dL — ABNORMAL LOW (ref 13.0–17.0)
MCH: 27.8 pg (ref 26.0–34.0)
MCHC: 31.4 g/dL (ref 30.0–36.0)
MCV: 88.7 fL (ref 80.0–100.0)
Platelets: 168 10*3/uL (ref 150–400)
RBC: 3.88 MIL/uL — ABNORMAL LOW (ref 4.22–5.81)
RDW: 15.3 % (ref 11.5–15.5)
WBC: 4.9 10*3/uL (ref 4.0–10.5)
nRBC: 0 % (ref 0.0–0.2)

## 2022-12-10 LAB — BASIC METABOLIC PANEL
Anion gap: 8 (ref 5–15)
BUN: 22 mg/dL (ref 8–23)
CO2: 27 mmol/L (ref 22–32)
Calcium: 8.1 mg/dL — ABNORMAL LOW (ref 8.9–10.3)
Chloride: 104 mmol/L (ref 98–111)
Creatinine, Ser: 1.28 mg/dL — ABNORMAL HIGH (ref 0.61–1.24)
GFR, Estimated: 53 mL/min — ABNORMAL LOW (ref >60)
Glucose, Bld: 88 mg/dL (ref 70–99)
Potassium: 3.7 mmol/L (ref 3.5–5.1)
Sodium: 139 mmol/L (ref 135–145)

## 2022-12-10 MED ORDER — SODIUM CHLORIDE 0.9 % IV SOLN
2.0000 g | INTRAVENOUS | Status: DC
  Administered 2022-12-10: 2 g via INTRAVENOUS
  Filled 2022-12-10: qty 20

## 2022-12-10 MED ORDER — ATORVASTATIN CALCIUM 20 MG PO TABS: 20.0000 mg | ORAL_TABLET | Freq: Every day | ORAL | 1 refills | Status: AC

## 2022-12-10 MED ORDER — AZITHROMYCIN 250 MG PO TABS: 250.0000 mg | ORAL_TABLET | Freq: Every day | ORAL | 0 refills | Status: AC

## 2022-12-10 MED ORDER — CEFDINIR 300 MG PO CAPS: 300.0000 mg | ORAL_CAPSULE | Freq: Two times a day (BID) | ORAL | 0 refills | Status: AC

## 2022-12-10 NOTE — Progress Notes (Signed)
Daily Progress Note   Patient Name: Victor Clarke       Date: 12/10/2022 DOB: Nov 08, 1929  Age: 89 y.o. MRN#: [id number] Attending Physician: Victor Contes, MD Primary Care Physician: Victor Hess., MD Admit Date: 12/09/2022 Length of Stay: 0 days  Reason for Consultation/Follow-up: Establishing goals of care  HPI/Patient Profile:  89 y.o. male  with past medical history of advanced dementia, prostate cancer s/p TURP, COPD presenting to [facility] wiith vomiting and altered mental status. He was admitted on 12/09/2022 with community-acquired pneumonia, advanced dementia, new lung mass, COPD, and others.    Of note the patient was previously on hospice at his assisted living facility, recently graduated to palliative care.   PMT was consulted for Victor Clarke conversations.  Subjective:   Subjective: Chart Reviewed. Updates received. Patient Assessed. Created space and opportunity for patient  and family to explore thoughts and feelings regarding current medical situation.  Today's Discussion: Today I saw the patient at the bedside, where he was sitting edge of bed with his breakfast tray in front of.  I asked him if he remembered me coming to see him previously and he states no.  I again explained why I am.  He knows he is at [facility] and thinks it is still 2023.  When asked him why he is here after some time for processing he states "I think pneumonia or neurology."  He does not remember the lung mass.  He states he has not been sick on his stomach since he can remember (although that was the primary reason for him being admitted to the hospital).  We discussed his breakfast tray which he appears to have eaten 50% including grape juice and "what ever was in here" which I informed him was likely Jell-O.  He has not had his broth because it is cold.  I offered to have it warmed up but he states he probably still would need it.  He again stated that he had grape juice and could not name  what was in the other container that he had, which I reminded him again was Jell-O.  Overall he is not sure why he is still here.  He denies any dyspnea, pain, nausea, vomiting.  He asked when he would be able to go back to his "former residence" and I stated that the hospital doctor was reviewed by sometime today to discuss that with him.  I provided emotional and general support through therapeutic listening, empathy, sharing of stories, and other techniques. I answered all questions and addressed all concerns to the best of my ability.  After seeing the patient I messaged the social worker and the medical team.  They are anticipating possible discharge today but want to ensure that hospice is set up.  TOC is working on hospice arrangements.  Review of Systems  Respiratory:  Negative for cough, chest tightness and shortness of breath.   Cardiovascular:  Negative for chest pain.  Gastrointestinal:  Negative for abdominal pain, nausea and vomiting.    Objective:   Vital Signs:  BP (!) 118/48 (BP Location: Left Arm)   Pulse (!) 59   Temp 98.2 F (36.8 C)   Resp 19   Ht '5\' 8"'$  (1.727 m)   Wt 71.4 kg   SpO2 92%   BMI 23.93 kg/m   Physical Exam: Physical Exam Vitals and nursing note reviewed.  Constitutional:      General: He is not in acute distress. HENT:  Head: Normocephalic and atraumatic.  Cardiovascular:     Rate and Rhythm: Normal rate.  Pulmonary:     Effort: Pulmonary effort is normal. No respiratory distress.     Breath sounds: Normal breath sounds. No wheezing or rhonchi.  Abdominal:     General: Abdomen is flat.     Palpations: Abdomen is soft.  Skin:    General: Skin is warm and dry.  Neurological:     Mental Status: He is alert. He is disoriented.     Comments: Forgetful consistent with advanced dementia  Psychiatric:        Mood and Affect: Mood normal.        Behavior: Behavior normal.     Palliative Assessment/Data: 50-60%    Existing Vynca/ACP  Documentation: DNR (dated 12/09/2018)  Assessment & Plan:   Impression: Present on Admission:  Community acquired pneumonia  89 year old male with chronic comorbidities and acute presentation as described above. Essentially he is admitted for pneumonia and apparent new lung mass/cancer. He was previously on hospice for his advanced dementia but graduated recently to palliative care. After discussing with his HCPOA (given his lack of capacity due to significant dementia and memory problems) we decided to treat the treatable, notify Victor Clarke for any deep compensation to discuss whether or not escalation is desired, reinvolve hospice for when he is ready for discharge from the hospital. Overall long-term prognosis is poor.   SUMMARY OF RECOMMENDATIONS   Remain DNR Treated treatable Notify Victor Clarke (HCPOA/niece) of any clinical change possibly necessitating escalation of care Work toward discharge with hospice involvement Goals are clear PMT will chart check the patient for decompensation or further palliative needs  Symptom Management:  Per primary team PMT is available to assist as needed  Code Status: DNR  Prognosis: Unable to determine  Discharge Planning:  Back to LTC/ALF with hospice  Discussed with: Patient, family, medical team, nursing team, Victor Clarke team  Thank you for allowing [place] to participate in the care of Victor Clarke PMT will continue to support holistically.  Billing based on MDM: High  Problems Addressed: One acute or chronic illness or injury that poses a threat to life or bodily function  Amount and/or Complexity of Data: Category 3:Discussion of management or test interpretation with external physician/other qualified health care professional/appropriate source (not separately reported)  Risks: Decision not to resuscitate or to de-escalate care because of poor prognosis  Victor Field, NP Palliative Medicine Team  Team Phone # [phone] (Nights/Weekends)   07/17/2021, 8:17 AM

## 2022-12-10 NOTE — Hospital Course (Addendum)
Victor Clarke is a 89 year old man with pmhx significant for advanced dementia, prostate cancer s/p TURP, COPD, CVA who presented for altered mental status and vomiting and was admitted for community acquired pneumonia and new lung mass concerning for malignancy.  Community acquired pneumonia  Patient presented to the ED from [facility] for altered mental status of several days and emesis. Patient was febrile in the ED, CT A&P revealed new lung mass with concurrent infection vs. aspiration. Patient started on empiric antibiotics and remained stable during admission with no further febrile episodes or emesis.   New RLL lung mass - suspicious for bronchogenic carcinoma History prostate cancer s/p TURP Discussed new lung mass finding with patient and patient's HCPOA (niece [name]), both are in agreement to not pursue extensive diagnostic workup or treatment related to lung mass. Niece states she does not think that he would want to go through chemotherapy, surgery, radiation even if it was an option, at his age. Patient previously on hospice in 2020 after episode of pneumonia 2/2 COVID, was advanced to palliative care after improving. Given new lung mass findings, family will re-initiate hospice care. No further workup done during admission.   History of orthostatic hypotension Continued on home fludrocortisone.   History of COPD Patient reports previous ~20 pack-year history of smoking, not on any maintenance inhalers per [facility] at facility PTA.   History CVA Continued on daily aspirin '81mg'$  and atorvastatin '20mg'$ .   History of Depression/Anxiety Continued on daily escitalopram '10mg'$  and daily Depakote '125mg'$  QHS

## 2022-12-10 NOTE — Progress Notes (Signed)
Internal Medicine Attending:   I saw and examined the patient. I reviewed the resident's H&P note and I agree with the resident's findings and plan as documented in the resident's note.  In brief, patient is a 89 year old male with past medical history of moderate to advanced dementia, prostate cancer status post TURP, COPD who presented to the ED with vomiting and altered mental status from his assisted living facility.  Patient is unable to provide a good history and states that he feels well this morning.  History obtained from chart.  Per chart, patient has been less conversational over the last couple of days and has had a few episodes of vomiting as well.  This is concerning to the staff and they sent him into the ED for further evaluation.  In the ED, patient was noted to have a fever up to 103 F as well as some mild tachypnea up to 23.  He was noted to have a right lower lobe lung mass suspicious for bronchogenic carcinoma as well as associated airspace disease near the mass.  He was admitted to the hospital for evaluation of his pneumonia and lung mass.  Today, patient states that he feels well and denies any new complaints.  States that he does not have an appetite and would like to sleep but will attempt to eat later.  Vitals:   12/10/22 0540 12/10/22 0944  BP: (!) 134/59 (!) 118/48  Pulse: (!) 58 (!) 59  Resp: 18 19  Temp: (!) 97.5 F (36.4 C) 98.2 F (36.8 C)  SpO2: 97% 92%   On exam, patient is lying in bed in no apparent distress.  Cardiovascular exam reveals regular rate and rhythm with a 2 out of 6 systolic murmur best heard at the left upper sternal border.  No tachypnea noted.  Lungs are clear to auscultation bilaterally.  Abdomen is soft, nontender, nondistended with normoactive bowel sounds.  Lower extremities are nontender to palpation with no edema noted.  Patient has a normal mood and affect.  On neuro exam, patient is oriented to self but states 2023 when asked about the  year and knows that he is in [place] but did not know he was at [facility].  Assessment and plan:  1.  Community-acquired pneumonia/likely bronchogenic carcinoma: -Patient presented to ED with altered mental status (decreased conversations over the last few days) as well as nausea and vomiting and was noted to be febrile up to 103 F in the ED.  Imaging was consistent with likely right lower lobe airspace disease and associated lung mass likely bronchogenic carcinoma.  No leukocytosis on admission.  He was placed on oxygen initially but it is unclear if he was ever hypoxic.  His O2 sats are currently in the 90s on room air.  -Continue ceftriaxone and azithromycin for now -Blood cultures no growth today.  Will continue to monitor -Overall, patient appears to be improving.  Will continue to monitor closely -Patient does have a niece [name]) who appears to help him with his decisions.  Palliative care saw the patient yesterday and discussed the possibility of lung cancer with her.  At this point she would not want to pursue an aggressive workup for this. -Given patient now has underlying lung cancer he likely will qualify for hospice.  We will attempt to arrange for him to be on hospice at [facility] -Of note, patient does have a mildly increased creatinine today up to 1.28 likely secondary to dehydration in setting of decreased  oral intake.  If he is unable to take appropriate oral intake today we would likely need to initiate IV fluids for him -Patient also noted to have elevated troponin up to 685 in the setting of an active infection.  No acute ischemic changes on EKG and patient denying any chest pain.  Troponins are now downtrending.  This is likely demand ischemia in the setting of his infection.  No further workup required at this time especially given patient's and nieces desire to avoid aggressive workup. -Will continue to monitor closely

## 2022-12-10 NOTE — Plan of Care (Signed)
  Problem: Education: Goal: Knowledge of General Education information will improve Description: Including pain rating scale, medication(s)/side effects and non-pharmacologic comfort measures Outcome: Adequate for Discharge   Problem: Health Behavior/Discharge Planning: Goal: Ability to manage health-related needs will improve Outcome: Adequate for Discharge   Problem: Clinical Measurements: Goal: Ability to maintain clinical measurements within normal limits will improve Outcome: Adequate for Discharge Goal: Will remain free from infection Outcome: Adequate for Discharge Goal: Diagnostic test results will improve Outcome: Adequate for Discharge Goal: Respiratory complications will improve Outcome: Adequate for Discharge Goal: Cardiovascular complication will be avoided Outcome: Adequate for Discharge   Problem: Activity: Goal: Risk for activity intolerance will decrease Outcome: Adequate for Discharge   Problem: Nutrition: Goal: Adequate nutrition will be maintained Outcome: Adequate for Discharge   Problem: Coping: Goal: Level of anxiety will decrease Outcome: Adequate for Discharge   Problem: Elimination: Goal: Will not experience complications related to bowel motility Outcome: Adequate for Discharge Goal: Will not experience complications related to urinary retention Outcome: Adequate for Discharge   Problem: Pain Managment: Goal: General experience of comfort will improve Outcome: Adequate for Discharge   Problem: Safety: Goal: Ability to remain free from injury will improve Outcome: Adequate for Discharge   Problem: Skin Integrity: Goal: Risk for impaired skin integrity will decrease Outcome: Adequate for Discharge   Problem: SLP Dysphagia Goals Goal: Patient will utilize recommended strategies Description: Patient will utilize recommended strategies during swallow to increase swallowing safety with Outcome: Adequate for Discharge

## 2022-12-10 NOTE — Progress Notes (Signed)
DISCHARGE NOTE SNF [name] to be discharged  [facility]  per MD order. Patient verbalized understanding.  Skin clean, dry and intact without evidence of skin break down, no evidence of skin tears noted. IV catheter discontinued intact. Site without signs and symptoms of complications. Dressing and pressure applied. Pt denies pain at the site currently. No complaints noted.  Patient free of lines, drains, and wounds.   Discharge packet assembled. An After Visit Summary (AVS) was printed and given to the EMS personnel. Patient escorted via stretcher and discharged to [facility] via ambulance. Report called to accepting facility; all questions and concerns addressed.   Viviano Simas, RN

## 2022-12-10 NOTE — Care Management Obs Status (Signed)
[facility] NOTIFICATION   Patient Details  Name: Victor Clarke MRN: [id number] Date of Birth: 05-05-1929   Medicare Observation Status Notification Given:  Yes    Tom-Johnson, Renea Ee, RN 12/10/2022, 2:45 PM

## 2022-12-10 NOTE — Discharge Summary (Addendum)
Name: Victor Clarke MRN: [id number] DOB: 1929-07-13 89 y.o. PCP: Sherald Hess., MD  Date of Admission: 12/09/2022  7:02 AM Date of Discharge: 12/10/2022 Attending Physician: Aldine Contes, MD  Discharge Diagnosis: 1. Principal Problem:   Pneumonia due to infectious organism Active Problems:   Lung mass   Discharge Medications: Allergies as of 12/10/2022   No Known Allergies      Medication List     STOP taking these medications    albuterol (2.5 MG/3ML) 0.083% nebulizer solution Commonly known as: PROVENTIL   albuterol 108 (90 Base) MCG/ACT inhaler Commonly known as: VENTOLIN HFA   Coal Tar 20 % Soln   guaiFENesin-dextromethorphan 100-10 MG/5ML syrup Commonly known as: ROBITUSSIN DM   miconazole 2 % cream Commonly known as: MICOTIN   ondansetron 8 MG disintegrating tablet Commonly known as: ZOFRAN-ODT   OXYGEN       TAKE these medications    acetaminophen 325 MG tablet Commonly known as: TYLENOL Take 650 mg by mouth every 4 (four) hours as needed for fever.   aspirin EC 81 MG tablet Take 1 tablet (81 mg total) by mouth daily.   atorvastatin 20 MG tablet Commonly known as: LIPITOR Take 1 tablet (20 mg total) by mouth daily. What changed:  medication strength how much to take   azithromycin 250 MG tablet Commonly known as: ZITHROMAX Take 1 tablet (250 mg total) by mouth daily. Start taking on: December 11, 2022   Biofreeze 4 % Gel Generic drug: Menthol (Topical Analgesic) Apply 1 application topically 3 (three) times daily as needed (to affected sites).   cefdinir 300 MG capsule Commonly known as: OMNICEF Take 1 capsule (300 mg total) by mouth 2 (two) times daily.   cholecalciferol 25 MCG (1000 UNIT) tablet Commonly known as: VITAMIN D3 Take 1,000 Units by mouth daily.   Daily-Vite Multivitamin Tabs Take 1 tablet by mouth in the morning.   divalproex 125 MG DR tablet Commonly known as: DEPAKOTE Take 125 mg by mouth at  bedtime.   Ensure Take 237 mLs by mouth 3 (three) times daily between meals.   escitalopram 10 MG tablet Commonly known as: LEXAPRO Take 10 mg by mouth at bedtime.   fludrocortisone 0.1 MG tablet Commonly known as: FLORINEF Take 0.1 mg by mouth in the morning.   LORazepam 0.5 MG tablet Commonly known as: ATIVAN Take 0.5 tablets (0.25 mg total) by mouth in the morning and at bedtime.   Magnesium Oxide 250 MG Tabs Take 500 mg by mouth at bedtime.        Disposition and follow-up:   Mr.Nilo A Heinzman was discharged from [facility] in Stable condition.  At the hospital follow up visit please address:  1.  Community acquired pneumonia       Consider follow-up imaging if patient develops symptoms of pneumonia. Patient started empiric IV antibiotics during admission, discharged with 3 days of cefdinir and 2 days of azithromycin to finish course.   2.  Labs / imaging needed at time of follow-up: None  3.  Pending labs/ test needing follow-up: None  Follow-up Appointments:  Follow-up Information     Sherald Hess., MD Follow up.   Specialty: Family Medicine Why: As needed Contact information: [place]  [id number] [phone]                As needed with PCP  Hospital Course by problem list:  Victor Clarke is a 89  year old man with pmhx significant for advanced dementia, prostate cancer s/p TURP, COPD, CVA who presented for altered mental status and vomiting and was admitted for community acquired pneumonia and new lung mass concerning for malignancy.  Community acquired pneumonia  Patient presented to the ED from [facility] for altered mental status of several days and emesis. Patient was febrile in the ED, CT A&P revealed new lung mass with concurrent infection vs. aspiration. Patient started on empiric antibiotics and remained stable during admission with no further febrile episodes or emesis.   New RLL lung mass  - suspicious for bronchogenic carcinoma History prostate cancer s/p TURP Discussed new lung mass finding with patient and patient's HCPOA (niece [name]), both are in agreement to not pursue extensive diagnostic workup or treatment related to lung mass. Niece states she does not think that he would want to go through chemotherapy, surgery, radiation even if it was an option, at his age. Patient previously on hospice in 2020 after episode of pneumonia 2/2 COVID, was advanced to palliative care after improving. Given new lung mass findings, family will re-initiate hospice care. No further workup done during admission.   History of orthostatic hypotension Continued on home fludrocortisone.   History of COPD Patient reports previous ~20 pack-year history of smoking, not on any maintenance inhalers per [facility] at facility PTA. Briefly on 2L [place] for comfort, no respiratory distress during admission.    History CVA Continued on daily aspirin '81mg'$  and atorvastatin '20mg'$ .   History of Depression/Anxiety Continued on daily escitalopram '10mg'$  and daily Depakote '125mg'$  QHS  Discharge Exam:   BP (!) 118/48 (BP Location: Left Arm)   Pulse (!) 59   Temp 98.2 F (36.8 C)   Resp 19   Ht '5\' 8"'$  (1.727 m)   Wt 71.4 kg   SpO2 92%   BMI 23.93 kg/m   Physical Exam General: Alert, in no acute distress. CV: RRR, no m/r/g Pulm: No respiratory distress, lungs CTA bilaterally Skin: Warm and dry Ext: No LE edema Neuro: Alert, oriented only to self and city. Not oriented to location (hospital), time, or situation.  Psych: Pleasant and conversational   Pertinent Labs, Studies, and Procedures:     Latest Ref Rng & Units 12/10/2022    6:54 AM 12/09/2022    7:37 AM 12/09/2022    7:21 AM  CBC  WBC 4.0 - 10.5 K/uL 4.9   5.7   Hemoglobin 13.0 - 17.0 g/dL 10.8  13.6    12.9  12.9   Hematocrit 39.0 - 52.0 % 34.4  40.0    38.0  40.8   Platelets 150 - 400 K/uL 168   173        Latest Ref Rng & Units 12/10/2022     6:54 AM 12/09/2022    7:37 AM 12/09/2022    7:21 AM  BMP  Glucose 70 - 99 mg/dL 88  130  135   BUN 8 - 23 mg/dL '22  17  17   '$ Creatinine 0.61 - 1.24 mg/dL 1.28  1.00  1.17   Sodium 135 - 145 mmol/L 139  141    141  140   Potassium 3.5 - 5.1 mmol/L 3.7  3.4    3.4  3.5   Chloride 98 - 111 mmol/L 104  103  104   CO2 22 - 32 mmol/L 27   24   Calcium 8.9 - 10.3 mg/dL 8.1   8.7      CT CHEST ABDOMEN  PELVIS W CONTRAST 12/09/2022 IMPRESSION: 1. Motion and patient arm position degraded exam. 2. Right lower lobe lung mass, highly suspicious for primary bronchogenic carcinoma. Along the cephalad aspect of the mass is airspace disease and concurrent infection or aspiration cannot be excluded. 3. Isolated mild lower mediastinal adenopathy could be reactive or represent nodal metastasis. 4. No acute process or evidence of metastatic disease in the abdomen or pelvis. 5. Nonobstructive small bowel containing right inguinal hernia. 6. Pulmonary artery enlargement suggests pulmonary arterial hypertension. 7. Aortic atherosclerosis (ICD10-I70.0), coronary artery atherosclerosis and emphysema (ICD10-J43.9).   Discharge Instructions: Discharge Instructions     Call MD for:  difficulty breathing, headache or visual disturbances   Complete by: As directed    Call MD for:  temperature >100.4   Complete by: As directed    Diet - low sodium heart healthy   Complete by: As directed    Increase activity slowly   Complete by: As directed        Signed: Spero Curb, Medical Student 12/10/2022, 3:49 PM   Pager: [phone]  Attestation for Student Documentation:  I personally was present and performed or re-performed the history, physical exam and medical decision-making activities of this service and have verified that the service and findings are accurately documented in the student's note.  Nani Gasser MD 12/10/2022, 3:50 PM

## 2022-12-10 NOTE — Discharge Instructions (Addendum)
Mr. Victor Clarke  You were treated in the hospital for pneumonia. The results of your evaluation showed a mass in your right lung that is concerning for lung cancer. Based on our conversations with you and your family, we will pursue a treatment plan that focuses on comfort and quality of life rather than prolonging life. We are discharging you now that you are doing better. To help assist you on your road to recovery, I have written the following recommendations:   Start taking: - Azithromycin 250 mg p.o. daily through 12/12/2022 - Cefdinir 300 mg by mouth twice daily through 12/13/2022  It was a privilege to be a part of your hospital care team, and I hope you feel better as a result of your stay.  All the best, Nani Gasser, MD

## 2022-12-10 NOTE — TOC Progression Note (Signed)
Transition of Care [facility]) - Initial/Assessment Note    Patient Details  Name: Victor Clarke MRN: [id number] Date of Birth: 27-Jun-1929  [facility]) CM/SW Contact:    Milinda Antis, [place] Phone Number: 12/10/2022, 8:31 AM  Clinical Narrative:                 LCSW was notified by [facility] that they are the preferred palliative/hospice provider with [place] where the patient resides.  TOC following.          Patient Goals and CMS Choice            Expected Discharge Plan and Services                                              Prior Living Arrangements/Services                       Activities of Daily Living Home Assistive Devices/Equipment: Gilford Rile (specify type) ADL Screening (condition at time of admission) Patient's cognitive ability adequate to safely complete daily activities?: No Is the patient deaf or have difficulty hearing?: Yes Does the patient have difficulty seeing, even when wearing glasses/contacts?: No Does the patient have difficulty concentrating, remembering, or making decisions?: Yes Patient able to express need for assistance with ADLs?: Yes Does the patient have difficulty dressing or bathing?: No Independently performs ADLs?: Yes (appropriate for developmental age) Does the patient have difficulty walking or climbing stairs?: Yes Weakness of Legs: Both Weakness of Arms/Hands: None  Permission Sought/Granted                  Emotional Assessment              Admission diagnosis:  Lung mass [R91.8] Community acquired pneumonia [J18.9] Hypoxia [R09.02] Patient Active Problem List   Diagnosis Date Noted   Community acquired pneumonia 12/09/2022   Cerebellar stroke, acute ([place]) 11/22/2021   Acute CVA (cerebrovascular accident) ([place]) 11/22/2021   COPD (chronic obstructive pulmonary disease) ([place])    Pneumonia due to COVID-19 virus 10/14/2019   Hyponatremia 10/14/2019   Dementia without  behavioral disturbance ([place]) 01/05/2019   Problematic consumption of alcohol 12/01/2018   Tooth impaction 12/01/2018   Ataxia 12/01/2018   First degree AV block    Hypotension due to hypovolemia    Syncope 06/18/2018   Leukopenia 06/18/2018   AV block, [place] II 06/18/2018   Memory loss 10/22/2016   Malnutrition of moderate degree 11/15/2015   Fall from steps 11/07/2015   Anemia 11/07/2015   Alcohol dependence ([place]) 11/07/2015   Pyuria 11/07/2015   Dehydration 08/08/2015   UTI (urinary tract infection) 08/08/2015   High anion gap metabolic acidosis 70/11/7492   Dizziness 07/31/2015   Orthostatic hypotension 07/31/2015   Lactic acidosis 07/31/2015   Ectopic atrial tachycardia    Hypercholesteremia    Dyspnea on exertion 09/16/2014   Hypertension 09/16/2014   PCP:  Sherald Hess., MD Pharmacy:   [facility], [place] [address] [place] [address] Phone: [phone] Fax: [phone]     Social Determinants of Health (SDOH) Social History: SDOH Screenings   Food Insecurity: No Food Insecurity (12/09/2022)  Housing: Low Risk  (12/09/2022)  Transportation Needs: No Transportation Needs (12/09/2022)  Utilities: Not At Risk (12/09/2022)  Tobacco Use:  Medium Risk (12/09/2022)   SDOH Interventions:     Readmission Risk Interventions     No data to display

## 2022-12-10 NOTE — TOC Transition Note (Signed)
Transition of Care [facility]) - CM/SW Discharge Note   Patient Details  Name: Victor Clarke MRN: [id number] Date of Birth: [birth year]  [facility]) CM/SW Contact:  Milinda Antis, [place] Phone Number: 12/10/2022, 4:24 PM   Clinical Narrative:    Patient will DC to:  [facility] Anticipated DC date:  12/10/2022 Family notified:  Yes, patient's niece Rhona Raider by: [name]   Per MD patient ready for DC to ALF.  RN, patient's family, and facility notified of DC.  LCSW contacted the facility and was informed by Otila Kluver that the patient can return with hard scripts of new medication.  Freddie Breech with [facility] is delivering DME to the facility as [facility] will be providing hospice care. DC packet on chart. Ambulance transport requested for patient.   CSW will sign off for now as social work intervention is no longer needed. Please consult [place] again if new needs arise.    Final next level of care: Assisted Living Barriers to Discharge: No Barriers Identified   Patient Goals and CMS Choice      Discharge Placement                  Patient to be transferred to facility by: [place] Name of family member notified: Courson,[name] (Niece) [phone] Patient and family notified of of transfer: 12/10/22  Discharge Plan and Services Additional resources added to the After Visit Summary for                                       Social Determinants of Health (SDOH) Interventions SDOH Screenings   Food Insecurity: No Food Insecurity (12/09/2022)  Housing: Low Risk  (12/09/2022)  Transportation Needs: No Transportation Needs (12/09/2022)  Utilities: Not At Risk (12/09/2022)  Tobacco Use: Medium Risk (12/09/2022)     Readmission Risk Interventions     No data to display

## 2022-12-14 LAB — CULTURE, BLOOD (ROUTINE X 2)
Culture: NO GROWTH
Culture: NO GROWTH
Special Requests: ADEQUATE

## 2023-10-19 DEATH — deceased
